# Patient Record
Sex: Male | Born: 1975 | Race: White | Hispanic: No | Marital: Married | Smoking: Former smoker
Health system: Southern US, Community
[De-identification: ages and names within clinical notes are randomized; demographics above are authoritative.]

## PROBLEM LIST (undated history)

## (undated) DIAGNOSIS — R569 Unspecified convulsions: Secondary | ICD-10-CM

## (undated) DIAGNOSIS — R1310 Dysphagia, unspecified: Secondary | ICD-10-CM

## (undated) DIAGNOSIS — I639 Cerebral infarction, unspecified: Secondary | ICD-10-CM

## (undated) DIAGNOSIS — S46009A Unspecified injury of muscle(s) and tendon(s) of the rotator cuff of unspecified shoulder, initial encounter: Secondary | ICD-10-CM

## (undated) DIAGNOSIS — S069X9A Unspecified intracranial injury with loss of consciousness of unspecified duration, initial encounter: Secondary | ICD-10-CM

## (undated) DIAGNOSIS — R51 Headache: Secondary | ICD-10-CM

## (undated) DIAGNOSIS — S069XAA Unspecified intracranial injury with loss of consciousness status unknown, initial encounter: Secondary | ICD-10-CM

---

## 2005-02-14 ENCOUNTER — Ambulatory Visit (HOSPITAL_COMMUNITY): Admission: RE | Admit: 2005-02-14 | Discharge: 2005-02-14 | Payer: Self-pay | Admitting: Otolaryngology

## 2005-02-14 ENCOUNTER — Ambulatory Visit (HOSPITAL_BASED_OUTPATIENT_CLINIC_OR_DEPARTMENT_OTHER): Admission: RE | Admit: 2005-02-14 | Discharge: 2005-02-14 | Payer: Self-pay | Admitting: Otolaryngology

## 2005-02-14 ENCOUNTER — Encounter (INDEPENDENT_AMBULATORY_CARE_PROVIDER_SITE_OTHER): Payer: Self-pay | Admitting: *Deleted

## 2005-03-09 ENCOUNTER — Encounter: Admission: RE | Admit: 2005-03-09 | Discharge: 2005-03-09 | Payer: Self-pay | Admitting: Family Medicine

## 2006-09-11 HISTORY — PX: WRIST SURGERY: SHX841

## 2007-04-01 ENCOUNTER — Encounter: Admission: RE | Admit: 2007-04-01 | Discharge: 2007-04-01 | Payer: Self-pay | Admitting: *Deleted

## 2007-07-16 ENCOUNTER — Ambulatory Visit (HOSPITAL_BASED_OUTPATIENT_CLINIC_OR_DEPARTMENT_OTHER): Admission: RE | Admit: 2007-07-16 | Discharge: 2007-07-16 | Payer: Self-pay | Admitting: Orthopedic Surgery

## 2011-01-24 NOTE — Op Note (Signed)
NAMENINA, HOAR                 ACCOUNT NO.:  [id number]   MEDICAL RECORD NO.:  [id number]          PATIENT TYPE:  AMB   LOCATION:  DSC                          FACILITY:  [facility]   PHYSICIAN:  Cindee Salt, M.D.       DATE OF BIRTH:  1976/01/03   DATE OF PROCEDURE:  07/16/2007  DATE OF DISCHARGE:                               OPERATIVE REPORT   PREOPERATIVE DIAGNOSIS:  Ulnocarpal abutment, scapholunate ligament  tear, right wrist.   POSTOPERATIVE DIAGNOSIS:  Ulnocarpal abutment, scapholunate ligament  tear, right wrist, articular cartilage avulsion ulnar lunate with  significant synovitis.   OPERATION:  Arthroscopy right wrist with shrinkage scapholunate  ligament, debridement with abrasion arthroplasty, lunate articular  cartilage avulsion, partial synovectomy, ulnar shortening osteotomy with  Tri-Med plate open, right forearm and wrist.   SURGEON:  Cindee Salt, M.D.   ANESTHESIA:  Axillary block.   HISTORY:  The patient is a 35 year old male with a history of pain in  his wrist ulnar side.  He has had an MRI done revealing a scapholunate  ligament disruption.  He has a long ulna with ulnar-sided wrist pain and  positive bone scan.  He has elected to proceed with arthroscopy,  debridement shrinkage, ulnar shortening osteotomy as an open procedure  in that his triangular fibrocartilage complex is intact.  He is aware  that there is no guarantee with the surgery, possibility of infection,  recurrence, injury to arteries, nerves, tendons, incomplete relief of  symptoms, dystrophy, nonunion, delayed union to the osteotomy site with  the necessity of further surgical intervention.  Pre, peri and  postoperative course have been discussed, questions encouraged and  answered.  In the preoperative area the patient is seen.  The extremity  marked by both the patient and surgeon.   PROCEDURE:  The patient is brought to the operating room where an  axillary block was carried out without  difficulty.  He was prepped using  DuraPrep, supine position, right arm free.  The limb was placed in the  arthroscopy tower, 10 pounds traction applied.  The joint inflated at  the 3/4 portal.  A transverse incision was made, deepened with a  hemostat.  A blunt trocar was used to enter the joint.  The joint was  inspected.  A small scapholunate ligament disruption was noted with some  patulous nature to the scapholunate ligament.  The volar radial wrist  ligaments were intact.  Cartilage showed no significant wear.  An  irrigation catheter was placed in 6U.  The scope was brought over to the  ulnar side.  A 4/5 portal was opened after localization with a 22 gauge  needle.  While the needle was in, it was noted that there was a large  avulsion cartilage from the ulnar aspect of the lunate.  This was  probed.  It did not involve the lunotriquetral ligament, but abutted to  this.  This was then debrided after the widening this portal with a  hemostat.  Blunt trocar used to enter the joint.  A full radius shaver  was then used  to debride the loose cartilage.  An abrasion arthroplasty  was performed.  A significant ulnar synovitis was present.  Triangular  fibrocartilage complex was intact.  The TFCC showed normal trampoline  effect.  The joint was then inspected from the 4/5 portal.  No further  lesions were identified.  A partial synovectomy was then performed with  a Arthrowand.  The scope reintroduced into the 3-4/4-5 portal  alternating with the Arthrowand.  A shrinkage of the scapholunate  ligament was done after debridement of the small tear.  The midcarpal  joint was inspected.  No gross instability was noted.  No articular  changes were present.  A type 2 lunate was present.  There were no  changes on the proximal hamate.  The scope was removed.  The limb was  then exsanguinated with an Esmarch bandage, tourniquet placed high on  the arm was inflated to 250 mmHg.  An incision was then  made over the  lateral border of the forearm, the juncture between the flexor carpi  ulnaris, extensor carpi ulnaris carried down through subcutaneous  tissue.  The dorsal sensory branch identified, the ulnar nerve was not  identified.  It was searched for, retractors were placed.  An incision  was then made down to the periosteum.  The flexor carpi ulnaris was then  elevated off, a Tri-Med plate was then fixed to the ulna.  This was  fixed with the static three screws, these each measured 14 mm.  The  gliding screw was then placed, the jig was then inserted.  The gliding  screw was also 14 mm. The rotation jig was then applied.  Pins placed to  stabilize it.  The osteotomy jig was then inserted and a 3 mm osteotomy  was then performed.  The removal of the bone fragment allowed  compression after placement of a lateral pin and placement of the  compression guide.  This allowed compression of the osteotomy site after  loosening the gliding screw.  This was then tightened.  The oblique  screw was then inserted.  This was found to be a 20-mm screw.  A  partially threaded compression screw was then placed.  This was firmly  snugged after loosening the gliding screw allowing the osteotomy plate  to glide proximally.  X-rays confirmed positioning of the osteotomy site  after tightening of the gliding screw to maintain the plate in position.  The remaining two screws were then placed.  These each measured 14 mm.  The osteotomy was done while cooling with constant irrigation of saline  to prevent any burning of bone.  X-rays confirmed good positioning of  the osteotomy site of the compression screw, with shortening of the ulna  to a 1 mm step off with a 1 mm shortening of the ulna as compared the  radius.  The wound was copiously irrigated with saline.  The rotation  pins were removed along with the compression device.  The periosteum was  sutured with 2-0 Vicryl suture along with the fascia, the  subcutaneous  tissue.  The skin was closed with interrupted 4-0 Vicryl Rapide sutures  as were the portals for the arthroscopy.  A sterile compressive dressing  and long-arm splint applied.  On deflation of the tourniquet, all  fingers immediately pinked.  He was taken to the recovery for  observation in satisfactory condition.  Bleeders were cauterized  throughout the procedure with bipolar.  The patient tolerated the  procedure well.  He will be  admitted for overnight stay for pain  control.  He will be discharged on Percocet.           ______________________________  Cindee Salt, M.D.     GK/MEDQ  D:  07/16/2007  T:  07/17/2007  Job:  [id number]

## 2011-01-27 NOTE — Op Note (Signed)
[name], [name]                 ACCOUNT NO.:  [id number]   MEDICAL RECORD NO.:  [id number]          PATIENT TYPE:  AMB   LOCATION:  DSC                          FACILITY:  [facility]   PHYSICIAN:  Christopher E. Ezzard Standing, M.D.DATE OF BIRTH:  1975/11/22   DATE OF PROCEDURE:  02/14/2005  DATE OF DISCHARGE:                                 OPERATIVE REPORT   PREOPERATIVE DIAGNOSIS:  Left vocal cord nodule/polyp.   POSTOPERATIVE DIAGNOSIS:  Left vocal cord nodule/polyp.   OPERATION/PROCEDURE:  Microlaryngoscopy with excision of left vocal cord  polyp.   SURGEON:  Dr. Narda Bonds.   ANESTHESIA:  General endotracheal.   COMPLICATIONS:  None.   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  On exam in the office, he has  an erythematous mucosal-covered polyp or nodule on the anterior left true  vocal cord.  He is taken to the operating room at this time for  microlaryngoscopy and excision of vocal cord nodule.   DESCRIPTION OF PROCEDURE:  After adequate endotracheal anesthesia, the  anterior commissure laryngoscope was used to visualize the larynx.  The base  of the tongue and epiglottis were normal in appearance.  On examination of  the vocal cords, [name] had a small mucosal-covered, erythematous nodule  involving the very anterior left true vocal cord.  The right true vocal cord  appeared normal.  Using cup forceps and scissors, the nodule was removed and  sent to pathology.  Hemostasis was obtained with cotton pledgets soaked in  Adrenalin.  Photos were obtained.  This completed the procedure.  [name]  was subsequently awoken from anesthesia and transferred to the recovery room  postoperatively doing well.   DISPOSITION:  [name] is discharged home later this morning on Tylenol and  Tylenol #3 p.r.n. pain.  He is given Nexium 40 mg daily for two weeks,  instructed on voice rest for the next two weeks and  will have him follow up  in my office in two to three weeks for recheck.      CEN/MEDQ  D:  02/14/2005  T:  02/14/2005  Job:  [id number]   cc:   Dellis Anes. Idell Pickles, M.D.  [address]  [place]  [place] [address]  Fax: [phone]

## 2011-06-20 LAB — POCT HEMOGLOBIN-HEMACUE
Hemoglobin: 16.2
Operator id: 116011

## 2012-12-14 ENCOUNTER — Inpatient Hospital Stay (HOSPITAL_COMMUNITY)
Admission: EM | Admit: 2012-12-14 | Discharge: 2013-01-03 | DRG: 877 | Disposition: A | Discharge status: Rehabilitation Facility | Payer: BC Managed Care – PPO | Attending: General Surgery | Admitting: General Surgery

## 2012-12-14 ENCOUNTER — Emergency Department (HOSPITAL_COMMUNITY): Payer: BC Managed Care – PPO

## 2012-12-14 DIAGNOSIS — S02402A Zygomatic fracture, unspecified, initial encounter for closed fracture: Secondary | ICD-10-CM

## 2012-12-14 DIAGNOSIS — S065XAA Traumatic subdural hemorrhage with loss of consciousness status unknown, initial encounter: Secondary | ICD-10-CM

## 2012-12-14 DIAGNOSIS — S0993XA Unspecified injury of face, initial encounter: Secondary | ICD-10-CM

## 2012-12-14 DIAGNOSIS — R780 Finding of alcohol in blood: Secondary | ICD-10-CM | POA: Diagnosis present

## 2012-12-14 DIAGNOSIS — E876 Hypokalemia: Secondary | ICD-10-CM | POA: Diagnosis present

## 2012-12-14 DIAGNOSIS — I4891 Unspecified atrial fibrillation: Secondary | ICD-10-CM | POA: Diagnosis not present

## 2012-12-14 DIAGNOSIS — S066X1A Traumatic subarachnoid hemorrhage with loss of consciousness of 30 minutes or less, initial encounter: Secondary | ICD-10-CM

## 2012-12-14 DIAGNOSIS — J14 Pneumonia due to Hemophilus influenzae: Secondary | ICD-10-CM | POA: Diagnosis not present

## 2012-12-14 DIAGNOSIS — I469 Cardiac arrest, cause unspecified: Secondary | ICD-10-CM | POA: Diagnosis present

## 2012-12-14 DIAGNOSIS — S098XXA Other specified injuries of head, initial encounter: Secondary | ICD-10-CM

## 2012-12-14 DIAGNOSIS — E872 Acidosis, unspecified: Secondary | ICD-10-CM | POA: Diagnosis present

## 2012-12-14 DIAGNOSIS — Y92009 Unspecified place in unspecified non-institutional (private) residence as the place of occurrence of the external cause: Secondary | ICD-10-CM

## 2012-12-14 DIAGNOSIS — S0280XA Fracture of other specified skull and facial bones, unspecified side, initial encounter for closed fracture: Secondary | ICD-10-CM | POA: Diagnosis present

## 2012-12-14 DIAGNOSIS — S0292XA Unspecified fracture of facial bones, initial encounter for closed fracture: Secondary | ICD-10-CM

## 2012-12-14 DIAGNOSIS — R7309 Other abnormal glucose: Secondary | ICD-10-CM | POA: Diagnosis present

## 2012-12-14 DIAGNOSIS — I609 Nontraumatic subarachnoid hemorrhage, unspecified: Secondary | ICD-10-CM

## 2012-12-14 DIAGNOSIS — E870 Hyperosmolality and hypernatremia: Secondary | ICD-10-CM | POA: Diagnosis present

## 2012-12-14 DIAGNOSIS — J1569 Pneumonia due to other gram-negative bacteria: Secondary | ICD-10-CM | POA: Diagnosis not present

## 2012-12-14 DIAGNOSIS — J156 Pneumonia due to other aerobic Gram-negative bacteria: Secondary | ICD-10-CM | POA: Diagnosis not present

## 2012-12-14 DIAGNOSIS — R4182 Altered mental status, unspecified: Secondary | ICD-10-CM

## 2012-12-14 DIAGNOSIS — S0219XA Other fracture of base of skull, initial encounter for closed fracture: Secondary | ICD-10-CM

## 2012-12-14 DIAGNOSIS — IMO0002 Reserved for concepts with insufficient information to code with codable children: Secondary | ICD-10-CM | POA: Diagnosis present

## 2012-12-14 DIAGNOSIS — D62 Acute posthemorrhagic anemia: Secondary | ICD-10-CM | POA: Diagnosis not present

## 2012-12-14 DIAGNOSIS — J189 Pneumonia, unspecified organism: Secondary | ICD-10-CM | POA: Diagnosis not present

## 2012-12-14 DIAGNOSIS — J95821 Acute postprocedural respiratory failure: Secondary | ICD-10-CM | POA: Diagnosis present

## 2012-12-14 DIAGNOSIS — G40401 Other generalized epilepsy and epileptic syndromes, not intractable, with status epilepticus: Secondary | ICD-10-CM | POA: Diagnosis not present

## 2012-12-14 DIAGNOSIS — S065X9A Traumatic subdural hemorrhage with loss of consciousness of unspecified duration, initial encounter: Principal | ICD-10-CM | POA: Diagnosis present

## 2012-12-14 DIAGNOSIS — S02401A Maxillary fracture, unspecified, initial encounter for closed fracture: Secondary | ICD-10-CM | POA: Diagnosis present

## 2012-12-14 DIAGNOSIS — R030 Elevated blood-pressure reading, without diagnosis of hypertension: Secondary | ICD-10-CM | POA: Diagnosis present

## 2012-12-14 DIAGNOSIS — Z87891 Personal history of nicotine dependence: Secondary | ICD-10-CM

## 2012-12-14 DIAGNOSIS — D72829 Elevated white blood cell count, unspecified: Secondary | ICD-10-CM | POA: Diagnosis not present

## 2012-12-14 DIAGNOSIS — S02400A Malar fracture unspecified, initial encounter for closed fracture: Secondary | ICD-10-CM | POA: Diagnosis present

## 2012-12-14 DIAGNOSIS — J13 Pneumonia due to Streptococcus pneumoniae: Secondary | ICD-10-CM | POA: Diagnosis not present

## 2012-12-14 DIAGNOSIS — F1010 Alcohol abuse, uncomplicated: Secondary | ICD-10-CM | POA: Diagnosis present

## 2012-12-14 DIAGNOSIS — S02109A Fracture of base of skull, unspecified side, initial encounter for closed fracture: Principal | ICD-10-CM | POA: Diagnosis present

## 2012-12-14 HISTORY — DX: Headache: R51

## 2012-12-14 LAB — POCT I-STAT, CHEM 8
BUN: 10 mg/dL (ref 6–23)
Calcium, Ion: 1.04 mmol/L — ABNORMAL LOW (ref 1.12–1.23)
Chloride: 101 mEq/L (ref 96–112)
Creatinine, Ser: 1.3 mg/dL (ref 0.50–1.35)
Glucose, Bld: 207 mg/dL — ABNORMAL HIGH (ref 70–99)
HCT: 48 % (ref 39.0–52.0)
Hemoglobin: 16.3 g/dL (ref 13.0–17.0)
Potassium: 2.9 mEq/L — ABNORMAL LOW (ref 3.5–5.1)
Sodium: 138 mEq/L (ref 135–145)
TCO2: 18 mmol/L (ref 0–100)

## 2012-12-14 LAB — CBC WITH DIFFERENTIAL/PLATELET
Basophils Absolute: 0.1 10*3/uL (ref 0.0–0.1)
Basophils Relative: 1 % (ref 0–1)
Eosinophils Absolute: 0.4 10*3/uL (ref 0.0–0.7)
Eosinophils Relative: 4 % (ref 0–5)
HCT: 43.9 % (ref 39.0–52.0)
Hemoglobin: 15.8 g/dL (ref 13.0–17.0)
Lymphocytes Relative: 40 % (ref 12–46)
Lymphs Abs: 4.1 10*3/uL — ABNORMAL HIGH (ref 0.7–4.0)
MCH: 29.6 pg (ref 26.0–34.0)
MCHC: 36 g/dL (ref 30.0–36.0)
MCV: 82.4 fL (ref 78.0–100.0)
Monocytes Absolute: 0.6 10*3/uL (ref 0.1–1.0)
Monocytes Relative: 6 % (ref 3–12)
Neutro Abs: 4.9 10*3/uL (ref 1.7–7.7)
Neutrophils Relative %: 49 % (ref 43–77)
Platelets: 251 10*3/uL (ref 150–400)
RBC: 5.33 MIL/uL (ref 4.22–5.81)
RDW: 12.6 % (ref 11.5–15.5)
WBC: 10.1 10*3/uL (ref 4.0–10.5)

## 2012-12-14 LAB — POCT I-STAT TROPONIN I: Troponin i, poc: 0.01 ng/mL (ref 0.00–0.08)

## 2012-12-14 LAB — CG4 I-STAT (LACTIC ACID): Lactic Acid, Venous: 6.24 mmol/L — ABNORMAL HIGH (ref 0.5–2.2)

## 2012-12-14 MED ORDER — LIDOCAINE HCL (CARDIAC) 20 MG/ML IV SOLN
1.0000 mg/kg | Freq: Once | INTRAVENOUS | Status: AC
  Administered 2012-12-14: 100 mg via INTRAVENOUS
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
## 2012-12-15 ENCOUNTER — Inpatient Hospital Stay (HOSPITAL_COMMUNITY): Payer: BC Managed Care – PPO

## 2012-12-15 ENCOUNTER — Encounter (HOSPITAL_COMMUNITY): Payer: Self-pay | Admitting: Anesthesiology

## 2012-12-15 ENCOUNTER — Emergency Department (HOSPITAL_COMMUNITY): Payer: BC Managed Care – PPO | Admitting: Anesthesiology

## 2012-12-15 ENCOUNTER — Encounter (HOSPITAL_COMMUNITY): Admission: EM | Disposition: A | Discharge status: Rehabilitation Facility | Payer: Self-pay | Source: Home / Self Care

## 2012-12-15 DIAGNOSIS — I6200 Nontraumatic subdural hemorrhage, unspecified: Secondary | ICD-10-CM

## 2012-12-15 DIAGNOSIS — S066X1A Traumatic subarachnoid hemorrhage with loss of consciousness of 30 minutes or less, initial encounter: Secondary | ICD-10-CM

## 2012-12-15 DIAGNOSIS — S098XXA Other specified injuries of head, initial encounter: Secondary | ICD-10-CM

## 2012-12-15 DIAGNOSIS — J95821 Acute postprocedural respiratory failure: Secondary | ICD-10-CM

## 2012-12-15 DIAGNOSIS — S0993XA Unspecified injury of face, initial encounter: Secondary | ICD-10-CM

## 2012-12-15 DIAGNOSIS — R780 Finding of alcohol in blood: Secondary | ICD-10-CM

## 2012-12-15 DIAGNOSIS — I609 Nontraumatic subarachnoid hemorrhage, unspecified: Secondary | ICD-10-CM

## 2012-12-15 DIAGNOSIS — R4182 Altered mental status, unspecified: Secondary | ICD-10-CM

## 2012-12-15 DIAGNOSIS — I469 Cardiac arrest, cause unspecified: Secondary | ICD-10-CM | POA: Diagnosis present

## 2012-12-15 DIAGNOSIS — S065X9A Traumatic subdural hemorrhage with loss of consciousness of unspecified duration, initial encounter: Secondary | ICD-10-CM

## 2012-12-15 HISTORY — PX: CRANIOTOMY: SHX93

## 2012-12-15 LAB — URINALYSIS, ROUTINE W REFLEX MICROSCOPIC
Bilirubin Urine: NEGATIVE
Glucose, UA: 100 mg/dL — AB
Ketones, ur: NEGATIVE mg/dL
Leukocytes, UA: NEGATIVE
Nitrite: NEGATIVE
Protein, ur: 100 mg/dL — AB
Specific Gravity, Urine: 1.011 (ref 1.005–1.030)
Urobilinogen, UA: 0.2 mg/dL (ref 0.0–1.0)
pH: 6 (ref 5.0–8.0)

## 2012-12-15 LAB — POCT I-STAT 3, ART BLOOD GAS (G3+)
Acid-base deficit: 8 mmol/L — ABNORMAL HIGH (ref 0.0–2.0)
Bicarbonate: 20.5 mEq/L (ref 20.0–24.0)
O2 Saturation: 100 %
Patient temperature: 98.7
TCO2: 22 mmol/L (ref 0–100)
pCO2 arterial: 51.3 mmHg — ABNORMAL HIGH (ref 35.0–45.0)
pH, Arterial: 7.21 — ABNORMAL LOW (ref 7.350–7.450)
pO2, Arterial: 422 mmHg — ABNORMAL HIGH (ref 80.0–100.0)

## 2012-12-15 LAB — COMPREHENSIVE METABOLIC PANEL
ALT: 65 U/L — ABNORMAL HIGH (ref 0–53)
AST: 86 U/L — ABNORMAL HIGH (ref 0–37)
Albumin: 4.3 g/dL (ref 3.5–5.2)
Alkaline Phosphatase: 44 U/L (ref 39–117)
BUN: 11 mg/dL (ref 6–23)
CO2: 16 mEq/L — ABNORMAL LOW (ref 19–32)
Calcium: 8.5 mg/dL (ref 8.4–10.5)
Chloride: 97 mEq/L (ref 96–112)
Creatinine, Ser: 0.88 mg/dL (ref 0.50–1.35)
GFR calc Af Amer: >90 mL/min (ref >90)
GFR calc non Af Amer: >90 mL/min (ref >90)
Glucose, Bld: 204 mg/dL — ABNORMAL HIGH (ref 70–99)
Potassium: 2.8 mEq/L — ABNORMAL LOW (ref 3.5–5.1)
Sodium: 134 mEq/L — ABNORMAL LOW (ref 135–145)
Total Bilirubin: 0.2 mg/dL — ABNORMAL LOW (ref 0.3–1.2)
Total Protein: 7.1 g/dL (ref 6.0–8.3)

## 2012-12-15 LAB — GLUCOSE, CAPILLARY
Glucose-Capillary: 112 mg/dL — ABNORMAL HIGH (ref 70–99)
Glucose-Capillary: 115 mg/dL — ABNORMAL HIGH (ref 70–99)
Glucose-Capillary: 125 mg/dL — ABNORMAL HIGH (ref 70–99)
Glucose-Capillary: 146 mg/dL — ABNORMAL HIGH (ref 70–99)
Glucose-Capillary: 150 mg/dL — ABNORMAL HIGH (ref 70–99)
Glucose-Capillary: 96 mg/dL (ref 70–99)

## 2012-12-15 LAB — BLOOD GAS, ARTERIAL
Acid-base deficit: 8.6 mmol/L — ABNORMAL HIGH (ref 0.0–2.0)
Bicarbonate: 16.2 mEq/L — ABNORMAL LOW (ref 20.0–24.0)
Drawn by: 36274
FIO2: 0.3 %
MECHVT: 600 mL
O2 Saturation: 97.8 %
PEEP: 5 cmH2O
Patient temperature: 98.6
RATE: 16 resp/min
TCO2: 17.2 mmol/L (ref 0–100)
pCO2 arterial: 32.3 mmHg — ABNORMAL LOW (ref 35.0–45.0)
pH, Arterial: 7.322 — ABNORMAL LOW (ref 7.350–7.450)
pO2, Arterial: 110 mmHg — ABNORMAL HIGH (ref 80.0–100.0)

## 2012-12-15 LAB — PROTIME-INR
INR: 1.15 (ref 0.00–1.49)
Prothrombin Time: 14.5 seconds (ref 11.6–15.2)

## 2012-12-15 LAB — CBC
HCT: 37.4 % — ABNORMAL LOW (ref 39.0–52.0)
Hemoglobin: 13.8 g/dL (ref 13.0–17.0)
MCH: 29.6 pg (ref 26.0–34.0)
MCHC: 36.9 g/dL — ABNORMAL HIGH (ref 30.0–36.0)
MCV: 80.1 fL (ref 78.0–100.0)
Platelets: 212 10*3/uL (ref 150–400)
RBC: 4.67 MIL/uL (ref 4.22–5.81)
RDW: 12.6 % (ref 11.5–15.5)
WBC: 14.1 10*3/uL — ABNORMAL HIGH (ref 4.0–10.5)

## 2012-12-15 LAB — BASIC METABOLIC PANEL
BUN: 10 mg/dL (ref 6–23)
CO2: 20 mEq/L (ref 19–32)
Calcium: 7.6 mg/dL — ABNORMAL LOW (ref 8.4–10.5)
Chloride: 102 mEq/L (ref 96–112)
Creatinine, Ser: 0.74 mg/dL (ref 0.50–1.35)
GFR calc Af Amer: >90 mL/min (ref >90)
GFR calc non Af Amer: >90 mL/min (ref >90)
Glucose, Bld: 111 mg/dL — ABNORMAL HIGH (ref 70–99)
Potassium: 4.4 mEq/L (ref 3.5–5.1)
Sodium: 134 mEq/L — ABNORMAL LOW (ref 135–145)

## 2012-12-15 LAB — CK TOTAL AND CKMB (NOT AT ARMC)
CK, MB: 2.7 ng/mL (ref 0.3–4.0)
CK, MB: 2.7 ng/mL (ref 0.3–4.0)
Relative Index: 1.4 (ref 0.0–2.5)
Relative Index: 1 (ref 0.0–2.5)
Total CK: 190 U/L (ref 7–232)
Total CK: 258 U/L — ABNORMAL HIGH (ref 7–232)

## 2012-12-15 LAB — RAPID URINE DRUG SCREEN, HOSP PERFORMED
Amphetamines: NOT DETECTED
Barbiturates: NOT DETECTED
Benzodiazepines: NOT DETECTED
Cocaine: NOT DETECTED
Opiates: NOT DETECTED
Tetrahydrocannabinol: NOT DETECTED

## 2012-12-15 LAB — APTT: aPTT: 26 seconds (ref 24–37)

## 2012-12-15 LAB — URINE MICROSCOPIC-ADD ON

## 2012-12-15 LAB — ABO/RH: ABO/RH(D): AB POS

## 2012-12-15 LAB — TROPONIN I
Troponin I: <0.3 ng/mL (ref <0.30)
Troponin I: <0.3 ng/mL (ref <0.30)

## 2012-12-15 LAB — ETHANOL: Alcohol, Ethyl (B): 368 mg/dL — ABNORMAL HIGH (ref 0–11)

## 2012-12-15 LAB — LACTIC ACID, PLASMA: Lactic Acid, Venous: 3.5 mmol/L — ABNORMAL HIGH (ref 0.5–2.2)

## 2012-12-15 SURGERY — CRANIOTOMY HEMATOMA EVACUATION SUBDURAL
Anesthesia: General | Site: Head | Laterality: Left | Wound class: Clean

## 2012-12-15 MED ORDER — LIDOCAINE HCL (CARDIAC) 20 MG/ML IV SOLN
INTRAVENOUS | Status: AC
  Filled 2012-12-15: qty 5

## 2012-12-15 MED ORDER — FENTANYL CITRATE 0.05 MG/ML IJ SOLN
100.0000 ug | INTRAMUSCULAR | Status: DC | PRN
  Administered 2012-12-15 – 2012-12-16 (×5): 100 ug via INTRAVENOUS
  Filled 2012-12-15 (×5): qty 2

## 2012-12-15 MED ORDER — THROMBIN 5000 UNITS EX SOLR
OROMUCOSAL | Status: DC | PRN
  Administered 2012-12-15 (×2): via TOPICAL

## 2012-12-15 MED ORDER — ETOMIDATE 2 MG/ML IV SOLN
INTRAVENOUS | Status: AC
  Filled 2012-12-15: qty 20

## 2012-12-15 MED ORDER — SUCCINYLCHOLINE CHLORIDE 20 MG/ML IJ SOLN
INTRAMUSCULAR | Status: AC
  Filled 2012-12-15: qty 1

## 2012-12-15 MED ORDER — MANNITOL 25 % IV SOLN
INTRAVENOUS | Status: DC | PRN
Start: 2012-12-15 — End: 2012-12-15
  Administered 2012-12-15: 25 g via INTRAVENOUS

## 2012-12-15 MED ORDER — CHLORHEXIDINE GLUCONATE 0.12 % MT SOLN
15.0000 mL | Freq: Two times a day (BID) | OROMUCOSAL | Status: DC
  Administered 2012-12-15 – 2013-01-03 (×40): 15 mL via OROMUCOSAL
  Filled 2012-12-15 (×43): qty 15

## 2012-12-15 MED ORDER — MIDAZOLAM HCL 5 MG/5ML IJ SOLN
INTRAMUSCULAR | Status: DC | PRN
  Administered 2012-12-15: 2 mg via INTRAVENOUS

## 2012-12-15 MED ORDER — SODIUM CHLORIDE 0.9 % IV SOLN
INTRAVENOUS | Status: DC | PRN
  Administered 2012-12-15 (×2): via INTRAVENOUS

## 2012-12-15 MED ORDER — MIDAZOLAM HCL 2 MG/2ML IJ SOLN
2.0000 mg | INTRAMUSCULAR | Status: DC | PRN
  Administered 2012-12-15 (×2): 2 mg via INTRAVENOUS
  Filled 2012-12-15 (×2): qty 2

## 2012-12-15 MED ORDER — 0.9 % SODIUM CHLORIDE (POUR BTL) OPTIME
TOPICAL | Status: DC | PRN
  Administered 2012-12-15 (×2): 1000 mL

## 2012-12-15 MED ORDER — BIOTENE DRY MOUTH MT LIQD
15.0000 mL | Freq: Four times a day (QID) | OROMUCOSAL | Status: DC
  Administered 2012-12-15 – 2013-01-03 (×76): 15 mL via OROMUCOSAL

## 2012-12-15 MED ORDER — FENTANYL CITRATE 0.05 MG/ML IJ SOLN
INTRAMUSCULAR | Status: AC
  Filled 2012-12-15: qty 2

## 2012-12-15 MED ORDER — PROPOFOL 10 MG/ML IV EMUL
5.0000 ug/kg/min | INTRAVENOUS | Status: DC
  Filled 2012-12-15: qty 100

## 2012-12-15 MED ORDER — HYDRALAZINE HCL 20 MG/ML IJ SOLN
10.0000 mg | INTRAMUSCULAR | Status: DC | PRN
  Administered 2012-12-15: 10 mg via INTRAVENOUS
  Administered 2012-12-15: 22:00:00 via INTRAVENOUS
  Administered 2012-12-21 – 2012-12-22 (×7): 10 mg via INTRAVENOUS
  Filled 2012-12-15 (×11): qty 1

## 2012-12-15 MED ORDER — SODIUM CHLORIDE 0.9 % IV SOLN
INTRAVENOUS | Status: DC
  Administered 2012-12-15: 100 mL/h via INTRAVENOUS
  Administered 2012-12-15 – 2012-12-17 (×3): via INTRAVENOUS

## 2012-12-15 MED ORDER — ONDANSETRON HCL 4 MG PO TABS: 4.0000 mg | ORAL_TABLET | ORAL | Status: DC | PRN

## 2012-12-15 MED ORDER — ROCURONIUM BROMIDE 50 MG/5ML IV SOLN
INTRAVENOUS | Status: AC
  Filled 2012-12-15: qty 2

## 2012-12-15 MED ORDER — ONDANSETRON HCL 4 MG/2ML IJ SOLN: 4.0000 mg | INTRAMUSCULAR | Status: DC | PRN

## 2012-12-15 MED ORDER — SODIUM CHLORIDE 0.9 % IV SOLN
500.0000 mg | Freq: Two times a day (BID) | INTRAVENOUS | Status: DC
  Administered 2012-12-15 – 2012-12-21 (×14): 500 mg via INTRAVENOUS
  Filled 2012-12-15 (×16): qty 5

## 2012-12-15 MED ORDER — CEFAZOLIN SODIUM-DEXTROSE 2-3 GM-% IV SOLR
INTRAVENOUS | Status: DC | PRN
  Administered 2012-12-15: 2 g via INTRAVENOUS

## 2012-12-15 MED ORDER — PROPOFOL 10 MG/ML IV EMUL
5.0000 ug/kg/min | INTRAVENOUS | Status: DC
  Administered 2012-12-16 (×2): 15 ug/kg/min via INTRAVENOUS
  Administered 2012-12-16 (×2): 20 ug/kg/min via INTRAVENOUS
  Administered 2012-12-16: 30 ug/kg/min via INTRAVENOUS
  Filled 2012-12-15 (×5): qty 100

## 2012-12-15 MED ORDER — LABETALOL HCL 5 MG/ML IV SOLN
10.0000 mg | INTRAVENOUS | Status: DC | PRN
  Administered 2012-12-15: 20 mg via INTRAVENOUS
  Administered 2012-12-15: 10 mg via INTRAVENOUS
  Administered 2012-12-15 (×5): 20 mg via INTRAVENOUS
  Administered 2012-12-15: 10 mg via INTRAVENOUS
  Administered 2012-12-16 – 2012-12-21 (×12): 20 mg via INTRAVENOUS
  Administered 2012-12-22 (×2): 40 mg via INTRAVENOUS
  Administered 2012-12-22 – 2012-12-23 (×4): 20 mg via INTRAVENOUS
  Filled 2012-12-15 (×2): qty 4
  Filled 2012-12-15: qty 8
  Filled 2012-12-15: qty 4
  Filled 2012-12-15: qty 8
  Filled 2012-12-15 (×5): qty 4
  Filled 2012-12-15: qty 8
  Filled 2012-12-15 (×12): qty 4
  Filled 2012-12-15: qty 8

## 2012-12-15 MED ORDER — THROMBIN 20000 UNITS EX KIT
PACK | CUTANEOUS | Status: DC | PRN
  Administered 2012-12-15: 02:00:00 via TOPICAL

## 2012-12-15 MED ORDER — LIDOCAINE-EPINEPHRINE 1 %-1:100000 IJ SOLN
INTRAMUSCULAR | Status: DC | PRN
  Administered 2012-12-15: 18 mL via INTRADERMAL

## 2012-12-15 MED ORDER — PROPOFOL INFUSION 10 MG/ML OPTIME
INTRAVENOUS | Status: DC | PRN
  Administered 2012-12-15: 50 ug/kg/min via INTRAVENOUS

## 2012-12-15 MED ORDER — FENTANYL CITRATE 0.05 MG/ML IJ SOLN
INTRAMUSCULAR | Status: DC | PRN
  Administered 2012-12-15 (×2): 100 ug via INTRAVENOUS
  Administered 2012-12-15: 50 ug via INTRAVENOUS

## 2012-12-15 MED ORDER — CEFAZOLIN SODIUM 1-5 GM-% IV SOLN
1.0000 g | Freq: Three times a day (TID) | INTRAVENOUS | Status: AC
  Administered 2012-12-15 (×2): 1 g via INTRAVENOUS
  Filled 2012-12-15 (×2): qty 50

## 2012-12-15 MED ORDER — PANTOPRAZOLE SODIUM 40 MG IV SOLR
40.0000 mg | Freq: Every day | INTRAVENOUS | Status: DC
  Administered 2012-12-15 – 2012-12-24 (×10): 40 mg via INTRAVENOUS
  Filled 2012-12-15 (×11): qty 40

## 2012-12-15 MED ORDER — BACITRACIN ZINC 500 UNIT/GM EX OINT
TOPICAL_OINTMENT | CUTANEOUS | Status: DC | PRN
  Administered 2012-12-15: 1 via TOPICAL

## 2012-12-15 MED ORDER — ROCURONIUM BROMIDE 100 MG/10ML IV SOLN
INTRAVENOUS | Status: DC | PRN
  Administered 2012-12-15 (×3): 50 mg via INTRAVENOUS

## 2012-12-15 MED ORDER — INSULIN ASPART 100 UNIT/ML ~~LOC~~ SOLN
0.0000 [IU] | SUBCUTANEOUS | Status: DC
Start: 2012-12-15 — End: 2013-01-03
  Administered 2012-12-15 – 2012-12-16 (×4): 2 [IU] via SUBCUTANEOUS
  Administered 2012-12-16: 3 [IU] via SUBCUTANEOUS
  Administered 2012-12-16 – 2012-12-17 (×8): 2 [IU] via SUBCUTANEOUS
  Administered 2012-12-17: 3 [IU] via SUBCUTANEOUS
  Administered 2012-12-18 (×3): 2 [IU] via SUBCUTANEOUS
  Administered 2012-12-18 (×2): 3 [IU] via SUBCUTANEOUS
  Administered 2012-12-19 (×3): 2 [IU] via SUBCUTANEOUS
  Administered 2012-12-19 – 2012-12-20 (×3): 3 [IU] via SUBCUTANEOUS
  Administered 2012-12-20 (×4): 2 [IU] via SUBCUTANEOUS
  Administered 2012-12-21 (×4): 3 [IU] via SUBCUTANEOUS
  Administered 2012-12-21 (×2): 2 [IU] via SUBCUTANEOUS
  Administered 2012-12-22: 3 [IU] via SUBCUTANEOUS
  Administered 2012-12-22: 2 [IU] via SUBCUTANEOUS
  Administered 2012-12-22 (×2): 3 [IU] via SUBCUTANEOUS
  Administered 2012-12-23: 2 [IU] via SUBCUTANEOUS
  Administered 2012-12-23 (×2): 3 [IU] via SUBCUTANEOUS
  Administered 2012-12-23 – 2012-12-24 (×5): 2 [IU] via SUBCUTANEOUS
  Administered 2012-12-25 (×3): 3 [IU] via SUBCUTANEOUS
  Administered 2012-12-25 – 2012-12-26 (×3): 2 [IU] via SUBCUTANEOUS
  Administered 2012-12-26: 3 [IU] via SUBCUTANEOUS
  Administered 2012-12-26 – 2012-12-27 (×8): 2 [IU] via SUBCUTANEOUS
  Administered 2012-12-27 – 2012-12-28 (×2): 3 [IU] via SUBCUTANEOUS
  Administered 2012-12-28 – 2013-01-02 (×11): 2 [IU] via SUBCUTANEOUS
  Administered 2013-01-02: 3 [IU] via SUBCUTANEOUS
  Administered 2013-01-03: 2 [IU] via SUBCUTANEOUS
  Administered 2013-01-03: 09:00:00 via SUBCUTANEOUS
  Administered 2013-01-03 (×2): 2 [IU] via SUBCUTANEOUS

## 2012-12-15 MED ORDER — PROMETHAZINE HCL 25 MG PO TABS: 12.5000 mg | ORAL_TABLET | ORAL | Status: DC | PRN

## 2012-12-15 SURGICAL SUPPLY — 60 items
BANDAGE GAUZE 4  KLING STR (GAUZE/BANDAGES/DRESSINGS) ×2 IMPLANT
BANDAGE GAUZE ELAST BULKY 4 IN (GAUZE/BANDAGES/DRESSINGS) ×2 IMPLANT
BIT DRILL WIRE PASS 1.3MM (BIT) IMPLANT
BLADE SURG ROTATE 9660 (MISCELLANEOUS) ×1 IMPLANT
BRUSH SCRUB EZ PLAIN DRY (MISCELLANEOUS) ×1 IMPLANT
BUR ACORN 6.0 PRECISION (BURR) ×2 IMPLANT
BUR ROUTER D-58 CRANI (BURR) ×1 IMPLANT
CANISTER SUCTION 2500CC (MISCELLANEOUS) ×2 IMPLANT
CLOTH BEACON ORANGE TIMEOUT ST (SAFETY) ×2 IMPLANT
CONT SPEC 4OZ CLIKSEAL STRL BL (MISCELLANEOUS) ×2 IMPLANT
CORDS BIPOLAR (ELECTRODE) ×2 IMPLANT
DRAIN SNY WOU 7FLT (WOUND CARE) IMPLANT
DRAPE SURG IRRIG POUCH 19X23 (DRAPES) IMPLANT
DRAPE WARM FLUID 44X44 (DRAPE) ×2 IMPLANT
DRILL WIRE PASS 1.3MM (BIT)
DRSG PAD ABDOMINAL 8X10 ST (GAUZE/BANDAGES/DRESSINGS) IMPLANT
DURAFORM SPONGE 2X2 SINGLE (Neuro Prosthesis/Implant) ×1 IMPLANT
DURAPREP 6ML APPLICATOR 50/CS (WOUND CARE) ×1 IMPLANT
ELECT CAUTERY BLADE 6.4 (BLADE) ×2 IMPLANT
ELECT REM PT RETURN 9FT ADLT (ELECTROSURGICAL) ×2
ELECTRODE REM PT RTRN 9FT ADLT (ELECTROSURGICAL) ×1 IMPLANT
EVACUATOR 1/8 PVC DRAIN (DRAIN) IMPLANT
EVACUATOR SILICONE 100CC (DRAIN) IMPLANT
GAUZE SPONGE 4X4 16PLY XRAY LF (GAUZE/BANDAGES/DRESSINGS) IMPLANT
GLOVE BIOGEL M 7.0 STRL (GLOVE) ×3 IMPLANT
GLOVE BIOGEL M 8.0 STRL (GLOVE) ×3 IMPLANT
GLOVE EXAM NITRILE LRG STRL (GLOVE) IMPLANT
GLOVE EXAM NITRILE MD LF STRL (GLOVE) IMPLANT
GLOVE EXAM NITRILE XL STR (GLOVE) IMPLANT
GLOVE EXAM NITRILE XS STR PU (GLOVE) IMPLANT
GLOVE INDICATOR 7.5 STRL GRN (GLOVE) ×2 IMPLANT
GOWN BRE IMP SLV AUR LG STRL (GOWN DISPOSABLE) ×4 IMPLANT
GOWN BRE IMP SLV AUR XL STRL (GOWN DISPOSABLE) IMPLANT
GOWN STRL REIN 2XL LVL4 (GOWN DISPOSABLE) IMPLANT
HEMOSTAT SURGICEL 2X14 (HEMOSTASIS) ×1 IMPLANT
HOOK DURA (MISCELLANEOUS) ×2 IMPLANT
KIT BASIN OR (CUSTOM PROCEDURE TRAY) ×2 IMPLANT
KIT ROOM TURNOVER OR (KITS) ×2 IMPLANT
NS IRRIG 1000ML POUR BTL (IV SOLUTION) ×3 IMPLANT
PACK CRANIOTOMY (CUSTOM PROCEDURE TRAY) ×2 IMPLANT
PAD ARMBOARD 7.5X6 YLW CONV (MISCELLANEOUS) ×4 IMPLANT
PATTIES SURGICAL .5 X.5 (GAUZE/BANDAGES/DRESSINGS) IMPLANT
PATTIES SURGICAL .5 X3 (DISPOSABLE) IMPLANT
PATTIES SURGICAL 1X1 (DISPOSABLE) IMPLANT
PIN MAYFIELD SKULL DISP (PIN) IMPLANT
SPONGE GAUZE 4X4 12PLY (GAUZE/BANDAGES/DRESSINGS) ×2 IMPLANT
SPONGE NEURO XRAY DETECT 1X3 (DISPOSABLE) IMPLANT
SPONGE SURGIFOAM ABS GEL 100 (HEMOSTASIS) ×2 IMPLANT
STAPLER SKIN PROX WIDE 3.9 (STAPLE) ×2 IMPLANT
SUT NURALON 4 0 TR CR/8 (SUTURE) ×4 IMPLANT
SUT VIC AB 2-0 CP2 18 (SUTURE) ×8 IMPLANT
SYR 20ML ECCENTRIC (SYRINGE) ×2 IMPLANT
TAPE CLOTH 1X10 TAN NS (GAUZE/BANDAGES/DRESSINGS) ×1 IMPLANT
TAPE CLOTH SURG 4X10 WHT LF (GAUZE/BANDAGES/DRESSINGS) ×1 IMPLANT
TOWEL OR 17X24 6PK STRL BLUE (TOWEL DISPOSABLE) ×2 IMPLANT
TOWEL OR 17X26 10 PK STRL BLUE (TOWEL DISPOSABLE) ×2 IMPLANT
TRAY FOLEY CATH 14FRSI W/METER (CATHETERS) IMPLANT
TUBE CONNECTING 12X1/4 (SUCTIONS) ×2 IMPLANT
UNDERPAD 30X30 INCONTINENT (UNDERPADS AND DIAPERS) ×1 IMPLANT
WATER STERILE IRR 1000ML POUR (IV SOLUTION) ×2 IMPLANT

## 2012-12-15 NOTE — Progress Notes (Signed)
Called to trauma a for cpr. [name] and friend arrived and Chaplain took them to consult rm a. Dr spoke w/family shortly after their arrival and told them of pt's bleeding on the brain and that it did not look good. Pt's mother and father were very tearful. [name] was very much in denial and kept saying she would not accept this. Pt was taken to surgery. Continued to provide pastoral presence and emotional support for family. Marjory Lies Chaplain
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
## 2012-12-15 NOTE — ED Notes (Signed)
MD at bedside. Neuro- trauma paged per his request by Dr. Dierdre Highman

## 2012-12-15 NOTE — Progress Notes (Signed)
Patient ID: Albert [name], male   DOB: 10-29-1975, 37 y.o.   MRN: [id number] Sedated. CCM helping with his care. For angio in am

## 2012-12-15 NOTE — Progress Notes (Signed)
Patient ID: Albert [name], male   DOB: 1975/09/23, 37 y.o.   MRN: [id number] Op note [phone]

## 2012-12-15 NOTE — Anesthesia Postprocedure Evaluation (Signed)
  Anesthesia Post-op Note  Patient: Albert [name]  Procedure(s) Performed: Procedure(s): CRANIECTOMY HEMATOMA EVACUATION SUBDURAL WITH PLACEMENT OF BONE FLAP IN ABDOMINAL WALL (Left)  Patient Location: PACU and NICU  Anesthesia Type:General  Level of Consciousness: sedated  Airway and Oxygen Therapy: Patient Spontanous Breathing  Post-op Pain: mild  Post-op Assessment: Post-op Vital signs reviewed  Post-op Vital Signs: Reviewed  Complications: No apparent anesthesia complications

## 2012-12-15 NOTE — Anesthesia Preprocedure Evaluation (Addendum)
Anesthesia Evaluation  Patient identified by MRN, date of birth, ID bandGeneral Assessment Comment:Emergency surgery  Per Dr. Jeral Fruit.  Reviewed: Allergy & Precautions, H&P , Patient's Chart, lab work & pertinent test results  Airway      Comment: Intubated from ER. Dental   Pulmonary          Cardiovascular     Neuro/Psych    GI/Hepatic   Endo/Other    Renal/GU      Musculoskeletal   Abdominal   Peds  Hematology   Anesthesia Other Findings   Reproductive/Obstetrics                          Anesthesia Physical Anesthesia Plan  ASA: IV and emergent  Anesthesia Plan: General   Post-op Pain Management:    Induction: Intravenous and Inhalational  Airway Management Planned:   Additional Equipment:   Intra-op Plan:   Post-operative Plan: Post-operative intubation/ventilation  Informed Consent:   Plan Discussed with: CRNA, Anesthesiologist and Surgeon  Anesthesia Plan Comments: (ETT in situ)       Anesthesia Quick Evaluation

## 2012-12-15 NOTE — ED Notes (Signed)
MD at bedside. Neuro showing wife CT results.

## 2012-12-15 NOTE — Progress Notes (Signed)
ETT advanced to 25 cm @ the lip per MD order.

## 2012-12-15 NOTE — Transfer of Care (Signed)
Immediate Anesthesia Transfer of Care Note  Patient: [facility]  Procedure(s) Performed: Procedure(s): CRANIECTOMY HEMATOMA EVACUATION SUBDURAL WITH PLACEMENT OF BONE FLAP IN ABDOMINAL WALL (Left)  Patient Location: NICU  Anesthesia Type:General  Level of Consciousness: sedated, unresponsive and Patient remains intubated per anesthesia plan  Airway & Oxygen Therapy: Patient remains intubated per anesthesia plan and Patient placed on Ventilator (see vital sign flow sheet for setting)  Post-op Assessment: Post -op Vital signs reviewed and stable  Post vital signs: Reviewed and stable  Complications: No apparent anesthesia complications

## 2012-12-15 NOTE — H&P (Signed)
Albert [name] is an 37 y.o. male.   Chief Complaint: fell HPI: 37 y/o male, was out drinking with friends. Getting off the car he fell and according to a friend he was unresponsive unable to feel pulses, 911 was called and reesucityated. Intubated and a ct head was done in the er  No past medical history on file.  No past surgical history on file.  No family history on file. Social History:  has no tobacco, alcohol, and drug history on file.  Allergies: No Known Allergies   (Not in a hospital admission)  Results for orders placed during the hospital encounter of 12/14/12 (from the past 48 hour(s))  CBC WITH DIFFERENTIAL     Status: Abnormal   Collection Time    12/14/12 11:37 PM      Result Value Range   WBC 10.1  4.0 - 10.5 K/uL   RBC 5.33  4.22 - 5.81 MIL/uL   Hemoglobin 15.8  13.0 - 17.0 g/dL   HCT 81.1  91.4 - 78.2 %   MCV 82.4  78.0 - 100.0 fL   MCH 29.6  26.0 - 34.0 pg   MCHC 36.0  30.0 - 36.0 g/dL   RDW 95.6  21.3 - 08.6 %   Platelets 251  150 - 400 K/uL   Neutrophils Relative 49  43 - 77 %   Neutro Abs 4.9  1.7 - 7.7 K/uL   Lymphocytes Relative 40  12 - 46 %   Lymphs Abs 4.1 (*) 0.7 - 4.0 K/uL   Monocytes Relative 6  3 - 12 %   Monocytes Absolute 0.6  0.1 - 1.0 K/uL   Eosinophils Relative 4  0 - 5 %   Eosinophils Absolute 0.4  0.0 - 0.7 K/uL   Basophils Relative 1  0 - 1 %   Basophils Absolute 0.1  0.0 - 0.1 K/uL  ETHANOL     Status: Abnormal   Collection Time    12/14/12 11:37 PM      Result Value Range   Alcohol, Ethyl (B) 368 (*) 0 - 11 mg/dL   Comment:            LOWEST DETECTABLE LIMIT FOR     SERUM ALCOHOL IS 11 mg/dL     FOR MEDICAL PURPOSES ONLY  COMPREHENSIVE METABOLIC PANEL     Status: Abnormal   Collection Time    12/14/12 11:37 PM      Result Value Range   Sodium 134 (*) 135 - 145 mEq/L   Potassium 2.8 (*) 3.5 - 5.1 mEq/L   Chloride 97  96 - 112 mEq/L   CO2 16 (*) 19 - 32 mEq/L   Glucose, Bld 204 (*) 70 - 99 mg/dL   BUN 11  6 - 23  mg/dL   Creatinine, Ser 5.78  0.50 - 1.35 mg/dL   Calcium 8.5  8.4 - 46.9 mg/dL   Total Protein 7.1  6.0 - 8.3 g/dL   Albumin 4.3  3.5 - 5.2 g/dL   AST 86 (*) 0 - 37 U/L   ALT 65 (*) 0 - 53 U/L   Alkaline Phosphatase 44  39 - 117 U/L   Total Bilirubin 0.2 (*) 0.3 - 1.2 mg/dL   GFR calc non Af Amer >90  >90 mL/min   GFR calc Af Amer >90  >90 mL/min   Comment:            The eGFR has been calculated  using the CKD EPI equation.     This calculation has not been     validated in all clinical     situations.     eGFR's persistently     <90 mL/min signify     possible Chronic Kidney Disease.  POCT I-STAT TROPONIN I     Status: None   Collection Time    12/14/12 11:46 PM      Result Value Range   Troponin i, poc 0.01  0.00 - 0.08 ng/mL   Comment 3            Comment: Due to the release kinetics of cTnI,     a negative result within the first hours     of the onset of symptoms does not rule out     myocardial infarction with certainty.     If myocardial infarction is still suspected,     repeat the test at appropriate intervals.  POCT I-STAT, CHEM 8     Status: Abnormal   Collection Time    12/14/12 11:48 PM      Result Value Range   Sodium 138  135 - 145 mEq/L   Potassium 2.9 (*) 3.5 - 5.1 mEq/L   Chloride 101  96 - 112 mEq/L   BUN 10  6 - 23 mg/dL   Creatinine, Ser 1.61  0.50 - 1.35 mg/dL   Glucose, Bld 096 (*) 70 - 99 mg/dL   Calcium, Ion 0.45 (*) 1.12 - 1.23 mmol/L   TCO2 18  0 - 100 mmol/L   Hemoglobin 16.3  13.0 - 17.0 g/dL   HCT 40.9  81.1 - 91.4 %  CG4 I-STAT (LACTIC ACID)     Status: Abnormal   Collection Time    12/14/12 11:49 PM      Result Value Range   Lactic Acid, Venous 6.24 (*) 0.5 - 2.2 mmol/L  POCT I-STAT 3, BLOOD GAS (G3+)     Status: Abnormal   Collection Time    12/15/12 12:27 AM      Result Value Range   pH, Arterial 7.210 (*) 7.350 - 7.450   pCO2 arterial 51.3 (*) 35.0 - 45.0 mmHg   pO2, Arterial 422.0 (*) 80.0 - 100.0 mmHg   Bicarbonate  20.5  20.0 - 24.0 mEq/L   TCO2 22  0 - 100 mmol/L   O2 Saturation 100.0     Acid-base deficit 8.0 (*) 0.0 - 2.0 mmol/L   Patient temperature 98.7 F     Collection site RADIAL, ALLEN'S TEST ACCEPTABLE     Drawn by RT     Sample type ARTERIAL     Dg Chest 1 View  12/15/2012  *RADIOLOGY REPORT*  Clinical Data: Trauma the patient fell and hit head.  Cardiac arrest post intubation and CPR.  CHEST - 1 VIEW  Comparison: None.  Findings: Endotracheal tube has been placed with tip 3.3 cm above the carina.  Visualization of chest is limited due to backboard artifact and superimposed structures.  Shallow inspiration. Borderline heart size and pulmonary vascularity may be normal for technique.  Suggestion of focal infiltration or atelectasis in the left mid lung.  No blunting of costophrenic angles.  No pneumothorax.  Visualized ribs are not grossly displaced.  IMPRESSION: Shallow inspiration.  Possible focal atelectasis or infiltration in the left mid lung.  Endotracheal tube tip is 3.3 cm above the carina.   Original Report Authenticated By: Burman Nieves, M.D.    Ct Head Wo Contrast  12/15/2012  *RADIOLOGY REPORT*  Clinical Data:  Tripped and fell on face.  Cardiac arrest.  CT HEAD WITHOUT CONTRAST CT MAXILLOFACIAL WITHOUT CONTRAST CT CERVICAL SPINE WITHOUT CONTRAST  Technique:  Multidetector CT imaging of the head, cervical spine, and maxillofacial structures were performed using the standard protocol without intravenous contrast. Multiplanar CT image reconstructions of the cervical spine and maxillofacial structures were also generated.  Comparison:   None  CT HEAD  Findings: There is a large left sided subdural hematoma, extending from the frontal region through the temporal region into the parietal lobe.  Subdural measures a maximum of 7 mm.  There is significant left right shift, measuring 14 mm.  There is diffuse subarachnoid hemorrhage which appears symmetric bilaterally, involving the sylvian fissures,  basilar cisterns, and sulci bilaterally.  Significant cerebral edema is suspected.  Skull fracture extends from the left temporal bone inferiorly to involve the sphenoid bone and superiorly to the vertex.  Along the superior aspect of this, there is significant scalp hematoma. There is a fracture of the zygomatic arch, lateral wall of the left orbit, lateral wall of the maxillary sinus.  The orbits appear intact.  There is blood within the sphenoid sinus.  IMPRESSION:  1.  Large left subdural hematoma. 2.  Significant left to right midline shift. 3.  Significant subarachnoid hemorrhage, raising the question of aneurysm.  CT MAXILLOFACIAL  Findings:  There are fractures involving the left temporal bone, extending into the sphenoid bone and sphenoid air cell.  There are fractures of the left zygomatic arch, lateral wall of the left orbit, lateral wall of the left maxillary sinus.  These appear minimally displaced.  No definite pneumocephalus identified.  The globes are intact.  There is opacification of the left sphenoid air cell with lead.  There is a small amount flow within the ethmoid air cells.  Small air-fluid level is identified within the right maxillary sinus.  IMPRESSION:  1.  Numerous fractures as described.  2.  Blood within the left sphenoid air cell. 3.  Globes are intact.  CT CERVICAL SPINE  Findings:   The patient is intubated. There is loss of cervical lordosis.  This may be secondary to splinting, soft tissue injury, or positioning.  There is no evidence for acute fracture.  Images of the lung apices show atelectasis or contusion.  IMPRESSION:  1.  No evidence for acute fracture. 2.  Loss of lordosis.  See above. 3.  Densities at the bilateral lung apices.  Critical test results telephoned to Dr.  Norlene Campbell at the time of interpretation on date 12/15/2012 at time 12:05 a.m.   Original Report Authenticated By: Norva Pavlov, M.D.    Ct Cervical Spine Wo Contrast  12/15/2012  *RADIOLOGY REPORT*   Clinical Data:  Tripped and fell on face.  Cardiac arrest.  CT HEAD WITHOUT CONTRAST CT MAXILLOFACIAL WITHOUT CONTRAST CT CERVICAL SPINE WITHOUT CONTRAST  Technique:  Multidetector CT imaging of the head, cervical spine, and maxillofacial structures were performed using the standard protocol without intravenous contrast. Multiplanar CT image reconstructions of the cervical spine and maxillofacial structures were also generated.  Comparison:   None  CT HEAD  Findings: There is a large left sided subdural hematoma, extending from the frontal region through the temporal region into the parietal lobe.  Subdural measures a maximum of 7 mm.  There is significant left right shift, measuring 14 mm.  There is diffuse subarachnoid hemorrhage which appears symmetric bilaterally, involving the sylvian fissures, basilar cisterns, and  sulci bilaterally.  Significant cerebral edema is suspected.  Skull fracture extends from the left temporal bone inferiorly to involve the sphenoid bone and superiorly to the vertex.  Along the superior aspect of this, there is significant scalp hematoma. There is a fracture of the zygomatic arch, lateral wall of the left orbit, lateral wall of the maxillary sinus.  The orbits appear intact.  There is blood within the sphenoid sinus.  IMPRESSION:  1.  Large left subdural hematoma. 2.  Significant left to right midline shift. 3.  Significant subarachnoid hemorrhage, raising the question of aneurysm.  CT MAXILLOFACIAL  Findings:  There are fractures involving the left temporal bone, extending into the sphenoid bone and sphenoid air cell.  There are fractures of the left zygomatic arch, lateral wall of the left orbit, lateral wall of the left maxillary sinus.  These appear minimally displaced.  No definite pneumocephalus identified.  The globes are intact.  There is opacification of the left sphenoid air cell with lead.  There is a small amount flow within the ethmoid air cells.  Small air-fluid level  is identified within the right maxillary sinus.  IMPRESSION:  1.  Numerous fractures as described.  2.  Blood within the left sphenoid air cell. 3.  Globes are intact.  CT CERVICAL SPINE  Findings:   The patient is intubated. There is loss of cervical lordosis.  This may be secondary to splinting, soft tissue injury, or positioning.  There is no evidence for acute fracture.  Images of the lung apices show atelectasis or contusion.  IMPRESSION:  1.  No evidence for acute fracture. 2.  Loss of lordosis.  See above. 3.  Densities at the bilateral lung apices.  Critical test results telephoned to Dr.  Norlene Campbell at the time of interpretation on date 12/15/2012 at time 12:05 a.m.   Original Report Authenticated By: Norva Pavlov, M.D.    Ct Maxillofacial Wo Cm  12/15/2012  *RADIOLOGY REPORT*  Clinical Data:  Tripped and fell on face.  Cardiac arrest.  CT HEAD WITHOUT CONTRAST CT MAXILLOFACIAL WITHOUT CONTRAST CT CERVICAL SPINE WITHOUT CONTRAST  Technique:  Multidetector CT imaging of the head, cervical spine, and maxillofacial structures were performed using the standard protocol without intravenous contrast. Multiplanar CT image reconstructions of the cervical spine and maxillofacial structures were also generated.  Comparison:   None  CT HEAD  Findings: There is a large left sided subdural hematoma, extending from the frontal region through the temporal region into the parietal lobe.  Subdural measures a maximum of 7 mm.  There is significant left right shift, measuring 14 mm.  There is diffuse subarachnoid hemorrhage which appears symmetric bilaterally, involving the sylvian fissures, basilar cisterns, and sulci bilaterally.  Significant cerebral edema is suspected.  Skull fracture extends from the left temporal bone inferiorly to involve the sphenoid bone and superiorly to the vertex.  Along the superior aspect of this, there is significant scalp hematoma. There is a fracture of the zygomatic arch, lateral wall of the  left orbit, lateral wall of the maxillary sinus.  The orbits appear intact.  There is blood within the sphenoid sinus.  IMPRESSION:  1.  Large left subdural hematoma. 2.  Significant left to right midline shift. 3.  Significant subarachnoid hemorrhage, raising the question of aneurysm.  CT MAXILLOFACIAL  Findings:  There are fractures involving the left temporal bone, extending into the sphenoid bone and sphenoid air cell.  There are fractures of the left zygomatic arch, lateral wall  of the left orbit, lateral wall of the left maxillary sinus.  These appear minimally displaced.  No definite pneumocephalus identified.  The globes are intact.  There is opacification of the left sphenoid air cell with lead.  There is a small amount flow within the ethmoid air cells.  Small air-fluid level is identified within the right maxillary sinus.  IMPRESSION:  1.  Numerous fractures as described.  2.  Blood within the left sphenoid air cell. 3.  Globes are intact.  CT CERVICAL SPINE  Findings:   The patient is intubated. There is loss of cervical lordosis.  This may be secondary to splinting, soft tissue injury, or positioning.  There is no evidence for acute fracture.  Images of the lung apices show atelectasis or contusion.  IMPRESSION:  1.  No evidence for acute fracture. 2.  Loss of lordosis.  See above. 3.  Densities at the bilateral lung apices.  Critical test results telephoned to Dr.  Norlene Campbell at the time of interpretation on date 12/15/2012 at time 12:05 a.m.   Original Report Authenticated By: Norva Pavlov, M.D.     Review of Systems  Unable to perform ROS: intubated    Blood pressure 132/89, pulse 73, resp. rate 15, SpO2 100.00%. Physical Exam intubated. Neck in a collar.cv,nl. Lugs some rales. Abdomen soft. Extremities, nl. NEURO LEFT PUPIL 4  Right 2. Do not move to pain secondary to trauma or sedation. Ct head left SDH with shift ,severe and diffuse SAH.   Assessment/Plan Spoke with wife and parents.  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 PLAN TO OR ASAP FOR EVACUATION OF LEFT SDH WITH LEAVING BONE FLAP OUT AND THEN A CEREBRAL ANGIOGRAM IN THE NEXT 24 HOURS. Family aware of prognosis  Adilyn Humes M 12/15/2012, 12:54 AM

## 2012-12-15 NOTE — Progress Notes (Signed)
MRSA swab deferred due to nasal fractures.

## 2012-12-15 NOTE — ED Notes (Signed)
Wife, mother, father and friend at bedside.  OR consent signed by pt wife. Comfort and support given to family. Drinks and snacks given to family

## 2012-12-15 NOTE — Progress Notes (Signed)
Patient ID: Albert [name], male   DOB: April 14, 1976, 37 y.o.   MRN: [id number] Spoke with mother. For cerebral angiogram in am

## 2012-12-15 NOTE — ED Notes (Signed)
Chaplain with family. Pt to OR
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
## 2012-12-15 NOTE — Progress Notes (Signed)
eLink Physician-Brief Progress Note Patient Name: Hussien Greenblatt DOB: 1975/11/04 MRN: [id number]  Date of Service  12/15/2012   HPI/Events of Note  Ongoing issues with agitation/hyptertenion since d/c of propofol and placed on intermittent sedation.   eICU Interventions  Plan: Restart propofol      Annesha Delgreco 12/15/2012, 11:51 PM

## 2012-12-15 NOTE — ED Notes (Signed)
MD with wife going over results. Pt remains non responsive to physical stimuli.  Back board removed with assist of MD
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
## 2012-12-15 NOTE — Progress Notes (Signed)
  Echocardiogram 2D Echocardiogram has been performed.  Albert [name] 12/15/2012, 5:28 PM 

## 2012-12-15 NOTE — Consult Note (Signed)
PULMONARY  / CRITICAL CARE MEDICINE  Name: Albert [name] MRN: [id number] DOB: June 21, 1976    ADMISSION DATE:  12/14/2012 CONSULTATION DATE:  12/15/2012  REFERRING MD : Myna  PRIMARY SERVICE: Neurosurgery  CHIEF COMPLAINT:  AMS  BRIEF PATIENT DESCRIPTION: 37 year old male without significant past medical history s/p fall last night after drinking ETOH. 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 He is now s/p decompressive craniectomy of L SDH.  SIGNIFICANT EVENTS / STUDIES:  CT head 12/14/12>> left SDH with significant left to right midline shift and significant SAH. OR 12/15/12>> left SDH evacuation, insertion of bone flap in the abdominal wall of left upper quadrant  LINES / TUBES: Right radial arterial line 12/15/12>>> ETT 12/14/12>>> Foley 12/14/12>>> Intracranial drain 12/15/12>>>  CULTURES: none  ANTIBIOTICS: none  HISTORY OF PRESENT ILLNESS:  37 year old male without significant past medical history presented to ED after fall while getting of the car. Patient is sedated and intubated, unable to provide history; history obtain from chart review as no family present at this time.  Was out drinking with family, stumble getting out of the car and fell. Family member witnessed event, documented no pulse at the time but patient had a pulse upon EMS arrival to scene. Noted to be non responsive, intubated for airway protection, CT head in ED showed Left SDH and SAH. Taken to OR and is now s/p decompressive surgery for SDH.   PAST MEDICAL HISTORY :  No past medical history on file. No past surgical history on file. Prior to Admission medications   Not on File   No Known Allergies  FAMILY HISTORY:  No family history on file. SOCIAL HISTORY:  has no tobacco, alcohol, and drug history on file.  REVIEW OF SYSTEMS:  Unable to obtain due to patient's condition  SUBJECTIVE:   VITAL SIGNS: Temp:  [94.1 F (34.5 C)-95 F (35 C)] 95 F (35 C) (04/06 0500) Pulse Rate:   [72-81] 72 (04/06 0500) Resp:  [14-18] 16 (04/06 0500) BP: (94-176)/(45-118) 94/55 mmHg (04/06 0500) SpO2:  [90 %-100 %] 97 % (04/06 0500) Arterial Line BP: (115-116)/(59-61) 116/61 mmHg (04/06 0500) FiO2 (%):  [30 %-100 %] 30 % (04/06 0446) Weight:  [98.1 kg (216 lb 4.3 oz)] 98.1 kg (216 lb 4.3 oz) (04/06 0414) HEMODYNAMICS:   VENTILATOR SETTINGS: Vent Mode:  [-] PRVC FiO2 (%):  [30 %-100 %] 30 % Set Rate:  [14 bmp-16 bmp] 16 bmp Vt Set:  [500 mL-600 mL] 600 mL PEEP:  [5 cmH20] 5 cmH20 Plateau Pressure:  [18 cmH20] 18 cmH20 INTAKE / OUTPUT: Intake/Output     04/05 0701 - 04/06 0700   I.V. (mL/kg) 1800 (18.3)   Total Intake(mL/kg) 1800 (18.3)   Urine (mL/kg/hr) 1100   Blood 100   Total Output 1200   Net +600         PHYSICAL EXAMINATION: General:  Sedated and intubated. NAD Neuro:  Sedated, not responsive to sternal rub,  HEENT:  Pupils are 2 mm b/l, not reactive to light.  Post op bandages noted around head with intracranial drainage. Cardiovascular:  RRR, normal S1S2, no murmur, rub or gallop Lungs:  Clear to ascultation bilaterally Abdomen: non distended, + BS  Soft, no guarding or rebound. + Left upper quadrant surgical site without erythema Musculoskeletal: No deformities, no cyanosis or clubbing   LABS:  Recent Labs Lab 12/14/12 2337 12/14/12 2348 12/14/12 2349 12/15/12 0027 12/15/12 0523  HGB 15.8 16.3  --   --   --  WBC 10.1  --   --   --   --   PLT 251  --   --   --   --   NA 134* 138  --   --   --   K 2.8* 2.9*  --   --   --   CL 97 101  --   --   --   CO2 16*  --   --   --   --   GLUCOSE 204* 207*  --   --   --   BUN 11 10  --   --   --   CREATININE 0.88 1.30  --   --   --   CALCIUM 8.5  --   --   --   --   AST 86*  --   --   --   --   ALT 65*  --   --   --   --   ALKPHOS 44  --   --   --   --   BILITOT 0.2*  --   --   --   --   PROT 7.1  --   --   --   --   ALBUMIN 4.3  --   --   --   --   LATICACIDVEN  --   --  6.24*  --   --   PHART  --    --   --  7.210* 7.322*  PCO2ART  --   --   --  51.3* 32.3*  PO2ART  --   --   --  422.0* 110.0*   No results found for this basename: GLUCAP,  in the last 168 hours  CXR: ETT 5 cm above carina, poor inspiration, RLL atalectasis  ASSESSMENT / PLAN:  PULMONARY A:Respisratory failure, intubated for airway protection Respiratory Acidosis P:   -mechanical ventilatory support, continue PRVC with Vt of 6 ml/kg IBW and PEEP of 5. Assessment for SBT and possible liberation from ventilator once stable from neurosurgical standpoint -would repeat ABG to re-evaluate hypercarbia  CARDIOVASCULAR A: Questionable Cardiac arrest, no pulse reported by family on call to EMS P: -unknown down time -check ECG and echo -trend cardiac markers -no ASA due neuro bleed   RENAL A:  Hypokalemia, unclear etiology P:   Replaced as needed, would check Mg as well.  GASTROINTESTINAL A:  Non acute issues P:   -Gi prophylaxis -may start enteral feeds once no plans for further surgical interventions or sooner if surgical procedure not in the next 24 hrs  HEMATOLOGIC A:  No acute issues P:  -follow PT/INR   INFECTIOUS A: elevated lactate -likely stress induced vs cardiac arrest related -trend levels   ENDOCRINE A:  Hyperglycemia   P:   -No history of DM, likely stress induced -Follow A1c -FSBS monitoring with sliding scale -avoid persistent hyperglycemia, if needed start IV insulin  NEUROLOGIC A:  Acute SDH due to fall  with Acute SAH likely due to aneurysmal rupture P:   -S/p decompressive surgery -maintain adequate sedation to avoid spikes in ICP -frequent neuro checks -avoid hyperthermia -glycemic control -plan for further imaging as per neurosx -further care as per primary team  TODAY'S SUMMARY: 37 year old male presented with AMS and possible cardiac arrest after falling. Found to have Left SDH s/p evacuation overnight. Also has SAH possible aneurysmal bleed. Intubated for airway  protection  I have personally obtained a history, examined the patient, evaluated  laboratory and imaging results, formulated the assessment and plan and placed orders. CRITICAL CARE: The patient is critically ill with multiple organ systems failure and requires high complexity decision making for assessment and support, frequent evaluation and titration of therapies, application of advanced monitoring technologies and extensive interpretation of multiple databases. Critical Care Time devoted to patient care services described in this note is 45 minutes.    Pulmonary and Critical Care Medicine [facility] Pager: [phone]  12/15/2012, 5:50 AM

## 2012-12-16 ENCOUNTER — Inpatient Hospital Stay (HOSPITAL_COMMUNITY): Payer: BC Managed Care – PPO

## 2012-12-16 ENCOUNTER — Encounter (HOSPITAL_COMMUNITY): Payer: Self-pay | Admitting: Neurology

## 2012-12-16 DIAGNOSIS — S069XAA Unspecified intracranial injury with loss of consciousness status unknown, initial encounter: Secondary | ICD-10-CM

## 2012-12-16 DIAGNOSIS — S069X9A Unspecified intracranial injury with loss of consciousness of unspecified duration, initial encounter: Secondary | ICD-10-CM

## 2012-12-16 DIAGNOSIS — J95821 Acute postprocedural respiratory failure: Secondary | ICD-10-CM

## 2012-12-16 LAB — GLUCOSE, CAPILLARY
Glucose-Capillary: 148 mg/dL — ABNORMAL HIGH (ref 70–99)
Glucose-Capillary: 135 mg/dL — ABNORMAL HIGH (ref 70–99)
Glucose-Capillary: 115 mg/dL — ABNORMAL HIGH (ref 70–99)

## 2012-12-16 LAB — BASIC METABOLIC PANEL
BUN: 9 mg/dL (ref 6–23)
CO2: 26 mEq/L (ref 19–32)
Calcium: 8.6 mg/dL (ref 8.4–10.5)
Chloride: 106 mEq/L (ref 96–112)
Creatinine, Ser: 0.64 mg/dL (ref 0.50–1.35)
GFR calc Af Amer: >90 mL/min (ref >90)
GFR calc non Af Amer: >90 mL/min (ref >90)
Glucose, Bld: 152 mg/dL — ABNORMAL HIGH (ref 70–99)
Potassium: 3.7 mEq/L (ref 3.5–5.1)
Sodium: 140 mEq/L (ref 135–145)

## 2012-12-16 LAB — URINALYSIS, ROUTINE W REFLEX MICROSCOPIC
Bilirubin Urine: NEGATIVE
Glucose, UA: NEGATIVE mg/dL
Hgb urine dipstick: NEGATIVE
Ketones, ur: NEGATIVE mg/dL
Leukocytes, UA: NEGATIVE
Nitrite: NEGATIVE
Protein, ur: NEGATIVE mg/dL
Specific Gravity, Urine: 1.022 (ref 1.005–1.030)
Urobilinogen, UA: 0.2 mg/dL (ref 0.0–1.0)
pH: 6 (ref 5.0–8.0)

## 2012-12-16 LAB — MAGNESIUM: Magnesium: 2.2 mg/dL (ref 1.5–2.5)

## 2012-12-16 MED ORDER — PIVOT 1.5 CAL PO LIQD
1000.0000 mL | ORAL | Status: DC
  Filled 2012-12-16 (×2): qty 1000

## 2012-12-16 MED ORDER — SODIUM CHLORIDE 0.9 % IV SOLN: INTRAVENOUS | Status: DC

## 2012-12-16 MED ORDER — FENTANYL CITRATE 0.05 MG/ML IJ SOLN
INTRAMUSCULAR | Status: AC | PRN
  Administered 2012-12-16 (×2): 25 ug via INTRAVENOUS

## 2012-12-16 MED ORDER — IOHEXOL 300 MG/ML  SOLN
150.0000 mL | Freq: Once | INTRAMUSCULAR | Status: AC | PRN
  Administered 2012-12-16: 90 mL via INTRA_ARTERIAL

## 2012-12-16 MED ORDER — ACETAMINOPHEN 650 MG RE SUPP: 650.0000 mg | Freq: Four times a day (QID) | RECTAL | Status: DC | PRN

## 2012-12-16 MED ORDER — ACETAMINOPHEN 160 MG/5ML PO SOLN
650.0000 mg | ORAL | Status: DC | PRN
  Administered 2012-12-16 – 2013-01-03 (×30): 650 mg
  Filled 2012-12-16 (×31): qty 20.3

## 2012-12-16 MED ORDER — MIDAZOLAM HCL 2 MG/2ML IJ SOLN
INTRAMUSCULAR | Status: AC
  Filled 2012-12-16: qty 2

## 2012-12-16 MED ORDER — FENTANYL CITRATE 0.05 MG/ML IJ SOLN
INTRAMUSCULAR | Status: AC
  Administered 2012-12-17: 100 ug via INTRAVENOUS
  Filled 2012-12-16: qty 2

## 2012-12-16 MED ORDER — METOPROLOL TARTRATE 1 MG/ML IV SOLN
5.0000 mg | Freq: Once | INTRAVENOUS | Status: AC
  Administered 2012-12-16: 5 mg via INTRAVENOUS
  Filled 2012-12-16: qty 5

## 2012-12-16 MED ORDER — MIDAZOLAM HCL 2 MG/2ML IJ SOLN
INTRAMUSCULAR | Status: AC | PRN
  Administered 2012-12-16 (×2): 1 mg via INTRAVENOUS

## 2012-12-16 MED ORDER — PRO-STAT SUGAR FREE PO LIQD
30.0000 mL | Freq: Two times a day (BID) | ORAL | Status: DC
  Administered 2012-12-16 – 2012-12-17 (×2): 30 mL
  Filled 2012-12-16 (×4): qty 30

## 2012-12-16 MED ORDER — PIVOT 1.5 CAL PO LIQD
1000.0000 mL | ORAL | Status: DC
  Administered 2012-12-16: 1000 mL
  Filled 2012-12-16 (×3): qty 1000

## 2012-12-16 NOTE — Progress Notes (Signed)
Follow-up w/family in waiting area.  Ckd w/3100 dept to confirm pt was still in surgery and was projected he would be out in an hr. Informed family. Will give follow-up info to unit Chaplain. Marjory Lies Chaplain

## 2012-12-16 NOTE — Progress Notes (Signed)
Nursing 1215 CT scan results read.  Dr. Jeral Fruit made aware and CT results impression read to MD.  No new orders at this time.

## 2012-12-16 NOTE — Progress Notes (Signed)
Patient getting a repeat head CT   Not sure how C-spine has been cleared, but patient has not had a collar on all weekend.  This patient has been seen and I agree with the findings and treatment plan.  Marta Lamas. Gae Bon, MD, FACS [phone] (pager) [phone] (direct pager) Trauma Surgeon

## 2012-12-16 NOTE — Procedures (Signed)
S/P 4 vessel cerebral arteriogram  Rt CFApproach . Findings.Marland Kitchen  1.No stenosis ,aneurysms,DAVF,AVMs, or occlusions seen . 2 Mass effect on Lt MCA M2 M3 region with  shift superiorly and medially. 3.Venous outflow WNLs.

## 2012-12-16 NOTE — Progress Notes (Signed)
Visited pt's sister and father at bedside along with Ardis Rowan who had ministered to this family in ED when pt arrived Saturday night. Pt's dad and sister thrilled by CT results today showing no aneurysm. Pt's dad credits God for this good result and states he has received over 250 Facebook posts by people praying for pt. We then accompanied pt's dad and sister to [place] and visited there with pt's wife and mother. [name] very thankful for pt's improvement today. States that pt opens his eyes when she asks him to.

## 2012-12-16 NOTE — Progress Notes (Signed)
Patient ID: Albert [name], male   DOB: 10-20-1975, 37 y.o.   MRN: [id number] Ct better, angio negative. Open eyes to pain. Spoke withwife and father

## 2012-12-16 NOTE — ED Notes (Signed)
From Neuro unit on IV Propofol drip 78mcg/min

## 2012-12-16 NOTE — Progress Notes (Signed)
INITIAL NUTRITION ASSESSMENT  DOCUMENTATION CODES Per approved criteria  -Not Applicable   INTERVENTION:  Advance Pivot 1.5 by 10 ml every 4 hours to goal rate of 50 ml/hr. 30 ml Prostat BID.  At goal rate, tube feeding regimen will provide 2000 kcal, 142 grams of protein (> 100% of needs), and 910 ml of H2O.   TF regimen and current Propofol rate will provide 2232 kcal (96% of estimated needs)  NUTRITION DIAGNOSIS: Inadequate oral intake related to inability to eat as evidenced by NPO status.  Goal: Pt to meet >/= 90% of their estimated nutrition needs.   Monitor:  Vent status, TF tolerance, labs, weight  Reason for Assessment: Consult received to initiate and manage enteral nutrition support.  37 y.o. male  Admitting Dx: Subdural hemorrhage following injury  ASSESSMENT: Pt admitted after fall last night, positive for ETOH. Has SDH and SAH, s/p decompressive craniectomy of L SDH. Pt intubated for airway protection. Pt with questionable cardiac arrest with unknown down time.  Per family at bedside pt was a social drinker, usually only on weekends. Had recently been dieting, was juicing 1 meal per day. Usual weight has been 203-204 lb, current weight likely skewed by fluid status. Pt discussed during ICU rounds and with RN.   Patient is currently intubated on ventilator support.  MV: 11.5 Temp:Temp (24hrs), Avg:100.8 F (38.2 C), Min:100.2 F (37.9 C), Max:101.5 F (38.6 C)  Propofol: 8.8 ml/hr providing 232 kcal from lipid per day    Height: Ht Readings from Last 1 Encounters:  12/15/12 5\' 11"  (1.803 m)    Weight: Wt Readings from Last 1 Encounters:  12/16/12 216 lb 7.9 oz (98.2 kg)    Ideal Body Weight: 78.1 kg  % Ideal Body Weight: 126%  Wt Readings from Last 10 Encounters:  12/16/12 216 lb 7.9 oz (98.2 kg)  12/16/12 216 lb 7.9 oz (98.2 kg)    Usual Body Weight: 203-204 lb  % Usual Body Weight: 94%  BMI:  Body mass index is 30.21 kg/(m^2). Obesity  Class I   Estimated Nutritional Needs: Kcal: 2334 Protein: 139-157 grams Fluid: > 2.3 L/day  Skin: incisions   Diet Order: NPO  EDUCATION NEEDS: -No education needs identified at this time   Intake/Output Summary (Last 24 hours) at 12/16/12 1011 Last data filed at 12/16/12 1000  Gross per 24 hour  Intake 2542.38 ml  Output   3960 ml  Net -1417.62 ml    Last BM: PTA   Labs:   Recent Labs Lab 12/14/12 2337 12/14/12 2348 12/15/12 0630  NA 134* 138 134*  K 2.8* 2.9* 4.4  CL 97 101 102  CO2 16*  --  20  BUN 11 10 10   CREATININE 0.88 1.30 0.74  CALCIUM 8.5  --  7.6*  GLUCOSE 204* 207* 111*    CBG (last 3)   Recent Labs  12/15/12 2343 12/16/12 0310 12/16/12 0750  GLUCAP 150* 148* 135*    Scheduled Meds: . antiseptic oral rinse  15 mL Mouth Rinse QID  . chlorhexidine  15 mL Mouth Rinse BID  . insulin aspart  0-15 Units Subcutaneous Q4H  . levETIRAcetam  500 mg Intravenous BID  . pantoprazole (PROTONIX) IV  40 mg Intravenous QHS    Continuous Infusions: . sodium chloride 100 mL/hr at 12/16/12 0800  . propofol 15 mcg/kg/min (12/16/12 1000)    Past Medical History  Diagnosis Date  . Headache     History reviewed. No pertinent past surgical history.  [place], [place], [place] Pager [phone] After Hours Pager

## 2012-12-16 NOTE — Progress Notes (Signed)
Patient ID: [name], male   DOB: 1976/04/10, 37 y.o.   MRN: [id number]   LOS: 2 days   Subjective: Sedated, on vent.   Objective: Vital signs in last 24 hours: Temp:  [100.2 F (37.9 C)-101.5 F (38.6 C)] 101.1 F (38.4 C) (04/07 0900) Pulse Rate:  [86-109] 95 (04/07 0900) Resp:  [10-28] 18 (04/07 0900) BP: (109-173)/(55-79) 145/65 mmHg (04/07 0900) SpO2:  [92 %-99 %] 95 % (04/07 0900) Arterial Line BP: (136-184)/(55-77) 161/61 mmHg (04/07 0900) FiO2 (%):  [30 %-50 %] 40 % (04/07 0836) Weight:  [216 lb 7.9 oz (98.2 kg)] 216 lb 7.9 oz (98.2 kg) (04/07 0600)    VENT: PRVC/40%/5PEEP/RR16/Vt670ml   UOP: 221ml/h NET: -1600/24h TOTAL: -766ml/admission   Laboratory CBC  Recent Labs  12/14/12 2337 12/14/12 2348 12/15/12 0630  WBC 10.1  --  14.1*  HGB 15.8 16.3 13.8  HCT 43.9 48.0 37.4*  PLT 251  --  212   BMET  Recent Labs  12/14/12 2337 12/14/12 2348 12/15/12 0630  NA 134* 138 134*  K 2.8* 2.9* 4.4  CL 97 101 102  CO2 16*  --  20  GLUCOSE 204* 207* 111*  BUN 11 10 10   CREATININE 0.88 1.30 0.74  CALCIUM 8.5  --  7.6*   CBG (last 3)   Recent Labs  12/15/12 2343 12/16/12 0310 12/16/12 0750  GLUCAP 150* 148* 135*    Radiology Results HCT: Pending CT angio: Pending   Physical Exam General appearance: no distress Resp: clear to auscultation bilaterally Cardio: regular rate and rhythm GI: normal findings: bowel sounds normal and soft, non-tender Pulses: 2+ and symmetric Neuro: E1V1tM1=3t, Pupils =, left NR   Assessment/Plan: Fall TBI s/p decompressive craniectomy -- For angio today, if negative can start slow wean tomorrow per Dr. Jeral Fruit Facial abrasions -- Local care VDRF -- Continue support ID -- Watch low-grade fevers and leukocytosis FEN -- Start TF VTE -- SCD's Dispo -- VDRF    Freeman Caldron, PA-C Pager: [phone] General Trauma PA Pager: [phone]   12/16/2012

## 2012-12-16 NOTE — Progress Notes (Signed)
Dr. Darrick Penna notified of patient's hypertension and biting on ETT.  O2 sats lower, heart rate increasing. New orders received to restart propofol drip. Will monitor closely.

## 2012-12-16 NOTE — Progress Notes (Signed)
PULMONARY  / CRITICAL CARE MEDICINE  Name: Albert [name] MRN: [id number] DOB: 07-05-1976    ADMISSION DATE:  12/14/2012 CONSULTATION DATE:  12/15/2012  REFERRING MD : Myna Hidalgo PRIMARY SERVICE: Neurosurgery  CHIEF COMPLAINT:  AMS  BRIEF PATIENT DESCRIPTION: 37 year old male without significant past medical history s/p fall last night after drinking ETOH. 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 He is now s/p decompressive craniectomy of L SDH.  SIGNIFICANT EVENTS / STUDIES:  CT head 12/14/12>> left SDH with significant left to right midline shift and significant SAH. OR 12/15/12>> left SDH evacuation, insertion of bone flap in the abdominal wall of left upper quadrant  LINES / TUBES: Right radial arterial line 12/15/12>>> ETT 12/14/12>>> PIVs Foley 12/14/12>>> Intracranial drain 12/15/12>>>  CULTURES: Blood 4/7>>> Urine 4/7>>> Sputum 4/7>>>  ANTIBIOTICS: Postop ancef prophy 4/6  SUBJECTIVE:  Unresponsive to pain this AM and unresponsive on wake up assessment.  VITAL SIGNS: Temp:  [100.2 F (37.9 C)-101.5 F (38.6 C)] 101.1 F (38.4 C) (04/07 0900) Pulse Rate:  [86-109] 95 (04/07 0900) Resp:  [10-28] 18 (04/07 0900) BP: (109-173)/(55-79) 145/65 mmHg (04/07 0900) SpO2:  [92 %-99 %] 95 % (04/07 0900) Arterial Line BP: (136-184)/(55-77) 161/61 mmHg (04/07 0900) FiO2 (%):  [30 %-50 %] 40 % (04/07 0836) Weight:  [98.2 kg (216 lb 7.9 oz)] 98.2 kg (216 lb 7.9 oz) (04/07 0600) HEMODYNAMICS: Hypertensive   VENTILATOR SETTINGS: Vent Mode:  [-] PRVC FiO2 (%):  [30 %-50 %] 40 % Set Rate:  [16 bmp] 16 bmp Vt Set:  [600 mL] 600 mL PEEP:  [5 cmH20] 5 cmH20 Plateau Pressure:  [15 cmH20-161 cmH20] 19 cmH20 INTAKE / OUTPUT: Intake/Output     04/06 0701 - 04/07 0700 04/07 0701 - 04/08 0700   I.V. (mL/kg) 2400.3 (24.4) 229.3 (2.3)   IV Piggyback 105    Total Intake(mL/kg) 2505.3 (25.5) 229.3 (2.3)   Urine (mL/kg/hr) 4020 (1.7) 415 (1.4)   Drains 80 (0) 35 (0.1)   Blood     Total Output 4100 450   Net -1594.7 -220.7         PHYSICAL EXAMINATION: General: unresponsive on vent, intubated. NAD Neuro:  Sedated, propofol drip, unresponsive.  HEENT:  Pupils are 2 mm b/l, not reactive to light.  Post op bandages noted around head with intracranial drainage. Cardiovascular:  RRR, normal S1S2, no murmur, rub or gallop Lungs:  Clear to ascultation bilaterally Abdomen: non distended, + BS  Soft, no guarding or rebound. + Left upper quadrant surgical site without erythema Musculoskeletal: No deformities, no cyanosis or clubbing   LABS:  Recent Labs Lab 12/14/12 2337 12/14/12 2348 12/14/12 2349 12/15/12 0027 12/15/12 0523 12/15/12 0630 12/15/12 0930 12/15/12 1628  HGB 15.8 16.3  --   --   --  13.8  --   --   WBC 10.1  --   --   --   --  14.1*  --   --   PLT 251  --   --   --   --  212  --   --   NA 134* 138  --   --   --  134*  --   --   K 2.8* 2.9*  --   --   --  4.4  --   --   CL 97 101  --   --   --  102  --   --   CO2 16*  --   --   --   --  20  --   --   GLUCOSE 204* 207*  --   --   --  111*  --   --   BUN 11 10  --   --   --  10  --   --   CREATININE 0.88 1.30  --   --   --  0.74  --   --   CALCIUM 8.5  --   --   --   --  7.6*  --   --   AST 86*  --   --   --   --   --   --   --   ALT 65*  --   --   --   --   --   --   --   ALKPHOS 44  --   --   --   --   --   --   --   BILITOT 0.2*  --   --   --   --   --   --   --   PROT 7.1  --   --   --   --   --   --   --   ALBUMIN 4.3  --   --   --   --   --   --   --   APTT  --   --   --   --   --  26  --   --   INR  --   --   --   --   --  1.15  --   --   LATICACIDVEN  --   --  6.24*  --   --   --  3.5*  --   TROPONINI  --   --   --   --   --  <0.30  --  <0.30  PHART  --   --   --  7.210* 7.322*  --   --   --   PCO2ART  --   --   --  51.3* 32.3*  --   --   --   PO2ART  --   --   --  422.0* 110.0*  --   --   --     Recent Labs Lab 12/15/12 1519 12/15/12 1923 12/15/12 2343 12/16/12 0310  12/16/12 0750  GLUCAP 125* 146* 150* 148* 135*    CXR: ETT 5 cm above carina, poor inspiration, RLL atalectasis  ASSESSMENT / PLAN: Principal Problem:   Subdural hemorrhage following injury Active Problems:   Respiratory failure following trauma and surgery   Cardiac arrest   Blunt trauma of face   Blunt head trauma   Traumatic subarachnoid bleed with LOC of 30 minutes or less   Elevated ETOH level   PULMONARY A:Respisratory failure, intubated for airway protection Respiratory Acidosis P:   - Continue full support until neuro status is checked and managed.  CARDIOVASCULAR A: Questionable Cardiac arrest, no pulse reported by family on call to EMS Hypertensive d/t blood in head P: - Unknown down time. - Trend cardiac markers. - No ASA due neuro bleed. - PRN labetalol for pain.  RENAL A:  Hypokalemia, unclear etiology, resolved. P:   - Daily bmet.  GASTROINTESTINAL A:  Non acute issues P:   - GI prophylaxis - Consult nutrition for TF.  HEMATOLOGIC A:  No acute issues  PT INR NORMAL P:  - Monitor  INFECTIOUS A: elevated lactate with  fever. - Likely stress induced vs cardiac arrest related - Trend levels  - Hold off abx for now. - Pan culture.  ENDOCRINE A:  Hyperglycemia   P:   - No history of DM, likely stress induced - Follow A1c - FSBS monitoring with sliding scale - Avoid persistent hyperglycemia, if needed start IV insulin - Start TF.  NEUROLOGIC A:  Acute SDH due to fall  with Acute SAH likely due to aneurysmal rupture.  Poor mental status this AM. P:   - S/p decompressive surgery - Maintain adequate sedation to avoid spikes in ICP - Frequent neuro checks - Avoid hyperthermia - Glycemic control - Plan for further imaging as per neurosx - Further care as per primary team  TODAY'S SUMMARY: 37 year old male presented with AMS and possible cardiac arrest after falling. Found to have Left SDH s/p evacuation overnight. Also has SAH possible  aneurysmal bleed. Intubated for airway protection. Getting angio today given deterioration in mental status.  I have personally obtained a history, examined the patient, evaluated laboratory and imaging results, formulated the assessment and plan and placed orders.  CRITICAL CARE: The patient is critically ill with multiple organ systems failure and requires high complexity decision making for assessment and support, frequent evaluation and titration of therapies, application of advanced monitoring technologies and extensive interpretation of multiple databases. Critical Care Time devoted to patient care services described in this note is 35 minutes.  Alyson Reedy, M.D. [facility] Pulmonary/Critical Care Medicine. Pager: [phone]. After hours pager: [phone].

## 2012-12-16 NOTE — ED Notes (Addendum)
IV Propofol bolus 20mg  given PRN- agitation

## 2012-12-16 NOTE — Progress Notes (Signed)
**Note De-identified  Obfuscation** RT note: sputum collected and sent to lab. 

## 2012-12-16 NOTE — Progress Notes (Signed)
UR completed 

## 2012-12-16 NOTE — H&P (Signed)
Albert [name] is an 37 y.o. male.   Chief Complaint: Sub dural hematoma from fall at home after drinking alcohol Subarachnoid hemorrhage/? Aneurysm Probable cardiac arrest Decompression craniectomy for L SDH Scheduled now for cerebral arteriogram to evaluate HPI: on Vent; post craniectomy  Past Medical History  Diagnosis Date  . Headache     History reviewed. No pertinent past surgical history.  History reviewed. No pertinent family history. Social History:  reports that he quit smoking about 10 years ago. His smoking use included Cigarettes. He smoked 0.00 packs per day. He does not have any smokeless tobacco history on file. He reports that he drinks about 3.6 ounces of alcohol per week. His drug history is not on file.  Allergies: No Known Allergies  Medications Prior to Admission  Medication Sig Dispense Refill  . clonazePAM (KLONOPIN) 2 MG tablet Take 2 mg by mouth every morning.      Marland Kitchen GARCINIA CAMBOGIA-CHROMIUM PO Take 1 tablet by mouth daily.        Results for orders placed during the hospital encounter of 12/14/12 (from the past 48 hour(s))  TYPE AND SCREEN     Status: None   Collection Time    12/14/12 11:30 PM      Result Value Range   ABO/RH(D) AB POS     Antibody Screen NEG     Sample Expiration 12/17/2012     Unit Number [id number]     Blood Component Type RED CELLS,LR     Unit division 00     Status of Unit ALLOCATED     Transfusion Status OK TO TRANSFUSE     Crossmatch Result Compatible     Unit Number [id number]     Blood Component Type RED CELLS,LR     Unit division 00     Status of Unit ALLOCATED     Transfusion Status OK TO TRANSFUSE     Crossmatch Result Compatible    ABO/RH     Status: None   Collection Time    12/14/12 11:30 PM      Result Value Range   ABO/RH(D) AB POS    CBC WITH DIFFERENTIAL     Status: Abnormal   Collection Time    12/14/12 11:37 PM      Result Value Range   WBC 10.1  4.0 - 10.5 K/uL   RBC 5.33  4.22 -  5.81 MIL/uL   Hemoglobin 15.8  13.0 - 17.0 g/dL   HCT 69.6  29.5 - 28.4 %   MCV 82.4  78.0 - 100.0 fL   MCH 29.6  26.0 - 34.0 pg   MCHC 36.0  30.0 - 36.0 g/dL   RDW 13.2  44.0 - 10.2 %   Platelets 251  150 - 400 K/uL   Neutrophils Relative 49  43 - 77 %   Neutro Abs 4.9  1.7 - 7.7 K/uL   Lymphocytes Relative 40  12 - 46 %   Lymphs Abs 4.1 (*) 0.7 - 4.0 K/uL   Monocytes Relative 6  3 - 12 %   Monocytes Absolute 0.6  0.1 - 1.0 K/uL   Eosinophils Relative 4  0 - 5 %   Eosinophils Absolute 0.4  0.0 - 0.7 K/uL   Basophils Relative 1  0 - 1 %   Basophils Absolute 0.1  0.0 - 0.1 K/uL  ETHANOL     Status: Abnormal   Collection Time    12/14/12 11:37 PM  Result Value Range   Alcohol, Ethyl (B) 368 (*) 0 - 11 mg/dL   Comment:            LOWEST DETECTABLE LIMIT FOR     SERUM ALCOHOL IS 11 mg/dL     FOR MEDICAL PURPOSES ONLY  COMPREHENSIVE METABOLIC PANEL     Status: Abnormal   Collection Time    12/14/12 11:37 PM      Result Value Range   Sodium 134 (*) 135 - 145 mEq/L   Potassium 2.8 (*) 3.5 - 5.1 mEq/L   Chloride 97  96 - 112 mEq/L   CO2 16 (*) 19 - 32 mEq/L   Glucose, Bld 204 (*) 70 - 99 mg/dL   BUN 11  6 - 23 mg/dL   Creatinine, Ser 4.78  0.50 - 1.35 mg/dL   Calcium 8.5  8.4 - 29.5 mg/dL   Total Protein 7.1  6.0 - 8.3 g/dL   Albumin 4.3  3.5 - 5.2 g/dL   AST 86 (*) 0 - 37 U/L   ALT 65 (*) 0 - 53 U/L   Alkaline Phosphatase 44  39 - 117 U/L   Total Bilirubin 0.2 (*) 0.3 - 1.2 mg/dL   GFR calc non Af Amer >90  >90 mL/min   GFR calc Af Amer >90  >90 mL/min   Comment:            The eGFR has been calculated     using the CKD EPI equation.     This calculation has not been     validated in all clinical     situations.     eGFR's persistently     <90 mL/min signify     possible Chronic Kidney Disease.  POCT I-STAT TROPONIN I     Status: None   Collection Time    12/14/12 11:46 PM      Result Value Range   Troponin i, poc 0.01  0.00 - 0.08 ng/mL   Comment 3             Comment: Due to the release kinetics of cTnI,     a negative result within the first hours     of the onset of symptoms does not rule out     myocardial infarction with certainty.     If myocardial infarction is still suspected,     repeat the test at appropriate intervals.  POCT I-STAT, CHEM 8     Status: Abnormal   Collection Time    12/14/12 11:48 PM      Result Value Range   Sodium 138  135 - 145 mEq/L   Potassium 2.9 (*) 3.5 - 5.1 mEq/L   Chloride 101  96 - 112 mEq/L   BUN 10  6 - 23 mg/dL   Creatinine, Ser 6.21  0.50 - 1.35 mg/dL   Glucose, Bld 308 (*) 70 - 99 mg/dL   Calcium, Ion 6.57 (*) 1.12 - 1.23 mmol/L   TCO2 18  0 - 100 mmol/L   Hemoglobin 16.3  13.0 - 17.0 g/dL   HCT 84.6  96.2 - 95.2 %  CG4 I-STAT (LACTIC ACID)     Status: Abnormal   Collection Time    12/14/12 11:49 PM      Result Value Range   Lactic Acid, Venous 6.24 (*) 0.5 - 2.2 mmol/L  POCT I-STAT 3, BLOOD GAS (G3+)     Status: Abnormal   Collection Time    12/15/12 12:27  AM      Result Value Range   pH, Arterial 7.210 (*) 7.350 - 7.450   pCO2 arterial 51.3 (*) 35.0 - 45.0 mmHg   pO2, Arterial 422.0 (*) 80.0 - 100.0 mmHg   Bicarbonate 20.5  20.0 - 24.0 mEq/L   TCO2 22  0 - 100 mmol/L   O2 Saturation 100.0     Acid-base deficit 8.0 (*) 0.0 - 2.0 mmol/L   Patient temperature 98.7 F     Collection site RADIAL, ALLEN'S TEST ACCEPTABLE     Drawn by RT     Sample type ARTERIAL    URINALYSIS, ROUTINE W REFLEX MICROSCOPIC     Status: Abnormal   Collection Time    12/15/12 12:36 AM      Result Value Range   Color, Urine YELLOW  YELLOW   APPearance CLOUDY (*) CLEAR   Specific Gravity, Urine 1.011  1.005 - 1.030   pH 6.0  5.0 - 8.0   Glucose, UA 100 (*) NEGATIVE mg/dL   Hgb urine dipstick TRACE (*) NEGATIVE   Bilirubin Urine NEGATIVE  NEGATIVE   Ketones, ur NEGATIVE  NEGATIVE mg/dL   Protein, ur 478 (*) NEGATIVE mg/dL   Urobilinogen, UA 0.2  0.0 - 1.0 mg/dL   Nitrite NEGATIVE  NEGATIVE   Leukocytes,  UA NEGATIVE  NEGATIVE  URINE MICROSCOPIC-ADD ON     Status: Abnormal   Collection Time    12/15/12 12:36 AM      Result Value Range   Squamous Epithelial / LPF FEW (*) RARE   WBC, UA 0-2  <3 WBC/hpf   RBC / HPF 3-6  <3 RBC/hpf   Bacteria, UA FEW (*) RARE   Casts HYALINE CASTS (*) NEGATIVE  URINE RAPID DRUG SCREEN (HOSP PERFORMED)     Status: None   Collection Time    12/15/12 12:37 AM      Result Value Range   Opiates NONE DETECTED  NONE DETECTED   Cocaine NONE DETECTED  NONE DETECTED   Benzodiazepines NONE DETECTED  NONE DETECTED   Amphetamines NONE DETECTED  NONE DETECTED   Tetrahydrocannabinol NONE DETECTED  NONE DETECTED   Barbiturates NONE DETECTED  NONE DETECTED   Comment:            DRUG SCREEN FOR MEDICAL PURPOSES     ONLY.  IF CONFIRMATION IS NEEDED     FOR ANY PURPOSE, NOTIFY LAB     WITHIN 5 DAYS.                LOWEST DETECTABLE LIMITS     FOR URINE DRUG SCREEN     Drug Class       Cutoff (ng/mL)     Amphetamine      1000     Barbiturate      200     Benzodiazepine   200     Tricyclics       300     Opiates          300     Cocaine          300     THC              50  BLOOD GAS, ARTERIAL     Status: Abnormal   Collection Time    12/15/12  5:23 AM      Result Value Range   FIO2 0.30     Delivery systems VENTILATOR     Mode PRESSURE REGULATED VOLUME  CONTROL     VT 600     Rate 16     Peep/cpap 5.0     pH, Arterial 7.322 (*) 7.350 - 7.450   pCO2 arterial 32.3 (*) 35.0 - 45.0 mmHg   pO2, Arterial 110.0 (*) 80.0 - 100.0 mmHg   Bicarbonate 16.2 (*) 20.0 - 24.0 mEq/L   TCO2 17.2  0 - 100 mmol/L   Acid-base deficit 8.6 (*) 0.0 - 2.0 mmol/L   O2 Saturation 97.8     Patient temperature 98.6     Collection site A-LINE     Drawn by [phone]     Sample type ARTERIAL DRAW    GLUCOSE, CAPILLARY     Status: None   Collection Time    12/15/12  5:44 AM      Result Value Range   Glucose-Capillary 96  70 - 99 mg/dL   Comment 1 Notify RN    BASIC METABOLIC PANEL      Status: Abnormal   Collection Time    12/15/12  6:30 AM      Result Value Range   Sodium 134 (*) 135 - 145 mEq/L   Potassium 4.4  3.5 - 5.1 mEq/L   Chloride 102  96 - 112 mEq/L   CO2 20  19 - 32 mEq/L   Glucose, Bld 111 (*) 70 - 99 mg/dL   BUN 10  6 - 23 mg/dL   Creatinine, Ser 8.41  0.50 - 1.35 mg/dL   Comment: DELTA CHECK NOTED   Calcium 7.6 (*) 8.4 - 10.5 mg/dL   GFR calc non Af Amer >90  >90 mL/min   GFR calc Af Amer >90  >90 mL/min   Comment:            The eGFR has been calculated     using the CKD EPI equation.     This calculation has not been     validated in all clinical     situations.     eGFR's persistently     <90 mL/min signify     possible Chronic Kidney Disease.  CBC     Status: Abnormal   Collection Time    12/15/12  6:30 AM      Result Value Range   WBC 14.1 (*) 4.0 - 10.5 K/uL   RBC 4.67  4.22 - 5.81 MIL/uL   Hemoglobin 13.8  13.0 - 17.0 g/dL   HCT 32.4 (*) 40.1 - 02.7 %   MCV 80.1  78.0 - 100.0 fL   MCH 29.6  26.0 - 34.0 pg   MCHC 36.9 (*) 30.0 - 36.0 g/dL   RDW 25.3  66.4 - 40.3 %   Platelets 212  150 - 400 K/uL  TROPONIN I     Status: None   Collection Time    12/15/12  6:30 AM      Result Value Range   Troponin I <0.30  <0.30 ng/mL   Comment:            Due to the release kinetics of cTnI,     a negative result within the first hours     of the onset of symptoms does not rule out     myocardial infarction with certainty.     If myocardial infarction is still suspected,     repeat the test at appropriate intervals.  CK TOTAL AND CKMB     Status: None   Collection Time    12/15/12  6:30 AM  Result Value Range   Total CK 190  7 - 232 U/L   CK, MB 2.7  0.3 - 4.0 ng/mL   Relative Index 1.4  0.0 - 2.5  APTT     Status: None   Collection Time    12/15/12  6:30 AM      Result Value Range   aPTT 26  24 - 37 seconds  PROTIME-INR     Status: None   Collection Time    12/15/12  6:30 AM      Result Value Range   Prothrombin Time 14.5   11.6 - 15.2 seconds   INR 1.15  0.00 - 1.49  GLUCOSE, CAPILLARY     Status: Abnormal   Collection Time    12/15/12  7:40 AM      Result Value Range   Glucose-Capillary 112 (*) 70 - 99 mg/dL  LACTIC ACID, PLASMA     Status: Abnormal   Collection Time    12/15/12  9:30 AM      Result Value Range   Lactic Acid, Venous 3.5 (*) 0.5 - 2.2 mmol/L  GLUCOSE, CAPILLARY     Status: Abnormal   Collection Time    12/15/12 11:56 AM      Result Value Range   Glucose-Capillary 115 (*) 70 - 99 mg/dL  GLUCOSE, CAPILLARY     Status: Abnormal   Collection Time    12/15/12  3:19 PM      Result Value Range   Glucose-Capillary 125 (*) 70 - 99 mg/dL  TROPONIN I     Status: None   Collection Time    12/15/12  4:28 PM      Result Value Range   Troponin I <0.30  <0.30 ng/mL   Comment:            Due to the release kinetics of cTnI,     a negative result within the first hours     of the onset of symptoms does not rule out     myocardial infarction with certainty.     If myocardial infarction is still suspected,     repeat the test at appropriate intervals.  CK TOTAL AND CKMB     Status: Abnormal   Collection Time    12/15/12  4:28 PM      Result Value Range   Total CK 258 (*) 7 - 232 U/L   CK, MB 2.7  0.3 - 4.0 ng/mL   Relative Index 1.0  0.0 - 2.5  GLUCOSE, CAPILLARY     Status: Abnormal   Collection Time    12/15/12  7:23 PM      Result Value Range   Glucose-Capillary 146 (*) 70 - 99 mg/dL  GLUCOSE, CAPILLARY     Status: Abnormal   Collection Time    12/15/12 11:43 PM      Result Value Range   Glucose-Capillary 150 (*) 70 - 99 mg/dL  GLUCOSE, CAPILLARY     Status: Abnormal   Collection Time    12/16/12  3:10 AM      Result Value Range   Glucose-Capillary 148 (*) 70 - 99 mg/dL   Dg Chest 1 View  09/19/1476  *RADIOLOGY REPORT*  Clinical Data: Trauma the patient fell and hit head.  Cardiac arrest post intubation and CPR.  CHEST - 1 VIEW  Comparison: None.  Findings: Endotracheal tube has  been placed with tip 3.3 cm above the carina.  Visualization of chest is limited due  to backboard artifact and superimposed structures.  Shallow inspiration. Borderline heart size and pulmonary vascularity may be normal for technique.  Suggestion of focal infiltration or atelectasis in the left mid lung.  No blunting of costophrenic angles.  No pneumothorax.  Visualized ribs are not grossly displaced.  IMPRESSION: Shallow inspiration.  Possible focal atelectasis or infiltration in the left mid lung.  Endotracheal tube tip is 3.3 cm above the carina.   Original Report Authenticated By: Burman Nieves, M.D.    Ct Head Wo Contrast  12/15/2012  *RADIOLOGY REPORT*  Clinical Data:  Tripped and fell on face.  Cardiac arrest.  CT HEAD WITHOUT CONTRAST CT MAXILLOFACIAL WITHOUT CONTRAST CT CERVICAL SPINE WITHOUT CONTRAST  Technique:  Multidetector CT imaging of the head, cervical spine, and maxillofacial structures were performed using the standard protocol without intravenous contrast. Multiplanar CT image reconstructions of the cervical spine and maxillofacial structures were also generated.  Comparison:   None  CT HEAD  Findings: There is a large left sided subdural hematoma, extending from the frontal region through the temporal region into the parietal lobe.  Subdural measures a maximum of 7 mm.  There is significant left right shift, measuring 14 mm.  There is diffuse subarachnoid hemorrhage which appears symmetric bilaterally, involving the sylvian fissures, basilar cisterns, and sulci bilaterally.  Significant cerebral edema is suspected.  Skull fracture extends from the left temporal bone inferiorly to involve the sphenoid bone and superiorly to the vertex.  Along the superior aspect of this, there is significant scalp hematoma. There is a fracture of the zygomatic arch, lateral wall of the left orbit, lateral wall of the maxillary sinus.  The orbits appear intact.  There is blood within the sphenoid sinus.   IMPRESSION:  1.  Large left subdural hematoma. 2.  Significant left to right midline shift. 3.  Significant subarachnoid hemorrhage, raising the question of aneurysm.  CT MAXILLOFACIAL  Findings:  There are fractures involving the left temporal bone, extending into the sphenoid bone and sphenoid air cell.  There are fractures of the left zygomatic arch, lateral wall of the left orbit, lateral wall of the left maxillary sinus.  These appear minimally displaced.  No definite pneumocephalus identified.  The globes are intact.  There is opacification of the left sphenoid air cell with lead.  There is a small amount flow within the ethmoid air cells.  Small air-fluid level is identified within the right maxillary sinus.  IMPRESSION:  1.  Numerous fractures as described.  2.  Blood within the left sphenoid air cell. 3.  Globes are intact.  CT CERVICAL SPINE  Findings:   The patient is intubated. There is loss of cervical lordosis.  This may be secondary to splinting, soft tissue injury, or positioning.  There is no evidence for acute fracture.  Images of the lung apices show atelectasis or contusion.  IMPRESSION:  1.  No evidence for acute fracture. 2.  Loss of lordosis.  See above. 3.  Densities at the bilateral lung apices.  Critical test results telephoned to Dr.  Norlene Campbell at the time of interpretation on date 12/15/2012 at time 12:05 a.m.   Original Report Authenticated By: Norva Pavlov, M.D.    Ct Cervical Spine Wo Contrast  12/15/2012  *RADIOLOGY REPORT*  Clinical Data:  Tripped and fell on face.  Cardiac arrest.  CT HEAD WITHOUT CONTRAST CT MAXILLOFACIAL WITHOUT CONTRAST CT CERVICAL SPINE WITHOUT CONTRAST  Technique:  Multidetector CT imaging of the head, cervical spine, and maxillofacial structures  were performed using the standard protocol without intravenous contrast. Multiplanar CT image reconstructions of the cervical spine and maxillofacial structures were also generated.  Comparison:   None  CT HEAD   Findings: There is a large left sided subdural hematoma, extending from the frontal region through the temporal region into the parietal lobe.  Subdural measures a maximum of 7 mm.  There is significant left right shift, measuring 14 mm.  There is diffuse subarachnoid hemorrhage which appears symmetric bilaterally, involving the sylvian fissures, basilar cisterns, and sulci bilaterally.  Significant cerebral edema is suspected.  Skull fracture extends from the left temporal bone inferiorly to involve the sphenoid bone and superiorly to the vertex.  Along the superior aspect of this, there is significant scalp hematoma. There is a fracture of the zygomatic arch, lateral wall of the left orbit, lateral wall of the maxillary sinus.  The orbits appear intact.  There is blood within the sphenoid sinus.  IMPRESSION:  1.  Large left subdural hematoma. 2.  Significant left to right midline shift. 3.  Significant subarachnoid hemorrhage, raising the question of aneurysm.  CT MAXILLOFACIAL  Findings:  There are fractures involving the left temporal bone, extending into the sphenoid bone and sphenoid air cell.  There are fractures of the left zygomatic arch, lateral wall of the left orbit, lateral wall of the left maxillary sinus.  These appear minimally displaced.  No definite pneumocephalus identified.  The globes are intact.  There is opacification of the left sphenoid air cell with lead.  There is a small amount flow within the ethmoid air cells.  Small air-fluid level is identified within the right maxillary sinus.  IMPRESSION:  1.  Numerous fractures as described.  2.  Blood within the left sphenoid air cell. 3.  Globes are intact.  CT CERVICAL SPINE  Findings:   The patient is intubated. There is loss of cervical lordosis.  This may be secondary to splinting, soft tissue injury, or positioning.  There is no evidence for acute fracture.  Images of the lung apices show atelectasis or contusion.  IMPRESSION:  1.  No  evidence for acute fracture. 2.  Loss of lordosis.  See above. 3.  Densities at the bilateral lung apices.  Critical test results telephoned to Dr.  Norlene Campbell at the time of interpretation on date 12/15/2012 at time 12:05 a.m.   Original Report Authenticated By: Norva Pavlov, M.D.    Portable Chest Xray  12/15/2012  *RADIOLOGY REPORT*  Clinical Data: Endotracheal tube placement.  PORTABLE CHEST - 1 VIEW  Comparison: 12/14/2012  Findings: Endotracheal tube tip measures 5.5 cm above the carina. Enteric tube tip is in the left upper quadrant consistent with location in the upper stomach.  Shallow inspiration.  Heart size and pulmonary vascularity appear normal.  Probable atelectasis or infiltration in the left mid lung and right lung base, improving since previous study.  No pneumothorax.  IMPRESSION: Appliances appear to be in satisfactory location.  Improving aeration of the lungs.   Original Report Authenticated By: Burman Nieves, M.D.    Ct Maxillofacial Wo Cm  12/15/2012  *RADIOLOGY REPORT*  Clinical Data:  Tripped and fell on face.  Cardiac arrest.  CT HEAD WITHOUT CONTRAST CT MAXILLOFACIAL WITHOUT CONTRAST CT CERVICAL SPINE WITHOUT CONTRAST  Technique:  Multidetector CT imaging of the head, cervical spine, and maxillofacial structures were performed using the standard protocol without intravenous contrast. Multiplanar CT image reconstructions of the cervical spine and maxillofacial structures were also generated.  Comparison:  None  CT HEAD  Findings: There is a large left sided subdural hematoma, extending from the frontal region through the temporal region into the parietal lobe.  Subdural measures a maximum of 7 mm.  There is significant left right shift, measuring 14 mm.  There is diffuse subarachnoid hemorrhage which appears symmetric bilaterally, involving the sylvian fissures, basilar cisterns, and sulci bilaterally.  Significant cerebral edema is suspected.  Skull fracture extends from the left  temporal bone inferiorly to involve the sphenoid bone and superiorly to the vertex.  Along the superior aspect of this, there is significant scalp hematoma. There is a fracture of the zygomatic arch, lateral wall of the left orbit, lateral wall of the maxillary sinus.  The orbits appear intact.  There is blood within the sphenoid sinus.  IMPRESSION:  1.  Large left subdural hematoma. 2.  Significant left to right midline shift. 3.  Significant subarachnoid hemorrhage, raising the question of aneurysm.  CT MAXILLOFACIAL  Findings:  There are fractures involving the left temporal bone, extending into the sphenoid bone and sphenoid air cell.  There are fractures of the left zygomatic arch, lateral wall of the left orbit, lateral wall of the left maxillary sinus.  These appear minimally displaced.  No definite pneumocephalus identified.  The globes are intact.  There is opacification of the left sphenoid air cell with lead.  There is a small amount flow within the ethmoid air cells.  Small air-fluid level is identified within the right maxillary sinus.  IMPRESSION:  1.  Numerous fractures as described.  2.  Blood within the left sphenoid air cell. 3.  Globes are intact.  CT CERVICAL SPINE  Findings:   The patient is intubated. There is loss of cervical lordosis.  This may be secondary to splinting, soft tissue injury, or positioning.  There is no evidence for acute fracture.  Images of the lung apices show atelectasis or contusion.  IMPRESSION:  1.  No evidence for acute fracture. 2.  Loss of lordosis.  See above. 3.  Densities at the bilateral lung apices.  Critical test results telephoned to Dr.  Norlene Campbell at the time of interpretation on date 12/15/2012 at time 12:05 a.m.   Original Report Authenticated By: Norva Pavlov, M.D.     Review of Systems  Constitutional: Positive for fever.  Respiratory: Negative for cough.     Blood pressure 128/62, pulse 102, temperature 101.3 F (38.5 C), temperature source Core  (Comment), resp. rate 18, height 5\' 11"  (1.803 m), weight 216 lb 7.9 oz (98.2 kg), SpO2 96.00%. Physical Exam  Constitutional: He appears well-developed and well-nourished.  On vent  Cardiovascular: Normal rate and regular rhythm.   No murmur heard. Respiratory: He is in respiratory distress. He has no wheezes.  vent  GI: Soft. Bowel sounds are normal. There is no tenderness.  Psychiatric:  Consented wife over phone     Assessment/Plan Fall at home etoh abuse SDH; decompression craniectomy SAH; ?aneurysm Probable cardiac arrest On Vent Scheduled for cerebral arteriogram now Pts wife has consented for procedure Understands procedure benefits and risks  Albert [name] A 12/16/2012, 8:07 AM

## 2012-12-16 NOTE — Progress Notes (Signed)
Dr Andrey Campanile notified of pts new A-fib, with rate of 88, and BP 157/76 (94). Orders given for 5 mg Lopressor and BMET w/ Mag. Will continue to monitor pt.   Albert [name]

## 2012-12-17 ENCOUNTER — Inpatient Hospital Stay (HOSPITAL_COMMUNITY): Payer: BC Managed Care – PPO

## 2012-12-17 LAB — BASIC METABOLIC PANEL
BUN: 11 mg/dL (ref 6–23)
CO2: 27 mEq/L (ref 19–32)
Calcium: 8.8 mg/dL (ref 8.4–10.5)
Chloride: 108 mEq/L (ref 96–112)
Creatinine, Ser: 0.66 mg/dL (ref 0.50–1.35)
GFR calc Af Amer: >90 mL/min (ref >90)
GFR calc non Af Amer: >90 mL/min (ref >90)
Glucose, Bld: 167 mg/dL — ABNORMAL HIGH (ref 70–99)
Potassium: 3.7 mEq/L (ref 3.5–5.1)
Sodium: 142 mEq/L (ref 135–145)

## 2012-12-17 LAB — URINE CULTURE
Colony Count: NO GROWTH
Culture: NO GROWTH
Special Requests: NORMAL

## 2012-12-17 LAB — BLOOD GAS, ARTERIAL
Acid-Base Excess: 2.5 mmol/L — ABNORMAL HIGH (ref 0.0–2.0)
Bicarbonate: 26.5 mEq/L — ABNORMAL HIGH (ref 20.0–24.0)
Drawn by: 24487
FIO2: 40 %
MECHVT: 600 mL
O2 Saturation: 98.9 %
PEEP: 5 cmH2O
Patient temperature: 100.4
RATE: 16 resp/min
TCO2: 27.7 mmol/L (ref 0–100)
pCO2 arterial: 43 mmHg (ref 35.0–45.0)
pH, Arterial: 7.412 (ref 7.350–7.450)
pO2, Arterial: 134 mmHg — ABNORMAL HIGH (ref 80.0–100.0)

## 2012-12-17 LAB — TYPE AND SCREEN
ABO/RH(D): AB POS
Antibody Screen: NEGATIVE
Unit division: 0
Unit division: 0

## 2012-12-17 LAB — GLUCOSE, CAPILLARY
Glucose-Capillary: 133 mg/dL — ABNORMAL HIGH (ref 70–99)
Glucose-Capillary: 137 mg/dL — ABNORMAL HIGH (ref 70–99)
Glucose-Capillary: 137 mg/dL — ABNORMAL HIGH (ref 70–99)
Glucose-Capillary: 145 mg/dL — ABNORMAL HIGH (ref 70–99)
Glucose-Capillary: 145 mg/dL — ABNORMAL HIGH (ref 70–99)
Glucose-Capillary: 151 mg/dL — ABNORMAL HIGH (ref 70–99)
Glucose-Capillary: 153 mg/dL — ABNORMAL HIGH (ref 70–99)

## 2012-12-17 LAB — CBC
HCT: 32.7 % — ABNORMAL LOW (ref 39.0–52.0)
Hemoglobin: 11.4 g/dL — ABNORMAL LOW (ref 13.0–17.0)
MCH: 29 pg (ref 26.0–34.0)
MCHC: 34.9 g/dL (ref 30.0–36.0)
MCV: 83.2 fL (ref 78.0–100.0)
Platelets: 197 10*3/uL (ref 150–400)
RBC: 3.93 MIL/uL — ABNORMAL LOW (ref 4.22–5.81)
RDW: 13.5 % (ref 11.5–15.5)
WBC: 12.2 10*3/uL — ABNORMAL HIGH (ref 4.0–10.5)

## 2012-12-17 LAB — PHOSPHORUS: Phosphorus: 1.6 mg/dL — ABNORMAL LOW (ref 2.3–4.6)

## 2012-12-17 LAB — MAGNESIUM: Magnesium: 2.2 mg/dL (ref 1.5–2.5)

## 2012-12-17 MED ORDER — WHITE PETROLATUM GEL
Status: AC
  Administered 2012-12-17: 0.2
  Filled 2012-12-17: qty 5

## 2012-12-17 MED ORDER — THIAMINE HCL 100 MG/ML IJ SOLN
Freq: Once | INTRAVENOUS | Status: AC
  Administered 2012-12-17: 12:00:00 via INTRAVENOUS
  Filled 2012-12-17: qty 1000

## 2012-12-17 MED ORDER — POTASSIUM PHOSPHATE DIBASIC 3 MMOLE/ML IV SOLN
30.0000 mmol | Freq: Once | INTRAVENOUS | Status: AC
  Administered 2012-12-17: 30 mmol via INTRAVENOUS
  Filled 2012-12-17 (×3): qty 10

## 2012-12-17 MED ORDER — ADULT MULTIVITAMIN LIQUID CH
5.0000 mL | Freq: Every day | ORAL | Status: DC
  Administered 2012-12-17 – 2013-01-03 (×15): 5 mL via ORAL
  Filled 2012-12-17 (×20): qty 5

## 2012-12-17 MED ORDER — FENTANYL CITRATE 0.05 MG/ML IJ SOLN
50.0000 ug | INTRAMUSCULAR | Status: DC | PRN
  Administered 2012-12-17 – 2013-01-02 (×25): 100 ug via INTRAVENOUS
  Filled 2012-12-17 (×27): qty 2

## 2012-12-17 MED ORDER — FUROSEMIDE 10 MG/ML IJ SOLN
20.0000 mg | Freq: Three times a day (TID) | INTRAMUSCULAR | Status: AC
  Administered 2012-12-17 (×2): 20 mg via INTRAVENOUS
  Filled 2012-12-17 (×2): qty 2

## 2012-12-17 MED ORDER — MIDAZOLAM HCL 2 MG/2ML IJ SOLN
2.0000 mg | INTRAMUSCULAR | Status: DC | PRN
  Administered 2012-12-17 – 2012-12-22 (×6): 2 mg via INTRAVENOUS
  Administered 2012-12-22 (×2): 4 mg via INTRAVENOUS
  Administered 2012-12-28 – 2012-12-30 (×9): 2 mg via INTRAVENOUS
  Filled 2012-12-17 (×5): qty 2
  Filled 2012-12-17: qty 4
  Filled 2012-12-17 (×4): qty 2
  Filled 2012-12-17: qty 4
  Filled 2012-12-17 (×6): qty 2
  Filled 2012-12-17: qty 4

## 2012-12-17 MED ORDER — PIVOT 1.5 CAL PO LIQD
1000.0000 mL | ORAL | Status: DC
  Administered 2012-12-17 – 2012-12-22 (×6): 1000 mL
  Filled 2012-12-17 (×15): qty 1000

## 2012-12-17 NOTE — Progress Notes (Signed)
Pt converted to NSR from A fib at 2206. HR 74, BP 152/78, and pt arousable. Will continue to monitor pt.  Albert [name]

## 2012-12-17 NOTE — Clinical Social Work Psychosocial (Signed)
Clinical Social Work Department BRIEF PSYCHOSOCIAL ASSESSMENT 12/17/2012  Patient:  TAVION, SENKBEIL     Account Number:  [id number]     Admit date:  12/14/2012  Clinical Social Worker:  Peggyann Shoals  Date/Time:  12/17/2012 07:25 PM  Referred by:  Physician  Date Referred:  12/17/2012 Referred for  Other - See comment   Other Referral:   Assessment  SBIRT   Interview type:  Family Other interview type:    PSYCHOSOCIAL DATA Living Status:  WIFE Admitted from facility:   Level of care:   Primary support name:  Sevin,[phone] Primary support relationship to patient:  SPOUSE Degree of support available:   supportive.    CURRENT CONCERNS Current Concerns  Other - See comment   Other Concerns:   SBIRT    SOCIAL WORK ASSESSMENT / PLAN CSW met with pt's family at bedside. Pt is currently on a vent. CSW introduced herself and explained role of social work. Pt's family is very supportive. CSW provided support to pt's wife and family.    Did not complete SBIRT at this time.    CSW will continue to follow for psychosocial support.    Assessment/plan status:  Psychosocial Support/Ongoing Assessment of Needs Other assessment/ plan:   Information/referral to community resources:   None at this time.    PATIENT'S/FAMILY'S RESPONSE TO PLAN OF CARE: Pt is currently on the vent. [name] is very pleasant and welcomed CSW support.   Dede Query, MSW, LCSW Coverage for Macario Golds, MSW, LCSW [phone]

## 2012-12-17 NOTE — Progress Notes (Signed)
Dr Jeral Fruit examined patient this morning wants to turn off sedation and try patient weaning.  Sedation turned off at 7:39 am.  Patient awake, eyes open but not following commands.

## 2012-12-17 NOTE — Progress Notes (Signed)
PULMONARY  / CRITICAL CARE MEDICINE  Name: Albert [name] MRN: [id number] DOB: 07/25/1976    ADMISSION DATE:  12/14/2012 CONSULTATION DATE:  12/15/2012  REFERRING MD : Myna Hidalgo PRIMARY SERVICE: Neurosurgery  CHIEF COMPLAINT:  AMS  BRIEF PATIENT DESCRIPTION: 37 year old male without significant past medical history s/p fall last night after drinking ETOH. 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 He is now s/p decompressive craniectomy of L SDH.  SIGNIFICANT EVENTS / STUDIES:  CT head 12/14/12>> left SDH with significant left to right midline shift and significant SAH. OR 12/15/12>> left SDH evacuation, insertion of bone flap in the abdominal wall of left upper quadrant  LINES / TUBES: Right radial arterial line 12/15/12>>> ETT 12/14/12>>> PIVs Foley 12/14/12>>> Intracranial drain 12/15/12>>>  CULTURES: Blood 4/7>>> Urine 4/7>>> Sputum 4/7>>>  ANTIBIOTICS: Postop ancef prophy 4/6  SUBJECTIVE:  Unresponsive to pain this AM and unresponsive on wake up assessment.  VITAL SIGNS: Temp:  [100.4 F (38 C)-101.5 F (38.6 C)] 100.8 F (38.2 C) (04/08 0900) Pulse Rate:  [81-104] 103 (04/08 0900) Resp:  [5-28] 23 (04/08 0900) BP: (139-194)/(66-87) 153/83 mmHg (04/08 0900) SpO2:  [95 %-100 %] 98 % (04/08 0900) Arterial Line BP: (169-202)/(65-79) 178/77 mmHg (04/08 0900) FiO2 (%):  [40 %] 40 % (04/08 0810) Weight:  [92.1 kg (203 lb 0.7 oz)] 92.1 kg (203 lb 0.7 oz) (04/08 0400) HEMODYNAMICS: Hypertensive   VENTILATOR SETTINGS: Vent Mode:  [-] PSV FiO2 (%):  [40 %] 40 % Set Rate:  [16 bmp] 16 bmp Vt Set:  [600 mL] 600 mL PEEP:  [5 cmH20] 5 cmH20 Pressure Support:  [5 cmH20-10 cmH20] 10 cmH20 Plateau Pressure:  [18 cmH20-19 cmH20] 18 cmH20 INTAKE / OUTPUT: Intake/Output     04/07 0701 - 04/08 0700 04/08 0701 - 04/09 0700   I.V. (mL/kg) 2621.9 (28.5) 200 (2.2)   Other 40    NG/GT 440 80   IV Piggyback 210    Total Intake(mL/kg) 3311.9 (36) 280 (3)   Urine  (mL/kg/hr) 2570 (1.2)    Drains 250 (0.1) 30 (0.1)   Total Output 2820 30   Net +491.9 +250         PHYSICAL EXAMINATION: General: unresponsive on vent, intubated. NAD Neuro:  Sedated, propofol drip, unresponsive.  HEENT:  Pupils are 2 mm b/l, not reactive to light.  Post op bandages noted around head with intracranial drainage. Cardiovascular:  RRR, normal S1S2, no murmur, rub or gallop Lungs:  Clear to ascultation bilaterally Abdomen: non distended, + BS  Soft, no guarding or rebound. + Left upper quadrant surgical site without erythema Musculoskeletal: No deformities, no cyanosis or clubbing   LABS:  Recent Labs Lab 12/14/12 2337 12/14/12 2348 12/14/12 2349 12/15/12 0027 12/15/12 0523 12/15/12 0630 12/15/12 0930 12/15/12 1628 12/16/12 2055 12/17/12 0418 12/17/12 0422  HGB 15.8 16.3  --   --   --  13.8  --   --   --   --  11.4*  WBC 10.1  --   --   --   --  14.1*  --   --   --   --  12.2*  PLT 251  --   --   --   --  212  --   --   --   --  197  NA 134* 138  --   --   --  134*  --   --  140  --  142  K 2.8* 2.9*  --   --   --  4.4  --   --  3.7  --  3.7  CL 97 101  --   --   --  102  --   --  106  --  108  CO2 16*  --   --   --   --  20  --   --  26  --  27  GLUCOSE 204* 207*  --   --   --  111*  --   --  152*  --  167*  BUN 11 10  --   --   --  10  --   --  9  --  11  CREATININE 0.88 1.30  --   --   --  0.74  --   --  0.64  --  0.66  CALCIUM 8.5  --   --   --   --  7.6*  --   --  8.6  --  8.8  MG  --   --   --   --   --   --   --   --  2.2  --  2.2  PHOS  --   --   --   --   --   --   --   --   --   --  1.6*  AST 86*  --   --   --   --   --   --   --   --   --   --   ALT 65*  --   --   --   --   --   --   --   --   --   --   ALKPHOS 44  --   --   --   --   --   --   --   --   --   --   BILITOT 0.2*  --   --   --   --   --   --   --   --   --   --   PROT 7.1  --   --   --   --   --   --   --   --   --   --   ALBUMIN 4.3  --   --   --   --   --   --   --   --   --    --   APTT  --   --   --   --   --  26  --   --   --   --   --   INR  --   --   --   --   --  1.15  --   --   --   --   --   LATICACIDVEN  --   --  6.24*  --   --   --  3.5*  --   --   --   --   TROPONINI  --   --   --   --   --  <0.30  --  <0.30  --   --   --   PHART  --   --   --  7.210* 7.322*  --   --   --   --  7.412  --   PCO2ART  --   --   --  51.3* 32.3*  --   --   --   --  43.0  --   PO2ART  --   --   --  422.0* 110.0*  --   --   --   --  134.0*  --     Recent Labs Lab 12/16/12 1212 12/16/12 1549 12/16/12 2001 12/16/12 2352 12/17/12 0756  GLUCAP 151* 115* 137* 133* 137*    CXR: ETT 5 cm above carina, poor inspiration, RLL atalectasis  ASSESSMENT / PLAN: Principal Problem:   Subdural hemorrhage following injury Active Problems:   Respiratory failure following trauma and surgery   Cardiac arrest   Blunt trauma of face   Blunt head trauma   Traumatic subarachnoid bleed with LOC of 30 minutes or less   Elevated ETOH level   PULMONARY A:Respisratory failure, intubated for airway protection Respiratory Acidosis P:   - Begin PS trials. - No extubation today given mental status. - SBT in AM. - KVO IVF and gentle diureses.  CARDIOVASCULAR A: Questionable Cardiac arrest, no pulse reported by family on call to EMS Hypertensive d/t blood in head P: - Unknown down time. - No ASA due neuro bleed. - PRN labetalol for HTN and tachycardia.  RENAL A:  Hypokalemia, unclear etiology, resolved. P:   - Daily bmet. - Gentle diureses. - Replace K and Phos.  GASTROINTESTINAL A:  Non acute issues P:   - GI prophylaxis - Continue TF.  HEMATOLOGIC A:  No acute issues  PT INR NORMAL P:  - Monitor.  INFECTIOUS A: elevated lactate with fever. - Likely stress induced vs cardiac arrest related - Hold off abx for now. - Pan culture NTD.  ENDOCRINE A:  Hyperglycemia   P:   - No history of DM, likely stress induced - FSBS monitoring with sliding scale. - Avoid  persistent hyperglycemia, if needed start IV insulin - Continue TF.  NEUROLOGIC A:  Acute SDH due to fall with Acute SAH, no aneurysm noted. P:   - S/p decompressive surgery. - Change sedation to intermittent sedation per neurosurg recommendations to asses mental status. - Thiamine, folate and MVI. - Frequent neuro checks - Avoid hyperthermia. - CIWA protocol. - Glycemic control. - Further care as per neurosurg.  TODAY'S SUMMARY: 37 year old male presented with AMS and possible cardiac arrest after falling. Found to have Left SDH s/p evacuation overnight. Also has SAH possible aneurysmal bleed. Intubated for airway protection. Getting angio today given deterioration in mental status.  I have personally obtained a history, examined the patient, evaluated laboratory and imaging results, formulated the assessment and plan and placed orders.  CRITICAL CARE: The patient is critically ill with multiple organ systems failure and requires high complexity decision making for assessment and support, frequent evaluation and titration of therapies, application of advanced monitoring technologies and extensive interpretation of multiple databases. Critical Care Time devoted to patient care services described in this note is 35 minutes.  Alyson Reedy, M.D. [facility] Pulmonary/Critical Care Medicine. Pager: [phone]. After hours pager: [phone].

## 2012-12-17 NOTE — Progress Notes (Signed)
Sedation stopped for wake up assessment at 7:38 am.  Patient is now opening eyes and following commands off and on.  Family at bedside.    Dr. Molli Knock with [facility] came by and asked that Sedation remain off and intermittent sedation be used.  Propofol order discontinued, per Dr. Molli Knock and Dr. Jeral Fruit.

## 2012-12-17 NOTE — Progress Notes (Signed)
Visited w/pt's father and sister bedside. Visited w/pt's wife and other family members in lobby. Family was thankful for visit and prayers. Marjory Lies Chaplain

## 2012-12-17 NOTE — Progress Notes (Signed)
NUTRITION FOLLOW UP  Intervention:   Increase Pivot 1.5 by 10 ml every 4 hours to new goal rate of 65 ml/hr D/C Prostat  New TF regimen will provide: 2340 kcal ( 97% of needs) and 146 grams protein ( >100% of minimum needs), and 1184 ml H2O  Nutrition Dx:   Inadequate oral intake related to inability to eat as evidenced by NPO status; ongoing.  Goal:   Pt to meet >/= 90% of their estimated nutrition needs; met  Monitor:   TF tolerance, weight, labs  Assessment:   Pt admitted after fall, positive for ETOH. Has SDH and SAH, s/p decompressive craniectomy of L SDH. Pt intubated for airway protection. Pt with questionable cardiac arrest with unknown down time.  Pt discussed during ICU rounds and with RN.  Patient is currently intubated on ventilator support.  MV: 13.5 Temp:Temp (24hrs), Avg:100.9 F (38.3 C), Min:100 F (37.8 C), Max:101.5 F (38.6 C)  Propofol: d/c'ed this am.   Patient has OG tube in place. Pivot 1.5 is infusing @ 50 ml/hr. 30 ml Prostat via tube BID. Tube feeding regimen currently providing 2000 kcal, 142 grams protein, and 910 ml H2O.   Free water flushes: NA  Total free water: 910 ml per day.  Residuals: 20 ml  Pt with low potassium and phosphorus which is being repleted with supplemental IV potassium. Pt also on folic acid and thiamine.    Height: Ht Readings from Last 1 Encounters:  12/15/12 5\' 11"  (1.803 m)    Weight Status:   Wt Readings from Last 1 Encounters:  12/17/12 203 lb 0.7 oz (92.1 kg)    Re-estimated needs:  Kcal: 2407 Protein: 139-157 Fluid: > 2.3 L/day  Skin: incisions  Diet Order: NPO   Intake/Output Summary (Last 24 hours) at 12/17/12 1112 Last data filed at 12/17/12 1000  Gross per 24 hour  Intake   3091 ml  Output   2010 ml  Net   1081 ml    Last BM: PTA   Labs:   Recent Labs Lab 12/15/12 0630 12/16/12 2055 12/17/12 0422  NA 134* 140 142  K 4.4 3.7 3.7  CL 102 106 108  CO2 20 26 27   BUN 10 9 11    CREATININE 0.74 0.64 0.66  CALCIUM 7.6* 8.6 8.8  MG  --  2.2 2.2  PHOS  --   --  1.6*  GLUCOSE 111* 152* 167*    CBG (last 3)   Recent Labs  12/16/12 2001 12/16/12 2352 12/17/12 0756  GLUCAP 137* 133* 137*    Scheduled Meds: . antiseptic oral rinse  15 mL Mouth Rinse QID  . chlorhexidine  15 mL Mouth Rinse BID  . feeding supplement  30 mL Per Tube BID  . furosemide  20 mg Intravenous Q8H  . insulin aspart  0-15 Units Subcutaneous Q4H  . levETIRAcetam  500 mg Intravenous BID  . multivitamin  5 mL Oral Daily  . pantoprazole (PROTONIX) IV  40 mg Intravenous QHS  . potassium phosphate IVPB (mmol)  30 mmol Intravenous Once  . banana bag IV 1000 mL ** MVI currently unavailable **   Intravenous Once    Continuous Infusions: . feeding supplement (PIVOT 1.5 CAL) 1,000 mL (12/16/12 1800)  . propofol Stopped (12/17/12 0800)    Kendell Bane RD, LDN, CNSC [phone] Pager [phone] After Hours Pager

## 2012-12-17 NOTE — Progress Notes (Signed)
Patient ID: Albert [name], male   DOB: Jan 12, 1976, 37 y.o.   MRN: [id number] Stable, f/c off and on. wouds healing well. Left open to air

## 2012-12-17 NOTE — Progress Notes (Signed)
Patient ID: Albert [name], male   DOB: Jan 15, 1976, 37 y.o.   MRN: [id number] Stable. Moves slowly all4 extremities to pain. Atrial fib. Plan to hold sedation and probably extubate him in the next 24 hours

## 2012-12-17 NOTE — Progress Notes (Signed)
General Surgery Note  LOS: 3 days  POD - 2 Days Post-Op Room - 3109  Assessment/Plan: 1.  CRANIECTOMY HEMATOMA EVACUATION SUBDURAL WITH PLACEMENT OF BONE FLAP IN ABDOMINAL WALL - [place] - 10/17/2012  Acute left subdural hematoma/SAH - ground level fall  Angio 12/16/2012 - Mass effect on Lt MCA M2 M3 region with shift superiorly and medially.  Followed by Dr. Jeral Fruit.  Sedation has been cut back.  2.  VDRF  Less sedation now, but coughing a lot. 3.  DVT prophylaxis - PAS, chemical prophylaxis held because of head injury 4.  Nutrition - TF 5.  EtOH abuse - initial EtOH - 368  Subjective:  Intubated.  Coughs, but does not follow command.  Wife at bedside.  Discussed findings with her.  Objective:   Filed Vitals:   12/17/12 0900  BP: 153/83  Pulse: 103  Temp: 100.8 F (38.2 C)  Resp: 23     Intake/Output from previous day:  04/07 0701 - 04/08 0700 In: 3311.9 [I.V.:2621.9; NG/GT:440; IV Piggyback:210] Out: 2820 [Urine:2570; Drains:250]  Intake/Output this shift:  Total I/O In: 280 [I.V.:200; NG/GT:80] Out: 30 [Drains:30]   Physical Exam:   General: WN WM intubated.  He does not follow commands.   HEENT: Has bandage around head.  Endotracheal tube. .   Lungs: Coughing, but lung sounds are symmetric.   Abdomen: Soft.  Tolerated TF so far.  To increase TF to 50 cc/hr.     Lab Results:    Recent Labs  12/15/12 0630 12/17/12 0422  WBC 14.1* 12.2*  HGB 13.8 11.4*  HCT 37.4* 32.7*  PLT 212 197    BMET   Recent Labs  12/16/12 2055 12/17/12 0422  NA 140 142  K 3.7 3.7  CL 106 108  CO2 26 27  GLUCOSE 152* 167*  BUN 9 11  CREATININE 0.64 0.66  CALCIUM 8.6 8.8    PT/INR   Recent Labs  12/15/12 0630  LABPROT 14.5  INR 1.15    ABG   Recent Labs  12/15/12 0523 12/17/12 0418  PHART 7.322* 7.412  HCO3 16.2* 26.5*     Studies/Results:  Dg Chest Port 1 View  12/17/2012  *RADIOLOGY REPORT*  Clinical Data: Endotracheal tube placement.  Intracranial  hemorrhage.  PORTABLE CHEST - 1 VIEW  Comparison: 12/15/2012  Findings: Endotracheal tube 6.3 cm above carina.  Nasogastric tube extends beyond the  inferior aspect of the film.  Normal heart size.  No pleural effusion or pneumothorax.  Improved inspiratory effort with decreased bibasilar atelectasis.  IMPRESSION: Improved lung volumes with decreased bibasilar atelectasis. No acute findings.   Original Report Authenticated By: Jeronimo Greaves, M.D.    Ct Portable Head W/o Cm  12/16/2012  *RADIOLOGY REPORT*  Clinical Data: Closed head injury.  Status post evacuation of acute left subdural hematoma.  Also subarachnoid hemorrhage of uncertain significance.  CT HEAD WITHOUT CONTRAST  Technique:  Contiguous axial images were obtained from the base of the skull through the vertex without contrast.  Comparison: Preoperative scan 12/14/2012.  Findings: The patient has undergone left fronto-temporo-parietal craniectomy for subdural hematoma evacuation.  A surgical drain on the left extends anteromedially and inferiorly into the middle cranial fossa.  There is no significant residual extra-axial collection.  The previously identified significant midline shift has been improved following evacuation of the hematoma. At the level of the septum pellucidum, this now measures 4.1 mm.  There is improvement in the overall degree of subarachnoid hemorrhage.  A small contusion is  seen in the right anterior temporal lobe not definitely present previously.  There is no visible uncal or transtentorial herniation.  The sphenoid sinus shows significant fluid accumulation, which can be an indirect sign of basilar skull fracture.  Redemonstrated are fractures of the left zygomatic arch and orbit.  No mastoid fluid is seen.  IMPRESSION: Improved appearance status post left craniectomy, subdural hematoma evacuation, and drain placement with decreased mass effect.  Left to right shift now measures 4 mm.  New small right anterior temporal lobe  contusion.  Improved subarachnoid blood.  Facial fractures as described, with air-fluid level in the sphenoid sinus.   Original Report Authenticated By: Davonna Belling, M.D.      Anti-infectives:   Anti-infectives   Start     Dose/Rate Route Frequency Ordered Stop   12/15/12 0800  ceFAZolin (ANCEF) IVPB 1 g/50 mL premix     1 g 100 mL/hr over 30 Minutes Intravenous Every 8 hours 12/15/12 0413 12/15/12 1604      Ovidio Kin, MD, FACS Pager: [phone],   [facility] Office: [phone] 12/17/2012

## 2012-12-18 ENCOUNTER — Inpatient Hospital Stay (HOSPITAL_COMMUNITY): Payer: BC Managed Care – PPO

## 2012-12-18 DIAGNOSIS — S065XAA Traumatic subdural hemorrhage with loss of consciousness status unknown, initial encounter: Secondary | ICD-10-CM

## 2012-12-18 DIAGNOSIS — S065X9A Traumatic subdural hemorrhage with loss of consciousness of unspecified duration, initial encounter: Secondary | ICD-10-CM

## 2012-12-18 LAB — GLUCOSE, CAPILLARY
Glucose-Capillary: 130 mg/dL — ABNORMAL HIGH (ref 70–99)
Glucose-Capillary: 136 mg/dL — ABNORMAL HIGH (ref 70–99)
Glucose-Capillary: 159 mg/dL — ABNORMAL HIGH (ref 70–99)
Glucose-Capillary: 131 mg/dL — ABNORMAL HIGH (ref 70–99)
Glucose-Capillary: 142 mg/dL — ABNORMAL HIGH (ref 70–99)
Glucose-Capillary: 157 mg/dL — ABNORMAL HIGH (ref 70–99)
Glucose-Capillary: 144 mg/dL — ABNORMAL HIGH (ref 70–99)

## 2012-12-18 LAB — CBC
HCT: 34 % — ABNORMAL LOW (ref 39.0–52.0)
Hemoglobin: 11.9 g/dL — ABNORMAL LOW (ref 13.0–17.0)
MCH: 29.3 pg (ref 26.0–34.0)
MCHC: 35 g/dL (ref 30.0–36.0)
MCV: 83.7 fL (ref 78.0–100.0)
Platelets: 239 10*3/uL (ref 150–400)
RBC: 4.06 MIL/uL — ABNORMAL LOW (ref 4.22–5.81)
RDW: 13.5 % (ref 11.5–15.5)
WBC: 11.2 10*3/uL — ABNORMAL HIGH (ref 4.0–10.5)

## 2012-12-18 LAB — BASIC METABOLIC PANEL
BUN: 16 mg/dL (ref 6–23)
CO2: 29 mEq/L (ref 19–32)
Calcium: 9.1 mg/dL (ref 8.4–10.5)
Chloride: 106 mEq/L (ref 96–112)
Creatinine, Ser: 0.61 mg/dL (ref 0.50–1.35)
GFR calc Af Amer: >90 mL/min (ref >90)
GFR calc non Af Amer: >90 mL/min (ref >90)
Glucose, Bld: 167 mg/dL — ABNORMAL HIGH (ref 70–99)
Potassium: 3.6 mEq/L (ref 3.5–5.1)
Sodium: 144 mEq/L (ref 135–145)

## 2012-12-18 LAB — BLOOD GAS, ARTERIAL
Acid-Base Excess: 4.1 mmol/L — ABNORMAL HIGH (ref 0.0–2.0)
Bicarbonate: 28.1 mEq/L — ABNORMAL HIGH (ref 20.0–24.0)
Drawn by: 36529
FIO2: 40 %
MECHVT: 600 mL
O2 Saturation: 99.2 %
PEEP: 5 cmH2O
Patient temperature: 98.6
RATE: 16 resp/min
TCO2: 29.4 mmol/L (ref 0–100)
pCO2 arterial: 41.9 mmHg (ref 35.0–45.0)
pH, Arterial: 7.441 (ref 7.350–7.450)
pO2, Arterial: 148 mmHg — ABNORMAL HIGH (ref 80.0–100.0)

## 2012-12-18 LAB — MAGNESIUM: Magnesium: 2.5 mg/dL (ref 1.5–2.5)

## 2012-12-18 LAB — TRIGLYCERIDES: Triglycerides: 86 mg/dL (ref <150)

## 2012-12-18 LAB — PHOSPHORUS: Phosphorus: 2.4 mg/dL (ref 2.3–4.6)

## 2012-12-18 MED ORDER — FUROSEMIDE 10 MG/ML IJ SOLN
20.0000 mg | Freq: Three times a day (TID) | INTRAMUSCULAR | Status: AC
  Administered 2012-12-18 (×2): 20 mg via INTRAVENOUS
  Filled 2012-12-18: qty 2

## 2012-12-18 MED ORDER — PIPERACILLIN-TAZOBACTAM 3.375 G IVPB
3.3750 g | Freq: Three times a day (TID) | INTRAVENOUS | Status: DC
  Administered 2012-12-18 – 2012-12-19 (×3): 3.375 g via INTRAVENOUS
  Filled 2012-12-18 (×5): qty 50

## 2012-12-18 MED ORDER — POTASSIUM PHOSPHATE DIBASIC 3 MMOLE/ML IV SOLN
30.0000 mmol | Freq: Once | INTRAVENOUS | Status: AC
  Administered 2012-12-18: 30 mmol via INTRAVENOUS
  Filled 2012-12-18: qty 10

## 2012-12-18 MED ORDER — VANCOMYCIN HCL IN DEXTROSE 1-5 GM/200ML-% IV SOLN
1000.0000 mg | Freq: Three times a day (TID) | INTRAVENOUS | Status: DC
  Administered 2012-12-18 – 2012-12-19 (×3): 1000 mg via INTRAVENOUS
  Filled 2012-12-18 (×5): qty 200

## 2012-12-18 NOTE — Progress Notes (Signed)
Patient ID: Albert [name], male   DOB: 02-12-1976, 37 y.o.   MRN: [id number] Intubated, sedated. Open eyes and moves to pain. Continue with the drain. Ct head today

## 2012-12-18 NOTE — Progress Notes (Signed)
To central deep pain open his eyes and reach frt the rt-tube with the left arm. The right arm and leg moves some to peripheral pain. Swelling of the scalp with pulsations seen. Chest xray shows atelectasis of right base. Ct head this pm

## 2012-12-18 NOTE — Progress Notes (Signed)
Transported to CT scan on ventilator with FiO2 100%.  Patient tolerated well; no complications noted.  Returned to room and placed on previous settings.

## 2012-12-18 NOTE — Progress Notes (Signed)
PULMONARY  / CRITICAL CARE MEDICINE  Name: Alessio Bogan MRN: [id number] DOB: 11/26/1975    ADMISSION DATE:  12/14/2012 CONSULTATION DATE:  12/15/2012  REFERRING MD : Myna Hidalgo PRIMARY SERVICE: Neurosurgery  CHIEF COMPLAINT:  AMS  BRIEF PATIENT DESCRIPTION: 37 year old male without significant past medical history s/p fall last night after drinking ETOH. 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 He is now s/p decompressive craniectomy of L SDH.  SIGNIFICANT EVENTS / STUDIES:  CT head 12/14/12>> left SDH with significant left to right midline shift and significant SAH. OR 12/15/12>> left SDH evacuation, insertion of bone flap in the abdominal wall of left upper quadrant  LINES / TUBES: Right radial arterial line 12/15/12>>> ETT 12/14/12>>> PIVs Foley 12/14/12>>> Intracranial drain 12/15/12>>>  CULTURES: Blood 4/7>>> Urine 4/7>>> Sputum 4/7>>>  ANTIBIOTICS: Postop ancef prophy 4/6  SUBJECTIVE:  Unresponsive to pain this AM and unresponsive on wake up assessment.  VITAL SIGNS: Temp:  [100 F (37.8 C)-101.5 F (38.6 C)] 101.3 F (38.5 C) (04/09 0805) Pulse Rate:  [68-106] 98 (04/09 0805) Resp:  [15-28] 21 (04/09 0805) BP: (137-164)/(67-93) 154/82 mmHg (04/09 0805) SpO2:  [96 %-100 %] 100 % (04/09 0805) Arterial Line BP: (157-203)/(67-93) 201/93 mmHg (04/09 0805) FiO2 (%):  [40 %] 40 % (04/09 0800) Weight:  [90.1 kg (198 lb 10.2 oz)] 90.1 kg (198 lb 10.2 oz) (04/09 0400) HEMODYNAMICS: Hypertensive   VENTILATOR SETTINGS: Vent Mode:  [-] CPAP;PSV FiO2 (%):  [40 %] 40 % Set Rate:  [16 bmp] 16 bmp Vt Set:  [600 mL] 600 mL PEEP:  [5 cmH20] 5 cmH20 Pressure Support:  [5 cmH20] 5 cmH20 Plateau Pressure:  [11 cmH20-16 cmH20] 15 cmH20 INTAKE / OUTPUT: Intake/Output     04/08 0701 - 04/09 0700 04/09 0701 - 04/10 0700   I.V. (mL/kg) 200 (2.2)    Other 440 20   NG/GT 1480 65   IV Piggyback 805    Total Intake(mL/kg) 2925 (32.5) 85 (0.9)   Urine (mL/kg/hr) 3200  (1.5) 85 (0.6)   Drains 70 (0)    Total Output 3270 85   Net -345 0         PHYSICAL EXAMINATION: General: unresponsive on vent, intubated. NAD Neuro:  Sedated, propofol drip, unresponsive.  HEENT:  Pupils are 2 mm b/l, not reactive to light.  Post op bandages noted around head with intracranial drainage. Cardiovascular:  RRR, normal S1S2, no murmur, rub or gallop Lungs:  Clear to ascultation bilaterally Abdomen: non distended, + BS  Soft, no guarding or rebound. + Left upper quadrant surgical site without erythema Musculoskeletal: No deformities, no cyanosis or clubbing   LABS:  Recent Labs Lab 12/14/12 2337 12/14/12 2348 12/14/12 2349  12/15/12 0523 12/15/12 0630 12/15/12 0930 12/15/12 1628 12/16/12 2055 12/17/12 0418 12/17/12 0422 12/18/12 0500 12/18/12 0515  HGB 15.8 16.3  --   --   --  13.8  --   --   --   --  11.4* 11.9*  --   WBC 10.1  --   --   --   --  14.1*  --   --   --   --  12.2* 11.2*  --   PLT 251  --   --   --   --  212  --   --   --   --  197 239  --   NA 134* 138  --   --   --  134*  --   --  140  --  142 144  --   K 2.8* 2.9*  --   --   --  4.4  --   --  3.7  --  3.7 3.6  --   CL 97 101  --   --   --  102  --   --  106  --  108 106  --   CO2 16*  --   --   --   --  20  --   --  26  --  27 29  --   GLUCOSE 204* 207*  --   --   --  111*  --   --  152*  --  167* 167*  --   BUN 11 10  --   --   --  10  --   --  9  --  11 16  --   CREATININE 0.88 1.30  --   --   --  0.74  --   --  0.64  --  0.66 0.61  --   CALCIUM 8.5  --   --   --   --  7.6*  --   --  8.6  --  8.8 9.1  --   MG  --   --   --   --   --   --   --   --  2.2  --  2.2 2.5  --   PHOS  --   --   --   --   --   --   --   --   --   --  1.6* 2.4  --   AST 86*  --   --   --   --   --   --   --   --   --   --   --   --   ALT 65*  --   --   --   --   --   --   --   --   --   --   --   --   ALKPHOS 44  --   --   --   --   --   --   --   --   --   --   --   --   BILITOT 0.2*  --   --   --   --   --   --    --   --   --   --   --   --   PROT 7.1  --   --   --   --   --   --   --   --   --   --   --   --   ALBUMIN 4.3  --   --   --   --   --   --   --   --   --   --   --   --   APTT  --   --   --   --   --  26  --   --   --   --   --   --   --   INR  --   --   --   --   --  1.15  --   --   --   --   --   --   --  LATICACIDVEN  --   --  6.24*  --   --   --  3.5*  --   --   --   --   --   --   TROPONINI  --   --   --   --   --  <0.30  --  <0.30  --   --   --   --   --   PHART  --   --   --   < > 7.322*  --   --   --   --  7.412  --   --  7.441  PCO2ART  --   --   --   < > 32.3*  --   --   --   --  43.0  --   --  41.9  PO2ART  --   --   --   < > 110.0*  --   --   --   --  134.0*  --   --  148.0*  < > = values in this interval not displayed.  Recent Labs Lab 12/16/12 2352 12/17/12 0756 12/17/12 1600 12/17/12 1930 12/17/12 2348  GLUCAP 133* 137* 153* 145* 145*    CXR: ETT 5 cm above carina, poor inspiration, RLL atalectasis  ASSESSMENT / PLAN: Principal Problem:   Subdural hemorrhage following injury Active Problems:   Respiratory failure following trauma and surgery   Cardiac arrest   Blunt trauma of face   Blunt head trauma   Traumatic subarachnoid bleed with LOC of 30 minutes or less   Elevated ETOH level   PULMONARY A:Respisratory failure, intubated for airway protection Respiratory Acidosis P:   - SBT today, if mental status is improving will consider extubation. - KVO IVF and gentle diureses.  CARDIOVASCULAR A: Questionable Cardiac arrest, no pulse reported by family on call to EMS Hypertensive d/t blood in head P: - Unknown down time. - No ASA due neuro bleed. - PRN labetalol for HTN and tachycardia.  RENAL A:  Hypokalemia, unclear etiology, resolved. P:   - Daily bmet. - Gentle diureses. - Replace K and Phos.  GASTROINTESTINAL A:  Non acute issues P:   - GI prophylaxis - Continue TF.  HEMATOLOGIC A:  No acute issues  PT INR NORMAL P:  -  Monitor.  INFECTIOUS A: elevated lactate with fever. - Likely postop, no evidence of active infection at this time. - Hold off abx for now. - Pan culture NTD.  ENDOCRINE A:  Hyperglycemia   P:   - No history of DM, likely stress induced. - FSBS monitoring with sliding scale. - Avoid persistent hyperglycemia, if needed start IV insulin. - Continue TF.  NEUROLOGIC A:  Acute SDH due to fall with Acute SAH, no aneurysm noted. P:   - S/p decompressive surgery. - Change sedation to intermittent sedation per neurosurg recommendations to asses mental status. - Thiamine, folate and MVI. - Frequent neuro checks - Avoid hyperthermia. - CIWA protocol. - Glycemic control. - Further care as per neurosurg.  TODAY'S SUMMARY: 37 year old male presented with AMS and possible cardiac arrest after falling. Found to have Left SDH s/p evacuation overnight. Also has SAH possible aneurysmal bleed. Intubated for airway protection. Getting angio today given deterioration in mental status.  I have personally obtained a history, examined the patient, evaluated laboratory and imaging results, formulated the assessment and plan and placed orders.  CRITICAL CARE: The patient is critically ill with multiple organ systems failure  and requires high complexity decision making for assessment and support, frequent evaluation and titration of therapies, application of advanced monitoring technologies and extensive interpretation of multiple databases. Critical Care Time devoted to patient care services described in this note is 35 minutes.  Alyson Reedy, M.D. [facility] Pulmonary/Critical Care Medicine. Pager: [phone]. After hours pager: [phone].

## 2012-12-18 NOTE — Progress Notes (Signed)
ANTIBIOTIC CONSULT NOTE - INITIAL  Pharmacy Consult for vancomycin and zosyn Indication: rule out pneumonia, fevers  No Known Allergies  Patient Measurements: Height: 5\' 11"  (180.3 cm) Weight: 198 lb 10.2 oz (90.1 kg) IBW/kg (Calculated) : 75.3   Vital Signs: Temp: 102.4 F (39.1 C) (04/09 1800) BP: 159/87 mmHg (04/09 1800) Pulse Rate: 97 (04/09 1800) Intake/Output from previous day: 04/08 0701 - 04/09 0700 In: 2925 [I.V.:200; NG/GT:1480; IV Piggyback:805] Out: 3270 [Urine:3200; Drains:70] Intake/Output from this shift: Total I/O In: 1220 [Other:150; NG/GT:455; IV Piggyback:615] Out: 1340 [Urine:1285; Drains:55]  Labs:  Recent Labs  12/16/12 2055 12/17/12 0422 12/18/12 0500  WBC  --  12.2* 11.2*  HGB  --  11.4* 11.9*  PLT  --  197 239  CREATININE 0.64 0.66 0.61   Estimated Creatinine Clearance: 137.3 ml/min (by C-G formula based on Cr of 0.61). No results found for this basename: VANCOTROUGH, VANCOPEAK, VANCORANDOM, GENTTROUGH, GENTPEAK, GENTRANDOM, TOBRATROUGH, TOBRAPEAK, TOBRARND, AMIKACINPEAK, AMIKACINTROU, AMIKACIN,  in the last 72 hours   Microbiology: Recent Results (from the past 720 hour(s))  CULTURE, BLOOD (ROUTINE X 2)     Status: None   Collection Time    12/16/12 10:50 AM      Result Value Range Status   Specimen Description BLOOD LEFT HAND   Final   Special Requests BOTTLES DRAWN AEROBIC ONLY 3CC   Final   Culture  Setup Time 12/16/2012 14:21   Final   Culture     Final   Value:        BLOOD CULTURE RECEIVED NO GROWTH TO DATE CULTURE WILL BE HELD FOR 5 DAYS BEFORE ISSUING A FINAL NEGATIVE REPORT   Report Status PENDING   Incomplete  CULTURE, BLOOD (ROUTINE X 2)     Status: None   Collection Time    12/16/12 10:58 AM      Result Value Range Status   Specimen Description BLOOD RIGHT ARM   Final   Special Requests BOTTLES DRAWN AEROBIC AND ANAEROBIC 10CC   Final   Culture  Setup Time 12/16/2012 14:21   Final   Culture     Final   Value:         BLOOD CULTURE RECEIVED NO GROWTH TO DATE CULTURE WILL BE HELD FOR 5 DAYS BEFORE ISSUING A FINAL NEGATIVE REPORT   Report Status PENDING   Incomplete  URINE CULTURE     Status: None   Collection Time    12/16/12 12:03 PM      Result Value Range Status   Specimen Description URINE, CATHETERIZED   Final   Special Requests Normal   Final   Culture  Setup Time 12/16/2012 12:46   Final   Colony Count NO GROWTH   Final   Culture NO GROWTH   Final   Report Status 12/17/2012 FINAL   Final  CULTURE, RESPIRATORY (NON-EXPECTORATED)     Status: None   Collection Time    12/16/12  4:07 PM      Result Value Range Status   Specimen Description TRACHEAL ASPIRATE   Final   Special Requests NONE   Final   Gram Stain     Final   Value: ABUNDANT WBC PRESENT, PREDOMINANTLY PMN     RARE SQUAMOUS EPITHELIAL CELLS PRESENT     MODERATE GRAM NEGATIVE COCCI     MODERATE GRAM POSITIVE COCCI     IN PAIRS   Culture Culture reincubated for better growth   Final   Report Status PENDING  Incomplete    Medical History: Past Medical History  Diagnosis Date  . Headache    Assessment: 37 year old male s/p SDH and SAH following fall, s/p craniectomy now with atelectasis on cxr and fevers orders to start broad empiric abx with vancomycin and zosyn. Excellent renal function.  Goal of Therapy:  Vancomycin trough level 15-20 mcg/ml  Plan:  Vancomycin 1g IV q 8 hours Zosyn 3.375g IV q8 hours Follow up culture data  Sheppard Coil PharmD., BCPS Clinical Pharmacist Pager [phone] 12/18/2012 6:59 PM

## 2012-12-18 NOTE — Progress Notes (Signed)
Follow up - Trauma and Critical Care  Patient Details:    Albert [name] is an 37 y.o. male.  Lines/tubes : Airway 7.5 mm (Active)  Secured at (cm) 23 cm 12/18/2012  7:55 AM  Measured From Lips 12/18/2012  7:55 AM  Secured Location Left 12/18/2012  7:55 AM  Secured By [employer] 12/18/2012  7:55 AM  Tube Holder Repositioned Yes 12/18/2012  7:55 AM  Cuff Pressure (cm H2O) 23 cm H2O 12/17/2012 12:44 PM  Site Condition Dry 12/18/2012  7:55 AM     Arterial Line 12/15/12 Right Radial (Active)  Site Assessment Clean;Dry;Intact 12/18/2012  8:00 AM  Line Status Pulsatile blood flow 12/18/2012  8:00 AM  Art Line Waveform Whip 12/18/2012  8:00 AM  Art Line Interventions Zeroed and calibrated;Leveled 12/18/2012  8:00 AM  Color/Movement/Sensation Capillary refill less than 3 sec 12/18/2012  8:00 AM  Dressing Type Transparent 12/18/2012  8:00 AM  Dressing Status Clean;Dry;Intact 12/18/2012  8:00 AM     Closed System Drain 1 Left;Lateral Other (Comment) Bulb (JP) 10 Fr. (Active)  Site Description Unremarkable 12/18/2012  8:00 AM  Dressing Status None 12/18/2012  8:00 AM  Drainage Appearance Serosanguineous 12/18/2012  8:00 AM  Status To suction (Charged) 12/18/2012  8:00 AM  Intake (mL) 40 ml 12/17/2012  6:00 PM  Output (mL) 40 mL 12/18/2012  4:00 AM     NG/OG Tube Orogastric 16 Fr. Right mouth (Active)  Placement Verification Auscultation 12/18/2012  8:00 AM  Site Assessment Clean;Dry;Intact 12/18/2012  8:00 AM  Status Infusing tube feed 12/18/2012  8:00 AM  Drainage Appearance Tan 12/18/2012  8:00 AM  Gastric Residual 10 mL 12/18/2012  8:00 AM  Intake (mL) 65 mL 12/18/2012  8:00 AM     Urethral Catheter Latex;Straight-tip;Temperature probe 16 Fr. (Active)  Indication for Insertion or Continuance of Catheter Urinary output monitoring 12/18/2012  8:00 AM  Site Assessment Clean;Intact 12/18/2012  8:00 AM  Collection Container Standard drainage bag 12/18/2012  8:00 AM  Securement Method Leg strap 12/18/2012  8:00 AM  Urinary  Catheter Interventions Unclamped 12/18/2012  8:00 AM  Output (mL) 240 mL 12/16/2012  1:00 PM    Microbiology/Sepsis markers: Results for orders placed during the hospital encounter of 12/14/12  CULTURE, BLOOD (ROUTINE X 2)     Status: None   Collection Time    12/16/12 10:50 AM      Result Value Range Status   Specimen Description BLOOD LEFT HAND   Final   Special Requests BOTTLES DRAWN AEROBIC ONLY 3CC   Final   Culture  Setup Time 12/16/2012 14:21   Final   Culture     Final   Value:        BLOOD CULTURE RECEIVED NO GROWTH TO DATE CULTURE WILL BE HELD FOR 5 DAYS BEFORE ISSUING A FINAL NEGATIVE REPORT   Report Status PENDING   Incomplete  CULTURE, BLOOD (ROUTINE X 2)     Status: None   Collection Time    12/16/12 10:58 AM      Result Value Range Status   Specimen Description BLOOD RIGHT ARM   Final   Special Requests BOTTLES DRAWN AEROBIC AND ANAEROBIC 10CC   Final   Culture  Setup Time 12/16/2012 14:21   Final   Culture     Final   Value:        BLOOD CULTURE RECEIVED NO GROWTH TO DATE CULTURE WILL BE HELD FOR 5 DAYS BEFORE ISSUING A FINAL NEGATIVE REPORT  Report Status PENDING   Incomplete  URINE CULTURE     Status: None   Collection Time    12/16/12 12:03 PM      Result Value Range Status   Specimen Description URINE, CATHETERIZED   Final   Special Requests Normal   Final   Culture  Setup Time 12/16/2012 12:46   Final   Colony Count NO GROWTH   Final   Culture NO GROWTH   Final   Report Status 12/17/2012 FINAL   Final  CULTURE, RESPIRATORY (NON-EXPECTORATED)     Status: None   Collection Time    12/16/12  4:07 PM      Result Value Range Status   Specimen Description TRACHEAL ASPIRATE   Final   Special Requests NONE   Final   Gram Stain     Final   Value: ABUNDANT WBC PRESENT, PREDOMINANTLY PMN     RARE SQUAMOUS EPITHELIAL CELLS PRESENT     MODERATE GRAM NEGATIVE COCCI     MODERATE GRAM POSITIVE COCCI     IN PAIRS   Culture Culture reincubated for better growth   Final    Report Status PENDING   Incomplete    Anti-infectives:  Anti-infectives   Start     Dose/Rate Route Frequency Ordered Stop   12/15/12 0800  ceFAZolin (ANCEF) IVPB 1 g/50 mL premix     1 g 100 mL/hr over 30 Minutes Intravenous Every 8 hours 12/15/12 0413 12/15/12 1604      Best Practice/Protocols:  VTE Prophylaxis: Mechanical GI Prophylaxis: Proton Pump Inhibitor Off all sedation  Consults: Treatment Team:  Md Pccm, MD Trauma Md, MD    Events:  Subjective:    Overnight Issues: Patient weaning well.  Not aware of any overnight problems.  Objective:  Vital signs for last 24 hours: Temp:  [100 F (37.8 C)-101.5 F (38.6 C)] 101.3 F (38.5 C) (04/09 0805) Pulse Rate:  [68-106] 98 (04/09 0805) Resp:  [15-28] 21 (04/09 0805) BP: (137-164)/(67-93) 154/82 mmHg (04/09 0805) SpO2:  [96 %-100 %] 100 % (04/09 0805) Arterial Line BP: (157-203)/(67-93) 201/93 mmHg (04/09 0805) FiO2 (%):  [40 %] 40 % (04/09 0800) Weight:  [90.1 kg (198 lb 10.2 oz)] 90.1 kg (198 lb 10.2 oz) (04/09 0400)  Hemodynamic parameters for last 24 hours:    Intake/Output from previous day: 04/08 0701 - 04/09 0700 In: 2925 [I.V.:200; NG/GT:1480; IV Piggyback:805] Out: 3270 [Urine:3200; Drains:70]  Intake/Output this shift: Total I/O In: 85 [Other:20; NG/GT:65] Out: 85 [Urine:85]  Vent settings for last 24 hours: Vent Mode:  [-] CPAP;PSV FiO2 (%):  [40 %] 40 % Set Rate:  [16 bmp] 16 bmp Vt Set:  [600 mL] 600 mL PEEP:  [5 cmH20] 5 cmH20 Pressure Support:  [5 cmH20] 5 cmH20 Plateau Pressure:  [11 cmH20-16 cmH20] 15 cmH20  Physical Exam:  General: no respiratory distress Neuro: RASS -1 and Not agitated currently. Resp: clear to auscultation bilaterally CVS: Sinus tachycardia GI: soft, nontender, BS WNL, no r/g and tolerating tube feedings well. Extremities: no edema, no erythema, pulses WNL  Results for orders placed during the hospital encounter of 12/14/12 (from the past 24 hour(s))   GLUCOSE, CAPILLARY     Status: Abnormal   Collection Time    12/17/12  4:00 PM      Result Value Range   Glucose-Capillary 153 (*) 70 - 99 mg/dL   Comment 1 Notify RN     Comment 2 Documented in Chart    GLUCOSE, CAPILLARY  Status: Abnormal   Collection Time    12/17/12  7:30 PM      Result Value Range   Glucose-Capillary 145 (*) 70 - 99 mg/dL   Comment 1 Notify RN     Comment 2 Documented in Chart    GLUCOSE, CAPILLARY     Status: Abnormal   Collection Time    12/17/12 11:48 PM      Result Value Range   Glucose-Capillary 145 (*) 70 - 99 mg/dL  CBC     Status: Abnormal   Collection Time    12/18/12  5:00 AM      Result Value Range   WBC 11.2 (*) 4.0 - 10.5 K/uL   RBC 4.06 (*) 4.22 - 5.81 MIL/uL   Hemoglobin 11.9 (*) 13.0 - 17.0 g/dL   HCT 16.1 (*) 09.6 - 04.5 %   MCV 83.7  78.0 - 100.0 fL   MCH 29.3  26.0 - 34.0 pg   MCHC 35.0  30.0 - 36.0 g/dL   RDW 40.9  81.1 - 91.4 %   Platelets 239  150 - 400 K/uL  BASIC METABOLIC PANEL     Status: Abnormal   Collection Time    12/18/12  5:00 AM      Result Value Range   Sodium 144  135 - 145 mEq/L   Potassium 3.6  3.5 - 5.1 mEq/L   Chloride 106  96 - 112 mEq/L   CO2 29  19 - 32 mEq/L   Glucose, Bld 167 (*) 70 - 99 mg/dL   BUN 16  6 - 23 mg/dL   Creatinine, Ser 7.82  0.50 - 1.35 mg/dL   Calcium 9.1  8.4 - 95.6 mg/dL   GFR calc non Af Amer >90  >90 mL/min   GFR calc Af Amer >90  >90 mL/min  MAGNESIUM     Status: None   Collection Time    12/18/12  5:00 AM      Result Value Range   Magnesium 2.5  1.5 - 2.5 mg/dL  PHOSPHORUS     Status: None   Collection Time    12/18/12  5:00 AM      Result Value Range   Phosphorus 2.4  2.3 - 4.6 mg/dL  TRIGLYCERIDES     Status: None   Collection Time    12/18/12  5:00 AM      Result Value Range   Triglycerides 86  <150 mg/dL  BLOOD GAS, ARTERIAL     Status: Abnormal   Collection Time    12/18/12  5:15 AM      Result Value Range   FIO2 40.00     Delivery systems VENTILATOR      Mode PRESSURE REGULATED VOLUME CONTROL     VT 600.00     Rate 16.0     Peep/cpap 5.0     pH, Arterial 7.441  7.350 - 7.450   pCO2 arterial 41.9  35.0 - 45.0 mmHg   pO2, Arterial 148.0 (*) 80.0 - 100.0 mmHg   Bicarbonate 28.1 (*) 20.0 - 24.0 mEq/L   TCO2 29.4  0 - 100 mmol/L   Acid-Base Excess 4.1 (*) 0.0 - 2.0 mmol/L   O2 Saturation 99.2     Patient temperature 98.6     Collection site A-LINE     Drawn by [phone]     Sample type ARTERIAL       Assessment/Plan:   NEURO  Altered Mental Status:  change in mental status  and Not following commands.   Plan: CPM, may be able to tell more once the pateint is off the ventilator  PULM  Weaning well and currently not an issue   Plan: CPM, try to extubate if okay with neurosurgery.  CARDIO  Sinus Tachycardia   Plan: No specific treatment.  RENAL  No issues   Plan: CPM  GI  No issues   Plan: CPM  ID  cultures pending   Plan: CPM  HEME  Anemia anemia of critical illness)   Plan: No blood  ENDO No specific abnormalities   Plan: CPM  Global Issues  Patient seems to be at the point where he could be extubated.. Soft nasal G-tube would have to be placed subsequently.  Will talk with nurse and RT.    LOS: 4 days   Additional comments:I reviewed the patient's new clinical lab test results. cbc/bmet and I reviewed the patients new imaging test results. cxr  Critical Care Total Time*: 30 Minutes  Neida Ellegood O 12/18/2012  *Care during the described time interval was provided by me and/or other providers on the critical care team.  I have reviewed this patient's available data, including medical history, events of note, physical examination and test results as part of my evaluation.

## 2012-12-19 ENCOUNTER — Inpatient Hospital Stay (HOSPITAL_COMMUNITY): Payer: BC Managed Care – PPO

## 2012-12-19 LAB — BLOOD GAS, ARTERIAL
Acid-Base Excess: 3.2 mmol/L — ABNORMAL HIGH (ref 0.0–2.0)
Bicarbonate: 26.8 mEq/L — ABNORMAL HIGH (ref 20.0–24.0)
Drawn by: 36259
FIO2: 0.4 %
MECHVT: 600 mL
O2 Saturation: 99.3 %
PEEP: 5 cmH2O
Patient temperature: 98.6
RATE: 16 resp/min
TCO2: 27.9 mmol/L (ref 0–100)
pCO2 arterial: 37.3 mmHg (ref 35.0–45.0)
pH, Arterial: 7.469 — ABNORMAL HIGH (ref 7.350–7.450)
pO2, Arterial: 162 mmHg — ABNORMAL HIGH (ref 80.0–100.0)

## 2012-12-19 LAB — BASIC METABOLIC PANEL
BUN: 23 mg/dL (ref 6–23)
CO2: 29 mEq/L (ref 19–32)
Calcium: 9.6 mg/dL (ref 8.4–10.5)
Chloride: 103 mEq/L (ref 96–112)
Creatinine, Ser: 0.56 mg/dL (ref 0.50–1.35)
GFR calc Af Amer: >90 mL/min (ref >90)
GFR calc non Af Amer: >90 mL/min (ref >90)
Glucose, Bld: 176 mg/dL — ABNORMAL HIGH (ref 70–99)
Potassium: 3.6 mEq/L (ref 3.5–5.1)
Sodium: 141 mEq/L (ref 135–145)

## 2012-12-19 LAB — CBC
HCT: 37.1 % — ABNORMAL LOW (ref 39.0–52.0)
Hemoglobin: 12.7 g/dL — ABNORMAL LOW (ref 13.0–17.0)
MCH: 29.1 pg (ref 26.0–34.0)
MCHC: 34.2 g/dL (ref 30.0–36.0)
MCV: 84.9 fL (ref 78.0–100.0)
Platelets: 255 10*3/uL (ref 150–400)
RBC: 4.37 MIL/uL (ref 4.22–5.81)
RDW: 13.3 % (ref 11.5–15.5)
WBC: 14 10*3/uL — ABNORMAL HIGH (ref 4.0–10.5)

## 2012-12-19 LAB — GLUCOSE, CAPILLARY
Glucose-Capillary: 116 mg/dL — ABNORMAL HIGH (ref 70–99)
Glucose-Capillary: 122 mg/dL — ABNORMAL HIGH (ref 70–99)
Glucose-Capillary: 132 mg/dL — ABNORMAL HIGH (ref 70–99)
Glucose-Capillary: 146 mg/dL — ABNORMAL HIGH (ref 70–99)
Glucose-Capillary: 153 mg/dL — ABNORMAL HIGH (ref 70–99)
Glucose-Capillary: 158 mg/dL — ABNORMAL HIGH (ref 70–99)

## 2012-12-19 LAB — PHOSPHORUS: Phosphorus: 3.3 mg/dL (ref 2.3–4.6)

## 2012-12-19 LAB — MAGNESIUM: Magnesium: 2.6 mg/dL — ABNORMAL HIGH (ref 1.5–2.5)

## 2012-12-19 MED ORDER — FREE WATER
250.0000 mL | Freq: Four times a day (QID) | Status: DC
  Administered 2012-12-19 – 2012-12-23 (×15): 250 mL

## 2012-12-19 MED ORDER — SODIUM CHLORIDE 0.9 % IJ SOLN
10.0000 mL | INTRAMUSCULAR | Status: DC | PRN
  Administered 2012-12-26 – 2013-01-01 (×6): 10 mL

## 2012-12-19 MED ORDER — DIPHENHYDRAMINE HCL 50 MG/ML IJ SOLN
25.0000 mg | Freq: Four times a day (QID) | INTRAMUSCULAR | Status: DC | PRN
  Administered 2012-12-19: 19:00:00 via INTRAVENOUS
  Administered 2012-12-20 (×2): 25 mg via INTRAVENOUS
  Filled 2012-12-19 (×3): qty 1

## 2012-12-19 MED ORDER — SODIUM CHLORIDE 0.9 % IJ SOLN
10.0000 mL | Freq: Two times a day (BID) | INTRAMUSCULAR | Status: DC
  Administered 2012-12-19 – 2012-12-23 (×8): 10 mL
  Administered 2012-12-23: 20 mL
  Administered 2012-12-24 – 2012-12-25 (×4): 10 mL
  Administered 2012-12-28: 20 mL
  Administered 2012-12-29 – 2013-01-03 (×7): 10 mL
  Administered 2013-01-03: 20 mL
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
## 2012-12-19 NOTE — Progress Notes (Signed)
Peripherally Inserted Central Catheter/Midline Placement  The IV Nurse has discussed with the patient and/or persons authorized to consent for the patient, the purpose of this procedure and the potential benefits and risks involved with this procedure.  The benefits include less needle sticks, lab draws from the catheter and patient may be discharged home with the catheter.  Risks include, but not limited to, infection, bleeding, blood clot (thrombus formation), and puncture of an artery; nerve damage and irregular heat beat.  Alternatives to this procedure were also discussed.  Consent obtained from wife at bedside.  PICC/Midline Placement Documentation        Kassy Mcenroe, Lajean Manes 12/19/2012, 6:13 PM

## 2012-12-19 NOTE — Progress Notes (Addendum)
NUTRITION FOLLOW UP  Intervention:   Continue Pivot 1.5 @ goal rate of 65 ml/hr  TF regimen will provide: 2340 kcal ( 104% of needs) and 146 grams protein ( >100% of minimum needs), and 1184 ml H2O  Add free water 250 ml every 6 hours (provides: 1000 ml, total free water: 2284 ml)  Nutrition Dx:   Inadequate oral intake related to inability to eat as evidenced by NPO status; ongoing.  Goal:   Pt to meet >/= 90% of their estimated nutrition needs; met  Monitor:   TF tolerance, weight, labs  Assessment:   Pt admitted after fall, positive for ETOH. Has SDH and SAH, s/p decompressive craniectomy of L SDH. Pt intubated for airway protection. Pt with questionable cardiac arrest with unknown down time.  Pt discussed during ICU rounds and with RN.  Patient is currently intubated on ventilator support. Per MD note cannot be extubated until pt following commands.  MV: 9.2 Temp:Temp (24hrs), Avg:101 F (38.3 C), Min:99.3 F (37.4 C), Max:102.6 F (39.2 C) Pt on cooling blanket.    Residuals: 20 ml  Pt has supplemental potassium phosphate in IVF, also on MVI.    Height: Ht Readings from Last 1 Encounters:  12/15/12 5\' 11"  (1.803 m)    Weight Status:   Wt Readings from Last 1 Encounters:  12/19/12 198 lb 6.6 oz (90 kg)  Admission weight 216 lb Usual weight: 203-204 lb  Re-estimated needs:  Kcal: 2253 Protein: 139-157 Fluid: > 2.3 L/day  Skin: incisions  Diet Order: NPO   Intake/Output Summary (Last 24 hours) at 12/19/12 1029 Last data filed at 12/19/12 0900  Gross per 24 hour  Intake   2730 ml  Output   2680 ml  Net     50 ml    Last BM: PTA   Labs:   Recent Labs Lab 12/17/12 0422 12/18/12 0500 12/19/12 0420  NA 142 144 141  K 3.7 3.6 3.6  CL 108 106 103  CO2 27 29 29   BUN 11 16 23   CREATININE 0.66 0.61 0.56  CALCIUM 8.8 9.1 9.6  MG 2.2 2.5 2.6*  PHOS 1.6* 2.4 3.3  GLUCOSE 167* 167* 176*    CBG (last 3)   Recent Labs  12/18/12 2353  12/19/12 0416 12/19/12 0811  GLUCAP 158* 153* 122*    Scheduled Meds: . antiseptic oral rinse  15 mL Mouth Rinse QID  . chlorhexidine  15 mL Mouth Rinse BID  . insulin aspart  0-15 Units Subcutaneous Q4H  . levETIRAcetam  500 mg Intravenous BID  . multivitamin  5 mL Oral Daily  . pantoprazole (PROTONIX) IV  40 mg Intravenous QHS  . piperacillin-tazobactam (ZOSYN)  IV  3.375 g Intravenous Q8H  . vancomycin  1,000 mg Intravenous Q8H    Continuous Infusions: . feeding supplement (PIVOT 1.5 CAL) 1,000 mL (12/18/12 1401)    Kendell Bane RD, LDN, CNSC [phone] Pager [phone] After Hours Pager

## 2012-12-19 NOTE — Progress Notes (Signed)
UR completed 
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
## 2012-12-19 NOTE — Progress Notes (Signed)
Patient ID: Albert [name], male   DOB: 01-09-1976, 36 y.o.   MRN: [id number] Not f/c. Moves to deep pain. Flap pulsatil. bil trales at bases. Spoke with sister about his clinical condition. Unable to extubate till able to f/c

## 2012-12-19 NOTE — Evaluation (Signed)
Physical Therapy Evaluation Patient Details Name: Albert [name] MRN: [id number] DOB: [birth year] Today's Date: 12/19/2012 Time: 4098-1191 PT Time Calculation (min): 19 min  PT Assessment / Plan / Recommendation Clinical Impression  pt presents with fall resulting in L SDH and SAH s/p L Frontotemporal Parietal Crani with bone flap in L UQ.  pt not opening eyes for therapy until HOB elevated and therapists A with eye opening, then pt able to direct gaze towards name being called.  Attempted to have pt sit in bed bringing head away from bed support with MinA and pt attempting to hold head and turn towards auditory stimuli.  pt did demonstrate yawning and mouthing on ETT during session.  Family education on role of TBI team and importance of quiet time and purposeful stimuli.  pt's family to leave pictures and notebook in room to A with therapy.  Will continue to follow.      PT Assessment  Patient needs continued PT services    Follow Up Recommendations  CIR    Does the patient have the potential to tolerate intense rehabilitation      Barriers to Discharge None      Equipment Recommendations   (TBD)    Recommendations for Other Services Rehab consult   Frequency Min 3X/week    Precautions / Restrictions Precautions Precautions: Fall;Other (comment) Precaution Comments: pt with L Crani with drain and bone flap in L UQ.   Restrictions Weight Bearing Restrictions: No   Pertinent Vitals/Pain Did not indicate pain.        Mobility  Bed Mobility Bed Mobility: Not assessed Transfers Transfers: Not assessed Ambulation/Gait Ambulation/Gait Assistance: Not tested (comment) Stairs: No Wheelchair Mobility Wheelchair Mobility: No    Exercises     PT Diagnosis: Generalized weakness;Altered mental status  PT Problem List: Decreased strength;Decreased activity tolerance;Decreased balance;Decreased mobility;Decreased cognition;Decreased coordination;Decreased knowledge of use  of DME;Decreased safety awareness;Cardiopulmonary status limiting activity PT Treatment Interventions: DME instruction;Gait training;Stair training;Functional mobility training;Therapeutic activities;Therapeutic exercise;Balance training;Neuromuscular re-education;Cognitive remediation;Patient/family education   PT Goals Acute Rehab PT Goals PT Goal Formulation: With family Time For Goal Achievement: 01/02/13 Potential to Achieve Goals: Good Pt will go Supine/Side to Sit: with mod assist PT Goal: Supine/Side to Sit - Progress: Goal set today Pt will Sit at Edge of Bed: with min assist;3-5 min;with unilateral upper extremity support PT Goal: Sit at Edge Of Bed - Progress: Goal set today Pt will go Sit to Supine/Side: with mod assist PT Goal: Sit to Supine/Side - Progress: Goal set today Pt will go Sit to Stand: with mod assist PT Goal: Sit to Stand - Progress: Goal set today Pt will go Stand to Sit: with mod assist PT Goal: Stand to Sit - Progress: Goal set today Pt will Transfer Bed to Chair/Chair to Bed: with mod assist PT Transfer Goal: Bed to Chair/Chair to Bed - Progress: Goal set today Pt will Ambulate: 51 - 150 feet;with +2 total assist PT Goal: Ambulate - Progress: Goal set today Additional Goals Additional Goal #1: pt will visually attend to auditory and visual stimuli for >5 seconds.   PT Goal: Additional Goal #1 - Progress: Goal set today Additional Goal #2: pt will follow one step directions 50% of time.   PT Goal: Additional Goal #2 - Progress: Goal set today  Visit Information  Last PT Received On: 12/19/12 Assistance Needed: +2 PT/OT Co-Evaluation/Treatment: Yes    Subjective Data  Subjective: Per family pt has opened eyes and attended gaze to verbal stimuli, though  inconsistent.   Patient Stated Goal: None stated.     Prior Functioning  Home Living Lives With: Spouse Available Help at Discharge: Family;Available 24 hours/day Home Adaptive Equipment: None Prior  Function Level of Independence: Independent Communication Communication:  (Intubated)    Cognition  Cognition Overall Cognitive Status: Impaired Area of Impairment: JFK Recovery Scale;Rancho level Arousal/Alertness: Lethargic JFK Coma Recovery Scale Auditory: None Visual: None Motor: Flexion Withdrawl Oromotor/Verbal: Oral reflexive Movement Communication: None Arousal: None Total Score: 3 Rancho Levels of Cognitive Functioning Rancho Los Amigos Scales of Cognitive Functioning: Generalized response (Emerging 3, localized response)    Extremity/Trunk Assessment     Balance Balance Balance Assessed: No  End of Session PT - End of Session Activity Tolerance: Patient limited by fatigue Patient left: in bed;with call bell/phone within reach;with family/visitor present  GP     Sunny Schlein, PT [phone] 12/19/2012, 2:51 PM

## 2012-12-19 NOTE — Progress Notes (Signed)
Chaplain did follow-up visit. Visited with pt's mom, dad, wife, and sister. They are seeing slow but steady progress and are encouraged. Pt's eyes follow his dad as he moves. Family expressed appreciation for chaplain's interest.

## 2012-12-19 NOTE — Progress Notes (Signed)
Rehab Admissions Coordinator Note:  Patient was screened by Albert [name] for appropriateness for an Inpatient Acute Rehab Consult.  Await extubation before requesting inpt rehab consult. I will follow.  Albert [name] 12/19/2012, 3:03 PM  I can be reached at [phone].

## 2012-12-19 NOTE — Progress Notes (Signed)
PULMONARY  / CRITICAL CARE MEDICINE  Name: Albert [name] MRN: [id number] DOB: May 25, 1976    ADMISSION DATE:  12/14/2012 CONSULTATION DATE:  12/15/2012  REFERRING MD : Myna Hidalgo PRIMARY SERVICE: Neurosurgery  CHIEF COMPLAINT:  AMS  BRIEF PATIENT DESCRIPTION: 37 year old male without significant past medical history s/p fall last night after drinking ETOH. 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 He is now s/p decompressive craniectomy of L SDH.  SIGNIFICANT EVENTS / STUDIES:  CT head 12/14/12>> left SDH with significant left to right midline shift and significant SAH. OR 12/15/12>> left SDH evacuation, insertion of bone flap in the abdominal wall of left upper quadrant  LINES / TUBES: Right radial arterial line 12/15/12>>> ETT 12/14/12>>> PIVs Foley 12/14/12>>> Intracranial drain 12/15/12>>>  CULTURES: Blood 4/7>>> Urine 4/7>>> Sputum 4/7>>>  ANTIBIOTICS: Postop ancef prophy 4/6 Vanc 4/9>>> Zosyn 4/9>>>  SUBJECTIVE:  Unresponsive to pain this AM and unresponsive on wake up assessment.  VITAL SIGNS: Temp:  [99.3 F (37.4 C)-102.6 F (39.2 C)] 99.3 F (37.4 C) (04/10 0900) Pulse Rate:  [54-98] 69 (04/10 0900) Resp:  [15-29] 24 (04/10 0900) BP: (130-196)/(71-99) 154/89 mmHg (04/10 0900) SpO2:  [98 %-100 %] 100 % (04/10 0900) Arterial Line BP: (171-200)/(72-92) 200/87 mmHg (04/10 0900) FiO2 (%):  [40 %] 40 % (04/10 0900) Weight:  [90 kg (198 lb 6.6 oz)] 90 kg (198 lb 6.6 oz) (04/10 0500) HEMODYNAMICS: Hypertensive   VENTILATOR SETTINGS: Vent Mode:  [-] CPAP;PSV FiO2 (%):  [40 %] 40 % Set Rate:  [16 bmp] 16 bmp Vt Set:  [600 mL] 600 mL PEEP:  [5 cmH20] 5 cmH20 Pressure Support:  [5 cmH20] 5 cmH20 Plateau Pressure:  [10 cmH20-18 cmH20] 11 cmH20 INTAKE / OUTPUT: Intake/Output     04/09 0701 - 04/10 0700 04/10 0701 - 04/11 0700   P.O. 40    I.V. (mL/kg)     Other 310 20   NG/GT 1170 160   IV Piggyback 1220    Total Intake(mL/kg) 2740 (30.4) 180 (2)   Urine (mL/kg/hr) 2485 (1.2) 225 (0.6)   Drains 55 (0)    Total Output 2540 225   Net +200 -45         PHYSICAL EXAMINATION: General: arousable intermittently on vent, intubated. NAD. Neuro: Off sedation but intermittently arousable, not following commands.  HEENT: Plover, post op head, moves ext to pain. Cardiovascular:  RRR, normal S1S2, no murmur, rub or gallop Lungs:  Clear to ascultation bilaterally Abdomen: non distended, + BS  Soft, no guarding or rebound. + Left upper quadrant surgical site without erythema Musculoskeletal: No deformities, no cyanosis or clubbing   LABS:  Recent Labs Lab 12/14/12 2337 12/14/12 2348 12/14/12 2349  12/15/12 0630 12/15/12 0930 12/15/12 1628  12/17/12 0418 12/17/12 0422 12/18/12 0500 12/18/12 0515 12/19/12 0420 12/19/12 0423  HGB 15.8 16.3  --   --  13.8  --   --   --   --  11.4* 11.9*  --  12.7*  --   WBC 10.1  --   --   --  14.1*  --   --   --   --  12.2* 11.2*  --  14.0*  --   PLT 251  --   --   --  212  --   --   --   --  197 239  --  255  --   NA 134* 138  --   --  134*  --   --   < >  --  142 144  --  141  --   K 2.8* 2.9*  --   --  4.4  --   --   < >  --  3.7 3.6  --  3.6  --   CL 97 101  --   --  102  --   --   < >  --  108 106  --  103  --   CO2 16*  --   --   --  20  --   --   < >  --  27 29  --  29  --   GLUCOSE 204* 207*  --   --  111*  --   --   < >  --  167* 167*  --  176*  --   BUN 11 10  --   --  10  --   --   < >  --  11 16  --  23  --   CREATININE 0.88 1.30  --   --  0.74  --   --   < >  --  0.66 0.61  --  0.56  --   CALCIUM 8.5  --   --   --  7.6*  --   --   < >  --  8.8 9.1  --  9.6  --   MG  --   --   --   --   --   --   --   < >  --  2.2 2.5  --  2.6*  --   PHOS  --   --   --   --   --   --   --   --   --  1.6* 2.4  --  3.3  --   AST 86*  --   --   --   --   --   --   --   --   --   --   --   --   --   ALT 65*  --   --   --   --   --   --   --   --   --   --   --   --   --   ALKPHOS 44  --   --   --   --   --   --    --   --   --   --   --   --   --   BILITOT 0.2*  --   --   --   --   --   --   --   --   --   --   --   --   --   PROT 7.1  --   --   --   --   --   --   --   --   --   --   --   --   --   ALBUMIN 4.3  --   --   --   --   --   --   --   --   --   --   --   --   --   APTT  --   --   --   --  26  --   --   --   --   --   --   --   --   --  INR  --   --   --   --  1.15  --   --   --   --   --   --   --   --   --   LATICACIDVEN  --   --  6.24*  --   --  3.5*  --   --   --   --   --   --   --   --   TROPONINI  --   --   --   --  <0.30  --  <0.30  --   --   --   --   --   --   --   PHART  --   --   --   < >  --   --   --   --  7.412  --   --  7.441  --  7.469*  PCO2ART  --   --   --   < >  --   --   --   --  43.0  --   --  41.9  --  37.3  PO2ART  --   --   --   < >  --   --   --   --  134.0*  --   --  148.0*  --  162.0*  < > = values in this interval not displayed.  Recent Labs Lab 12/18/12 1606 12/18/12 2009 12/18/12 2353 12/19/12 0416 12/19/12 0811  GLUCAP 157* 144* 158* 153* 122*    CXR: Noted, ET tube ok.  ASSESSMENT / PLAN: Principal Problem:   Subdural hemorrhage following injury Active Problems:   Respiratory failure following trauma and surgery   Cardiac arrest   Blunt trauma of face   Blunt head trauma   Traumatic subarachnoid bleed with LOC of 30 minutes or less   Elevated ETOH level  PULMONARY A:Respisratory failure, intubated for airway protection Respiratory Acidosis P:   - PS trials but no extubation until mental status is improved. - KVO IVF and gentle diureses but will defer to trauma at this point.  CARDIOVASCULAR A: Questionable Cardiac arrest, no pulse reported by family on call to EMS Hypertensive d/t blood in head P: - Unknown down time. - No ASA due neuro bleed. - PRN labetalol for HTN and tachycardia.  RENAL A:  Hypokalemia, unclear etiology, resolved. P:   - Daily bmet. - Recommend further gentle diureses but will defer to trauma. -  Electrolyte replacement as needed.  GASTROINTESTINAL A:  Non acute issues P:   - GI prophylaxis - Continue TF.  HEMATOLOGIC A:  No acute issues  PT INR NORMAL P:  - Monitor.  INFECTIOUS A: elevated lactate with fever.  Vanc/zosyn started. - Likely postop, no evidence of active infection at this time. - Pan culture NTD.  ENDOCRINE A:  Hyperglycemia   P:   - No history of DM, likely stress induced. - FSBS monitoring with sliding scale. - Avoid persistent hyperglycemia, if needed start IV insulin. - Continue TF.  NEUROLOGIC A:  Acute SDH due to fall with Acute SAH, no aneurysm noted. P:   - S/p decompressive surgery. - Change sedation to intermittent sedation per neurosurg recommendations to asses mental status. - Thiamine, folate and MVI. - Frequent neuro checks - Avoid hyperthermia. - CIWA protocol. - Glycemic control. - Further care as per neurosurg.  Trauma service assumed care of case at this point, PCCM will sign off, please  call back if needed.  I have personally obtained a history, examined the patient, evaluated laboratory and imaging results, formulated the assessment and plan and placed orders.  CRITICAL CARE: The patient is critically ill with multiple organ systems failure and requires high complexity decision making for assessment and support, frequent evaluation and titration of therapies, application of advanced monitoring technologies and extensive interpretation of multiple databases. Critical Care Time devoted to patient care services described in this note is 35 minutes.  Alyson Reedy, M.D. [facility] Pulmonary/Critical Care Medicine. Pager: [phone]. After hours pager: [phone].

## 2012-12-19 NOTE — Evaluation (Signed)
Occupational Therapy Evaluation Patient Details Name: Albert [name] MRN: [id number] DOB: 01/11/1976 Today's Date: 12/19/2012 Time: 4098-1191 OT Time Calculation (min): 47 min  OT Assessment / Plan / Recommendation Clinical Impression  37 yo male s/p  fall resulting in L SDH and SAH s/p L Frontotemporal Parietal Crani with bone flap in L UQ. Pt currently Rancho II with emerging Rancho III. Pt currently on vent and non verbal. Ot to follow acutely. Recommend Cir for d/c planning    OT Assessment  Patient needs continued OT Services    Follow Up Recommendations  CIR    Barriers to Discharge      Equipment Recommendations  Other (comment) (TBA)    Recommendations for Other Services Rehab consult  Frequency  Min 3X/week    Precautions / Restrictions Precautions Precautions: Fall;Other (comment) Precaution Comments: pt with L Crani with drain, blue wedge block on Rt wrist for aline and bone flap in L UQ.   Restrictions Weight Bearing Restrictions: No   Pertinent Vitals/Pain Vent Family educated with TBI booklet and the purpose of TBI team    ADL  Eating/Feeding: NPO ADL Comments: Pt currently Rancho Level II with emerging Localized response Rancho III. Pt demonstrates rotating neck Rt and Lt spontaneously during session. Pt with reactive pupils. Pt with HOB incr to 45 degrees and facilitation for neutral neck position. pt automatically opened eyes for ~6 seconds. Pt closing eyes. pt with physcial (A) to open eyes. Pt with eyes drifting Rt to Lt with a repetitive motion. Pt withdrawing all 4 extremities to pain. Pt with Lt elbow flexion to pull away from stimulus. Pt no gross grasp demonstrated this session. sister present calling name and tactile input attempting to get a response. pt not following commands.      OT Diagnosis: Generalized weakness;Cognitive deficits  OT Problem List: Decreased strength;Decreased range of motion;Decreased activity tolerance;Impaired balance  (sitting and/or standing);Impaired vision/perception;Decreased coordination;Decreased cognition;Decreased safety awareness;Decreased knowledge of precautions;Decreased knowledge of use of DME or AE OT Treatment Interventions: Self-care/ADL training   OT Goals Acute Rehab OT Goals OT Goal Formulation: With family Time For Goal Achievement: 01/02/13 Potential to Achieve Goals: Good ADL Goals Pt Will Perform Grooming: with max assist;Supported;Sitting, edge of bed;with cueing (comment type and amount) (tactile Hand over hand) ADL Goal: Grooming - Progress: Goal set today Miscellaneous OT Goals Miscellaneous OT Goal #1: Pt will follow 1 step simple command 2 out 5 trialls OT Goal: Miscellaneous Goal #1 - Progress: Goal set today Miscellaneous OT Goal #2: Pt will demonstrate localized response 3 out 4 trials OT Goal: Miscellaneous Goal #2 - Progress: Goal set today Miscellaneous OT Goal #3: Pt will tolerate EOB sitting ~5 minutes with max (A) OT Goal: Miscellaneous Goal #3 - Progress: Goal set today  Visit Information  Last OT Received On: 12/19/12 Assistance Needed: +2 PT/OT Co-Evaluation/Treatment: Yes    Subjective Data  Subjective: vent patient Patient Stated Goal: unable to participate   Prior Functioning     Home Living Lives With: Spouse Available Help at Discharge: Family;Available 24 hours/day Home Adaptive Equipment: None Prior Function Level of Independence: Independent Communication Communication: Other (comment) (Intubated) Dominant Hand:  (unknown at this time)         Vision/Perception Vision - History Baseline Vision: Other (comment) Patient Visual Report: Other (comment) (pupils reactive to light ) Vision - Assessment Vision Assessment: Vision not tested Additional Comments: due to cognition not currently tested to be further assessed   Cognition  Cognition Overall Cognitive Status:  Impaired Area of Impairment: JFK Recovery Scale;Rancho  level Arousal/Alertness: Lethargic Behavior During Session: Lethargic JFK Coma Recovery Scale Auditory: None Visual: None Motor: Flexion Withdrawl Oromotor/Verbal: Oral reflexive Movement Communication: None Arousal: None Total Score: 3 Rancho Levels of Cognitive Functioning Rancho Los Amigos Scales of Cognitive Functioning: Generalized response (Emerging 3, localized response)    Extremity/Trunk Assessment Right Upper Extremity Assessment RUE ROM/Strength/Tone: Deficits;Due to impaired cognition Left Upper Extremity Assessment LUE ROM/Strength/Tone: Deficits;Due to impaired cognition     Mobility Bed Mobility Bed Mobility: Not assessed Transfers Transfers: Not assessed     Exercise     Balance Balance Balance Assessed: No   End of Session OT - End of Session Activity Tolerance: Patient tolerated treatment well Patient left: in bed;with call bell/phone within reach Nurse Communication: Mobility status;Precautions  GO     Lucile Shutters 12/19/2012, 4:06 PM  Pager: [phone]

## 2012-12-19 NOTE — Progress Notes (Addendum)
Patient ID: Albert [name], male   DOB: 11-Oct-1975, 37 y.o.   MRN: [id number] Follow up - Trauma Critical Care  Patient Details:    Albert [name] is an 37 y.o. male.  Lines/tubes : Airway 7.5 mm (Active)  Secured at (cm) 23 cm 12/19/2012  7:48 AM  Measured From Lips 12/19/2012  7:48 AM  Secured Location Center 12/19/2012  7:48 AM  Secured By [employer] 12/19/2012  7:48 AM  Tube Holder Repositioned Yes 12/19/2012  7:48 AM  Cuff Pressure (cm H2O) 23 cm H2O 12/17/2012 12:44 PM  Site Condition Dry 12/19/2012  7:48 AM     Arterial Line 12/15/12 Right Radial (Active)  Site Assessment Clean;Dry;Intact 12/18/2012  8:00 PM  Line Status Pulsatile blood flow 12/18/2012  8:00 PM  Art Line Waveform Whip 12/18/2012  8:00 PM  Art Line Interventions Connections checked and tightened;Leveled;Zeroed and calibrated 12/18/2012  8:00 PM  Color/Movement/Sensation Capillary refill less than 3 sec 12/18/2012  8:00 PM  Dressing Type Transparent 12/18/2012  8:00 PM  Dressing Status Clean;Dry;Intact 12/18/2012  8:00 PM     Closed System Drain 1 Left;Lateral Other (Comment) Bulb (JP) 10 Fr. (Active)  Site Description Unremarkable 12/18/2012  8:00 AM  Dressing Status None 12/18/2012  8:00 PM  Drainage Appearance Bloody 12/18/2012  8:00 PM  Status To suction (Charged) 12/18/2012  8:00 PM  Intake (mL) 30 ml 12/19/2012 12:00 AM  Output (mL) 55 mL 12/18/2012  3:00 PM     NG/OG Tube Orogastric 16 Fr. Right mouth (Active)  Placement Verification Auscultation 12/19/2012 12:06 AM  Site Assessment Clean;Dry;Intact 12/19/2012 12:06 AM  Status Infusing tube feed 12/19/2012 12:06 AM  Drainage Appearance Tan 12/18/2012  8:00 AM  Gastric Residual 0 mL 12/19/2012 12:06 AM  Intake (mL) 65 mL 12/19/2012  9:00 AM     Urethral Catheter Latex;Straight-tip;Temperature probe 16 Fr. (Active)  Indication for Insertion or Continuance of Catheter Urinary output monitoring 12/18/2012  8:00 PM  Site Assessment Clean;Intact 12/18/2012  8:00 PM   Collection Container Standard drainage bag 12/18/2012  8:00 PM  Securement Method Leg strap 12/18/2012  8:00 PM  Urinary Catheter Interventions Unclamped 12/18/2012  8:00 PM  Output (mL) 240 mL 12/16/2012  1:00 PM    Microbiology/Sepsis markers: Results for orders placed during the hospital encounter of 12/14/12  CULTURE, BLOOD (ROUTINE X 2)     Status: None   Collection Time    12/16/12 10:50 AM      Result Value Range Status   Specimen Description BLOOD LEFT HAND   Final   Special Requests BOTTLES DRAWN AEROBIC ONLY 3CC   Final   Culture  Setup Time 12/16/2012 14:21   Final   Culture     Final   Value:        BLOOD CULTURE RECEIVED NO GROWTH TO DATE CULTURE WILL BE HELD FOR 5 DAYS BEFORE ISSUING A FINAL NEGATIVE REPORT   Report Status PENDING   Incomplete  CULTURE, BLOOD (ROUTINE X 2)     Status: None   Collection Time    12/16/12 10:58 AM      Result Value Range Status   Specimen Description BLOOD RIGHT ARM   Final   Special Requests BOTTLES DRAWN AEROBIC AND ANAEROBIC 10CC   Final   Culture  Setup Time 12/16/2012 14:21   Final   Culture     Final   Value:        BLOOD CULTURE RECEIVED NO GROWTH TO DATE CULTURE WILL  BE HELD FOR 5 DAYS BEFORE ISSUING A FINAL NEGATIVE REPORT   Report Status PENDING   Incomplete  URINE CULTURE     Status: None   Collection Time    12/16/12 12:03 PM      Result Value Range Status   Specimen Description URINE, CATHETERIZED   Final   Special Requests Normal   Final   Culture  Setup Time 12/16/2012 12:46   Final   Colony Count NO GROWTH   Final   Culture NO GROWTH   Final   Report Status 12/17/2012 FINAL   Final  CULTURE, RESPIRATORY (NON-EXPECTORATED)     Status: None   Collection Time    12/16/12  4:07 PM      Result Value Range Status   Specimen Description TRACHEAL ASPIRATE   Final   Special Requests NONE   Final   Gram Stain     Final   Value: ABUNDANT WBC PRESENT, PREDOMINANTLY PMN     RARE SQUAMOUS EPITHELIAL CELLS PRESENT     MODERATE  GRAM NEGATIVE COCCI     MODERATE GRAM POSITIVE COCCI     IN PAIRS   Culture Culture reincubated for better growth   Final   Report Status PENDING   Incomplete    Anti-infectives:  Anti-infectives   Start     Dose/Rate Route Frequency Ordered Stop   12/18/12 2100  piperacillin-tazobactam (ZOSYN) IVPB 3.375 g     3.375 g 12.5 mL/hr over 240 Minutes Intravenous Every 8 hours 12/18/12 1852     12/18/12 2000  vancomycin (VANCOCIN) IVPB 1000 mg/200 mL premix     1,000 mg 200 mL/hr over 60 Minutes Intravenous Every 8 hours 12/18/12 1852     12/15/12 0800  ceFAZolin (ANCEF) IVPB 1 g/50 mL premix     1 g 100 mL/hr over 30 Minutes Intravenous Every 8 hours 12/15/12 0413 12/15/12 1604      Best Practice/Protocols:  VTE Prophylaxis: Mechanical Intermittent Sedation  Consults: Treatment Team:  Md Pccm, MD Trauma Md, MD    Studies:    Events:  Subjective:    Overnight Issues:   Objective:  Vital signs for last 24 hours: Temp:  [99.3 F (37.4 C)-102.6 F (39.2 C)] 99.3 F (37.4 C) (04/10 0900) Pulse Rate:  [54-98] 69 (04/10 0900) Resp:  [15-29] 24 (04/10 0900) BP: (130-196)/(66-99) 154/89 mmHg (04/10 0900) SpO2:  [98 %-100 %] 100 % (04/10 0900) Arterial Line BP: (171-200)/(72-92) 200/87 mmHg (04/10 0900) FiO2 (%):  [40 %] 40 % (04/10 0900) Weight:  [90 kg (198 lb 6.6 oz)] 90 kg (198 lb 6.6 oz) (04/10 0500)  Hemodynamic parameters for last 24 hours:    Intake/Output from previous day: 04/09 0701 - 04/10 0700 In: 2740 [P.O.:40; NG/GT:1170; IV Piggyback:1220] Out: 2540 [Urine:2485; Drains:55]  Intake/Output this shift: Total I/O In: 150 [Other:20; NG/GT:130] Out: 225 [Urine:225]  Vent settings for last 24 hours: Vent Mode:  [-] CPAP;PSV FiO2 (%):  [40 %] 40 % Set Rate:  [16 bmp] 16 bmp Vt Set:  [600 mL] 600 mL PEEP:  [5 cmH20] 5 cmH20 Pressure Support:  [5 cmH20] 5 cmH20 Plateau Pressure:  [10 cmH20-18 cmH20] 11 cmH20  Physical Exam:  General: on  vent Neuro: PERL 4mm, opens eyes to voice and looks toward sound but not F/C HEENT/Neck: scalp incision CDI with staples Resp: clear to auscultation bilaterally CVS: RRR GI: soft, NT, +BS, flap L abdomen Extremities: no edema, no erythema, pulses WNL  Results for orders placed  during the hospital encounter of 12/14/12 (from the past 24 hour(s))  GLUCOSE, CAPILLARY     Status: Abnormal   Collection Time    12/18/12 11:43 AM      Result Value Range   Glucose-Capillary 142 (*) 70 - 99 mg/dL  GLUCOSE, CAPILLARY     Status: Abnormal   Collection Time    12/18/12  4:06 PM      Result Value Range   Glucose-Capillary 157 (*) 70 - 99 mg/dL   Comment 1 Notify RN     Comment 2 Documented in Chart    GLUCOSE, CAPILLARY     Status: Abnormal   Collection Time    12/18/12  8:09 PM      Result Value Range   Glucose-Capillary 144 (*) 70 - 99 mg/dL  GLUCOSE, CAPILLARY     Status: Abnormal   Collection Time    12/18/12 11:53 PM      Result Value Range   Glucose-Capillary 158 (*) 70 - 99 mg/dL   Comment 1 Notify RN    GLUCOSE, CAPILLARY     Status: Abnormal   Collection Time    12/19/12  4:16 AM      Result Value Range   Glucose-Capillary 153 (*) 70 - 99 mg/dL   Comment 1 Notify RN    CBC     Status: Abnormal   Collection Time    12/19/12  4:20 AM      Result Value Range   WBC 14.0 (*) 4.0 - 10.5 K/uL   RBC 4.37  4.22 - 5.81 MIL/uL   Hemoglobin 12.7 (*) 13.0 - 17.0 g/dL   HCT 16.1 (*) 09.6 - 04.5 %   MCV 84.9  78.0 - 100.0 fL   MCH 29.1  26.0 - 34.0 pg   MCHC 34.2  30.0 - 36.0 g/dL   RDW 40.9  81.1 - 91.4 %   Platelets 255  150 - 400 K/uL  BASIC METABOLIC PANEL     Status: Abnormal   Collection Time    12/19/12  4:20 AM      Result Value Range   Sodium 141  135 - 145 mEq/L   Potassium 3.6  3.5 - 5.1 mEq/L   Chloride 103  96 - 112 mEq/L   CO2 29  19 - 32 mEq/L   Glucose, Bld 176 (*) 70 - 99 mg/dL   BUN 23  6 - 23 mg/dL   Creatinine, Ser 7.82  0.50 - 1.35 mg/dL   Calcium 9.6   8.4 - 95.6 mg/dL   GFR calc non Af Amer >90  >90 mL/min   GFR calc Af Amer >90  >90 mL/min  MAGNESIUM     Status: Abnormal   Collection Time    12/19/12  4:20 AM      Result Value Range   Magnesium 2.6 (*) 1.5 - 2.5 mg/dL  PHOSPHORUS     Status: None   Collection Time    12/19/12  4:20 AM      Result Value Range   Phosphorus 3.3  2.3 - 4.6 mg/dL  BLOOD GAS, ARTERIAL     Status: Abnormal   Collection Time    12/19/12  4:23 AM      Result Value Range   FIO2 0.40     Delivery systems VENTILATOR     Mode PRESSURE REGULATED VOLUME CONTROL     VT 600     Rate 16     Peep/cpap 5.0  pH, Arterial 7.469 (*) 7.350 - 7.450   pCO2 arterial 37.3  35.0 - 45.0 mmHg   pO2, Arterial 162.0 (*) 80.0 - 100.0 mmHg   Bicarbonate 26.8 (*) 20.0 - 24.0 mEq/L   TCO2 27.9  0 - 100 mmol/L   Acid-Base Excess 3.2 (*) 0.0 - 2.0 mmol/L   O2 Saturation 99.3     Patient temperature 98.6     Collection site A-LINE     Drawn by [phone]     Sample type ARTERIAL DRAW    GLUCOSE, CAPILLARY     Status: Abnormal   Collection Time    12/19/12  8:11 AM      Result Value Range   Glucose-Capillary 122 (*) 70 - 99 mg/dL    Assessment & Plan: Present on Admission:  . Respiratory failure following trauma and surgery . Cardiac arrest . Elevated ETOH level   LOS: 5 days   Additional comments:I reviewed the patient's new clinical lab test results. and CXR Fall TBI s/p decompressive craniectomy -- S/P decompressive craniectomy, Dr. Jeral Fruit following, arouses but still not F/C, TBI team therapies Facial abrasions -- Local care VDRF -- weaning well, work on 5/5 during the day, needs to F/C prior to extubation ID -- vanc/zosyn empiric, resp CX is P, blood and urine CX neg so far FEN -- tol TF VTE -- SCD's Dispo -- VDRF I spoke to his sister at the bedside Critical Care Total Time*: 87 Minutes  Violeta Gelinas, MD, MPH, FACS Pager: [phone]  12/19/2012  *Care during the described time interval was provided  by me and/or other providers on the critical care team.  I have reviewed this patient's available data, including medical history, events of note, physical examination and test results as part of my evaluation.

## 2012-12-20 ENCOUNTER — Inpatient Hospital Stay (HOSPITAL_COMMUNITY): Payer: BC Managed Care – PPO

## 2012-12-20 DIAGNOSIS — E46 Unspecified protein-calorie malnutrition: Secondary | ICD-10-CM

## 2012-12-20 LAB — GLUCOSE, CAPILLARY
Glucose-Capillary: 118 mg/dL — ABNORMAL HIGH (ref 70–99)
Glucose-Capillary: 134 mg/dL — ABNORMAL HIGH (ref 70–99)
Glucose-Capillary: 135 mg/dL — ABNORMAL HIGH (ref 70–99)
Glucose-Capillary: 135 mg/dL — ABNORMAL HIGH (ref 70–99)
Glucose-Capillary: 151 mg/dL — ABNORMAL HIGH (ref 70–99)

## 2012-12-20 LAB — BASIC METABOLIC PANEL
BUN: 26 mg/dL — ABNORMAL HIGH (ref 6–23)
CO2: 29 mEq/L (ref 19–32)
Calcium: 9.8 mg/dL (ref 8.4–10.5)
Chloride: 103 mEq/L (ref 96–112)
Creatinine, Ser: 0.51 mg/dL (ref 0.50–1.35)
GFR calc Af Amer: >90 mL/min (ref >90)
GFR calc non Af Amer: >90 mL/min (ref >90)
Glucose, Bld: 174 mg/dL — ABNORMAL HIGH (ref 70–99)
Potassium: 3.7 mEq/L (ref 3.5–5.1)
Sodium: 140 mEq/L (ref 135–145)

## 2012-12-20 LAB — CBC
HCT: 37 % — ABNORMAL LOW (ref 39.0–52.0)
Hemoglobin: 12.6 g/dL — ABNORMAL LOW (ref 13.0–17.0)
MCH: 28.5 pg (ref 26.0–34.0)
MCHC: 34.1 g/dL (ref 30.0–36.0)
MCV: 83.7 fL (ref 78.0–100.0)
Platelets: 294 10*3/uL (ref 150–400)
RBC: 4.42 MIL/uL (ref 4.22–5.81)
RDW: 13.1 % (ref 11.5–15.5)
WBC: 16.4 10*3/uL — ABNORMAL HIGH (ref 4.0–10.5)

## 2012-12-20 LAB — CULTURE, RESPIRATORY W GRAM STAIN

## 2012-12-20 LAB — CULTURE, RESPIRATORY

## 2012-12-20 MED ORDER — LEVOFLOXACIN IN D5W 750 MG/150ML IV SOLN
750.0000 mg | INTRAVENOUS | Status: DC
  Administered 2012-12-20 – 2012-12-28 (×9): 750 mg via INTRAVENOUS
  Filled 2012-12-20 (×9): qty 150

## 2012-12-20 MED ORDER — DEXTROSE 5 % IV SOLN
1.0000 g | Freq: Three times a day (TID) | INTRAVENOUS | Status: DC
  Administered 2012-12-20 – 2012-12-26 (×18): 1 g via INTRAVENOUS
  Filled 2012-12-20 (×22): qty 1

## 2012-12-20 MED ORDER — METOPROLOL TARTRATE 25 MG/10 ML ORAL SUSPENSION
25.0000 mg | Freq: Two times a day (BID) | ORAL | Status: DC
  Administered 2012-12-20 – 2012-12-22 (×6): 25 mg
  Filled 2012-12-20 (×8): qty 10

## 2012-12-20 NOTE — Progress Notes (Signed)
Speech Language Pathology Treatment Patient Details Name: Albert [name] MRN: [id number] DOB: Jul 22, 1976 Today's Date: 12/20/2012 Time: 4098-1191 SLP Time Calculation (min): 15 min  Assessment / Plan / Recommendation Clinical Impression  SLP provided skilled opportunities for increase response today including use of visual, auditory, and tactile stimulation. Patient responded with an increase in generalized responses today characterized by wide eye opening, raised eyebrows, turning head from side to side, and movement of left upper extremity. Localized responses characterized by withdrawl to pain in all four extremities. Despite eye opening and appearance of eye contact 2-3 times to clinician provided cues, patient without blink to visual threat. Continues to present in a Rancho Level II (generalized response) with emerging behaviors of a Rancho Level III (localized response). SLP will continue to f/u.     SLP Plan  Continue with current plan of care    Pertinent Vitals/Pain None reported  SLP Goals  SLP Goals Potential to Achieve Goals: Good SLP Goal #1: Pt will follow one step commands with max verbal/tactile cues x3.  SLP Goal #1 - Progress: Progressing toward goal SLP Goal #2: Pt will sustain attention to basic functional task for 30 seconds with max contextual cues.  SLP Goal #2 - Progress: Progressing toward goal SLP Goal #3: Pt will verbalize at word level (following extubation ) with max verbal/contextual cues.  SLP Goal #3 - Progress: Progressing toward goal        Treatment Treatment focused on: Coma recovery;Cognition Skilled Treatment: SLP provided skilled opportunities for increase response today including use of visual, auditory, and tactile stimulation. Patient responded with an increase in generalized responses today characterized by wide eye opening, raised eyebrows, turning head from side to side, and movement of left upper extremity. Localized responses  characterized by withdrawl to pain in all four extremities. Despite eye opening and appearance of eye contact 2-3 times to clinician provided cues, patient without blink to visual threat. Continues to present in a Rancho Level II (generalized response) with emerging behaviors of a Rancho Level III (localized response). SLP will continue to f/u.    GO   [name] MA, CCC-SLP [phone]   [name] [name] 12/20/2012, 3:53 PM

## 2012-12-20 NOTE — Progress Notes (Signed)
Occupational Therapy Treatment Patient Details Name: Albert [name] MRN: [id number] DOB: 19-Apr-1976 Today's Date: 12/20/2012 Time: 4098-1191 OT Time Calculation (min): 31 min  OT Assessment / Plan / Recommendation Comments on Treatment Session 37 s/p fall (ETOH) related with lt fronto-temporo- parietal SDH with craniotomy (bone flap lt abdomen) Pt remains rancho II (generalized response) . Items present in room for TBI team use    Follow Up Recommendations  CIR    Barriers to Discharge       Equipment Recommendations  Other (comment) (TBA)    Recommendations for Other Services Rehab consult  Frequency Min 3X/week   Plan Discharge plan remains appropriate    Precautions / Restrictions Precautions Precautions: Fall;Other (comment) Precaution Comments: pt with L Crani with drain and bone flap in L UQ.     Pertinent Vitals/Pain Coughing while on vent Chewing on vent when aroused at EOB    ADL  Eating/Feeding: NPO ADL Comments: Pt more aroused this session on the vent . Pt opening eyes spontaneously Pt assisted with supine to EOB sitting to help with arousal. pt opening eyes but without focus on therapist or object. pt with eyes deviating to the left however demonstrates ability to horizontally shift to the right.wife arrived at end of session with photo books, tshirt with perfume and cds present in room. wife updated on session at [facility] II emerging III.      OT Diagnosis:    OT Problem List:   OT Treatment Interventions:     OT Goals Acute Rehab OT Goals OT Goal Formulation: With family Time For Goal Achievement: 01/02/13 Potential to Achieve Goals: Good ADL Goals Pt Will Perform Grooming: with max assist;Supported;Sitting, edge of bed;with cueing (comment type and amount) ADL Goal: Grooming - Progress: Not progressing Miscellaneous OT Goals Miscellaneous OT Goal #1: Pt will follow 1 step simple command 2 out 5 trialls OT Goal: Miscellaneous Goal #1 - Progress: Not  progressing Miscellaneous OT Goal #2: Pt will demonstrate localized response 3 out 4 trials OT Goal: Miscellaneous Goal #2 - Progress: Not progressing Miscellaneous OT Goal #3: Pt will tolerate EOB sitting ~5 minutes with max (A) OT Goal: Miscellaneous Goal #3 - Progress: Progressing toward goals  Visit Information  Last OT Received On: 12/20/12 Assistance Needed: +2 PT/OT Co-Evaluation/Treatment: Yes    Subjective Data      Prior Functioning       Cognition  Cognition Overall Cognitive Status: Impaired Area of Impairment: Attention;Following commands;Rancho level Arousal/Alertness: Lethargic Behavior During Session: Lethargic Current Attention Level: Other (comment) (open eyes to name call briefly) Cognition - Other Comments: Pt resting with eyes closed on arrival. When name called loudly, he will open his eyes ~1/3 of the time. Eyes typically directed to his left, however did direct them to the Rt at times. Pt assisted to sit on the EOB to attempt incr arousal and participation, however he remained lethargic with most of the time eyes closed. Did not attempt to grip with either hand. Was chewing on ETT when more alert. Upright sitting time limited by incr coughing/gagging due to ETT.    Mobility  Bed Mobility Bed Mobility: Supine to Sit;Sit to Supine;Rolling Right;Rolling Left Rolling Right: 1: +2 Total assist Rolling Right: Patient Percentage: 0% Rolling Left: 1: +2 Total assist Rolling Left: Patient Percentage: 0% Supine to Sit: 1: +2 Total assist;HOB elevated Supine to Sit: Patient Percentage: 0% Sit to Supine: 1: +2 Total assist;HOB elevated Sit to Supine: Patient Percentage: 0% Details for Bed Mobility Assistance:  Pt too lethargic to assist with bed mobility, although cued throughout Transfers Transfers: Not assessed    Exercises  Other Exercises Other Exercises: Noted reflexive flexion of LUE and LLE with incr coughing   Balance Balance Balance Assessed:  Yes Static Sitting Balance Static Sitting - Balance Support: Feet unsupported;No upper extremity supported Static Sitting - Level of Assistance: 1: +2 Total assist Static Sitting - Comment/# of Minutes: pt varied 0-10% as he at times assisted with holding his head upright. Mostly low tone while sitting EOB   End of Session OT - End of Session Activity Tolerance: Patient tolerated treatment well Patient left: in bed;with call bell/phone within reach;with family/visitor present Nurse Communication: Mobility status;Precautions  GO     Lucile Shutters 12/20/2012, 1:24 PM Pager: [phone]

## 2012-12-20 NOTE — Clinical Social Work Note (Signed)
Clinical Social Worker continuing to follow patient and family for support and discharge planning needs.  Patient remains intubated at this time, opening eyes but not following commands.  Patient family at bedside and acknowledged that they are doing well and do not have any immediate needs at this time.  CSW will remain available for support and to facilitate appropriate discharge needs once patient is medically stable.  Macario Golds, [place] [phone]

## 2012-12-20 NOTE — Progress Notes (Signed)
Physical Therapy Treatment (TBI team) Patient Details Name: Albert [name] MRN: [id number] DOB: Sep 22, 1975 Today's Date: 12/20/2012 Time: 1478-2956 PT Time Calculation (min): 29 min  PT Assessment / Plan / Recommendation Comments on Treatment Session  37 yo s/p fall (ETOH related) with Lt fronto-temporo-parietal SDH with craniotomy (bone flap placed in Lt abd). Pt remains a Rancho level II (Generalized Response). Wife arrived at end of session and updated on pt's status. She has brought in multiple items to use during therapy sessions (family photo books, tshirt with her perfume)    Follow Up Recommendations  CIR     Does the patient have the potential to tolerate intense rehabilitation     Barriers to Discharge        Equipment Recommendations       Recommendations for Other Services Rehab consult  Frequency Min 3X/week   Plan Discharge plan remains appropriate;Frequency remains appropriate    Precautions / Restrictions Precautions Precautions: Fall;Other (comment) Precaution Comments: pt with L Crani with drain and bone flap in L UQ.   Restrictions Weight Bearing Restrictions: No   Pertinent Vitals/Pain On vent PS/CPAP with PEEP 5 and FiO2 40% RR 27-40 (with lots of coughing when sitting)  No indications of pain    Mobility  Bed Mobility Bed Mobility: Supine to Sit;Sit to Supine;Rolling Right;Rolling Left Rolling Right: 1: +2 Total assist Rolling Right: Patient Percentage: 0% Rolling Left: 1: +2 Total assist Rolling Left: Patient Percentage: 0% Supine to Sit: 1: +2 Total assist;HOB elevated Supine to Sit: Patient Percentage: 0% Sit to Supine: 1: +2 Total assist;HOB elevated Sit to Supine: Patient Percentage: 0% Details for Bed Mobility Assistance: Pt too lethargic to assist with bed mobility, although cued throughout    Exercises Other Exercises Other Exercises: Noted reflexive flexion of LUE and LLE with incr coughing   PT Diagnosis:    PT Problem List:    PT Treatment Interventions:     PT Goals Acute Rehab PT Goals Pt will go Supine/Side to Sit: with mod assist PT Goal: Supine/Side to Sit - Progress: Progressing toward goal Pt will Sit at Edge of Bed: with min assist;3-5 min;with unilateral upper extremity support PT Goal: Sit at Edge Of Bed - Progress: Progressing toward goal Pt will go Sit to Supine/Side: with mod assist PT Goal: Sit to Supine/Side - Progress: Progressing toward goal Additional Goals Additional Goal #1: pt will visually attend to auditory and visual stimuli for >5 seconds.   PT Goal: Additional Goal #1 - Progress: Progressing toward goal Additional Goal #2: pt will follow one step directions 50% of time.   PT Goal: Additional Goal #2 - Progress: Not progressing  Visit Information  Last PT Received On: 12/20/12 Assistance Needed: +2 PT/OT Co-Evaluation/Treatment: Yes    Subjective Data      Cognition  Cognition Overall Cognitive Status: Impaired Area of Impairment: Attention;Following commands;Rancho level Arousal/Alertness: Lethargic Behavior During Session: Lethargic Current Attention Level: Other (comment) (only briefly opens eyes to name, does not direct gaze at per) Cognition - Other Comments: Pt resting with eyes closed on arrival. When name called loudly, he will open his eyes ~1/3 of the time. Eyes typically directed to his left, however did direct them to the Rt at times. Pt assisted to sit on the EOB to attempt incr arousal and participation, however he remained lethargic with most of the time eyes closed. Did not attempt to grip with either hand. Was chewing on ETT when more alert. Upright sitting time limited by  incr coughing/gagging due to ETT.    Balance  Balance Balance Assessed: Yes Static Sitting Balance Static Sitting - Balance Support: Feet unsupported;No upper extremity supported Static Sitting - Level of Assistance: 1: +2 Total assist Static Sitting - Comment/# of Minutes: pt varied  0-10% as he at times assisted with holding his head upright. Mostly low tone while sitting EOB  End of Session PT - End of Session Activity Tolerance: Patient limited by fatigue;Treatment limited secondary to medical complications (Comment) (coughing/gagging on ETT) Patient left: in bed;with call bell/phone within reach;with nursing in room;with family/visitor present Nurse Communication: Mobility status   GP     Tyjai Matuszak 12/20/2012, 11:15 AM  12/20/2012 Veda Canning, PT Pager: [phone]

## 2012-12-20 NOTE — Progress Notes (Signed)
Follow up - Trauma and Critical Care  Patient Details:    Albert [name] is an 37 y.o. male.  Lines/tubes : Airway 7.5 mm (Active)  Secured at (cm) 23 cm 12/20/2012  7:29 AM  Measured From Lips 12/20/2012  7:29 AM  Secured Location Right 12/20/2012  7:29 AM  Secured By [employer] 12/20/2012  7:29 AM  Tube Holder Repositioned Yes 12/20/2012  7:29 AM  Cuff Pressure (cm H2O) 24 cm H2O 12/19/2012  7:28 PM  Site Condition Dry 12/20/2012  7:29 AM     PICC / Midline Double Lumen 12/19/12 PICC Left Basilic (Active)  Indication for Insertion or Continuance of Line Prolonged intravenous therapies 12/19/2012  8:00 PM  Site Assessment Clean;Dry;Intact 12/19/2012  8:00 PM  Lumen #1 Status Flushed;Saline locked;Blood return noted 12/19/2012  8:00 PM  Lumen #2 Status Flushed;Saline locked;Blood return noted 12/19/2012  8:00 PM  Dressing Type Transparent;Occlusive 12/19/2012  8:00 PM  Dressing Status Clean;Dry;Intact 12/19/2012  8:00 PM  Line Care Connections checked and tightened 12/19/2012  8:00 PM     Arterial Line 12/15/12 Right Radial (Active)  Site Assessment Clean;Dry;Intact 12/19/2012  8:00 PM  Line Status Pulsatile blood flow 12/19/2012  8:00 PM  Art Line Waveform Whip 12/19/2012  8:00 PM  Art Line Interventions Zeroed and calibrated;Connections checked and tightened;Flushed per protocol;Leveled 12/19/2012  8:00 PM  Color/Movement/Sensation Capillary refill less than 3 sec 12/19/2012  8:00 PM  Dressing Type Transparent 12/19/2012  8:00 PM  Dressing Status Clean;Dry;Intact 12/19/2012  8:00 PM     Closed System Drain 1 Left;Lateral Other (Comment) Bulb (JP) 10 Fr. (Active)  Site Description Unremarkable 12/19/2012  8:00 AM  Dressing Status None 12/19/2012  8:00 PM  Drainage Appearance Bloody 12/19/2012  8:00 PM  Status To suction (Charged) 12/19/2012  8:00 PM  Intake (mL) 30 ml 12/19/2012 12:00 AM  Output (mL) 35 mL 12/20/2012  6:00 AM     NG/OG Tube Orogastric 16 Fr. Right mouth (Active)   Placement Verification Auscultation 12/20/2012  4:00 AM  Site Assessment Clean;Dry;Intact 12/20/2012  4:00 AM  Status Infusing tube feed 12/20/2012  4:00 AM  Drainage Appearance Tan 12/19/2012  8:00 PM  Gastric Residual 0 mL 12/20/2012 12:00 AM  Intake (mL) 65 mL 12/20/2012  6:00 AM     Urethral Catheter Latex;Straight-tip;Temperature probe 16 Fr. (Active)  Indication for Insertion or Continuance of Catheter Urinary output monitoring 12/19/2012  8:00 PM  Site Assessment Clean;Intact 12/19/2012  8:00 PM  Collection Container Standard drainage bag 12/19/2012  8:00 PM  Securement Method Leg strap 12/19/2012  8:00 PM  Urinary Catheter Interventions Unclamped 12/19/2012  8:00 PM  Output (mL) 240 mL 12/16/2012  1:00 PM    Microbiology/Sepsis markers: Results for orders placed during the hospital encounter of 12/14/12  CULTURE, BLOOD (ROUTINE X 2)     Status: None   Collection Time    12/16/12 10:50 AM      Result Value Range Status   Specimen Description BLOOD LEFT HAND   Final   Special Requests BOTTLES DRAWN AEROBIC ONLY 3CC   Final   Culture  Setup Time 12/16/2012 14:21   Final   Culture     Final   Value:        BLOOD CULTURE RECEIVED NO GROWTH TO DATE CULTURE WILL BE HELD FOR 5 DAYS BEFORE ISSUING A FINAL NEGATIVE REPORT   Report Status PENDING   Incomplete  CULTURE, BLOOD (ROUTINE X 2)     Status: None  Collection Time    12/16/12 10:58 AM      Result Value Range Status   Specimen Description BLOOD RIGHT ARM   Final   Special Requests BOTTLES DRAWN AEROBIC AND ANAEROBIC 10CC   Final   Culture  Setup Time 12/16/2012 14:21   Final   Culture     Final   Value:        BLOOD CULTURE RECEIVED NO GROWTH TO DATE CULTURE WILL BE HELD FOR 5 DAYS BEFORE ISSUING A FINAL NEGATIVE REPORT   Report Status PENDING   Incomplete  URINE CULTURE     Status: None   Collection Time    12/16/12 12:03 PM      Result Value Range Status   Specimen Description URINE, CATHETERIZED   Final   Special Requests  Normal   Final   Culture  Setup Time 12/16/2012 12:46   Final   Colony Count NO GROWTH   Final   Culture NO GROWTH   Final   Report Status 12/17/2012 FINAL   Final  CULTURE, RESPIRATORY (NON-EXPECTORATED)     Status: None   Collection Time    12/16/12  4:07 PM      Result Value Range Status   Specimen Description TRACHEAL ASPIRATE   Final   Special Requests NONE   Final   Gram Stain     Final   Value: ABUNDANT WBC PRESENT, PREDOMINANTLY PMN     RARE SQUAMOUS EPITHELIAL CELLS PRESENT     MODERATE GRAM NEGATIVE COCCI     MODERATE GRAM POSITIVE COCCI     IN PAIRS   Culture Culture reincubated for better growth   Final   Report Status 12/20/2012 FINAL   Final    Anti-infectives:  Anti-infectives   Start     Dose/Rate Route Frequency Ordered Stop   12/18/12 2100  piperacillin-tazobactam (ZOSYN) IVPB 3.375 g  Status:  Discontinued     3.375 g 12.5 mL/hr over 240 Minutes Intravenous Every 8 hours 12/18/12 1852 12/19/12 1554   12/18/12 2000  vancomycin (VANCOCIN) IVPB 1000 mg/200 mL premix  Status:  Discontinued     1,000 mg 200 mL/hr over 60 Minutes Intravenous Every 8 hours 12/18/12 1852 12/19/12 1554   12/15/12 0800  ceFAZolin (ANCEF) IVPB 1 g/50 mL premix     1 g 100 mL/hr over 30 Minutes Intravenous Every 8 hours 12/15/12 0413 12/15/12 1604      Best Practice/Protocols:  VTE Prophylaxis: Mechanical GI Prophylaxis: Proton Pump Inhibitor None  Consults: Treatment Team:  Trauma Md, MD    Events:  Subjective:    Overnight Issues: Patient having fevers.  Had a rash from Vancomycin and Zosyn being given. n Needs empiric antibiotics, Will start Levo.  Objective:  Vital signs for last 24 hours: Temp:  [98.8 F (37.1 C)-101.1 F (38.4 C)] 100.4 F (38 C) (04/11 0700) Pulse Rate:  [55-97] 97 (04/11 0729) Resp:  [15-29] 29 (04/11 0729) BP: (135-192)/(72-107) 192/87 mmHg (04/11 0729) SpO2:  [100 %] 100 % (04/11 0729) Arterial Line BP: (182-207)/(73-94) 186/84 mmHg  (04/11 0700) FiO2 (%):  [40 %] 40 % (04/11 0729) Weight:  [95.2 kg (209 lb 14.1 oz)] 95.2 kg (209 lb 14.1 oz) (04/11 0500)  Hemodynamic parameters for last 24 hours:    Intake/Output from previous day: 04/10 0701 - 04/11 0700 In: 2537.5 [P.O.:250; WU/JW:1191; IV Piggyback:422.5] Out: 2170 [Urine:2115; Drains:55]  Intake/Output this shift:    Vent settings for last 24 hours: Vent Mode:  [-] PSV;CPAP  FiO2 (%):  [40 %] 40 % Set Rate:  [16 bmp] 16 bmp Vt Set:  [600 mL] 600 mL PEEP:  [5 cmH20] 5 cmH20 Pressure Support:  [5 cmH20] 5 cmH20 Plateau Pressure:  [15 cmH20-16 cmH20] 16 cmH20  Physical Exam:  General: no respiratory distress Neuro: weakness right upper extremity and weakness right lower extremity Resp: clear to auscultation bilaterally CVS: regular rate and rhythm, S1, S2 normal, no murmur, click, rub or gallop GI: soft, nontender, BS WNL, no r/g and wound clean Extremities: no edema, no erythema, pulses WNL The patient's bone flap was placed very close to where G-tube would normally be placed  Results for orders placed during the hospital encounter of 12/14/12 (from the past 24 hour(s))  GLUCOSE, CAPILLARY     Status: Abnormal   Collection Time    12/19/12  8:11 AM      Result Value Range   Glucose-Capillary 122 (*) 70 - 99 mg/dL  GLUCOSE, CAPILLARY     Status: Abnormal   Collection Time    12/19/12 12:04 PM      Result Value Range   Glucose-Capillary 116 (*) 70 - 99 mg/dL  GLUCOSE, CAPILLARY     Status: Abnormal   Collection Time    12/19/12  3:56 PM      Result Value Range   Glucose-Capillary 132 (*) 70 - 99 mg/dL   Comment 1 Notify RN     Comment 2 Documented in Chart    GLUCOSE, CAPILLARY     Status: Abnormal   Collection Time    12/19/12  7:45 PM      Result Value Range   Glucose-Capillary 146 (*) 70 - 99 mg/dL   Comment 1 Notify RN     Comment 2 Documented in Chart    GLUCOSE, CAPILLARY     Status: Abnormal   Collection Time    12/20/12 12:25 AM       Result Value Range   Glucose-Capillary 151 (*) 70 - 99 mg/dL  CBC     Status: Abnormal   Collection Time    12/20/12  5:50 AM      Result Value Range   WBC 16.4 (*) 4.0 - 10.5 K/uL   RBC 4.42  4.22 - 5.81 MIL/uL   Hemoglobin 12.6 (*) 13.0 - 17.0 g/dL   HCT 78.2 (*) 95.6 - 21.3 %   MCV 83.7  78.0 - 100.0 fL   MCH 28.5  26.0 - 34.0 pg   MCHC 34.1  30.0 - 36.0 g/dL   RDW 08.6  57.8 - 46.9 %   Platelets 294  150 - 400 K/uL  BASIC METABOLIC PANEL     Status: Abnormal   Collection Time    12/20/12  5:50 AM      Result Value Range   Sodium 140  135 - 145 mEq/L   Potassium 3.7  3.5 - 5.1 mEq/L   Chloride 103  96 - 112 mEq/L   CO2 29  19 - 32 mEq/L   Glucose, Bld 174 (*) 70 - 99 mg/dL   BUN 26 (*) 6 - 23 mg/dL   Creatinine, Ser 6.29  0.50 - 1.35 mg/dL   Calcium 9.8  8.4 - 52.8 mg/dL   GFR calc non Af Amer >90  >90 mL/min   GFR calc Af Amer >90  >90 mL/min     Assessment/Plan:   NEURO  Altered Mental Status:  Not following commands   Plan: Has not received any  sedation fro sevarl days.  PULM  No specific issues that we know of, but his culture was sent several days ago, and has purulent sputum   Plan: Start empiric Levo  CARDIO  No issuesCPM   Plan: CPM  RENAL  No specific issues   Plan: CPM  GI  Toleratingtube feedings.  No specific issues   Plan: CPM  ID  Pneumonia (hospital acquired (not ventilator-associated) cultures are pending)   Plan: Empiric Levo  HEME  Hemoglobin is down slightly but not mcuh   Plan: No blood  ENDO No specific issues   Plan: CPM  Global Issues  Although the patient likely has a pulmonary infection, his function is great and would allow for extubation if he were more alert and followed commands.  Even as he is he could be extubated, but I believe that within 48-72 hours he would faily and require reintubation.  We could be headed for tracheostomy, and I want to talk to the patient's wife about this.  Cannot perform PEG because of the  placement of the Bone flap.    LOS: 6 days   Additional comments:I reviewed the patient's new clinical lab test results. cbc/bmet and I reviewed the patients new imaging test results. cxr  Critical Care Total Time*: 30 Minutes  Louvinia Cumbo O 12/20/2012  *Care during the described time interval was provided by me and/or other providers on the critical care team.  I have reviewed this patient's available data, including medical history, events of note, physical examination and test results as part of my evaluation.
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
## 2012-12-20 NOTE — Progress Notes (Addendum)
ANTIBIOTIC CONSULT NOTE - INITIAL  Pharmacy Consult for levaquin Indication: empiric  No Known Allergies  Patient Measurements: Height: 5\' 11"  (180.3 cm) Weight: 209 lb 14.1 oz (95.2 kg) IBW/kg (Calculated) : 75.3   Vital Signs: Temp: 100.4 F (38 C) (04/11 0800) Temp src: Core (Comment) (04/11 0800) BP: 170/92 mmHg (04/11 0800) Pulse Rate: 100 (04/11 0800) Intake/Output from previous day: 04/10 0701 - 04/11 0700 In: 2537.5 [P.O.:250; NG/GT:1525; IV Piggyback:422.5] Out: 2170 [Urine:2115; Drains:55] Intake/Output from this shift: Total I/O In: 85 [Other:20; NG/GT:65] Out: 128 [Urine:120; Drains:8]  Labs:  Recent Labs  12/18/12 0500 12/19/12 0420 12/20/12 0550  WBC 11.2* 14.0* 16.4*  HGB 11.9* 12.7* 12.6*  PLT 239 255 294  CREATININE 0.61 0.56 0.51   Estimated Creatinine Clearance: 151.8 ml/min (by C-G formula based on Cr of 0.51). No results found for this basename: VANCOTROUGH, VANCOPEAK, VANCORANDOM, GENTTROUGH, GENTPEAK, GENTRANDOM, TOBRATROUGH, TOBRAPEAK, TOBRARND, AMIKACINPEAK, AMIKACINTROU, AMIKACIN,  in the last 72 hours   Microbiology: Recent Results (from the past 720 hour(s))  CULTURE, BLOOD (ROUTINE X 2)     Status: None   Collection Time    12/16/12 10:50 AM      Result Value Range Status   Specimen Description BLOOD LEFT HAND   Final   Special Requests BOTTLES DRAWN AEROBIC ONLY 3CC   Final   Culture  Setup Time 12/16/2012 14:21   Final   Culture     Final   Value:        BLOOD CULTURE RECEIVED NO GROWTH TO DATE CULTURE WILL BE HELD FOR 5 DAYS BEFORE ISSUING A FINAL NEGATIVE REPORT   Report Status PENDING   Incomplete  CULTURE, BLOOD (ROUTINE X 2)     Status: None   Collection Time    12/16/12 10:58 AM      Result Value Range Status   Specimen Description BLOOD RIGHT ARM   Final   Special Requests BOTTLES DRAWN AEROBIC AND ANAEROBIC 10CC   Final   Culture  Setup Time 12/16/2012 14:21   Final   Culture     Final   Value:        BLOOD CULTURE  RECEIVED NO GROWTH TO DATE CULTURE WILL BE HELD FOR 5 DAYS BEFORE ISSUING A FINAL NEGATIVE REPORT   Report Status PENDING   Incomplete  URINE CULTURE     Status: None   Collection Time    12/16/12 12:03 PM      Result Value Range Status   Specimen Description URINE, CATHETERIZED   Final   Special Requests Normal   Final   Culture  Setup Time 12/16/2012 12:46   Final   Colony Count NO GROWTH   Final   Culture NO GROWTH   Final   Report Status 12/17/2012 FINAL   Final  CULTURE, RESPIRATORY (NON-EXPECTORATED)     Status: None   Collection Time    12/16/12  4:07 PM      Result Value Range Status   Specimen Description TRACHEAL ASPIRATE   Final   Special Requests NONE   Final   Gram Stain     Final   Value: ABUNDANT WBC PRESENT, PREDOMINANTLY PMN     RARE SQUAMOUS EPITHELIAL CELLS PRESENT     MODERATE GRAM NEGATIVE COCCI     MODERATE GRAM POSITIVE COCCI     IN PAIRS   Culture Culture reincubated for better growth   Final   Report Status 12/20/2012 FINAL   Final    Medical History:  Past Medical History  Diagnosis Date  . Headache     Assessment: Patient is a 37 y.o M known to pharmacy from vanc and zosyn consult.  Abx were started empirically on 4/9 and d/ced on 4/10.  Patient remains febrile with wbc trending up and noted purulent sputum.  All cultures remains negative except for GPC/GNC noted in trach asp . culture from 4/8.  To start levaquin today for empiric coverage.   Plan:  1) levaquin 750mg  IV q24h  Pham, Anh P 12/20/2012,8:32 AM  5:38 PM Adding Cefepime with positive respiratory cultures for Strep pneumo (sensitive to levofloxacin), Moraxella and H. Flu (both beta lactamase positive).    Cefepime 1g IV q12h Follow up SCr, UOP, cultures, clinical course and adjust as clinically indicated.   Thank you for allowing pharmacy to be a part of this patients care team.  Lovenia Kim Pharm.D., BCPS Clinical Pharmacist 12/20/2012 5:40 PM Pager: [phone] Phone:  [phone]

## 2012-12-21 ENCOUNTER — Inpatient Hospital Stay (HOSPITAL_COMMUNITY): Payer: BC Managed Care – PPO

## 2012-12-21 LAB — CBC WITH DIFFERENTIAL/PLATELET
Basophils Absolute: 0 10*3/uL (ref 0.0–0.1)
Basophils Relative: 0 % (ref 0–1)
Eosinophils Absolute: 1.8 10*3/uL — ABNORMAL HIGH (ref 0.0–0.7)
Eosinophils Relative: 10 % — ABNORMAL HIGH (ref 0–5)
HCT: 35.4 % — ABNORMAL LOW (ref 39.0–52.0)
Hemoglobin: 12.2 g/dL — ABNORMAL LOW (ref 13.0–17.0)
Lymphocytes Relative: 8 % — ABNORMAL LOW (ref 12–46)
Lymphs Abs: 1.4 10*3/uL (ref 0.7–4.0)
MCH: 29.3 pg (ref 26.0–34.0)
MCHC: 34.5 g/dL (ref 30.0–36.0)
MCV: 85.1 fL (ref 78.0–100.0)
Monocytes Absolute: 1.4 10*3/uL — ABNORMAL HIGH (ref 0.1–1.0)
Monocytes Relative: 8 % (ref 3–12)
Neutro Abs: 12.9 10*3/uL — ABNORMAL HIGH (ref 1.7–7.7)
Neutrophils Relative %: 74 % (ref 43–77)
Platelets: 294 10*3/uL (ref 150–400)
RBC: 4.16 MIL/uL — ABNORMAL LOW (ref 4.22–5.81)
RDW: 13.1 % (ref 11.5–15.5)
WBC: 17.5 10*3/uL — ABNORMAL HIGH (ref 4.0–10.5)

## 2012-12-21 LAB — BASIC METABOLIC PANEL
BUN: 26 mg/dL — ABNORMAL HIGH (ref 6–23)
CO2: 28 mEq/L (ref 19–32)
Calcium: 9.9 mg/dL (ref 8.4–10.5)
Chloride: 104 mEq/L (ref 96–112)
Creatinine, Ser: 0.65 mg/dL (ref 0.50–1.35)
GFR calc Af Amer: >90 mL/min (ref >90)
GFR calc non Af Amer: >90 mL/min (ref >90)
Glucose, Bld: 189 mg/dL — ABNORMAL HIGH (ref 70–99)
Potassium: 4 mEq/L (ref 3.5–5.1)
Sodium: 144 mEq/L (ref 135–145)

## 2012-12-21 LAB — GLUCOSE, CAPILLARY
Glucose-Capillary: 133 mg/dL — ABNORMAL HIGH (ref 70–99)
Glucose-Capillary: 135 mg/dL — ABNORMAL HIGH (ref 70–99)
Glucose-Capillary: 140 mg/dL — ABNORMAL HIGH (ref 70–99)
Glucose-Capillary: 154 mg/dL — ABNORMAL HIGH (ref 70–99)
Glucose-Capillary: 155 mg/dL — ABNORMAL HIGH (ref 70–99)
Glucose-Capillary: 182 mg/dL — ABNORMAL HIGH (ref 70–99)
Glucose-Capillary: 182 mg/dL — ABNORMAL HIGH (ref 70–99)

## 2012-12-21 NOTE — Progress Notes (Signed)
6 Days Post-Op  Subjective: arousable on vent. Does not follow commands  Objective: Vital signs in last 24 hours: Temp:  [99.5 F (37.5 C)-100.6 F (38.1 C)] 100.2 F (37.9 C) (04/12 0800) Pulse Rate:  [63-109] 93 (04/12 0800) Resp:  [14-30] 30 (04/12 0800) BP: (131-206)/(61-104) 161/98 mmHg (04/12 0800) SpO2:  [98 %-100 %] 99 % (04/12 0800) Arterial Line BP: (149-206)/(58-98) 187/85 mmHg (04/12 0800) FiO2 (%):  [39.2 %-41.2 %] 40.8 % (04/12 0800) Weight:  [200 lb 2.8 oz (90.8 kg)] 200 lb 2.8 oz (90.8 kg) (04/12 0500) Last BM Date: 12/20/12  Intake/Output from previous day: 04/11 0701 - 04/12 0700 In: 1965 [NG/GT:1405; IV Piggyback:360] Out: 1728 [Urine:1720; Drains:8] Intake/Output this shift: Total I/O In: 85 [Other:20; NG/GT:65] Out: 170 [Urine:170]  GI: soft, non-tender; bowel sounds normal; no masses,  no organomegaly Neurologic: Mental status: arousable but does not follow commands. open eyes spontaneously Motor: reaches at times with arms  Lab Results:   Recent Labs  12/20/12 0550 12/21/12 0535  WBC 16.4* 17.5*  HGB 12.6* 12.2*  HCT 37.0* 35.4*  PLT 294 294   BMET  Recent Labs  12/20/12 0550 12/21/12 0535  NA 140 144  K 3.7 4.0  CL 103 104  CO2 29 28  GLUCOSE 174* 189*  BUN 26* 26*  CREATININE 0.51 0.65  CALCIUM 9.8 9.9   PT/INR No results found for this basename: LABPROT, INR,  in the last 72 hours ABG  Recent Labs  12/19/12 0423  PHART 7.469*  HCO3 26.8*    Studies/Results: Ct Head Wo Contrast  12/21/2012  *RADIOLOGY REPORT*  Clinical Data: Fall traumatic subdural hematoma.  CT HEAD WITHOUT CONTRAST  Technique:  Contiguous axial images were obtained from the base of the skull through the vertex without contrast.  Comparison: 12/18/2012  Findings: Large left parietal craniotomy defect again seen.  The left subdural drain has been removed. There has been near complete resolution of the extra-axial blood and parenchymal contusions seen  bilaterally since previous study.  No acute intracranial hemorrhage or cerebral infarct identified. Ventricles are stable in size.  No significant midline shift. Nondisplaced fracture of left zygomatic arch again noted.  IMPRESSION:  1.  Near complete resolution of extra-axial hemorrhage and bilateral cerebral hemorrhagic contusions since prior exam. 2.  No acute intracranial findings.   Original Report Authenticated By: Myles Rosenthal, M.D.    Dg Chest Port 1 View  12/20/2012  *RADIOLOGY REPORT*  Clinical Data: Respiratory failure.  PORTABLE CHEST - 1 VIEW  Comparison: 12/19/2012.  Findings: Endotracheal tube is in satisfactory position. Nasogastric tube is followed into the stomach with the tip projecting beyond the inferior margin of the image.  Left PICC tip projects over the SVC.  Heart size stable.  Lungs are low in volume with right basilar atelectasis, unchanged.  IMPRESSION: Low lung volumes with right basilar atelectasis, stable.   Original Report Authenticated By: Leanna Battles, M.D.    Dg Abd Portable 1v  12/20/2012  **ADDENDUM** CREATED: 12/20/2012 11:33:48  A second image was submitted after the original report was finalized.  The second image, taken at 1104 hours, shows a nasogastric tube terminating in the stomach.  **END ADDENDUM** SIGNED BY: Reyes Ivan, M.D.   12/20/2012  *RADIOLOGY REPORT*  Clinical Data: Orogastric tube placement.  PORTABLE ABDOMEN - 1 VIEW  Comparison: None.  Findings: A large teardrop shaped radiopaque structure projects over the left upper quadrant.  There are overlying surgical skin staples.  An enteric tube is  not readily identified.  Bowel gas pattern is otherwise unremarkable.  Two catheters terminate in the midline anatomic pelvis.  IMPRESSION:  1.  Enteric tube is not readily identified, as suggested in the clinical history. 2.  Large teardrop-shaped radiopaque structure with overlying surgical skin staples, presumably postoperative or postprocedural in  etiology.  Please correlate clinically.   Original Report Authenticated By: Leanna Battles, M.D.     Anti-infectives: Anti-infectives   Start     Dose/Rate Route Frequency Ordered Stop   12/20/12 1745  ceFEPIme (MAXIPIME) 1 g in dextrose 5 % 50 mL IVPB     1 g 100 mL/hr over 30 Minutes Intravenous 3 times per day 12/20/12 1741     12/20/12 0900  levofloxacin (LEVAQUIN) IVPB 750 mg     750 mg 100 mL/hr over 90 Minutes Intravenous Every 24 hours 12/20/12 0848     12/18/12 2100  piperacillin-tazobactam (ZOSYN) IVPB 3.375 g  Status:  Discontinued     3.375 g 12.5 mL/hr over 240 Minutes Intravenous Every 8 hours 12/18/12 1852 12/19/12 1554   12/18/12 2000  vancomycin (VANCOCIN) IVPB 1000 mg/200 mL premix  Status:  Discontinued     1,000 mg 200 mL/hr over 60 Minutes Intravenous Every 8 hours 12/18/12 1852 12/19/12 1554   12/15/12 0800  ceFAZolin (ANCEF) IVPB 1 g/50 mL premix     1 g 100 mL/hr over 30 Minutes Intravenous Every 8 hours 12/15/12 0413 12/15/12 1604      Assessment/Plan: s/p Procedure(s): CRANIECTOMY HEMATOMA EVACUATION SUBDURAL WITH PLACEMENT OF BONE FLAP IN ABDOMINAL WALL (Left) continue abx for pna Continue tube feeds for nutritional support TBI per NSU  LOS: 7 days    TOTH III,Dora Clauss S 12/21/2012

## 2012-12-21 NOTE — Progress Notes (Signed)
Subjective: Patient reports Patient is intubated on ventilator with spontaneous eye opening. Not following commands. Wife is at bedside.  Objective: Vital signs in last 24 hours: Temp:  [99.5 F (37.5 C)-100.6 F (38.1 C)] 100.2 F (37.9 C) (04/12 0800) Pulse Rate:  [63-109] 93 (04/12 0800) Resp:  [14-30] 30 (04/12 0800) BP: (131-206)/(61-104) 161/98 mmHg (04/12 0800) SpO2:  [98 %-100 %] 99 % (04/12 0800) Arterial Line BP: (149-206)/(58-98) 187/85 mmHg (04/12 0800) FiO2 (%):  [39.2 %-41.2 %] 40.8 % (04/12 0800) Weight:  [90.8 kg (200 lb 2.8 oz)] 90.8 kg (200 lb 2.8 oz) (04/12 0500)  Intake/Output from previous day: 04/11 0701 - 04/12 0700 In: 1965 [NG/GT:1405; IV Piggyback:360] Out: 1728 [Urine:1720; Drains:8] Intake/Output this shift: Total I/O In: 85 [Other:20; NG/GT:65] Out: 170 [Urine:170]  Left-sided craniotomy incision clean and dry and intact. Pupils 3 mm and reactive.  Lab Results:  Recent Labs  12/20/12 0550 12/21/12 0535  WBC 16.4* 17.5*  HGB 12.6* 12.2*  HCT 37.0* 35.4*  PLT 294 294   BMET  Recent Labs  12/20/12 0550 12/21/12 0535  NA 140 144  K 3.7 4.0  CL 103 104  CO2 29 28  GLUCOSE 174* 189*  BUN 26* 26*  CREATININE 0.51 0.65  CALCIUM 9.8 9.9    Studies/Results: Ct Head Wo Contrast  12/21/2012  *RADIOLOGY REPORT*  Clinical Data: Fall traumatic subdural hematoma.  CT HEAD WITHOUT CONTRAST  Technique:  Contiguous axial images were obtained from the base of the skull through the vertex without contrast.  Comparison: 12/18/2012  Findings: Large left parietal craniotomy defect again seen.  The left subdural drain has been removed. There has been near complete resolution of the extra-axial blood and parenchymal contusions seen bilaterally since previous study.  No acute intracranial hemorrhage or cerebral infarct identified. Ventricles are stable in size.  No significant midline shift. Nondisplaced fracture of left zygomatic arch again noted.   IMPRESSION:  1.  Near complete resolution of extra-axial hemorrhage and bilateral cerebral hemorrhagic contusions since prior exam. 2.  No acute intracranial findings.   Original Report Authenticated By: Myles Rosenthal, M.D.    Dg Chest Port 1 View  12/20/2012  *RADIOLOGY REPORT*  Clinical Data: Respiratory failure.  PORTABLE CHEST - 1 VIEW  Comparison: 12/19/2012.  Findings: Endotracheal tube is in satisfactory position. Nasogastric tube is followed into the stomach with the tip projecting beyond the inferior margin of the image.  Left PICC tip projects over the SVC.  Heart size stable.  Lungs are low in volume with right basilar atelectasis, unchanged.  IMPRESSION: Low lung volumes with right basilar atelectasis, stable.   Original Report Authenticated By: Leanna Battles, M.D.    Dg Abd Portable 1v  12/20/2012  **ADDENDUM** CREATED: 12/20/2012 11:33:48  A second image was submitted after the original report was finalized.  The second image, taken at 1104 hours, shows a nasogastric tube terminating in the stomach.  **END ADDENDUM** SIGNED BY: Reyes Ivan, M.D.   12/20/2012  *RADIOLOGY REPORT*  Clinical Data: Orogastric tube placement.  PORTABLE ABDOMEN - 1 VIEW  Comparison: None.  Findings: A large teardrop shaped radiopaque structure projects over the left upper quadrant.  There are overlying surgical skin staples.  An enteric tube is not readily identified.  Bowel gas pattern is otherwise unremarkable.  Two catheters terminate in the midline anatomic pelvis.  IMPRESSION:  1.  Enteric tube is not readily identified, as suggested in the clinical history. 2.  Large teardrop-shaped radiopaque structure with overlying  surgical skin staples, presumably postoperative or postprocedural in etiology.  Please correlate clinically.   Original Report Authenticated By: Leanna Battles, M.D.     Assessment/Plan: Stable status post craniectomy for decompression. CT scan shows stability also from yesterday.  LOS: 7  days  Continue supportive care and ventilatory weaning as tolerated. Issue of tracheotomy for long-term airway management to be decided by Dr. Jeral Fruit and trauma team.   Stefani Dama 12/21/2012, 10:00 AM

## 2012-12-22 ENCOUNTER — Inpatient Hospital Stay (HOSPITAL_COMMUNITY): Payer: BC Managed Care – PPO

## 2012-12-22 LAB — GLUCOSE, CAPILLARY
Glucose-Capillary: 105 mg/dL — ABNORMAL HIGH (ref 70–99)
Glucose-Capillary: 113 mg/dL — ABNORMAL HIGH (ref 70–99)
Glucose-Capillary: 133 mg/dL — ABNORMAL HIGH (ref 70–99)
Glucose-Capillary: 151 mg/dL — ABNORMAL HIGH (ref 70–99)
Glucose-Capillary: 170 mg/dL — ABNORMAL HIGH (ref 70–99)
Glucose-Capillary: 175 mg/dL — ABNORMAL HIGH (ref 70–99)

## 2012-12-22 LAB — CULTURE, BLOOD (ROUTINE X 2)
Culture: NO GROWTH
Culture: NO GROWTH

## 2012-12-22 MED ORDER — LEVETIRACETAM 100 MG/ML PO SOLN
500.0000 mg | Freq: Two times a day (BID) | ORAL | Status: DC
  Administered 2012-12-22 (×2): 500 mg
  Filled 2012-12-22 (×4): qty 5

## 2012-12-22 MED ORDER — SODIUM CHLORIDE 0.9 % IV SOLN
25.0000 ug/h | INTRAVENOUS | Status: DC
  Administered 2012-12-22: 25 ug/h via INTRAVENOUS
  Administered 2012-12-28 – 2012-12-29 (×2): 50 ug/h via INTRAVENOUS
  Administered 2012-12-30: 100 ug/h via INTRAVENOUS
  Administered 2012-12-31 (×2): 300 ug/h via INTRAVENOUS
  Administered 2012-12-31: 100 ug/h via INTRAVENOUS
  Administered 2012-12-31: 200 ug/h via INTRAVENOUS
  Administered 2013-01-01: 100 ug/h via INTRAVENOUS
  Filled 2012-12-22 (×10): qty 50

## 2012-12-22 NOTE — Progress Notes (Signed)
Subjective: Patient reports Arousable seems more agitated today. Not following commands semi-purposeful with movements.  Objective: Vital signs in last 24 hours: Temp:  [99.6 F (37.6 C)-101.5 F (38.6 C)] 99.6 F (37.6 C) (04/13 0800) Pulse Rate:  [75-130] 81 (04/13 0800) Resp:  [15-37] 15 (04/13 0800) BP: (130-199)/(63-120) 173/83 mmHg (04/13 0800) SpO2:  [95 %-100 %] 100 % (04/13 0800) Arterial Line BP: (159-229)/(66-100) 166/73 mmHg (04/13 0800) FiO2 (%):  [39 %-42.4 %] 41.1 % (04/13 0800)  Intake/Output from previous day: 04/12 0701 - 04/13 0700 In: 3190 [NG/GT:2110; IV Piggyback:610] Out: 2225 [Urine:2225] Intake/Output this shift: Total I/O In: 215 [NG/GT:65; IV Piggyback:150] Out: -   Incisions clean and dry staples are intact in abdomen and also over scalp.  Lab Results:  Recent Labs  12/20/12 0550 12/21/12 0535  WBC 16.4* 17.5*  HGB 12.6* 12.2*  HCT 37.0* 35.4*  PLT 294 294   BMET  Recent Labs  12/20/12 0550 12/21/12 0535  NA 140 144  K 3.7 4.0  CL 103 104  CO2 29 28  GLUCOSE 174* 189*  BUN 26* 26*  CREATININE 0.51 0.65  CALCIUM 9.8 9.9    Studies/Results: Ct Head Wo Contrast  12/21/2012  *RADIOLOGY REPORT*  Clinical Data: Fall traumatic subdural hematoma.  CT HEAD WITHOUT CONTRAST  Technique:  Contiguous axial images were obtained from the base of the skull through the vertex without contrast.  Comparison: 12/18/2012  Findings: Large left parietal craniotomy defect again seen.  The left subdural drain has been removed. There has been near complete resolution of the extra-axial blood and parenchymal contusions seen bilaterally since previous study.  No acute intracranial hemorrhage or cerebral infarct identified. Ventricles are stable in size.  No significant midline shift. Nondisplaced fracture of left zygomatic arch again noted.  IMPRESSION:  1.  Near complete resolution of extra-axial hemorrhage and bilateral cerebral hemorrhagic contusions since  prior exam. 2.  No acute intracranial findings.   Original Report Authenticated By: Myles Rosenthal, M.D.    Dg Abd Portable 1v  12/20/2012  **ADDENDUM** CREATED: 12/20/2012 11:33:48  A second image was submitted after the original report was finalized.  The second image, taken at 1104 hours, shows a nasogastric tube terminating in the stomach.  **END ADDENDUM** SIGNED BY: Reyes Ivan, M.D.   12/20/2012  *RADIOLOGY REPORT*  Clinical Data: Orogastric tube placement.  PORTABLE ABDOMEN - 1 VIEW  Comparison: None.  Findings: A large teardrop shaped radiopaque structure projects over the left upper quadrant.  There are overlying surgical skin staples.  An enteric tube is not readily identified.  Bowel gas pattern is otherwise unremarkable.  Two catheters terminate in the midline anatomic pelvis.  IMPRESSION:  1.  Enteric tube is not readily identified, as suggested in the clinical history. 2.  Large teardrop-shaped radiopaque structure with overlying surgical skin staples, presumably postoperative or postprocedural in etiology.  Please correlate clinically.   Original Report Authenticated By: Leanna Battles, M.D.     Assessment/Plan: Continue supportive care. Stable  LOS: 8 days  Remove staples from abdomen and scalp 12 days postop   Marcellous Snarski J 12/22/2012, 9:33 AM

## 2012-12-22 NOTE — Progress Notes (Signed)
7 Days Post-Op  Subjective: Pt stable with no issues overnight.  Pt tolerating TFs.    Objective: Vital signs in last 24 hours: Temp:  [99.6 F (37.6 C)-101.5 F (38.6 C)] 99.6 F (37.6 C) (04/13 0800) Pulse Rate:  [75-130] 81 (04/13 0800) Resp:  [15-37] 15 (04/13 0800) BP: (130-199)/(63-120) 173/83 mmHg (04/13 0800) SpO2:  [95 %-100 %] 100 % (04/13 0800) Arterial Line BP: (141-229)/(66-100) 166/73 mmHg (04/13 0800) FiO2 (%):  [39 %-42.4 %] 41.1 % (04/13 0800) Last BM Date: 12/20/12  Intake/Output from previous day: 04/12 0701 - 04/13 0700 In: 3190 [NG/GT:2110; IV Piggyback:610] Out: 2225 [Urine:2225] Intake/Output this shift: Total I/O In: 215 [NG/GT:65; IV Piggyback:150] Out: -   General appearance: alert GI: soft, nd, nt, active BS Neurologic: Mental status: alertness: alert, doesn't follow commands  Lab Results:   Recent Labs  12/20/12 0550 12/21/12 0535  WBC 16.4* 17.5*  HGB 12.6* 12.2*  HCT 37.0* 35.4*  PLT 294 294   BMET  Recent Labs  12/20/12 0550 12/21/12 0535  NA 140 144  K 3.7 4.0  CL 103 104  CO2 29 28  GLUCOSE 174* 189*  BUN 26* 26*  CREATININE 0.51 0.65  CALCIUM 9.8 9.9   PT/INR No results found for this basename: LABPROT, INR,  in the last 72 hours ABG No results found for this basename: PHART, PCO2, PO2, HCO3,  in the last 72 hours  Studies/Results: Ct Head Wo Contrast  12/21/2012  *RADIOLOGY REPORT*  Clinical Data: Fall traumatic subdural hematoma.  CT HEAD WITHOUT CONTRAST  Technique:  Contiguous axial images were obtained from the base of the skull through the vertex without contrast.  Comparison: 12/18/2012  Findings: Large left parietal craniotomy defect again seen.  The left subdural drain has been removed. There has been near complete resolution of the extra-axial blood and parenchymal contusions seen bilaterally since previous study.  No acute intracranial hemorrhage or cerebral infarct identified. Ventricles are stable in size.   No significant midline shift. Nondisplaced fracture of left zygomatic arch again noted.  IMPRESSION:  1.  Near complete resolution of extra-axial hemorrhage and bilateral cerebral hemorrhagic contusions since prior exam. 2.  No acute intracranial findings.   Original Report Authenticated By: Myles Rosenthal, M.D.    Dg Abd Portable 1v  12/20/2012  **ADDENDUM** CREATED: 12/20/2012 11:33:48  A second image was submitted after the original report was finalized.  The second image, taken at 1104 hours, shows a nasogastric tube terminating in the stomach.  **END ADDENDUM** SIGNED BY: Reyes Ivan, M.D.   12/20/2012  *RADIOLOGY REPORT*  Clinical Data: Orogastric tube placement.  PORTABLE ABDOMEN - 1 VIEW  Comparison: None.  Findings: A large teardrop shaped radiopaque structure projects over the left upper quadrant.  There are overlying surgical skin staples.  An enteric tube is not readily identified.  Bowel gas pattern is otherwise unremarkable.  Two catheters terminate in the midline anatomic pelvis.  IMPRESSION:  1.  Enteric tube is not readily identified, as suggested in the clinical history. 2.  Large teardrop-shaped radiopaque structure with overlying surgical skin staples, presumably postoperative or postprocedural in etiology.  Please correlate clinically.   Original Report Authenticated By: Leanna Battles, M.D.     Anti-infectives: Anti-infectives   Start     Dose/Rate Route Frequency Ordered Stop   12/20/12 1745  ceFEPIme (MAXIPIME) 1 g in dextrose 5 % 50 mL IVPB     1 g 100 mL/hr over 30 Minutes Intravenous 3 times per day  12/20/12 1741     12/20/12 0900  levofloxacin (LEVAQUIN) IVPB 750 mg     750 mg 100 mL/hr over 90 Minutes Intravenous Every 24 hours 12/20/12 0848     12/18/12 2100  piperacillin-tazobactam (ZOSYN) IVPB 3.375 g  Status:  Discontinued     3.375 g 12.5 mL/hr over 240 Minutes Intravenous Every 8 hours 12/18/12 1852 12/19/12 1554   12/18/12 2000  vancomycin (VANCOCIN) IVPB 1000  mg/200 mL premix  Status:  Discontinued     1,000 mg 200 mL/hr over 60 Minutes Intravenous Every 8 hours 12/18/12 1852 12/19/12 1554   12/15/12 0800  ceFAZolin (ANCEF) IVPB 1 g/50 mL premix     1 g 100 mL/hr over 30 Minutes Intravenous Every 8 hours 12/15/12 0413 12/15/12 1604      Assessment/Plan: s/p Procedure(s): CRANIECTOMY HEMATOMA EVACUATION SUBDURAL WITH PLACEMENT OF BONE FLAP IN ABDOMINAL WALL (Left) Con't TFs Con't to wean vent as tol May need Trach later this week as per Dr. Lindie Spruce Con't abx for PNA  LOS: 8 days    Marigene Ehlers., [facility] 12/22/2012

## 2012-12-23 DIAGNOSIS — I609 Nontraumatic subarachnoid hemorrhage, unspecified: Secondary | ICD-10-CM

## 2012-12-23 DIAGNOSIS — S065X9A Traumatic subdural hemorrhage with loss of consciousness of unspecified duration, initial encounter: Secondary | ICD-10-CM

## 2012-12-23 LAB — GLUCOSE, CAPILLARY
Glucose-Capillary: 111 mg/dL — ABNORMAL HIGH (ref 70–99)
Glucose-Capillary: 119 mg/dL — ABNORMAL HIGH (ref 70–99)
Glucose-Capillary: 128 mg/dL — ABNORMAL HIGH (ref 70–99)
Glucose-Capillary: 142 mg/dL — ABNORMAL HIGH (ref 70–99)
Glucose-Capillary: 163 mg/dL — ABNORMAL HIGH (ref 70–99)
Glucose-Capillary: 168 mg/dL — ABNORMAL HIGH (ref 70–99)

## 2012-12-23 MED ORDER — SODIUM CHLORIDE 0.9 % IV SOLN
500.0000 mg | Freq: Two times a day (BID) | INTRAVENOUS | Status: DC
  Administered 2012-12-23 – 2012-12-26 (×7): 500 mg via INTRAVENOUS
  Filled 2012-12-23 (×10): qty 5

## 2012-12-23 MED ORDER — PROPRANOLOL HCL 20 MG/5ML PO SOLN
20.0000 mg | Freq: Three times a day (TID) | ORAL | Status: DC | PRN
  Filled 2012-12-23: qty 5

## 2012-12-23 MED ORDER — METOPROLOL TARTRATE 1 MG/ML IV SOLN
5.0000 mg | Freq: Four times a day (QID) | INTRAVENOUS | Status: DC
  Administered 2012-12-23 – 2012-12-25 (×9): 5 mg via INTRAVENOUS
  Filled 2012-12-23 (×14): qty 5

## 2012-12-23 MED ORDER — IPRATROPIUM-ALBUTEROL 20-100 MCG/ACT IN AERS
2.0000 | INHALATION_SPRAY | Freq: Four times a day (QID) | RESPIRATORY_TRACT | Status: DC | PRN
  Filled 2012-12-23: qty 4

## 2012-12-23 NOTE — Procedures (Signed)
Extubation Procedure Note  Patient Details:   Name: Albert [name] DOB: 1976/06/19 MRN: [id number]   Airway Documentation:  Airway 7.5 mm (Active)  Secured at (cm) 23 cm 12/23/2012  7:23 AM  Measured From Lips 12/23/2012  7:23 AM  Secured Location Left 12/23/2012  7:23 AM  Secured By [employer] 12/23/2012  7:23 AM  Tube Holder Repositioned Yes 12/23/2012  7:23 AM  Cuff Pressure (cm H2O) 24 cm H2O 12/23/2012  7:23 AM  Site Condition Dry 12/23/2012  3:33 AM    Evaluation  O2 sats: stable throughout Complications: No apparent complications Patient did tolerate procedure well. Bilateral Breath Sounds: Clear;Diminished Suctioning: Oral;Airway No, pt still unable to follow commands  Ok Anis, MA 12/23/2012, 8:24 AM

## 2012-12-23 NOTE — Progress Notes (Signed)
ANTIBIOTIC CONSULT NOTE - INITIAL  Pharmacy Consult for levaquin Indication: PNA  Allergies  Allergen Reactions  . Vancomycin Rash  . Zosyn (Piperacillin Sod-Tazobactam So) Rash    Patient Measurements: Height: 5\' 11"  (180.3 cm) Weight: 200 lb 6.4 oz (90.9 kg) IBW/kg (Calculated) : 75.3   Vital Signs: Temp: 99.7 F (37.6 C) (04/14 0800) Temp src: Rectal (04/14 0800) BP: 140/82 mmHg (04/14 0900) Pulse Rate: 107 (04/14 0900) Intake/Output from previous day: 04/13 0701 - 04/14 0700 In: 3504.5 [I.V.:54.5; NG/GT:2710; IV Piggyback:300] Out: 2225 [Urine:2225] Intake/Output from this shift: Total I/O In: 0.6 [I.V.:0.6] Out: -   Labs:  Recent Labs  12/21/12 0535  WBC 17.5*  HGB 12.2*  PLT 294  CREATININE 0.65   Estimated Creatinine Clearance: 148.6 ml/min (by C-G formula based on Cr of 0.65). No results found for this basename: VANCOTROUGH, Leodis Binet, VANCORANDOM, GENTTROUGH, GENTPEAK, GENTRANDOM, TOBRATROUGH, TOBRAPEAK, TOBRARND, AMIKACINPEAK, AMIKACINTROU, AMIKACIN,  in the last 72 hours   Microbiology: Recent Results (from the past 720 hour(s))  CULTURE, BLOOD (ROUTINE X 2)     Status: None   Collection Time    12/16/12 10:50 AM      Result Value Range Status   Specimen Description BLOOD LEFT HAND   Final   Special Requests BOTTLES DRAWN AEROBIC ONLY 3CC   Final   Culture  Setup Time 12/16/2012 14:21   Final   Culture NO GROWTH 5 DAYS   Final   Report Status 12/22/2012 FINAL   Final  CULTURE, BLOOD (ROUTINE X 2)     Status: None   Collection Time    12/16/12 10:58 AM      Result Value Range Status   Specimen Description BLOOD RIGHT ARM   Final   Special Requests BOTTLES DRAWN AEROBIC AND ANAEROBIC 10CC   Final   Culture  Setup Time 12/16/2012 14:21   Final   Culture NO GROWTH 5 DAYS   Final   Report Status 12/22/2012 FINAL   Final  URINE CULTURE     Status: None   Collection Time    12/16/12 12:03 PM      Result Value Range Status   Specimen Description  URINE, CATHETERIZED   Final   Special Requests Normal   Final   Culture  Setup Time 12/16/2012 12:46   Final   Colony Count NO GROWTH   Final   Culture NO GROWTH   Final   Report Status 12/17/2012 FINAL   Final  CULTURE, RESPIRATORY (NON-EXPECTORATED)     Status: None   Collection Time    12/16/12  4:07 PM      Result Value Range Status   Specimen Description TRACHEAL ASPIRATE   Final   Special Requests NONE   Final   Gram Stain     Final   Value: ABUNDANT WBC PRESENT, PREDOMINANTLY PMN     RARE SQUAMOUS EPITHELIAL CELLS PRESENT     MODERATE GRAM NEGATIVE COCCI     MODERATE GRAM POSITIVE COCCI     IN PAIRS   Culture     Final   Value: ABUNDANT STREPTOCOCCUS PNEUMONIAE     ABUNDANT HAEMOPHILUS INFLUENZAE     Note: Beta Lactamase Positive     ABUNDANT MORAXELLA CATARRHALIS(BRANHAMELLA)     Note: BETA LACTAMASE POSITIVE   Report Status 12/20/2012 FINAL   Final   Organism ID, Bacteria STREPTOCOCCUS PNEUMONIAE   Final    Medical History: Past Medical History  Diagnosis Date  . Headache  Assessment: Patient is a 37 y.o M s/p craniectomy on on cefepime and levaquin day 3 for H.Flu, Strep pnuemo and Moraxella PNA.     Plan:  Continue Cefepime 1gram iv Q8hrs and Levaquin 750mg  IV q24hrs. F/u LOT.  Wendie Simmer, PharmD, BCPS Clinical Pharmacist  Pager: [phone]

## 2012-12-23 NOTE — Progress Notes (Signed)
Speech Language Pathology Treatment Patient Details Name: Albert [name] MRN: [id number] DOB: 1976-09-05 Today's Date: 12/23/2012 Time: 1016-1100 SLP Time Calculation (min): 44 min  Assessment / Plan / Recommendation Clinical Impression  SLP provided skilled opportunities for increase response today including use of visual, auditory, and tactile stimulation. Patient sat edge of bed with PT/OT present. Now extubated and breathing well on own. Patient presented today with generalized responses characterized by opening of eyes with verbal and tactile stimuli. Max [facility] assist provided for following of 1-step directions for completion of basic, familiar ADLs. Eyes noted to be deviated to the left with complete right sided inattention. Attempted to have patient track familiar picture. No tracking noted, pupils reactive however patient without blink to clinician provided threat. When repositioned supine, patient with increased HR, RR, restlessness, and posturing of left upper extremity into flexion; ? neuro storming. RN aware. Continues to present in a Rancho Level II (generalized response) with emerging behaviors of a Rancho Level III (localized response). ? component of anoxia given above presentation and in light of cardiac arrest. SLP will continue to f/u.      SLP Plan  Continue with current plan of care    Pertinent Vitals/Pain See note above  SLP Goals  SLP Goals Potential to Achieve Goals: Good Progress/Goals/Alternative treatment plan discussed with pt/caregiver and they: Agree SLP Goal #1: Pt will follow one step commands with max verbal/tactile cues x3.  SLP Goal #1 - Progress: Progressing toward goal SLP Goal #2: Pt will sustain attention to basic functional task for 30 seconds with max contextual cues.  SLP Goal #2 - Progress: Progressing toward goal SLP Goal #3: Pt will verbalize at word level (following extubation ) with max verbal/contextual cues.  SLP Goal #3 - Progress:  Progressing toward goal  General Respiratory Status: Supplemental O2 delivered via (comment) (nasal cannula)  Oral Cavity - Oral Hygiene Patient is mechanically ventilated, follow VAP prevention protocol for oral care: Oral care provided every 4 hours   Treatment Treatment focused on: Coma recovery;Cognition Skilled Treatment: SLP provided skilled opportunities for increase response today including use of visual, auditory, and tactile stimulation. Patient sat edge of bed with PT/OT present. Now extubated and breathing well on own. Patient presented today with generalized responses characterized by opening of eyes with verbal and tactile stimuli. Max [facility] assist provided for following of 1-step directions for completion of basic, familiar ADLs. Eyes noted to be deviated to the left with complete right sided inattention. Attempted to have patient track familiar picture. No tracking noted, pupils reactive however patient without blink to clinician provided threat. When repositioned supine, patient with increased HR, RR, restlessness, and posturing of left upper extremity into flexion; ? neuro storming. RN aware. Continues to present in a Rancho Level II (generalized response) with emerging behaviors of a Rancho Level III (localized response). ? component of anoxia given above presentation and in light of cardiac arrest. SLP will continue to f/u.     GO   [name] MA, CCC-SLP [phone]   Albert [name] 12/23/2012, 1:27 PM

## 2012-12-23 NOTE — Progress Notes (Signed)
  At 2150, RN responded to vent alarming. ETT pulled back slightly d/t pt agitation and movement.  RTT repositioned ETT. Bilateral lung sounds clear. Chest x-ray confirmed placement.  Dr. Derrell Lolling notified. Intermittent sedation had been needed frequently d/t pt agitation. Orders now given for continuous fentanyl gtt.   Will continue to monitor.  Oletta Cohn RN

## 2012-12-23 NOTE — Progress Notes (Signed)
Pt's right radial arterial line accidentally dislodged when pt was repositioned.  Minimal bleeding noted, pressure held and dry drsg applied.  MD Lindie Spruce) notified with no new orders given.

## 2012-12-23 NOTE — Progress Notes (Signed)
Chaplain visited with pt's parents as they were leaving 3100. Parents very pleased that pt is more responsive now that tubes have been removed. Pt's dad asked me to go to bedside and offer prayer for continued healing. I did so.

## 2012-12-23 NOTE — Progress Notes (Signed)
Patient ID: Albert [name], male   DOB: Jul 06, 1976, 37 y.o.   MRN: [id number] Seen by me this am. Extubated. Wound healing well. To get oob with pt help. i will get a mri brain this week. Spoke with wife. Rehabilitation medicine to see him.

## 2012-12-23 NOTE — Progress Notes (Signed)
Physical Therapy Treatment Patient Details Name: Isaid Salvia MRN: [id number] DOB: 08-11-1976 Today's Date: 12/23/2012 Time: 4098-1191 PT Time Calculation (min): 43 min  PT Assessment / Plan / Recommendation Comments on Treatment Session  pt presents with Fall resulting in L Fronto-temporo-parietal SDH s/p L Crani with bone flap in L UQ.  pt remains at a Rancho II with generalized responses.  Attempted to use pictures of daughter to direct gaze and attend on object.  At end of session when pt returned to supine, pt began to posture UEs in tight flexion, HR increased to 130's, RR in 30's, pt restless and postured towards L side.  ? Is pt Neuro Storming?  Spoke with RN at end of session about this.  Will continue to follow.      Follow Up Recommendations  CIR     Does the patient have the potential to tolerate intense rehabilitation     Barriers to Discharge        Equipment Recommendations   (TBD)    Recommendations for Other Services Rehab consult  Frequency Min 3X/week   Plan Discharge plan remains appropriate;Frequency remains appropriate    Precautions / Restrictions Precautions Precautions: Fall;Other (comment) Precaution Comments: pt with L Crani with drain and bone flap in L UQ.   Restrictions Weight Bearing Restrictions: No   Pertinent Vitals/Pain Does not indicate pain.  Pt extubated this am with sats remaining upper 90's on 2L O2 throughout session.      Mobility  Bed Mobility Bed Mobility: Supine to Sit;Sitting - Scoot to [place] of Bed;Sit to Supine Supine to Sit: 1: +2 Total assist;HOB elevated Supine to Sit: Patient Percentage: 0% Sitting - Scoot to Edge of Bed: 1: +2 Total assist Sitting - Scoot to Edge of Bed: Patient Percentage: 0% Sit to Supine: 1: +2 Total assist;HOB flat Sit to Supine: Patient Percentage: 0% Details for Bed Mobility Assistance: pt lethargic in supine, with increased arousal noted in sitting.  All cognition tasks performed in sitting.    Transfers Transfers: Not assessed Ambulation/Gait Ambulation/Gait Assistance: Not tested (comment) Stairs: No Wheelchair Mobility Wheelchair Mobility: No    Exercises     PT Diagnosis:    PT Problem List:   PT Treatment Interventions:     PT Goals Acute Rehab PT Goals Time For Goal Achievement: 01/02/13 Potential to Achieve Goals: Good PT Goal: Supine/Side to Sit - Progress: Progressing toward goal PT Goal: Sit at Edge Of Bed - Progress: Progressing toward goal PT Goal: Sit to Supine/Side - Progress: Progressing toward goal Additional Goals PT Goal: Additional Goal #1 - Progress: Progressing toward goal PT Goal: Additional Goal #2 - Progress: Progressing toward goal  Visit Information  Last PT Received On: 12/23/12 Assistance Needed: +2 PT/OT Co-Evaluation/Treatment: Yes    Subjective Data      Cognition  Cognition Overall Cognitive Status: Impaired Area of Impairment: Attention;Following commands;Rancho level Arousal/Alertness: Lethargic Behavior During Session: Lethargic Current Attention Level: Other (comment) (pt opens eyes to some verbal and some tactile stimuli) Cognition - Other Comments: pt resting upon arrival with spontaneous eye opening and L directed gaze preference, but can move gaze towards midline, but only moves eyes horizontally.  pt briefly seems to look at wife's face and another time at therapist, but question actually visually attending to object vs object simply being in direction pt was gazing.  Attempted participation in familiar tasks of washing face and oral care, however only active movement noted was pt turning head side to side briefly  after therapist introduced oral swab into pt's mouth.   Rancho Levels of Cognitive Functioning Rancho [place] Amigos Scales of Cognitive Functioning: Generalized response    Balance  Balance Balance Assessed: Yes Static Sitting Balance Static Sitting - Balance Support: Feet unsupported;Left upper extremity  supported Static Sitting - Level of Assistance: 1: +2 Total assist Static Sitting - Comment/# of Minutes: pt performed ~ 0-10% of balance task while sitting EOB.  Occasionally pt able to actively lift head and turn towards L side, but unable to maintain.    End of Session PT - End of Session Equipment Utilized During Treatment: Oxygen Activity Tolerance: Patient limited by fatigue Patient left: in bed;with call bell/phone within reach;with family/visitor present Nurse Communication: Mobility status   GP     Sunny Schlein,  [phone] 12/23/2012, 12:32 PM
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
## 2012-12-23 NOTE — Progress Notes (Signed)
Occupational Therapy Treatment Patient Details Name: Albert [name] MRN: [id number] DOB: July 26, 1976 Today's Date: 12/23/2012 Time: 0865-7846 OT Time Calculation (min): 55 min  OT Assessment / Plan / Recommendation Comments on Treatment Session 37 s/p fall (ETOH) related with lt fronto-temporo- parietal SDH with craniotomy (bone flap lt abdomen) Pt remains rancho II (generalized response) . Journal now present in room for documenting after TBI session. Wife educated on quiet environment for brain healing. Note left in notebook to help facilitate the least distractable environment possible. Next session attempt weight bearing with bed elevated  for incr arousal.    Follow Up Recommendations  CIR    Barriers to Discharge       Equipment Recommendations  Other (comment)    Recommendations for Other Services Rehab consult  Frequency Min 3X/week   Plan Discharge plan remains appropriate    Precautions / Restrictions Precautions Precautions: Fall Precaution Comments: pt with L Crani with drain and bone flap in L UQ.   Restrictions Weight Bearing Restrictions: No   Pertinent Vitals/Pain No pain noted Neuro storming with return to supine Spoke with Trauma MD regarding observed decorticate posturing    ADL  Eating/Feeding: NPO Grooming: +1 Total assistance;Wash/dry face (hand over hand no initiation) Where Assessed - Grooming: Supported sitting Lower Body Dressing: +1 Total assistance Where Assessed - Lower Body Dressing: Supine, head of bed up Transfers/Ambulation Related to ADLs: N/A ADL Comments: Wife present on arrival. Family supplied a journal for TBI and guest to document in. Pt tolerated EOB sitting during session with extubation this AM. Pt not following commands. Pt returned to supine and demonstrates neuro-storming. Pt with bil UE cross in decorticate posturing with HR 130s. Pt maintained posture for > 2 minutes.     OT Diagnosis:    OT Problem List:   OT Treatment  Interventions:     OT Goals Acute Rehab OT Goals OT Goal Formulation: With family Time For Goal Achievement: 01/02/13 Potential to Achieve Goals: Good ADL Goals Pt Will Perform Grooming: with max assist;Supported;Sitting, edge of bed;with cueing (comment type and amount) ADL Goal: Grooming - Progress: Not progressing Miscellaneous OT Goals Miscellaneous OT Goal #1: Pt will follow 1 step simple command 2 out 5 trialls OT Goal: Miscellaneous Goal #1 - Progress: Not progressing Miscellaneous OT Goal #2: Pt will demonstrate localized response 3 out 4 trials OT Goal: Miscellaneous Goal #2 - Progress: Not progressing Miscellaneous OT Goal #3: Pt will tolerate EOB sitting ~5 minutes with max (A) OT Goal: Miscellaneous Goal #3 - Progress: Not progressing  Visit Information  Last OT Received On: 12/23/12 Assistance Needed: +2 PT/OT Co-Evaluation/Treatment: Yes    Subjective Data      Prior Functioning       Cognition  Cognition Overall Cognitive Status: Impaired Area of Impairment: Attention;Following commands;Rancho level Arousal/Alertness: Lethargic Behavior During Session: Lethargic Current Attention Level: Other (comment);Focused (open eyes to name call not following commands) Cognition - Other Comments: Pt with spontaneous eye opening, does not blink to threat, Lt eye deviation and neck rotation. Pt demonstrated ability to shift eyes to midline during session and noted to have Lt eye decr ocular ROM horizontal compared to Rt eye. Eyes are not aligned. pt presented with familia objects  (tooth brush wash cloth, picture of daughter and wife placing hair under patients nose). Pt wife washed hair just before arrival because she states patient loves the smell of her hair after showering. PT with no response for any stimuli placed at this time. Pt  total (A) at EOB . Pt presented with oral swab in mouth and pt shifting head righ t to left  briefly but patient demostrates neck rotation right  to left prior to tooth brush .  Rancho Levels of Cognitive Functioning [place] Amigos Scales of Cognitive Functioning: Generalized response    Mobility  Bed Mobility Bed Mobility: Supine to Sit;Sitting - Scoot to [place] of Bed;Sit to Supine Supine to Sit: 1: +2 Total assist;HOB elevated Supine to Sit: Patient Percentage: 0% Sitting - Scoot to Edge of Bed: 1: +2 Total assist Sitting - Scoot to Edge of Bed: Patient Percentage: 0% Sit to Supine: 1: +2 Total assist;HOB flat Sit to Supine: Patient Percentage: 0% Details for Bed Mobility Assistance: Pt with spontaneous eye openings without cues, incr arousal with EOB sitting Transfers Transfers: Not assessed    Exercises      Balance Balance Balance Assessed: Yes Static Sitting Balance Static Sitting - Balance Support: Feet unsupported;Left upper extremity supported Static Sitting - Level of Assistance: 1: +2 Total assist Static Sitting - Comment/# of Minutes: Pt able to initiate neck extension but unable to sustain.    End of Session OT - End of Session Activity Tolerance: Patient tolerated treatment well Patient left: in bed;with call bell/phone within reach;with family/visitor present Nurse Communication: Mobility status;Precautions  GO     Lucile Shutters 12/23/2012, 2:54 PM Pager: [phone]

## 2012-12-24 ENCOUNTER — Inpatient Hospital Stay (HOSPITAL_COMMUNITY): Payer: BC Managed Care – PPO

## 2012-12-24 LAB — BASIC METABOLIC PANEL
BUN: 32 mg/dL — ABNORMAL HIGH (ref 6–23)
CO2: 27 mEq/L (ref 19–32)
Calcium: 10.2 mg/dL (ref 8.4–10.5)
Chloride: 108 mEq/L (ref 96–112)
Creatinine, Ser: 0.71 mg/dL (ref 0.50–1.35)
GFR calc Af Amer: >90 mL/min (ref >90)
GFR calc non Af Amer: >90 mL/min (ref >90)
Glucose, Bld: 119 mg/dL — ABNORMAL HIGH (ref 70–99)
Potassium: 4.2 mEq/L (ref 3.5–5.1)
Sodium: 148 mEq/L — ABNORMAL HIGH (ref 135–145)

## 2012-12-24 LAB — GLUCOSE, CAPILLARY
Glucose-Capillary: 107 mg/dL — ABNORMAL HIGH (ref 70–99)
Glucose-Capillary: 111 mg/dL — ABNORMAL HIGH (ref 70–99)
Glucose-Capillary: 114 mg/dL — ABNORMAL HIGH (ref 70–99)
Glucose-Capillary: 122 mg/dL — ABNORMAL HIGH (ref 70–99)
Glucose-Capillary: 123 mg/dL — ABNORMAL HIGH (ref 70–99)
Glucose-Capillary: 125 mg/dL — ABNORMAL HIGH (ref 70–99)
Glucose-Capillary: 133 mg/dL — ABNORMAL HIGH (ref 70–99)
Glucose-Capillary: 146 mg/dL — ABNORMAL HIGH (ref 70–99)

## 2012-12-24 LAB — CBC
HCT: 38.1 % — ABNORMAL LOW (ref 39.0–52.0)
Hemoglobin: 12.6 g/dL — ABNORMAL LOW (ref 13.0–17.0)
MCH: 28.4 pg (ref 26.0–34.0)
MCHC: 33.1 g/dL (ref 30.0–36.0)
MCV: 86 fL (ref 78.0–100.0)
Platelets: 391 10*3/uL (ref 150–400)
RBC: 4.43 MIL/uL (ref 4.22–5.81)
RDW: 13.4 % (ref 11.5–15.5)
WBC: 13.2 10*3/uL — ABNORMAL HIGH (ref 4.0–10.5)

## 2012-12-24 LAB — LEVETIRACETAM LEVEL: Levetiracetam Lvl: 11.3 ug/mL (ref 5.0–30.0)

## 2012-12-24 MED ORDER — SODIUM CHLORIDE 0.9 % IV SOLN
INTRAVENOUS | Status: DC | PRN
  Administered 2012-12-24: via INTRAVENOUS

## 2012-12-24 MED ORDER — PROPRANOLOL HCL 20 MG/5ML PO SOLN
20.0000 mg | Freq: Three times a day (TID) | ORAL | Status: DC
  Administered 2012-12-24 – 2012-12-25 (×4): 20 mg
  Filled 2012-12-24 (×7): qty 5

## 2012-12-24 MED ORDER — LORAZEPAM 2 MG/ML IJ SOLN
0.5000 mg | INTRAMUSCULAR | Status: DC | PRN
  Administered 2012-12-27 – 2012-12-28 (×2): 1 mg via INTRAVENOUS
  Administered 2012-12-28: 0.5 mg via INTRAVENOUS
  Administered 2012-12-28: 1 mg via INTRAVENOUS
  Administered 2012-12-28: 0.5 mg via INTRAVENOUS
  Administered 2012-12-31 – 2013-01-03 (×8): 1 mg via INTRAVENOUS
  Filled 2012-12-24 (×15): qty 1

## 2012-12-24 MED ORDER — PIVOT 1.5 CAL PO LIQD
1000.0000 mL | ORAL | Status: DC
  Administered 2012-12-24 – 2012-12-27 (×3): 1000 mL
  Filled 2012-12-24 (×9): qty 1000

## 2012-12-24 MED ORDER — GADOBENATE DIMEGLUMINE 529 MG/ML IV SOLN
20.0000 mL | Freq: Once | INTRAVENOUS | Status: AC
  Administered 2012-12-24: 18 mL via INTRAVENOUS

## 2012-12-24 MED ORDER — FREE WATER
200.0000 mL | Freq: Three times a day (TID) | Status: DC
  Administered 2012-12-24 – 2012-12-25 (×3): 200 mL

## 2012-12-24 NOTE — Progress Notes (Signed)
Patient ID: Albert [name], male   DOB: 1976/09/03, 37 y.o.   MRN: [id number] Stable, no respiratory distress. Able to cough. Not ff/c. Mri done. Spoke with wife and i will let her know the results. Seen by rehabilitation medicine

## 2012-12-24 NOTE — Progress Notes (Signed)
NUTRITION FOLLOW UP  Intervention:   Increase Pivot 1.5 to new goal rate of 70 ml/hr  TF regimen will provide: 2520 kcal (>100% of needs) and 157 grams protein ( >100% of minimum needs), and 1275 ml H2O. Total free water will be 1875 ml.  Pt may need additional free water as pt is on concentrated TF formula. Recommend 300 ml every 6 hours.   Nutrition Dx:   Inadequate oral intake related to inability to eat as evidenced by NPO status; ongoing.  Goal:   Pt to meet >/= 90% of their estimated nutrition needs; met  Monitor:   TF tolerance, weight, labs  Assessment:   Pt admitted after fall, positive for ETOH. Has SDH and SAH, multiple facial fractures, s/p decompressive craniectomy of L SDH. Pt intubated for airway protection. Pt with questionable cardiac arrest with unknown down time.  Pt discussed during ICU rounds and with RN.  Pt extubated 4/14. Unable to pass swallow eval at this time, feeding tube to be replaced and TF resumed.  Free water flushes: 200 ml every 8 hours provides: 600 ml  Height: Ht Readings from Last 1 Encounters:  12/15/12 5\' 11"  (1.803 m)    Weight Status:   Wt Readings from Last 1 Encounters:  12/24/12 191 lb 2.2 oz (86.7 kg)  Admission weight 216 lb Usual weight: 203-204 lb  Re-estimated needs:  Kcal: 1610-9604 Protein: 139-157 Fluid: > 2.3 L/day  Skin: incisions  Diet Order: NPO   Intake/Output Summary (Last 24 hours) at 12/24/12 1051 Last data filed at 12/24/12 0600  Gross per 24 hour  Intake    360 ml  Output   2135 ml  Net  -1775 ml    Last BM: 4/11   Labs:   Recent Labs Lab 12/18/12 0500 12/19/12 0420 12/20/12 0550 12/21/12 0535 12/24/12 0540  NA 144 141 140 144 148*  K 3.6 3.6 3.7 4.0 4.2  CL 106 103 103 104 108  CO2 29 29 29 28 27   BUN 16 23 26* 26* 32*  CREATININE 0.61 0.56 0.51 0.65 0.71  CALCIUM 9.1 9.6 9.8 9.9 10.2  MG 2.5 2.6*  --   --   --   PHOS 2.4 3.3  --   --   --   GLUCOSE 167* 176* 174* 189* 119*     CBG (last 3)   Recent Labs  12/23/12 1946 12/24/12 0010 12/24/12 0415  GLUCAP 111* 111* 125*    Scheduled Meds: . antiseptic oral rinse  15 mL Mouth Rinse QID  . ceFEPime (MAXIPIME) IV  1 g Intravenous Q8H  . chlorhexidine  15 mL Mouth Rinse BID  . free water  200 mL Per Tube Q8H  . insulin aspart  0-15 Units Subcutaneous Q4H  . levETIRAcetam  500 mg Intravenous BID  . levofloxacin (LEVAQUIN) IV  750 mg Intravenous Q24H  . metoprolol  5 mg Intravenous Q6H  . multivitamin  5 mL Oral Daily  . pantoprazole (PROTONIX) IV  40 mg Intravenous QHS  . propranolol  20 mg Per Tube Q8H  . sodium chloride  10-40 mL Intracatheter Q12H    Continuous Infusions: . feeding supplement (PIVOT 1.5 CAL) 1,000 mL (12/22/12 2023)  . fentaNYL infusion INTRAVENOUS Stopped (12/23/12 0715)    Kendell Bane RD, LDN, CNSC [phone] Pager [phone] After Hours Pager

## 2012-12-24 NOTE — Progress Notes (Signed)
Pt. transported to MRI at this time.  RN accompanying pt.

## 2012-12-24 NOTE — Progress Notes (Signed)
Physical Therapy Treatment Patient Details Name: Cayde Held MRN: [id number] DOB: 15-Feb-1976 Today's Date: 12/24/2012 Time: 4098-1191 PT Time Calculation (min): 35 min  PT Assessment / Plan / Recommendation Comments on Treatment Session  pt presents with Fall (ETOH related) resulting in L Fronto-temporo-parietal SDH post Crani with bone flap in L UQ.  At this point pt remains Rancho level II with generalized response, however in sitting EOB pt localized to pain response in Bil UEs.  Again after mobility, pt began Neuro storming with bil UE flexion, increased BP, increased RR, with eyes wide open.  After sitting still in recliner for ~3-4 mins pt began to relax.      Follow Up Recommendations  CIR     Does the patient have the potential to tolerate intense rehabilitation     Barriers to Discharge        Equipment Recommendations   (TBD)    Recommendations for Other Services Rehab consult  Frequency Min 3X/week   Plan Discharge plan remains appropriate;Frequency remains appropriate    Precautions / Restrictions Precautions Precautions: Fall (Simultaneous filing. User may not have seen previous data.) Precaution Comments: pt with L Crani with drain and bone flap in L UQ.   (12-24-12 Panda placed Simultaneous filing. User may not have seen previous data.) Restrictions Weight Bearing Restrictions: No   Pertinent Vitals/Pain Does not indicate pain.      Mobility  Bed Mobility Bed Mobility: Supine to Sit;Sitting - Scoot to Edge of Bed Supine to Sit: 1: +2 Total assist;HOB elevated Supine to Sit: Patient Percentage: 0% Sitting - Scoot to Edge of Bed: 1: +2 Total assist Sitting - Scoot to Edge of Bed: Patient Percentage: 0% Details for Bed Mobility Assistance: pt with increased eye opening during mobility, cueing for participation, however pt not following any cues.   Transfers Transfers: Sit to Stand;Stand to Sit Sit to Stand: 1: +2 Total assist;From bed;From elevated  surface Sit to Stand: Patient Percentage: 0% Stand to Sit: 1: +2 Total assist;To bed Stand to Sit: Patient Percentage: 0% Transfer via Lift Equipment: Maxisky Details for Transfer Assistance: Attempted semi stand to allow WBing through LEs and potentially increase arousal.  Utilized pad under hips, blocking Bil knees.  Attempted semi stand x2 from elevated bed.  From sitting EOB position, utilized Maxisky to lift pt to recliner.   Ambulation/Gait Ambulation/Gait Assistance: Not tested (comment) Stairs: No Wheelchair Mobility Wheelchair Mobility: No    Exercises     PT Diagnosis:    PT Problem List:   PT Treatment Interventions:     PT Goals Acute Rehab PT Goals Time For Goal Achievement: 01/02/13 Potential to Achieve Goals: Good PT Goal: Supine/Side to Sit - Progress: Progressing toward goal PT Goal: Sit at Edge Of Bed - Progress: Progressing toward goal PT Goal: Sit to Stand - Progress: Progressing toward goal PT Goal: Stand to Sit - Progress: Progressing toward goal PT Transfer Goal: Bed to Chair/Chair to Bed - Progress: Progressing toward goal Additional Goals PT Goal: Additional Goal #1 - Progress: Progressing toward goal PT Goal: Additional Goal #2 - Progress: Progressing toward goal  Visit Information  Last PT Received On: 12/24/12 Assistance Needed: +2 (Simultaneous filing. User may not have seen previous data.) PT/OT Co-Evaluation/Treatment: Yes    Subjective Data      Cognition  Cognition Overall Cognitive Status: Impaired Area of Impairment: Attention;Following commands;Rancho level Arousal/Alertness: Lethargic Behavior During Session: Lethargic Current Attention Level: Other (comment);Focused (open eyes to name call not following commands)  Cognition - Other Comments: Pt with spontaneous eye opening, does not blink to threat, Lt eye deviation and neck rotation. Pt demonstrated ability to shift eyes to midline during session and noted to have Lt eye decr ocular  ROM horizontal compared to Rt eye. Eyes are not aligned.  Attempted to have pt attend to picture of daughter, however unable to visually sttend.  pt with localized response to pain stimuli in Bil UEs today.   Rancho Levels of Cognitive Functioning Rancho Los Amigos Scales of Cognitive Functioning: Generalized response    Balance  Balance Balance Assessed: Yes Static Sitting Balance Static Sitting - Balance Support: Feet unsupported;Left upper extremity supported Static Sitting - Level of Assistance: 1: +2 Total assist Static Sitting - Comment/# of Minutes: Attempted head/neck movements and encouraging eye opening, however pt keeping neck in flexion more today.    End of Session PT - End of Session Equipment Utilized During Treatment: Oxygen Activity Tolerance: Patient limited by fatigue Patient left: in chair;with call bell/phone within reach Nurse Communication: Mobility status   GP     Sunny Schlein, [place] [phone] 12/24/2012, 12:29 PM

## 2012-12-24 NOTE — Progress Notes (Signed)
Patient ID: [name], male   DOB: 02/05/1976, 37 y.o.   MRN: [id number] I met with his wife at the bedside and updated her regarding his condition and the plan of care.  I answered her questions. Violeta Gelinas, MD, MPH, FACS Pager: [phone]

## 2012-12-24 NOTE — Progress Notes (Signed)
MRI scheduled for today in about 30 min.  Paged Dr. Jeral Fruit about 3 remaining staples in pt's head.  Dr. Jeral Fruit said OK to take them out.  Pt. Stable at this time.  Will continue to monitor.

## 2012-12-24 NOTE — Progress Notes (Signed)
Occupational Therapy Treatment Patient Details Name: Albert [name] MRN: [id number] DOB: 15-Feb-1976 Today's Date: 12/24/2012 Time: 4098-1191 OT Time Calculation (min): 46 min  OT Assessment / Plan / Recommendation Comments on Treatment Session 37 s/p fall (ETOH) related with lt fronto-temporo- parietal SDH with craniotomy (bone flap lt abdomen) Pt remains rancho II (generalized response) Pt with panda in place at this time. Next session - focus on adl in a seated position. Pt demonstrates neuro storming this session ~30-35 seconds    Follow Up Recommendations  CIR    Barriers to Discharge       Equipment Recommendations       Recommendations for Other Services Rehab consult  Frequency Min 3X/week   Plan Discharge plan remains appropriate    Precautions / Restrictions Precautions Precautions: Fall (Simultaneous filing. User may not have seen previous data.) Precaution Comments: pt with L Crani with drain and bone flap in L UQ.   (12-24-12 Panda placed Simultaneous filing. User may not have seen previous data.) Restrictions Weight Bearing Restrictions: No   Pertinent Vitals/Pain Withdrawal to pain bil UE  Pt pulling Rt hand back and making a soft sound    ADL  Eating/Feeding: NPO Transfers/Ambulation Related to ADLs: attempted with bed elevated sit<>Stand total +2 pt 0% at this time. Pt with pad used at hips for hip extension x 3 sit<>Stand. Pt did not demonstrate incr arousal or open eyes with standing attempt.  ADL Comments: Pt supine on arrival opening eyes spontaneously with name call and without. Pt demonstrates eyes to midline dysconjugate and mainly deviated to the left side. Pt supine <>Sit EOB. Pt tolerating EOB well. Pt with sit<>Stand attempts. Pt placed in hoyer pad and lifted using max sky to recliner and positioned. Upon positioning in chair pt demonstrates neurostorming with incr BP 147/101. Pt allowed quiet environment and removed hoyer pad from around BIL LEs. Pt  with decr neuro storming and BP decr with static supported sitting. Pt with two towels positioned behind pillow to facilitate a neutral head position and chair positioned so pt must look toward midline to see visitors. OT left note in journal. Wife arriving at the end of session. Wife with questions 1. Will the MRI show brain function and what are they looking for? 2. Is what he is doing normal for someone like him? 3. They said Neurostorming what is that?  Ot educated that the MRI - they are checking the surgerical site and that is a more detailed question to be answered by a doctor but yes they are checking his brain. 2. Albert [name] is doing what we expect from him in a Rancho II and that we can not directly compare him to another patient. That we all heal differently. 3. WIfe educated that neurostorming is disorganization in the brains ability to produce a movement demonstrated with incr BP, incr HR, Diaphoretic, incr fever, incr RR or change in oxygen but does not have to be all of the above. 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  OT spoke with Dr Janee Morn regarding wifes concerns , how wife was educated and concerns for educating on anoxic component of healing. Pt remains Rancho II with emerging III. Pt pulling hand away from painful stimulus today and focused attention visually 2 times during session with name call.     OT Diagnosis:    OT Problem List:   OT Treatment Interventions:     OT Goals Acute Rehab OT Goals  OT Goal Formulation: With family Time For Goal Achievement: 01/02/13 Potential to Achieve Goals: Good ADL Goals Pt Will Perform Grooming: with max assist;Supported;Sitting, edge of bed;with cueing (comment type and amount) ADL Goal: Grooming - Progress: Not progressing Miscellaneous OT Goals Miscellaneous OT Goal #1: Pt will follow 1 step simple command 2 out 5 trialls OT Goal: Miscellaneous Goal #1 - Progress: Not  progressing Miscellaneous OT Goal #2: Pt will demonstrate localized response 3 out 4 trials OT Goal: Miscellaneous Goal #2 - Progress: Progressing toward goals Miscellaneous OT Goal #3: Pt will tolerate EOB sitting ~5 minutes with max (A) OT Goal: Miscellaneous Goal #3 - Progress: Progressing toward goals  Visit Information  Last OT Received On: 12/24/12 Assistance Needed: +2 (Simultaneous filing. User may not have seen previous data.) PT/OT Co-Evaluation/Treatment: Yes    Subjective Data      Prior Functioning       Cognition  Cognition Overall Cognitive Status: Impaired Area of Impairment: Attention;Following commands;Rancho level Arousal/Alertness: Lethargic Behavior During Session: Lethargic Current Attention Level: Other (comment);Focused (open eyes to name call not following commands) Cognition - Other Comments: Pt with spontaneous eye opening, does not blink to threat, Lt eye deviation and neck rotation. Pt demonstrated ability to shift eyes to midline during session and noted to have Lt eye decr ocular ROM horizontal compared to Rt eye. Eyes are not aligned.  Attempted to have pt attend to picture of daughter, however unable to visually sttend.  pt with localized response to pain stimuli in Bil UEs today.   Rancho Levels of Cognitive Functioning Rancho Los Amigos Scales of Cognitive Functioning: Generalized response    Mobility  Bed Mobility Bed Mobility: Supine to Sit;Sitting - Scoot to Edge of Bed Supine to Sit: 1: +2 Total assist;HOB elevated Supine to Sit: Patient Percentage: 0% Sitting - Scoot to Edge of Bed: 1: +2 Total assist Sitting - Scoot to Edge of Bed: Patient Percentage: 0% Details for Bed Mobility Assistance: pt with increased eye opening during mobility, cueing for participation, however pt not following any cues.   Transfers Sit to Stand: 1: +2 Total assist;From bed;From elevated surface Sit to Stand: Patient Percentage: 0% Stand to Sit: 1: +2 Total  assist;To bed Stand to Sit: Patient Percentage: 0% Transfer via Lift Equipment: Maxisky Details for Transfer Assistance: Attempted semi stand to allow WBing through LEs and potentially increase arousal.  Utilized pad under hips, blocking Bil knees.  Attempted semi stand x2 from elevated bed.  From sitting EOB position, utilized Maxisky to lift pt to recliner.      Exercises  Other Exercises Other Exercises: Pt presented with picture of Daughter Albert [name] and does not track photo.    Balance Balance Balance Assessed: Yes Static Sitting Balance Static Sitting - Balance Support: Feet unsupported;Left upper extremity supported Static Sitting - Level of Assistance: 1: +2 Total assist Static Sitting - Comment/# of Minutes: Attempted head/neck movements and encouraging eye opening, however pt keeping neck in flexion more today.     End of Session OT - End of Session Activity Tolerance: Patient tolerated treatment well Patient left: in chair;with call bell/phone within reach;with family/visitor present Nurse Communication: Mobility status;Need for lift equipment;Precautions  GO     Lucile Shutters 12/24/2012, 1:21 PM Pager: [phone]

## 2012-12-24 NOTE — Progress Notes (Signed)
Patient ID: Albert [name], male   DOB: Jul 28, 1976, 37 y.o.   MRN: [id number] 9 Days Post-Op  Subjective: No issues overnight  Objective: Vital signs in last 24 hours: Temp:  [99.7 F (37.6 C)-101.5 F (38.6 C)] 100.2 F (37.9 C) (04/15 0600) Pulse Rate:  [74-110] 91 (04/15 0700) Resp:  [16-38] 38 (04/15 0700) BP: (128-181)/(71-106) 141/86 mmHg (04/15 0700) SpO2:  [94 %-99 %] 95 % (04/15 0700) Arterial Line BP: (168-251)/(78-120) 184/87 mmHg (04/14 2000) Weight:  [86.7 kg (191 lb 2.2 oz)] 86.7 kg (191 lb 2.2 oz) (04/15 0500) Last BM Date: 12/20/12  Intake/Output from previous day: 04/14 0701 - 04/15 0700 In: 510.6 [I.V.:0.6; IV Piggyback:510] Out: 2290 [Urine:2290] Intake/Output this shift:    General appearance: no distress Resp: clear to auscultation bilaterally Cardio: regular rate and rhythm GI: soft, NT, few BS, ND, bone flap L abdomen, incision CDI Extremities: no edema Neuro: eyes open spontaneous, PERL. L gaze preference, looks at voice, does not F/C, spont purposeful MVT LUE, WD Pain LLE,no MVT RLE, decort-type posture RUE to noxious   Lab Results: CBC   Recent Labs  12/24/12 0540  WBC 13.2*  HGB 12.6*  HCT 38.1*  PLT 391   BMET  Recent Labs  12/24/12 0540  NA 148*  K 4.2  CL 108  CO2 27  GLUCOSE 119*  BUN 32*  CREATININE 0.71  CALCIUM 10.2   PT/INR No results found for this basename: LABPROT, INR,  in the last 72 hours ABG No results found for this basename: PHART, PCO2, PO2, HCO3,  in the last 72 hours  Studies/Results: Dg Chest Port 1 View  12/22/2012  *RADIOLOGY REPORT*  Clinical Data: Repositioned endotracheal tube.  PORTABLE CHEST - 1 VIEW  Comparison: Chest radiograph performed 12/20/2012  Findings: The patient's endotracheal tube is seen ending 4 cm above the carina.  The lungs are hypoexpanded.  Mild vascular crowding and vascular congestion are seen.  Minimal right basilar atelectasis is noted. No pleural effusion or pneumothorax  is seen.  The left PICC is noted ending about the cavoatrial junction.  The cardiomediastinal silhouette is normal in size.  No acute osseous abnormalities are identified.  IMPRESSION:  1.  Endotracheal tube seen ending 4 cm above the carina. 2.  Lungs hypoexpanded; mild vascular congestion noted.  Minimal right basilar atelectasis seen.  Lungs otherwise grossly clear.   Original Report Authenticated By: Tonia Ghent, M.D.     Anti-infectives: Anti-infectives   Start     Dose/Rate Route Frequency Ordered Stop   12/20/12 1745  ceFEPIme (MAXIPIME) 1 g in dextrose 5 % 50 mL IVPB     1 g 100 mL/hr over 30 Minutes Intravenous 3 times per day 12/20/12 1741     12/20/12 0900  levofloxacin (LEVAQUIN) IVPB 750 mg     750 mg 100 mL/hr over 90 Minutes Intravenous Every 24 hours 12/20/12 0848     12/18/12 2100  piperacillin-tazobactam (ZOSYN) IVPB 3.375 g  Status:  Discontinued     3.375 g 12.5 mL/hr over 240 Minutes Intravenous Every 8 hours 12/18/12 1852 12/19/12 1554   12/18/12 2000  vancomycin (VANCOCIN) IVPB 1000 mg/200 mL premix  Status:  Discontinued     1,000 mg 200 mL/hr over 60 Minutes Intravenous Every 8 hours 12/18/12 1852 12/19/12 1554   12/15/12 0800  ceFAZolin (ANCEF) IVPB 1 g/50 mL premix     1 g 100 mL/hr over 30 Minutes Intravenous Every 8 hours 12/15/12 0413 12/15/12 1604  Assessment/Plan: s/p Procedure(s): CRANIECTOMY HEMATOMA EVACUATION SUBDURAL WITH PLACEMENT OF BONE FLAP IN ABDOMINAL WALL Fall TBI s/p decompressive craniectomy -- S/P decompressive craniectomy, Dr. Jeral Fruit following, arouses and localizes, some purposeful spontaneous MVT, TBI team therapies, propranolol for storming Facial abrasions -- Local care Resp -- stable S/P extubation, good cough ID -- maxipime/levaquin for H. Flu, Strep pneumo, and moraxella PNA FEN -- placed Panda per protocol, once placement confirmed will resume free H2O and start TF VTE -- SCD's Dispo -- continue ICU  LOS: 10 days     Violeta Gelinas, MD, MPH, FACS Pager: [phone]  12/24/2012

## 2012-12-24 NOTE — Clinical Social Work Note (Signed)
Clinical Social Worker continuing to follow patient and family for support and to assist with discharge planning needs.  No family present at bedside while CSW in room.  Per inpatient rehab admissions coordinator, patient requiring lower level BI program, however she is unable to provide alternative options.  CSW to follow up for more information and discuss further with patient family regarding options.  CSW remains available for support and to facilitate patient discharge needs.  Macario Golds, [place] [phone]

## 2012-12-24 NOTE — Progress Notes (Addendum)
Observed therapy session with patient. Pt requiring +2 total assist and continues at [facility] Level II. Agree with Dr Wynn Banker that pt is not a candidate for CIR at this time. Informed pt's CSW of Dr Wynn Banker' recommendation for alternative BI program. Will continue to monitor pt for progress.  For questions, call [phone].

## 2012-12-24 NOTE — Progress Notes (Signed)
Speech Language Pathology Treatment Patient Details Name: Albert [name] MRN: [id number] DOB: 1976-08-26 Today's Date: 12/24/2012 Time: 0940-1005 SLP Time Calculation (min): 25 min  Assessment / Plan / Recommendation Clinical Impression  SLP provided skilled opportunities for increased response today including use of visual, auditory, and tactile stimulation to facilitate cognitive recovery. Patient presented with some increase in localized responses today characterized by almost immediate eye opening to auditory stimuli (being called by name) as well as brief eye contact with speaker who was positioned on patients left side. Patient sat edge of bed with PT/OT present.  Albert [name] assist provided for following of 1-step directions for completion of basic, familiar ADLs. Eyes continue to be deviated to the left with notable right sided inattention. One brief episode of posturing of left upper extremity into flexion prior to sitting edge of bed; ? neuro storming. Discussed with MD who is in agreement and is planning to start meds for further control.  Continues to present in a Rancho Level II (generalized response) with increasing behaviors of a Rancho Level III (localized response). Highly suspect component of anoxia given above presentation and in light of cardiac arrest. SLP will continue to f/u.      SLP Plan  Continue with current plan of care    Pertinent Vitals/Pain None reported  SLP Goals  SLP Goals Potential to Achieve Goals: Good Progress/Goals/Alternative treatment plan discussed with pt/caregiver and they: No caregivers available SLP Goal #1: Pt will follow one step commands with Albert verbal/tactile cues x3.  SLP Goal #1 - Progress: Progressing toward goal SLP Goal #2: Pt will sustain attention to basic functional task for 30 seconds with Albert contextual cues.  SLP Goal #2 - Progress: Progressing toward goal SLP Goal #3: Pt will verbalize at word level (following extubation ) with  Albert verbal/contextual cues.  SLP Goal #3 - Progress: Progressing toward goal     Oral Cavity - Oral Hygiene Does patient have any of the following "at risk" factors?: Oxygen therapy - cannula, mask, simple oxygen devices Patient is AT RISK - Oral Care Protocol followed (see row info): Yes Patient is mechanically ventilated, follow VAP prevention protocol for oral care: Oral care provided every 4 hours   Treatment Treatment focused on: Cognition;Coma recovery Skilled Treatment: SLP provided skilled opportunities for increased response today including use of visual, auditory, and tactile stimulation to facilitate cognitive recovery. Patient presented with some increase in localized responses today characterized by almost immediate eye opening to auditory stimuli (being called by name) as well as brief eye contact with speaker who was positioned on patients left side. Patient sat edge of bed with PT/OT present.  Albert Albert [name] assist provided for following of 1-step directions for completion of basic, familiar ADLs. Eyes continue to be deviated to the left with notable right sided inattention. One brief episode of posturing of left upper extremity into flexion prior to sitting edge of bed; ? neuro storming. Discussed with MD who is in agreement and is planning to start meds for further control.  Continues to present in a Rancho Level II (generalized response) with increasing behaviors of a Rancho Level III (localized response). Highly suspect component of anoxia given above presentation and in light of cardiac arrest. SLP will continue to f/u.     Albert   [name] MA, CCC-SLP [phone]   Albert [name] 12/24/2012, 11:08 AM

## 2012-12-24 NOTE — Progress Notes (Signed)
[name], parents, and two friends were bedside during my visit. Pt was sitting up but non-verbal. Pt's family asked for me to pray for pt. During prayer, pt held my hand and at one point gripped it and moved. Pt was otherwise non-verbal. Pt's family was appreciative of prayer and visit. Marjory Lies Chaplain

## 2012-12-25 LAB — CBC
HCT: 39.9 % (ref 39.0–52.0)
Hemoglobin: 13.3 g/dL (ref 13.0–17.0)
MCH: 29 pg (ref 26.0–34.0)
MCHC: 33.3 g/dL (ref 30.0–36.0)
MCV: 86.9 fL (ref 78.0–100.0)
Platelets: 404 10*3/uL — ABNORMAL HIGH (ref 150–400)
RBC: 4.59 MIL/uL (ref 4.22–5.81)
RDW: 13.2 % (ref 11.5–15.5)
WBC: 13.2 10*3/uL — ABNORMAL HIGH (ref 4.0–10.5)

## 2012-12-25 LAB — GLUCOSE, CAPILLARY
Glucose-Capillary: 163 mg/dL — ABNORMAL HIGH (ref 70–99)
Glucose-Capillary: 156 mg/dL — ABNORMAL HIGH (ref 70–99)
Glucose-Capillary: 125 mg/dL — ABNORMAL HIGH (ref 70–99)
Glucose-Capillary: 169 mg/dL — ABNORMAL HIGH (ref 70–99)

## 2012-12-25 LAB — BASIC METABOLIC PANEL
BUN: 37 mg/dL — ABNORMAL HIGH (ref 6–23)
CO2: 29 mEq/L (ref 19–32)
Calcium: 10.1 mg/dL (ref 8.4–10.5)
Chloride: 112 mEq/L (ref 96–112)
Creatinine, Ser: 0.8 mg/dL (ref 0.50–1.35)
GFR calc Af Amer: >90 mL/min (ref >90)
GFR calc non Af Amer: >90 mL/min (ref >90)
Glucose, Bld: 140 mg/dL — ABNORMAL HIGH (ref 70–99)
Potassium: 4 mEq/L (ref 3.5–5.1)
Sodium: 151 mEq/L — ABNORMAL HIGH (ref 135–145)

## 2012-12-25 MED ORDER — ALTEPLASE 2 MG IJ SOLR
2.0000 mg | Freq: Once | INTRAMUSCULAR | Status: AC
  Administered 2012-12-25: 2 mg
  Filled 2012-12-25: qty 2

## 2012-12-25 MED ORDER — PROPRANOLOL HCL 20 MG/5ML PO SOLN
40.0000 mg | Freq: Three times a day (TID) | ORAL | Status: DC
  Administered 2012-12-25 – 2013-01-02 (×21): 40 mg
  Filled 2012-12-25 (×26): qty 10

## 2012-12-25 MED ORDER — FREE WATER
250.0000 mL | Freq: Four times a day (QID) | Status: DC
  Administered 2012-12-25 – 2012-12-26 (×3): 250 mL

## 2012-12-25 MED ORDER — PANTOPRAZOLE SODIUM 40 MG PO PACK
40.0000 mg | PACK | Freq: Every day | ORAL | Status: DC
  Administered 2012-12-25 – 2012-12-27 (×3): 40 mg
  Filled 2012-12-25 (×5): qty 20

## 2012-12-25 NOTE — Progress Notes (Addendum)
Patient ID: Albert [name], male   DOB: Jul 22, 1976, 37 y.o.   MRN: [id number] 10 Days Post-Op  Subjective: No issues overnight, non-verbal  Objective: Vital signs in last 24 hours: Temp:  [99.1 F (37.3 C)-101.7 F (38.7 C)] 99.5 F (37.5 C) (04/16 0900) Pulse Rate:  [65-114] 65 (04/16 0700) Resp:  [21-36] 21 (04/16 0700) BP: (126-184)/(79-116) 126/79 mmHg (04/16 0700) SpO2:  [94 %-98 %] 97 % (04/16 0700) Last BM Date: 12/20/12  Intake/Output from previous day: 04/15 0701 - 04/16 0700 In: 1970 [I.V.:130; NG/GT:1280; IV Piggyback:460] Out: 2040 [Urine:2040] Intake/Output this shift:    General appearance: no distress Head: incision CDI Nose: nasal panda Resp: clear to auscultation bilaterally Cardio: regular rate and rhythm GI: soft, NT, +BS, skull flap L abdomen Extremities: no edema Neuro: PERL, opens eyes spontaneously, looks to voice, R neglect, not F/C but purposeful LUE, spont mvt LLE  Lab Results: CBC   Recent Labs  12/24/12 0540 12/25/12 0420  WBC 13.2* 13.2*  HGB 12.6* 13.3  HCT 38.1* 39.9  PLT 391 404*   BMET  Recent Labs  12/24/12 0540 12/25/12 0420  NA 148* 151*  K 4.2 4.0  CL 108 112  CO2 27 29  GLUCOSE 119* 140*  BUN 32* 37*  CREATININE 0.71 0.80  CALCIUM 10.2 10.1   PT/INR No results found for this basename: LABPROT, INR,  in the last 72 hours ABG No results found for this basename: PHART, PCO2, PO2, HCO3,  in the last 72 hours  Studies/Results: Mr Laqueta Jean Wo Contrast  12/24/2012  *RADIOLOGY REPORT*  Clinical Data: Subdural hematoma subarachnoid hemorrhage.  Cardiac arrest.  MRI HEAD WITHOUT AND WITH CONTRAST  Technique:  Multiplanar, multiecho pulse sequences of the brain and surrounding structures were obtained according to standard protocol without and with intravenous contrast  Contrast: 18mL MULTIHANCE GADOBENATE DIMEGLUMINE 529 MG/ML IV SOLN  Comparison: Several prior exams most recent is head CT from 12/21/2012.  Findings:  Post left craniectomy for drainage of left-sided subdural hematoma.  Postoperative complex subdural collection extends through the craniotomy site with maximal thickness of 1.4 cm. Decrease in mass effect when compared to the 12/14/2012 exam.  Very small left parietal - occipital subdural hematoma.  Very small broad-based right temporal - occipital - parietal subdural hematoma.  Significant improvement in previously noted subarachnoid blood.  Bilateral anterior temporal lobe hemorrhagic contusions.  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  Involvement of the splenium of the corpus callosum greater on the left.  Anterior cerebral arteries may have been injured at the time of midline shift from left sided subdural hematoma.  Postcontrast imaging is significantly motion degraded. Gyriform enhancement of the medial aspect of the left occipital lobe. Findings suggest a subacute infarct.  The left posterior cerebral artery may been injured at the time of herniation.  Infection felt unlikely as a cause for above described findings.  Opacification left sphenoid sinus with complex appearance which may represent inspissated material.  Partial opacification/mucosal thickening mastoid air cells greater on the left.  Major intracranial vascular structures are patent.  IMPRESSION: Post left craniectomy for drainage of left-sided subdural hematoma. Postoperative complex subdural collection extends through the craniotomy site with maximal thickness of 1.4 cm.  Decrease in mass effect when compared to the 12/14/2012 exam.  Very small left parietal - occipital subdural hematoma.  Very small broad-based right temporal - occipital - parietal subdural hematoma.  Significant improvement in previously noted subarachnoid blood.  Bilateral  anterior temporal lobe hemorrhagic contusions.  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  Involvement of the splenium  of the corpus callosum greater on the left.  Anterior cerebral arteries may have been injured at the time of midline shift.  Postcontrast imaging significantly motion degraded. Gyriform enhancement of the medial aspect of the left occipital lobe. Findings suggest a subacute infarct.  The left posterior cerebral artery may been injured at the time of herniation.  Infection felt unlikely as a cause for above described findings.  Opacification left sphenoid sinus with complex appearance which may represent inspissated material.  Partial opacification/mucosal thickening mastoid air cells greater on the left.  Images reviewed with Dr. Jeral Fruit at the time imaging.   Original Report Authenticated By: Lacy Duverney, M.D.    Dg Abd Portable 1v  12/24/2012  *RADIOLOGY REPORT*  Clinical Data: Feeding tube placement at bedside.  PORTABLE ABDOMEN - 1 VIEW 12/24/2012 0928 hours:  Comparison: Portable abdomen x-ray 12/20/2012.  Findings: Feeding tube tip projects over the fundus of the stomach. Visualized bowel gas pattern unremarkable.  Interval removal of the skin staples overlying the left upper abdomen.  Tear drop shaped structure overlying the left upper abdomen as noted on the prior examination, again presumably post-surgical.  IMPRESSION: Feeding tube tip in the fundus of the stomach.   Original Report Authenticated By: Hulan Saas, M.D.     Anti-infectives: Anti-infectives   Start     Dose/Rate Route Frequency Ordered Stop   12/20/12 1745  ceFEPIme (MAXIPIME) 1 g in dextrose 5 % 50 mL IVPB     1 g 100 mL/hr over 30 Minutes Intravenous 3 times per day 12/20/12 1741     12/20/12 0900  levofloxacin (LEVAQUIN) IVPB 750 mg     750 mg 100 mL/hr over 90 Minutes Intravenous Every 24 hours 12/20/12 0848     12/18/12 2100  piperacillin-tazobactam (ZOSYN) IVPB 3.375 g  Status:  Discontinued     3.375 g 12.5 mL/hr over 240 Minutes Intravenous Every 8 hours 12/18/12 1852 12/19/12 1554   12/18/12 2000  vancomycin  (VANCOCIN) IVPB 1000 mg/200 mL premix  Status:  Discontinued     1,000 mg 200 mL/hr over 60 Minutes Intravenous Every 8 hours 12/18/12 1852 12/19/12 1554   12/15/12 0800  ceFAZolin (ANCEF) IVPB 1 g/50 mL premix     1 g 100 mL/hr over 30 Minutes Intravenous Every 8 hours 12/15/12 0413 12/15/12 1604      Assessment/Plan: s/p Procedure(s): CRANIECTOMY HEMATOMA EVACUATION SUBDURAL WITH PLACEMENT OF BONE FLAP IN ABDOMINAL WALL 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 Jeral Fruit following, arouses and localizes, some purposeful spontaneous MVT, TBI team therapies, propranolol for storming.  I D/W Dr. Jeral Fruit - OK for floor Facial abrasions -- Local care Resp -- remains stable S/P extubation, good cough ID -- maxipime/levaquin for H. Flu, Strep pneumo, and moraxella PNA FEN -- increase free water for hypernatremia, tol TF, if rehab facility does not accept pandas may have to have NS move flap to allow PEG. VTE -- SCD's Dispo -- to 4N  LOS: 11 days    Violeta Gelinas, MD, MPH, FACS Pager: [phone]  12/25/2012

## 2012-12-25 NOTE — Progress Notes (Signed)
Patient ID: Albert [name], male   DOB: Sep 12, 1975, 37 y.o.   MRN: [id number] I DID HAVE A LONG TASLK WITH HIS WIFE, MOTHER AND FATHER. SHOWED TO THEM THE BRAIN MRI FROM YESTERDAY . HE IS SLOWLY PROGRESSING BUT IS VERY DIFFICULT TO TELL THEM HOW CLOSE TO NORMAL HE WILL BE

## 2012-12-25 NOTE — Progress Notes (Signed)
Speech Language Pathology Treatment Patient Details Name: Albert [name] MRN: [id number] DOB: Jan 28, 1976 Today's Date: 12/25/2012 Time: 4098-1191 SLP Time Calculation (min): 53 min  Assessment / Plan / Recommendation Clinical Impression  Treatment focused on coma recovery. SLP provided opportunitites for cognitive responses today via auditory, visual, and tactile cues. Patient with localized response to auditory stimuli (name being called) characterized by eye opening and ? eye contact with speaker although continues to present with no blink to threat raising suspicion for visual impairements.  Otherwise, generalized responses noted characterized by UE and left LE movementes. Continues to present in a Rancho Level II (generalized response). Noted bilateral infarcts on MRI which are likely impacting function along with potential anoxic component.     SLP Plan  Continue with current plan of care    Pertinent Vitals/Pain None noted  SLP Goals  SLP Goals Potential to Achieve Goals: Good Progress/Goals/Alternative treatment plan discussed with pt/caregiver and they: No caregivers available SLP Goal #1: Pt will follow one step commands with max verbal/tactile cues x3.  SLP Goal #1 - Progress: Progressing toward goal SLP Goal #2: Pt will sustain attention to basic functional task for 30 seconds with max contextual cues.  SLP Goal #2 - Progress: Progressing toward goal SLP Goal #3: Pt will verbalize at word level (following extubation ) with max verbal/contextual cues.  SLP Goal #3 - Progress: Progressing toward goal      Treatment Treatment focused on: Cognition;Coma recovery Skilled Treatment: Treatment focused on coma recovery. SLP provided opportunitites for cognitive responses today via auditory, visual, and tactile cues. Patient with localized response to auditory stimuli (name being called) characterized by eye opening and ? eye contact with speaker although continues to present  with no blink to threat raising suspicion for visual impairements.  Otherwise, generalized responses noted characterized by UE and left LE movementes. Continues to present in a Rancho Level II (generalized response). Noted bilateral infarcts on MRI which are likely impacting function along with potential anoxic component.    GO   Albert Lango MA, CCC-SLP [phone]   Albert [name] Albert [name] 12/25/2012, 2:58 PM

## 2012-12-25 NOTE — Progress Notes (Signed)
Physical Therapy Treatment Patient Details Name: Albert [name] MRN: [id number] DOB: 1976/08/23 Today's Date: 12/25/2012 Time: 4098-1191 PT Time Calculation (min): 45 min  PT Assessment / Plan / Recommendation Comments on Treatment Session  pt rpesents with fall (Etoh related) resulting in L Fronto-temporor-parietal SDH post Crani with bone flap in L UQ.  MRI on 4/15 showing acute Bil ACA infarcts with L > R involving Splenium of Corpus Callosum.  Also shows aubacute infarct of L PCA involving L occipital lobe.  pt continues to remain at a Rancho II, though has demonstrated some improvement on JFK from a score of 3 on 4/10 to 6 today.  Would benefit from Bil PRAFO boots.      Follow Up Recommendations  CIR     Does the patient have the potential to tolerate intense rehabilitation     Barriers to Discharge        Equipment Recommendations   (TBD)    Recommendations for Other Services Rehab consult  Frequency Min 3X/week   Plan Discharge plan remains appropriate;Frequency remains appropriate    Precautions / Restrictions Precautions Precautions: Fall Precaution Comments: pt with L Crani with drain and bone flap in L UQ.   Restrictions Weight Bearing Restrictions: No   Pertinent Vitals/Pain Does not indicate pain.      Mobility  Bed Mobility Bed Mobility: Supine to Sit;Sitting - Scoot to Edge of Bed;Sit to Supine Supine to Sit: 1: +2 Total assist;HOB elevated Supine to Sit: Patient Percentage: 0% Sitting - Scoot to Edge of Bed: 1: +2 Total assist Sitting - Scoot to Edge of Bed: Patient Percentage: 0% Sit to Supine: 1: +2 Total assist;HOB flat Sit to Supine: Patient Percentage: 0% Details for Bed Mobility Assistance: pt with increased eye opeing immediately after mobility.  pt again not following any cues for participation in bed mobility.   Transfers Transfers: Not assessed Ambulation/Gait Ambulation/Gait Assistance: Not tested (comment) Stairs: No Wheelchair  Mobility Wheelchair Mobility: No    Exercises     PT Diagnosis:    PT Problem List:   PT Treatment Interventions:     PT Goals Acute Rehab PT Goals Time For Goal Achievement: 01/02/13 Potential to Achieve Goals: Good PT Goal: Supine/Side to Sit - Progress: Progressing toward goal PT Goal: Sit at Edge Of Bed - Progress: Progressing toward goal PT Goal: Sit to Supine/Side - Progress: Progressing toward goal Additional Goals PT Goal: Additional Goal #1 - Progress: Partly met PT Goal: Additional Goal #2 - Progress: Progressing toward goal  Visit Information  Last PT Received On: 12/25/12 Assistance Needed: +2 PT/OT Co-Evaluation/Treatment: Yes    Subjective Data      Cognition  Cognition Arousal/Alertness: Lethargic Behavior During Therapy:  (Lethargic) Overall Cognitive Status: Impaired/Different from baseline Area of Impairment: Attention;Following commands;Rancho level;JFK Recovery Scale Current Attention Level:  (occasionally opens eyes to name, not consistently.  ) General Comments: pt with spontaneous eye opening with gaze directed towards pt's L side and occasionally starts gaze at midline then drifts to L.  ? a slow beating Nystagmus with pt's eyes beating R towards midline and then slow drift back to L side.  Attempted horizontal head movements with pt immediately closing eyes.  Performed JFK with pt demo'ing increased spontaneous movement and response to auditory stimuli.   JFK Coma Recovery Scale Auditory: Localization to Sound Visual: None Motor: Flexion Withdrawl Oromotor/Verbal: Oral reflexive Movement Communication: None Arousal: Oral reflexive Movement Total Score: 6 [place] Levels of Cognitive Functioning [place] [address] Scales of  Cognitive Functioning: Generalized response    Balance  Balance Balance Assessed: Yes Static Sitting Balance Static Sitting - Balance Support: Feet unsupported;Left upper extremity supported Static Sitting - Level of  Assistance: 1: +2 Total assist Static Sitting - Comment/# of Minutes: Worked on passive cervical R rotation, cervical extension, and R lateral flexion.  At times pt resists neck ROM.  Attempted pt participation in oral hygiene, however pt not participating wtih L UE or with oral movements.    End of Session PT - End of Session Equipment Utilized During Treatment: Oxygen Activity Tolerance: Patient limited by fatigue Patient left: in bed;with call bell/phone within reach Nurse Communication: Mobility status   GP     Sunny Schlein, [place] [phone] 12/25/2012, 12:03 PM

## 2012-12-25 NOTE — Progress Notes (Signed)
Occupational Therapy Treatment Patient Details Name: Albert [name] MRN: [id number] DOB: 1976/09/09 Today's Date: 12/25/2012 Time: 4098-1191 OT Time Calculation (min): 53 min  OT Assessment / Plan / Recommendation Comments on Treatment Session 37 s/p fall (ETOH) related with lt fronto-temporo- parietal SDH with craniotomy (bone flap lt abdomen) MRI reveals Lt parietal- occipital SDH, small broad- based Rt temporal- occipital-parieta, BIL anterior temporal lobe hemorrhagic contusions, Bil acute anterior cerebral artery distribution infaracts greater on lef and involvement of the splenium of the corpus callosum. Pt remains rancho II (generalized response) .     Follow Up Recommendations  CIR    Barriers to Discharge       Equipment Recommendations  Other (comment)    Recommendations for Other Services Rehab consult  Frequency Min 3X/week   Plan Discharge plan remains appropriate    Precautions / Restrictions Precautions Precautions: Fall Precaution Comments: pt with L Crani with drain and bone flap in L UQ.   Restrictions Weight Bearing Restrictions: No   Pertinent Vitals/Pain     ADL  Eating/Feeding: NPO Grooming: Wash/dry hands;Teeth care;+1 Total assistance (hand over hand) Where Assessed - Grooming: Supported sitting Transfers/Ambulation Related to ADLs: not attempted ADL Comments: Pt supine on arrival with shifting to the right side. Pt participated in [place] testing. See below. Pt opening eyes and attending to the left side. Pt responding to sound . Pt with no blink to threat and question visual input. Pt supine <>sit EOB. Pt with oral care, wifes shirt with perfume, daughter picture adn face washing presented. Pt not responding or visually attending to any idems. Pt remains in Rancho Coma II.     OT Diagnosis:    OT Problem List:   OT Treatment Interventions:     OT Goals Acute Rehab OT Goals OT Goal Formulation: With family Time For Goal Achievement:  01/02/13 Potential to Achieve Goals: Good ADL Goals Pt Will Perform Grooming: with max assist;Supported;Sitting, edge of bed;with cueing (comment type and amount) ADL Goal: Grooming - Progress: Not progressing Miscellaneous OT Goals Miscellaneous OT Goal #1: Pt will follow 1 step simple command 2 out 5 trialls OT Goal: Miscellaneous Goal #1 - Progress: Not progressing Miscellaneous OT Goal #2: Pt will demonstrate localized response 3 out 4 trials OT Goal: Miscellaneous Goal #2 - Progress: Not progressing Miscellaneous OT Goal #3: Pt will tolerate EOB sitting ~5 minutes with max (A) OT Goal: Miscellaneous Goal #3 - Progress: Progressing toward goals  Visit Information  Last OT Received On: 12/25/12 Assistance Needed: +2 PT/OT Co-Evaluation/Treatment: Yes    Subjective Data      Prior Functioning       Cognition  Cognition Arousal/Alertness: Lethargic Behavior During Therapy:  (Lethargic) Overall Cognitive Status: Impaired/Different from baseline Area of Impairment: Attention;Memory;JFK Recovery Scale Current Attention Level: Focused (most of session aroused but not focused attention) Following Commands:  (not follow commands at all) General Comments: pt with spontaneous eye opening with gaze directed towards pt's L side and occasionally starts gaze at midline then drifts to L.  ? a slow beating Nystagmus with pt's eyes beating R towards midline and then slow drift back to L side.  Attempted horizontal head movements with pt immediately closing eyes.  Performed JFK with pt demo'ing increased spontaneous movement and response to auditory stimuli.   JFK Coma Recovery Scale Auditory: Localization to Sound Visual: None Motor: Flexion Withdrawl Oromotor/Verbal: Oral reflexive Movement Communication: None Arousal: Oral reflexive Movement Total Score: 6 [place] Levels of Cognitive Functioning [place] [address]  Scales of Cognitive Functioning: Generalized response    Mobility  Bed  Mobility Bed Mobility: Supine to Sit;Sitting - Scoot to Edge of Bed;Sit to Supine Supine to Sit: 1: +2 Total assist;HOB elevated Supine to Sit: Patient Percentage: 0% Sitting - Scoot to Edge of Bed: 1: +2 Total assist Sitting - Scoot to Edge of Bed: Patient Percentage: 0% Sit to Supine: 1: +2 Total assist;HOB flat Sit to Supine: Patient Percentage: 0% Details for Bed Mobility Assistance: pt with increased eye opeing immediately after mobility.  pt again not following any cues for participation in bed mobility.      Exercises      Balance Balance Balance Assessed: Yes Static Sitting Balance Static Sitting - Balance Support: Feet unsupported;Left upper extremity supported Static Sitting - Level of Assistance: 1: +2 Total assist Static Sitting - Comment/# of Minutes: Worked on passive cervical R rotation, cervical extension, and R lateral flexion.  At times pt resists neck ROM.  Attempted pt participation in oral hygiene, however pt not participating wtih L UE or with oral movements.     End of Session OT - End of Session Activity Tolerance: Patient tolerated treatment well Patient left: in bed;with call bell/phone within reach;with family/visitor present Nurse Communication: Mobility status;Precautions;Need for lift equipment  GO     Lucile Shutters 12/25/2012, 2:30 PM Pager: [phone]

## 2012-12-25 NOTE — Progress Notes (Signed)
  Red port occluded on PICC. IV team notified. Unable to clear port. Intracatheter tPa ordered per protocol, per IV team.  Will continue to monitor. Oletta Cohn RN

## 2012-12-26 LAB — CBC
HCT: 41.5 % (ref 39.0–52.0)
Hemoglobin: 13.8 g/dL (ref 13.0–17.0)
MCH: 28.8 pg (ref 26.0–34.0)
MCHC: 33.3 g/dL (ref 30.0–36.0)
MCV: 86.6 fL (ref 78.0–100.0)
Platelets: 455 10*3/uL — ABNORMAL HIGH (ref 150–400)
RBC: 4.79 MIL/uL (ref 4.22–5.81)
RDW: 13.1 % (ref 11.5–15.5)
WBC: 14.2 10*3/uL — ABNORMAL HIGH (ref 4.0–10.5)

## 2012-12-26 LAB — BASIC METABOLIC PANEL
BUN: 34 mg/dL — ABNORMAL HIGH (ref 6–23)
CO2: 27 mEq/L (ref 19–32)
Calcium: 10 mg/dL (ref 8.4–10.5)
Chloride: 115 mEq/L — ABNORMAL HIGH (ref 96–112)
Creatinine, Ser: 0.92 mg/dL (ref 0.50–1.35)
GFR calc Af Amer: >90 mL/min (ref >90)
GFR calc non Af Amer: >90 mL/min (ref >90)
Glucose, Bld: 164 mg/dL — ABNORMAL HIGH (ref 70–99)
Potassium: 3.8 mEq/L (ref 3.5–5.1)
Sodium: 153 mEq/L — ABNORMAL HIGH (ref 135–145)

## 2012-12-26 LAB — GLUCOSE, CAPILLARY
Glucose-Capillary: 127 mg/dL — ABNORMAL HIGH (ref 70–99)
Glucose-Capillary: 129 mg/dL — ABNORMAL HIGH (ref 70–99)
Glucose-Capillary: 139 mg/dL — ABNORMAL HIGH (ref 70–99)
Glucose-Capillary: 140 mg/dL — ABNORMAL HIGH (ref 70–99)
Glucose-Capillary: 143 mg/dL — ABNORMAL HIGH (ref 70–99)

## 2012-12-26 MED ORDER — FREE WATER
300.0000 mL | Freq: Four times a day (QID) | Status: DC
  Administered 2012-12-26 – 2012-12-31 (×14): 300 mL

## 2012-12-26 MED ORDER — LEVETIRACETAM 100 MG/ML PO SOLN
500.0000 mg | Freq: Two times a day (BID) | ORAL | Status: DC
  Filled 2012-12-26: qty 5

## 2012-12-26 MED ORDER — SODIUM CHLORIDE 0.45 % IV SOLN
INTRAVENOUS | Status: DC
  Administered 2012-12-26: 09:00:00 via INTRAVENOUS
  Filled 2012-12-26 (×7): qty 1000

## 2012-12-26 MED ORDER — LEVETIRACETAM 100 MG/ML PO SOLN
500.0000 mg | Freq: Two times a day (BID) | ORAL | Status: DC
  Administered 2012-12-26 – 2012-12-27 (×3): 500 mg
  Filled 2012-12-26 (×5): qty 5

## 2012-12-26 NOTE — Progress Notes (Signed)
Orthopedic Tech Progress Note Patient Details:  Mansa Willers [facility] [place] [birth year] [id number] Bilateral Prafo boots applied to feet. Tolerated well.  Ortho Devices Type of Ortho Device: Other (comment) Ortho Device/Splint Location: Bilateral Prafo boots Ortho Device/Splint Interventions: Application   Asia R Thompson 12/26/2012, 3:29 PM

## 2012-12-26 NOTE — Progress Notes (Signed)
ANTIBIOTIC CONSULT NOTE - FOLLOW UP  Pharmacy Consult for Levaquin Indication: pneumonia  Allergies  Allergen Reactions  . Vancomycin Rash  . Zosyn (Piperacillin Sod-Tazobactam So) Rash    Patient Measurements: Height: 5\' 11"  (180.3 cm) Weight: 191 lb 2.2 oz (86.7 kg) IBW/kg (Calculated) : 75.3  Vital Signs: Temp: 101.4 F (38.6 C) (04/17 0941) Temp src: Rectal (04/17 0941) BP: 142/109 mmHg (04/17 0941) Pulse Rate: 95 (04/17 0941) Intake/Output from previous day: 04/16 0701 - 04/17 0700 In: 680 [I.V.:40; NG/GT:390; IV Piggyback:250] Out: 1100 [Urine:1100] Intake/Output from this shift:    Labs:  Recent Labs  12/24/12 0540 12/25/12 0420 12/26/12 0500  WBC 13.2* 13.2* 14.2*  HGB 12.6* 13.3 13.8  PLT 391 404* 455*  CREATININE 0.71 0.80 0.92   Estimated Creatinine Clearance: 119.4 ml/min (by C-G formula based on Cr of 0.92). No results found for this basename: VANCOTROUGH, Leodis Binet, VANCORANDOM, GENTTROUGH, GENTPEAK, GENTRANDOM, TOBRATROUGH, TOBRAPEAK, TOBRARND, AMIKACINPEAK, AMIKACINTROU, AMIKACIN,  in the last 72 hours   Microbiology: Recent Results (from the past 720 hour(s))  CULTURE, BLOOD (ROUTINE X 2)     Status: None   Collection Time    12/16/12 10:50 AM      Result Value Range Status   Specimen Description BLOOD LEFT HAND   Final   Special Requests BOTTLES DRAWN AEROBIC ONLY 3CC   Final   Culture  Setup Time 12/16/2012 14:21   Final   Culture NO GROWTH 5 DAYS   Final   Report Status 12/22/2012 FINAL   Final  CULTURE, BLOOD (ROUTINE X 2)     Status: None   Collection Time    12/16/12 10:58 AM      Result Value Range Status   Specimen Description BLOOD RIGHT ARM   Final   Special Requests BOTTLES DRAWN AEROBIC AND ANAEROBIC 10CC   Final   Culture  Setup Time 12/16/2012 14:21   Final   Culture NO GROWTH 5 DAYS   Final   Report Status 12/22/2012 FINAL   Final  URINE CULTURE     Status: None   Collection Time    12/16/12 12:03 PM      Result Value  Range Status   Specimen Description URINE, CATHETERIZED   Final   Special Requests Normal   Final   Culture  Setup Time 12/16/2012 12:46   Final   Colony Count NO GROWTH   Final   Culture NO GROWTH   Final   Report Status 12/17/2012 FINAL   Final  CULTURE, RESPIRATORY (NON-EXPECTORATED)     Status: None   Collection Time    12/16/12  4:07 PM      Result Value Range Status   Specimen Description TRACHEAL ASPIRATE   Final   Special Requests NONE   Final   Gram Stain     Final   Value: ABUNDANT WBC PRESENT, PREDOMINANTLY PMN     RARE SQUAMOUS EPITHELIAL CELLS PRESENT     MODERATE GRAM NEGATIVE COCCI     MODERATE GRAM POSITIVE COCCI     IN PAIRS   Culture     Final   Value: ABUNDANT STREPTOCOCCUS PNEUMONIAE     ABUNDANT HAEMOPHILUS INFLUENZAE     Note: Beta Lactamase Positive     ABUNDANT MORAXELLA CATARRHALIS(BRANHAMELLA)     Note: BETA LACTAMASE POSITIVE   Report Status 12/20/2012 FINAL   Final   Organism ID, Bacteria STREPTOCOCCUS PNEUMONIAE   Final    Anti-infectives   Start     Dose/Rate  Route Frequency Ordered Stop   12/20/12 1745  ceFEPIme (MAXIPIME) 1 g in dextrose 5 % 50 mL IVPB  Status:  Discontinued     1 g 100 mL/hr over 30 Minutes Intravenous 3 times per day 12/20/12 1741 12/26/12 0859   12/20/12 0900  levofloxacin (LEVAQUIN) IVPB 750 mg     750 mg 100 mL/hr over 90 Minutes Intravenous Every 24 hours 12/20/12 0848 12/30/12 0859   12/18/12 2100  piperacillin-tazobactam (ZOSYN) IVPB 3.375 g  Status:  Discontinued     3.375 g 12.5 mL/hr over 240 Minutes Intravenous Every 8 hours 12/18/12 1852 12/19/12 1554   12/18/12 2000  vancomycin (VANCOCIN) IVPB 1000 mg/200 mL premix  Status:  Discontinued     1,000 mg 200 mL/hr over 60 Minutes Intravenous Every 8 hours 12/18/12 1852 12/19/12 1554   12/15/12 0800  ceFAZolin (ANCEF) IVPB 1 g/50 mL premix     1 g 100 mL/hr over 30 Minutes Intravenous Every 8 hours 12/15/12 0413 12/15/12 1604      Assessment: 37 y/o male on  day 7 Levaquin for pneumonia with trach aspirate showing Strep pneumo, H flu, and Moraxella. Renal function is stable.  Goal of Therapy:  resolution of pneumonia  Plan:  -Continue Levaquin 750 mg IV q24h -Pharmacy signing off as stop date in place  [place], [place].D., BCPS Clinical Pharmacist Pager: [phone] 12/26/2012 2:03 PM

## 2012-12-26 NOTE — Progress Notes (Signed)
Occupational Therapy Treatment Patient Details Name: Albert [name] MRN: [id number] DOB: [birth year] Today's Date: 12/26/2012 Time: 0811-0907 OT Time Calculation (min): 56 min  OT Assessment / Plan / Recommendation Comments on Treatment Session 37 s/p fall (ETOH) related with lt fronto-temporo- parietal SDH with craniotomy (bone flap lt abdomen) Pt remains rancho II (generalized responsept presents with fall (Etoh related) resulting in L fronto-temporo-parietal SDH post Crani with bone flap in L UQ.  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  pt remains at a Rancho II level, though demonstrated increased spontaneous movements of head/neck today.     Follow Up Recommendations  CIR    Barriers to Discharge       Equipment Recommendations       Recommendations for Other Services Rehab consult  Frequency Min 3X/week   Plan Discharge plan remains appropriate    Precautions / Restrictions Precautions Precautions: Fall Precaution Comments: pt with L Crani with drain and bone flap in L UQ.     Pertinent Vitals/Pain     ADL  Eating/Feeding: NPO Grooming: Wash/dry face;Teeth care;+1 Total assistance Where Assessed - Grooming: Supported sitting Transfers/Ambulation Related to ADLs: not attempted ADL Comments: Pt with d/c condom cath on arrival. Pt log rolled Rt and Lt to provided clean linens total +2 (A). pt (A) to EOB total +2 (A) . Pt sitting EOB total (A) however this session demonstrates attempts at head control after prolonged sitting ~15 minutes. Pt extending neck then flexing neck and able to surstain neck neutral ~30-45 seconds. This is a new attempt for patient this session. Once during session, pt asked "do you want to shave?"- pt with head control shook head no. Question if direct response to questioning but clearly controlled head motion. Pt with no active response to commands during session. Attempting (oral care, face washing and presented with MIla picture)  Pt responding to painful stimulus in BIL UE and withdrawing. Pt no response with BIL LE. Pt facial grimace and moving LE with rectal themometer in side lying at end of session. Rancho II    OT Diagnosis:    OT Problem List:   OT Treatment Interventions:     OT Goals Acute Rehab OT Goals OT Goal Formulation: With family Time For Goal Achievement: 01/02/13 Potential to Achieve Goals: Good ADL Goals Pt Will Perform Grooming: with max assist;Supported;Sitting, edge of bed;with cueing (comment type and amount) ADL Goal: Grooming - Progress: Not progressing Miscellaneous OT Goals Miscellaneous OT Goal #1: Pt will follow 1 step simple command 2 out 5 trialls OT Goal: Miscellaneous Goal #1 - Progress: Not progressing Miscellaneous OT Goal #2: Pt will demonstrate localized response 3 out 4 trials OT Goal: Miscellaneous Goal #2 - Progress: Progressing toward goals Miscellaneous OT Goal #3: Pt will tolerate EOB sitting ~5 minutes with max (A) OT Goal: Miscellaneous Goal #3 - Progress: Progressing toward goals  Visit Information  Last OT Received On: 12/26/12 Assistance Needed: +2 PT/OT Co-Evaluation/Treatment: Yes    Subjective Data      Prior Functioning       Cognition  Cognition Arousal/Alertness: Lethargic Overall Cognitive Status: Impaired/Different from baseline Area of Impairment: Attention;Memory;JFK Recovery Scale Current Attention Level: Focused General Comments: pt with spontaneous eye opening with gaze directed towards pt's L side and occasionally starts gaze at midline then drifts to Lt. Pt this session noted to have a slow drift to midline and then returning to left gaze deviation. Not characterized by a nystagmus Rancho Levels of  Cognitive Functioning Rancho [employer] Scales of Cognitive Functioning: Generalized response    Mobility  Bed Mobility Supine to Sit: 1: +2 Total assist;HOB elevated Supine to Sit: Patient Percentage: 0% Sitting - Scoot to Edge of Bed: 1:  +2 Total assist Sitting - Scoot to Edge of Bed: Patient Percentage: 0% Sit to Supine: 1: +2 Total assist;HOB flat Sit to Supine: Patient Percentage: 0% Details for Bed Mobility Assistance: Pt with eyes closed majority of session. pt noted to have Lt gaze deviation. pt with pupils reactive to light. Pt demonstrates neck control for ~30-45 seconds holding at midline Transfers Transfers: Not assessed    Exercises      Balance Balance Balance Assessed: Yes Static Sitting Balance Static Sitting - Balance Support: Feet unsupported;Left upper extremity supported Static Sitting - Level of Assistance: 1: +2 Total assist Static Sitting - Comment/# of Minutes: Toal A for >20 mins sitting EOB with A to support neck at times during session.  pt lifting neck this session and weakness causing neck extension with OT behind pt to A to control.  pt once during session shook head "No" in a controlled manner after being asked "Do you want to shave your face?".  ? if pt was truly responding, however the motion was controlled and not pt's normal R to L neck rotation.     End of Session OT - End of Session Activity Tolerance: Patient tolerated treatment well Patient left: in bed;with call bell/phone within reach;with family/visitor present Nurse Communication: Mobility status;Precautions;Need for lift equipment  GO     Lucile Shutters 12/26/2012, 3:24 PM Pager: [phone]

## 2012-12-26 NOTE — Progress Notes (Signed)
Physical Therapy Treatment Patient Details Name: Albert [name] MRN: [id number] DOB: 18-Feb-1976 Today's Date: 12/26/2012 Time: 0812-0905 PT Time Calculation (min): 53 min  PT Assessment / Plan / Recommendation Comments on Treatment Session  pt presents with fall (Etoh related) resulting in L fronto-temporo-parietal SDH post Crani with bone flap in L UQ.  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  pt remains at a Rancho II level, though demonstrated increased spontaneous movements of head/neck today.  Discussed with RN about ordering PRAFO boots for pt LE positioning and pressure relief of heels.  Will continue to follow.      Follow Up Recommendations  CIR     Does the patient have the potential to tolerate intense rehabilitation     Barriers to Discharge        Equipment Recommendations   (TBD)    Recommendations for Other Services    Frequency Min 3X/week   Plan Discharge plan remains appropriate;Frequency remains appropriate    Precautions / Restrictions Precautions Precautions: Fall Precaution Comments: pt with L Crani with drain and bone flap in L UQ.     Pertinent Vitals/Pain Does not indicate pain.      Mobility  Bed Mobility Supine to Sit: 1: +2 Total assist;HOB elevated Supine to Sit: Patient Percentage: 0% Sitting - Scoot to Edge of Bed: 1: +2 Total assist Sitting - Scoot to Edge of Bed: Patient Percentage: 0% Sit to Supine: 1: +2 Total assist;HOB flat Sit to Supine: Patient Percentage: 0% Details for Bed Mobility Assistance: Pt with eyes closed majority of session. pt noted to have Lt gaze deviation. pt with pupils reactive to light. Pt demonstrates neck control for ~30-45 seconds holding at midline Transfers Transfers: Not assessed Ambulation/Gait Ambulation/Gait Assistance: Not tested (comment) Stairs: No Wheelchair Mobility Wheelchair Mobility: No    Exercises     PT Diagnosis:    PT Problem List:   PT Treatment Interventions:      PT Goals Acute Rehab PT Goals Time For Goal Achievement: 01/02/13 Potential to Achieve Goals: Good PT Goal: Supine/Side to Sit - Progress: Progressing toward goal PT Goal: Sit at Edge Of Bed - Progress: Progressing toward goal PT Goal: Sit to Supine/Side - Progress: Progressing toward goal Additional Goals PT Goal: Additional Goal #1 - Progress: Progressing toward goal PT Goal: Additional Goal #2 - Progress: Progressing toward goal  Visit Information  Last PT Received On: 12/26/12 Assistance Needed: +2 PT/OT Co-Evaluation/Treatment: Yes    Subjective Data      Cognition  Cognition Arousal/Alertness: Lethargic Overall Cognitive Status: Impaired/Different from baseline Area of Impairment: Attention;Memory;JFK Recovery Scale Current Attention Level: Focused General Comments: pt with spontaneous eye opening with gaze directed towards pt's L side and occasionally starts gaze at midline then drifts to Lt. Pt this session noted to have a slow drift to midline and then returning to left gaze deviation. Not characterized by a nystagmus Rancho Levels of Cognitive Functioning Rancho [email or web address] Scales of Cognitive Functioning: Generalized response    Balance  Balance Balance Assessed: Yes Static Sitting Balance Static Sitting - Balance Support: Feet unsupported;Left upper extremity supported Static Sitting - Level of Assistance: 1: +2 Total assist Static Sitting - Comment/# of Minutes: Toal A for >20 mins sitting EOB with A to support neck at times during session.  pt lifting neck this session and weakness causing neck extension with OT behind pt to A to control.  pt once during session shook head "No" in a controlled  manner after being asked "Do you want to shave your face?".  ? if pt was truly responding, however the motion was controlled and not pt's normal R to L neck rotation.    End of Session PT - End of Session Equipment Utilized During Treatment: Oxygen Activity Tolerance:  Patient limited by fatigue Patient left: in bed;with call bell/phone within reach Nurse Communication: Mobility status   GP     Sunny Schlein, [place]  [phone] 12/26/2012, 2:53 PM

## 2012-12-26 NOTE — Progress Notes (Addendum)
Pt has episodes of restlessness,  Occasionally lifts his head around.  Cooling blanket remains on,  Pt temp. Still running 100.4, have given tylenol via feeding tube.  Wife and parents at bedside.  Condom cath intact.  Suctioned prn, for moderate amounts of tan thickish sputumn.  Per PT suggestion pt given PRAFO boots, to help prevent foot drop.

## 2012-12-26 NOTE — Progress Notes (Signed)
Have continued to follow patient's progress. Noted that patient continues at [facility]. Pt would not be appropriate for [facility] acute inpatient rehab at this time. Noted that pt's CM working on [employer] for pt's rehabilitation. I will sign off for now. Please call if pt's status changes or for questions: [phone]

## 2012-12-26 NOTE — Progress Notes (Signed)
Spoke to patient's wife this am about inpatient rehab option in [place] at [facility].  Also discussed option of [facility] in [place] and how the insurance usually denies the transport when there is a closer facility that could provide same care.  Wife said that [facility] was the best for their family at this time. Faxed all necessary documentation to [facility] rehab at 1110am today. Explained to wife that I would notify her as soon as I have an answer from them but that with tomorrow being Good Friday, I anticipated an answer on Monday. She was understanding.

## 2012-12-26 NOTE — Progress Notes (Signed)
Speech Language Pathology Treatment Patient Details Name: Albert [name] MRN: [id number] DOB: [birth year] Today's Date: 12/26/2012 Time: 0815-0900 SLP Time Calculation (min): 45 min  Assessment / Plan / Recommendation Clinical Impression  Skilled SLP treatment focused on coma recovery with PT/OT present. SLP provided opportunities for pt responses with max verbal, tactile and visual cues. Pt with inconsistent eye opening to cues and stimuli, with head consistently turned to left with eyes deviated to left sometimes fixed, sometimes drifting to midline. During tracking exercises with meaningful stimuli there were questionable moments of focused attention, though pt does not blink to threat; visual impairement likely. When sitting edge of bed pt made purposeful movement to support his head with one instance where a y/n question was asked and pt had strong head shake, question if this was a purposeful response. Overall, pt remains in a Rancho level II (generalized response). No family present for education today.     SLP Plan  Continue with current plan of care    Pertinent Vitals/Pain NA  SLP Goals  SLP Goals SLP Goal #1: Pt will follow one step commands with max verbal/tactile cues x3.  SLP Goal #1 - Progress: Progressing toward goal SLP Goal #2: Pt will sustain attention to basic functional task for 30 seconds with max contextual cues.  SLP Goal #2 - Progress: Progressing toward goal SLP Goal #3: Pt will verbalize at word level (following extubation ) with max verbal/contextual cues.  SLP Goal #3 - Progress: Progressing toward goal  General Temperature Spikes Noted: Yes Respiratory Status: Room air Patient Positioning: Other (comment) (partially reclined and sitting edge of bed)  Oral Cavity - Oral Hygiene Patient is HIGH RISK - Oral Care Protocol followed (see row info): Yes   Treatment Treatment focused on: Cognition;Coma recovery   GO    Harlon Ditty, MA CCC-SLP  [phone]  Claudine Mouton 12/26/2012, 9:53 AM

## 2012-12-26 NOTE — Progress Notes (Signed)
Pt placed on cooling blanket. 

## 2012-12-26 NOTE — Progress Notes (Addendum)
Patient ID: [name], male   DOB: 04/17/1976, 37 y.o.   MRN: [id number] 11 Days Post-Op  Subjective: Sitting up on side of bed - assisted.  Working with TBI team. Non-verbal  Objective: Vital signs in last 24 hours: Temp:  [97.6 F (36.4 C)-102.6 F (39.2 C)] 101 F (38.3 C) (04/17 0546) Pulse Rate:  [90-115] 105 (04/17 0546) Resp:  [25-35] 28 (04/17 0546) BP: (138-160)/(83-104) 138/92 mmHg (04/17 0546) SpO2:  [92 %-98 %] 94 % (04/17 0546) Last BM Date: 12/25/12  Intake/Output from previous day: 04/16 0701 - 04/17 0700 In: 680 [I.V.:40; NG/GT:390; IV Piggyback:250] Out: 1100 [Urine:1100] Intake/Output this shift:    General appearance: no distress Head: scalp incision CDI Resp: clear to auscultation bilaterally Cardio: regular rate and rhythm GI: soft, NT, skull flap on L Extremities: PAS BLE, calves soft Neuro: PERL, opens eyes intermittantly, localizes LUE, WD LUE, no MVT BLE, does not track well, not F/C  Lab Results: CBC   Recent Labs  12/25/12 0420 12/26/12 0500  WBC 13.2* 14.2*  HGB 13.3 13.8  HCT 39.9 41.5  PLT 404* 455*   BMET  Recent Labs  12/25/12 0420 12/26/12 0500  NA 151* 153*  K 4.0 3.8  CL 112 115*  CO2 29 27  GLUCOSE 140* 164*  BUN 37* 34*  CREATININE 0.80 0.92  CALCIUM 10.1 10.0   PT/INR No results found for this basename: LABPROT, INR,  in the last 72 hours ABG No results found for this basename: PHART, PCO2, PO2, HCO3,  in the last 72 hours  Studies/Results: Mr Laqueta Jean Wo Contrast  12/24/2012  *RADIOLOGY REPORT*  Clinical Data: Subdural hematoma subarachnoid hemorrhage.  Cardiac arrest.  MRI HEAD WITHOUT AND WITH CONTRAST  Technique:  Multiplanar, multiecho pulse sequences of the brain and surrounding structures were obtained according to standard protocol without and with intravenous contrast  Contrast: 18mL MULTIHANCE GADOBENATE DIMEGLUMINE 529 MG/ML IV SOLN  Comparison: Several prior exams most recent is head CT from  12/21/2012.  Findings: Post left craniectomy for drainage of left-sided subdural hematoma.  Postoperative complex subdural collection extends through the craniotomy site with maximal thickness of 1.4 cm. Decrease in mass effect when compared to the 12/14/2012 exam.  Very small left parietal - occipital subdural hematoma.  Very small broad-based right temporal - occipital - parietal subdural hematoma.  Significant improvement in previously noted subarachnoid blood.  Bilateral anterior temporal lobe hemorrhagic contusions.  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  Involvement of the splenium of the corpus callosum greater on the left.  Anterior cerebral arteries may have been injured at the time of midline shift from left sided subdural hematoma.  Postcontrast imaging is significantly motion degraded. Gyriform enhancement of the medial aspect of the left occipital lobe. Findings suggest a subacute infarct.  The left posterior cerebral artery may been injured at the time of herniation.  Infection felt unlikely as a cause for above described findings.  Opacification left sphenoid sinus with complex appearance which may represent inspissated material.  Partial opacification/mucosal thickening mastoid air cells greater on the left.  Major intracranial vascular structures are patent.  IMPRESSION: Post left craniectomy for drainage of left-sided subdural hematoma. Postoperative complex subdural collection extends through the craniotomy site with maximal thickness of 1.4 cm.  Decrease in mass effect when compared to the 12/14/2012 exam.  Very small left parietal - occipital subdural hematoma.  Very small broad-based right temporal - occipital - parietal subdural hematoma.  Significant improvement in previously noted subarachnoid blood.  Bilateral anterior temporal lobe hemorrhagic contusions.  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   Involvement of the splenium of the corpus callosum greater on the left.  Anterior cerebral arteries may have been injured at the time of midline shift.  Postcontrast imaging significantly motion degraded. Gyriform enhancement of the medial aspect of the left occipital lobe. Findings suggest a subacute infarct.  The left posterior cerebral artery may been injured at the time of herniation.  Infection felt unlikely as a cause for above described findings.  Opacification left sphenoid sinus with complex appearance which may represent inspissated material.  Partial opacification/mucosal thickening mastoid air cells greater on the left.  Images reviewed with Dr. Jeral Fruit at the time imaging.   Original Report Authenticated By: Lacy Duverney, M.D.    Dg Abd Portable 1v  12/24/2012  *RADIOLOGY REPORT*  Clinical Data: Feeding tube placement at bedside.  PORTABLE ABDOMEN - 1 VIEW 12/24/2012 0928 hours:  Comparison: Portable abdomen x-ray 12/20/2012.  Findings: Feeding tube tip projects over the fundus of the stomach. Visualized bowel gas pattern unremarkable.  Interval removal of the skin staples overlying the left upper abdomen.  Tear drop shaped structure overlying the left upper abdomen as noted on the prior examination, again presumably post-surgical.  IMPRESSION: Feeding tube tip in the fundus of the stomach.   Original Report Authenticated By: Hulan Saas, M.D.     Anti-infectives: Anti-infectives   Start     Dose/Rate Route Frequency Ordered Stop   12/20/12 1745  ceFEPIme (MAXIPIME) 1 g in dextrose 5 % 50 mL IVPB     1 g 100 mL/hr over 30 Minutes Intravenous 3 times per day 12/20/12 1741     12/20/12 0900  levofloxacin (LEVAQUIN) IVPB 750 mg     750 mg 100 mL/hr over 90 Minutes Intravenous Every 24 hours 12/20/12 0848     12/18/12 2100  piperacillin-tazobactam (ZOSYN) IVPB 3.375 g  Status:  Discontinued     3.375 g 12.5 mL/hr over 240 Minutes Intravenous Every 8 hours 12/18/12 1852 12/19/12 1554    12/18/12 2000  vancomycin (VANCOCIN) IVPB 1000 mg/200 mL premix  Status:  Discontinued     1,000 mg 200 mL/hr over 60 Minutes Intravenous Every 8 hours 12/18/12 1852 12/19/12 1554   12/15/12 0800  ceFAZolin (ANCEF) IVPB 1 g/50 mL premix     1 g 100 mL/hr over 30 Minutes Intravenous Every 8 hours 12/15/12 0413 12/15/12 1604      Assessment/Plan: s/p Procedure(s): CRANIECTOMY HEMATOMA EVACUATION SUBDURAL WITH PLACEMENT OF BONE FLAP IN ABDOMINAL WALL 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 Jeral Fruit following, arouses and localizes, some purposeful spontaneous MVT, TBI team therapies, propranolol for storming.  Facial abrasions -- Local care Resp -- remains stable S/P extubation, good cough ID -- levaquin for H. Flu, Strep pneumo, and moraxella PNA, D/C cefepime FEN -- increase free water and change IVF for hypernatremia, tol TF, if rehab facility does not accept pandas may have to have NS move flap to allow PEG. VTE -- SCD's Dispo -- investigating possible low level TBI rehab facilities  LOS: 12 days    Violeta Gelinas, MD, MPH, FACS Pager: [phone]  12/26/2012

## 2012-12-26 NOTE — Progress Notes (Signed)
Patient ID: Albert [name], male   DOB: Jul 05, 1976, 37 y.o.   MRN: [id number] Febril. Seen by trauma. Open eyes but not f/c. Spoke with wife and gave her a copy of the mri.

## 2012-12-27 LAB — GLUCOSE, CAPILLARY
Glucose-Capillary: 126 mg/dL — ABNORMAL HIGH (ref 70–99)
Glucose-Capillary: 138 mg/dL — ABNORMAL HIGH (ref 70–99)
Glucose-Capillary: 138 mg/dL — ABNORMAL HIGH (ref 70–99)
Glucose-Capillary: 141 mg/dL — ABNORMAL HIGH (ref 70–99)
Glucose-Capillary: 146 mg/dL — ABNORMAL HIGH (ref 70–99)
Glucose-Capillary: 163 mg/dL — ABNORMAL HIGH (ref 70–99)

## 2012-12-27 LAB — BASIC METABOLIC PANEL
BUN: 33 mg/dL — ABNORMAL HIGH (ref 6–23)
CO2: 29 mEq/L (ref 19–32)
Calcium: 10.2 mg/dL (ref 8.4–10.5)
Chloride: 112 mEq/L (ref 96–112)
Creatinine, Ser: 0.7 mg/dL (ref 0.50–1.35)
GFR calc Af Amer: >90 mL/min (ref >90)
GFR calc non Af Amer: >90 mL/min (ref >90)
Glucose, Bld: 166 mg/dL — ABNORMAL HIGH (ref 70–99)
Potassium: 3.9 mEq/L (ref 3.5–5.1)
Sodium: 151 mEq/L — ABNORMAL HIGH (ref 135–145)

## 2012-12-27 MED ORDER — PIVOT 1.5 CAL PO LIQD
1000.0000 mL | ORAL | Status: DC
  Administered 2012-12-27: 1000 mL
  Filled 2012-12-27 (×8): qty 1000

## 2012-12-27 NOTE — Clinical Social Work Note (Signed)
Clinical Social Worker continuing to follow for support and discharge planning needs.  Patient family at bedside and very supportive of patient needs.  Patient family has agreed to pursue lower level BI program in [place] - CM working on possible placement pending patient progress and route of feeding.  CSW will remain available for support and to assist in facilitating patient discharge needs.  Macario Golds, [place] [phone]

## 2012-12-27 NOTE — Progress Notes (Signed)
[name], mother and friends were bedside when I arrived. Pt was in and out of sleep. Wife and family thankful for Chaplain visit and support. Marjory Lies Chaplain

## 2012-12-27 NOTE — Progress Notes (Signed)
Physical Therapy Treatment Patient Details Name: Albert [name] MRN: [id number] DOB: [birth year] Today's Date: 12/27/2012 Time: 4098-1191 PT Time Calculation (min): 51 min  PT Assessment / Plan / Recommendation Comments on Treatment Session  pt presents with fall (Etoh related) resulting in fronto-temporo-parietal SDH post Crani with bone flap in L UQ.  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  pt remains at a Rancho level II with generalized responses.  pt demos increased active movement of head/neck today and grimaces during peri hygiene.  Will follow.      Follow Up Recommendations  CIR     Does the patient have the potential to tolerate intense rehabilitation     Barriers to Discharge        Equipment Recommendations   (TBD)    Recommendations for Other Services    Frequency Min 3X/week   Plan Discharge plan remains appropriate;Frequency remains appropriate    Precautions / Restrictions Precautions Precautions: Fall Precaution Comments: pt with L Crani with drain and bone flap in L UQ.   Restrictions Weight Bearing Restrictions: No   Pertinent Vitals/Pain Does not indicate pain.      Mobility  Bed Mobility Bed Mobility: Rolling Right;Rolling Left;Scooting to HOB Rolling Right: 1: +2 Total assist Rolling Right: Patient Percentage: 0% Rolling Left: 1: +2 Total assist Rolling Left: Patient Percentage: 0% Scooting to HOB: 1: +2 Total assist Scooting to [facility]: Patient Percentage: 0% Details for Bed Mobility Assistance: pt found with large loose BM.  RN called to A with hygiene.  pt without any active movment to particpate in rolling.  Head and neck support off of pillow when rolling to L side 2/2 crani with bone flap removed.  pt fatigued after multiple rolls for hygiene.  After hygiene pt HOB elevated to 60 degrees with pt demonstrating active neck rotation when head supported against bed.  Still maintains gaze preference to L side.  Attempted   participation in familiar tasks of washing face, without active participation.   Transfers Transfers: Not assessed Ambulation/Gait Ambulation/Gait Assistance: Not tested (comment) Stairs: No Wheelchair Mobility Wheelchair Mobility: No    Exercises     PT Diagnosis:    PT Problem List:   PT Treatment Interventions:     PT Goals Acute Rehab PT Goals Time For Goal Achievement: 01/02/13 Potential to Achieve Goals: Good Additional Goals PT Goal: Additional Goal #1 - Progress: Progressing toward goal PT Goal: Additional Goal #2 - Progress: Progressing toward goal  Visit Information  Last PT Received On: 12/27/12 Assistance Needed: +2    Subjective Data      Cognition  Cognition Arousal/Alertness: Lethargic Overall Cognitive Status: Impaired/Different from baseline Area of Impairment: Attention;Memory;JFK Recovery Scale Current Attention Level: Focused General Comments: pt with spontaneous eye opening with gaze directed towards pt's L side and occasionally starts gaze at midline then drifts to Lt. Pt this session noted to have a slow drift to midline and then returning to left gaze deviation. Not characterized by a nystagmus Rancho Levels of Cognitive Functioning Rancho [email or web address] Scales of Cognitive Functioning: Generalized response    Balance  Balance Balance Assessed: No  End of Session PT - End of Session Activity Tolerance: Patient limited by fatigue Patient left: in bed;with call bell/phone within reach;with bed alarm set Nurse Communication: Mobility status   GP     Sunny Schlein, Nettie [phone] 12/27/2012, 2:38 PM

## 2012-12-27 NOTE — Progress Notes (Signed)
Patient ID: Albert [name], male   DOB: Feb 12, 1976, 37 y.o.   MRN: [id number] Patient neurologically stable continuing with rehabilitation flap soft

## 2012-12-27 NOTE — Progress Notes (Signed)
12 Days Post-Op  Subjective: No new changes Non verbal Tolerating tube feeds  Objective: Vital signs in last 24 hours: Temp:  [98.5 F (36.9 C)-100.3 F (37.9 C)] 100.3 F (37.9 C) (04/18 0529) Pulse Rate:  [78-86] 80 (04/18 0529) Resp:  [18-24] 24 (04/18 0529) BP: (143-150)/(81-100) 148/90 mmHg (04/18 0529) SpO2:  [94 %-98 %] 94 % (04/18 0529) Weight:  [195 lb 8.8 oz (88.7 kg)] 195 lb 8.8 oz (88.7 kg) (04/18 0500) Last BM Date: 12/25/12  Intake/Output from previous day: 04/17 0701 - 04/18 0700 In: 1014 [I.V.:264; NG/GT:600; IV Piggyback:150] Out: 300 [Urine:300] Intake/Output this shift:    Comfortable Lungs clear Abdomen soft  Lab Results:   Recent Labs  12/25/12 0420 12/26/12 0500  WBC 13.2* 14.2*  HGB 13.3 13.8  HCT 39.9 41.5  PLT 404* 455*   BMET  Recent Labs  12/26/12 0500 12/27/12 0520  NA 153* 151*  K 3.8 3.9  CL 115* 112  CO2 27 29  GLUCOSE 164* 166*  BUN 34* 33*  CREATININE 0.92 0.70  CALCIUM 10.0 10.2   PT/INR No results found for this basename: LABPROT, INR,  in the last 72 hours ABG No results found for this basename: PHART, PCO2, PO2, HCO3,  in the last 72 hours  Studies/Results: No results found.  Anti-infectives: Anti-infectives   Start     Dose/Rate Route Frequency Ordered Stop   12/20/12 1745  ceFEPIme (MAXIPIME) 1 g in dextrose 5 % 50 mL IVPB  Status:  Discontinued     1 g 100 mL/hr over 30 Minutes Intravenous 3 times per day 12/20/12 1741 12/26/12 0859   12/20/12 0900  levofloxacin (LEVAQUIN) IVPB 750 mg     750 mg 100 mL/hr over 90 Minutes Intravenous Every 24 hours 12/20/12 0848 12/30/12 0859   12/18/12 2100  piperacillin-tazobactam (ZOSYN) IVPB 3.375 g  Status:  Discontinued     3.375 g 12.5 mL/hr over 240 Minutes Intravenous Every 8 hours 12/18/12 1852 12/19/12 1554   12/18/12 2000  vancomycin (VANCOCIN) IVPB 1000 mg/200 mL premix  Status:  Discontinued     1,000 mg 200 mL/hr over 60 Minutes Intravenous Every 8  hours 12/18/12 1852 12/19/12 1554   12/15/12 0800  ceFAZolin (ANCEF) IVPB 1 g/50 mL premix     1 g 100 mL/hr over 30 Minutes Intravenous Every 8 hours 12/15/12 0413 12/15/12 1604      Assessment/Plan: s/p Procedure(s): CRANIECTOMY HEMATOMA EVACUATION SUBDURAL WITH PLACEMENT OF BONE FLAP IN ABDOMINAL WALL (Left)  Continue tube feeds, iv antibiotics, PT  LOS: 13 days    Kaitlyn Skowron A 12/27/2012

## 2012-12-27 NOTE — Progress Notes (Signed)
At 1430 called into patients room by his mother, pt was restless moving his head from side to side, thrashing legs around,  Mother stated she felt his head was more swollen on the left side of his head,  Was anxious over this.  Medicated patient with IV ativan, and pt quieted down.  Temp up slightly to 100.3 had been 99.5 or so prior to this.

## 2012-12-27 NOTE — Progress Notes (Addendum)
NUTRITION FOLLOW UP  Intervention:   May need additional free water recommend increase to 350 ml every 6 hours (total free water would be 2766 ml)  Increase Pivot 1.5 to 75 ml/hr   TF regimen provides: 2700 kcal (>100% of needs) and 168 grams protein ( >100% of minimum needs), and 1366 ml H2O. Total free water 2566 ml.  Nutrition Dx:   Inadequate oral intake related to inability to eat as evidenced by NPO status; ongoing.  Goal:   Pt to meet >/= 90% of their estimated nutrition needs; met  Monitor:   TF tolerance, weight, labs  Assessment:   Pt admitted after fall, positive for ETOH. Has SDH and SAH, multiple facial fractures, s/p decompressive craniectomy of L SDH. Pt with questionable cardiac arrest with CPR. Pt discussed during rounds and with RN.  Pt extubated 4/14. Unable to pass swallow eval at this time, feeding tube to be replaced and TF resumed. Pt with persistent fevers of 99-101.  Na remains elevated but is decreasing, free water flushes changed 4/17.   Per MD notes, plan is for low level TBI rehab. May need bone flap moved for PEG if facility does not accept nasogastric feeding tube.   300 ml H2O every 6 hours   Height: Ht Readings from Last 1 Encounters:  12/15/12 5\' 11"  (1.803 m)    Weight Status:   Wt Readings from Last 1 Encounters:  12/27/12 195 lb 8.8 oz (88.7 kg)  Admission weight 216 lb Usual weight: 203-204 lb  Re-estimated needs:  Kcal: 2700-2800  Protein: 139-157 grams Fluid: > 2.7 L/day  Skin: incisions  Diet Order: NPO   Intake/Output Summary (Last 24 hours) at 12/27/12 0933 Last data filed at 12/26/12 1800  Gross per 24 hour  Intake    714 ml  Output    300 ml  Net    414 ml    Last BM: 4/16   Labs:   Recent Labs Lab 12/25/12 0420 12/26/12 0500 12/27/12 0520  NA 151* 153* 151*  K 4.0 3.8 3.9  CL 112 115* 112  CO2 29 27 29   BUN 37* 34* 33*  CREATININE 0.80 0.92 0.70  CALCIUM 10.1 10.0 10.2  GLUCOSE 140* 164* 166*     CBG (last 3)   Recent Labs  12/27/12 0009 12/27/12 0447 12/27/12 0802  GLUCAP 138* 163* 146*    Scheduled Meds: . antiseptic oral rinse  15 mL Mouth Rinse QID  . chlorhexidine  15 mL Mouth Rinse BID  . free water  300 mL Per Tube Q6H  . insulin aspart  0-15 Units Subcutaneous Q4H  . levETIRAcetam  500 mg Per Tube Q12H  . levofloxacin (LEVAQUIN) IV  750 mg Intravenous Q24H  . multivitamin  5 mL Oral Daily  . pantoprazole sodium  40 mg Per Tube QHS  . propranolol  40 mg Per Tube Q8H  . sodium chloride  10-40 mL Intracatheter Q12H    Continuous Infusions: . feeding supplement (PIVOT 1.5 CAL) 1,000 mL (12/27/12 0309)  . fentaNYL infusion INTRAVENOUS Stopped (12/23/12 0715)  . sodium chloride 0.45 % 1,000 mL infusion 30 mL/hr at 12/26/12 [facility]    Kendell Bane RD, LDN, CNSC [phone] Pager [phone] After Hours Pager

## 2012-12-28 ENCOUNTER — Inpatient Hospital Stay (HOSPITAL_COMMUNITY): Payer: BC Managed Care – PPO

## 2012-12-28 ENCOUNTER — Encounter (HOSPITAL_COMMUNITY): Admission: EM | Disposition: A | Discharge status: Rehabilitation Facility | Payer: Self-pay | Source: Home / Self Care

## 2012-12-28 ENCOUNTER — Encounter (HOSPITAL_COMMUNITY): Payer: Self-pay | Admitting: Anesthesiology

## 2012-12-28 ENCOUNTER — Inpatient Hospital Stay (HOSPITAL_COMMUNITY): Payer: BC Managed Care – PPO | Admitting: Anesthesiology

## 2012-12-28 HISTORY — PX: CRANIOTOMY: SHX93

## 2012-12-28 LAB — URINALYSIS, ROUTINE W REFLEX MICROSCOPIC
Bilirubin Urine: NEGATIVE
Glucose, UA: NEGATIVE mg/dL
Hgb urine dipstick: NEGATIVE
Ketones, ur: NEGATIVE mg/dL
Leukocytes, UA: NEGATIVE
Nitrite: NEGATIVE
Protein, ur: NEGATIVE mg/dL
Specific Gravity, Urine: 1.026 (ref 1.005–1.030)
Urobilinogen, UA: 1 mg/dL (ref 0.0–1.0)
pH: 6 (ref 5.0–8.0)

## 2012-12-28 LAB — GLUCOSE, CAPILLARY
Glucose-Capillary: 115 mg/dL — ABNORMAL HIGH (ref 70–99)
Glucose-Capillary: 130 mg/dL — ABNORMAL HIGH (ref 70–99)
Glucose-Capillary: 149 mg/dL — ABNORMAL HIGH (ref 70–99)
Glucose-Capillary: 86 mg/dL (ref 70–99)
Glucose-Capillary: 186 mg/dL — ABNORMAL HIGH (ref 70–99)
Glucose-Capillary: 135 mg/dL — ABNORMAL HIGH (ref 70–99)

## 2012-12-28 LAB — CBC
HCT: 38 % — ABNORMAL LOW (ref 39.0–52.0)
Hemoglobin: 13 g/dL (ref 13.0–17.0)
MCH: 29.3 pg (ref 26.0–34.0)
MCHC: 34.2 g/dL (ref 30.0–36.0)
MCV: 85.6 fL (ref 78.0–100.0)
Platelets: 463 10*3/uL — ABNORMAL HIGH (ref 150–400)
RBC: 4.44 MIL/uL (ref 4.22–5.81)
RDW: 12.5 % (ref 11.5–15.5)
WBC: 18.8 10*3/uL — ABNORMAL HIGH (ref 4.0–10.5)

## 2012-12-28 LAB — BASIC METABOLIC PANEL
BUN: 33 mg/dL — ABNORMAL HIGH (ref 6–23)
CO2: 28 mEq/L (ref 19–32)
Calcium: 9.6 mg/dL (ref 8.4–10.5)
Chloride: 105 mEq/L (ref 96–112)
Creatinine, Ser: 0.7 mg/dL (ref 0.50–1.35)
GFR calc Af Amer: >90 mL/min (ref >90)
GFR calc non Af Amer: >90 mL/min (ref >90)
Glucose, Bld: 152 mg/dL — ABNORMAL HIGH (ref 70–99)
Potassium: 3.7 mEq/L (ref 3.5–5.1)
Sodium: 144 mEq/L (ref 135–145)

## 2012-12-28 SURGERY — CRANIOTOMY HEMATOMA EVACUATION SUBDURAL
Anesthesia: General | Site: Head | Laterality: Left | Wound class: Clean

## 2012-12-28 MED ORDER — PROMETHAZINE HCL 25 MG PO TABS: 12.5000 mg | ORAL_TABLET | ORAL | Status: DC | PRN

## 2012-12-28 MED ORDER — SODIUM CHLORIDE 0.9 % IV SOLN
INTRAVENOUS | Status: DC | PRN
  Administered 2012-12-28: 17:00:00 via INTRAVENOUS

## 2012-12-28 MED ORDER — LEVETIRACETAM 100 MG/ML PO SOLN
1000.0000 mg | Freq: Two times a day (BID) | ORAL | Status: DC
  Administered 2012-12-28: 1000 mg
  Filled 2012-12-28 (×4): qty 10

## 2012-12-28 MED ORDER — HALOPERIDOL LACTATE 5 MG/ML IJ SOLN
INTRAMUSCULAR | Status: AC
  Administered 2012-12-28: 5 mg
  Filled 2012-12-28: qty 1

## 2012-12-28 MED ORDER — PANTOPRAZOLE SODIUM 40 MG IV SOLR
40.0000 mg | Freq: Every day | INTRAVENOUS | Status: DC
  Administered 2012-12-28 – 2012-12-29 (×2): 40 mg via INTRAVENOUS
  Filled 2012-12-28 (×3): qty 40

## 2012-12-28 MED ORDER — BUPIVACAINE HCL (PF) 0.5 % IJ SOLN
INTRAMUSCULAR | Status: DC | PRN
  Administered 2012-12-28: 5 mL

## 2012-12-28 MED ORDER — MORPHINE SULFATE 2 MG/ML IJ SOLN: 1.0000 mg | INTRAMUSCULAR | Status: DC | PRN

## 2012-12-28 MED ORDER — ONDANSETRON HCL 4 MG PO TABS: 4.0000 mg | ORAL_TABLET | ORAL | Status: DC | PRN

## 2012-12-28 MED ORDER — SODIUM CHLORIDE 0.9 % IV SOLN
1500.0000 mg | Freq: Two times a day (BID) | INTRAVENOUS | Status: DC
  Administered 2012-12-29 – 2013-01-03 (×11): 1500 mg via INTRAVENOUS
  Filled 2012-12-28 (×13): qty 15

## 2012-12-28 MED ORDER — VECURONIUM BROMIDE 10 MG IV SOLR
INTRAVENOUS | Status: DC | PRN
  Administered 2012-12-28: 6 mg via INTRAVENOUS
  Administered 2012-12-28 (×2): 2 mg via INTRAVENOUS

## 2012-12-28 MED ORDER — BACITRACIN ZINC 500 UNIT/GM EX OINT
TOPICAL_OINTMENT | CUTANEOUS | Status: DC | PRN
  Administered 2012-12-28: 1 via TOPICAL

## 2012-12-28 MED ORDER — SODIUM CHLORIDE 0.9 % IV SOLN
1000.0000 mg | Freq: Once | INTRAVENOUS | Status: AC
  Administered 2012-12-28: 1000 mg via INTRAVENOUS
  Filled 2012-12-28: qty 10

## 2012-12-28 MED ORDER — THROMBIN 20000 UNITS EX SOLR
CUTANEOUS | Status: DC | PRN
  Administered 2012-12-28: 18:00:00 via TOPICAL

## 2012-12-28 MED ORDER — PHENYTOIN SODIUM 50 MG/ML IJ SOLN
100.0000 mg | Freq: Three times a day (TID) | INTRAMUSCULAR | Status: DC
  Administered 2012-12-28 – 2013-01-03 (×18): 100 mg via INTRAVENOUS
  Filled 2012-12-28 (×21): qty 2

## 2012-12-28 MED ORDER — FENTANYL CITRATE 0.05 MG/ML IJ SOLN
INTRAMUSCULAR | Status: DC | PRN
  Administered 2012-12-28 (×2): 125 ug via INTRAVENOUS

## 2012-12-28 MED ORDER — LIDOCAINE-EPINEPHRINE 1 %-1:100000 IJ SOLN
INTRAMUSCULAR | Status: DC | PRN
  Administered 2012-12-28: 5 mL

## 2012-12-28 MED ORDER — SODIUM CHLORIDE 0.9 % IV SOLN
1000.0000 mg | Freq: Two times a day (BID) | INTRAVENOUS | Status: DC
  Administered 2012-12-28: 1000 mg via INTRAVENOUS
  Filled 2012-12-28 (×2): qty 10

## 2012-12-28 MED ORDER — ONDANSETRON HCL 4 MG/2ML IJ SOLN: 4.0000 mg | INTRAMUSCULAR | Status: DC | PRN

## 2012-12-28 MED ORDER — ACETAMINOPHEN 325 MG PO TABS: 650.0000 mg | ORAL_TABLET | ORAL | Status: DC | PRN

## 2012-12-28 MED ORDER — SODIUM CHLORIDE 0.9 % IV SOLN
150.0000 mg | Freq: Two times a day (BID) | INTRAVENOUS | Status: DC
  Administered 2012-12-29 – 2013-01-03 (×11): 150 mg via INTRAVENOUS
  Filled 2012-12-28 (×22): qty 15

## 2012-12-28 MED ORDER — CEFAZOLIN SODIUM 1-5 GM-% IV SOLN
1.0000 g | Freq: Three times a day (TID) | INTRAVENOUS | Status: AC
  Administered 2012-12-28 – 2012-12-29 (×3): 1 g via INTRAVENOUS
  Filled 2012-12-28 (×3): qty 50

## 2012-12-28 MED ORDER — ACETAMINOPHEN 650 MG RE SUPP: 650.0000 mg | RECTAL | Status: DC | PRN

## 2012-12-28 MED ORDER — POTASSIUM CHLORIDE IN NACL 20-0.9 MEQ/L-% IV SOLN
INTRAVENOUS | Status: DC
  Administered 2012-12-28: 21:00:00 via INTRAVENOUS
  Administered 2012-12-30: 1000 mL via INTRAVENOUS
  Administered 2012-12-30 – 2013-01-02 (×5): via INTRAVENOUS
  Filled 2012-12-28 (×12): qty 1000

## 2012-12-28 MED ORDER — CEFAZOLIN SODIUM-DEXTROSE 2-3 GM-% IV SOLR
INTRAVENOUS | Status: DC | PRN
  Administered 2012-12-28: 2 g via INTRAVENOUS

## 2012-12-28 MED ORDER — LORAZEPAM BOLUS VIA INFUSION
1.0000 mg | Freq: Once | INTRAVENOUS | Status: DC
  Filled 2012-12-28: qty 1

## 2012-12-28 MED ORDER — LABETALOL HCL 5 MG/ML IV SOLN: 10.0000 mg | INTRAVENOUS | Status: DC | PRN

## 2012-12-28 MED ORDER — PROPOFOL INFUSION 10 MG/ML OPTIME
INTRAVENOUS | Status: DC | PRN
  Administered 2012-12-28: 25 ug/kg/min via INTRAVENOUS

## 2012-12-28 MED ORDER — PROPOFOL 10 MG/ML IV BOLUS
INTRAVENOUS | Status: DC | PRN
  Administered 2012-12-28: 150 mg via INTRAVENOUS
  Administered 2012-12-28: 50 mg via INTRAVENOUS

## 2012-12-28 MED ORDER — ARTIFICIAL TEARS OP OINT
TOPICAL_OINTMENT | OPHTHALMIC | Status: DC | PRN
  Administered 2012-12-28: 1 via OPHTHALMIC

## 2012-12-28 MED ORDER — CEFAZOLIN SODIUM-DEXTROSE 2-3 GM-% IV SOLR
INTRAVENOUS | Status: AC
  Filled 2012-12-28: qty 50

## 2012-12-28 MED ORDER — HYDROMORPHONE HCL PF 1 MG/ML IJ SOLN: 0.2500 mg | INTRAMUSCULAR | Status: DC | PRN

## 2012-12-28 MED ORDER — HYDROCODONE-ACETAMINOPHEN 5-325 MG PO TABS: 1.0000 | ORAL_TABLET | ORAL | Status: DC | PRN

## 2012-12-28 MED ORDER — MIDAZOLAM HCL 5 MG/5ML IJ SOLN
INTRAMUSCULAR | Status: DC | PRN
  Administered 2012-12-28 (×2): 2 mg via INTRAVENOUS

## 2012-12-28 MED ORDER — SODIUM CHLORIDE 0.9 % IV SOLN
1750.0000 mg | Freq: Once | INTRAVENOUS | Status: AC
  Administered 2012-12-28: 1750 mg via INTRAVENOUS
  Filled 2012-12-28: qty 35

## 2012-12-28 MED ORDER — 0.9 % SODIUM CHLORIDE (POUR BTL) OPTIME
TOPICAL | Status: DC | PRN
  Administered 2012-12-28 (×3): 1000 mL

## 2012-12-28 MED ORDER — PROPOFOL 10 MG/ML IV EMUL
5.0000 ug/kg/min | INTRAVENOUS | Status: DC
  Filled 2012-12-28: qty 100

## 2012-12-28 MED ORDER — ONDANSETRON HCL 4 MG/2ML IJ SOLN: 4.0000 mg | Freq: Once | INTRAMUSCULAR | Status: AC | PRN

## 2012-12-28 MED ORDER — LACOSAMIDE 200 MG/20ML IV SOLN
300.0000 mg | Freq: Once | INTRAVENOUS | Status: AC
  Administered 2012-12-28: 300 mg via INTRAVENOUS
  Filled 2012-12-28: qty 30

## 2012-12-28 MED ORDER — LEVETIRACETAM 100 MG/ML PO SOLN
1500.0000 mg | Freq: Two times a day (BID) | ORAL | Status: DC
Start: 2012-12-28 — End: 2012-12-28
  Filled 2012-12-28: qty 15

## 2012-12-28 SURGICAL SUPPLY — 78 items
APL SKNCLS STERI-STRIP NONHPOA (GAUZE/BANDAGES/DRESSINGS)
BANDAGE GAUZE 4  KLING STR (GAUZE/BANDAGES/DRESSINGS) IMPLANT
BANDAGE GAUZE ELAST BULKY 4 IN (GAUZE/BANDAGES/DRESSINGS) IMPLANT
BENZOIN TINCTURE PRP APPL 2/3 (GAUZE/BANDAGES/DRESSINGS) IMPLANT
BIT DRILL WIRE PASS 1.3MM (BIT) IMPLANT
BRUSH SCRUB EZ 1% IODOPHOR (MISCELLANEOUS) ×1 IMPLANT
BRUSH SCRUB EZ PLAIN DRY (MISCELLANEOUS) ×3 IMPLANT
BUR ACORN 6.0 PRECISION (BURR) ×1 IMPLANT
BUR ROUTER D-58 CRANI (BURR) IMPLANT
CANISTER SUCTION 2500CC (MISCELLANEOUS) ×3 IMPLANT
CATH ROBINSON RED A/P 12FR (CATHETERS) IMPLANT
CLIP TI MEDIUM 6 (CLIP) IMPLANT
CLOTH BEACON ORANGE TIMEOUT ST (SAFETY) ×2 IMPLANT
CONT SPEC 4OZ CLIKSEAL STRL BL (MISCELLANEOUS) ×3 IMPLANT
CORDS BIPOLAR (ELECTRODE) ×2 IMPLANT
DRAIN JACKSON PRATT 10MM FLAT (MISCELLANEOUS) ×1 IMPLANT
DRAIN PENROSE 1/2X12 LTX STRL (WOUND CARE) IMPLANT
DRAIN SNY WOU 7FLT (WOUND CARE) IMPLANT
DRAPE NEUROLOGICAL W/INCISE (DRAPES) ×2 IMPLANT
DRAPE PROXIMA HALF (DRAPES) ×1 IMPLANT
DRAPE SURG IRRIG POUCH 19X23 (DRAPES) IMPLANT
DRAPE WARM FLUID 44X44 (DRAPE) ×2 IMPLANT
DRESSING TELFA 8X3 (GAUZE/BANDAGES/DRESSINGS) ×4 IMPLANT
DRILL WIRE PASS 1.3MM (BIT)
DRSG OPSITE 4X5.5 SM (GAUZE/BANDAGES/DRESSINGS) ×8 IMPLANT
DRSG PAD ABDOMINAL 8X10 ST (GAUZE/BANDAGES/DRESSINGS) IMPLANT
DURAMATRIX ONLAY 3X3 (Plate) ×1 IMPLANT
DURAPREP 26ML APPLICATOR (WOUND CARE) ×2 IMPLANT
DURAPREP 6ML APPLICATOR 50/CS (WOUND CARE) ×2 IMPLANT
ELECT CAUTERY BLADE 6.4 (BLADE) ×1 IMPLANT
ELECT REM PT RETURN 9FT ADLT (ELECTROSURGICAL) ×2
ELECTRODE REM PT RTRN 9FT ADLT (ELECTROSURGICAL) ×1 IMPLANT
EVACUATOR SILICONE 100CC (DRAIN) ×1 IMPLANT
GAUZE SPONGE 4X4 16PLY XRAY LF (GAUZE/BANDAGES/DRESSINGS) IMPLANT
GLOVE BIO SURGEON STRL SZ 6.5 (GLOVE) ×2 IMPLANT
GLOVE BIO SURGEON STRL SZ8 (GLOVE) ×2 IMPLANT
GLOVE BIOGEL PI IND STRL 8 (GLOVE) ×1 IMPLANT
GLOVE BIOGEL PI IND STRL 8.5 (GLOVE) ×1 IMPLANT
GLOVE BIOGEL PI INDICATOR 8 (GLOVE)
GLOVE BIOGEL PI INDICATOR 8.5 (GLOVE) ×1
GLOVE ECLIPSE 7.5 STRL STRAW (GLOVE) ×1 IMPLANT
GLOVE EXAM NITRILE LRG STRL (GLOVE) IMPLANT
GLOVE EXAM NITRILE MD LF STRL (GLOVE) IMPLANT
GLOVE EXAM NITRILE XL STR (GLOVE) IMPLANT
GLOVE EXAM NITRILE XS STR PU (GLOVE) IMPLANT
GOWN BRE IMP SLV AUR LG STRL (GOWN DISPOSABLE) ×1 IMPLANT
GOWN BRE IMP SLV AUR XL STRL (GOWN DISPOSABLE) ×1 IMPLANT
GOWN STRL REIN 2XL LVL4 (GOWN DISPOSABLE) IMPLANT
HEMOSTAT SURGICEL 2X14 (HEMOSTASIS) ×2 IMPLANT
KIT BASIN OR (CUSTOM PROCEDURE TRAY) ×2 IMPLANT
KIT ROOM TURNOVER OR (KITS) ×2 IMPLANT
NDL HYPO 25X1 1.5 SAFETY (NEEDLE) ×1 IMPLANT
NEEDLE HYPO 25X1 1.5 SAFETY (NEEDLE) ×2 IMPLANT
NS IRRIG 1000ML POUR BTL (IV SOLUTION) ×4 IMPLANT
PACK CRANIOTOMY (CUSTOM PROCEDURE TRAY) ×2 IMPLANT
PAD ARMBOARD 7.5X6 YLW CONV (MISCELLANEOUS) ×4 IMPLANT
PATTIES SURGICAL .5 X.5 (GAUZE/BANDAGES/DRESSINGS) IMPLANT
PATTIES SURGICAL .5 X3 (DISPOSABLE) IMPLANT
PATTIES SURGICAL 1X1 (DISPOSABLE) IMPLANT
PIN MAYFIELD SKULL DISP (PIN) IMPLANT
SPECIMEN JAR SMALL (MISCELLANEOUS) IMPLANT
SPONGE GAUZE 4X4 12PLY (GAUZE/BANDAGES/DRESSINGS) ×3 IMPLANT
SPONGE NEURO XRAY DETECT 1X3 (DISPOSABLE) IMPLANT
SPONGE SURGIFOAM ABS GEL 12-7 (HEMOSTASIS) IMPLANT
STAPLER SKIN PROX WIDE 3.9 (STAPLE) ×3 IMPLANT
SUT ETHILON 3 0 PS 1 (SUTURE) IMPLANT
SUT NURALON 4 0 TR CR/8 (SUTURE) ×3 IMPLANT
SUT VIC AB 2-0 CP2 18 (SUTURE) ×5 IMPLANT
SUT VIC AB 3-0 SH 8-18 (SUTURE) ×2 IMPLANT
SYR 20ML ECCENTRIC (SYRINGE) ×2 IMPLANT
SYR CONTROL 10ML LL (SYRINGE) ×1 IMPLANT
TOWEL OR 17X24 6PK STRL BLUE (TOWEL DISPOSABLE) ×2 IMPLANT
TOWEL OR 17X26 10 PK STRL BLUE (TOWEL DISPOSABLE) ×2 IMPLANT
TRAP SPECIMEN MUCOUS 40CC (MISCELLANEOUS) IMPLANT
TRAY FOLEY CATH 14FRSI W/METER (CATHETERS) IMPLANT
TUBE CONNECTING 12X1/4 (SUCTIONS) ×1 IMPLANT
UNDERPAD 30X30 INCONTINENT (UNDERPADS AND DIAPERS) IMPLANT
WATER STERILE IRR 1000ML POUR (IV SOLUTION) ×2 IMPLANT

## 2012-12-28 NOTE — Procedures (Signed)
History: 37 yo M with SDH s/p evacuation and recurrent seizures.  Sedation: none  Background: There is Delta activity with superimposed beta on the left hemisphere, maximal at T7, P3, C3, P7. This becomes rhythmic and organized and then evolves into a clear electrographic seizure at 1:28 pm lasting approximately 6 minutes this gives way to a disorganized delta pattern. Just before the end of the rcording, there was again seen an organized rhythmic delta activity which again yielded to a disorganized irregular delta after 2 minutes.   Photic stimulation: Physiologic driving is not performed.   EEG Abnormalities: 1) Two electrographic seizures arising from the left parieto temporal area.  2) Irregualr slow activity on the left > right 3) Breech rhythm   Clinical Interpretation: This abnormal EEG recorded two electrogrpahic seizure arising from the left parietotemproal, there was also evidence of cerebral dysfunction in this region.   Ritta Slot, MD [facility] [phone]  If 7pm- 7am, please page neurology on call at [phone].

## 2012-12-28 NOTE — Anesthesia Procedure Notes (Signed)
Procedure Name: Intubation Date/Time: 12/28/2012 5:15 PM Performed by: Tyrone Nine Pre-anesthesia Checklist: Patient identified, Timeout performed, Emergency Drugs available, Suction available and Patient being monitored Patient Re-evaluated:Patient Re-evaluated prior to inductionOxygen Delivery Method: Circle system utilized Preoxygenation: Pre-oxygenation with 100% oxygen Intubation Type: IV induction Ventilation: Two handed mask ventilation required Laryngoscope Size: Mac and 4 Grade View: Grade I Tube type: Oral Tube size: 8.0 mm Number of attempts: 1 Airway Equipment and Method: Stylet Placement Confirmation: ETT inserted through vocal cords under direct vision,  breath sounds checked- equal and bilateral and positive ETCO2 Secured at: 23 cm Tube secured with: Tape Dental Injury: Teeth and Oropharynx as per pre-operative assessment  Comments: Pt with full Annia Friendly

## 2012-12-28 NOTE — Progress Notes (Signed)
Sedated on vent.  PERRL.  Flap soft.  No seizure activity. Will monitor.

## 2012-12-28 NOTE — Progress Notes (Signed)
Patient had new onset of seizures, responded to ativan, then recurred.  Sleepy after ativan.  Taken for stat head CT which shows new intraparenchymal hematoma in craniotomy defect without significant midline shift.  Na 144.  I have increased keppra to 1000 mg BID and transferred patient to NICU for closer monitoring.  Repeat Head CT ordered for AM.  No need for hematoma evacuation at present, but likely explanation for new seizures.

## 2012-12-28 NOTE — Progress Notes (Signed)
EEG completed.

## 2012-12-28 NOTE — Op Note (Signed)
12/14/2012 - 12/28/2012  7:23 PM  PATIENT:  Albert [name]  37 y.o. male  PRE-OPERATIVE DIAGNOSIS:  intercerebral hemorrhage with status epilepticus  POST-OPERATIVE DIAGNOSIS:  intercerebral hemorrhage with status epilepticus PROCEDURE:  Procedure(s): CRANIOTOMY HEMATOMA EVACUATION SUBDURAL (Left) with revision of bone flap placement in abdomen  SURGEON:  Surgeon(s) and Role:    * Maeola Harman, MD - Primary  PHYSICIAN ASSISTANT:   ASSISTANTS: none   ANESTHESIA:   general  EBL:     BLOOD ADMINISTERED:none  DRAINS: (10) Jackson-Pratt drain(s) with closed bulb suction in the epidural space   LOCAL MEDICATIONS USED:  MARCAINE     SPECIMEN:  Excision  DISPOSITION OF SPECIMEN:  PATHOLOGY  COUNTS:  YES  TOURNIQUET:  * No tourniquets in log *  DICTATION: DICTATION: Patient is 37 year old man who fell and struck his head.  He had a craniotomy and evacuation of SDH with placement of bone flap in left side of abdomen.  He started seizing today and we were unable to stop this despite ativan and two AED's.  Neurology asked me to take patient to OR to remove new ICH/SDH as CT shows left frontal intracerebral hematoma with subdural hematoma with increasing shift and mass effect.  It was elected to take patient to surgery for redo craniotomy for ICH and SDH.  Procedure:  Following smooth intubation, patient was placed in right semi-lateral position with blanket roll.  Head was placed on donut head holder and left frontal scalp was shaved and prepped and draped in usual sterile fashion. His abdominal wall was also shaved, prepped and draped with betadine scrub and Duraprep.  Area of planned incision was infiltrated with lidocaine over abdomen. Prior incision was reopened and carried through temporalis fascia and muscle to expose the hematoma and brain.  Dura was elevated exposing subdural hematoma.  Dura was opened and subdural was evacuated. There was a large amount of what appeared to be  organized clot and fibrin and this was sent to Pathology.   Hemostasis was assured.  The brain was considerably more relaxed after hematoma evacuation.  The left upper quadrant incision was reopened and the bone flap was removed.  A new incision was made in the right upper quadrant and a new subcutaneous pocket was created.  The bone flap was inserted on the right side, so that later, a PEG tube could be placed on the left. The abdominal incisions were closed with 2-0 and 3-0 vicryl sutures and staples and a sterile occlusive dressing was placed. The dura was closed with 4-0 neurilon sutures and a dural patch graft was placed,  the fascia and galea were closed with 2-0 vicryl sutures and the skin was re approximated with staples.  A sterile occlusive dressing was placed.  Patient was returned to a supine position and moved to the gurney and transferred to the ICU for postoperative recovery, having tolerated his surgery well. Counts were correct at the conclusion of the surgery.   PLAN OF CARE: Admit to inpatient   PATIENT DISPOSITION:  PACU - hemodynamically stable.   Delay start of Pharmacological VTE agent (>24hrs) due to surgical blood loss or risk of bleeding: yes

## 2012-12-28 NOTE — Progress Notes (Signed)
13 Days Post-Op  Subjective: Events noted this am. See my PA note. Stat head ct obtained for new sz activity and transferred to 3100  Objective: Vital signs in last 24 hours: Temp:  [98.6 F (37 C)-101 F (38.3 C)] 100.9 F (38.3 C) (04/19 1028) Pulse Rate:  [78-154] 95 (04/19 1300) Resp:  [18-28] 28 (04/19 1230) BP: (131-157)/(76-104) 149/98 mmHg (04/19 1300) SpO2:  [90 %-100 %] 99 % (04/19 1300) Weight:  [199 lb 4.7 oz (90.4 kg)] 199 lb 4.7 oz (90.4 kg) (04/19 0500) Last BM Date: 12/27/12  Intake/Output from previous day: 04/18 0701 - 04/19 0700 In: 0  Out: 2001 [Urine:2000; Stool:1] Intake/Output this shift: Total I/O In: 340 [I.V.:20; NG/GT:20; IV Piggyback:300] Out: -   Nonverbal, No fc; pupils slightly unequal but reactive cta b/l Soft, nd. Incision c/d/i No edema  Lab Results:   Recent Labs  12/26/12 0500 12/28/12 0905  WBC 14.2* 18.8*  HGB 13.8 13.0  HCT 41.5 38.0*  PLT 455* 463*   BMET  Recent Labs  12/27/12 0520 12/28/12 0905  NA 151* 144  K 3.9 3.7  CL 112 105  CO2 29 28  GLUCOSE 166* 152*  BUN 33* 33*  CREATININE 0.70 0.70  CALCIUM 10.2 9.6   PT/INR No results found for this basename: LABPROT, INR,  in the last 72 hours ABG No results found for this basename: PHART, PCO2, PO2, HCO3,  in the last 72 hours  Studies/Results: Ct Head Wo Contrast  12/28/2012  *RADIOLOGY REPORT*  Clinical Data: Seizures this morning.  Left eye deviation and tonic clonic contractions.  CT HEAD WITHOUT CONTRAST  Technique:  Contiguous axial images were obtained from the base of the skull through the vertex without contrast.  Comparison: MRI 12/25/2010.  CT 12/21/2012.  Findings: Left craniectomy is present.  Since the prior CT and MRI, there has been hemorrhage into the craniectomy defect.  The craniectomy involves the frontal, parietal and temporal bones.  There is a hematoma in the craniectomy bed measuring 7 cm AP, 24 mm transverse.  This is extensive in the  cranial caudal direction, extending the full length of the craniectomy defect (7 cm).  There is hematocrit in this hematoma, with low attenuation fluid more anteriorly blends with the fat along the scalp.  Mass effect on the left cerebral hemisphere is mild as of subarachnoid blood.  There is however, left to right midline shift is 6 mm which is new compared to the prior CT and MRI.  No intraventricular blood is identified.  Subdural blood is present along the falx and left tentorium cerebelli.  Motion artifact is present on the examination.  Paranasal sinus disease appears similar.  IMPRESSION:  1.  New left craniectomy bed hematoma with mass effect on the left cerebral hemisphere and new 6 mm of left to right midline shift. 2.  These results will be called to the ordering clinician or representative by the Radiologist Assistant, and communication documented in the PACS Dashboard.   Original Report Authenticated By: Andreas Newport, M.D.     Anti-infectives: Anti-infectives   Start     Dose/Rate Route Frequency Ordered Stop   12/20/12 1745  ceFEPIme (MAXIPIME) 1 g in dextrose 5 % 50 mL IVPB  Status:  Discontinued     1 g 100 mL/hr over 30 Minutes Intravenous 3 times per day 12/20/12 1741 12/26/12 0859   12/20/12 0900  levofloxacin (LEVAQUIN) IVPB 750 mg     750 mg 100 mL/hr over 90 Minutes  Intravenous Every 24 hours 12/20/12 0848 12/30/12 0859   12/18/12 2100  piperacillin-tazobactam (ZOSYN) IVPB 3.375 g  Status:  Discontinued     3.375 g 12.5 mL/hr over 240 Minutes Intravenous Every 8 hours 12/18/12 1852 12/19/12 1554   12/18/12 2000  vancomycin (VANCOCIN) IVPB 1000 mg/200 mL premix  Status:  Discontinued     1,000 mg 200 mL/hr over 60 Minutes Intravenous Every 8 hours 12/18/12 1852 12/19/12 1554   12/15/12 0800  ceFAZolin (ANCEF) IVPB 1 g/50 mL premix     1 g 100 mL/hr over 30 Minutes Intravenous Every 8 hours 12/15/12 0413 12/15/12 1604      Assessment/Plan: s/p Procedure(s): CRANIECTOMY  HEMATOMA EVACUATION SUBDURAL WITH PLACEMENT OF BONE FLAP IN ABDOMINAL WALL (Left)  Fall  TBI s/p decompressive craniectomy/B anterior circulation infarcts/L posterior circulation infarct -- S/P decompressive craniectomy, Dr. Jeral Fruit following, new sz activity this am with new IPHematoma. Repeat head CT in am  Facial abrasions -- Local care  Resp -- remains stable S/P extubation, good cough  ID -- levaquin for H. Flu, Strep pneumo, and moraxella PNA, D/C cefepime other day; fever last night with rising wbc - will do fever w/u- check blood, urine FEN -- increase free water and change IVF for hypernatremia, tol TF, agitated now. Will hold TF today. Will try to restart in AM VTE -- SCD's  Dispo --questionable now given new event  Mary Sella. Andrey Campanile, MD, FACS General, Bariatric, & Minimally Invasive Surgery [facility], [place]    LOS: 14 days    Atilano Ina 12/28/2012

## 2012-12-28 NOTE — Consult Note (Addendum)
Reason for Consult:Seizure activity Referring Physician: Dr Jeral Fruit   CC: Seizure activity   HPI: Albert [name] is a 37 y.o. male admitted to [facility] on 12/15/2012 by Dr. Jeral Fruit with a large left subdural hematoma. The patient had apparently been drinking with friends and fell while getting out of the car. The patient was noted to be pulseless and unresponsive. He underwent a craniectomy with evacuation of the hematoma performed by Dr. Jeral Fruit on the day of admission.    We have been asked to see the patient today for new onset of seizure activity. The activity apparently started early this morning with right facial twitching. The patient was also noted to have left eye deviation and generalized tonic clonic jerking. The patient received Ativan; however, the activity returned at approximately 9 AM at which time he received more Ativan. A stat CT of the head was obtained that revealed a new left craniectomy bed hematoma with mass effect on the left and a new 6 mm left to right midline shift. Surgery was not felt to be indicated at this time; although, a repeat CT scan has been ordered for the morning. An EEG is pending.   The patient had been on Keppra 500 mg every 12 hours. The dose was increased today to 1000 mg every 12 hours. Dr. Amada Jupiter was contacted and the plan at this time is to add phenytoin if the seizure activity continues.        Past Medical History   Diagnosis  Date   .  Headache         Past Surgical History   Procedure  Laterality  Date   .  Craniotomy  Left  12/15/2012       Procedure: CRANIECTOMY HEMATOMA EVACUATION SUBDURAL WITH PLACEMENT OF BONE FLAP IN ABDOMINAL WALL;  Surgeon: Karn Cassis, MD;  Location: [facility];  Service: Neurosurgery;  Laterality: Left;      History reviewed. No pertinent family history.   Social History: reports that he quit smoking about 10 years ago. His smoking use included Cigarettes. He smoked 0.00 packs per  day. He does not have any smokeless tobacco history on file. He reports that he drinks about 3.6 ounces of alcohol per week. His drug history is not on file.    Allergies   Allergen  Reactions   .  Vancomycin  Rash   .  Zosyn (Piperacillin Sod-Tazobactam So)  Rash      Medications:   Scheduled: .  antiseptic oral rinse   15 mL  Mouth Rinse  QID   .  chlorhexidine   15 mL  Mouth Rinse  BID   .  free water   300 mL  Per Tube  Q6H   .  insulin aspart   0-15 Units  Subcutaneous  Q4H   .  levETIRAcetam   1,000 mg  Per Tube  Q12H   .  levofloxacin (LEVAQUIN) IV   750 mg  Intravenous  Q24H   .  LORazepam   1 mg  Intravenous  Once   .  multivitamin   5 mL  Oral  Daily   .  pantoprazole sodium   40 mg  Per Tube  QHS   .  phenytoin (DILANTIN) IV   1,750 mg  Intravenous  Once   .  propranolol   40 mg  Per Tube  Q8H   .  sodium chloride   10-40 mL  Intracatheter  Q12H  ROS: Unobtainable at this time.   Physical Examination: Blood pressure 142/97, pulse 154, temperature 100.9 F (38.3 C), temperature source Oral, resp. rate 20, height 5\' 11"  (1.803 m), weight 90.4 kg (199 lb 4.7 oz), SpO2 100.00%.   General - 37 year old male in bed somewhat restless. There is a large left craniectomy wound. Heart - Regular rate and rhythm - no murmer Lungs - mild rhonchi Extremities - Distal pulses intact - no edema Skin - cool and dry   Neurologic Examination   Blood pressure 162/92, pulse 90, temperature 98.6 F (37 C), temperature source Oral, resp. rate 18, SpO2 98.00%.   Mental Status:   Patient opens eyes to noxious stimulus. He does not respond to verbal stimuli. He is not follow commands. He makes no attempt to speak. Cranial Nerves:   II: Discs difficult to visualize; pupils 6-7 mm bilaterally and responsive to light. Difficult to assess blink to threat due to active eye closure.   III,IV, VI: ptosis not present, the patient keeps both eyes tightly closed. V,VII: The face appears  symmetric, intact blink to eyelid stimulation Motor:   He has increased tone throughout. He flexes his right arm  He does appear to withdraw to painful stimuli on the left. He keeps both upper extremities flexed and becomes agitated when they are extended.   Deep Tendon Reflexes: 3+ throughout Plantars:   Right: Equivocal Left: Upgoing CV: pulses palpable throughout      Laboratory Studies:    Basic Metabolic Panel: Recent Labs Lab  12/24/12 0540  12/25/12 0420  12/26/12 0500  12/27/12 0520  12/28/12 0905   NA  148*  151*  153*  151*  144   K  4.2  4.0  3.8  3.9  3.7   CL  108  112  115*  112  105   CO2  27  29  27  29  28    GLUCOSE  119*  140*  164*  166*  152*   BUN  32*  37*  34*  33*  33*   CREATININE  0.71  0.80  0.92  0.70  0.70   CALCIUM  10.2  10.1  10.0  10.2  9.6      Liver Function Tests: No results found for this basename: AST, ALT, ALKPHOS, BILITOT, PROT, ALBUMIN,  in the last 168 hours No results found for this basename: LIPASE, AMYLASE,  in the last 168 hours No results found for this basename: AMMONIA,  in the last 168 hours   CBC: Recent Labs Lab  12/24/12 0540  12/25/12 0420  12/26/12 0500  12/28/12 0905   WBC  13.2*  13.2*  14.2*  18.8*   HGB  12.6*  13.3  13.8  13.0   HCT  38.1*  39.9  41.5  38.0*   MCV  86.0  86.9  86.6  85.6   PLT  391  404*  455*  463*      Cardiac Enzymes: No results found for this basename: CKTOTAL, CKMB, CKMBINDEX, TROPONINI,  in the last 168 hours   BNP: No components found with this basename: POCBNP,    CBG: Recent Labs Lab  12/27/12 1607  12/27/12 1951  12/27/12 2346  12/28/12 0422  12/28/12 0747   GLUCAP  141*  138*  130*  115*  149*      Microbiology: Results for orders placed during the hospital encounter of 12/14/12   CULTURE, BLOOD (ROUTINE X 2)  Status: None     Collection Time      12/16/12 10:50 AM       Result  Value  Range  Status     Specimen Description  BLOOD LEFT HAND      Final     Special Requests  BOTTLES DRAWN AEROBIC ONLY 3CC     Final     Culture  Setup Time  12/16/2012 14:21     Final     Culture  NO GROWTH 5 DAYS     Final     Report Status  12/22/2012 FINAL     Final   CULTURE, BLOOD (ROUTINE X 2)     Status: None     Collection Time      12/16/12 10:58 AM       Result  Value  Range  Status     Specimen Description  BLOOD RIGHT ARM     Final     Special Requests  BOTTLES DRAWN AEROBIC AND ANAEROBIC 10CC     Final     Culture  Setup Time  12/16/2012 14:21     Final     Culture  NO GROWTH 5 DAYS     Final     Report Status  12/22/2012 FINAL     Final   URINE CULTURE     Status: None     Collection Time      12/16/12 12:03 PM       Result  Value  Range  Status     Specimen Description  URINE, CATHETERIZED     Final     Special Requests  Normal     Final     Culture  Setup Time  12/16/2012 12:46     Final     Colony Count  NO GROWTH     Final     Culture  NO GROWTH     Final     Report Status  12/17/2012 FINAL     Final   CULTURE, RESPIRATORY (NON-EXPECTORATED)     Status: None     Collection Time      12/16/12  4:07 PM       Result  Value  Range  Status     Specimen Description  TRACHEAL ASPIRATE     Final     Special Requests  NONE     Final     Gram Stain        Final     Value:  ABUNDANT WBC PRESENT, PREDOMINANTLY PMN        RARE SQUAMOUS EPITHELIAL CELLS PRESENT        MODERATE GRAM NEGATIVE COCCI        MODERATE GRAM POSITIVE COCCI        IN PAIRS     Culture        Final     Value:  ABUNDANT STREPTOCOCCUS PNEUMONIAE        ABUNDANT HAEMOPHILUS INFLUENZAE        Note: Beta Lactamase Positive        ABUNDANT MORAXELLA CATARRHALIS(BRANHAMELLA)        Note: BETA LACTAMASE POSITIVE     Report Status  12/20/2012 FINAL     Final     Organism ID, Bacteria  STREPTOCOCCUS PNEUMONIAE     Final      Coagulation Studies: No results found for this basename: LABPROT, INR,  in the last 72 hours   Urinalysis: No results found  for this  basename: COLORURINE, APPERANCEUR, LABSPEC, PHURINE, GLUCOSEU, HGBUR, BILIRUBINUR, KETONESUR, PROTEINUR, UROBILINOGEN, NITRITE, LEUKOCYTESUR,  in the last 168 hours   Lipid Panel:     Component  Value  Date/Time     TRIG  86  12/18/2012 0500      HgbA1C:   No results found for this basename: HGBA1C       Urine Drug Screen:     Component  Value  Date/Time     LABOPIA  NONE DETECTED  12/15/2012 0037     COCAINSCRNUR  NONE DETECTED  12/15/2012 0037     LABBENZ  NONE DETECTED  12/15/2012 0037     AMPHETMU  NONE DETECTED  12/15/2012 0037     THCU  NONE DETECTED  12/15/2012 0037     LABBARB  NONE DETECTED  12/15/2012 0037      Alcohol Level: No results found for this basename: ETH,  in the last 168 hours   Other results: EKG: SR rate 79 BPM   Imaging: Ct Head Wo Contrast 12/28/12 New left craniectomy bed hematoma with mass effect on the left cerebral hemisphere and new 6 mm of left to right midline shift.          Delton See PA-C [facility] Pager [phone] 12/28/2012, 12:39 PM   I have seen and evaluated the patient. I have reviewed the above note and made appropriate changes.    Assessment/Plan: 37 yo M with left SDH and new seizures. His blood collection is slightly concerning in that it can sometimes make seizures difficult to contorl. He ha srecieved ativan and keppra and is currently getting a phenytoin load. If seizures remain problematic, drainage might be appropriate.    1) Complete phenytoin load 2) Continue keppra at 1.5 gm BID.   3) Dilantin 100mg  IV TID 4) If continued seizures, would with vimpat 200mg  IV x 1, then 100mg  BID 5) repeat EEG tomorrow   This patient is critically ill and at significant risk of neurological worsening, death and care requires constant monitoring of vital signs, hemodynamics,respiratory and cardiac monitoring, neurological assessment, discussion with family, other specialists and medical decision making of high  complexity. I spent 45 minutes of neurocritical care time in the care of  this patient.   Ritta Slot, MD [facility] [phone]   If 7pm- 7am, please page neurology on call at [phone].   12/28/2012  4:10 PM

## 2012-12-28 NOTE — Progress Notes (Signed)
Patient is posturing. MD aware. Stat CT ordered. Vitals and airway WNL (See doc flow sheet). Patient transported to CT with RN Kathlene November and Eduard Clos present.

## 2012-12-28 NOTE — Progress Notes (Addendum)
Patient sent to OR for procedure. Vitals stable.

## 2012-12-28 NOTE — Progress Notes (Signed)
Patient family member(mother) requested 4 siderails due to safety. Patient moves arms and legs nonpurposefully. Mother is afraid that patient may fall out of bed with all of the moving about he does while in bed. Trauma MD made aware and gave new order for 4 siderails. Restraint assessment done as per policy

## 2012-12-28 NOTE — Brief Op Note (Signed)
12/14/2012 - 12/28/2012  7:23 PM  PATIENT:  Albert [name]  37 y.o. male  PRE-OPERATIVE DIAGNOSIS:  intercerebral hemorrhage with status epilepticus  POST-OPERATIVE DIAGNOSIS:  intercerebral hemorrhage with status epilepticus PROCEDURE:  Procedure(s): CRANIOTOMY HEMATOMA EVACUATION SUBDURAL (Left) with revision of bone flap placement in abdomen  SURGEON:  Surgeon(s) and Role:    * Keianna Signer, MD - Primary  PHYSICIAN ASSISTANT:   ASSISTANTS: none   ANESTHESIA:   general  EBL:     BLOOD ADMINISTERED:none  DRAINS: (10) Jackson-Pratt drain(s) with closed bulb suction in the epidural space   LOCAL MEDICATIONS USED:  MARCAINE     SPECIMEN:  Excision  DISPOSITION OF SPECIMEN:  PATHOLOGY  COUNTS:  YES  TOURNIQUET:  * No tourniquets in log *  DICTATION: DICTATION: Patient is 37 year old man who fell and struck his head.  He had a craniotomy and evacuation of SDH with placement of bone flap in left side of abdomen.  He started seizing today and we were unable to stop this despite ativan and two AED's.  Neurology asked me to take patient to OR to remove new ICH/SDH as CT shows left frontal intracerebral hematoma with subdural hematoma with increasing shift and mass effect.  It was elected to take patient to surgery for redo craniotomy for ICH and SDH.  Procedure:  Following smooth intubation, patient was placed in right semi-lateral position with blanket roll.  Head was placed on donut head holder and left frontal scalp was shaved and prepped and draped in usual sterile fashion. His abdominal wall was also shaved, prepped and draped with betadine scrub and Duraprep.  Area of planned incision was infiltrated with lidocaine over abdomen. Prior incision was reopened and carried through temporalis fascia and muscle to expose the hematoma and brain.  Dura was elevated exposing subdural hematoma.  Dura was opened and subdural was evacuated. There was a large amount of what appeared to be  organized clot and fibrin and this was sent to Pathology.   Hemostasis was assured.  The brain was considerably more relaxed after hematoma evacuation.  The left upper quadrant incision was reopened and the bone flap was removed.  A new incision was made in the right upper quadrant and a new subcutaneous pocket was created.  The bone flap was inserted on the right side, so that later, a PEG tube could be placed on the left. The abdominal incisions were closed with 2-0 and 3-0 vicryl sutures and staples and a sterile occlusive dressing was placed. The dura was closed with 4-0 neurilon sutures and a dural patch graft was placed,  the fascia and galea were closed with 2-0 vicryl sutures and the skin was re approximated with staples.  A sterile occlusive dressing was placed.  Patient was returned to a supine position and moved to the gurney and transferred to the ICU for postoperative recovery, having tolerated his surgery well. Counts were correct at the conclusion of the surgery.   PLAN OF CARE: Admit to inpatient   PATIENT DISPOSITION:  PACU - hemodynamically stable.   Delay start of Pharmacological VTE agent (>24hrs) due to surgical blood loss or risk of bleeding: yes  

## 2012-12-28 NOTE — Anesthesia Preprocedure Evaluation (Addendum)
Anesthesia Evaluation  Patient identified by MRN, date of birth, ID band Patient unresponsive    Reviewed: Unable to perform ROS - Chart review only  History of Anesthesia Complications (+) AWARENESS UNDER ANESTHESIA  Airway Mallampati: I TM Distance: >3 FB Neck ROM: full    Dental  (+) Teeth Intact   Pulmonary  + rhonchi         Cardiovascular Exercise Tolerance: Good Rhythm:regular Rate:Normal  12-15-12 ECHO  ------------------------------------------------------------ Study Conclusions  - Left ventricle: The cavity size was normal. There was mild   concentric hypertrophy. Systolic function was normal. Wall   motion was normal; there were no regional wall motion   abnormalities. - Right ventricle: The cavity size was mildly dilated. Wall   thickness was normal. Transthoracic echocardiography.  M-mode, complete 2D, spectral Doppler, and color Doppler.  Height:  Height: 180.3cm. Height: 71in.  Weight:  Weight: 98.1kg. Weight: 215.8lb.  Body mass index:  BMI: 30.2kg/m^2.  Body surface area:    BSA: 2.16m^2.  Blood pressure:     131/79.  Patient status:  Inpatient.  Location:  ICU/CCU     Neuro/Psych  Headaches, Anxiety    GI/Hepatic negative GI ROS, Neg liver ROS,   Endo/Other  negative endocrine ROS  Renal/GU negative Renal ROS     Musculoskeletal negative musculoskeletal ROS (+)   Abdominal   Peds  Hematology negative hematology ROS (+)   Anesthesia Other Findings Hx Etoh abuse/ blunt head trauma/Subdural hemorrage/Crainotomy/ Ongoing seizure activity.  Reproductive/Obstetrics                      Anesthesia Physical Anesthesia Plan  ASA: IV and emergent  Anesthesia Plan: General   Post-op Pain Management:    Induction: Intravenous  Airway Management Planned: Oral ETT  Additional Equipment:   Intra-op Plan:   Post-operative Plan: Post-operative  intubation/ventilation  Informed Consent:   Plan Discussed with: Anesthesiologist, CRNA and Surgeon  Anesthesia Plan Comments:        Anesthesia Quick Evaluation

## 2012-12-28 NOTE — Progress Notes (Signed)
Patient ID: Albert [name], male   DOB: May 06, 1976, 37 y.o.   MRN: [id number] 13 Days Post-Op  Subjective: ?seizure like activity per nursing, facial twitching and jerking, moving left side, right side flaccid, facial symptoms stopped now, ativan given  Objective: Vital signs in last 24 hours: Temp:  [98.6 F (37 C)-101 F (38.3 C)] 101 F (38.3 C) (04/19 0749) Pulse Rate:  [78-107] 80 (04/19 0749) Resp:  [18-22] 20 (04/19 0600) BP: (138-155)/(76-107) 138/88 mmHg (04/19 0749) SpO2:  [92 %-100 %] 92 % (04/19 0749) Weight:  [199 lb 4.7 oz (90.4 kg)] 199 lb 4.7 oz (90.4 kg) (04/19 0500) Last BM Date: 12/25/12  Intake/Output from previous day: 04/18 0701 - 04/19 0700 In: 0  Out: 2001 [Urine:2000; Stool:1] Intake/Output this shift: Total I/O In: 10 [I.V.:10] Out: -   Physical exam: General: NAD Ext: non-purposeful movement of left side, no movement on right, does not respond to painful stimuli Neuro: pupils are constricted but do respond to light although right is much slower to respond, does not respond to name and does not open eyes.  At current now seizure like activity   Lab Results:   Recent Labs  12/26/12 0500  WBC 14.2*  HGB 13.8  HCT 41.5  PLT 455*   BMET  Recent Labs  12/26/12 0500 12/27/12 0520  NA 153* 151*  K 3.8 3.9  CL 115* 112  CO2 27 29  GLUCOSE 164* 166*  BUN 34* 33*  CREATININE 0.92 0.70  CALCIUM 10.0 10.2   PT/INR No results found for this basename: LABPROT, INR,  in the last 72 hours ABG No results found for this basename: PHART, PCO2, PO2, HCO3,  in the last 72 hours  Studies/Results: No results found.  Anti-infectives: Anti-infectives   Start     Dose/Rate Route Frequency Ordered Stop   12/20/12 1745  ceFEPIme (MAXIPIME) 1 g in dextrose 5 % 50 mL IVPB  Status:  Discontinued     1 g 100 mL/hr over 30 Minutes Intravenous 3 times per day 12/20/12 1741 12/26/12 0859   12/20/12 0900  levofloxacin (LEVAQUIN) IVPB 750 mg     750  mg 100 mL/hr over 90 Minutes Intravenous Every 24 hours 12/20/12 0848 12/30/12 0859   12/18/12 2100  piperacillin-tazobactam (ZOSYN) IVPB 3.375 g  Status:  Discontinued     3.375 g 12.5 mL/hr over 240 Minutes Intravenous Every 8 hours 12/18/12 1852 12/19/12 1554   12/18/12 2000  vancomycin (VANCOCIN) IVPB 1000 mg/200 mL premix  Status:  Discontinued     1,000 mg 200 mL/hr over 60 Minutes Intravenous Every 8 hours 12/18/12 1852 12/19/12 1554   12/15/12 0800  ceFAZolin (ANCEF) IVPB 1 g/50 mL premix     1 g 100 mL/hr over 30 Minutes Intravenous Every 8 hours 12/15/12 0413 12/15/12 1604      Assessment/Plan: s/p Procedure(s): CRANIECTOMY HEMATOMA EVACUATION SUBDURAL WITH PLACEMENT OF BONE FLAP IN ABDOMINAL WALL (Left)  New seizure like activity this am, ativan was given, Neurosurgery needs to see to evaluate for med changes/?reassement  Continue tube feeds, iv antibiotics, PT  LOS: 14 days    Judd Mccubbin 12/28/2012

## 2012-12-28 NOTE — Progress Notes (Signed)
Patient ID: Albert [name], male   DOB: 04-12-1976, 37 y.o.   MRN: [id number] Patient has been neurologically his baseline. However he said to seizures and characterized by left eye deviation and generalized tonoclonic jerking. The initial seizure responded and a half milligram Ativan at 750 as morning currently is having another one at 9:00 will be given him in a milligram of Ativan and ordering a stat head CT. His flap seems to be mildly more tense.

## 2012-12-28 NOTE — Progress Notes (Signed)
Transported  Via bed with equipment after return from CT scan and report was called to 3100 RN Kathlene November; two episodes of facial twitching this am at 0750 and 0855 witnessed and reported to Trauma MD and Neurosurgery. VSS, O2 at 2l was applied for decreased Sat. At 90% roomair. Transported without distress. Wife was notified of the events and room transfer to ICU.

## 2012-12-28 NOTE — Transfer of Care (Signed)
Immediate Anesthesia Transfer of Care Note  Patient: [facility]  Procedure(s) Performed: Procedure(s): CRANIOTOMY HEMATOMA EVACUATION SUBDURAL (Left)  Patient Location: ICU  Anesthesia Type:General  Level of Consciousness: sedated and unresponsive  Airway & Oxygen Therapy: Patient remains intubated per anesthesia plan  Post-op Assessment: Post -op Vital signs reviewed and stable  Post vital signs: Reviewed and stable  Complications: No apparent anesthesia complications

## 2012-12-28 NOTE — Progress Notes (Addendum)
  Patient has developed extensor posturing on the left, I discussed with Dr. Venetia Maxon, getting stat head CT.

## 2012-12-28 NOTE — Progress Notes (Signed)
Sputum sample done for culture order.  No complications noted.

## 2012-12-28 NOTE — Progress Notes (Signed)
Patient continuing to have seizures and starting to have extensor posturing on the left.  Repeat Head Ct shows slight increase in blood over left convexity.  Per Dr. Amada Jupiter, his concern is that seizures are proving difficult to control despite two AED's and and he has asked me to remove hematoma, hoping that this will allow [place] to be able to better control seizures.  I will do so and will also reposition bone flap over right side of abdomen so as to provide access for G tube placement on the left side of his abdomen at a later date.  We will keep patient intubated after his surgery to make sure he is stable and seizure activity controlled.

## 2012-12-28 NOTE — Anesthesia Postprocedure Evaluation (Signed)
  Anesthesia Post-op Note  Patient: Albert [name]  Procedure(s) Performed: Procedure(s): CRANIOTOMY HEMATOMA EVACUATION SUBDURAL (Left)  Patient Location: NICU  Anesthesia Type:General  Level of Consciousness: sedated and Patient remains intubated per anesthesia plan  Airway and Oxygen Therapy: Patient remains intubated per anesthesia plan and Patient placed on Ventilator (see vital sign flow sheet for setting)  Post-op Pain: none  Post-op Assessment: Post-op Vital signs reviewed, Patient's Cardiovascular Status Stable, Respiratory Function Stable, Patent Airway, No signs of Nausea or vomiting and Pain level controlled  Post-op Vital Signs: stable  Complications: No apparent anesthesia complications

## 2012-12-28 NOTE — Preoperative (Signed)
Beta Blockers   Reason not to administer Beta Blockers:Not Applicable 

## 2012-12-29 ENCOUNTER — Inpatient Hospital Stay (HOSPITAL_COMMUNITY): Payer: BC Managed Care – PPO

## 2012-12-29 LAB — BASIC METABOLIC PANEL
BUN: 37 mg/dL — ABNORMAL HIGH (ref 6–23)
CO2: 26 mEq/L (ref 19–32)
Calcium: 9.1 mg/dL (ref 8.4–10.5)
Chloride: 110 mEq/L (ref 96–112)
Creatinine, Ser: 1 mg/dL (ref 0.50–1.35)
GFR calc Af Amer: >90 mL/min (ref >90)
GFR calc non Af Amer: >90 mL/min (ref >90)
Glucose, Bld: 124 mg/dL — ABNORMAL HIGH (ref 70–99)
Potassium: 3.9 mEq/L (ref 3.5–5.1)
Sodium: 146 mEq/L — ABNORMAL HIGH (ref 135–145)

## 2012-12-29 LAB — GLUCOSE, CAPILLARY
Glucose-Capillary: 107 mg/dL — ABNORMAL HIGH (ref 70–99)
Glucose-Capillary: 115 mg/dL — ABNORMAL HIGH (ref 70–99)
Glucose-Capillary: 118 mg/dL — ABNORMAL HIGH (ref 70–99)
Glucose-Capillary: 120 mg/dL — ABNORMAL HIGH (ref 70–99)
Glucose-Capillary: 129 mg/dL — ABNORMAL HIGH (ref 70–99)
Glucose-Capillary: 94 mg/dL (ref 70–99)

## 2012-12-29 LAB — CBC WITH DIFFERENTIAL/PLATELET
Basophils Absolute: 0.1 10*3/uL (ref 0.0–0.1)
Basophils Relative: 1 % (ref 0–1)
Eosinophils Absolute: 0.2 10*3/uL (ref 0.0–0.7)
Eosinophils Relative: 2 % (ref 0–5)
HCT: 32.5 % — ABNORMAL LOW (ref 39.0–52.0)
Hemoglobin: 11.1 g/dL — ABNORMAL LOW (ref 13.0–17.0)
Lymphocytes Relative: 20 % (ref 12–46)
Lymphs Abs: 2.3 10*3/uL (ref 0.7–4.0)
MCH: 28.7 pg (ref 26.0–34.0)
MCHC: 34.2 g/dL (ref 30.0–36.0)
MCV: 84 fL (ref 78.0–100.0)
Monocytes Absolute: 1.2 10*3/uL — ABNORMAL HIGH (ref 0.1–1.0)
Monocytes Relative: 11 % (ref 3–12)
Neutro Abs: 7.8 10*3/uL — ABNORMAL HIGH (ref 1.7–7.7)
Neutrophils Relative %: 67 % (ref 43–77)
Platelets: 364 10*3/uL (ref 150–400)
RBC: 3.87 MIL/uL — ABNORMAL LOW (ref 4.22–5.81)
RDW: 12.6 % (ref 11.5–15.5)
WBC: 11.5 10*3/uL — ABNORMAL HIGH (ref 4.0–10.5)

## 2012-12-29 LAB — URINE CULTURE
Colony Count: NO GROWTH
Culture: NO GROWTH

## 2012-12-29 LAB — PHENYTOIN LEVEL, TOTAL: Phenytoin Lvl: 15.4 ug/mL (ref 10.0–20.0)

## 2012-12-29 NOTE — Progress Notes (Signed)
Pt began having intermittent seizures shortly after 2100 that were characterized by full body twitching/shaking appearing more concentrated on the left side of his body than the right.  The seizures lasted approximately 10-20 seconds each.  MD(Stern) notified and suggested giving prn ativan but if pt continued having seizures to notify neurology.  Pt did  receive his scheduled doses of vimpat and keppra tonight.   Pt was given ativan at 2110 and versed at 2120 but pt continued to have seizure activity intermittently.  MD Roseanne Reno and Andrey Campanile) notified of the above events and orders  were given for pt to receive an additional dose of keppra and to increase his scheduled doses of keppra to 1500 mg.  Will continue to monitor.

## 2012-12-29 NOTE — Progress Notes (Addendum)
Subjective: 2 seizures overnight.   Exam: Filed Vitals:   12/29/12 0830  BP: 118/80  Pulse: 105  Temp:   Resp: 16   Gen: In bed, NAD MS: does not open eyes, does not follow commands ZO:XWRUE, does not fixate or track Motor: to nox stim, flexion vs withdrawal LUE, flexion RUE, no movement RLE, minimal withdrawal lle.  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 He continues to have seizures and is unresponsive. He did have diffusion changes on his previous MRI, though post-ictal changes can have this appearance, so can injury from mass effect causing ischemia.   These type of seizures can be extremely difficult to control.   Recommendations: 1) EEG to rule out nonconvulsive status.  2) Continue vimpat 150mg  BID 3) Continue dilantin 300mg /day div TID 4) check dilantin level this morning.  5) continue keppra 1500mg  BID 6) If further seizure activity, will start topamax at high dose.   This patient is critically ill and at significant risk of neurological worsening, death and care requires constant monitoring of vital signs, hemodynamics,respiratory and cardiac monitoring, neurological assessment, discussion with family, other specialists and medical decision making of high complexity. I spent 35 minutes of neurocritical care time  in the care of  this patient.  Ritta Slot, MD [facility] [phone]  If 7pm- 7am, please page neurology on call at [phone].  12/29/2012  9:03 AM

## 2012-12-29 NOTE — Progress Notes (Signed)
Pt transported to and from CT without any complications.  

## 2012-12-29 NOTE — Progress Notes (Signed)
1 Day Post-Op  Subjective: On vent, sedated, hemodynamically stable  Objective: Vital signs in last 24 hours: Temp:  [98.3 F (36.8 C)-100 F (37.8 C)] 100 F (37.8 C) (04/20 0759) Pulse Rate:  [72-105] 99 (04/20 1030) Resp:  [11-28] 16 (04/20 1030) BP: (110-157)/(72-104) 124/86 mmHg (04/20 1030) SpO2:  [92 %-100 %] 100 % (04/20 1030) FiO2 (%):  [40 %] 40 % (04/20 0745) Weight:  [191 lb 12.8 oz (87 kg)] 191 lb 12.8 oz (87 kg) (04/20 0400) Last BM Date: 12/27/12  Intake/Output from previous day: 04/19 0701 - 04/20 0700 In: 1541.4 [I.V.:846.4; NG/GT:20; IV Piggyback:675] Out: 1055 [Urine:860; Drains:45; Blood:150] Intake/Output this shift: Total I/O In: 237.8 [I.V.:237.8] Out: 150 [Urine:150]  Intubated Lungs clear CV tachy, reg rhythm abd soft  Lab Results:   Recent Labs  12/28/12 0905 12/29/12 0438  WBC 18.8* 11.5*  HGB 13.0 11.1*  HCT 38.0* 32.5*  PLT 463* 364   BMET  Recent Labs  12/28/12 0905 12/29/12 0438  NA 144 146*  K 3.7 3.9  CL 105 110  CO2 28 26  GLUCOSE 152* 124*  BUN 33* 37*  CREATININE 0.70 1.00  CALCIUM 9.6 9.1   PT/INR No results found for this basename: LABPROT, INR,  in the last 72 hours ABG No results found for this basename: PHART, PCO2, PO2, HCO3,  in the last 72 hours  Studies/Results: Ct Head Wo Contrast  12/29/2012  *RADIOLOGY REPORT*  Clinical Data: Fall, follow-up hematoma following craniotomy  CT HEAD WITHOUT CONTRAST  Technique:  Contiguous axial images were obtained from the base of the skull through the vertex without contrast.  Comparison: 12/28/2012  Findings: Postsurgical changes overlying the left frontoparietal region.  Interval evacuation of overlying left extra-axial hemorrhage, improved, with placement of a surgical drain. Gas/stranding/hemorrhage in the left frontal scalp with overlying skin staples.  Small amount of parafalcine subdural hemorrhage which layers along the left tentorium, unchanged.  Associated 6 mm  rightward midline shift (series 2/image 16), previously 8 mm.  Basal cisterns remain patent.  Bilateral temporal horns are mildly dilated.  Otherwise, no ventriculomegaly.  No evidence of interventricular hemorrhage.  Stable hypodensity in the ventral aspect of the left thalamus (series 2/image 16).  The visualized paranasal sinuses are essentially clear. The mastoid air cells are unopacified.  IMPRESSION: Postsurgical changes with interval evacuation of the left extra- axial collection with placement of a surgical drain.  6 mm rightward midline shift, decreased.  Basal cisterns remain patent.  Temporal horns of the lateral ventricles are mildly dilated.  Small parafalcine subdural hemorrhage which layers along the left tentorium, unchanged.  Stable hypodensity in the ventral aspect of the left thalamus.   Original Report Authenticated By: Charline Bills, M.D.    Ct Head Wo Contrast  12/28/2012  *RADIOLOGY REPORT*  Clinical Data: 37 year old male with extensor posturing.  Severe head trauma.  Cardiac arrest.  Status post craniectomy on the left.  CT HEAD WITHOUT CONTRAST  Technique:  Contiguous axial images were obtained from the base of the skull through the vertex without contrast.  Comparison: 0955 hours the same day and earlier.  Findings: Stable paranasal sinuses and mastoids.  Stable orbit soft tissues.  Sequelae of left lateral craniectomy. Stable visualized osseous structures.  Hyperdense and tubular hemorrhage along the surface of the brain at the left craniectomy site has not significantly changed in size or configuration allowing for mildly different slice angulation. Parafalcine subdural hemorrhage in the brain re-identified and new since 12/24/2012.  This  layers along the left tentorium as seen earlier today.  Other left scalp soft tissue swelling and fluid/blood products are stable.  Rightward midline shift of the brain measures up to 8 mm (6-7 mm earlier today).  No ventriculomegaly or  intraventricular hemorrhage.  No new areas of intracranial hemorrhage.  Basilar cisterns remain patent.  Trace posterior and right subdural blood on the recent MRI is not visible by CT.  There are subtle gyriform hyperdense changes in the medial left frontal and parietal lobe.  There were some diffusion changes recently in this area.  No areas of cytotoxic edema identified in the brain to correspond with the recent MRI diffusion findings.  However, there is evidence of increased ventral left thalamus hypodensity compared to 12/24/2012 and earlier.  IMPRESSION: 1. Slightly increased rightward midline shift from this morning, now up to 8 mm (previously 6-7 mm). Basilar cisterns remain patent. 2.  No significant change in left extra-axial hemorrhage which has developed since the 12/24/2012 MRI. 3.  Hypodensity in the ventral left thalamus new since 12/24/2012. Question new gyriform hyperdensity in the medial left posterior frontal and parietal lobes. It is possible that these changes as well as some of the diffusion changes seen more recently are post ictal?  Other diffusion changes at that time remain occult on CT.  Salient findings discussed with Dr. Kerri Perches by telephone at the time of dictation.   Original Report Authenticated By: Erskine Speed, M.D.    Ct Head Wo Contrast  12/28/2012  *RADIOLOGY REPORT*  Clinical Data: Seizures this morning.  Left eye deviation and tonic clonic contractions.  CT HEAD WITHOUT CONTRAST  Technique:  Contiguous axial images were obtained from the base of the skull through the vertex without contrast.  Comparison: MRI 12/25/2010.  CT 12/21/2012.  Findings: Left craniectomy is present.  Since the prior CT and MRI, there has been hemorrhage into the craniectomy defect.  The craniectomy involves the frontal, parietal and temporal bones.  There is a hematoma in the craniectomy bed measuring 7 cm AP, 24 mm transverse.  This is extensive in the cranial caudal direction, extending the full  length of the craniectomy defect (7 cm).  There is hematocrit in this hematoma, with low attenuation fluid more anteriorly blends with the fat along the scalp.  Mass effect on the left cerebral hemisphere is mild as of subarachnoid blood.  There is however, left to right midline shift is 6 mm which is new compared to the prior CT and MRI.  No intraventricular blood is identified.  Subdural blood is present along the falx and left tentorium cerebelli.  Motion artifact is present on the examination.  Paranasal sinus disease appears similar.  IMPRESSION:  1.  New left craniectomy bed hematoma with mass effect on the left cerebral hemisphere and new 6 mm of left to right midline shift. 2.  These results will be called to the ordering clinician or representative by the Radiologist Assistant, and communication documented in the PACS Dashboard.   Original Report Authenticated By: Andreas Newport, M.D.    Dg Chest Port 1 View  12/28/2012  *RADIOLOGY REPORT*  Clinical Data: 37 year old male endotracheal tube placement.  PORTABLE CHEST - 1 VIEW  Comparison: 12/22/2012 and earlier.  Findings: Semi upright AP portable view 2004 hours.  Endotracheal tube tip at the level of clavicles.  Feeding tube courses to the abdomen, tip at the level of the gastric body.  Left PICC line appears stable.  Larger lung volumes.  Cardiac  size and mediastinal contours are within normal limits.  Allowing for portable technique, the lungs are clear.  IMPRESSION: 1.  Endotracheal tube tip in good position at the level of clavicles. 2.  Other lines and tubes as above. 3. No acute cardiopulmonary abnormality.   Original Report Authenticated By: Erskine Speed, M.D.     Anti-infectives: Anti-infectives   Start     Dose/Rate Route Frequency Ordered Stop   12/28/12 2200  ceFAZolin (ANCEF) IVPB 1 g/50 mL premix     1 g 100 mL/hr over 30 Minutes Intravenous 3 times per day 12/28/12 2003 12/29/12 2159   12/28/12 1646  ceFAZolin (ANCEF) 2-3 GM-% IVPB  SOLR    Comments:  DAY, DORY: cabinet override      12/28/12 1646 12/29/12 0459   12/20/12 1745  ceFEPIme (MAXIPIME) 1 g in dextrose 5 % 50 mL IVPB  Status:  Discontinued     1 g 100 mL/hr over 30 Minutes Intravenous 3 times per day 12/20/12 1741 12/26/12 0859   12/20/12 0900  [MAR Hold]  levofloxacin (LEVAQUIN) IVPB 750 mg  Status:  Discontinued     (On MAR Hold since 12/28/12 1705)   750 mg 100 mL/hr over 90 Minutes Intravenous Every 24 hours 12/20/12 0848 12/28/12 1802   12/18/12 2100  piperacillin-tazobactam (ZOSYN) IVPB 3.375 g  Status:  Discontinued     3.375 g 12.5 mL/hr over 240 Minutes Intravenous Every 8 hours 12/18/12 1852 12/19/12 1554   12/18/12 2000  vancomycin (VANCOCIN) IVPB 1000 mg/200 mL premix  Status:  Discontinued     1,000 mg 200 mL/hr over 60 Minutes Intravenous Every 8 hours 12/18/12 1852 12/19/12 1554   12/15/12 0800  ceFAZolin (ANCEF) IVPB 1 g/50 mL premix     1 g 100 mL/hr over 30 Minutes Intravenous Every 8 hours 12/15/12 0413 12/15/12 1604      Assessment/Plan: s/p Procedure(s): CRANIOTOMY HEMATOMA EVACUATION SUBDURAL (Left)  Continuing supportive care and vent management  LOS: 15 days    Quadasia Newsham A 12/29/2012

## 2012-12-29 NOTE — Procedures (Signed)
History: 37 yo M with Left SDH and seizures  Background: This EEG was recorded in sleep. There are sleep spindles seen which are more visible on the right than left. There is irregular delta activity more prominent on the left. There is a single left frontotemporal (F7 > F3) sharp wave seen.    Photic stimulation: Physiologic driving is not performed  EEG Abnormalities: 1) Left frontotemporal sharp wave 2) irregular delta on the left 3) decreased sleep spindles on the left  Clinical Interpretation: This EEG is consistent with a left sided cerebral dysfunction with potential for epileptogenicitiy in the left frontotemporal region.  No seizure was recorded. Overall, this study is much improved compared to the study of 12/28/12.  Ritta Slot, MD [facility] [phone]  If 7pm- 7am, please page neurology on call at [phone].

## 2012-12-29 NOTE — Progress Notes (Signed)
Subjective: Patient reports sedated, on vent.  Objective: Vital signs in last 24 hours: Temp:  [98.3 F (36.8 C)-101 F (38.3 C)] 98.4 F (36.9 C) (04/20 0416) Pulse Rate:  [72-154] 101 (04/20 0700) Resp:  [11-28] 16 (04/20 0700) BP: (110-157)/(72-104) 118/80 mmHg (04/20 0700) SpO2:  [90 %-100 %] 100 % (04/20 0700) FiO2 (%):  [40 %] 40 % (04/20 0700) Weight:  [87 kg (191 lb 12.8 oz)] 87 kg (191 lb 12.8 oz) (04/20 0400)  Intake/Output from previous day: 04/19 0701 - 04/20 0700 In: 1541.4 [I.V.:846.4; NG/GT:20; IV Piggyback:675] Out: 1055 [Urine:860; Drains:45; Blood:150] Intake/Output this shift:    Physical Exam: Sedated, unresponsive.  Pupils reactive.  Withdraws all extremities to pain.  Seizure activity last night with some abnormal flexion this AM.  No overt seizures this AM.  Flap flat/sunken, dressing CDI.  Lab Results:  Recent Labs  12/28/12 0905 12/29/12 0438  WBC 18.8* 11.5*  HGB 13.0 11.1*  HCT 38.0* 32.5*  PLT 463* 364   BMET  Recent Labs  12/28/12 0905 12/29/12 0438  NA 144 146*  K 3.7 3.9  CL 105 110  CO2 28 26  GLUCOSE 152* 124*  BUN 33* 37*  CREATININE 0.70 1.00  CALCIUM 9.6 9.1    Studies/Results: Ct Head Wo Contrast  12/28/2012  *RADIOLOGY REPORT*  Clinical Data: 37 year old male with extensor posturing.  Severe head trauma.  Cardiac arrest.  Status post craniectomy on the left.  CT HEAD WITHOUT CONTRAST  Technique:  Contiguous axial images were obtained from the base of the skull through the vertex without contrast.  Comparison: 0955 hours the same day and earlier.  Findings: Stable paranasal sinuses and mastoids.  Stable orbit soft tissues.  Sequelae of left lateral craniectomy. Stable visualized osseous structures.  Hyperdense and tubular hemorrhage along the surface of the brain at the left craniectomy site has not significantly changed in size or configuration allowing for mildly different slice angulation. Parafalcine subdural hemorrhage  in the brain re-identified and new since 12/24/2012.  This layers along the left tentorium as seen earlier today.  Other left scalp soft tissue swelling and fluid/blood products are stable.  Rightward midline shift of the brain measures up to 8 mm (6-7 mm earlier today).  No ventriculomegaly or intraventricular hemorrhage.  No new areas of intracranial hemorrhage.  Basilar cisterns remain patent.  Trace posterior and right subdural blood on the recent MRI is not visible by CT.  There are subtle gyriform hyperdense changes in the medial left frontal and parietal lobe.  There were some diffusion changes recently in this area.  No areas of cytotoxic edema identified in the brain to correspond with the recent MRI diffusion findings.  However, there is evidence of increased ventral left thalamus hypodensity compared to 12/24/2012 and earlier.  IMPRESSION: 1. Slightly increased rightward midline shift from this morning, now up to 8 mm (previously 6-7 mm). Basilar cisterns remain patent. 2.  No significant change in left extra-axial hemorrhage which has developed since the 12/24/2012 MRI. 3.  Hypodensity in the ventral left thalamus new since 12/24/2012. Question new gyriform hyperdensity in the medial left posterior frontal and parietal lobes. It is possible that these changes as well as some of the diffusion changes seen more recently are post ictal?  Other diffusion changes at that time remain occult on CT.  Salient findings discussed with Dr. Kerri Perches by telephone at the time of dictation.   Original Report Authenticated By: Erskine Speed, M.D.  Ct Head Wo Contrast  12/28/2012  *RADIOLOGY REPORT*  Clinical Data: Seizures this morning.  Left eye deviation and tonic clonic contractions.  CT HEAD WITHOUT CONTRAST  Technique:  Contiguous axial images were obtained from the base of the skull through the vertex without contrast.  Comparison: MRI 12/25/2010.  CT 12/21/2012.  Findings: Left craniectomy is present.  Since  the prior CT and MRI, there has been hemorrhage into the craniectomy defect.  The craniectomy involves the frontal, parietal and temporal bones.  There is a hematoma in the craniectomy bed measuring 7 cm AP, 24 mm transverse.  This is extensive in the cranial caudal direction, extending the full length of the craniectomy defect (7 cm).  There is hematocrit in this hematoma, with low attenuation fluid more anteriorly blends with the fat along the scalp.  Mass effect on the left cerebral hemisphere is mild as of subarachnoid blood.  There is however, left to right midline shift is 6 mm which is new compared to the prior CT and MRI.  No intraventricular blood is identified.  Subdural blood is present along the falx and left tentorium cerebelli.  Motion artifact is present on the examination.  Paranasal sinus disease appears similar.  IMPRESSION:  1.  New left craniectomy bed hematoma with mass effect on the left cerebral hemisphere and new 6 mm of left to right midline shift. 2.  These results will be called to the ordering clinician or representative by the Radiologist Assistant, and communication documented in the PACS Dashboard.   Original Report Authenticated By: Andreas Newport, M.D.    Dg Chest Port 1 View  12/28/2012  *RADIOLOGY REPORT*  Clinical Data: 37 year old male endotracheal tube placement.  PORTABLE CHEST - 1 VIEW  Comparison: 12/22/2012 and earlier.  Findings: Semi upright AP portable view 2004 hours.  Endotracheal tube tip at the level of clavicles.  Feeding tube courses to the abdomen, tip at the level of the gastric body.  Left PICC line appears stable.  Larger lung volumes.  Cardiac size and mediastinal contours are within normal limits.  Allowing for portable technique, the lungs are clear.  IMPRESSION: 1.  Endotracheal tube tip in good position at the level of clavicles. 2.  Other lines and tubes as above. 3. No acute cardiopulmonary abnormality.   Original Report Authenticated By: Erskine Speed,  M.D.     Assessment/Plan: Head CT improved, no significant reaccumulation of blood. Still trying to control seizures.  On Keppra 1500 mg BID, phenytoin and Vimpat. Continue full support, EEG pending.    LOS: 15 days    Dorian Heckle, MD 12/29/2012, 7:45 AM

## 2012-12-29 NOTE — Progress Notes (Signed)
Pt. Has been quiet, vital signs stable.

## 2012-12-30 ENCOUNTER — Inpatient Hospital Stay (HOSPITAL_COMMUNITY): Payer: BC Managed Care – PPO

## 2012-12-30 DIAGNOSIS — D62 Acute posthemorrhagic anemia: Secondary | ICD-10-CM | POA: Diagnosis not present

## 2012-12-30 LAB — CBC
HCT: 26.4 % — ABNORMAL LOW (ref 39.0–52.0)
Hemoglobin: 9.1 g/dL — ABNORMAL LOW (ref 13.0–17.0)
MCH: 29.1 pg (ref 26.0–34.0)
MCHC: 34.5 g/dL (ref 30.0–36.0)
MCV: 84.3 fL (ref 78.0–100.0)
Platelets: 262 10*3/uL (ref 150–400)
RBC: 3.13 MIL/uL — ABNORMAL LOW (ref 4.22–5.81)
RDW: 12.6 % (ref 11.5–15.5)
WBC: 11.8 10*3/uL — ABNORMAL HIGH (ref 4.0–10.5)

## 2012-12-30 LAB — GLUCOSE, CAPILLARY
Glucose-Capillary: 104 mg/dL — ABNORMAL HIGH (ref 70–99)
Glucose-Capillary: 105 mg/dL — ABNORMAL HIGH (ref 70–99)
Glucose-Capillary: 121 mg/dL — ABNORMAL HIGH (ref 70–99)
Glucose-Capillary: 122 mg/dL — ABNORMAL HIGH (ref 70–99)
Glucose-Capillary: 128 mg/dL — ABNORMAL HIGH (ref 70–99)
Glucose-Capillary: 97 mg/dL (ref 70–99)

## 2012-12-30 LAB — BASIC METABOLIC PANEL
BUN: 28 mg/dL — ABNORMAL HIGH (ref 6–23)
CO2: 27 mEq/L (ref 19–32)
Calcium: 8.6 mg/dL (ref 8.4–10.5)
Chloride: 113 mEq/L — ABNORMAL HIGH (ref 96–112)
Creatinine, Ser: 0.7 mg/dL (ref 0.50–1.35)
GFR calc Af Amer: >90 mL/min (ref >90)
GFR calc non Af Amer: >90 mL/min (ref >90)
Glucose, Bld: 135 mg/dL — ABNORMAL HIGH (ref 70–99)
Potassium: 3.9 mEq/L (ref 3.5–5.1)
Sodium: 146 mEq/L — ABNORMAL HIGH (ref 135–145)

## 2012-12-30 MED ORDER — CIPROFLOXACIN IN D5W 400 MG/200ML IV SOLN
400.0000 mg | Freq: Once | INTRAVENOUS | Status: AC
  Administered 2012-12-31: 400 mg via INTRAVENOUS
  Filled 2012-12-30: qty 200

## 2012-12-30 MED ORDER — PIVOT 1.5 CAL PO LIQD
1000.0000 mL | ORAL | Status: DC
  Administered 2012-12-30: 1000 mL
  Filled 2012-12-30 (×3): qty 1000

## 2012-12-30 MED ORDER — PRO-STAT SUGAR FREE PO LIQD
30.0000 mL | Freq: Three times a day (TID) | ORAL | Status: DC
  Administered 2012-12-30 (×2): 30 mL
  Filled 2012-12-30 (×5): qty 30

## 2012-12-30 MED ORDER — PANTOPRAZOLE SODIUM 40 MG PO PACK
40.0000 mg | PACK | Freq: Every day | ORAL | Status: DC
  Administered 2012-12-30 – 2013-01-03 (×4): 40 mg
  Filled 2012-12-30 (×5): qty 20

## 2012-12-30 NOTE — Progress Notes (Signed)
Physical Therapy Treatment Patient Details Name: Albert [name] MRN: [id number] DOB: 09-29-1975 Today's Date: 12/30/2012 Time: 1202-1231 PT Time Calculation (min): 29 min  PT Assessment / Plan / Recommendation Comments on Treatment Session  pt presents with fall (Etoh related) resulting in fronto-temporo-parietal SDH post Crani with bone flap in L UQ.  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  Over weekend pt demo'd seizure activity with L gaze deviation and L Extensor Posture 2/2 New L Intraparenchymal Hematoma.  pt now s/p 2nd Crani with drain in place and Bone flap moved from L UQ to R UQ.  pt now at [facility] with localized repsonse.  pt nodding his head to 2 different yes/no questions wife asked about ultrasound of new baby and about daughter.  pt directing gaze at picture of his daughter with increased restlessness and pushing posteriorly.  Improved localization from previous sessions on Friday.      Follow Up Recommendations  CIR     Does the patient have the potential to tolerate intense rehabilitation     Barriers to Discharge        Equipment Recommendations  None recommended by PT    Recommendations for Other Services Rehab consult  Frequency Min 3X/week   Plan Discharge plan remains appropriate;Frequency remains appropriate    Precautions / Restrictions Precautions Precautions: Fall Precaution Comments: pt with L Crani with drain and bone flap in RT UQ.   Restrictions Weight Bearing Restrictions: No   Pertinent Vitals/Pain Did not indicate pain.      Mobility  Bed Mobility Bed Mobility: Supine to Sit;Sitting - Scoot to Edge of Bed;Sit to Supine;Scooting to [facility];Rolling Right Rolling Right: 1: +2 Total assist Rolling Right: Patient Percentage: 0% Rolling Left: 1: +2 Total assist Rolling Left: Patient Percentage: 0% Supine to Sit: 1: +2 Total assist;HOB elevated Supine to Sit: Patient Percentage: 0% Sitting - Scoot to Edge of Bed: 1:  +2 Total assist Sitting - Scoot to Edge of Bed: Patient Percentage: 0% Sit to Supine: 1: +2 Total assist;HOB flat Sit to Supine: Patient Percentage: 0% Scooting to HOB: 1: +2 Total assist Scooting to [facility]: Patient Percentage: 0% Details for Bed Mobility Assistance: pt now with drain s/p 2nd Crani on L with bone flap out.  pt tends to rotate head to L side with soft area of skull to bed surface.   Transfers Transfers: Not assessed Ambulation/Gait Ambulation/Gait Assistance: Not tested (comment) Stairs: No Wheelchair Mobility Wheelchair Mobility: No    Exercises     PT Diagnosis:    PT Problem List:   PT Treatment Interventions:     PT Goals Acute Rehab PT Goals Time For Goal Achievement: 01/02/13 Potential to Achieve Goals: Good PT Goal: Supine/Side to Sit - Progress: Progressing toward goal PT Goal: Sit at Edge Of Bed - Progress: Progressing toward goal PT Goal: Sit to Supine/Side - Progress: Progressing toward goal Additional Goals PT Goal: Additional Goal #1 - Progress: Partly met PT Goal: Additional Goal #2 - Progress: Progressing toward goal  Visit Information  Last PT Received On: 12/30/12 Assistance Needed: +2 PT/OT Co-Evaluation/Treatment: Yes    Subjective Data      Cognition  Cognition Arousal/Alertness: Awake/alert Behavior During Therapy: Restless Overall Cognitive Status: Difficult to assess Area of Impairment: Rancho level Current Attention Level: Focused Following Commands: Follows one step commands inconsistently General Comments: pt with spontaneous eye opening with gaze directed towards pt's L side and occasionally starts gaze at midline then  drifts to Lt. Pt this session noted to have a slow drift to midline and then returning to left gaze deviation. Not characterized by a nystagmus Difficult to assess due to: Intubated Rancho Levels of Cognitive Functioning Rancho Los Amigos Scales of Cognitive Functioning: Localized response    Balance   Balance Balance Assessed: Yes Static Sitting Balance Static Sitting - Balance Support: Feet unsupported;Left upper extremity supported Static Sitting - Level of Assistance: 1: +2 Total assist Static Sitting - Comment/# of Minutes: pt fluctuated between 0-10% as he at times was holding his head upright  and demo'd increased posture pushing posteriorly.    End of Session PT - End of Session Activity Tolerance: Patient limited by fatigue Patient left: in bed;with call bell/phone within reach;with family/visitor present Nurse Communication: Mobility status   GP     Sunny Schlein, [place] [phone] 12/30/2012, 3:09 PM

## 2012-12-30 NOTE — Progress Notes (Signed)
Prolonged EEG completed

## 2012-12-30 NOTE — Progress Notes (Signed)
Speech Language Pathology Treatment Patient Details Name: Albert [name] MRN: [id number] DOB: 1976/07/27 Today's Date: 12/30/2012 Time: 4098-1191 SLP Time Calculation (min): 30 min  Assessment / Plan / Recommendation Clinical Impression  Treatment session today focused on providing opportunities for cognitive recovery with PT/OT present. Today pt back on ventilator, s/p new left frontal intracerebral hematoma with subdural hematoma with increasing shift and mass effect with evacuation on 4/19.   Pt exhibited behaviors consistent with a Ranchos level III (localized response). Pt demonstrated focused attention to auditory stimuli, appeared to turn head in direction of voice (L/R). When SLP utilized wife as therapeutic agent, pt demosntrated purposeful response (head nod) x2 to emotionally significant Y/N questions. With max visual, verbal cues there is still little evidence for visual tracking or eye closure to threat. Suspect visual impairment. Overall pt more restless, making purposeful, localized movements such as pulling at lines/ventilator. Pt making progress, will continue to follow and recommend CIR for d/c.    SLP Plan  Continue with current plan of care    Pertinent Vitals/Pain NA  SLP Goals  SLP Goals Potential to Achieve Goals: Good SLP Goal #1: Pt will follow one step commands with max verbal/tactile cues x3.  SLP Goal #1 - Progress: Progressing toward goal SLP Goal #2: Pt will sustain attention to basic functional task for 30 seconds with max contextual cues.  SLP Goal #2 - Progress: Progressing toward goal SLP Goal #3: Pt will verbalize at word level (following extubation ) with max verbal/contextual cues.   General Temperature Spikes Noted: Yes Respiratory Status: Ventilator Behavior/Cognition: Alert Patient Positioning: Other (comment) (edge of bed)  Oral Cavity - Oral Hygiene     Treatment Treatment focused on: Cognition;Coma recovery   GO    Harlon Ditty, MA CCC-SLP [phone]  Claudine Mouton 12/30/2012, 1:55 PM

## 2012-12-30 NOTE — Progress Notes (Signed)
EEG completed.

## 2012-12-30 NOTE — Progress Notes (Signed)
Subjective: No further seizure episode.   Exam: Filed Vitals:   12/30/12 1100  BP: 140/94  Pulse: 92  Temp:   Resp: 19   Gen: In bed, NAD MS: Awake to mild stimulus. does nto follow commands.  WG:NFAOZ, does not fixate or track, eyes slightly dysconjugate, but they conjugate when aroused.  Motor: withdraws all extremities to pain, though less on the right LE.  Sensory:reponds to nox stim x4.   Impression: 37 yo M with seizures after SDH and possible cardiac arrest. His angio did show some displacement of the MCA and his MRI is suggestive of ischemia of left hemisphere. I suspect that a combination of hypotension from arrest with mass effect from bleed could have caused ischemia of these areas. There is a possibility that seizure can cause changes like this, but due to the distribution I feel this is less likely.   Recommendations: 1) Continue AEDs at current doses 2) Prolonged EEG today to look for subclincial seizures.   Ritta Slot, MD [facility] [phone]  If 7pm- 7am, please page neurology on call at [phone].

## 2012-12-30 NOTE — Progress Notes (Signed)
Occupational Therapy Treatment Patient Details Name: Albert [name] MRN: [id number] DOB: 05-23-1976 Today's Date: 12/30/2012 Time: 4098-1191 OT Time Calculation (min): 32 min  OT Assessment / Plan / Recommendation Comments on Treatment Session 37 s/p fall (ETOH) related with lt fronto-temporo- parietal SDH with craniotomy (bone flap lt abdomen) Pt remains rancho III (localized response )pt presents with fall (Etoh related) resulting in L fronto-temporo-parietal SDH post Crani with bone flap in L UQ.  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  Pt demonstrates tonic clonic seizure followed by lt eye deviation rt side face flaccid. s/p intraparenchymal hematoma with extensor posturing on left. Pt with craniotomy to evacuation SDH and bone flap moved to Rt lower quadrant. Pt demonstrates hear nod yes and no    Follow Up Recommendations  CIR    Barriers to Discharge       Equipment Recommendations  Other (comment)    Recommendations for Other Services Rehab consult  Frequency Min 3X/week   Plan Discharge plan remains appropriate    Precautions / Restrictions Precautions Precautions: Fall Precaution Comments: pt with L Crani with drain and bone flap in RT UQ.   Restrictions Weight Bearing Restrictions: No   Pertinent Vitals/Pain Coughing on vent Attempting to pull at vent    ADL  Eating/Feeding: NPO ADL Comments: Pt restless supine in the bed. Pt opening eyes with name call. Pt with eyes deviated to the left. Pt not blinking to threat at this time. Pt supported total +2 supine <>Sit . Pt demonstrates localized response to lines by attempting to pull at vent. Pt provided v/c to squeeze SLP hands. PT squeezing but question due to palm tactile input. Pt 's life arriving and telling patient that the ultrasound for her pregancy was good. Wife asked a yes no question regarding baby and pt shook head yes . Pt again provided yes no question regarding daughter mila. Pt nodding  head yes. Pt provided video of mila singing and increase restless behavior. Pt with strong response to daughter. PT demonstrates Rancho III behavior. Pts wife asking if CT scan performed today and questions regarding trach. Increase arousal compared to Thursday 12/26/12    OT Diagnosis:    OT Problem List:   OT Treatment Interventions:     OT Goals Acute Rehab OT Goals OT Goal Formulation: With family Time For Goal Achievement: 01/02/13 Potential to Achieve Goals: Good ADL Goals Pt Will Perform Grooming: with max assist;Supported;Sitting, edge of bed;with cueing (comment type and amount) Miscellaneous OT Goals Miscellaneous OT Goal #1: Pt will follow 1 step simple command 2 out 5 trialls OT Goal: Miscellaneous Goal #1 - Progress: Progressing toward goals Miscellaneous OT Goal #2: Pt will demonstrate localized response 3 out 4 trials OT Goal: Miscellaneous Goal #2 - Progress: Progressing toward goals Miscellaneous OT Goal #3: Pt will tolerate EOB sitting ~5 minutes with max (A) OT Goal: Miscellaneous Goal #3 - Progress: Progressing toward goals  Visit Information  Last OT Received On: 12/30/12 Assistance Needed: +2 PT/OT Co-Evaluation/Treatment: Yes    Subjective Data      Prior Functioning       Cognition  Cognition Arousal/Alertness: Awake/alert Behavior During Therapy: Restless Overall Cognitive Status: Difficult to assess Area of Impairment: Rancho level Current Attention Level: Focused Following Commands: Follows one step commands inconsistently General Comments: pt with spontaneous eye opening with gaze directed towards pt's L side and occasionally starts gaze at midline then drifts to Lt. Pt this session noted to have a  slow drift to midline and then returning to left gaze deviation. Not characterized by a nystagmus Difficult to assess due to: Intubated Rancho Levels of Cognitive Functioning Rancho Los Amigos Scales of Cognitive Functioning: Localized response     Mobility  Bed Mobility Rolling Right: 1: +2 Total assist Rolling Right: Patient Percentage: 0% Rolling Left: 1: +2 Total assist Rolling Left: Patient Percentage: 0% Scooting to HOB: 1: +2 Total assist Scooting to [facility]: Patient Percentage: 0% Details for Bed Mobility Assistance: Pt with bone flap out log rolled to the Rt side for bed mobility. Pt rotating head to the left and soft area of skull against bed surface Transfers Transfers: Not assessed    Exercises      Balance Static Sitting Balance Static Sitting - Balance Support: Feet unsupported;Left upper extremity supported Static Sitting - Level of Assistance: 1: +2 Total assist Static Sitting - Comment/# of Minutes: pt varied 0-10% as he at times assisted with holding his head upright. pt with incr posture pushing   End of Session OT - End of Session Activity Tolerance: Patient tolerated treatment well Patient left: in bed;with call bell/phone within reach;with family/visitor present Nurse Communication: Mobility status;Precautions;Need for lift equipment  GO     Lucile Shutters 12/30/2012, 1:29 PM Pager: [phone]

## 2012-12-30 NOTE — Progress Notes (Signed)
Patient ID: [name], male   DOB: July 17, 1976, 37 y.o.   MRN: [id number]   LOS: 16 days   Subjective: Sedated, on vent. By RN report was significantly agitated during WUA, no FC.   Objective: Vital signs in last 24 hours: Temp:  [99.3 F (37.4 C)-101.2 F (38.4 C)] 99.3 F (37.4 C) (04/21 0700) Pulse Rate:  [71-116] 77 (04/21 0800) Resp:  [10-24] 10 (04/21 0800) BP: (112-146)/(70-101) 125/77 mmHg (04/21 0800) SpO2:  [99 %-100 %] 100 % (04/21 0800) FiO2 (%):  [40 %] 40 % (04/21 0743) Weight:  [199 lb 15.3 oz (90.7 kg)] 199 lb 15.3 oz (90.7 kg) (04/21 0500) Last BM Date: 12/27/12   VENT: CPAP/PS 5/5cm  UOP: 69ml/h NET: +1837ml/24h TOTAL: +1260ml/admission   JP: 74ml/24h   Laboratory CBC  Recent Labs  12/28/12 0905 12/29/12 0438  WBC 18.8* 11.5*  HGB 13.0 11.1*  HCT 38.0* 32.5*  PLT 463* 364   BMET  Recent Labs  12/28/12 0905 12/29/12 0438  NA 144 146*  K 3.7 3.9  CL 105 110  CO2 28 26  GLUCOSE 152* 124*  BUN 33* 37*  CREATININE 0.70 1.00  CALCIUM 9.6 9.1   CBG (last 3)   Recent Labs  12/30/12 0002 12/30/12 0326 12/30/12 0818  GLUCAP 128* 104* 121*    Radiology CXR: Pending   Physical Exam General appearance: no distress Resp: Mild adventitia left, I suspect transmitted from ETT Cardio: regular rate and rhythm GI: Soft, +BS. Bone flap now in RLQ. Extremities: Warm Neuro: [id number] = 9, pupils = but NR (maybe slight reaction OS)   Assessment/Plan: Fall  TBI s/p decompressive craniectomy x2 -- per NS Facial abrasions -- Local care  ABL anemia -- Check today VDRF -- Continue support  FEN -- Tolerating TF. Recheck BMET with mild hypernatremia. VTE -- SCD's  Dispo -- May extubated depending on timing of PEG. Will d/w MD.   Critical care time: 0910 -- 0935    Freeman Caldron, PA-C Pager: [phone] General Trauma PA Pager: [phone]   12/30/2012

## 2012-12-30 NOTE — Procedures (Signed)
This wa a prolonged EEG recording from 1:20pm until 5:20 pm.   History: 37 yo M with Left SDH and seizures  Background: The background consists of irregular delta activity more prominant on the left than right with some intermixed alpha and theta activities. Sleep spindles appear asymmetric, better seen on the right than left.   The patetn became agitated at 3:10 pm, and though no seizure activity was seen, soon thereafter, tehre was significant artifact, but at least some of the left sided leads remained with no visible seizure actiivty. Atl 3:50pm, the recording cleared witht e patient more awake. He had a posterior rhythm that was moderately organized and achieved a frequency of 7 Hz.  Photic stimulation: Physiologic driving is not performed  EEG Abnormalities: 1) irregular delta on the left 2) decreased sleep spindles on the left 3) Generalized irregular slow activity.  4) Slow PDR  Clinical Interpretation: This EEG is consistent with a left sided cerebral dysfunction as well as a generalized non-specific cerebral dysfunction(encephalopathy).  No seizure was recorded.   Ritta Slot, MD [facility] [phone]  If 7pm- 7am, please page neurology on call at [phone].

## 2012-12-30 NOTE — Progress Notes (Signed)
Subjective: Patient reports unresponsive, on ventilator  Objective: Vital signs in last 24 hours: Temp:  [99.3 F (37.4 C)-101.2 F (38.4 C)] 99.3 F (37.4 C) (04/21 0700) Pulse Rate:  [71-116] 77 (04/21 0800) Resp:  [10-24] 10 (04/21 0800) BP: (112-146)/(70-101) 125/77 mmHg (04/21 0800) SpO2:  [99 %-100 %] 100 % (04/21 0800) FiO2 (%):  [40 %] 40 % (04/21 0743) Weight:  [90.7 kg (199 lb 15.3 oz)] 90.7 kg (199 lb 15.3 oz) (04/21 0500)  Intake/Output from previous day: 04/20 0701 - 04/21 0700 In: 3189 [I.V.:2289; NG/GT:600] Out: 1383 [Urine:1305; Drains:78] Intake/Output this shift: Total I/O In: 418.1 [I.V.:163.1; NG/GT:110; IV Piggyback:145] Out: 185 [Urine:125; Drains:60]  Physical Exam: Awakens with minimal stimulus.  PERRL. Not F/C.  Scalp flap sunken, JP draining well.  Dressing CDI.  Lab Results:  Recent Labs  12/28/12 0905 12/29/12 0438  WBC 18.8* 11.5*  HGB 13.0 11.1*  HCT 38.0* 32.5*  PLT 463* 364   BMET  Recent Labs  12/28/12 0905 12/29/12 0438  NA 144 146*  K 3.7 3.9  CL 105 110  CO2 28 26  GLUCOSE 152* 124*  BUN 33* 37*  CREATININE 0.70 1.00  CALCIUM 9.6 9.1    Studies/Results: Ct Head Wo Contrast  12/29/2012  *RADIOLOGY REPORT*  Clinical Data: Fall, follow-up hematoma following craniotomy  CT HEAD WITHOUT CONTRAST  Technique:  Contiguous axial images were obtained from the base of the skull through the vertex without contrast.  Comparison: 12/28/2012  Findings: Postsurgical changes overlying the left frontoparietal region.  Interval evacuation of overlying left extra-axial hemorrhage, improved, with placement of a surgical drain. Gas/stranding/hemorrhage in the left frontal scalp with overlying skin staples.  Small amount of parafalcine subdural hemorrhage which layers along the left tentorium, unchanged.  Associated 6 mm rightward midline shift (series 2/image 16), previously 8 mm.  Basal cisterns remain patent.  Bilateral temporal horns are  mildly dilated.  Otherwise, no ventriculomegaly.  No evidence of interventricular hemorrhage.  Stable hypodensity in the ventral aspect of the left thalamus (series 2/image 16).  The visualized paranasal sinuses are essentially clear. The mastoid air cells are unopacified.  IMPRESSION: Postsurgical changes with interval evacuation of the left extra- axial collection with placement of a surgical drain.  6 mm rightward midline shift, decreased.  Basal cisterns remain patent.  Temporal horns of the lateral ventricles are mildly dilated.  Small parafalcine subdural hemorrhage which layers along the left tentorium, unchanged.  Stable hypodensity in the ventral aspect of the left thalamus.   Original Report Authenticated By: Charline Bills, M.D.    Ct Head Wo Contrast  12/28/2012  *RADIOLOGY REPORT*  Clinical Data: 37 year old male with extensor posturing.  Severe head trauma.  Cardiac arrest.  Status post craniectomy on the left.  CT HEAD WITHOUT CONTRAST  Technique:  Contiguous axial images were obtained from the base of the skull through the vertex without contrast.  Comparison: 0955 hours the same day and earlier.  Findings: Stable paranasal sinuses and mastoids.  Stable orbit soft tissues.  Sequelae of left lateral craniectomy. Stable visualized osseous structures.  Hyperdense and tubular hemorrhage along the surface of the brain at the left craniectomy site has not significantly changed in size or configuration allowing for mildly different slice angulation. Parafalcine subdural hemorrhage in the brain re-identified and new since 12/24/2012.  This layers along the left tentorium as seen earlier today.  Other left scalp soft tissue swelling and fluid/blood products are stable.  Rightward midline shift of the brain measures  up to 8 mm (6-7 mm earlier today).  No ventriculomegaly or intraventricular hemorrhage.  No new areas of intracranial hemorrhage.  Basilar cisterns remain patent.  Trace posterior and right  subdural blood on the recent MRI is not visible by CT.  There are subtle gyriform hyperdense changes in the medial left frontal and parietal lobe.  There were some diffusion changes recently in this area.  No areas of cytotoxic edema identified in the brain to correspond with the recent MRI diffusion findings.  However, there is evidence of increased ventral left thalamus hypodensity compared to 12/24/2012 and earlier.  IMPRESSION: 1. Slightly increased rightward midline shift from this morning, now up to 8 mm (previously 6-7 mm). Basilar cisterns remain patent. 2.  No significant change in left extra-axial hemorrhage which has developed since the 12/24/2012 MRI. 3.  Hypodensity in the ventral left thalamus new since 12/24/2012. Question new gyriform hyperdensity in the medial left posterior frontal and parietal lobes. It is possible that these changes as well as some of the diffusion changes seen more recently are post ictal?  Other diffusion changes at that time remain occult on CT.  Salient findings discussed with Dr. Kerri Perches by telephone at the time of dictation.   Original Report Authenticated By: Erskine Speed, M.D.    Ct Head Wo Contrast  12/28/2012  *RADIOLOGY REPORT*  Clinical Data: Seizures this morning.  Left eye deviation and tonic clonic contractions.  CT HEAD WITHOUT CONTRAST  Technique:  Contiguous axial images were obtained from the base of the skull through the vertex without contrast.  Comparison: MRI 12/25/2010.  CT 12/21/2012.  Findings: Left craniectomy is present.  Since the prior CT and MRI, there has been hemorrhage into the craniectomy defect.  The craniectomy involves the frontal, parietal and temporal bones.  There is a hematoma in the craniectomy bed measuring 7 cm AP, 24 mm transverse.  This is extensive in the cranial caudal direction, extending the full length of the craniectomy defect (7 cm).  There is hematocrit in this hematoma, with low attenuation fluid more anteriorly blends  with the fat along the scalp.  Mass effect on the left cerebral hemisphere is mild as of subarachnoid blood.  There is however, left to right midline shift is 6 mm which is new compared to the prior CT and MRI.  No intraventricular blood is identified.  Subdural blood is present along the falx and left tentorium cerebelli.  Motion artifact is present on the examination.  Paranasal sinus disease appears similar.  IMPRESSION:  1.  New left craniectomy bed hematoma with mass effect on the left cerebral hemisphere and new 6 mm of left to right midline shift. 2.  These results will be called to the ordering clinician or representative by the Radiologist Assistant, and communication documented in the PACS Dashboard.   Original Report Authenticated By: Andreas Newport, M.D.    Dg Chest Port 1 View  12/28/2012  *RADIOLOGY REPORT*  Clinical Data: 37 year old male endotracheal tube placement.  PORTABLE CHEST - 1 VIEW  Comparison: 12/22/2012 and earlier.  Findings: Semi upright AP portable view 2004 hours.  Endotracheal tube tip at the level of clavicles.  Feeding tube courses to the abdomen, tip at the level of the gastric body.  Left PICC line appears stable.  Larger lung volumes.  Cardiac size and mediastinal contours are within normal limits.  Allowing for portable technique, the lungs are clear.  IMPRESSION: 1.  Endotracheal tube tip in good position at the  level of clavicles. 2.  Other lines and tubes as above. 3. No acute cardiopulmonary abnormality.   Original Report Authenticated By: Erskine Speed, M.D.     Assessment/Plan: Improving neuro exam with no recent seizures, flap without evidence of pressure.  OK to wean ventilator to extubate as tolerated.    LOS: 16 days    Albert Heckle, MD 12/30/2012, 9:58 AM

## 2012-12-30 NOTE — Progress Notes (Signed)
Eyes open but very agitated.  Will need trach/PEG.  Plan for that tomorrow.  I will discuss this with his wife.  Appreciate NS assistance - I D/W Dr. Venetia Maxon this AM as well. Patient examined and I agree with the assessment and plan  Violeta Gelinas, MD, MPH, FACS Pager: [phone]  12/30/2012 3:18 PM

## 2012-12-30 NOTE — Progress Notes (Signed)
NUTRITION FOLLOW UP  Intervention:   Change goal rate of Pivot 1.5 to 55 ml/hr with 30 ml Prostat TID  TF regimen provides: 2280 kcal (104% of needs) and 168 grams protein ( >100% of minimum needs), and 1001 ml H2O. Total free water (tube feeding and free water flushes): 2201 ml.  Nutrition Dx:   Inadequate oral intake related to inability to eat as evidenced by NPO status; ongoing.  Goal:   Pt to meet >/= 90% of their estimated nutrition needs; met  Monitor:   TF tolerance, weight, labs  Assessment:   Pt admitted after fall, positive for ETOH. Has SDH and SAH, multiple facial fractures, s/p decompressive craniectomy of L SDH. Pt with questionable cardiac arrest with CPR. Pt extubated 4/14. Unable to pass swallow eval at that time.  Pt continues with persistent fevers of 99-101.  Na remains elevated but is decreasing, free water flushes changed 4/17.   Pt transferred to 3100 for seizures, pt required second craniectomy for hematoma evacuation 4/19, pt had bone flap moved to allow for PEG placement. Pt discussed during rounds and with RN.   Patient is currently intubated on ventilator support.  MV: 9 Temp:Temp (24hrs), Avg:100.3 F (37.9 C), Min:99.3 F (37.4 C), Max:101.2 F (38.4 C)  Spoke at length with pt's mom. Per mom pt is very health conscientious. Pt was training for a marathon. Answered her questions about the TF formula.   300 ml H2O every 6 hours   Height: Ht Readings from Last 1 Encounters:  12/15/12 5\' 11"  (1.803 m)    Weight Status:   Wt Readings from Last 1 Encounters:  12/30/12 199 lb 15.3 oz (90.7 kg)  Admission weight 216 lb Usual weight: 203-204 lb  Re-estimated needs:  Kcal: 2187  Protein: 139-181 grams Fluid: > 2.7 L/day  Skin: incisions  Diet Order: NPO   Intake/Output Summary (Last 24 hours) at 12/30/12 0931 Last data filed at 12/30/12 0900  Gross per 24 hour  Intake 3447.17 ml  Output   1568 ml  Net 1879.17 ml    Last BM:  4/18   Labs:   Recent Labs Lab 12/27/12 0520 12/28/12 0905 12/29/12 0438  NA 151* 144 146*  K 3.9 3.7 3.9  CL 112 105 110  CO2 29 28 26   BUN 33* 33* 37*  CREATININE 0.70 0.70 1.00  CALCIUM 10.2 9.6 9.1  GLUCOSE 166* 152* 124*    CBG (last 3)   Recent Labs  12/30/12 0002 12/30/12 0326 12/30/12 0818  GLUCAP 128* 104* 121*    Scheduled Meds: . antiseptic oral rinse  15 mL Mouth Rinse QID  . chlorhexidine  15 mL Mouth Rinse BID  . free water  300 mL Per Tube Q6H  . insulin aspart  0-15 Units Subcutaneous Q4H  . lacosamide (VIMPAT) IV  150 mg Intravenous Q12H  . levETIRAcetam  1,500 mg Intravenous Q12H  . LORazepam  1 mg Intravenous Once  . multivitamin  5 mL Oral Daily  . pantoprazole (PROTONIX) IV  40 mg Intravenous QHS  . phenytoin (DILANTIN) IV  100 mg Intravenous Q8H  . propranolol  40 mg Per Tube Q8H  . sodium chloride  10-40 mL Intracatheter Q12H    Continuous Infusions: . 0.9 % NaCl with KCl 20 mEq / L 1,000 mL (12/30/12 0319)  . feeding supplement (PIVOT 1.5 CAL) 1,000 mL (12/29/12 1200)  . fentaNYL infusion INTRAVENOUS 50 mcg/hr (12/30/12 0912)  . sodium chloride 0.45 % 1,000 mL infusion 30  mL/hr at 12/29/12 [address], LDN, CNSC [phone] Pager [phone] After Hours Pager

## 2012-12-30 NOTE — Clinical Social Work Note (Signed)
Clinical Social Worker following for family support.  CM pursuing possible placement at [facility] inpatient rehab pending patient progress and route of feeding.  [facility] still watching patient progress for potential admit.  CSW available for family support as needed.    Macario Golds, [place] [phone]

## 2012-12-30 NOTE — Progress Notes (Signed)
Stopped by pt room earlier today, however he was resting. Met w/pt's family in waiting area. Family appreciative of continued support. Marjory Lies Chaplain

## 2012-12-31 ENCOUNTER — Encounter (HOSPITAL_COMMUNITY): Payer: Self-pay | Admitting: *Deleted

## 2012-12-31 ENCOUNTER — Inpatient Hospital Stay (HOSPITAL_COMMUNITY): Payer: BC Managed Care – PPO

## 2012-12-31 ENCOUNTER — Encounter (HOSPITAL_COMMUNITY): Admission: EM | Disposition: A | Discharge status: Rehabilitation Facility | Payer: Self-pay | Source: Home / Self Care

## 2012-12-31 ENCOUNTER — Encounter (HOSPITAL_COMMUNITY): Payer: Self-pay | Admitting: Neurosurgery

## 2012-12-31 DIAGNOSIS — R627 Adult failure to thrive: Secondary | ICD-10-CM

## 2012-12-31 HISTORY — PX: PEG PLACEMENT: SHX5437

## 2012-12-31 HISTORY — PX: PERCUTANEOUS TRACHEOSTOMY: SHX5288

## 2012-12-31 LAB — BLOOD GAS, ARTERIAL
Acid-base deficit: 0.7 mmol/L (ref 0.0–2.0)
Bicarbonate: 22.8 mEq/L (ref 20.0–24.0)
Drawn by: 222511
FIO2: 0.3 %
Mode: POSITIVE
O2 Saturation: 99.3 %
PEEP: 5 cmH2O
Patient temperature: 99.1
Pressure support: 5 cmH2O
TCO2: 23.8 mmol/L (ref 0–100)
pCO2 arterial: 33.7 mmHg — ABNORMAL LOW (ref 35.0–45.0)
pH, Arterial: 7.446 (ref 7.350–7.450)
pO2, Arterial: 197 mmHg — ABNORMAL HIGH (ref 80.0–100.0)

## 2012-12-31 LAB — CULTURE, RESPIRATORY W GRAM STAIN

## 2012-12-31 LAB — GLUCOSE, CAPILLARY
Glucose-Capillary: 103 mg/dL — ABNORMAL HIGH (ref 70–99)
Glucose-Capillary: 105 mg/dL — ABNORMAL HIGH (ref 70–99)
Glucose-Capillary: 109 mg/dL — ABNORMAL HIGH (ref 70–99)
Glucose-Capillary: 140 mg/dL — ABNORMAL HIGH (ref 70–99)
Glucose-Capillary: 79 mg/dL (ref 70–99)
Glucose-Capillary: 85 mg/dL (ref 70–99)

## 2012-12-31 LAB — CBC
HCT: 24.7 % — ABNORMAL LOW (ref 39.0–52.0)
Hemoglobin: 8.4 g/dL — ABNORMAL LOW (ref 13.0–17.0)
MCH: 28.8 pg (ref 26.0–34.0)
MCHC: 34 g/dL (ref 30.0–36.0)
MCV: 84.6 fL (ref 78.0–100.0)
Platelets: 262 10*3/uL (ref 150–400)
RBC: 2.92 MIL/uL — ABNORMAL LOW (ref 4.22–5.81)
RDW: 12.4 % (ref 11.5–15.5)
WBC: 8.7 10*3/uL (ref 4.0–10.5)

## 2012-12-31 LAB — CULTURE, RESPIRATORY

## 2012-12-31 SURGERY — CREATION, TRACHEOSTOMY, PERCUTANEOUS
Anesthesia: LOCAL | Site: Neck | Wound class: Clean Contaminated

## 2012-12-31 SURGERY — Surgical Case
Anesthesia: *Unknown

## 2012-12-31 SURGERY — INSERTION, PEG TUBE

## 2012-12-31 MED ORDER — VECURONIUM BROMIDE 10 MG IV SOLR
10.0000 mg | Freq: Once | INTRAVENOUS | Status: AC
  Administered 2012-12-31: 10 mg via INTRAVENOUS

## 2012-12-31 MED ORDER — PROPOFOL 10 MG/ML IV BOLUS
50.0000 mg | Freq: Once | INTRAVENOUS | Status: AC
  Administered 2012-12-31: 50 mg via INTRAVENOUS

## 2012-12-31 MED ORDER — LIDOCAINE-EPINEPHRINE 0.5 %-1:200000 IJ SOLN
INTRAMUSCULAR | Status: DC | PRN
  Administered 2012-12-31: 10 mL

## 2012-12-31 MED ORDER — CEFAZOLIN SODIUM-DEXTROSE 2-3 GM-% IV SOLR
2.0000 g | Freq: Once | INTRAVENOUS | Status: AC
  Administered 2012-12-31: 2 g via INTRAVENOUS
  Filled 2012-12-31: qty 50

## 2012-12-31 MED ORDER — PROPOFOL 10 MG/ML IV BOLUS
30.0000 mg | Freq: Once | INTRAVENOUS | Status: AC
  Administered 2012-12-31: 30 mg via INTRAVENOUS

## 2012-12-31 MED ORDER — PIVOT 1.5 CAL PO LIQD
1000.0000 mL | ORAL | Status: DC
  Administered 2013-01-01 – 2013-01-03 (×2): 1000 mL
  Filled 2012-12-31 (×7): qty 1000

## 2012-12-31 MED ORDER — PROPOFOL 10 MG/ML IV BOLUS
200.0000 mg | Freq: Once | INTRAVENOUS | Status: AC
  Administered 2012-12-31: 200 mg via INTRAVENOUS

## 2012-12-31 MED ORDER — PRO-STAT SUGAR FREE PO LIQD
30.0000 mL | Freq: Three times a day (TID) | ORAL | Status: DC
  Administered 2012-12-31 – 2013-01-03 (×8): 30 mL
  Filled 2012-12-31 (×11): qty 30

## 2012-12-31 SURGICAL SUPPLY — 21 items
DRAPE PROXIMA HALF (DRAPES) ×6 IMPLANT
DRAPE UTILITY 15X26 W/TAPE STR (DRAPE) ×6 IMPLANT
ELECT CAUTERY BLADE 6.4 (BLADE) ×3 IMPLANT
ELECT REM PT RETURN 9FT ADLT (ELECTROSURGICAL) ×3
ELECTRODE REM PT RTRN 9FT ADLT (ELECTROSURGICAL) ×2 IMPLANT
GAUZE SPONGE 4X4 16PLY XRAY LF (GAUZE/BANDAGES/DRESSINGS) ×3 IMPLANT
GLOVE BIO SURGEON STRL SZ8 (GLOVE) ×6 IMPLANT
GLOVE BIOGEL PI IND STRL 8 (GLOVE) ×4 IMPLANT
GLOVE BIOGEL PI INDICATOR 8 (GLOVE) ×2
GOWN PREVENTION PLUS XLARGE (GOWN DISPOSABLE) ×6 IMPLANT
GOWN STRL NON-REIN LRG LVL3 (GOWN DISPOSABLE) ×5 IMPLANT
INTRODUCER TRACH BLUE RHINO 6F (TUBING) ×2 IMPLANT
INTRODUCER TRACH BLUE RHINO 8F (TUBING) IMPLANT
PENCIL BUTTON HOLSTER BLD 10FT (ELECTRODE) ×3 IMPLANT
SPONGE DRAIN TRACH 4X4 STRL 2S (GAUZE/BANDAGES/DRESSINGS) ×2 IMPLANT
SPONGE INTESTINAL PEANUT (DISPOSABLE) ×3 IMPLANT
SUT SILK 3 0 SH CR/8 (SUTURE) ×3 IMPLANT
SUT VICRYL AB 3 0 TIES (SUTURE) ×1 IMPLANT
TOWEL OR 17X24 6PK STRL BLUE (TOWEL DISPOSABLE) ×3 IMPLANT
TUBE CONNECTING 12X1/4 (SUCTIONS) ×3 IMPLANT
YANKAUER SUCT BULB TIP NO VENT (SUCTIONS) ×3 IMPLANT
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
## 2012-12-31 NOTE — Interval H&P Note (Signed)
History and Physical Interval Note:  12/31/2012 1:53 PM  [name]  has presented today for surgery, with the diagnosis of FAILURE TO THRIVE  The various methods of treatment have been discussed with the patient and family. After consideration of risks, benefits and other options for treatment, the patient has consented to  Procedure(s): PERCUTANEOUS ENDOSCOPIC GASTROSTOMY (PEG) PLACEMENT (BEDSIDE) (N/A) PERCUTANEOUS TRACHEOSTOMY (BEDSIDE) (N/A) as a surgical intervention .  The patient's history has been reviewed, patient examined, no change in status, stable for surgery.  I have reviewed the patient's chart and labs.  Questions were answered to the patient's satisfaction.     Albert [name]
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
## 2012-12-31 NOTE — Progress Notes (Signed)
Speech Language Pathology Treatment Patient Details Name: Albert [name] MRN: [id number] DOB: 01-15-1976 Today's Date: 12/31/2012 Time: 4098-1191 SLP Time Calculation (min): 25 min  Assessment / Plan / Recommendation Clinical Impression  Treatment focused on facilitation of cognitive recovery. With PT and OT present, patient presented with tactile and auditory stimuli for increased opportunity for response. Patient with localized response to name being called characterized by positive eye opening. Increased restlessness noted today, increased with stimulation provided. Overall, patient presents with behaviors consistent with a Rancho Level III although localized responses decreased from 4/21. SLP will continue to f/u.     SLP Plan  Continue with current plan of care    Pertinent Vitals/Pain None reported  SLP Goals  SLP Goals Potential to Achieve Goals: Good SLP Goal #1: Pt will follow one step commands with max verbal/tactile cues x3.  SLP Goal #1 - Progress: Progressing toward goal SLP Goal #2: Pt will sustain attention to basic functional task for 30 seconds with max contextual cues.  SLP Goal #2 - Progress: Progressing toward goal SLP Goal #3: Pt will verbalize at word level (following extubation ) with max verbal/contextual cues.  SLP Goal #3 - Progress: Progressing toward goal  General Respiratory Status: Ventilator Behavior/Cognition: Alert     GO   [name] MA, CCC-SLP [phone]   Sharline Lehane [name] 12/31/2012, 1:21 PM

## 2012-12-31 NOTE — H&P (Signed)
Spoke with wife about last 72 hours clinical course. Agrees with PEG and [facility]

## 2012-12-31 NOTE — Progress Notes (Signed)
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 Question anoxic brain injury. Pt with new onset of seizures and follow up scans reveal hematoma and pt with second cranitomy evacuation . Pt now with pending peg and questionable trach placement. PT presents as Rancho III (localized) this session.    Follow Up Recommendations  CIR    Barriers to Discharge       Equipment Recommendations  Other (comment)    Recommendations for Other Services Rehab consult  Frequency Min 3X/week   Plan Discharge plan remains appropriate    Precautions / Restrictions Precautions Precautions: Fall Precaution Comments: pt with L Crani with drain and bone flap in RT UQ.   Restrictions Weight Bearing Restrictions: No   Pertinent Vitals/Pain Restless pulling at tubes    ADL  Eating/Feeding: NPO ADL Comments: Pt supine and opening eyes to name calling. Pt restless and moving toward Lt side of the bed. Pt required (A) to prevent Lt side of head touching and pushing on bed rail. Pt long sitting in bed. Pt required hand over hand to prevent Lt UE from pulling on tubing. Pt with Rt UE posturing in a flexed position with sitting. Pt provided hand over hand for weight bearing on right UE. Pt coughing on vent and continously attempting to pull tubes. Pt sitting EOB for ~10 seconds with min (A). Pt requried total (A) due to restless behavior. Pt opening eyes to name call but no head nods this session. [name] asking questions prior to session about recovery. [name] educated that the brain needs time to heal and that patient currenlty with multiple injuries (x3 CVA  and CHI)  wife also educated that Right side is not actively moving with purpose which is concerning for long term deficits. [name] expressed that she just wants him to have his cognition. [name] expressed struggling with decision to approve trach or not. [name] advised to discuss further with doctors. It is not in OT scope of practice to discuss respiratory needs and redirected family back to experts.     OT Diagnosis:    OT Problem List:   OT Treatment Interventions:     OT Goals Acute Rehab OT Goals OT Goal Formulation: With family Time For Goal Achievement: 01/02/13 Potential to Achieve Goals: Good ADL Goals Pt Will Perform Grooming: with max assist;Supported;Sitting, edge of bed;with cueing (comment type and amount) ADL Goal: Grooming - Progress: Progressing toward goals Miscellaneous OT Goals Miscellaneous OT Goal #1: Pt will follow 1 step simple command 2 out 5 trialls OT Goal: Miscellaneous Goal #1 - Progress: Progressing toward goals Miscellaneous OT Goal #2: Pt will demonstrate localized response 3 out 4 trials OT Goal: Miscellaneous Goal #2 - Progress: Progressing toward goals Miscellaneous OT Goal #3: Pt will tolerate EOB sitting ~5 minutes with max (A) OT Goal: Miscellaneous Goal #3 - Progress: Progressing toward goals  Visit Information  Last OT Received On: 12/31/12 Assistance Needed: +3 or more PT/OT Co-Evaluation/Treatment: Yes    Subjective Data      Prior Functioning       Cognition  Cognition Arousal/Alertness: Awake/alert Behavior During Therapy: Restless Overall Cognitive Status: Difficult to assess Area of  Impairment: Rancho level Current Attention Level: Focused Following Commands: Follows one step commands inconsistently General Comments: pt with bouts of restlessness and chewing ET tube, then closes eyes and relaxes.  pt initially opens eyes to name, but not consistently throughout session.  During restless times pt reaching for ETT and other lines  with L UE.  pt more responsive to wife's voice, but less localization to wife today and more overall restlessness.   Difficult to assess due to: Intubated Rancho Levels of Cognitive Functioning Rancho Los Amigos Scales of Cognitive Functioning: Localized response    Mobility  Bed Mobility Bed Mobility: Supine to Sit;Sitting - Scoot to [place] of Bed;Sit to Supine;Scooting to [facility] Supine to Sit: 1: +2 Total assist;HOB elevated Supine to Sit: Patient Percentage: 0% Sitting - Scoot to Edge of Bed: 1: +2 Total assist Sitting - Scoot to Edge of Bed: Patient Percentage: 0% Sit to Supine: 1: +2 Total assist;HOB flat Sit to Supine: Patient Percentage: 0% Scooting to HOB: 1: +2 Total assist Scooting to [facility]: Patient Percentage: 0% Details for Bed Mobility Assistance: pt with increased restlessness at times moving trunk towards sitting and at other times resistant to mobility.  pt continues to move head about in bed getting L soft side close to bed rail despite repositioning.   Transfers Transfers: Not assessed    Exercises      Balance Balance Balance Assessed: Yes Static Sitting Balance Static Sitting - Balance Support: Feet unsupported;Left upper extremity supported Static Sitting - Level of Assistance: 1: +2 Total assist Static Sitting - Comment/# of Minutes: pt at times seems to attempt trunk movement to move towards sitting and at other times resistant.    End of Session OT - End of Session Activity Tolerance: Patient tolerated treatment well Patient left: in bed;with call bell/phone within reach;with family/visitor present Nurse Communication: Mobility status;Precautions;Need for lift equipment  GO     Lucile Shutters 12/31/2012, 1:35 PM Pager: [phone]

## 2012-12-31 NOTE — Progress Notes (Signed)
Patient ID: Albert [name], male   DOB: 03-20-1976, 37 y.o.   MRN: [id number] Follow up - Trauma Critical Care  Patient Details:    Albert [name] is an 37 y.o. male.  Lines/tubes : Airway 8 mm (Active)  Secured at (cm) 24 cm 12/31/2012  3:19 AM  Measured From Lips 12/31/2012  3:19 AM  Secured Location Right 12/31/2012  3:19 AM  Secured By [employer] 12/31/2012  3:19 AM  Tube Holder Repositioned Yes 12/31/2012  3:19 AM  Cuff Pressure (cm H2O) 20 cm H2O 12/30/2012 11:14 PM  Site Condition Dry 12/31/2012  3:19 AM     PICC / Midline Double Lumen 12/19/12 PICC Left Basilic (Active)  Indication for Insertion or Continuance of Line Prolonged intravenous therapies 12/30/2012  8:00 PM  Site Assessment Clean;Dry;Intact 12/30/2012  8:00 PM  Lumen #1 Status Infusing 12/30/2012  8:00 PM  Lumen #2 Status Infusing 12/30/2012  8:00 PM  Dressing Type Transparent 12/30/2012  8:00 PM  Dressing Status Clean;Dry;Intact 12/30/2012  8:00 PM  Line Care Connections checked and tightened 12/30/2012  8:00 PM  Dressing Change Due 01/01/13 12/28/2012  8:06 AM     Closed System Drain Left Bulb (JP) (Active)  Site Description Unremarkable 12/30/2012  8:00 PM  Dressing Status None 12/30/2012  8:00 PM  Drainage Appearance Serosanguineous 12/30/2012  8:00 PM  Status To suction (Charged) 12/30/2012  8:00 PM  Output (mL) 20 mL 12/31/2012  6:00 AM     Urethral Catheter 16 Fr. (Active)  Indication for Insertion or Continuance of Catheter Acute urinary retention;Urinary output monitoring;Prolonged immobilization 12/30/2012  8:00 PM  Site Assessment Clean;Intact;Dry 12/30/2012  8:00 PM  Collection Container Standard drainage bag 12/30/2012  8:00 PM  Securement Method Leg strap 12/30/2012  8:00 PM  Urinary Catheter Interventions Unclamped 12/30/2012  8:00 AM    Microbiology/Sepsis markers: Results for orders placed during the hospital encounter of 12/14/12  CULTURE, BLOOD (ROUTINE X 2)     Status: None    Collection Time    12/16/12 10:50 AM      Result Value Range Status   Specimen Description BLOOD LEFT HAND   Final   Special Requests BOTTLES DRAWN AEROBIC ONLY 3CC   Final   Culture  Setup Time 12/16/2012 14:21   Final   Culture NO GROWTH 5 DAYS   Final   Report Status 12/22/2012 FINAL   Final  CULTURE, BLOOD (ROUTINE X 2)     Status: None   Collection Time    12/16/12 10:58 AM      Result Value Range Status   Specimen Description BLOOD RIGHT ARM   Final   Special Requests BOTTLES DRAWN AEROBIC AND ANAEROBIC 10CC   Final   Culture  Setup Time 12/16/2012 14:21   Final   Culture NO GROWTH 5 DAYS   Final   Report Status 12/22/2012 FINAL   Final  URINE CULTURE     Status: None   Collection Time    12/16/12 12:03 PM      Result Value Range Status   Specimen Description URINE, CATHETERIZED   Final   Special Requests Normal   Final   Culture  Setup Time 12/16/2012 12:46   Final   Colony Count NO GROWTH   Final   Culture NO GROWTH   Final   Report Status 12/17/2012 FINAL   Final  CULTURE, RESPIRATORY (NON-EXPECTORATED)     Status: None   Collection Time    12/16/12  4:07 PM  Result Value Range Status   Specimen Description TRACHEAL ASPIRATE   Final   Special Requests NONE   Final   Gram Stain     Final   Value: ABUNDANT WBC PRESENT, PREDOMINANTLY PMN     RARE SQUAMOUS EPITHELIAL CELLS PRESENT     MODERATE GRAM NEGATIVE COCCI     MODERATE GRAM POSITIVE COCCI     IN PAIRS   Culture     Final   Value: ABUNDANT STREPTOCOCCUS PNEUMONIAE     ABUNDANT HAEMOPHILUS INFLUENZAE     Note: Beta Lactamase Positive     ABUNDANT MORAXELLA CATARRHALIS(BRANHAMELLA)     Note: BETA LACTAMASE POSITIVE   Report Status 12/20/2012 FINAL   Final   Organism ID, Bacteria STREPTOCOCCUS PNEUMONIAE   Final  CULTURE, BLOOD (ROUTINE X 2)     Status: None   Collection Time    12/28/12  2:10 PM      Result Value Range Status   Specimen Description BLOOD RIGHT ARM   Final   Special Requests BOTTLES  DRAWN AEROBIC ONLY 10 CC   Final   Culture  Setup Time 12/28/2012 19:58   Final   Culture     Final   Value:        BLOOD CULTURE RECEIVED NO GROWTH TO DATE CULTURE WILL BE HELD FOR 5 DAYS BEFORE ISSUING A FINAL NEGATIVE REPORT   Report Status PENDING   Incomplete  CULTURE, BLOOD (ROUTINE X 2)     Status: None   Collection Time    12/28/12  2:20 PM      Result Value Range Status   Specimen Description BLOOD RIGHT HAND   Final   Special Requests BOTTLES DRAWN AEROBIC ONLY 10CC   Final   Culture  Setup Time 12/28/2012 19:58   Final   Culture     Final   Value:        BLOOD CULTURE RECEIVED NO GROWTH TO DATE CULTURE WILL BE HELD FOR 5 DAYS BEFORE ISSUING A FINAL NEGATIVE REPORT   Report Status PENDING   Incomplete  URINE CULTURE     Status: None   Collection Time    12/28/12  3:40 PM      Result Value Range Status   Specimen Description URINE, CATHETERIZED   Final   Special Requests NONE   Final   Culture  Setup Time 12/28/2012 21:12   Final   Colony Count NO GROWTH   Final   Culture NO GROWTH   Final   Report Status 12/29/2012 FINAL   Final  CULTURE, RESPIRATORY (NON-EXPECTORATED)     Status: None   Collection Time    12/28/12 11:33 PM      Result Value Range Status   Specimen Description TRACHEAL ASPIRATE   Final   Special Requests NONE   Final   Gram Stain     Final   Value: FEW WBC PRESENT, PREDOMINANTLY PMN     RARE SQUAMOUS EPITHELIAL CELLS PRESENT     FEW GRAM POSITIVE COCCI IN PAIRS     RARE GRAM POSITIVE RODS   Culture Non-Pathogenic Oropharyngeal-type Flora Isolated.   Final   Report Status PENDING   Incomplete    Anti-infectives:  Anti-infectives   Start     Dose/Rate Route Frequency Ordered Stop   12/31/12 1230  ciprofloxacin (CIPRO) IVPB 400 mg     400 mg 200 mL/hr over 60 Minutes Intravenous  Once 12/30/12 1524     12/28/12 2200  ceFAZolin (ANCEF)  IVPB 1 g/50 mL premix     1 g 100 mL/hr over 30 Minutes Intravenous 3 times per day 12/28/12 2003 12/29/12 1458    12/28/12 1646  ceFAZolin (ANCEF) 2-3 GM-% IVPB SOLR    Comments:  DAY, DORY: cabinet override      12/28/12 1646 12/29/12 0459   12/20/12 1745  ceFEPIme (MAXIPIME) 1 g in dextrose 5 % 50 mL IVPB  Status:  Discontinued     1 g 100 mL/hr over 30 Minutes Intravenous 3 times per day 12/20/12 1741 12/26/12 0859   12/20/12 0900  [MAR Hold]  levofloxacin (LEVAQUIN) IVPB 750 mg  Status:  Discontinued     (On MAR Hold since 12/28/12 1705)   750 mg 100 mL/hr over 90 Minutes Intravenous Every 24 hours 12/20/12 0848 12/28/12 1802   12/18/12 2100  piperacillin-tazobactam (ZOSYN) IVPB 3.375 g  Status:  Discontinued     3.375 g 12.5 mL/hr over 240 Minutes Intravenous Every 8 hours 12/18/12 1852 12/19/12 1554   12/18/12 2000  vancomycin (VANCOCIN) IVPB 1000 mg/200 mL premix  Status:  Discontinued     1,000 mg 200 mL/hr over 60 Minutes Intravenous Every 8 hours 12/18/12 1852 12/19/12 1554   12/15/12 0800  ceFAZolin (ANCEF) IVPB 1 g/50 mL premix     1 g 100 mL/hr over 30 Minutes Intravenous Every 8 hours 12/15/12 0413 12/15/12 1604       Best Practice/Protocols:  VTE Prophylaxis: Mechanical Intermittent Sedation  Consults: Treatment Team:  Trauma Md, MD Kym Groom, MD    Studies:    Events:  Subjective:    Overnight Issues:   Objective:  Vital signs for last 24 hours: Temp:  [98.9 F (37.2 C)-99.8 F (37.7 C)] 98.9 F (37.2 C) (04/22 0400) Pulse Rate:  [75-100] 90 (04/22 0601) Resp:  [10-28] 17 (04/22 0600) BP: (110-156)/(64-97) 135/87 mmHg (04/22 0601) SpO2:  [99 %-100 %] 99 % (04/22 0600) FiO2 (%):  [30 %-40 %] 30 % (04/22 0500) Weight:  [90.5 kg (199 lb 8.3 oz)] 90.5 kg (199 lb 8.3 oz) (04/22 0454)  Hemodynamic parameters for last 24 hours:    Intake/Output from previous day: 04/21 0701 - 04/22 0700 In: 4907.1 [I.V.:2017.1; NG/GT:2480; IV Piggyback:300] Out: 1955 [Urine:1740; Drains:215]  Intake/Output this shift:    Vent settings for last 24 hours: Vent  Mode:  [-] PRVC FiO2 (%):  [30 %-40 %] 30 % Set Rate:  [16 bmp] 16 bmp Vt Set:  [600 mL] 600 mL PEEP:  [5 cmH20] 5 cmH20 Pressure Support:  [5 cmH20] 5 cmH20 Plateau Pressure:  [12 cmH20-15 cmH20] 15 cmH20  Physical Exam:  General: on vent Neuro: arouses to voice, pupils equal and reactive, spontaneous agitative movement left upper and lower extremity, not following commands HEENT/Neck: ETT Resp: clear to auscultation bilaterally CVS: regular rate and rhythm GI: soft, nontender, nondistended, bone flap right abdomen, dressing left abdomen, positive bowel sounds Extremities: Calves soft  Results for orders placed during the hospital encounter of 12/14/12 (from the past 24 hour(s))  GLUCOSE, CAPILLARY     Status: Abnormal   Collection Time    12/30/12  8:18 AM      Result Value Range   Glucose-Capillary 121 (*) 70 - 99 mg/dL  BLOOD GAS, ARTERIAL     Status: Abnormal   Collection Time    12/30/12 10:00 AM      Result Value Range   FIO2 0.30     Delivery systems VENTILATOR     Mode  CONTINUOUS POSITIVE AIRWAY PRESSURE     Peep/cpap 5.0     Pressure support 5.0     pH, Arterial 7.446  7.350 - 7.450   pCO2 arterial 33.7 (*) 35.0 - 45.0 mmHg   pO2, Arterial 197.0 (*) 80.0 - 100.0 mmHg   Bicarbonate 22.8  20.0 - 24.0 mEq/L   TCO2 23.8  0 - 100 mmol/L   Acid-base deficit 0.7  0.0 - 2.0 mmol/L   O2 Saturation 99.3     Patient temperature 99.1     Collection site LEFT RADIAL     Drawn by [id number]     Sample type ARTERIAL DRAW     Allens test (pass/fail) PASS  PASS  CBC     Status: Abnormal   Collection Time    12/30/12 11:03 AM      Result Value Range   WBC 11.8 (*) 4.0 - 10.5 K/uL   RBC 3.13 (*) 4.22 - 5.81 MIL/uL   Hemoglobin 9.1 (*) 13.0 - 17.0 g/dL   HCT 09.8 (*) 11.9 - 14.7 %   MCV 84.3  78.0 - 100.0 fL   MCH 29.1  26.0 - 34.0 pg   MCHC 34.5  30.0 - 36.0 g/dL   RDW 82.9  56.2 - 13.0 %   Platelets 262  150 - 400 K/uL  BASIC METABOLIC PANEL     Status: Abnormal    Collection Time    12/30/12 11:03 AM      Result Value Range   Sodium 146 (*) 135 - 145 mEq/L   Potassium 3.9  3.5 - 5.1 mEq/L   Chloride 113 (*) 96 - 112 mEq/L   CO2 27  19 - 32 mEq/L   Glucose, Bld 135 (*) 70 - 99 mg/dL   BUN 28 (*) 6 - 23 mg/dL   Creatinine, Ser 8.65  0.50 - 1.35 mg/dL   Calcium 8.6  8.4 - 78.4 mg/dL   GFR calc non Af Amer >90  >90 mL/min   GFR calc Af Amer >90  >90 mL/min  GLUCOSE, CAPILLARY     Status: Abnormal   Collection Time    12/30/12 11:48 AM      Result Value Range   Glucose-Capillary 122 (*) 70 - 99 mg/dL  GLUCOSE, CAPILLARY     Status: Abnormal   Collection Time    12/30/12  3:55 PM      Result Value Range   Glucose-Capillary 105 (*) 70 - 99 mg/dL  GLUCOSE, CAPILLARY     Status: None   Collection Time    12/30/12  7:42 PM      Result Value Range   Glucose-Capillary 97  70 - 99 mg/dL   Comment 1 Notify RN     Comment 2 Documented in Chart    GLUCOSE, CAPILLARY     Status: Abnormal   Collection Time    12/30/12 11:59 PM      Result Value Range   Glucose-Capillary 109 (*) 70 - 99 mg/dL   Comment 1 Notify RN    GLUCOSE, CAPILLARY     Status: Abnormal   Collection Time    12/31/12  4:11 AM      Result Value Range   Glucose-Capillary 140 (*) 70 - 99 mg/dL   Comment 1 Notify RN    CBC     Status: Abnormal   Collection Time    12/31/12  5:00 AM      Result Value Range   WBC 8.7  4.0 - 10.5 K/uL   RBC 2.92 (*) 4.22 - 5.81 MIL/uL   Hemoglobin 8.4 (*) 13.0 - 17.0 g/dL   HCT 65.7 (*) 84.6 - 96.2 %   MCV 84.6  78.0 - 100.0 fL   MCH 28.8  26.0 - 34.0 pg   MCHC 34.0  30.0 - 36.0 g/dL   RDW 95.2  84.1 - 32.4 %   Platelets 262  150 - 400 K/uL    Assessment & Plan: Present on Admission:  . Respiratory failure following trauma and surgery . Cardiac arrest . Elevated ETOH level   LOS: 17 days   Additional comments:I reviewed the patient's new clinical lab test results. and chest x-ray Fall  TBI s/p decompressive craniectomy x2 -- per  NS Facial abrasions -- Local care  ABL anemia -- Drifting gradually down, no requirement for transfusion at this time VDRF -- Continue support , Possible tracheostomy today FEN -- Tolerating TF. Hypernatremia improving, percutaneous endoscopic gastrostomy tube placement at the bedside today VTE -- SCD's  Critical Care Total Time*: 45 Minutes I spoke at length with the patient's wife, Savas Elvin.  We discussed the risks, benefits, and procedures of percutaneous endoscopic gastrostomy tube placement at bedside tracheostomy. She is agreeable to the PEG tube placement. She is considering the tracheostomy. If she agrees, we will proceed with both today. In any case we will place the PEG tube at the bedside today. Violeta Gelinas, MD, MPH, FACS Pager: [phone]  12/31/2012  *Care during the described time interval was provided by me and/or other providers on the critical care team.  I have reviewed this patient's available data, including medical history, events of note, physical examination and test results as part of my evaluation.

## 2012-12-31 NOTE — Progress Notes (Signed)
Subjective: No subclinical seizures on 4 hour EEG yesterday.  No clinical seizures  Exam: Filed Vitals:   12/31/12 0900  BP: 119/70  Pulse: 73  Temp:   Resp: 16   Gen: In bed, NAD MS: Awake to mild stimulus. does nto follow commands.  WU:JWJXB, does not fixate or track, eyes slightly dysconjugate, but they conjugate when aroused.  Motor: withdraws all extremities to pain, though less on the right LE.  Sensory:reponds to nox stim x4.   Impression: 37 yo M with seizures after SDH and possible cardiac arrest. His angio did show some displacement of the MCA and his MRI is suggestive of ischemia of left hemisphere. I suspect that a combination of hypotension from arrest with mass effect from bleed could have caused ischemia of these areas. There is a possibility that seizure can cause changes like this, but I feel that this is less likely.   Recommendations: 1) Continue AEDs at current doses  Ritta Slot, MD [facility] [phone]  If 7pm- 7am, please page neurology on call at [phone].

## 2012-12-31 NOTE — Progress Notes (Signed)
Orthopedic Tech Progress Note Patient Details:  Albert [name] 21-Jul-1976 [id number]  Patient ID: [name], male   DOB: 10-15-1975, 37 y.o.   MRN: [id number] rn to apply abdominal binder  [name] 12/31/2012, 2:45 PM

## 2012-12-31 NOTE — Progress Notes (Signed)
UR Completed.  Inpatient rehab in [place] on hold due to recent events.  They are willing to reassess, as is [facility] inpt rehab, when pt stabilizes.

## 2013-01-01 ENCOUNTER — Inpatient Hospital Stay (HOSPITAL_COMMUNITY): Payer: BC Managed Care – PPO

## 2013-01-01 LAB — CBC
HCT: 23.9 % — ABNORMAL LOW (ref 39.0–52.0)
Hemoglobin: 8.3 g/dL — ABNORMAL LOW (ref 13.0–17.0)
MCH: 28.7 pg (ref 26.0–34.0)
MCHC: 34.7 g/dL (ref 30.0–36.0)
MCV: 82.7 fL (ref 78.0–100.0)
Platelets: 322 10*3/uL (ref 150–400)
RBC: 2.89 MIL/uL — ABNORMAL LOW (ref 4.22–5.81)
RDW: 12.3 % (ref 11.5–15.5)
WBC: 10.8 10*3/uL — ABNORMAL HIGH (ref 4.0–10.5)

## 2013-01-01 LAB — BASIC METABOLIC PANEL
BUN: 10 mg/dL (ref 6–23)
CO2: 25 mEq/L (ref 19–32)
Calcium: 7.7 mg/dL — ABNORMAL LOW (ref 8.4–10.5)
Chloride: 107 mEq/L (ref 96–112)
Creatinine, Ser: 0.52 mg/dL (ref 0.50–1.35)
GFR calc Af Amer: >90 mL/min (ref >90)
GFR calc non Af Amer: >90 mL/min (ref >90)
Glucose, Bld: 91 mg/dL (ref 70–99)
Potassium: 3.4 mEq/L — ABNORMAL LOW (ref 3.5–5.1)
Sodium: 140 mEq/L (ref 135–145)

## 2013-01-01 LAB — GLUCOSE, CAPILLARY
Glucose-Capillary: 113 mg/dL — ABNORMAL HIGH (ref 70–99)
Glucose-Capillary: 115 mg/dL — ABNORMAL HIGH (ref 70–99)
Glucose-Capillary: 93 mg/dL (ref 70–99)
Glucose-Capillary: 96 mg/dL (ref 70–99)
Glucose-Capillary: 98 mg/dL (ref 70–99)
Glucose-Capillary: 99 mg/dL (ref 70–99)

## 2013-01-01 MED ORDER — FREE WATER
200.0000 mL | Freq: Four times a day (QID) | Status: DC
  Administered 2013-01-01 – 2013-01-03 (×9): 200 mL

## 2013-01-01 MED ORDER — POTASSIUM CHLORIDE 20 MEQ/15ML (10%) PO LIQD
20.0000 meq | Freq: Two times a day (BID) | ORAL | Status: AC
  Administered 2013-01-01 (×2): 20 meq via ORAL
  Filled 2013-01-01 (×2): qty 15

## 2013-01-01 NOTE — Progress Notes (Signed)
Speech Language Pathology Treatment Patient Details Name: Albert [name] MRN: [id number] DOB: 09-Dec-1975 Today's Date: 01/01/2013 Time: 0915-1000 SLP Time Calculation (min): 45 min  Assessment / Plan / Recommendation Clinical Impression  Treatment focused on facilitation of cognitive recovery. SLP provided opportunities for increased response via auditory, tactile, and visual stimuli. Patient with localized response to auditory stimuli via eye opening when name called in 50% of opportunities. Continues to not blink to threat however eyes noted to move towards midline and to the right x1 today. ? visual deficits. Patient appropriately responded to basic biographical clinician questions in <25% of attempts today via mouthing words ("yes" and "what?"). Mouthing of curse words noted coinciding with facial grimacing and a significant increase in agitation/restlessness. 2-3 episodes of audible vocal cord phonation noted while spontaneously coughing. Cuff not deflated today due to just recently placed on trach collar. Patient presenting today with significant improvements in cognitive function with behaviors consistent with a Rancho Level IV (agitated, confused). Recommend continued SLP f/u with cuff deflation and possible PMSV trials on 4/24 pending MD order.     SLP Plan  Continue with current plan of care    Pertinent Vitals/Pain Grimacing/increased restlessness, agitation. RN aware.   SLP Goals  SLP Goals Potential to Achieve Goals: Good SLP Goal #1: Pt will follow one step commands with max verbal/tactile cues x3.  SLP Goal #1 - Progress: Progressing toward goal SLP Goal #2: Pt will sustain attention to basic functional task for 30 seconds with max contextual cues.  SLP Goal #2 - Progress: Progressing toward goal SLP Goal #3: Pt will verbalize at word level (following extubation ) with max verbal/contextual cues.  SLP Goal #3 - Progress: Progressing toward goal  General Temperature  Spikes Noted: No Respiratory Status: Trach (trach collar) Behavior/Cognition: Alert Oral Cavity - Dentition: Adequate natural dentition Patient Positioning: Upright in bed      Treatment Skilled Treatment: Treatment focused on facilitation of cognitive recovery. SLP provided opportunities for increased response via auditory, tactile, and visual stimuli. Patient with localized response to auditory stimuli via eye opening when name called in 50% of opportunities. Continues to not blink to threat however eyes noted to move towards midline and to the right x1 today. ? visual deficits. Patient appropriately responded to basic biographical clinician questions in <25% of attempts today via mouthing words ("yes" and "what?"). Mouthing of curse words noted coinciding with facial grimacing and a significant increase in agitation/restlessness. 2-3 episodes of audible vocal cord phonation noted while spontaneously coughing. Cuff not deflated today due to just recently placed on trach collar. Patient presenting today with significant improvements in cognitive function with behaviors consistent with a Rancho Level IV (agitated, confused). Recommend continued SLP f/u with cuff deflation and possible PMSV trials on 4/24 pending MD order.    GO   [name] MA, CCC-SLP [phone]   [name] [name] 01/01/2013, 10:34 AM

## 2013-01-01 NOTE — Progress Notes (Signed)
Physical Therapy Treatment Patient Details Name: Albert [name] MRN: [id number] DOB: [birth year] Today's Date: 01/01/2013 Time: 0913-1000 PT Time Calculation (min): 47 min  PT Assessment / Plan / Recommendation Comments on Treatment Session  pt presents with fall (Etoh related) resulting in fronto-temporo-parietal SDH post Crani with bone flap in R UQ.  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  pt also with seizure from L Intraparenchymal Hematoma s/p 2nd Crani with drain in place.  pt with increased restlessness and agitation today and demonstrates a Rancho IV level.  pt continues to grab for lines and tubes and today was mouthing words, cursing, and spitting.  pt continues to have only tonal type movement in R UE.      Follow Up Recommendations  CIR     Does the patient have the potential to tolerate intense rehabilitation     Barriers to Discharge        Equipment Recommendations  None recommended by PT    Recommendations for Other Services Rehab consult  Frequency Min 3X/week   Plan Discharge plan remains appropriate;Frequency remains appropriate    Precautions / Restrictions Precautions Precautions: Fall Precaution Comments: pt with L Crani with drain and bone flap in RT UQ.   Restrictions Weight Bearing Restrictions: No   Pertinent Vitals/Pain Indicated pain, but unable to indicate location.  RN aware.      Mobility  Bed Mobility Bed Mobility: Supine to Sit;Sitting - Scoot to [place] of Bed;Sit to Supine;Scooting to [facility] Supine to Sit: 1: +2 Total assist Supine to Sit: Patient Percentage: 30% Sitting - Scoot to Edge of Bed: 1: +2 Total assist Sitting - Scoot to Edge of Bed: Patient Percentage: 10% Sit to Supine: 1: +2 Total assist Sit to Supine: Patient Percentage: 40% Scooting to HOB: 1: +2 Total assist Scooting to [facility]: Patient Percentage: 10% Details for Bed Mobility Assistance: pt continues with increased restlessness and agitation.  pt  following some cues for mobility task, but needs A initiating.   Transfers Transfers: Not assessed Details for Transfer Assistance: When asked to stand needed direction repeated twice, then pt mouthed the word "No".   Ambulation/Gait Ambulation/Gait Assistance: Not tested (comment) Stairs: No Wheelchair Mobility Wheelchair Mobility: No    Exercises     PT Diagnosis:    PT Problem List:   PT Treatment Interventions:     PT Goals Acute Rehab PT Goals Time For Goal Achievement: 01/02/13 Potential to Achieve Goals: Good PT Goal: Supine/Side to Sit - Progress: Progressing toward goal PT Goal: Sit at Edge Of Bed - Progress: Progressing toward goal PT Goal: Sit to Supine/Side - Progress: Progressing toward goal Additional Goals PT Goal: Additional Goal #1 - Progress: Progressing toward goal PT Goal: Additional Goal #2 - Progress: Progressing toward goal  Visit Information  Last PT Received On: 01/01/13 Assistance Needed: +3 or more PT/OT Co-Evaluation/Treatment: Yes    Subjective Data  Subjective: pt mouthing single words.  "What?", "Yes", "No", and curse words.     Cognition  Cognition Arousal/Alertness: Awake/alert Behavior During Therapy: Agitated Overall Cognitive Status: Difficult to assess Area of Impairment: Rancho level Current Attention Level: Focused Following Commands: Follows one step commands inconsistently General Comments: pt restless and agitated througout session.  pt responding to more simple directions today and answered some simple yes/no questions.  pt with bouts of coughing while sitting requiring occasional suctioning.  pt's gaze able to pass midline to R side today without demonstrating drift noted in  previous sessions.  pt attempting to grab at lines and trach collar with L UE only along with continued cursing and occasionally spitting.   Difficult to assess due to: Tracheostomy Programmer, applications today) Rancho Levels of Cognitive Functioning Rancho [place]  Amigos Scales of Cognitive Functioning: Confused/agitated    Balance  Balance Balance Assessed: Yes Static Sitting Balance Static Sitting - Balance Support: Left upper extremity supported;Feet supported Static Sitting - Level of Assistance: 4: Min assist;1: +2 Total assist Static Sitting - Comment/# of Minutes: pt fluctuates between a Total A and MinA to maitnain sitting balance pending level of agitation while sitting.  pt sat EOB >36mins demonstrating strong trunk control during agitated movements.    End of Session PT - End of Session Equipment Utilized During Treatment: Oxygen (Trach Collar) Activity Tolerance: Treatment limited secondary to agitation Patient left: in bed;with call bell/phone within reach;with restraints reapplied Nurse Communication: Mobility status   GP     Albert [name], [place] [phone] 01/01/2013, 11:08 AM

## 2013-01-01 NOTE — Progress Notes (Signed)
Patient ID: Albert [name], male   DOB: 1976/06/21, 37 y.o.   MRN: [id number] Mouthing words? According to his wife. Trach and prg in place

## 2013-01-01 NOTE — Progress Notes (Signed)
Patient ID: Albert [name], male   DOB: 11/10/1975, 37 y.o.   MRN: [id number] More awake, more focus. F/c&.

## 2013-01-01 NOTE — Progress Notes (Signed)
Pt was awake and very active during most of my visit. [name] and brother were bedside when I first arrived. Pt's parents joined [place]. Had prayer w/pt and family and provided continued listening and emotional support as needed. Marjory Lies Chaplain

## 2013-01-01 NOTE — Progress Notes (Addendum)
Patient ID: Albert [name], male   DOB: [birth year], 38 y.o.   MRN: [id number] Follow up - Trauma Critical Care  Patient Details:    Albert [name] is an 37 y.o. male.  Lines/tubes : Airway 8 mm (Active)  Secured at (cm) 24 cm 12/31/2012  8:56 AM  Measured From Lips 12/31/2012  8:56 AM  Secured Location Center 01/01/2013  3:17 AM  Secured By [employer] 12/31/2012  8:56 AM  Tube Holder Repositioned Yes 12/31/2012  8:56 AM  Cuff Pressure (cm H2O) 26 cm H2O 12/31/2012  7:40 PM  Site Condition Dry 12/31/2012 11:16 PM     PICC / Midline Double Lumen 12/19/12 PICC Left Basilic (Active)  Indication for Insertion or Continuance of Line Prolonged intravenous therapies 12/31/2012  8:00 PM  Site Assessment Clean;Dry;Intact 12/31/2012  8:00 PM  Lumen #1 Status Infusing 12/31/2012  8:00 PM  Lumen #2 Status Infusing 12/31/2012  8:00 PM  Dressing Type Transparent;Occlusive 12/31/2012  8:00 PM  Dressing Status Clean;Dry;Intact 12/31/2012  8:00 PM  Line Care Connections checked and tightened 12/31/2012  8:00 PM  Dressing Intervention Dressing reinforced 01/01/2013  3:00 AM  Dressing Change Due 01/01/13 01/01/2013  3:00 AM     Closed System Drain Left Bulb (JP) (Active)  Site Description Leaking at site 12/31/2012  8:00 PM  Dressing Status None 12/31/2012  8:00 PM  Drainage Appearance Serosanguineous 12/31/2012  8:00 PM  Status To suction (Charged) 12/31/2012  8:00 PM  Output (mL) 50 mL 01/01/2013  5:00 AM     Urethral Catheter 16 Fr. (Active)  Indication for Insertion or Continuance of Catheter Acute urinary retention;Urinary output monitoring;Prolonged immobilization 12/31/2012  8:00 PM  Site Assessment Clean;Intact;Dry 12/31/2012  8:00 PM  Collection Container Standard drainage bag 12/31/2012  8:00 PM  Securement Method Leg strap 12/31/2012  8:00 PM  Urinary Catheter Interventions Unclamped 12/31/2012  8:00 PM    Microbiology/Sepsis markers: Results for orders placed during the hospital  encounter of 12/14/12  CULTURE, BLOOD (ROUTINE X 2)     Status: None   Collection Time    12/16/12 10:50 AM      Result Value Range Status   Specimen Description BLOOD LEFT HAND   Final   Special Requests BOTTLES DRAWN AEROBIC ONLY 3CC   Final   Culture  Setup Time 12/16/2012 14:21   Final   Culture NO GROWTH 5 DAYS   Final   Report Status 12/22/2012 FINAL   Final  CULTURE, BLOOD (ROUTINE X 2)     Status: None   Collection Time    12/16/12 10:58 AM      Result Value Range Status   Specimen Description BLOOD RIGHT ARM   Final   Special Requests BOTTLES DRAWN AEROBIC AND ANAEROBIC 10CC   Final   Culture  Setup Time 12/16/2012 14:21   Final   Culture NO GROWTH 5 DAYS   Final   Report Status 12/22/2012 FINAL   Final  URINE CULTURE     Status: None   Collection Time    12/16/12 12:03 PM      Result Value Range Status   Specimen Description URINE, CATHETERIZED   Final   Special Requests Normal   Final   Culture  Setup Time 12/16/2012 12:46   Final   Colony Count NO GROWTH   Final   Culture NO GROWTH   Final   Report Status 12/17/2012 FINAL   Final  CULTURE, RESPIRATORY (NON-EXPECTORATED)     Status: None  Collection Time    12/16/12  4:07 PM      Result Value Range Status   Specimen Description TRACHEAL ASPIRATE   Final   Special Requests NONE   Final   Gram Stain     Final   Value: ABUNDANT WBC PRESENT, PREDOMINANTLY PMN     RARE SQUAMOUS EPITHELIAL CELLS PRESENT     MODERATE GRAM NEGATIVE COCCI     MODERATE GRAM POSITIVE COCCI     IN PAIRS   Culture     Final   Value: ABUNDANT STREPTOCOCCUS PNEUMONIAE     ABUNDANT HAEMOPHILUS INFLUENZAE     Note: Beta Lactamase Positive     ABUNDANT MORAXELLA CATARRHALIS(BRANHAMELLA)     Note: BETA LACTAMASE POSITIVE   Report Status 12/20/2012 FINAL   Final   Organism ID, Bacteria STREPTOCOCCUS PNEUMONIAE   Final  CULTURE, BLOOD (ROUTINE X 2)     Status: None   Collection Time    12/28/12  2:10 PM      Result Value Range Status    Specimen Description BLOOD RIGHT ARM   Final   Special Requests BOTTLES DRAWN AEROBIC ONLY 10 CC   Final   Culture  Setup Time 12/28/2012 19:58   Final   Culture     Final   Value:        BLOOD CULTURE RECEIVED NO GROWTH TO DATE CULTURE WILL BE HELD FOR 5 DAYS BEFORE ISSUING A FINAL NEGATIVE REPORT   Report Status PENDING   Incomplete  CULTURE, BLOOD (ROUTINE X 2)     Status: None   Collection Time    12/28/12  2:20 PM      Result Value Range Status   Specimen Description BLOOD RIGHT HAND   Final   Special Requests BOTTLES DRAWN AEROBIC ONLY 10CC   Final   Culture  Setup Time 12/28/2012 19:58   Final   Culture     Final   Value:        BLOOD CULTURE RECEIVED NO GROWTH TO DATE CULTURE WILL BE HELD FOR 5 DAYS BEFORE ISSUING A FINAL NEGATIVE REPORT   Report Status PENDING   Incomplete  URINE CULTURE     Status: None   Collection Time    12/28/12  3:40 PM      Result Value Range Status   Specimen Description URINE, CATHETERIZED   Final   Special Requests NONE   Final   Culture  Setup Time 12/28/2012 21:12   Final   Colony Count NO GROWTH   Final   Culture NO GROWTH   Final   Report Status 12/29/2012 FINAL   Final  CULTURE, RESPIRATORY (NON-EXPECTORATED)     Status: None   Collection Time    12/28/12 11:33 PM      Result Value Range Status   Specimen Description TRACHEAL ASPIRATE   Final   Special Requests NONE   Final   Gram Stain     Final   Value: FEW WBC PRESENT, PREDOMINANTLY PMN     RARE SQUAMOUS EPITHELIAL CELLS PRESENT     FEW GRAM POSITIVE COCCI IN PAIRS     RARE GRAM POSITIVE RODS   Culture Non-Pathogenic Oropharyngeal-type Flora Isolated.   Final   Report Status 12/31/2012 FINAL   Final    Anti-infectives:  Anti-infectives   Start     Dose/Rate Route Frequency Ordered Stop   12/31/12 1230  ciprofloxacin (CIPRO) IVPB 400 mg     400 mg 200 mL/hr over 60  Minutes Intravenous  Once 12/30/12 1524 12/31/12 1411   12/31/12 1230  ceFAZolin (ANCEF) IVPB 2 g/50 mL premix      2 g 100 mL/hr over 30 Minutes Intravenous  Once 12/31/12 0748 12/31/12 1301   12/28/12 2200  ceFAZolin (ANCEF) IVPB 1 g/50 mL premix     1 g 100 mL/hr over 30 Minutes Intravenous 3 times per day 12/28/12 2003 12/29/12 1458   12/28/12 1646  ceFAZolin (ANCEF) 2-3 GM-% IVPB SOLR    Comments:  DAY, DORY: cabinet override      12/28/12 1646 12/29/12 0459   12/20/12 1745  ceFEPIme (MAXIPIME) 1 g in dextrose 5 % 50 mL IVPB  Status:  Discontinued     1 g 100 mL/hr over 30 Minutes Intravenous 3 times per day 12/20/12 1741 12/26/12 0859   12/20/12 0900  [MAR Hold]  levofloxacin (LEVAQUIN) IVPB 750 mg  Status:  Discontinued     (On MAR Hold since 12/28/12 1705)   750 mg 100 mL/hr over 90 Minutes Intravenous Every 24 hours 12/20/12 0848 12/28/12 1802   12/18/12 2100  piperacillin-tazobactam (ZOSYN) IVPB 3.375 g  Status:  Discontinued     3.375 g 12.5 mL/hr over 240 Minutes Intravenous Every 8 hours 12/18/12 1852 12/19/12 1554   12/18/12 2000  vancomycin (VANCOCIN) IVPB 1000 mg/200 mL premix  Status:  Discontinued     1,000 mg 200 mL/hr over 60 Minutes Intravenous Every 8 hours 12/18/12 1852 12/19/12 1554   12/15/12 0800  ceFAZolin (ANCEF) IVPB 1 g/50 mL premix     1 g 100 mL/hr over 30 Minutes Intravenous Every 8 hours 12/15/12 0413 12/15/12 1604      Best Practice/Protocols:  VTE Prophylaxis: Mechanical Intermittent Sedation  Consults: Treatment Team:  Trauma Md, MD Kym Groom, MD    Studies:    Events:  Subjective:    Overnight Issues:   Objective:  Vital signs for last 24 hours: Temp:  [98.6 F (37 C)-100.1 F (37.8 C)] 99.1 F (37.3 C) (04/23 0359) Pulse Rate:  [72-112] 85 (04/23 0800) Resp:  [9-24] 18 (04/23 0800) BP: (105-167)/(63-104) 119/85 mmHg (04/23 0800) SpO2:  [95 %-100 %] 100 % (04/23 0800) FiO2 (%):  [30 %] 30 % (04/23 0317) Weight:  [88.5 kg (195 lb 1.7 oz)] 88.5 kg (195 lb 1.7 oz) (04/23 0500)  Hemodynamic parameters for last 24 hours:     Intake/Output from previous day: 04/22 0701 - 04/23 0700 In: 2999 [I.V.:2449; IV Piggyback:550] Out: 2805 [Urine:2675; Drains:130]  Intake/Output this shift: Total I/O In: 198.3 [I.V.:98.3; IV Piggyback:100] Out: 250 [Urine:250]  Vent settings for last 24 hours: Vent Mode:  [-] PRVC FiO2 (%):  [30 %] 30 % Set Rate:  [16 bmp] 16 bmp Vt Set:  [600 mL] 600 mL PEEP:  [5 cmH20] 5 cmH20 Plateau Pressure:  [14 cmH20-17 cmH20] 17 cmH20  Physical Exam:  General: awake Neuro: PERL, spont moving LUE, LLE, looks to voice and grimaces HEENT/Neck: trach-clean, intact Resp: clear to auscultation bilaterally CVS: RRR GI: soft, NT, ND, PEG in place Extremities: no edema  Results for orders placed during the hospital encounter of 12/14/12 (from the past 24 hour(s))  GLUCOSE, CAPILLARY     Status: None   Collection Time    12/31/12 11:48 AM      Result Value Range   Glucose-Capillary 85  70 - 99 mg/dL  GLUCOSE, CAPILLARY     Status: Abnormal   Collection Time    12/31/12  3:53 PM  Result Value Range   Glucose-Capillary 103 (*) 70 - 99 mg/dL  GLUCOSE, CAPILLARY     Status: None   Collection Time    12/31/12  7:37 PM      Result Value Range   Glucose-Capillary 79  70 - 99 mg/dL   Comment 1 Notify RN     Comment 2 Documented in Chart    GLUCOSE, CAPILLARY     Status: None   Collection Time    01/01/13 12:21 AM      Result Value Range   Glucose-Capillary 99  70 - 99 mg/dL  GLUCOSE, CAPILLARY     Status: None   Collection Time    01/01/13  4:01 AM      Result Value Range   Glucose-Capillary 93  70 - 99 mg/dL  CBC     Status: Abnormal   Collection Time    01/01/13  5:15 AM      Result Value Range   WBC 10.8 (*) 4.0 - 10.5 K/uL   RBC 2.89 (*) 4.22 - 5.81 MIL/uL   Hemoglobin 8.3 (*) 13.0 - 17.0 g/dL   HCT 78.2 (*) 95.6 - 21.3 %   MCV 82.7  78.0 - 100.0 fL   MCH 28.7  26.0 - 34.0 pg   MCHC 34.7  30.0 - 36.0 g/dL   RDW 08.6  57.8 - 46.9 %   Platelets 322  150 - 400 K/uL   BASIC METABOLIC PANEL     Status: Abnormal   Collection Time    01/01/13  5:15 AM      Result Value Range   Sodium 140  135 - 145 mEq/L   Potassium 3.4 (*) 3.5 - 5.1 mEq/L   Chloride 107  96 - 112 mEq/L   CO2 25  19 - 32 mEq/L   Glucose, Bld 91  70 - 99 mg/dL   BUN 10  6 - 23 mg/dL   Creatinine, Ser 6.29  0.50 - 1.35 mg/dL   Calcium 7.7 (*) 8.4 - 10.5 mg/dL   GFR calc non Af Amer >90  >90 mL/min   GFR calc Af Amer >90  >90 mL/min  GLUCOSE, CAPILLARY     Status: None   Collection Time    01/01/13  8:15 AM      Result Value Range   Glucose-Capillary 96  70 - 99 mg/dL    Assessment & Plan: Present on Admission:  . Respiratory failure following trauma and surgery . Cardiac arrest . Elevated ETOH level   LOS: 18 days   Additional comments:I reviewed the patient's new clinical lab test results. . Fall  TBI s/p decompressive craniectomy x2 -- per NS Facial abrasions -- Local care  ABL anemia -- stabilizing VDRF -- HTC as tolerated FEN -- resume TF, decrease free water as Na better, replace hypokalemia VTE -- SCD's   Critical Care Total Time*: 31 Minutes  Violeta Gelinas, MD, MPH, FACS Pager: [phone]  01/01/2013  *Care during the described time interval was provided by me and/or other providers on the critical care team.  I have reviewed this patient's available data, including medical history, events of note, physical examination and test results as part of my evaluation.

## 2013-01-01 NOTE — Progress Notes (Signed)
Occupational Therapy Treatment Patient Details Name: Albert [name] MRN: [id number] DOB: [birth year] Today's Date: 01/01/2013 Time: 0912-0959 OT Time Calculation (min): 47 min  OT Assessment / Plan / Recommendation Comments on Treatment Session 37 yo s/p fall (ETOH) with Lt fronto-temporo parietal SDH with cranitomy ( bone flap in RT LQ of abdomen) PT with multiple infarcts noted on MRI. Question anoxic brain injury. Pt with new onset of seizures and follow up scans reveal hematoma and pt with second cranitomy evacuation . Pt now with pending peg and trach placement. PT presents as Rancho IV this session. Wife present throughout session    Follow Up Recommendations  CIR    Barriers to Discharge       Equipment Recommendations   (TBA)    Recommendations for Other Services Rehab consult  Frequency Min 3X/week   Plan Discharge plan remains appropriate    Precautions / Restrictions Precautions Precautions: Fall Precaution Comments: pt with L Crani with drain and bone flap in RT UQ.  trach peg and abdominal binder Restrictions Weight Bearing Restrictions: No   Pertinent Vitals/Pain     ADL  Eating/Feeding: NPO ADL Comments: Pt sitting eob and mouthing "what" to name call. pt mouthing 4 letter curse words at EOB . pt asked by therapist "are you hurting " pt nods "yes" . Pt's attention focused and unable to respond at this time to "what hurts [name]". Pt grabbing pulling spitting and restless demonstrates Rancho Coma recovery IV. Pt with eyes at midline on arrival. pt attending to left side > than Right side. Pt pausing restless behavior to wife voice and slight turn of the head to the right but not completely. Pt localized to sound but not visually. Pt provided ice chips and refusing by stating "NO" with mouthing. Pt required (A) to maintain EOB sitting. Pt tolerating trach collar well during session. pt localized to objects in mouth (question if skin from prolonged vent) and pitting  out. Removal of objects from mouth appropriate and purposeful.      OT Diagnosis:    OT Problem List:   OT Treatment Interventions:     OT Goals Acute Rehab OT Goals OT Goal Formulation: With family Time For Goal Achievement: 01/15/13 Potential to Achieve Goals: Good ADL Goals Pt Will Perform Grooming: with max assist;Supported;Sitting, edge of bed;with cueing (comment type and amount) ADL Goal: Grooming - Progress: Progressing toward goals Miscellaneous OT Goals Miscellaneous OT Goal #1: Pt will follow 1 step simple command 2 out 5 trialls OT Goal: Miscellaneous Goal #1 - Progress: Progressing toward goals Miscellaneous OT Goal #2: Pt will demonstrate localized response 3 out 4 trials OT Goal: Miscellaneous Goal #2 - Progress: Met Miscellaneous OT Goal #3: Pt will tolerate EOB sitting ~5 minutes with max (A) OT Goal: Miscellaneous Goal #3 - Progress: Progressing toward goals Miscellaneous OT Goal #4: Pt will complete SIT<>STand (basic transfer) total +2 50% OT Goal: Miscellaneous Goal #4 - Progress: Goal set today  Visit Information  Last OT Received On: 01/01/13 Assistance Needed: +3 or more PT/OT Co-Evaluation/Treatment: Yes    Subjective Data      Prior Functioning       Cognition  Cognition Arousal/Alertness: Awake/alert Behavior During Therapy: Agitated Overall Cognitive Status: Difficult to assess Area of Impairment: Rancho level Current Attention Level: Focused Following Commands: Follows one step commands inconsistently General Comments: pt restless and agitated througout session.  pt responding to more simple directions today and answered some simple yes/no questions.  pt with bouts of  coughing while sitting requiring occasional suctioning.  pt's gaze able to pass midline to R side today without demonstrating drift noted in previous sessions.  pt attempting to grab at lines and trach collar with L UE only along with continued cursing and occasionally spitting.    Difficult to assess due to: Tracheostomy Programmer, applications today) Rancho Levels of Cognitive Functioning Rancho Los Amigos Scales of Cognitive Functioning: Confused/agitated    Mobility  Bed Mobility Bed Mobility: Supine to Sit;Sitting - Scoot to [place] of Bed;Sit to Supine;Scooting to [facility] Supine to Sit: 1: +2 Total assist Supine to Sit: Patient Percentage: 30% Sitting - Scoot to Edge of Bed: 1: +2 Total assist Sitting - Scoot to Edge of Bed: Patient Percentage: 10% Sit to Supine: 1: +2 Total assist Sit to Supine: Patient Percentage: 40% Scooting to HOB: 1: +2 Total assist Scooting to [facility]: Patient Percentage: 10% Details for Bed Mobility Assistance: pt continues with increased restlessness and agitation.  pt following some cues for mobility task, but needs A initiating.   Transfers Details for Transfer Assistance: When asked to stand needed direction repeated twice, then pt mouthed the word "No".      Exercises      Balance Balance Balance Assessed: Yes Static Sitting Balance Static Sitting - Balance Support: Left upper extremity supported;Feet supported Static Sitting - Level of Assistance: 4: Min assist;1: +2 Total assist Static Sitting - Comment/# of Minutes: pt fluctuates between a Total A and MinA to maitnain sitting balance pending level of agitation while sitting. pt sat EOB >46mins demonstrating strong trunk control during agitated movements   End of Session OT - End of Session Activity Tolerance: Patient tolerated treatment well Patient left: in bed;with call bell/phone within reach;with family/visitor present Nurse Communication: Mobility status;Precautions;Need for lift equipment  GO     Lucile Shutters 01/01/2013, 4:32 PM Pager: [phone]

## 2013-01-01 NOTE — Progress Notes (Signed)
NEURO HOSPITALIST PROGRESS NOTE   SUBJECTIVE:                                                                                                                        No further clinical seizures reported. He open eyes to verbal commands but doesn't interact and falls back to sleep. On IV dilantin, keppra, and vimpat.   OBJECTIVE:                                                                                                                           Vital signs in last 24 hours: Temp:  [98.6 F (37 C)-100.1 F (37.8 C)] 99.2 F (37.3 C) (04/23 0800) Pulse Rate:  [72-112] 100 (04/23 1000) Resp:  [9-30] 30 (04/23 1000) BP: (105-167)/(63-116) 149/95 mmHg (04/23 1000) SpO2:  [95 %-100 %] 97 % (04/23 1000) FiO2 (%):  [30 %-40 %] 40 % (04/23 0900) Weight:  [88.5 kg (195 lb 1.7 oz)] 88.5 kg (195 lb 1.7 oz) (04/23 0500)  Intake/Output from previous day: 04/22 0701 - 04/23 0700 In: 2999 [I.V.:2449; IV Piggyback:550] Out: 2805 [Urine:2675; Drains:130] Intake/Output this shift: Total I/O In: 443.3 [I.V.:258.3; Other:60; IV Piggyback:125] Out: 750 [Urine:750] Nutritional status: NPO  Past Medical History  Diagnosis Date  . Headache       Neurologic Exam:  Gen: In bed, NAD. Trach in place. MS: open eyes to verbal commands but doesn't follow commands.  ZO:XWRUE, does not fixate or track, eyes slightly dysconjugate, but they conjugate when aroused.  Motor: withdraws all extremities to pain, though less on the right LE.  Sensory:reponds to nox stim x4.    Lab Results: No results found for this basename: cbc, bmp, coags, chol, tri, ldl, hga1c   Lipid Panel No results found for this basename: CHOL, TRIG, HDL, CHOLHDL, VLDL, LDLCALC,  in the last 72 hours  Studies/Results: Ct Head Wo Contrast  12/31/2012  *RADIOLOGY REPORT*  Clinical Data: Headache.  Follow-up hemorrhage.  History of fall.  CT HEAD WITHOUT CONTRAST  Technique:  Contiguous  axial images were obtained from the base of the skull through the vertex without contrast.  Comparison: CT 12/29/2012 per  Findings: Large craniectomy defect left frontal parietal bone. There is a drain in  the subcutaneous tissues with satisfactory drainage of subcutaneous fluid collection.  There remains edema and gas in the subcutaneous tissues.  There is edema in the left parietal lobe which protrudes through the craniectomy defect.  Cerebral edema on the left   appears to have progressed since the prior study.  Small interhemispheric subdural hematoma posteriorly is unchanged. Mild tentorial subdural hematoma on the left also unchanged.  Ventricle size is normal.  Improvement in midline shift which is now 1 mm toward the right.  Hypodensity left anterior thalamus is more prominent and is most likely an area of acute infarct.  IMPRESSION: Progression of edema and swelling in the left parietal lobe with progressive protrusion into the craniectomy defect.  Improvement in midline shift now measuring 1 mm toward the right.  Mild subdural hematoma, stable.  Left thalamic hypodensity is slightly larger consistent with acute infarct.   Original Report Authenticated By: Janeece Riggers, M.D.    Dg Chest Port 1 View  01/01/2013  *RADIOLOGY REPORT*  Clinical Data: Evaluate tracheostomy.  PORTABLE CHEST - 1 VIEW  Comparison: 12/31/2012.  Findings: Tracheostomy appears unchanged.  Left upper extremity PICC is present, with the tip at the cavoatrial junction. Expiratory film.  This accentuates the position of the left upper extremity PICC tip.  Basilar atelectasis.  No airspace disease.  IMPRESSION: Unchanged support apparatus.  Low volume chest.   Original Report Authenticated By: Andreas Newport, M.D.    Dg Chest Port 1 View  12/31/2012  *RADIOLOGY REPORT*  Clinical Data: Post tracheostomy, traumatic brain injury  PORTABLE CHEST - 1 VIEW  Comparison: Portable exam 1353 hours compared to 12/31/2012  Findings: Tip of  tracheostomy tube projects over tracheal air column 4.5 cm above carina. Left arm PICC line tip projects over cavoatrial junction. Upper normal heart size. Normal mediastinal contours and pulmonary vascularity. Slightly decreased lung volumes with lungs grossly clear. No pleural effusion or pneumothorax.  IMPRESSION: Tracheostomy tube position as above. No acute abnormalities.   Original Report Authenticated By: Ulyses Southward, M.D.    Dg Chest Port 1 View  12/31/2012  *RADIOLOGY REPORT*  Clinical Data: Evaluate endotracheal tube placement.  PORTABLE CHEST - 1 VIEW  Comparison: Chest x-ray 12/30/2012.  Findings: An endotracheal tube is in place with tip 3.9 cm above the carina. There is a left upper extremity PICC with tip terminating in the at least the right atrium (the tip of the catheter extends below the lower margin of the image), likely accentuated by kyphotic positioning. The left upper extremity PICC could be withdrawn approximately3 cm for more optimal placement. Lung volumes are low.  No definite acute consolidative airspace disease.  No pleural effusions.  Mild pulmonary venous congestion, accentuated by low lung volumes, without frank pulmonary edema. Heart size is upper limits of normal. The patient is rotated to the right on today's exam, resulting in distortion of the mediastinal contours and reduced diagnostic sensitivity and specificity for mediastinal pathology.  IMPRESSION: 1.  Support apparatus, as above.  Please take position of the low lying position of the left upper extremity PICC. 2.  Low lung volumes.   Original Report Authenticated By: Trudie Reed, M.D.     MEDICATIONS  I have reviewed the patient's current medications.  ASSESSMENT/PLAN:                                                                                                           37 years old male with  lstatus post removal acute left SDH complicated by symptomatic GTC seizures, now resolved. Prolonged EEG showed not evidence of electrographic seizures. Continue current anti-seizure medications for now but will attempt to control with 1 or 2 anticonvulsants if possible. Will follow up. Albert Portela, MD [facility] [phone]  01/01/2013, 10:41 AM

## 2013-01-01 NOTE — Progress Notes (Signed)
Patient appears to be mouthing words.  Appeared to appropriately mouth words such as "What?", "Ouch" and various cuss words when bothered.  This is a change in consciousness while under my care.  Still does not appear to have any visual contact, does not track, and has no reaction to visual threat. Albert [name]

## 2013-01-01 NOTE — OR Nursing (Signed)
01-01-2013 Addendum being made due to left out of the chart the placement of the JP drain. JP drain 10mm flat was inserted into left cranium per Dr.Stern on 12-28-2012.DDay RN

## 2013-01-02 ENCOUNTER — Encounter (HOSPITAL_COMMUNITY): Payer: Self-pay | Admitting: General Surgery

## 2013-01-02 LAB — GLUCOSE, CAPILLARY
Glucose-Capillary: 103 mg/dL — ABNORMAL HIGH (ref 70–99)
Glucose-Capillary: 109 mg/dL — ABNORMAL HIGH (ref 70–99)
Glucose-Capillary: 129 mg/dL — ABNORMAL HIGH (ref 70–99)
Glucose-Capillary: 142 mg/dL — ABNORMAL HIGH (ref 70–99)
Glucose-Capillary: 145 mg/dL — ABNORMAL HIGH (ref 70–99)
Glucose-Capillary: 147 mg/dL — ABNORMAL HIGH (ref 70–99)
Glucose-Capillary: 151 mg/dL — ABNORMAL HIGH (ref 70–99)

## 2013-01-02 LAB — CBC
HCT: 29.2 % — ABNORMAL LOW (ref 39.0–52.0)
Hemoglobin: 10.2 g/dL — ABNORMAL LOW (ref 13.0–17.0)
MCH: 28.7 pg (ref 26.0–34.0)
MCHC: 34.9 g/dL (ref 30.0–36.0)
MCV: 82 fL (ref 78.0–100.0)
Platelets: 425 10*3/uL — ABNORMAL HIGH (ref 150–400)
RBC: 3.56 MIL/uL — ABNORMAL LOW (ref 4.22–5.81)
RDW: 12.4 % (ref 11.5–15.5)
WBC: 13.8 10*3/uL — ABNORMAL HIGH (ref 4.0–10.5)

## 2013-01-02 LAB — BASIC METABOLIC PANEL
BUN: 10 mg/dL (ref 6–23)
CO2: 29 mEq/L (ref 19–32)
Calcium: 8.9 mg/dL (ref 8.4–10.5)
Chloride: 103 mEq/L (ref 96–112)
Creatinine, Ser: 0.53 mg/dL (ref 0.50–1.35)
GFR calc Af Amer: >90 mL/min (ref >90)
GFR calc non Af Amer: >90 mL/min (ref >90)
Glucose, Bld: 136 mg/dL — ABNORMAL HIGH (ref 70–99)
Potassium: 3.6 mEq/L (ref 3.5–5.1)
Sodium: 140 mEq/L (ref 135–145)

## 2013-01-02 MED ORDER — BROMOCRIPTINE MESYLATE 2.5 MG PO TABS
2.5000 mg | ORAL_TABLET | Freq: Every day | ORAL | Status: DC
  Administered 2013-01-02 – 2013-01-03 (×2): 2.5 mg
  Filled 2013-01-02 (×2): qty 1

## 2013-01-02 MED ORDER — PROPRANOLOL HCL 20 MG/5ML PO SOLN
80.0000 mg | Freq: Three times a day (TID) | ORAL | Status: DC
  Administered 2013-01-02 – 2013-01-03 (×3): 80 mg
  Filled 2013-01-02 (×7): qty 20

## 2013-01-02 NOTE — Evaluation (Signed)
Passy-Muir Speaking Valve - Evaluation Patient Details  Name: Albert [name] MRN: [id number] Date of Birth: 09-26-1975  Today's Date: 01/02/2013 Time: 1025-1055 SLP Time Calculation (min): 30 min  Past Medical History:  Past Medical History  Diagnosis Date  . Headache    Past Surgical History:  Past Surgical History  Procedure Laterality Date  . Craniotomy Left 12/15/2012    Procedure: CRANIECTOMY HEMATOMA EVACUATION SUBDURAL WITH PLACEMENT OF BONE FLAP IN ABDOMINAL WALL;  Surgeon: Karn Cassis, MD;  Location: [facility];  Service: Neurosurgery;  Laterality: Left;  . Craniotomy Left 12/28/2012    Procedure: CRANIOTOMY HEMATOMA EVACUATION SUBDURAL;  Surgeon: Maeola Harman, MD;  Location: [facility];  Service: Neurosurgery;  Laterality: Left;  . Peg placement N/A 12/31/2012    Procedure: PERCUTANEOUS ENDOSCOPIC GASTROSTOMY (PEG) PLACEMENT;  Surgeon: Liz Malady, MD;  Location: [facility] ENDOSCOPY;  Service: General;  Laterality: N/A;  bedside  peg   HPI:  37 y/o male, was out drinking with friends all day 4/5. Wife picked him up, getting off the car he fell and according to a friend he was unresponsive, patient had bleeding from his nose and was making a gurgling sound. They called 911. Upon fire rescue's arrival, patient was noted to lose pulse during their evaluation. CPR was initiated. Intubated by EMS, admitted around midnight. Pt with significant significant left subarchnoid hemorrhage, fell sustaining left subdural hematoma and facial fx. Pt then had the cardiac arrest. Suspected to have had a ruptured aneurysm, however no aneurysm noted on cerebral arteriogram, injuries fully contributed to traumatic fall. 4/6 pt underwent Left frontotemporal parietal craniotomy, evacuation of acute subdural hematoma. Insertion of the bone flap in the abdominal wall in the left upper quadrant.Demonstrated seizure activity 4/19 with L gaze deviation and L Extensor Posture 2/2 New L Intraparenchymal  Hematoma. Pt now s/p 2nd Crani with drain in place and Bone flap moved from L UQ to R UQ. S/p PEG and tracheostomy placement 12/31/12. Currently functioning with behaviors consistent with a Rancho Level IV (confused, agitated).    Assessment / Plan / Recommendation Clinical Impression  PMSV evaluation complete. Overall, [name] with good tolerance of cuff deflation and PMSV placement without evidence of distress, change in vital signs, or CO2 retention with valve removal. Able to redirect air through upper airway with low intensity verbal output of questionable origin including current cognitive status vs decreased breath support vs trach size. Favor cognitive status combined with prolonged and multible intubations as well as excessive coughing at baseline which may be impacting vocal cord function. SLP provided max cues for increased intensity with little success due to current cognitive functioning including difficulty sustaining attention to consistently follow commands. Valve removed following treatment. Cuff left deflated following discussion with RT. Paged MD to request orders to leave cuff deflated given overall good tolerance and to facilitate use of upper airway for verbal output, swallowing function, and cognitive recovery.     SLP Assessment  Patient needs continued Speech Lanaguage Pathology Services    Follow Up Recommendations  Inpatient Rehab    Frequency and Duration min 3x week  2 weeks   Pertinent Vitals/Pain None reported    SLP Goals Potential to Achieve Goals: Good Potential Considerations: Severity of impairments Progress/Goals/Alternative treatment plan discussed with pt/caregiver and they: Agree SLP Goal #1: Pt will follow one step commands with max verbal/tactile cues x3.  SLP Goal #1 - Progress: Progressing toward goal SLP Goal #2: Pt will sustain attention to basic functional task  for 30 seconds with max contextual cues.  SLP Goal #2 - Progress: Progressing toward  goal SLP Goal #3: Pt will verbalize at word level (following extubation ) with max verbal/contextual cues.  SLP Goal #3 - Progress: Progressing toward goal SLP Goal #4: Patient will utilize PMSV for 30 minutes or greater without change in vital signs or evidence of distress with moderate assist.  SLP Goal #4 - Progress:  (new goal)   PMSV Trial  PMSV was placed for: 30 minutes Able to redirect subglottic air through upper airway: Yes Able to Attain Phonation: Yes Voice Quality: Hoarse;Low vocal intensity Able to Expectorate Secretions: Yes Level of Secretion Expectoration with PMSV: Oral Breath Support for Phonation: Moderately decreased Intelligibility: Intelligibility reduced Word: 25-49% accurate Phrase: 25-49% accurate Sentence: 0-24% accurate Conversation: 0-24% accurate Respirations During Trial:  (WFL) SpO2 During Trial:  (WFL (98-100%)) Pulse During Trial:  (WFL) Behavior:  (Rancho Level IV  (confused, agitated))   Tracheostomy Tube  Additional Tracheostomy Tube Assessment Fenestrated: No Trach Collar Period: 24 hours per day Secretion Description: minimal Frequency of Tracheal Suctioning: none Level of Secretion Expectoration: Oral    Vent Dependency  Vent Dependent: No FiO2 (%): 40 %    Cuff Deflation Trial Tolerated Cuff Deflation: Yes Length of Time for Cuff Deflation Trial: 60 minues Behavior: Alert (Rancho Level IV (confused, agitated)) Cuff Deflation Trial - Comments: no evidence of distress, change in vital signs   [employer] MA, CCC-SLP [phone]  01/02/2013, 3:27 PM

## 2013-01-02 NOTE — Clinical Social Work Note (Signed)
Clinical Social Worker following for family support. CM pursuing possible placement at [facility] inpatient rehab vs. [facility].  Patient has progressed to Rancho Level IV and remains agitated and restless.  Patient now has trach/PEG in place.  [facility] continuing to follow and remains available for family support as needed.   Macario Golds, [place]  [phone]

## 2013-01-02 NOTE — Progress Notes (Signed)
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 Question anoxic brain injury. Pt with new onset of seizures and follow up scans reveal hematoma and pt with second cranitomy evacuation . Pt now with pending peg and trach placement  Pt presents as Rancho Coma Recovery IV . Wife present throughout session    Follow Up Recommendations  CIR    Barriers to Discharge       Equipment Recommendations  Other (comment)    Recommendations for Other Services Rehab consult  Frequency Min 3X/week   Plan Discharge plan remains appropriate    Precautions / Restrictions Precautions Precautions: Fall Precaution Comments: pt with L Crani with drain and bone flap in RT UQ.  JP drain, trach peg Restrictions Weight Bearing Restrictions: No   Pertinent Vitals/Pain Stable vitals Diaphoretic     ADL  Eating/Feeding: NPO Grooming: Wash/dry face;Moderate assistance (hand over hand with wash cloth) Where Assessed - Grooming: Supported sitting ADL Comments: Pt positioned on the right side of the bed with eye positioned in midline. Pt restless. pt noted to have leg restraint on LT LE and LT UE. Pt positioned at EOB and needed constant tactile input to prevent Lt UE from pulling at lines/ leads. Pt with wrist restraint left on so that therapist could allow movement of LT UE and restrict any unsafe movement quickly. Pt mouthing throoughout session. PMV placed my SLP with cuff deflated. Pt verbalized unintelligible much of the session. Pt nodding head yes and no during session. Pt following simple commands less than 25% of session. Pt wiping face with command but  question automatic response v/s following the command. Pt remains with visual deficits. pt eyes midline and deviated left. Pt appears to localize to sound by turning head slightly to wife's voice during session. Pt turning head left with OT speaking from left side behind supporting patient. Pt not blinking to threat at this time. Pt facial grimace several times during session. RN removed lead sticker and patient mouthed "what is wrong with you?" but not visually attending to RN. Pt with clear fluids exiting JP site throughout session. RN made aware of risk for sink break down at restraints due to restless state. WIfe asking detailed appropriate questions regarding Rancho level IV vs V and potential d/c options. patients mother arriving and asking questions regarding room setup and visitors. A sign was placed on the door to limit visitors and increase the amount of quiet uninterrupted time for the patient. Pt currently in Rancho Coma recovery level IV.      OT Diagnosis:    OT Problem List:   OT Treatment Interventions:     OT Goals Acute Rehab OT Goals OT Goal Formulation: With family Time For Goal Achievement: 01/15/13 Potential to Achieve Goals: Good ADL Goals Pt Will Perform Grooming: with max assist;Supported;Sitting, edge of bed;with cueing (comment type and amount) ADL Goal: Grooming - Progress: Progressing toward goals Miscellaneous OT Goals Miscellaneous OT Goal #1: Pt will follow 1 step simple command 2 out 5 trialls OT Goal: Miscellaneous Goal #1 - Progress: Progressing toward goals Miscellaneous OT Goal #3: Pt will tolerate EOB sitting ~5 minutes with max (A) OT Goal: Miscellaneous Goal #  3 - Progress: Progressing toward goals Miscellaneous OT Goal #4: Pt will complete SIT<>STand (basic transfer) total +2 50%  Visit Information  Last OT Received On: 01/02/13 Assistance Needed: +3 or more (for safety) PT/OT Co-Evaluation/Treatment: Yes    Subjective Data      Prior Functioning        Cognition  Cognition Arousal/Alertness: Awake/alert Behavior During Therapy: Agitated Overall Cognitive Status: Difficult to assess Area of Impairment: Rancho level Current Attention Level: Focused Following Commands: Follows one step commands inconsistently;Follows one step commands with increased time General Comments: Restless and pulling at all lines and leads. Pt following simple commands for wiping face with wash cloth. Pt pitting throughtout session.  Difficult to assess due to: Tracheostomy Rancho Levels of Cognitive Functioning Rancho Los Amigos Scales of Cognitive Functioning: Confused/agitated    Mobility  Bed Mobility Bed Mobility: Supine to Sit;Sitting - Scoot to [place] of Bed;Sit to Supine;Scooting to [facility] Supine to Sit: 1: +2 Total assist;HOB elevated Supine to Sit: Patient Percentage: 30% Sitting - Scoot to Edge of Bed: 1: +2 Total assist Sitting - Scoot to Edge of Bed: Patient Percentage: 10% Sit to Supine: 1: +2 Total assist;HOB flat Sit to Supine: Patient Percentage: 30% Scooting to HOB: 1: +2 Total assist Scooting to [facility]: Patient Percentage: 10% Details for Bed Mobility Assistance: pt following some cues for mobility, but at times needs A to initiate.      Exercises      Balance Balance Balance Assessed: Yes Static Sitting Balance Static Sitting - Balance Support: Bilateral upper extremity supported;Feet supported Static Sitting - Level of Assistance: 4: Min assist;1: +2 Total assist Static Sitting - Comment/# of Minutes: pt again fluctuating between MinA and +2 A to maintain balance sitting EOB. pt required blocking at knees and strong A at back to prevent pt from scooting hips anteriorly off of bed. ? some movement noted in R LE during sitting, however unclear 2/2 strong movements at trunk. While sitting pt able to move eyes past midline towards R side, but still unclear level of visual deficits. pt not repsonding to visual threat. pt directing attention to  auditory stimuli at times pending level of agitation. pt spitting while sitting 2/2 increased cough and secretions.    End of Session OT - End of Session Activity Tolerance: Patient tolerated treatment well Patient left: in bed;with call bell/phone within reach;with family/visitor present Nurse Communication: Mobility status;Precautions;Need for lift equipment  GO     Lucile Shutters 01/02/2013, 2:45 PM Pager: [phone]

## 2013-01-02 NOTE — Evaluation (Signed)
Clinical/Bedside Swallow Evaluation Patient Details  Name: Albert [name] MRN: [id number] Date of Birth: 1976-03-11  Today's Date: 01/02/2013 Time: 1055-1130 SLP Time Calculation (min): 35 min  Past Medical History:  Past Medical History  Diagnosis Date  . Headache    Past Surgical History:  Past Surgical History  Procedure Laterality Date  . Craniotomy Left 12/15/2012    Procedure: CRANIECTOMY HEMATOMA EVACUATION SUBDURAL WITH PLACEMENT OF BONE FLAP IN ABDOMINAL WALL;  Surgeon: Karn Cassis, MD;  Location: [facility];  Service: Neurosurgery;  Laterality: Left;  . Craniotomy Left 12/28/2012    Procedure: CRANIOTOMY HEMATOMA EVACUATION SUBDURAL;  Surgeon: Maeola Harman, MD;  Location: [facility];  Service: Neurosurgery;  Laterality: Left;  . Peg placement N/A 12/31/2012    Procedure: PERCUTANEOUS ENDOSCOPIC GASTROSTOMY (PEG) PLACEMENT;  Surgeon: Liz Malady, MD;  Location: [facility] ENDOSCOPY;  Service: General;  Laterality: N/A;  bedside  peg   HPI:  37 y/o male, was out drinking with friends all day 4/5. Wife picked him up, getting off the car he fell and according to a friend he was unresponsive, patient had bleeding from his nose and was making a gurgling sound. They called 911. Upon fire rescue's arrival, patient was noted to lose pulse during their evaluation. CPR was initiated. Intubated by EMS, admitted around midnight. Pt with significant significant left subarchnoid hemorrhage, fell sustaining left subdural hematoma and facial fx. Pt then had the cardiac arrest. Suspected to have had a ruptured aneurysm, however no aneurysm noted on cerebral arteriogram, injuries fully contributed to traumatic fall. 4/6 pt underwent Left frontotemporal parietal craniotomy, evacuation of acute subdural hematoma. Insertion of the bone flap in the abdominal wall in the left upper quadrant.Demonstrated seizure activity 4/19 with L gaze deviation and L Extensor Posture 2/2 New L Intraparenchymal  Hematoma. Pt now s/p 2nd Crani with drain in place and Bone flap moved from L UQ to R UQ. S/p PEG and tracheostomy placement 12/31/12. Currently functioning with behaviors consistent with a Rancho Level IV (confused, agitated).    Assessment / Plan / Recommendation Clinical Impression  Limited swallow evaluation complete due to current cognitive functioning (Rancho Level IV-confused, agitated). Note patient with good oral control of secretions today, forming a bolus of saliva and expectorating during evaluation. SLP provided trials of ice chips in which patient expectorated bolus in 50% of trials. Positive spontaneous dry swallowing noted with both saliva and residual ice chips without overt evidence of aspiration.  Cognitive status is primary factor impacting ability to initiate pos at this time. SLP will f/u with therapeutic po trials at bedside to determine readiness for pos or an objective swallow evaluation.     Aspiration Risk  Moderate    Diet Recommendation NPO   Medication Administration: Via alternative means    Other  Recommendations Oral Care Recommendations: Oral care QID   Follow Up Recommendations  Inpatient Rehab    Frequency and Duration min 3x week  2 weeks   Pertinent Vitals/Pain None reported    SLP Swallow Goals Goal #3: Patient will orally accept clinician provided bolus in 75% of trials with max cues.  Swallow Study Goal #3 - Progress:  (new goal)   Swallow Study    General HPI: 37 y/o male, was out drinking with friends all day 4/5. Wife picked him up, getting off the car he fell and according to a friend he was unresponsive, patient had bleeding from his nose and was making a gurgling sound. They called 911.  Upon fire rescue's arrival, patient was noted to lose pulse during their evaluation. CPR was initiated. Intubated by EMS, admitted around midnight. Pt with significant significant left subarchnoid hemorrhage, fell sustaining left subdural hematoma and facial  fx. Pt then had the cardiac arrest. Suspected to have had a ruptured aneurysm, however no aneurysm noted on cerebral arteriogram, injuries fully contributed to traumatic fall. 4/6 pt underwent Left frontotemporal parietal craniotomy, evacuation of acute subdural hematoma. Insertion of the bone flap in the abdominal wall in the left upper quadrant.Demonstrated seizure activity 4/19 with L gaze deviation and L Extensor Posture 2/2 New L Intraparenchymal Hematoma. Pt now s/p 2nd Crani with drain in place and Bone flap moved from L UQ to R UQ. S/p PEG and tracheostomy placement 12/31/12. Currently functioning with behaviors consistent with a Rancho Level IV (confused, agitated).  Type of Study: Bedside swallow evaluation Previous Swallow Assessment: none Diet Prior to this Study: NPO;PEG tube Temperature Spikes Noted: No Respiratory Status: Trach (trach collar) History of Recent Intubation: Yes Length of Intubations (days): 12 days (4/5-4/14, 4/19-4/22) Behavior/Cognition: Alert (Rancho Level IV (confused, agitated)) Oral Cavity - Dentition: Adequate natural dentition Self-Feeding Abilities: Total assist Patient Positioning: Upright in bed Baseline Vocal Quality: Hoarse;Low vocal intensity (with PMSV in place) Volitional Cough: Strong (spontaneous, reflexive cough) Volitional Swallow: Able to elicit (spontaneous swallow)    Oral/Motor/Sensory Function Overall Oral Motor/Sensory Function:  (unable to formally assess due to cognitive status; ? WFL)   Ice Chips Ice chips: Impaired Presentation: Spoon Oral Phase Impairments: Reduced labial seal;Impaired anterior to posterior transit;Poor awareness of bolus (expectoration) Other Comments: expectoration of 50% of boluses provided   Thin Liquid Thin Liquid: Not tested    Nectar Thick Nectar Thick Liquid: Not tested   Honey Thick Honey Thick Liquid: Not tested   Puree Puree: Not tested   Solid   GO   Albert Christine MA, CCC-SLP [phone]  Solid:  Not tested       Albert [name] Albert [name] 01/02/2013,3:13 PM

## 2013-01-02 NOTE — Progress Notes (Signed)
Physical Therapy Treatment Patient Details Name: Albert [name] MRN: [id number] DOB: 05-09-1976 Today's Date: 01/02/2013 Time: 4098-1191 PT Time Calculation (min): 85 min  PT Assessment / Plan / Recommendation Comments on Treatment Session  pt presents with fall (Etoh related) resulting in fronto-temporo-parietal SDH post Crani with bone flap in R UQ.  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  pt also with seizure from L Intraparenchymal Hematoma s/p 2nd Crani with drain in place.  pt continues with increased restlessness and agitation today and demonstrates a Rancho IV level.  SLP present and trialing PMSV with pt able to produce whispered voice, though needs cueing for verbalizations other than occasional curses and reacting to RN removing an electrode from pt's chest, with pt verbalizing "What is wrong with you?".  Lengthy discussion with wife and parents, who are concerned about number of visitors and pt needing more rest time.  Spoke with RN about placing a sign on door.      Follow Up Recommendations  CIR     Does the patient have the potential to tolerate intense rehabilitation     Barriers to Discharge        Equipment Recommendations  None recommended by PT    Recommendations for Other Services Rehab consult  Frequency Min 3X/week   Plan Discharge plan remains appropriate;Frequency remains appropriate    Precautions / Restrictions Precautions Precautions: Fall Precaution Comments: pt with L Crani with drain and bone flap in RT UQ.   Restrictions Weight Bearing Restrictions: No   Pertinent Vitals/Pain Pt and restless and agitated throughout session.  Difficult to assess pain.      Mobility  Bed Mobility Bed Mobility: Supine to Sit;Sitting - Scoot to [place] of Bed;Sit to Supine;Scooting to [facility] Supine to Sit: 1: +2 Total assist;HOB elevated Supine to Sit: Patient Percentage: 30% Sitting - Scoot to Edge of Bed: 1: +2 Total assist Sitting -  Scoot to Edge of Bed: Patient Percentage: 10% Sit to Supine: 1: +2 Total assist;HOB flat Sit to Supine: Patient Percentage: 30% Scooting to HOB: 1: +2 Total assist Scooting to [facility]: Patient Percentage: 10% Details for Bed Mobility Assistance: pt following some cues for mobility, but at times needs A to initiate.   Transfers Transfers: Not assessed Ambulation/Gait Ambulation/Gait Assistance: Not tested (comment) Stairs: No Wheelchair Mobility Wheelchair Mobility: No    Exercises     PT Diagnosis:    PT Problem List:   PT Treatment Interventions:     PT Goals Acute Rehab PT Goals PT Goal Formulation: With family Time For Goal Achievement: 01/16/13 Potential to Achieve Goals: Good Pt will go Supine/Side to Sit: with mod assist PT Goal: Supine/Side to Sit - Progress: Goal set today Pt will Sit at Edge of Bed: with min assist;3-5 min;with unilateral upper extremity support PT Goal: Sit at Edge Of Bed - Progress: Goal set today Pt will go Sit to Supine/Side: with mod assist PT Goal: Sit to Supine/Side - Progress: Goal set today Pt will go Sit to Stand: with mod assist PT Goal: Sit to Stand - Progress: Goal set today Pt will go Stand to Sit: with mod assist PT Goal: Stand to Sit - Progress: Goal set today Pt will Transfer Bed to Chair/Chair to Bed: with mod assist PT Transfer Goal: Bed to Chair/Chair to Bed - Progress: Goal set today Pt will Stand: with mod assist;3 - 5 min;with bilateral upper extremity support PT Goal: Stand - Progress: Goal set today  Pt will Ambulate: 16 - 50 feet;with +2 total assist PT Goal: Ambulate - Progress: Goal set today Additional Goals Additional Goal #1: pt will follow simple one step direction 3/5 trials.   PT Goal: Additional Goal #1 - Progress: Goal set today Additional Goal #2: pt will maintain sustained attention for mobility task 3/5 trials.   PT Goal: Additional Goal #2 - Progress: Goal set today  Visit Information  Last PT Received On:  01/02/13 Assistance Needed: +3 or more (for safety) PT/OT Co-Evaluation/Treatment: Yes    Subjective Data  Subjective: PMSV on with pt whispering occasional words.     Cognition  Cognition Arousal/Alertness: Awake/alert Behavior During Therapy: Agitated Overall Cognitive Status: Difficult to assess Area of Impairment: Rancho level Current Attention Level: Focused Following Commands: Follows one step commands inconsistently General Comments: pt restless and agitated througout session.  pt responding to more simple directions today and answered some simple yes/no questions.  pt with bouts of coughing while sitting requiring occasional suctioning.  pt's gaze able to pass midline to R side today without demonstrating drift noted in previous sessions.  pt attempting to grab at lines and trach collar with L UE only along with continued cursing and occasionally spitting.   Difficult to assess due to: Tracheostomy (Trying PMSV with SLP for first time today.  ) Rancho Levels of Cognitive Functioning Rancho Mirant Scales of Cognitive Functioning: Confused/agitated    Balance  Balance Balance Assessed: Yes Static Sitting Balance Static Sitting - Balance Support: Bilateral upper extremity supported;Feet supported Static Sitting - Level of Assistance: 4: Min assist;1: +2 Total assist Static Sitting - Comment/# of Minutes: pt again fluctuating between MinA and +2 A to maintain balance sitting EOB.  pt required blocking at knees and strong A at back to prevent pt from scooting hips anteriorly off of bed.  ? some movement noted in R LE during sitting, however unclear 2/2 strong movements at trunk.  While sitting pt able to move eyes past midline towards R side, but still unclear level of visual deficits.  pt not repsonding to visual threat.  pt directing attention to auditory stimuli at times pending level of agitation.  pt spitting while sitting 2/2 increased cough and secretions.    End of Session  PT - End of Session Equipment Utilized During Treatment: Oxygen (Trach Collar) Activity Tolerance: Treatment limited secondary to agitation Patient left: in bed;with call bell/phone within reach;with bed alarm set;with restraints reapplied Nurse Communication: Mobility status   GP     Sunny Schlein, [place] [phone] 01/02/2013, 1:20 PM

## 2013-01-02 NOTE — Progress Notes (Signed)
Patient ID: [name], male   DOB: 01/07/1976, 37 y.o.   MRN: [id number]   LOS: 19 days   Subjective: Diaphoretic, following simple commands with significant delay.   Objective: Vital signs in last 24 hours: Temp:  [98.8 F (37.1 C)-100.5 F (38.1 C)] 100.5 F (38.1 C) (04/24 0419) Pulse Rate:  [79-111] 99 (04/24 0800) Resp:  [19-40] 20 (04/24 0800) BP: (106-159)/(67-122) 141/90 mmHg (04/24 0800) SpO2:  [97 %-100 %] 100 % (04/24 0800) FiO2 (%):  [40 %] 40 % (04/24 0739) Weight:  [189 lb 6 oz (85.9 kg)] 189 lb 6 oz (85.9 kg) (04/24 0500) Last BM Date: 01/01/13   Laboratory  CBC  Recent Labs  01/01/13 0515 01/02/13 0510  WBC 10.8* 13.8*  HGB 8.3* 10.2*  HCT 23.9* 29.2*  PLT 322 425*   BMET  Recent Labs  01/01/13 0515 01/02/13 0510  NA 140 140  K 3.4* 3.6  CL 107 103  CO2 25 29  GLUCOSE 91 136*  BUN 10 10  CREATININE 0.52 0.53  CALCIUM 7.7* 8.9     Physical Exam General appearance: Mildly agitated Resp: clear to auscultation bilaterally Cardio: regular rate and rhythm GI: normal findings: bowel sounds normal and soft   Assessment/Plan: Fall  TBI s/p decompressive craniectomy x2 -- per NS. Appears to be storming, will increase propanolol and add bromocriptine Facial abrasions -- Local care  ABL anemia -- stable FEN -- Tolerating TF, TC VTE -- SCD's  Dispo -- To SDU, CIR when bed available    Freeman Caldron, PA-C Pager: [phone] General Trauma PA Pager: [phone]   01/02/2013

## 2013-01-02 NOTE — Progress Notes (Signed)
Rehab admissions - Evaluated for possible admission.  I met with wife yesterday and discussed issues related to rehab and discharge options.  I gave wife rehab booklets and explained about inpatient rehab.  I have called [employer] and I have faxed all information to insurance carrier.  Today I called [employer] and spoke with a case Production designer, theatre/television/film.  I hope to have approval for admission to acute inpatient rehab for tomorrow.  I have talked with Dr. Riley Kill about this patient's medical and functional progress.  I have approval from Dr. Riley Kill for inpatient rehab admission.  I will follow up in am.  Call me for questions.  #[phone]

## 2013-01-02 NOTE — Progress Notes (Signed)
Speech Language Pathology Treatment Patient Details Name: Albert [name] MRN: [id number] DOB: 01-02-1976 Today's Date: 01/02/2013 Time: 4098-1191 SLP Time Calculation (min): 23 min  Assessment / Plan / Recommendation Clinical Impression  Treatment focused on facilitation of cognitive recovery and progression through the Rancho Levels of Coma Recovery. SLP provided opportunities for increased response via auditory, tactile, and visual stimuli. Patient with localized responses to auditory stimuli via eye opening when name called in 50% of opportunities, ability to follow commands in approximately 10% opportunities with moderate contextual, verbal, and tactile cues, and appropriate verbalizations in response to basic biographical questions in approximately 10% of opportunities. Increased purposeful, goal directed behavior noted today including wiping of mouth and nose in response to oral and nasal secretions x2-3. Patient continues to present with  facial grimacing and a significant increase in agitation consistant with a Rancho Level IV (confused, agitated) characterized by increased body movementse, cursing, pulling at lines, etc. Extensive education complete with spouse and mother regarding current level of cognitive functioning including techniques which may facilitate appropriateness of responses and calm behavior.     SLP Plan  Continue with current plan of care (carryover previous goals)    Pertinent Vitals/Pain None reported  SLP Goals  SLP Goals Potential to Achieve Goals: Good SLP Goal #1: Pt will follow one step commands with max verbal/tactile cues x3.  SLP Goal #1 - Progress: Progressing toward goal SLP Goal #2: Pt will sustain attention to basic functional task for 30 seconds with max contextual cues.  SLP Goal #2 - Progress: Progressing toward goal SLP Goal #3: Pt will verbalize at word level (following extubation ) with max verbal/contextual cues.  SLP Goal #3 - Progress:  Progressing toward goal  General Temperature Spikes Noted: No Respiratory Status: Trach (trach collar) Behavior/Cognition: Alert Oral Cavity - Dentition: Adequate natural dentition Patient Positioning: Upright in bed        GO   Albert Lango MA, CCC-SLP [phone]   Albert [name] Albert [name] 01/02/2013, 2:59 PM

## 2013-01-02 NOTE — Progress Notes (Signed)
Doing well. Encouraging gradual neurologic improvement. Trach and PEG sites OK. Patient examined and I agree with the assessment and plan  Violeta Gelinas, MD, MPH, FACS Pager: [phone]  01/02/2013 10:04 AM

## 2013-01-02 NOTE — Progress Notes (Signed)
Patient ID: Albert [name], male   DOB: 01/17/1976, 37 y.o.   MRN: [id number] Saw hiom and spoke with wife this am befire being transferred to 3300.open eyes and f/c. Rehabilitation to re-evaluate

## 2013-01-03 ENCOUNTER — Inpatient Hospital Stay (HOSPITAL_COMMUNITY): Payer: BC Managed Care – PPO

## 2013-01-03 ENCOUNTER — Encounter (HOSPITAL_COMMUNITY): Payer: Self-pay | Admitting: General Surgery

## 2013-01-03 ENCOUNTER — Inpatient Hospital Stay (HOSPITAL_COMMUNITY)
Admission: RE | Admit: 2013-01-03 | Discharge: 2013-02-07 | DRG: 462 | Disposition: A | Discharge status: Home / Self Care | Payer: BC Managed Care – PPO | Source: Intra-hospital | Attending: Physical Medicine & Rehabilitation | Admitting: Physical Medicine & Rehabilitation

## 2013-01-03 DIAGNOSIS — S069X0D Unspecified intracranial injury without loss of consciousness, subsequent encounter: Secondary | ICD-10-CM

## 2013-01-03 DIAGNOSIS — F1010 Alcohol abuse, uncomplicated: Secondary | ICD-10-CM

## 2013-01-03 DIAGNOSIS — J95821 Acute postprocedural respiratory failure: Secondary | ICD-10-CM

## 2013-01-03 DIAGNOSIS — D72829 Elevated white blood cell count, unspecified: Secondary | ICD-10-CM

## 2013-01-03 DIAGNOSIS — S065X9D Traumatic subdural hemorrhage with loss of consciousness of unspecified duration, subsequent encounter: Secondary | ICD-10-CM

## 2013-01-03 DIAGNOSIS — S069X9A Unspecified intracranial injury with loss of consciousness of unspecified duration, initial encounter: Secondary | ICD-10-CM

## 2013-01-03 DIAGNOSIS — S069XAA Unspecified intracranial injury with loss of consciousness status unknown, initial encounter: Secondary | ICD-10-CM

## 2013-01-03 DIAGNOSIS — Z87891 Personal history of nicotine dependence: Secondary | ICD-10-CM

## 2013-01-03 DIAGNOSIS — I469 Cardiac arrest, cause unspecified: Secondary | ICD-10-CM

## 2013-01-03 DIAGNOSIS — S065XAA Traumatic subdural hemorrhage with loss of consciousness status unknown, initial encounter: Secondary | ICD-10-CM

## 2013-01-03 DIAGNOSIS — R350 Frequency of micturition: Secondary | ICD-10-CM

## 2013-01-03 DIAGNOSIS — S098XXS Other specified injuries of head, sequela: Secondary | ICD-10-CM

## 2013-01-03 DIAGNOSIS — J189 Pneumonia, unspecified organism: Secondary | ICD-10-CM | POA: Diagnosis not present

## 2013-01-03 DIAGNOSIS — Z9889 Other specified postprocedural states: Secondary | ICD-10-CM

## 2013-01-03 DIAGNOSIS — G40909 Epilepsy, unspecified, not intractable, without status epilepticus: Secondary | ICD-10-CM

## 2013-01-03 DIAGNOSIS — Z930 Tracheostomy status: Secondary | ICD-10-CM

## 2013-01-03 DIAGNOSIS — I6350 Cerebral infarction due to unspecified occlusion or stenosis of unspecified cerebral artery: Secondary | ICD-10-CM

## 2013-01-03 DIAGNOSIS — Z5189 Encounter for other specified aftercare: Principal | ICD-10-CM

## 2013-01-03 DIAGNOSIS — S0292XA Unspecified fracture of facial bones, initial encounter for closed fracture: Secondary | ICD-10-CM

## 2013-01-03 DIAGNOSIS — J95822 Acute and chronic postprocedural respiratory failure: Secondary | ICD-10-CM

## 2013-01-03 DIAGNOSIS — Z79899 Other long term (current) drug therapy: Secondary | ICD-10-CM

## 2013-01-03 DIAGNOSIS — S069X0S Unspecified intracranial injury without loss of consciousness, sequela: Secondary | ICD-10-CM

## 2013-01-03 DIAGNOSIS — S02401A Maxillary fracture, unspecified, initial encounter for closed fracture: Secondary | ICD-10-CM

## 2013-01-03 DIAGNOSIS — S02109A Fracture of base of skull, unspecified side, initial encounter for closed fracture: Secondary | ICD-10-CM

## 2013-01-03 DIAGNOSIS — Z931 Gastrostomy status: Secondary | ICD-10-CM

## 2013-01-03 DIAGNOSIS — S02400A Malar fracture unspecified, initial encounter for closed fracture: Secondary | ICD-10-CM

## 2013-01-03 DIAGNOSIS — R1310 Dysphagia, unspecified: Secondary | ICD-10-CM

## 2013-01-03 DIAGNOSIS — S098XXD Other specified injuries of head, subsequent encounter: Secondary | ICD-10-CM

## 2013-01-03 DIAGNOSIS — S06309A Unspecified focal traumatic brain injury with loss of consciousness of unspecified duration, initial encounter: Secondary | ICD-10-CM

## 2013-01-03 DIAGNOSIS — R259 Unspecified abnormal involuntary movements: Secondary | ICD-10-CM

## 2013-01-03 LAB — CULTURE, BLOOD (ROUTINE X 2)
Culture: NO GROWTH
Culture: NO GROWTH

## 2013-01-03 LAB — URINALYSIS, ROUTINE W REFLEX MICROSCOPIC
Bilirubin Urine: NEGATIVE
Glucose, UA: NEGATIVE mg/dL
Hgb urine dipstick: NEGATIVE
Ketones, ur: NEGATIVE mg/dL
Leukocytes, UA: NEGATIVE
Nitrite: NEGATIVE
Protein, ur: 30 mg/dL — AB
Specific Gravity, Urine: 1.029 (ref 1.005–1.030)
Urobilinogen, UA: 2 mg/dL — ABNORMAL HIGH (ref 0.0–1.0)
pH: 7.5 (ref 5.0–8.0)

## 2013-01-03 LAB — URINE MICROSCOPIC-ADD ON

## 2013-01-03 LAB — GLUCOSE, CAPILLARY
Glucose-Capillary: 147 mg/dL — ABNORMAL HIGH (ref 70–99)
Glucose-Capillary: 159 mg/dL — ABNORMAL HIGH (ref 70–99)
Glucose-Capillary: 128 mg/dL — ABNORMAL HIGH (ref 70–99)
Glucose-Capillary: 128 mg/dL — ABNORMAL HIGH (ref 70–99)
Glucose-Capillary: 119 mg/dL — ABNORMAL HIGH (ref 70–99)

## 2013-01-03 LAB — PHENYTOIN LEVEL, TOTAL: Phenytoin Lvl: 4 ug/mL — ABNORMAL LOW (ref 10.0–20.0)

## 2013-01-03 LAB — ALBUMIN: Albumin: 2.8 g/dL — ABNORMAL LOW (ref 3.5–5.2)

## 2013-01-03 MED ORDER — LORAZEPAM 2 MG/ML IJ SOLN
0.5000 mg | INTRAMUSCULAR | Status: DC | PRN
  Administered 2013-01-04 – 2013-01-09 (×6): 1 mg via INTRAVENOUS
  Filled 2013-01-03 (×7): qty 1

## 2013-01-03 MED ORDER — PRO-STAT SUGAR FREE PO LIQD
30.0000 mL | Freq: Three times a day (TID) | ORAL | Status: DC
  Filled 2013-01-03 (×2): qty 30

## 2013-01-03 MED ORDER — INSULIN ASPART 100 UNIT/ML ~~LOC~~ SOLN
0.0000 [IU] | SUBCUTANEOUS | Status: DC
  Administered 2013-01-03: 2 [IU] via SUBCUTANEOUS
  Administered 2013-01-04: 3 [IU] via SUBCUTANEOUS
  Administered 2013-01-04 (×3): 2 [IU] via SUBCUTANEOUS
  Administered 2013-01-04: 3 [IU] via SUBCUTANEOUS
  Administered 2013-01-04 – 2013-01-06 (×7): 2 [IU] via SUBCUTANEOUS
  Administered 2013-01-06: 3 [IU] via SUBCUTANEOUS
  Administered 2013-01-06 – 2013-01-10 (×14): 2 [IU] via SUBCUTANEOUS
  Administered 2013-01-11: 3 [IU] via SUBCUTANEOUS
  Administered 2013-01-12 (×2): 2 [IU] via SUBCUTANEOUS
  Administered 2013-01-13: 3 [IU] via SUBCUTANEOUS
  Administered 2013-01-14: 2 [IU] via SUBCUTANEOUS

## 2013-01-03 MED ORDER — BIOTENE DRY MOUTH MT LIQD
15.0000 mL | Freq: Four times a day (QID) | OROMUCOSAL | Status: DC
Start: 2013-01-03 — End: 2013-01-23
  Administered 2013-01-03 – 2013-01-22 (×48): 15 mL via OROMUCOSAL

## 2013-01-03 MED ORDER — PIVOT 1.5 CAL PO LIQD
1000.0000 mL | ORAL | Status: DC
  Filled 2013-01-03 (×2): qty 1000

## 2013-01-03 MED ORDER — ACETAMINOPHEN 160 MG/5ML PO SOLN
650.0000 mg | ORAL | Status: DC | PRN
  Administered 2013-01-03 – 2013-01-05 (×8): 650 mg
  Filled 2013-01-03 (×5): qty 20.3

## 2013-01-03 MED ORDER — PIVOT 1.5 CAL PO LIQD
1000.0000 mL | ORAL | Status: DC
  Filled 2013-01-03 (×3): qty 1000

## 2013-01-03 MED ORDER — PANTOPRAZOLE SODIUM 40 MG PO PACK
40.0000 mg | PACK | Freq: Every day | ORAL | Status: DC
  Administered 2013-01-04 – 2013-01-27 (×24): 40 mg
  Filled 2013-01-03 (×28): qty 20

## 2013-01-03 MED ORDER — ONDANSETRON HCL 4 MG/2ML IJ SOLN: 4.0000 mg | Freq: Four times a day (QID) | INTRAMUSCULAR | Status: DC | PRN

## 2013-01-03 MED ORDER — SODIUM CHLORIDE 0.9 % IV SOLN
1500.0000 mg | Freq: Two times a day (BID) | INTRAVENOUS | Status: DC
  Administered 2013-01-03 – 2013-01-07 (×8): 1500 mg via INTRAVENOUS
  Filled 2013-01-03 (×10): qty 15

## 2013-01-03 MED ORDER — BROMOCRIPTINE MESYLATE 2.5 MG PO TABS
2.5000 mg | ORAL_TABLET | Freq: Every day | ORAL | Status: DC
  Filled 2013-01-03: qty 1

## 2013-01-03 MED ORDER — PROPRANOLOL HCL 20 MG/5ML PO SOLN
80.0000 mg | Freq: Three times a day (TID) | ORAL | Status: DC
  Administered 2013-01-03: 80 mg
  Filled 2013-01-03 (×6): qty 20

## 2013-01-03 MED ORDER — CHLORHEXIDINE GLUCONATE 0.12 % MT SOLN
15.0000 mL | Freq: Two times a day (BID) | OROMUCOSAL | Status: DC
  Administered 2013-01-03 – 2013-01-22 (×36): 15 mL via OROMUCOSAL
  Filled 2013-01-03 (×42): qty 15

## 2013-01-03 MED ORDER — LACOSAMIDE 200 MG PO TABS: 100.0000 mg | ORAL_TABLET | Freq: Two times a day (BID) | ORAL | Status: DC

## 2013-01-03 MED ORDER — FREE WATER: 200.0000 mL | Freq: Four times a day (QID) | Status: DC

## 2013-01-03 MED ORDER — PHENYTOIN 50 MG PO CHEW
100.0000 mg | CHEWABLE_TABLET | Freq: Three times a day (TID) | ORAL | Status: DC
  Administered 2013-01-03 – 2013-01-27 (×72): 100 mg
  Filled 2013-01-03 (×78): qty 2

## 2013-01-03 MED ORDER — LACOSAMIDE 50 MG PO TABS: 100.0000 mg | ORAL_TABLET | Freq: Two times a day (BID) | ORAL | Status: DC

## 2013-01-03 MED ORDER — ONDANSETRON HCL 4 MG PO TABS: 4.0000 mg | ORAL_TABLET | Freq: Four times a day (QID) | ORAL | Status: DC | PRN

## 2013-01-03 MED ORDER — LACOSAMIDE 50 MG PO TABS
100.0000 mg | ORAL_TABLET | Freq: Two times a day (BID) | ORAL | Status: DC
  Administered 2013-01-03 – 2013-01-09 (×13): 100 mg
  Filled 2013-01-03 (×6): qty 2
  Filled 2013-01-03: qty 1
  Filled 2013-01-03 (×2): qty 2
  Filled 2013-01-03: qty 1
  Filled 2013-01-03 (×5): qty 2

## 2013-01-03 MED ORDER — METHYLPHENIDATE HCL 5 MG PO TABS
5.0000 mg | ORAL_TABLET | Freq: Two times a day (BID) | ORAL | Status: DC
  Administered 2013-01-04 – 2013-01-05 (×4): 5 mg
  Filled 2013-01-03 (×4): qty 1

## 2013-01-03 MED ORDER — IPRATROPIUM-ALBUTEROL 20-100 MCG/ACT IN AERS
2.0000 | INHALATION_SPRAY | Freq: Four times a day (QID) | RESPIRATORY_TRACT | Status: DC | PRN
  Filled 2013-01-03: qty 4

## 2013-01-03 MED ORDER — ADULT MULTIVITAMIN LIQUID CH
5.0000 mL | Freq: Every day | ORAL | Status: DC
  Administered 2013-01-04 – 2013-01-28 (×25): 5 mL via ORAL
  Filled 2013-01-03 (×27): qty 5

## 2013-01-03 MED ORDER — FREE WATER
350.0000 mL | Freq: Four times a day (QID) | Status: DC
  Administered 2013-01-03 – 2013-01-09 (×24): 350 mL

## 2013-01-03 NOTE — Progress Notes (Signed)
I D/W Dr. Riley Kill with CIR Patient examined and I agree with the assessment and plan  Violeta Gelinas, MD, MPH, FACS Pager: [phone]  01/03/2013 1:26 PM

## 2013-01-03 NOTE — Progress Notes (Signed)
Patient is continuously draining serous fluid from scalp incision, has saturated 3 bed pads placed under his head in 3.5 hours. Marissa Nestle, PA notified, assess patient, no new orders received. Continuing to monitor. Hedy Camara

## 2013-01-03 NOTE — Plan of Care (Addendum)
**Note Albert-Identified via Obfuscation** Overall Plan of Care [facility]) Patient Details Name: DEMONE [name] MRN: [id number] DOB: October 09, 1975  Diagnosis:  Left SDH and SAH  Co-morbidities: sz's, etoh abuse, wound care  Functional Problem List  Patient demonstrates impairments in the following areas: Bladder, Bowel, Cognition, Medication Management, Nutrition, Pain, Safety, Sensory  and Skin Integrity  Basic ADL's: eating, grooming, bathing, dressing and toileting Advanced ADL's: simple meal preparation  Transfers:  bed mobility, bed to chair, toilet, tub/shower, car and furniture Locomotion:  ambulation, wheelchair mobility and stairs  Additional Impairments:  Swallowing, Communication  comprehension and expression, Social Cognition   social interaction, problem solving, memory, attention and awareness and Leisure Awareness  Anticipated Outcomes Item Anticipated Outcome  Eating/Swallowing  Mod assist  Basic self-care  Min Assist  Tolieting  Min Assist  Bowel/Bladder  Max assist   Transfers  Mod-Independent  Locomotion  Using LRAD ambulate x 50' S/Mod-I Up/down 4 steps with S/Mod-I, W/C x 200' S/Independent  Communication  Mod assist  Cognition  Max assist  Pain  Pain managed with prn medications 3 or less, no nonverbal indicators of pain   Safety/Judgment  Max assist  Other  Skin: no new breakdown, incision to head to heal, no breakdown/infection to trach site   Therapy Plan: PT Intensity: Minimum of 1-2 x/day ,45 to 90 minutes PT Frequency: 5 out of 7 days PT Duration Estimated Length of Stay: 5-6 weeks OT Duration/Estimated Length of Stay: 5-6 weeks ST Intensity:  One time per day. ST Frequency:  5 out of 7 days. ST duration/estimated length of stay:  5-6 weeks.  SLP Frequency: 5 out of 7 days    Team Interventions: Item RN PT OT SLP SW TR Other  Self Care/Advanced ADL Retraining   x      Neuromuscular Re-Education  x x      Therapeutic Activities  x x x  x   UE/LE Strength Training/ROM  x x   x    UE/LE Coordination Activities  x x   x   Visual/Perceptual Remediation/Compensation  x x   x   DME/Adaptive Equipment Instruction  x x   x   Therapeutic Exercise  x x   x   Balance/Vestibular Training  x x   x   Patient/Family Education x x x x  x   Cognitive Remediation/Compensation  x x x  x   Functional Mobility Training  x x   x   Ambulation/Gait Training  x       Stair Training  x       Wheelchair Propulsion/Positioning  x x   x   Functional Tourist information centre manager Reintegration   x   x   Dysphagia/Aspiration Precaution Training    x     Speech/Language Facilitation    x     Bladder Management x        Bowel Management x        Disease Management/Prevention x  x      Pain Management x  x      Medication Management x        Skin Care/Wound Management x        Splinting/Orthotics  x       Discharge Planning   x x  x   Psychosocial Support   x x  x  Team Discharge Planning: Destination: PT-Home ,OT- Home , SLP-  Projected Follow-up: PT-Home health PT, OT-  Home health OT, SLP-24 hour supervision/assistance Projected Equipment Needs: PT- , OT- 3 in 1 bedside comode;Tub/shower bench;Wheelchair cushion (measurements);Wheelchair (measurements), SLP-None recommended by SLP Patient/family involved in discharge planning: PT- Patient;Family member/caregiver,  OT-Patient, SLP-Family member/caregiver  MD ELOS: 5-6 weeks Medical Rehab Prognosis:  Good Assessment: The patient has been admitted for CIR therapies. The team will be addressing, functional mobility, strength, stamina, balance, safety, adaptive techniques/equipment, self-care, bowel and bladder mgt, patient and caregiver education, cognition, communication, swallowing, behavior, . Goals have been set at min to mod assist with basic mobility and self-care to mod-max assist with cognition. Extensive family education has already begun.    Ranelle Oyster, MD, FAAPMR      See  Team Conference Notes for weekly updates to the plan of care

## 2013-01-03 NOTE — Progress Notes (Signed)
Patient was admitted to 4002 at 1500. Report received from [place], [place]. Wife and mother at bedside. Patient is alert but unable to assess orientation status. Soft ankle restraint to LLE and soft wrist restraint to LUE. PEG tube infusing tube feed. #8 disposable cuffless trach in place on 5L 28% trach collar. VSS except temp 99.4, received Tylenol prior to discharge from 3300. Patient restless, unable to track or follow commands. Ability to follow commands and verbalize fluctuates per mother and wife. Incision to head intact with staples draining serous fluid, JP drain removed prior to admission to rehab.  Brain injury education packet given to wife, discussed safety plan, call bell system, and rehab schedule. Wife verbalized understanding. Patient calm, resting at this time with wife at bedside. Hedy Camara
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
## 2013-01-03 NOTE — Progress Notes (Signed)
NUTRITION FOLLOW UP  Intervention:   1. Increase Pivot 1.5 by 10 ml every 4 hours to goal rate of 75 ml/hr  2.D/C Prostat  3. Increase free water to 350 ml every 6 hours to provide 1 ml/kcal  TF regimen will provide: 2700 kcal (100% of needs) and 168 grams protein ( >100% of minimum needs), and 1366 ml H2O. Total free water (tube feeding and free water flushes): 2166 ml.  Nutrition Dx:   Inadequate oral intake related to inability to eat as evidenced by NPO status; ongoing.  Goal:   Pt to meet >/= 90% of their estimated nutrition needs; met  Monitor:   TF tolerance, weight, labs  Assessment:   Pt admitted after fall, positive for ETOH. Has SDH and SAH, multiple facial fractures, s/p decompressive craniectomy of L SDH. Pt with questionable cardiac arrest with CPR. Pt extubated 4/14. Unable to pass swallow eval at that time.  Pt continues with persistent fevers of 98-101.   Pt transferred to 3100 for seizures, pt required second craniectomy for hematoma evacuation 4/19, pt had bone flap moved to allow for PEG placement. Pt extubated and is now on trach collar only.   Free water 200 ml every 6 hours providing 800 ml H2O, pt also remains on IVF @ 75 ml/hr with additional potassium.     Height: Ht Readings from Last 1 Encounters:  01/01/13 5\' 11"  (1.803 m)    Weight Status:   Wt Readings from Last 1 Encounters:  01/03/13 183 lb 10.3 oz (83.3 kg)  Admission weight 216 lb Usual weight: 203-204 lb  Re-estimated needs:  Kcal: 2700-2800  Protein: 139-181 grams Fluid: > 2.7 L/day  Skin: incisions  Diet Order:   NPO   Intake/Output Summary (Last 24 hours) at 01/03/13 0955 Last data filed at 01/03/13 0900  Gross per 24 hour  Intake   4270 ml  Output   2155 ml  Net   2115 ml    Last BM: 4/24   Labs:   Recent Labs Lab 12/30/12 1103 01/01/13 0515 01/02/13 0510  NA 146* 140 140  K 3.9 3.4* 3.6  CL 113* 107 103  CO2 27 25 29   BUN 28* 10 10  CREATININE 0.70  0.52 0.53  CALCIUM 8.6 7.7* 8.9  GLUCOSE 135* 91 136*    CBG (last 3)   Recent Labs  01/02/13 2347 01/03/13 0414 01/03/13 0743  GLUCAP 147* 147* 159*    Scheduled Meds: . antiseptic oral rinse  15 mL Mouth Rinse QID  . bromocriptine  2.5 mg Per Tube Daily  . chlorhexidine  15 mL Mouth Rinse BID  . feeding supplement  30 mL Per Tube TID  . free water  200 mL Per Tube Q6H  . insulin aspart  0-15 Units Subcutaneous Q4H  . lacosamide (VIMPAT) IV  150 mg Intravenous Q12H  . levETIRAcetam  1,500 mg Intravenous Q12H  . LORazepam  1 mg Intravenous Once  . multivitamin  5 mL Oral Daily  . pantoprazole sodium  40 mg Per Tube Daily  . phenytoin (DILANTIN) IV  100 mg Intravenous Q8H  . propranolol  80 mg Per Tube Q8H  . sodium chloride  10-40 mL Intracatheter Q12H    Continuous Infusions: . 0.9 % NaCl with KCl 20 mEq / L 75 mL/hr at 01/02/13 1700  . feeding supplement (PIVOT 1.5 CAL) 1,000 mL (01/03/13 0218)  . fentaNYL infusion INTRAVENOUS Stopped (01/01/13 2300)  . sodium chloride 0.45 % 1,000 mL infusion  30 mL/hr at 12/29/12 [address], LDN, CNSC [phone] Pager [phone] After Hours Pager

## 2013-01-03 NOTE — Progress Notes (Signed)
Physical Therapy Treatment Patient Details Name: Kenyata Guess MRN: [id number] DOB: [birth year] Today's Date: 01/03/2013 Time: 0912-1004 PT Time Calculation (min): 52 min  PT Assessment / Plan / Recommendation Comments on Treatment Session  pt presents with fall (Etoh related) resulting in fronto-temporo-parietal SDH post Crani with bone flap in R UQ.  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  pt also with seizure from L Intraparenchymal Hematoma s/p 2nd Crani with drain in place.  pt with decreased restlessness today.  Spoke with RN who notes over night he was given Ativan and Fentenyl.  Prior to [facility] and mobility pt with 5/8 correct yes/no responses, but continues to only whisper making verbalizations difficult to understand.  At times appears to be mouthing curse words, but no audible responses.      Follow Up Recommendations  CIR     Does the patient have the potential to tolerate intense rehabilitation     Barriers to Discharge        Equipment Recommendations  None recommended by PT    Recommendations for Other Services Rehab consult  Frequency Min 3X/week   Plan Discharge plan remains appropriate;Frequency remains appropriate    Precautions / Restrictions Precautions Precautions: Fall Precaution Comments: pt with L Crani with drain and bone flap in RT UQ.  JP drain, trach peg Restrictions Weight Bearing Restrictions: No   Pertinent Vitals/Pain Grimaces at times, but unable to determine if painful.      Mobility  Bed Mobility Bed Mobility: Supine to Sit;Sitting - Scoot to Edge of Bed;Sit to Supine;Scooting to [facility];Rolling Left;Rolling Right Rolling Right: 1: +2 Total assist Rolling Right: Patient Percentage: 0% Rolling Left: 1: +2 Total assist Rolling Left: Patient Percentage: 20% Supine to Sit: HOB elevated;1: +2 Total assist Supine to Sit: Patient Percentage: 20% Sitting - Scoot to Edge of Bed: 1: +2 Total assist Sitting - Scoot to Edge  of Bed: Patient Percentage: 10% Sit to Supine: 1: +2 Total assist;HOB flat Sit to Supine: Patient Percentage: 10% Scooting to HOB: 1: +2 Total assist Scooting to [facility]: Patient Percentage: 0% Details for Bed Mobility Assistance: Pt less restless and less participation with bed mobility.  Performed rolling for peri hygiene as pt had loose BM.  pt did A with holding head off of pillow while on L side.   Transfers Transfers: Not assessed Ambulation/Gait Ambulation/Gait Assistance: Not tested (comment) Stairs: No Wheelchair Mobility Wheelchair Mobility: No    Exercises     PT Diagnosis:    PT Problem List:   PT Treatment Interventions:     PT Goals Acute Rehab PT Goals Time For Goal Achievement: 01/16/13 Potential to Achieve Goals: Good PT Goal: Supine/Side to Sit - Progress: Progressing toward goal PT Goal: Sit at Edge Of Bed - Progress: Progressing toward goal PT Goal: Sit to Supine/Side - Progress: Progressing toward goal Additional Goals PT Goal: Additional Goal #1 - Progress: Progressing toward goal PT Goal: Additional Goal #2 - Progress: Progressing toward goal  Visit Information  Last PT Received On: 01/03/13 Assistance Needed: +3 or more PT/OT Co-Evaluation/Treatment: Yes    Subjective Data  Subjective: PMSV on with pt whispering words.     Cognition  Cognition Arousal/Alertness: Awake/alert Behavior During Therapy: Flat affect Overall Cognitive Status: Impaired/Different from baseline Area of Impairment: Rancho level Current Attention Level: Focused Following Commands: Follows one step commands inconsistently Difficult to assess due to: Tracheostomy Rancho Levels of Cognitive Functioning Rancho Los Amigos Scales of Cognitive Functioning:  Confused/agitated    Balance  Balance Balance Assessed: Yes Static Sitting Balance Static Sitting - Balance Support: Bilateral upper extremity supported;Feet supported Static Sitting - Level of Assistance: 1: +1 Total  assist Static Sitting - Comment/# of Minutes: pt needs Bil LEs blocked as pt tending to push posteriorly into extension.    End of Session PT - End of Session Equipment Utilized During Treatment: Oxygen (Trach Collar) Activity Tolerance: Treatment limited secondary to agitation Patient left: in bed;with call bell/phone within reach;with bed alarm set;with restraints reapplied Nurse Communication: Mobility status   GP     Sunny Schlein, [place] [phone] 01/03/2013, 1:32 PM

## 2013-01-03 NOTE — PMR Pre-admission (Signed)
PMR Admission Coordinator Pre-Admission Assessment  Patient: Albert [name] is an 37 y.o., male MRN: [id number] DOB: November 24, 1975 Height: 5\' 11"  (180.3 cm) Weight: 83.3 kg (183 lb 10.3 oz)              Insurance Information HMO:      PPO: Yes     PCP:       IPA:       80/20:       OTHER:  Group # [id number] PRIMARY: [employer] [place]      Policy#: [id number]      Subscriber: Conrad Hatfield CM Name: Belinda Fisher      Phone#: [phone]     Fax#: [phone] Pre-Cert#: [id number] From 04/24 to 05/08  Update on 01/15/13     Employer:  [employer] Benefits:  Phone #: [phone]     Name: Leeroy Cha. Date: 09/11/12     Deduct: $3500(met$1710.93)      Out of Pocket Max: $3000($0 met)      Life Max: unlimited CIR: 70% w/auth      SNF: 70% w/auth  60 days limit Outpatient: 30 visits combined     Co-Pay: $50 per visit Home Health: 70% w/auth      Co-Pay: 30% DME: 70%     Co-Pay: 30% Providers: in network   Emergency Contact Information Contact Information   Name Relation Home Work Mobile   Macquarrie,Luana  [phone]     Raska,Linda Mother [phone]  [phone]     Current Medical History  Patient Admitting Diagnosis:Large left subdural hematoma and subarachnoid hemorrhage after a fall on 12/15/2012    History of Present Illness:  A 37 y.o. right-handed who male with unremarkable past medical history. Admitted 12/15/2012 after a fall getting out of of an automobile and was unresponsive with 911 called and patient was intubated. Alcohol level upon admission of 368. Cranial CT scan showed a large left subdural hematoma with significant midline shift left to right as well as significant subarachnoid hemorrhage. Patient also was noted skull fracture extending from the left temporal bone inferiorly to involve the sphenoid bone and superiorly to the vertex. CT maxillofacial with numerous fractures. Underwent left frontotemporal parietal craniotomy evacuation of acute subdural hematoma with  insertion of bone flap in the abdominal wall left upper quadrant 12/15/2012 per Dr. Jeral Fruit. Followup ENT( Dr.Gore) for numerous facial fractures advise conservative care. Patient had been on Keppra for seizure prophylaxis and on 12/28/2012 patient actively seizing and Dilantin was added to patient regimen as well Vimpat. Followup cranial CT scan shows left frontal intracerebral hematoma with subdural hematoma increasing shift and mass affect. It was elected that patient return back to the operating room and underwent redo craniotomy for ICH and SDH 12/28/2012 per Dr. Venetia Maxon. Latest followup cranial CT scan 12/30/2012 showing improvement in midline shift as well as hypodensity left anterior thalamus felt most likely to represent acute infarct. No further bouts of seizure activity noted and followup per neurology services. Latest followup prolonged EEG showed no evidence of electrographic seizures. Nasogastric tube in place for nutritional support and ultimately required gastrostomy PEG tube as well as tracheostomy for airway protection 12/31/2012 per Dr. Janee Morn and presently has a #8 Shiley trach tube in place.Marland Kitchen Physical and occupational therapy evaluations have been completed with recommendations of physical medicine rehabilitation consult to consider inpatient rehabilitation services. Patient was felt to be a good candidate for inpatient rehabilitation services and was admitted for a comprehensive rehabilitation program.  Update: Patient has progressed  from a Ranchos 2 when he was evaluated by rehab MD to a Ranchos 4.  He is awake more, mouthing words,  Intermittently following commands, restless,   participating more with therapies.  Requiring total assist +2 for mobility.  He is ready to participate more fully in our structured inpatient rehab program for TBI patient.  Past Medical History  Past Medical History  Diagnosis Date  . Headache     Family History  family history is not on file.  Prior  Rehab/Hospitalizations: Had outpatient therapy 5 yrs ago after R arm wrist surgery.   Current Medications  Current facility-administered medications:0.9 % NaCl with KCl 20 mEq/ L  infusion, , Intravenous, Continuous, Maeola Harman, MD, Last Rate: 75 mL/hr at 01/02/13 1700;  acetaminophen (TYLENOL) solution 650 mg, 650 mg, Per Tube, Q4H PRN, Freeman Caldron, PA-C, 650 mg at 01/02/13 2252;  acetaminophen (TYLENOL) suppository 650 mg, 650 mg, Rectal, Q6H PRN, Alyson Reedy, MD antiseptic oral rinse (BIOTENE) solution 15 mL, 15 mL, Mouth Rinse, QID, Zigmund Gottron, MD, 15 mL at 01/03/13 1610;  bromocriptine (PARLODEL) tablet 2.5 mg, 2.5 mg, Per Tube, Daily, Freeman Caldron, PA-C, 2.5 mg at 01/02/13 1224;  chlorhexidine (PERIDEX) 0.12 % solution 15 mL, 15 mL, Mouth Rinse, BID, Zigmund Gottron, MD, 15 mL at 01/03/13 0830 diphenhydrAMINE (BENADRYL) injection 25 mg, 25 mg, Intravenous, Q6H PRN, Liz Malady, MD, 25 mg at 12/20/12 1319;  feeding supplement (PIVOT 1.5 CAL) liquid 1,000 mL, 1,000 mL, Per Tube, Continuous, Liz Malady, MD, Last Rate: 55 mL/hr at 01/03/13 0218, 1,000 mL at 01/03/13 0218;  feeding supplement (PRO-STAT SUGAR FREE 64) liquid 30 mL, 30 mL, Per Tube, TID, Freeman Caldron, PA-C, 30 mL at 01/03/13 0831 fentaNYL (SUBLIMAZE) 10 mcg/mL in sodium chloride 0.9 % 250 mL infusion, 25-400 mcg/hr, Intravenous, Continuous, Axel Filler, MD, 175 mcg/hr at 01/01/13 2206;  fentaNYL (SUBLIMAZE) injection 50-100 mcg, 50-100 mcg, Intravenous, Q2H PRN, Alyson Reedy, MD, 100 mcg at 01/02/13 2253;  free water 200 mL, 200 mL, Per Tube, Q6H, Liz Malady, MD, 200 mL at 01/03/13 0612 hydrALAZINE (APRESOLINE) injection 10 mg, 10 mg, Intravenous, Q4H PRN, Lonia Farber, MD, 10 mg at 12/22/12 2343;  insulin aspart (novoLOG) injection 0-15 Units, 0-15 Units, Subcutaneous, Q4H, Karn Cassis, MD;  Ipratropium-Albuterol (COMBIVENT) respimat 2 puff, 2 puff, Inhalation,  Q6H PRN, Liz Malady, MD labetalol (NORMODYNE,TRANDATE) injection 10-40 mg, 10-40 mg, Intravenous, Q10 min PRN, Karn Cassis, MD, 20 mg at 12/23/12 0746;  lacosamide (VIMPAT) 150 mg in sodium chloride 0.9 % 25 mL IVPB, 150 mg, Intravenous, Q12H, Ritta Slot, MD, 150 mg at 01/02/13 2120;  levETIRAcetam (KEPPRA) 1,500 mg in sodium chloride 0.9 % 100 mL IVPB, 1,500 mg, Intravenous, Q12H, Noel Christmas, 1,500 mg at 01/03/13 0831 LORazepam (ATIVAN) bolus via infusion 1 mg, 1 mg, Intravenous, Once, Mariam Dollar, MD;  LORazepam (ATIVAN) injection 0.5-1 mg, 0.5-1 mg, Intravenous, Q4H PRN, Liz Malady, MD, 1 mg at 01/03/13 0022;  multivitamin liquid 5 mL, 5 mL, Oral, Daily, Alyson Reedy, MD, 5 mL at 01/02/13 0915;  ondansetron (ZOFRAN) injection 4 mg, 4 mg, Intravenous, Q4H PRN, Karn Cassis, MD ondansetron [facility]) tablet 4 mg, 4 mg, Oral, Q4H PRN, Karn Cassis, MD;  pantoprazole sodium (PROTONIX) 40 mg/20 mL oral suspension 40 mg, 40 mg, Per Tube, Daily, Freeman Caldron, PA-C, 40 mg at 01/02/13 0915;  phenytoin (DILANTIN) injection 100 mg, 100 mg, Intravenous, Q8H, Corliss Blacker  Amada Jupiter, MD, 100 mg at 01/03/13 4098;  promethazine (PHENERGAN) tablet 12.5-25 mg, 12.5-25 mg, Oral, Q4H PRN, Karn Cassis, MD propranolol (INDERAL) 20 MG/5ML solution 80 mg, 80 mg, Per Tube, Q8H, Freeman Caldron, PA-C, 80 mg at 01/03/13 0620;  sodium chloride 0.45 % 1,000 mL infusion, , Intravenous, Continuous, Liz Malady, MD, Last Rate: 30 mL/hr at 12/29/12 1800;  sodium chloride 0.9 % injection 10-40 mL, 10-40 mL, Intracatheter, Q12H, Liz Malady, MD, 10 mL at 01/02/13 2200 sodium chloride 0.9 % injection 10-40 mL, 10-40 mL, Intracatheter, PRN, Liz Malady, MD, 10 mL at 01/01/13 1191  Patients Current Diet: NPO with PEG tube feedings  Precautions / Restrictions Precautions Precautions: Fall Precaution Comments: pt with L Crani with drain and bone flap in RT UQ.  JP drain, trach  peg Restrictions Weight Bearing Restrictions: No   Prior Activity Level Community (5-7x/wk): Went out daily.  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 Pt following simple commands for wiping face with wash cloth. Pt pitting throughtout session.  Attention: Focused Focused Attention: Impaired Focused Attention Impairment: Verbal basic;Functional basic Rancho Mirant Scales of Cognitive Functioning: Confused/agitated    Extremity Assessment (includes Sensation/Coordination)  RUE ROM/Strength/Tone: Deficits;Due to impaired cognition       ADLs  Eating/Feeding: NPO Grooming: Wash/dry face;Moderate assistance (hand over hand with wash cloth) Where Assessed - Grooming: Supported sitting Lower Body Dressing: +1 Total assistance Where Assessed - Lower Body Dressing: Supine, head of bed up Transfers/Ambulation Related to ADLs: not attempted ADL Comments: Pt positioned on the right side of the bed with eye positioned in midline. Pt restless. pt noted to have leg restraint on LT LE and LT UE. Pt positioned at EOB and needed constant tactile input to prevent Lt UE from pulling at lines/ leads. Pt with wrist restraint left on so that therapist could allow movement of LT UE and restrict any unsafe movement  quickly. Pt mouthing throoughout session. PMV placed my SLP with cuff deflated. Pt verbalized unintelligible much of the session. Pt nodding head yes and no during session. Pt following simple commands less than 25% of session. Pt wiping face with command but question automatic response v/s following the command. Pt remains with visual deficits. pt eyes midline and deviated left. Pt appears to localize to sound by turning head slightly to wife's voice during session. Pt turning head left with OT speaking from left side behind supporting patient. Pt not blinking to threat at this time. Pt facial grimace several times during session. RN removed lead sticker and patient mouthed "what is wrong with you?" but not visually attending to RN. Pt with clear fluids exiting JP site throughout session. RN made aware of risk for sink break down at restraints due to restless state. WIfe asking detailed appropriate questions regarding Rancho level IV vs V and potential d/c options. patients mother arriving and asking questions regarding room setup and visitors. A sign was placed on the door to  limit visitors and increase the amount of quiet uninterrupted time for the patient. Pt currently in Rancho Coma recovery level IV.      Mobility  Bed Mobility: Supine to Sit;Sitting - Scoot to [place] of Bed;Sit to Supine;Scooting to [facility] Rolling Right: 1: +2 Total assist Rolling Right: Patient Percentage: 0% Rolling Left: 1: +2 Total assist Rolling Left: Patient Percentage: 0% Supine to Sit: 1: +2 Total assist;HOB elevated Supine to Sit: Patient Percentage: 30% Sitting - Scoot to Edge of Bed: 1: +2 Total assist Sitting - Scoot to Edge of Bed: Patient Percentage: 10% Sit to Supine: 1: +2 Total assist;HOB flat Sit to Supine: Patient Percentage: 30% Scooting to HOB: 1: +2 Total assist Scooting to [facility]: Patient Percentage: 10%    Transfers  Transfers: Not assessed Sit to Stand: 1: +2 Total assist;From bed;From elevated surface Sit  to Stand: Patient Percentage: 0% Stand to Sit: 1: +2 Total assist;To bed Stand to Sit: Patient Percentage: 0% Transfer via Lift Equipment: Sport and exercise psychologist / Gait / Stairs / Psychologist, prison and probation services  Ambulation/Gait Ambulation/Gait Assistance: Not tested (comment) Stairs: No Corporate treasurer: No    Posture / Games developer Sitting - Balance Support: Bilateral upper extremity supported;Feet supported Static Sitting - Level of Assistance: 4: Min assist;1: +2 Total assist Static Sitting - Comment/# of Minutes: pt again fluctuating between MinA and +2 A to maintain balance sitting EOB. pt required blocking at knees and strong A at back to prevent pt from scooting hips anteriorly off of bed. ? some movement noted in R LE during sitting, however unclear 2/2 strong movements at trunk. While sitting pt able to move eyes past midline towards R side, but still unclear level of visual deficits. pt not repsonding to visual threat. pt directing attention to auditory stimuli at times pending level of agitation. pt spitting while sitting 2/2 increased cough and secretions.     Special needs/care consideration BiPAP/CPAP No CPM No Continuous Drip IV Has 0.9% NS with KCL 20 meq/L 75/hr Dialysis No       Life Vest No Oxygen Trach collar at 40% Special Bed no Trach Size Trach #8 Wound Vac (area) No      Skin Developed a rash from a medication, had benedryl, rash now better.  Has scalp incision L crani site and R abdomen with skull bone flap in place.  Has a JP drain in the back of his head.                             Bowel mgmt: Incontinent of stool Bladder mgmt: Urinary catheter in place Diabetic mgmt No    Previous Home Environment Living Arrangements: Spouse/significant other Lives With: Spouse Available Help at Discharge: Family;Available 24 hours/day Home Care Services: No  Discharge Living Setting Plans for Discharge Living Setting: Patient's  home;House;Lives with (comment) (Lives with wife and 73 yo daughter.  Wife is from [place].) Type of Home at Discharge: House Discharge Home Layout: Two level;1/2 bath on main level;Bed/bath upstairs Alternate Level Stairs-Number of Steps: 12-15 steps with a landing in between steps. Discharge Home Access: Stairs to enter Entrance Stairs-Number of Steps: 3 steps garage entry and 5-6 steps at front entry Do you have any problems obtaining your medications?: No  Social/Family/Support Systems Patient Roles: Spouse;Parent (Has been with wife for 14 years.) Contact Information: Szymon Foiles - wife (c) [phone] Anticipated Caregiver: wife, patient's parents, maybe patient's sister  Ability/Limitations of Caregiver: Wife works with [employer], can work from home. Caregiver Availability: 24/7 Discharge Plan Discussed with Primary Caregiver: Yes Is Caregiver In Agreement with Plan?: Yes Does Caregiver/Family have Issues with Lodging/Transportation while Pt is in Rehab?: No  Goals/Additional Needs Patient/Family Goal for Rehab: PT/OT min/mod A, OT S/min A goals Expected length of stay: 3-4 weeks Cultural Considerations: None Dietary Needs: Peg in place for tube feedings Equipment Needs: TBD Pt/Family Agrees to Admission and willing to participate: Yes Program Orientation Provided & Reviewed with Pt/Caregiver Including Roles  & Responsibilities: Yes   Decrease burden of Care through IP rehab admission: Decannulation, Diet advancement, Decrease number of caregivers, Bowel and bladder program and Patient/family education   Possible need for SNF placement upon discharge: Yes.  Wife will need to pull together caregiver support for home discharge after inpatient rehab stay.  She is aware of need for caregiver support upon discharge.   Patient Condition: This patient's medical and functional status has changed since the consult dated: 12/23/12 in which the Rehabilitation Physician determined and  documented that the the patient was not at a level where he could participate fully in an inpatient rehab program.   See "History of Present Illness" (above) for medical update. Functional changes are: Continues to required total assist + 2 for all mobility. Patient's medical and functional status update has been discussed with the Rehabilitation physician and patient remains appropriate for inpatient rehabilitation. Will admit to inpatient rehab today.  Preadmission Screen Completed By:  Trish Mage, 01/03/2013 10:16 AM ______________________________________________________________________   Discussed status with Dr. Riley Kill on 01/03/13 at [phone] and received telephone approval for admission today.  Admission Coordinator:  Trish Mage, time1023/Date04/25/14
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
## 2013-01-03 NOTE — Progress Notes (Signed)
Rehab admissions - I have authorization from [facility] for acute inpatient rehab admission.  Bed available and can admit to acute inpatient rehab today.  Call me for questions.  #[phone]

## 2013-01-03 NOTE — Progress Notes (Signed)
Patient ID: [name], male   DOB: 05/15/1976, 37 y.o.   MRN: [id number]   LOS: 20 days   Subjective: Not following commands today.   Objective: Vital signs in last 24 hours: Temp:  [98.1 F (36.7 C)-101.7 F (38.7 C)] 98.1 F (36.7 C) (04/25 0743) Pulse Rate:  [89-117] 89 (04/25 0743) Resp:  [14-27] 19 (04/25 0743) BP: (116-154)/(81-136) 135/97 mmHg (04/25 0743) SpO2:  [91 %-100 %] 99 % (04/25 0743) FiO2 (%):  [40 %] 40 % (04/25 0437) Weight:  [183 lb 10.3 oz (83.3 kg)] 183 lb 10.3 oz (83.3 kg) (04/25 0437) Last BM Date: 01/02/13   Physical Exam General appearance: no distress Resp: clear to auscultation bilaterally Cardio: regular rate and rhythm GI: Soft, +BS, incisions C/D/I, PEG site clear   Assessment/Plan: Fall  TBI s/p decompressive craniectomy x2 -- per NS. Facial abrasions -- Local care  ABL anemia -- stable  Dispo -- To CIR today    Freeman Caldron, PA-C Pager: [phone] General Trauma PA Pager: [phone]   01/03/2013

## 2013-01-03 NOTE — Clinical Social Work Note (Signed)
Clinical Social Worker following for support and discharge planning assistance.  Patient has been accepted to [facility] and has received insurance authorization for admission.  Patient family in agreement with plan and feel very fortunate that patient was able to remain here for rehab needs.  Clinical Social Worker will sign off for now as social work intervention is no longer needed. Please consult [place] again if new need arises.  Macario Golds, [place] [phone]

## 2013-01-03 NOTE — Progress Notes (Signed)
INITIAL NUTRITION ASSESSMENT  DOCUMENTATION CODES Per approved criteria  -Not Applicable   INTERVENTION: 1. Increase Pivot 1.5 by 10 ml every 4 hours to goal rate of 90 ml/hr x 20 hours  2.D/C Prostat   3. Increase free water to 350 ml every 6 hours to provide 1 ml/kcal   TF regimen will provide: 2700 kcal (100% of needs) and 168 grams protein ( >100% of minimum needs), and 1366 ml H2O. Total free water (tube feeding and free water flushes): 2166 ml.   NUTRITION DIAGNOSIS: Inadequate oral intake related to inability to eat as evidenced by NPO status.   Goal: Pt to meet >/= 90% of their estimated nutrition needs.   Monitor:  TF tolerance, weight, labs  Reason for Assessment: Consult received to initiate and manage enteral nutrition support.  37 y.o. male  Admitting Dx: TBI (traumatic brain injury)  ASSESSMENT: Pt admitted after fall, positive for ETOH. Has SDH and SAH, multiple facial fractures, s/p decompressive craniectomy of L SDH. Pt with questionable cardiac arrest with CPR. Pt extubated 4/14, developed seizures and required second craniectomy. Pt had trach and PEG 4/22 and extubated on 4/24 and remains on trach collar.  Pt has had persistent fevers this admission due to neuro storming.  Pt with 20 lb weight loss per records, likely due to TF being held for intubation/extubation/surgery.  Unable to perform nutrition-focused physical exam at this time.    Height: Ht Readings from Last 1 Encounters:  01/01/13 5\' 11"  (1.803 m)    Weight: Wt Readings from Last 1 Encounters:  01/03/13 183 lb 10.3 oz (83.3 kg)    Ideal Body Weight: 78.1 kg  % Ideal Body Weight: 107%  Wt Readings from Last 10 Encounters:  01/03/13 183 lb 10.3 oz (83.3 kg)  01/03/13 183 lb 10.3 oz (83.3 kg)  01/03/13 183 lb 10.3 oz (83.3 kg)  01/03/13 183 lb 10.3 oz (83.3 kg)  01/03/13 183 lb 10.3 oz (83.3 kg)    Usual Body Weight: 203-204 lb per wife  % Usual Body Weight: 90%  BMI:  There  is no weight on file to calculate BMI. BMI: 25.5 - overweight  Estimated Nutritional Needs: Kcal: 2700-2800 Protein: 150-170 Fluid: > 2.7 L/day  Skin: incisions  Diet Order: NPO  EDUCATION NEEDS: -No education needs identified at this time   Intake/Output Summary (Last 24 hours) at 01/03/13 1532 Last data filed at 01/03/13 1200  Gross per 24 hour  Intake   1955 ml  Output   1130 ml  Net    825 ml    Last BM: 4/24   Labs:   Recent Labs Lab 12/30/12 1103 01/01/13 0515 01/02/13 0510  NA 146* 140 140  K 3.9 3.4* 3.6  CL 113* 107 103  CO2 27 25 29   BUN 28* 10 10  CREATININE 0.70 0.52 0.53  CALCIUM 8.6 7.7* 8.9  GLUCOSE 135* 91 136*    CBG (last 3)   Recent Labs  01/03/13 0414 01/03/13 0743 01/03/13 1134  GLUCAP 147* 159* 128*    Scheduled Meds: . antiseptic oral rinse  15 mL Mouth Rinse QID  . [START ON 01-22-13] bromocriptine  2.5 mg Per Tube Daily  . chlorhexidine  15 mL Mouth Rinse BID  . feeding supplement  30 mL Per Tube TID  . free water  200 mL Per Tube Q6H  . insulin aspart  0-15 Units Subcutaneous Q4H  . lacosamide  100 mg Per Tube BID  . levETIRAcetam  1,500  mg Intravenous Q12H  . [START ON 2013-01-22] multivitamin  5 mL Oral Daily  . pantoprazole sodium  40 mg Per Tube Daily  . phenytoin  100 mg Per Tube TID  . propranolol  80 mg Per Tube Q8H    Continuous Infusions: . feeding supplement (PIVOT 1.5 CAL)      Past Medical History  Diagnosis Date  . Headache     Past Surgical History  Procedure Laterality Date  . Craniotomy Left 12/15/2012    Procedure: CRANIECTOMY HEMATOMA EVACUATION SUBDURAL WITH PLACEMENT OF BONE FLAP IN ABDOMINAL WALL;  Surgeon: Karn Cassis, MD;  Location: [facility] NEURO ORS;  Service: Neurosurgery;  Laterality: Left;  . Craniotomy Left 12/28/2012    Procedure: CRANIOTOMY HEMATOMA EVACUATION SUBDURAL;  Surgeon: Maeola Harman, MD;  Location: [facility] NEURO ORS;  Service: Neurosurgery;  Laterality: Left;  . Peg placement  N/A 12/31/2012    Procedure: PERCUTANEOUS ENDOSCOPIC GASTROSTOMY (PEG) PLACEMENT;  Surgeon: Liz Malady, MD;  Location[facility] ENDOSCOPY;  Service: General;  Laterality: N/A;  bedside  peg    Kendell Bane RD, LDN, CNSC [phone] Pager [phone] After Hours Pager

## 2013-01-03 NOTE — Progress Notes (Signed)
Patient ID: Albert [name], male   DOB: 1976/04/05, 37 y.o.   MRN: [id number] Drain removed, wound dry. Neuro stable. Tolerating PEG. TRACH SITE CLEAN / SEEN BY REHABILITATION MEDICINE.  He is going to be transferred to the rehab unit. i did talk with the family. i will continue to follow him while in the unit

## 2013-01-03 NOTE — Discharge Summary (Signed)
Ilyas Lipsitz, MD, MPH, FACS Pager: [phone]  

## 2013-01-03 NOTE — Progress Notes (Signed)
Speech Language Pathology Treatment Patient Details Name: Albert [name] MRN: [id number] DOB: 1976/06/18 Today's Date: 01/03/2013 Time: 0915-1010 SLP Time Calculation (min): 55 min  Assessment / Plan / Recommendation Clinical Impression  Treatment focused on cognitive recovery, Pt demonstrating behaviors consistent with a Ranchos level IV, (confused, agitated). SLP placed PMSV for 10 to 25 minute intervals, initially with cough, but no evidence of CO2 trapping or change in vital signs. With PMSV in place pts verbalizations were non purposeful, occasionally agitated. Phonation intermittently present, but pts effort for breath support was poor despite max verbal and contextual cues. Pt was able to respond yes with head nod or whisper to basic biographical questions correctly in 5/8 attempts. Attention is focused but not sustained. Overall, pt remains restless and occasionally verbally agitated though with appearance of reduced arousal, especially at the end of session. Question if sedating medications could be playing a role in pts presentation today. Did not attempt POs as pt did not evidence improved cognition and will transition to CIR today. WIll defer to that setting.     SLP Plan  Continue with current plan of care    Pertinent Vitals/Pain NA  SLP Goals  SLP Goals Potential to Achieve Goals: Good Potential Considerations: Severity of impairments Progress/Goals/Alternative treatment plan discussed with pt/caregiver and they: Agree SLP Goal #1: Pt will follow one step commands with max verbal/tactile cues x3.  SLP Goal #1 - Progress: Progressing toward goal SLP Goal #2: Pt will sustain attention to basic functional task for 30 seconds with max contextual cues.  SLP Goal #2 - Progress: Progressing toward goal SLP Goal #3: Pt will verbalize at word level (following extubation ) with max verbal/contextual cues.  SLP Goal #3 - Progress: Progressing toward goal  General Temperature  Spikes Noted: No Respiratory Status: Trach Behavior/Cognition: Alert Oral Cavity - Dentition: Adequate natural dentition Patient Positioning: Partially reclined (edge of bed)  Oral Cavity - Oral Hygiene     Treatment Treatment focused on: Cognition;Coma recovery (PMSV)   GO    Harlon Ditty, MA CCC-SLP [phone]  Claudine Mouton 01/03/2013, 10:56 AM
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
## 2013-01-03 NOTE — Progress Notes (Signed)
NEURO HOSPITALIST PROGRESS NOTE   SUBJECTIVE:                                                                                                                        Alert. No new neurological developments. On keppra, vimpat, and dilantin.  OBJECTIVE:                                                                                                                           Vital signs in last 24 hours: Temp:  [98.1 F (36.7 C)-101.7 F (38.7 C)] 98.1 F (36.7 C) (04/25 0743) Pulse Rate:  [89-117] 89 (04/25 0743) Resp:  [14-27] 19 (04/25 0743) BP: (116-154)/(81-136) 135/97 mmHg (04/25 0743) SpO2:  [91 %-100 %] 99 % (04/25 0743) FiO2 (%):  [40 %] 40 % (04/25 0437) Weight:  [83.3 kg (183 lb 10.3 oz)] 83.3 kg (183 lb 10.3 oz) (04/25 0437)  Intake/Output from previous day: 04/24 0701 - 04/25 0700 In: 4325 [I.V.:1800; NG/GT:2065; IV Piggyback:250] Out: 2155 [Urine:2100; Drains:55] Intake/Output this shift:   Nutritional status:    Past Medical History  Diagnosis Date  . Headache       Neurologic Exam:  Mental status: alert and awake but doesn't follow commands. CN 2-12: intact. Motor: right hemiparesis. Sensory: no tested. DTR's: 1+ all over. Plantars: no tested.  Lab Results: No results found for this basename: cbc, bmp, coags, chol, tri, ldl, hga1c   Lipid Panel No results found for this basename: CHOL, TRIG, HDL, CHOLHDL, VLDL, LDLCALC,  in the last 72 hours  Studies/Results: No results found.  MEDICATIONS  I have reviewed the patient's current medications.  ASSESSMENT/PLAN:                                                                                                           37 y/o status post removal left hemispheric SDH and symptomatic seizures, controlled with 3 anticonvulsants. Will attempt to control  seizures with monotherapy if feasible. Thus, suggest decreasing vimpat by 50 mg every 3 days until he is off vimpat and then will make further recommendations accordingly.   Wyatt Portela, MD [facility] [phone]  01/03/2013, 9:42 AM

## 2013-01-04 ENCOUNTER — Inpatient Hospital Stay (HOSPITAL_COMMUNITY): Payer: BC Managed Care – PPO | Admitting: Physical Therapy

## 2013-01-04 ENCOUNTER — Inpatient Hospital Stay (HOSPITAL_COMMUNITY): Payer: BC Managed Care – PPO | Admitting: Speech Pathology

## 2013-01-04 ENCOUNTER — Inpatient Hospital Stay (HOSPITAL_COMMUNITY): Payer: BC Managed Care – PPO

## 2013-01-04 DIAGNOSIS — M7989 Other specified soft tissue disorders: Secondary | ICD-10-CM

## 2013-01-04 LAB — GLUCOSE, CAPILLARY
Glucose-Capillary: 148 mg/dL — ABNORMAL HIGH (ref 70–99)
Glucose-Capillary: 160 mg/dL — ABNORMAL HIGH (ref 70–99)
Glucose-Capillary: 148 mg/dL — ABNORMAL HIGH (ref 70–99)
Glucose-Capillary: 173 mg/dL — ABNORMAL HIGH (ref 70–99)
Glucose-Capillary: 135 mg/dL — ABNORMAL HIGH (ref 70–99)
Glucose-Capillary: 134 mg/dL — ABNORMAL HIGH (ref 70–99)

## 2013-01-04 MED ORDER — CIPROFLOXACIN HCL 500 MG PO TABS
500.0000 mg | ORAL_TABLET | Freq: Two times a day (BID) | ORAL | Status: DC
  Filled 2013-01-04 (×3): qty 1

## 2013-01-04 MED ORDER — CIPROFLOXACIN HCL 500 MG PO TABS
500.0000 mg | ORAL_TABLET | Freq: Two times a day (BID) | ORAL | Status: DC
  Administered 2013-01-04: 500 mg via ORAL
  Filled 2013-01-04 (×4): qty 1

## 2013-01-04 MED ORDER — PROPRANOLOL HCL 20 MG/5ML PO SOLN
80.0000 mg | Freq: Three times a day (TID) | ORAL | Status: DC
  Administered 2013-01-04 – 2013-01-27 (×70): 80 mg
  Filled 2013-01-04 (×75): qty 20

## 2013-01-04 MED ORDER — PIVOT 1.5 CAL PO LIQD
1000.0000 mL | ORAL | Status: DC
  Administered 2013-01-04 – 2013-01-07 (×3): 1000 mL
  Filled 2013-01-04 (×13): qty 1000
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
## 2013-01-04 NOTE — Progress Notes (Signed)
VASCULAR LAB PRELIMINARY  PRELIMINARY  PRELIMINARY  PRELIMINARY  Bilateral lower extremity venous duplex completed.    Preliminary report:  Bilateral:  No evidence of DVT, superficial thrombosis, or Baker's Cyst.   Adileny Delon, RVS 01/17/2013, 3:57 PM
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
## 2013-01-04 NOTE — Progress Notes (Signed)
Physical Therapy Session Note  Patient Details  Name: Albert [name] MRN: [id number] Date of Birth: 05-24-1976  Today's Date: 01/22/13 Time: 1600-1630 Time Calculation (min): 30 min  Skilled Therapeutic Interventions/Progress Updates:  Ambulation/gait training;Balance/vestibular training;DME/adaptive equipment instruction;Functional mobility training;Neuromuscular re-education;Patient/family education;Stair training;Therapeutic Activities;Therapeutic Exercise;UE/LE Strength taining/ROM;UE/LE Coordination activities   Therapy Documentation Precautions:  Precautions: Fall Precaution Comments: L crani with drain, JP and trach. Foley Required Braces or Orthoses: Other Brace/Splint Other Brace/Splint: R heel suspension boot Restrictions Weight Bearing Restrictions: No Pain: sore throat only  Therapeutic Activity:(30') Transfer training w/c->bed with nursing assistance to unplug feeding tube and to guide lines during transfer, Max-Assist sit<->stand and patient able to take 3 steps with one person handheld Max-assist and sit EOB.  Sit->R sidelying Total assist, supine scooting to HOB total assist x 2. Rolling R and L total assist.  Patient fatigues quickly but did have short time of B LE's giving support in stand-pivot transfer.  Right heel suspension boot in place and nursing re-started PEG tube.  HOB at 30 degrees. Left UE and LE restraints applied and secured.  Wife of patient present throughout tx session.   See FIM for current functional status  Therapy/Group: Individual Therapy  Rex Kras Jan 22, 2013, 7:02 PM

## 2013-01-05 ENCOUNTER — Inpatient Hospital Stay (HOSPITAL_COMMUNITY): Payer: BC Managed Care – PPO | Admitting: Physical Therapy

## 2013-01-05 DIAGNOSIS — I609 Nontraumatic subarachnoid hemorrhage, unspecified: Secondary | ICD-10-CM

## 2013-01-05 LAB — GLUCOSE, CAPILLARY
Glucose-Capillary: 110 mg/dL — ABNORMAL HIGH (ref 70–99)
Glucose-Capillary: 111 mg/dL — ABNORMAL HIGH (ref 70–99)
Glucose-Capillary: 121 mg/dL — ABNORMAL HIGH (ref 70–99)
Glucose-Capillary: 133 mg/dL — ABNORMAL HIGH (ref 70–99)
Glucose-Capillary: 133 mg/dL — ABNORMAL HIGH (ref 70–99)
Glucose-Capillary: 141 mg/dL — ABNORMAL HIGH (ref 70–99)

## 2013-01-05 MED ORDER — LORAZEPAM 2 MG/ML IJ SOLN
1.0000 mg | Freq: Once | INTRAMUSCULAR | Status: AC
  Administered 2013-01-05: 1 mg via INTRAVENOUS

## 2013-01-05 NOTE — Plan of Care (Signed)
Problem: RH BLADDER ELIMINATION Goal: RH STG MANAGE BLADDER WITH ASSISTANCE STG Manage Bladder With Mod Assistance  Outcome: Not Progressing Currently wearing condom cath at all times with no awareness voiding

## 2013-01-05 NOTE — Plan of Care (Signed)
Problem: RH SAFETY Goal: RH STG ADHERE TO SAFETY PRECAUTIONS W/ASSISTANCE/DEVICE STG Adhere to Safety Precautions With max Assistance/Device.  Outcome: Not Progressing Currently requires SRx4, left mitten, and left soft wrist and ankle restraints to refrain from pulling lines and getting OOB

## 2013-01-05 NOTE — Plan of Care (Signed)
Problem: RH PAIN MANAGEMENT Goal: RH STG PAIN MANAGED AT OR BELOW PT'S PAIN GOAL Pain managed with prn medications  Outcome: Progressing Occassional nonverbal signs of pain, tylenol appears effective
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
## 2013-01-05 NOTE — Plan of Care (Signed)
Problem: RH SKIN INTEGRITY Goal: RH STG ABLE TO PERFORM INCISION/WOUND CARE W/ASSISTANCE STG Able To Perform Incision/Wound Care With max Assistance.  Outcome: Progressing Currently total assist for trach care, scalp incision care, abdominal incision care, and PEG tube care

## 2013-01-05 NOTE — Progress Notes (Signed)
Physical Therapy Note  Patient Details  Name: Albert [name] MRN: [id number] Date of Birth: Jun 28, 1976 Today's Date: 01/05/2013  1110- 1135 (25 minutes) individual Pain: no acute distress noted Focus of treatment: Therapeutic activities focused on static sitting balance Treatment: Pt in bed upon arrival ; Oxygen sats 99% ; supine to sit max assist +2; sitting edge of bed X 10 minutes with max assist for balance . No distress noted; sit to supine max assist +1; scooting up in bed total assist +2. ; bed alarm activated; left wrist and foot restraints applied; 4 bed rails up.    Asiya Cutbirth,JIM 01/05/2013, 11:37 AM

## 2013-01-05 NOTE — Plan of Care (Signed)
Problem: RH SKIN INTEGRITY Goal: RH STG MAINTAIN SKIN INTEGRITY WITH ASSISTANCE STG Maintain Skin Integrity With max Assistance.  Outcome: Not Progressing Currently total assist for turning/repositioning, perineal hygiene

## 2013-01-05 NOTE — Plan of Care (Signed)
Problem: RH BOWEL ELIMINATION Goal: RH STG MANAGE BOWEL WITH ASSISTANCE STG Manage Bowel with max Assistance.  Outcome: Not Progressing Currently incont, unaware requiring total assist for hygiene

## 2013-01-06 ENCOUNTER — Inpatient Hospital Stay (HOSPITAL_COMMUNITY): Payer: BC Managed Care – PPO | Admitting: *Deleted

## 2013-01-06 ENCOUNTER — Inpatient Hospital Stay (HOSPITAL_COMMUNITY): Payer: BC Managed Care – PPO | Admitting: Occupational Therapy

## 2013-01-06 ENCOUNTER — Encounter (HOSPITAL_COMMUNITY): Payer: BC Managed Care – PPO | Admitting: Occupational Therapy

## 2013-01-06 DIAGNOSIS — S069XAA Unspecified intracranial injury with loss of consciousness status unknown, initial encounter: Secondary | ICD-10-CM

## 2013-01-06 DIAGNOSIS — S069X9A Unspecified intracranial injury with loss of consciousness of unspecified duration, initial encounter: Secondary | ICD-10-CM

## 2013-01-06 LAB — CBC WITH DIFFERENTIAL/PLATELET
Basophils Absolute: 0 10*3/uL (ref 0.0–0.1)
Basophils Relative: 0 % (ref 0–1)
Eosinophils Absolute: 1.5 10*3/uL — ABNORMAL HIGH (ref 0.0–0.7)
Eosinophils Relative: 12 % — ABNORMAL HIGH (ref 0–5)
HCT: 34 % — ABNORMAL LOW (ref 39.0–52.0)
Hemoglobin: 11.5 g/dL — ABNORMAL LOW (ref 13.0–17.0)
Lymphocytes Relative: 14 % (ref 12–46)
Lymphs Abs: 1.8 10*3/uL (ref 0.7–4.0)
MCH: 27.9 pg (ref 26.0–34.0)
MCHC: 33.8 g/dL (ref 30.0–36.0)
MCV: 82.5 fL (ref 78.0–100.0)
Monocytes Absolute: 0.9 10*3/uL (ref 0.1–1.0)
Monocytes Relative: 7 % (ref 3–12)
Neutro Abs: 8.4 10*3/uL — ABNORMAL HIGH (ref 1.7–7.7)
Neutrophils Relative %: 66 % (ref 43–77)
Platelets: 530 10*3/uL — ABNORMAL HIGH (ref 150–400)
RBC: 4.12 MIL/uL — ABNORMAL LOW (ref 4.22–5.81)
RDW: 12.9 % (ref 11.5–15.5)
WBC: 12.8 10*3/uL — ABNORMAL HIGH (ref 4.0–10.5)

## 2013-01-06 LAB — GLUCOSE, CAPILLARY
Glucose-Capillary: 115 mg/dL — ABNORMAL HIGH (ref 70–99)
Glucose-Capillary: 125 mg/dL — ABNORMAL HIGH (ref 70–99)
Glucose-Capillary: 125 mg/dL — ABNORMAL HIGH (ref 70–99)
Glucose-Capillary: 130 mg/dL — ABNORMAL HIGH (ref 70–99)
Glucose-Capillary: 131 mg/dL — ABNORMAL HIGH (ref 70–99)
Glucose-Capillary: 164 mg/dL — ABNORMAL HIGH (ref 70–99)

## 2013-01-06 LAB — COMPREHENSIVE METABOLIC PANEL
ALT: 43 U/L (ref 0–53)
AST: 35 U/L (ref 0–37)
Albumin: 3 g/dL — ABNORMAL LOW (ref 3.5–5.2)
Alkaline Phosphatase: 80 U/L (ref 39–117)
BUN: 21 mg/dL (ref 6–23)
CO2: 28 mEq/L (ref 19–32)
Calcium: 10.1 mg/dL (ref 8.4–10.5)
Chloride: 103 mEq/L (ref 96–112)
Creatinine, Ser: 0.57 mg/dL (ref 0.50–1.35)
GFR calc Af Amer: >90 mL/min (ref >90)
GFR calc non Af Amer: >90 mL/min (ref >90)
Glucose, Bld: 167 mg/dL — ABNORMAL HIGH (ref 70–99)
Potassium: 4 mEq/L (ref 3.5–5.1)
Sodium: 141 mEq/L (ref 135–145)
Total Bilirubin: 0.2 mg/dL — ABNORMAL LOW (ref 0.3–1.2)
Total Protein: 7.8 g/dL (ref 6.0–8.3)

## 2013-01-06 LAB — URINE CULTURE: Colony Count: 30000

## 2013-01-06 MED ORDER — METHYLPHENIDATE HCL 5 MG PO TABS
10.0000 mg | ORAL_TABLET | Freq: Two times a day (BID) | ORAL | Status: DC
  Administered 2013-01-06 – 2013-01-13 (×14): 10 mg
  Filled 2013-01-06 (×14): qty 2

## 2013-01-06 MED ORDER — SACCHAROMYCES BOULARDII 250 MG PO CAPS
250.0000 mg | ORAL_CAPSULE | Freq: Two times a day (BID) | ORAL | Status: DC
  Administered 2013-01-06 – 2013-02-07 (×65): 250 mg via ORAL
  Filled 2013-01-06 (×71): qty 1

## 2013-01-06 NOTE — Progress Notes (Signed)
Physical Therapy Note  Patient Details  Name: TYDUS SANMIGUEL MRN: [id number] Date of Birth: 1976/06/05 Today's Date: 01/06/2013  4098-1191 (55 minutes) individual Pain: no distress noted Focus of treatment: standing tolerance Treatment: Pt up in reclining wc ; Oxygen sats 94% RA , BP sitting 132/83 pulse 94; sit to stand X 2 to standing frame ( pt attempts to push to right with left extremities) . Pt tolerated standing with mod assist to maintain trunk extension (pushes posteriorly) for approximately 4-5 minutes per session; BP in standing 141/ 90  Pulse 110. ; seated peg board hand over hand right or left with no active participation or attention to task.    Oleva Koo,JIM 01/06/2013, 9:44 AM

## 2013-01-06 NOTE — Progress Notes (Addendum)
Subjective/Complaints: Uneventful night. Slow to awaken this am.   Objective: Vital Signs: Blood pressure 140/82, pulse 97, temperature 99.3 F (37.4 C), temperature source Axillary, resp. rate 20, height 6' (1.829 m), weight 75.8 kg (167 lb 1.7 oz), SpO2 96.00%. No results found.  Recent Labs  01/06/13 0700  WBC 12.8*  HGB 11.5*  HCT 34.0*  PLT 530*   No results found for this basename: NA, K, CL, CO, GLUCOSE, BUN, CREATININE, CALCIUM,  in the last 72 hours CBG (last 3)   Recent Labs  01/05/13 2046 01/06/13 0050 01/06/13 0407  GLUCAP 111* 131* 115*    Wt Readings from Last 3 Encounters:  01/06/13 75.8 kg (167 lb 1.7 oz)  01/03/13 83.3 kg (183 lb 10.3 oz)  01/03/13 83.3 kg (183 lb 10.3 oz)    Physical Exam:  Eyes:  Pupils reactive to light  Neck: Neck supple. No thyromegaly present.  Cardiovascular: Normal rate and regular rhythm.  Pulmonary/Chest: Effort normal and breath sounds normal. No respiratory distress.  Abdominal: Soft. Bowel sounds are normal. He exhibits no distension.  Neurological: GCS eye subscore is 4. GCS verbal subscore is 1. GCS motor subscore is 4--total score of 9.  Patient sitting in bed nonverbal would not follow commands with left gaze preference. Patient was noted to be agitated and restless. Able to occasionally make eye contact with me when cued. Attention only a few seconds withdrawal to pinch in the left upper and left lower extremity only with facial grimaces noted. Right upper flexor synergy and right lower extremity with extensor posturing. RUE tone is 2-3 Could not perform manual muscle testing secondary to inability to cooperate  Skin:  Craniotomy site with drainage substantial serous drainage on the pillow. Peg and donor sites on abdomen are intact with only minimal drainage   Assessment/Plan: 1. 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 Physiatrist is providing close team supervision and 24 hour management of active medical problems listed below. Physiatrist and rehab team continue to assess barriers to discharge/monitor patient progress toward functional and medical goals. FIM: FIM - Bathing Bathing: 1: Two helpers  FIM - Upper Body Dressing/Undressing Upper body dressing/undressing: 0: Wears gown/pajamas-no public clothing FIM - Lower Body Dressing/Undressing Lower body dressing/undressing: 0: Wears Oceanographer  FIM - Toileting Toileting: 0: No continent bowel/bladder events this shift  FIM - Archivist Transfers: 0-Activity did not occur  FIM - Games developer Transfer: 1: Two helpers  FIM - Locomotion: Wheelchair Locomotion: Wheelchair: 0: Activity did not occur FIM - Locomotion: Ambulation Ambulation/Gait Assistance: 2: Max assist  Comprehension Comprehension Mode: Auditory Comprehension: 1-Understands basic less than 25% of the time/requires cueing 75% of the time  Expression Expression Mode: Verbal;Nonverbal Expression Assistive Devices: 6-Talk trach valve Expression: 1-Expresses basis less than 25% of the time/requires cueing greater than 75% of the time.  Social Interaction Social Interaction: 1-Interacts appropriately less than 25% of the time. May be withdrawn or combative.  Problem Solving Problem Solving: 1-Solves basic less than 25% of the time - needs direction nearly all the time or does not effectively solve problems and may need a restraint for safety  Memory Memory: 1-Recognizes or recalls less than 25% of the time/requires cueing greater than 75% of the time  Medical Problem List and Plan:  1. 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 Status post  craniotomy evacuation hematoma with insertion of bone flap abdominal left upper quadrant 12/15/2012 with  revision after midline shift 12/28/2012  2. DVT Prophylaxis/Anticoagulation: SCDs. Dopplers negative 3. Numerous facial fractures. Conservative care per ENT  4. Mood: Ativan as needed, Inderal 80 mg every 8 hours.  -re-establish sleep cycle/ sleep chart.  -ritalin trial. Increase today  5. Neuropsych: This patient is not capable of making decisions on his own behalf.  6. Seizure disorder. Vimpat, Keppra, Dilantin. Weaning vimpat to off--to remain with Dilantin and Keppra  7. Dysphagia/VDRF. Gastrostomy PEG tube/trach tube 12/31/2012 per trauma services.  8. Alcohol abuse. Counseling  9. Wound care: local care to PEG, crani, and abdominal sites.  -local care, absorption for crani drainage -seems to be decreasing 10. Temp: likely central, does have leukocytosis -urine culture and cxr negative, dopplers negative. -tylenol for fever -close observation for now    LOS (Days) 3 A FACE TO FACE EVALUATION WAS PERFORMED  Albert [name],Albert [name] 01/06/2013 7:35 AM

## 2013-01-06 NOTE — Progress Notes (Signed)
Speech Language Pathology Daily Session Note  Patient Details  Name: Albert [name] MRN: [id number] Date of Birth: 02-06-1976  Today's Date: 01/06/2013 Time: 1300-1350 Time Calculation (min): 50 min  Short Term Goals: Week 1: SLP Short Term Goal 1 (Week 1): Patient will demonstrate focused attention for 30 seconds with max A verbal and tactile cues.   (New goal) SLP Short Term Goal 2 (Week 1): Patient will follow one-step commands with max A verbal and tactile cues.  SLP Short Term Goal 3 (Week 1): Patient will answer yes/no questions with mod A verbal cues.  SLP Short Term Goal 4 (Week 1): Patient will increase vocal intensity while wearing PMV with mod A verbal cues.    Skilled Therapeutic Interventions: Skilled therapy session focused on Passy Muir Speaking Valve (PMV) tolerance, cognitive goals, and assessment of PO acceptance. SLP facilitated by placing and monitoring PMV, which pt wore for approximately 25 minutes. SpO2 and HR remained stable, and pt did not appear to have increased WOB or apparent distress. Pt acheived phonation that was mildly hoarse and reduced in volume, impacting his overall intelligibility. SLP provided Max assist cues for sustained attention and following one-step commands throughout functional task, which included PO trials of ice chips, water, and applesauce. Mild intermittent anterior spillage and spitting observed as well as cough x2 (intermittent cough also observed at baseline). Recommend additional PMV and PO trials with SLP.   FIM:  Comprehension Comprehension Mode: Auditory Comprehension: 1-Understands basic less than 25% of the time/requires cueing 75% of the time Expression Expression Mode: Verbal Expression Assistive Devices: 6-Talk trach valve (speaking valve placed by SLP) Expression: 1-Expresses basis less than 25% of the time/requires cueing greater than 75% of the time. Social Interaction Social Interaction: 1-Interacts appropriately less than  25% of the time. May be withdrawn or combative. Problem Solving Problem Solving: 1-Solves basic less than 25% of the time - needs direction nearly all the time or does not effectively solve problems and may need a restraint for safety Memory Memory: 1-Recognizes or recalls less than 25% of the time/requires cueing greater than 75% of the time FIM - Eating Eating Activity: 3: Helper brings food to mouth every scoop;2: Hand over hand assist  Pain Pain Assessment Pain Assessment: No/denies pain  Therapy/Group: Individual Therapy   Maxcine Ham, M.A. CF-SLP  Maxcine Ham 01/06/2013, 3:52 PM

## 2013-01-06 NOTE — Progress Notes (Signed)
NUTRITION FOLLOW UP  DOCUMENTATION CODES  Per approved criteria   -Severe malnutrition in the context of acute injury   Intervention:   1. Recommend probiotic - discussed with PA Dan. 2. Continue Pivot 1.5 at 90 ml/hr x 20 hours. Hold for therapies. Continue free water flushes of 350 ml QID. This rate meets 100% of estimated kcal, protein, and fluid needs. 2. RD to continue to assess enteral tolerance  Nutrition Dx:   Inadequate oral intake related to inability to eat as evidenced by NPO status. Ongoing.  Goal:   Pt to meet >/= 90% of their estimated nutrition needs. Met.  Monitor:   TF tolerance, weight, labs  Assessment:   Pt admitted after fall, positive for ETOH. Has SDH and SAH, multiple facial fractures, s/p decompressive craniectomy of L SDH. Pt with questionable cardiac arrest with CPR. Pt extubated 4/14, developed seizures and required second craniectomy. Pt had trach and PEG 4/22 and extubated on 4/24 and remains on trach collar. Pt has had persistent fevers this admission due to neuro storming.   Currently receiving Pivot 1.5 at 90 ml/hr x 20 hours. Free water of 350 ml every 6 hours. Goal will provide: 2700 kcal (100% of needs) and 168 grams protein ( >100% of minimum needs), and 1366 ml H2O. Total free water (tube feeding and free water flushes) 2766 ml.  Staff is reporting pt with several loose stools with current regimen. RD paged over weekend regarding loose stools.  Height: Ht Readings from Last 1 Encounters:  01/03/13 6' (1.829 m)    Weight Status:   Wt Readings from Last 1 Encounters:  01/06/13 167 lb 1.7 oz (75.8 kg)  Wt trending down. Wife at bedside reports usual weight PTA was around 200 - 205 lb. This is a weight loss of 16.5 % x 1 month.  Pt meets criteria for severe MALNUTRITION in the context of acute injury as evidenced by 16.5% wt loss x 1 month and moderate muscle wasting.  Pt with visible moderate muscle wasting in lower  extremities.    Re-estimated needs:  Kcal: 2500 - 2800 Protein: at least 114 grams protein Fluid: 2.5 - 2.8 liters daily  Skin: multiple incisions  Diet Order: NPO   Intake/Output Summary (Last 24 hours) at 01/06/13 1018 Last data filed at 01/06/13 0351  Gross per 24 hour  Intake    440 ml  Output   1350 ml  Net   -910 ml    Last BM: 4/27 - large loose stool per staff   Labs:   Recent Labs Lab 01/01/13 0515 01/02/13 0510 01/06/13 0700  NA 140 140 141  K 3.4* 3.6 4.0  CL 107 103 103  CO2 25 29 28   BUN 10 10 21   CREATININE 0.52 0.53 0.57  CALCIUM 7.7* 8.9 10.1  GLUCOSE 91 136* 167*    CBG (last 3)   Recent Labs  01/06/13 0050 01/06/13 0407 01/06/13 0731  GLUCAP 131* 115* 164*    Scheduled Meds: . antiseptic oral rinse  15 mL Mouth Rinse QID  . chlorhexidine  15 mL Mouth Rinse BID  . free water  350 mL Per Tube Q6H  . insulin aspart  0-15 Units Subcutaneous Q4H  . lacosamide  100 mg Per Tube BID  . levETIRAcetam  1,500 mg Intravenous Q12H  . methylphenidate  10 mg Per Tube BID WC  . multivitamin  5 mL Oral Daily  . pantoprazole sodium  40 mg Per Tube Daily  . phenytoin  100 mg Per Tube TID  . propranolol  80 mg Per Tube Q8H    Continuous Infusions: . feeding supplement (PIVOT 1.5 CAL) 1,000 mL (Jan 20, 2013 1430)    Jarold Motto MS, RD, LDN Pager: [phone] After-hours pager: [phone]

## 2013-01-06 NOTE — Progress Notes (Signed)
Physical Therapy Session Note  Patient Details  Name: Albert [name] MRN: [id number] Date of Birth: 03-09-1976  Today's Date: 01/06/2013 Time: 1400-1430 Time Calculation (min): 30 min  Short Term Goals: Week 2:  PT Short Term Goal 1 (Week 2): Patient will be able to perform bed mobility with Max-Assist. PT Short Term Goal 2 (Week 2): Patient will be able to perform transfers with Max-Assist. PT Short Term Goal 3 (Week 2): Patient will be able to sit EOB x 5 minutes with Mod-Assist  Skilled Therapeutic Interventions/Progress Updates:    Patient received supine in bed with HOB elevated, very restless, patient's wife present maintaining patient's L LE on bed. Today's session focused on NMR to core/trunk musculature, transfer training, and attention to task. Patient currently requires total assist +2 for all mobility (supine<>sit, wheelchair<>mat/bed). Switched patient's wheelchair to tilt-in-space for improved postural control secondary to patient's restlessness causing him to sit with significant posterior pelvic tilt and patient almost sliding forward in wheelchair. Switched cushion to Jay-2 for comfort and pressure relief. Switched leg rests to elevated leg rests to assist with proper positioning and postural control in sitting.  Patient transferred bed>wheelchair>mat with +2 total assist. Sitting edge of mat, patient requires total assist +1 to maintain sitting upright. Engaged patient in simple command following tasks and directing attention to specific objects. Patient able to follow simple commands 50-75% of the time, i.e., look out the window, touch my hand, look straight ahead, etc. Patient demonstrates R inattention, put can turn head R and attend when verbally or visually cued to do so. Patient fluctuated between restlessness and lethargy throughout session (At times, sitting edge of mat with eyes closed for 1-2 minutes at a time). Sitting edge of mat, patient exhibits what appears to be  muscle spasms/extensor tone moments, which are an inhibiting factor to patient's static sitting balance.  Patient returned to room and transferred wheelchair>bed and sit>supine with +2 total assist. Patient requires total assist +2 to reposition in bed. Patient left supine in bed with HOB elevated >30 degrees, B mittens donned, bed alarm on, left wrist and foot restraints applied, and all needs within reach. Patient status communicated to his wife.  Therapy Documentation Precautions:  Precautions Precautions: Fall Precaution Comments: L crani with drain, JP and trach. Foley Required Braces or Orthoses: Other Brace/Splint Other Brace/Splint: R heel suspension boot Restrictions Weight Bearing Restrictions: No Pain: Pain Assessment Pain Assessment: No/denies pain Faces Pain Scale: No hurt Locomotion : Ambulation Ambulation/Gait Assistance: Not tested (comment)  See FIM for current functional status  Therapy/Group: Co-Treatment with PT  Joelle Flessner S Shelli Portilla S. Laurita Peron, PT, DPT  01/06/2013, 4:40 PM

## 2013-01-06 NOTE — Progress Notes (Signed)
Occupational Therapy Session Note  Patient Details  Name: Albert [name] MRN: [id number] Date of Birth: 12/19/1975  Today's Date: 01/06/2013 Time: 1130-1200 Time Calculation (min): 30 min  Short Term Goals: Week 1:  OT Short Term Goal 1 (Week 1): Bed Mobility, right/left with max assist X1 OT Short Term Goal 2 (Week 1): Demo focused attention during ADL for 15 seconds OT Short Term Goal 3 (Week 1): Perform 1 seated grooming activity with moderate assist OT Short Term Goal 4 (Week 1): Dress upper body with max assist X1  Skilled Therapeutic Interventions/Progress Updates:    1:1 Pt found in w/c in room with wife and pt's brother. Focus on following basic commands related to self care tasks such as dressing, forward weight shift for positioning and sit to stands (2x) in front of sink, standing balance with focus on trunk control, relaxation of extensor tone, visual tracking to midline, positioning and functional grasp on sink, stand to sit on command. Stand<>sit with total A +2 with tactile facilitation for trunk control, bilateral knee control, hip extension and standing balance at midline.  for Pt with increased relaxation with music of his liking. Transfer w/c to bed with total A +2 with focus on maintaining forward weight shift.  Therapy Documentation Precautions:  Precautions Precautions: Fall Precaution Comments: L crani with drain, JP and trach. Foley Required Braces or Orthoses: Other Brace/Splint Other Brace/Splint: R heel suspension boot Restrictions Weight Bearing Restrictions: No Pain: No indications of pain See FIM for current functional status  Therapy/Group: Individual Therapy  [name] [facility] 01/06/2013, 12:34 PM

## 2013-01-06 NOTE — Progress Notes (Signed)
Occupational Therapy Session Note  Patient Details  Name: Albert [name] MRN: [id number] Date of Birth: Sep 18, 1975  Today's Date: 01/06/2013 Time: 0730-0830 Time Calculation (min): 60 min  Short Term Goals: Week 1:  OT Short Term Goal 1 (Week 1): Bed Mobility, right/left with max assist X1 OT Short Term Goal 2 (Week 1): Demo focused attention during ADL for 15 seconds OT Short Term Goal 3 (Week 1): Perform 1 seated grooming activity with moderate assist OT Short Term Goal 4 (Week 1): Dress upper body with max assist X1  Skilled Therapeutic Interventions/Progress Updates:    1:1 Focus on visual tracking to midline, making eye contact with therapist left to midline with extra time, focused attention to calling name, allowing touch to allow for care after incontinent episode of bowel and bladder in bed, sitting balance at EOB (at first with min A and then began to push posteriorly requiring total A) allowing a forward weight shift for a scoot transfer bed to w/c with +2. Introduction of grooming tools to reach for - 0/3 trials with extra time. Unable to demonstrate correct use. Pt with spontaneous use of right UE with movement. Pt becoming restless in w/c - pushed around unit and pt able to relax and presented with decreased restlessness. Pt with making faces when orientation information presented   Therapy Documentation Precautions:  Precautions Precautions: Fall Precaution Comments: L crani with drain, PEG and trach. Foley Required Braces or Orthoses: Other Brace/Splint Other Brace/Splint: R heel suspension boot Restrictions Weight Bearing Restrictions: No Pain:  no signs and symptoms of pain  See FIM for current functional status  Therapy/Group: Individual Therapy  [name] [facility] 01/06/2013, 9:15 AM

## 2013-01-06 NOTE — Progress Notes (Signed)
Bilateral mittens applied and left wrist and left leg soft restraints applied at 1230 . Patient restless in bed and easily distracted . Abdominal binder on . Continue with plan of care .                 Cleotilde Neer

## 2013-01-06 NOTE — Plan of Care (Signed)
Problem: RH BLADDER ELIMINATION Goal: RH STG MANAGE BLADDER WITH ASSISTANCE STG Manage Bladder With Mod Assistance  Outcome: Not Progressing Max assist

## 2013-01-06 NOTE — Progress Notes (Signed)
Patient information reviewed and entered into eRehab system by Lukis Bunt, RN, CRRN, PPS Coordinator.  Information including medical coding and functional independence measure will be reviewed and updated through discharge.    

## 2013-01-07 ENCOUNTER — Inpatient Hospital Stay (HOSPITAL_COMMUNITY): Payer: BC Managed Care – PPO | Admitting: Physical Therapy

## 2013-01-07 ENCOUNTER — Inpatient Hospital Stay (HOSPITAL_COMMUNITY): Payer: BC Managed Care – PPO | Admitting: Speech Pathology

## 2013-01-07 ENCOUNTER — Inpatient Hospital Stay (HOSPITAL_COMMUNITY): Payer: BC Managed Care – PPO | Admitting: Occupational Therapy

## 2013-01-07 DIAGNOSIS — S069X9A Unspecified intracranial injury with loss of consciousness of unspecified duration, initial encounter: Secondary | ICD-10-CM

## 2013-01-07 LAB — GLUCOSE, CAPILLARY
Glucose-Capillary: 105 mg/dL — ABNORMAL HIGH (ref 70–99)
Glucose-Capillary: 120 mg/dL — ABNORMAL HIGH (ref 70–99)
Glucose-Capillary: 126 mg/dL — ABNORMAL HIGH (ref 70–99)
Glucose-Capillary: 133 mg/dL — ABNORMAL HIGH (ref 70–99)
Glucose-Capillary: 147 mg/dL — ABNORMAL HIGH (ref 70–99)
Glucose-Capillary: 149 mg/dL — ABNORMAL HIGH (ref 70–99)

## 2013-01-07 MED ORDER — LEVETIRACETAM 100 MG/ML PO SOLN
1500.0000 mg | Freq: Two times a day (BID) | ORAL | Status: DC
  Administered 2013-01-07 – 2013-01-27 (×40): 1500 mg
  Filled 2013-01-07 (×43): qty 15

## 2013-01-07 MED ORDER — LEVETIRACETAM 100 MG/ML PO SOLN: 1500.0000 mg | Freq: Two times a day (BID) | ORAL | Status: DC

## 2013-01-07 NOTE — Progress Notes (Signed)
Patient ID: Albert [name], male   DOB: 1975/10/19, 37 y.o.   MRN: [id number]    Saw him yesterday and today. Flap healing wee, sunk in,pulsatil. Mouthing some words. i will remove staples next week.i have not seen his family since last saturday

## 2013-01-07 NOTE — Progress Notes (Signed)
Per MD order to change trach from 8 Shiley to 6CFS. Sxn trach before-sm yellow. Cuff deflated. 8 Shiley removed and RT placed 6CFS shiley trach. Good color change on CO2 detector. BBS Clr dim. Pt tol well. RR 16, Sats 98, HR 88. Pt tol well

## 2013-01-07 NOTE — Progress Notes (Signed)
[facility] Individual Statement of Services  Patient Name:  Albert [name]  Date:  01/07/2013  Welcome to the [facility].  Our goal is to provide you with an individualized program based on your diagnosis and situation, designed to meet your specific needs.  With this comprehensive rehabilitation program, you will be expected to participate in at least 3 hours of rehabilitation therapies Monday-Friday, with modified therapy programming on the weekends.  Your rehabilitation program will include the following services:  Physical Therapy (PT), Occupational Therapy (OT), Speech Therapy (ST), 24 hour per day rehabilitation nursing, Therapeutic Recreaction (TR), Neuropsychology, Case Management (Social Worker), Rehabilitation Medicine, Nutrition Services and Pharmacy Services  Weekly team conferences will be held on Tuesdays to discuss your progress.  Your Social Worker will talk with you frequently to get your input and to update you on team discussions.  Team conferences with you and your family in attendance may also be held.  Expected length of stay: 4-6 weeks  Overall anticipated outcome: supervision  Depending on your progress and recovery, your program may change. Your Social Worker will coordinate services and will keep you informed of any changes. Your Social Worker's name and contact numbers are listed  below.  The following services may also be recommended but are not provided by the [facility]:   Driving Evaluations  Home Health Rehabiltiation Services  Outpatient Rehabilitatation [facility]  Vocational Rehabilitation   Arrangements will be made to provide these services after discharge if needed.  Arrangements include referral to agencies that provide these services.  Your insurance has been verified to be:  [employer] PPO Your primary doctor is:  none  Pertinent information will be shared with your doctor and your insurance  company.  Social Worker:  [place], [place] [phone] or ([phone]  Information discussed with and copy given to patient by: Amada Jupiter, 01/07/2013, 1:13 PM

## 2013-01-07 NOTE — Progress Notes (Addendum)
Subjective/Complaints: Seemed to sleep well. Slow to awaken. No respiratory issues.  No ROS due to cognition   Objective: Vital Signs: Blood pressure 124/74, pulse 82, temperature 98.7 F (37.1 C), temperature source Oral, resp. rate 20, height 6' (1.829 m), weight 80.8 kg (178 lb 2.1 oz), SpO2 98.00%. No results found.  Recent Labs  01/06/13 0700  WBC 12.8*  HGB 11.5*  HCT 34.0*  PLT 530*    Recent Labs  01/06/13 0700  NA 141  K 4.0  CL 103  GLUCOSE 167*  BUN 21  CREATININE 0.57  CALCIUM 10.1   CBG (last 3)   Recent Labs  01/07/13 0037 01/07/13 0513 01/07/13 0725  GLUCAP 120* 149* 147*    Wt Readings from Last 3 Encounters:  01/07/13 80.8 kg (178 lb 2.1 oz)  01/03/13 83.3 kg (183 lb 10.3 oz)  01/03/13 83.3 kg (183 lb 10.3 oz)    Physical Exam:  Eyes:  Pupils reactive to light  Neck: Neck supple. No thyromegaly present.  Cardiovascular: Normal rate and regular rhythm.  Pulmonary/Chest: Effort normal and breath sounds normal. No respiratory distress.  Abdominal: Soft. Bowel sounds are normal. He exhibits no distension.  Neurological: GCS eye subscore is 4. GCS verbal subscore is 1. GCS motor subscore is 4--total score of 9.  Slow to arouse. Makes eye contact. withdrawal to pinch in the left upper and left lower extremity only with facial grimaces noted. Right upper flexor synergy and right lower extremity with extensor posturing. RUE tone is 2-3 Could not perform manual muscle testing secondary to inability to cooperate  Skin:  Craniotomy site with decreased drainage   Peg and donor sites on abdomen are intact with only minimal drainage   Assessment/Plan: 1. Functional deficits secondary to severe left SDH, SAH s/p craniectomy, RLAS III to IV which require 3+ hours per day of interdisciplinary therapy in a comprehensive inpatient rehab setting. Physiatrist is providing close team supervision and 24 hour management of active medical problems listed  below. Physiatrist and rehab team continue to assess barriers to discharge/monitor patient progress toward functional and medical goals. FIM: FIM - Bathing Bathing: 1: Two helpers  FIM - Upper Body Dressing/Undressing Upper body dressing/undressing: 0: Wears gown/pajamas-no public clothing FIM - Lower Body Dressing/Undressing Lower body dressing/undressing: 0: Wears Oceanographer  FIM - Toileting Toileting: 0: No continent bowel/bladder events this shift  FIM - Archivist Transfers: 0-Activity did not occur  FIM - Games developer Transfer: 1: Two helpers  FIM - Locomotion: Wheelchair Locomotion: Wheelchair: 1: Total Assistance/staff pushes wheelchair (Pt<25%) FIM - Locomotion: Ambulation Ambulation/Gait Assistance: Not tested (comment) Locomotion: Ambulation: 0: Activity did not occur  Comprehension Comprehension Mode: Auditory Comprehension: 1-Understands basic less than 25% of the time/requires cueing 75% of the time  Expression Expression Mode: Nonverbal Expression Assistive Devices: 6-Other (Comment) Expression: 1-Expresses basis less than 25% of the time/requires cueing greater than 75% of the time.  Social Interaction Social Interaction: 1-Interacts appropriately less than 25% of the time. May be withdrawn or combative.  Problem Solving Problem Solving: 1-Solves basic less than 25% of the time - needs direction nearly all the time or does not effectively solve problems and may need a restraint for safety  Memory Memory: 1-Recognizes or recalls less than 25% of the time/requires cueing greater than 75% of the time  Medical Problem List and Plan:  1. 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 Status post craniotomy evacuation hematoma with insertion of bone flap  abdominal left upper quadrant 12/15/2012 with revision after midline shift 12/28/2012  2. DVT Prophylaxis/Anticoagulation:  SCDs. Dopplers negative 3. Numerous facial fractures. Conservative care per ENT  4. Mood: Ativan as needed, Inderal 80 mg every 8 hours.  -re-establish sleep cycle/ sleep chart.  -ritalin trial. Increased -mitten restraints 5. Neuropsych: This patient is not capable of making decisions on his own behalf.  6. Seizure disorder. Vimpat, Keppra, Dilantin. Weaning vimpat to off--to remain with Dilantin and Keppra  7. Dysphagia/VDRF. Gastrostomy PEG tube/trach tube 12/31/2012 per trauma services.  -downsize to #6 trach, cuffless---contact trauma (sutured)  8. Alcohol abuse. Counseling  9. Wound care: local care to PEG, crani, and abdominal sites.  -local care, absorption for crani drainage -seems to be decreasing 10. Temp: afebrile, stable leukocytosis -urine culture and cxr negative, dopplers negative. -tylenol for fever -close observation for now    LOS (Days) 4 A FACE TO FACE EVALUATION WAS PERFORMED  Marlie Kuennen T 01/07/2013 7:51 AM

## 2013-01-07 NOTE — Progress Notes (Signed)
Occupational Therapy Session Note  Patient Details  Name: Albert [name] MRN: [id number] Date of Birth: 05/05/1976  Today's Date: 01/07/2013 Time: 1300-1400 Time Calculation (min): 60 min  Short Term Goals: Week 1:  OT Short Term Goal 1 (Week 1): Bed Mobility, right/left with max assist X1 OT Short Term Goal 2 (Week 1): Demo focused attention during ADL for 15 seconds OT Short Term Goal 3 (Week 1): Perform 1 seated grooming activity with moderate assist OT Short Term Goal 4 (Week 1): Dress upper body with max assist X1  Skilled Therapeutic Interventions/Progress Updates:    1:1 Focus on bed mobility to come to EOB demonstrating increased intimation and functional use of left hand to push himself into sitting; transfer bed to w/c with increased visual attention and initiation of movement towards the chair (required +2 to get to chair), transfer to mat to continue to focus on sitting balance statically and dynamically with functional reach to the left and at midline. Pt able to sit EOM with min to mod A with decreased extensor tone. With functional reach pt displaying poor visual perceptual awareness and decreased awareness of positioning of hand to grasp object. Returned to bed at end of session for trach to be downsized.   Therapy Documentation Precautions:  Precautions Precautions: Fall Precaution Comments: L crani with drain, JP and trach. Foley Required Braces or Orthoses: Other Brace/Splint Other Brace/Splint: R heel suspension boot Restrictions Weight Bearing Restrictions: No Pain:  pt with increased awareness of right leg filling "tight"  See FIM for current functional status  Therapy/Group: Individual Therapy  [name] [facility] 01/07/2013, 3:32 PM

## 2013-01-07 NOTE — Progress Notes (Signed)
Social Work  Social Work Assessment and Plan  Patient Details  Name: Albert [name] MRN: [id number] Date of Birth: Feb 01, 1976  Today's Date: 01/07/2013  Problem List:  Patient Active Problem List   Diagnosis Date Noted  . Closed fracture of facial bones 01/03/2013  . TBI (traumatic brain injury) 01/03/2013  . Pneumonia 01/03/2013  . Acute blood loss anemia 12/30/2012  . Subdural hemorrhage following injury 12/15/2012  . Respiratory failure following trauma and surgery 12/15/2012  . Cardiac arrest 12/15/2012  . Blunt trauma of face 12/15/2012  . Blunt head trauma 12/15/2012  . Traumatic subarachnoid bleed with LOC of 30 minutes or less 12/15/2012  . Elevated ETOH level 12/15/2012   Past Medical History:  Past Medical History  Diagnosis Date  . Headache    Past Surgical History:  Past Surgical History  Procedure Laterality Date  . Craniotomy Left 12/15/2012    Procedure: CRANIECTOMY HEMATOMA EVACUATION SUBDURAL WITH PLACEMENT OF BONE FLAP IN ABDOMINAL WALL;  Surgeon: Karn Cassis, MD;  Location: [facility]UR[facility] ORS;  Service: Neurosurgery;  Laterality: Left;  . Craniotomy Left 12/28/2012    Procedure: CRANIOTOMY HEMATOMA EVACUATION SUBDURAL;  Surgeon: Maeola Harman, MD;  Location: [facility]UR[facility] ORS;  Service: Neurosurgery;  Laterality: Left;  . Peg placement N/A 12/31/2012    Procedure: PERCUTANEOUS ENDOSCOPIC GASTROSTOMY (PEG) PLACEMENT;  Surgeon: Liz Malady, MD;  Location: [facility] ENDOSCOPY;  Service: General;  Laterality: N/A;  bedside  peg  . Percutaneous tracheostomy N/A 12/31/2012    Procedure: PERCUTANEOUS TRACHEOSTOMY (BEDSIDE);  Surgeon: Liz Malady, MD;  Location: [facility] OR;  Service: General;  Laterality: N/A;   Social History:  reports that he quit smoking about 10 years ago. His smoking use included Cigarettes. He smoked 0.00 packs per day. He does not have any smokeless tobacco history on file. He reports that he drinks about 3.6 ounces of alcohol per week. His drug history is  not on file.  Family / Support Systems Marital Status: Married How Long?: 9 yrs ("together" x 14 yrs) Patient Roles: Spouse;Parent (Has been with wife for 14 years.) Spouse/Significant Other: wife, Albert [name] @ (C) [phone] Children: they have a 67 y.o. daughter and wife is [redacted] weeks pregnant currently Other Supports: pt's parents:  Albert [name] @ (C) [phone] and Albert [name] @ [phone]  [phone] (living in [place]);  Pt also has two local brother and a sister Albert [name]) Anticipated Caregiver: wife, patient's parents, maybe patient's sister Ability/Limitations of Caregiver: Wife works with [employer], can work from home, but still in process of considering how to do this.  Wife in early stages of pregnancy and also caring for their daughter.  Parents are both retired, however, living in [place] Caregiver Availability: 24/7 Family Dynamics: All family is reportedly very supportive.  Wife notes pt is close with his parents and siblings.  Wife notes very stable marriage "... we're in it for the long haul..."  Social History Preferred language: English Religion:  Cultural Background: NA Education: Psychologist, occupational Read: Yes Write: Yes Employment Status: Employed Name of Employer: [employer] of Employment:  (2 yrs) Return to Work Plans: TBD Fish farm manager Issues: None Guardian/Conservator: None   Abuse/Neglect Physical Abuse: Denies Verbal Abuse: Denies Sexual Abuse: Denies Exploitation of patient/patient's resources: Denies Self-Neglect: Denies  Emotional Status Pt's affect, behavior adn adjustment status: Pt in very early stages of TBI recovery with signficant cognitive impairments - assessment of emotional adjustment to be ongoing.   Recent Psychosocial Issues: None Pyschiatric  History: None Substance Abuse History: None  Patient / Family Perceptions, Expectations & Goals Pt/Family understanding of illness & functional limitations: Wife very eager for  information and education - open to direction from staff and guildance throughout his stay.  Parents with good, basic understanding of his injury, however, may need a little more guidance on interactiona with pt.   Premorbid pt/family roles/activities: This was a very active, young family with two full-time employed parents, a young child and expecting a second.  Wife describes pt as "...extremely athletic...did everything... very goal oriented in life and work..." Anticipated changes in roles/activities/participation: Wife very aware that she will become a caregiver to pt and will be challenged to splits several roles at home and at work.  Parents aware they will need to provide assistance. Pt/family expectations/goals: "I am hoping for a miracle...we've gotten them so far.Marland KitchenMarland KitchenI can deal with memory problems but I would like him to be better physically..."  [employer]: None Premorbid Home Care/DME Agencies: None Transportation available at discharge: yes Resource referrals recommended: Neuropsychology;Support group (specify);Advocacy groups  Discharge Planning Support Systems: Spouse/significant other;Parent;Other relatives;Friends/neighbors Type of Residence: Private residence Insurance Resources: Media planner (specify) ([employer] of [place]) Financial Resources: Employment Financial Screen Referred: No Living Expenses: Database administrator Management: Patient Do you have any problems obtaining your medications?: No Home Management: shared between pt and wife Patient/Family Preliminary Plans: Plans upon admit are for pt to d/c home with wife as primary caregiver.  She will try and set up her work so majority can be completed from home.  parents to provide intermittent support.   Social Work Anticipated Follow Up Needs: HH/OP;Support Group DC Planning Additional Notes/Comments: Wife and I had very open discussion of other options that may be available to her including SNF and,  possibly, Learning Services (will check if could be possible for short-term via insurance.) Expected length of stay: 4-6 weeks.  Clinical Impression Very unfortunate young family here after pt suffered a TBI and remains significantly impaired.  Good support system which includes a wife (who is also in early stages of pregnancy), their 5 y.o. Daughter and his parents/ siblings.  All appear open to ongoing education and wife presents with quite a bit of mental flexibility as she considers potential scenarios regarding managing at home with patient.  Will follow for support and d/c planning.  Albert [name] 01/07/2013, 5:32 PM

## 2013-01-07 NOTE — Progress Notes (Signed)
Physical Therapy Note  Patient Details  Name: Albert [name] MRN: [id number] Date of Birth: 02-22-1976 Today's Date: 01/07/2013  Time 1: 900-958 58 minutes  1:1 No signs/symptoms of pain.  Treatment session focused on seated balance and participation in therapeutic activities, R side awareness and focused attention.  Transfers bed <> w/c with +2 assist, pt with extensor tone in trunk and LEs, unable to self correct.  Seated balance activities with grasp and release with B UEs with max tactile and gesturing cues, pt able to initiate each step with increased time. Noted increased tone and incoordination B UEs R > L.  Standing tolerance with +2 total A with pt pushing with L LE, no wt bearing on R.  Pt able to gait 15' with +2 total A advancing L LE without assist, scissoring gait, R knee flexion and PF throughout gait sequence.  Time 2: 1130-1150 20 minutes  1:1 No signs/symptoms of pain.  Pt rec'd from SLP, increasing restlessness and agitation.  Pt states he would like to "get a nap".  Pt assisted to bed with +2 assist for safety.  Standing and side stepping edge of bed with +2 total A.  Pt requires +2 assist for bed mobility due to impaired attention and decreased R side awareness.     Albert [name] 01/07/2013, 9:57 AM

## 2013-01-07 NOTE — Progress Notes (Signed)
Occupational Therapy Session Note  Patient Details  Name: Albert [name] MRN: [id number] Date of Birth: 1975-09-14  Today's Date: 01/07/2013 Time: 0730-0830 Time Calculation (min): 60 min  Short Term Goals: Week 1:  OT Short Term Goal 1 (Week 1): Bed Mobility, right/left with max assist X1 OT Short Term Goal 2 (Week 1): Demo focused attention during ADL for 15 seconds OT Short Term Goal 3 (Week 1): Perform 1 seated grooming activity with moderate assist OT Short Term Goal 4 (Week 1): Dress upper body with max assist X1  Skilled Therapeutic Interventions/Progress Updates:    1:1 self care retraining at bed and w/c level. Pt asleep when arrived and RN reported pt with poor night (very restless until 3am). Pt slow to arouse and become alert. Focus on rolling in bed to don clean brief and pants with total A +2. Came to EOB with total A with increased pushing into extensor patterns and with left UE; able to relax with soft therapist voice and with prolonged sitting their. Total A +2 for squat pivot to w/c (tilt in space) with increased ability to allow therapist to bring him forward. Once in w/c presented pt with his shirt and grooming tools and pt with automatic movements with left hand to use them (donning deodorant), washing face and brushing teeth and putting left UE in shirt hole; with A to initiate task pt able to follow through for brief moments. Pt able to answer basic yes/ no questions regarding his wants voicing without PSMV on. Pt able to tell me his daughter's name and how old she is. Looked at family pic book with focus on focused attention, visual tracking and scanning to midline and to the right. Pt with increased behaviors consistent with [place] during this session. Pt continues to not be aware he is in the hospital.  Therapy Documentation Precautions:  Precautions Precautions: Fall Precaution Comments: L crani with drain, JP and trach. Foley Required Braces or Orthoses: Other  Brace/Splint Other Brace/Splint: R heel suspension boot Restrictions Weight Bearing Restrictions: No Pain:  pt shook his head no to pain throughout session  See FIM for current functional status  Therapy/Group: Individual Therapy  [name] [facility] 01/07/2013, 9:09 AM

## 2013-01-07 NOTE — Progress Notes (Signed)
Speech Language Pathology Daily Session Note  Patient Details  Name: Albert [name] MRN: [id number] Date of Birth: 03-01-1976  Today's Date: 01/07/2013 Time: 1030-1130 Time Calculation (min): 60 min  Short Term Goals: Week 1: SLP Short Term Goal 1 (Week 1): Patient will demonstrate focused attention for 30 seconds with max A verbal and tactile cues.   (New goal) SLP Short Term Goal 2 (Week 1): Patient will follow one-step commands with max A verbal and tactile cues.  SLP Short Term Goal 3 (Week 1): Patient will answer yes/no questions with mod A verbal cues.  SLP Short Term Goal 4 (Week 1): Patient will increase vocal intensity while wearing PMV with mod A verbal cues.    Skilled Therapeutic Interventions: Skilled therapy session focused on [place] Speaking Valve (PMV) tolerance, cognitive goals, and assessment of PO acceptance. SLP facilitated session by placing and monitoring PMV, which patient wore for approximately 55 minutes with SpO2 92-98% and HR remained stable.  Patient tolerated well and SLP recommends upgrade to PMSV with therapies/full staff supervision.  Patient initiated intermittent phonation that was hoarse with low vocal intensity.  Patient requested "I want to ride" "Let's go." SLP reinforced requests to reduce restlessness.  SLP provided max encouragement for p.o. intake; patient was accepting of water via cup with delayed cough 2/6 sips.  Recommend continued p.o. trials with SLP.   Pain Pain Assessment Pain Assessment: No/denies pain  Therapy/Group: Individual Therapy  Charlane Ferretti., CCC-SLP [phone]  Albert [name] 01/07/2013, 5:43 PM

## 2013-01-08 ENCOUNTER — Inpatient Hospital Stay (HOSPITAL_COMMUNITY): Payer: BC Managed Care – PPO | Admitting: Occupational Therapy

## 2013-01-08 ENCOUNTER — Inpatient Hospital Stay (HOSPITAL_COMMUNITY): Payer: BC Managed Care – PPO | Admitting: Physical Therapy

## 2013-01-08 ENCOUNTER — Inpatient Hospital Stay (HOSPITAL_COMMUNITY): Payer: BC Managed Care – PPO

## 2013-01-08 DIAGNOSIS — S069X9A Unspecified intracranial injury with loss of consciousness of unspecified duration, initial encounter: Secondary | ICD-10-CM

## 2013-01-08 LAB — GLUCOSE, CAPILLARY
Glucose-Capillary: 105 mg/dL — ABNORMAL HIGH (ref 70–99)
Glucose-Capillary: 137 mg/dL — ABNORMAL HIGH (ref 70–99)
Glucose-Capillary: 140 mg/dL — ABNORMAL HIGH (ref 70–99)
Glucose-Capillary: 150 mg/dL — ABNORMAL HIGH (ref 70–99)
Glucose-Capillary: 99 mg/dL (ref 70–99)

## 2013-01-08 MED ORDER — ACETAMINOPHEN 160 MG/5ML PO SOLN
650.0000 mg | ORAL | Status: DC | PRN
Start: 2013-01-08 — End: 2013-01-27
  Administered 2013-01-08 – 2013-01-25 (×11): 650 mg
  Filled 2013-01-08 (×12): qty 20.3

## 2013-01-08 MED ORDER — PRO-STAT SUGAR FREE PO LIQD
30.0000 mL | Freq: Four times a day (QID) | ORAL | Status: DC
  Administered 2013-01-08 – 2013-01-09 (×3): 30 mL
  Filled 2013-01-08 (×8): qty 30

## 2013-01-08 MED ORDER — BACLOFEN 1 MG/ML ORAL SUSPENSION
10.0000 mg | Freq: Two times a day (BID) | ORAL | Status: DC
  Filled 2013-01-08 (×3): qty 1

## 2013-01-08 MED ORDER — VITAL 1.5 CAL PO LIQD
1000.0000 mL | ORAL | Status: DC
  Administered 2013-01-08 – 2013-01-09 (×2): 1000 mL
  Filled 2013-01-08 (×7): qty 1000

## 2013-01-08 MED ORDER — ACETAMINOPHEN 325 MG PO TABS: 650.0000 mg | ORAL_TABLET | Freq: Four times a day (QID) | ORAL | Status: DC | PRN

## 2013-01-08 MED ORDER — BACLOFEN 10 MG PO TABS
10.0000 mg | ORAL_TABLET | Freq: Two times a day (BID) | ORAL | Status: DC
  Administered 2013-01-08 – 2013-01-27 (×39): 10 mg
  Filled 2013-01-08 (×45): qty 1

## 2013-01-08 NOTE — Progress Notes (Signed)
Pts trach has been capped for tonight per order. Pt trach suctioned and cleaned prior to cap being placed. Pt hooked up to continuous pulse ox HR: 76, Sats 96% on RA. Pt is stable at this time and resting comfortably. RT will continue to monitor.

## 2013-01-08 NOTE — Progress Notes (Signed)
Physical Therapy Note  Patient Details  Name: Albert [name] MRN: [id number] Date of Birth: Aug 26, 1976 Today's Date: 01/08/2013  Time 1: [phone] 60 minutes (co-tx with PT)  1:1 No signs/symptoms of pain.  Gait training with +2 assist, 3 musketeers style with pt demonstrating flexor tone in R LE, pushing with L to R side, adductor tone and R UE adduction and flexion tone.  Standing frame to encourage wt bearing B LEs and foot flat on R.  Pt requires total A to obtain position but able to stand 2 x 10 minutes in standing frame with wt bearing through R LE, L LE with increased restlessness in standing, pt stands in hip ER on L, seems unable to get L LE comfortable.  Standing attention tasks with focus on naming shapes.  Pt unable to identify shapes on R side but able to identify shapes in L field of vision with max cuing.  Attempt reaching tasks with L UE, pt limited by visiospatial deficits.  Time 2: 1300-1400 60 minutes co-tx with OT  1:1 No signs/symptoms of pain.  Treatment session focused on seated balance and focused attention tasks.  Pt requires total A for transfers to various surfaces due to extensor tone in trunks and LEs with movement. Pt requires mod-total A for seated balance edge of mat due to trunk extension tone.  Pt intermittently able to follow 1 step commands to participate in therapeutic reaching task.  Total A for attention.  Pt with increased restlessness this session, unable to attend to tasks > 5-10 seconds.  Pt requires increased assist with mobility and balance due to poor attention throughout session.  Pt continues with much verbalization, language of confusion.   Taijon Vink 01/08/2013, 10:42 AM

## 2013-01-08 NOTE — Progress Notes (Signed)
Occupational Therapy Session Note  Patient Details  Name: Albert [name] MRN: [id number] Date of Birth: Mar 28, 1976  Today's Date: 01/08/2013 Time: 7829-5621 Time Calculation (min): 29 min  Skilled Therapeutic Interventions/Progress Updates:    Worked on initiation of sit to stand at the sink during session.  Pt currently needing total assist +2 (pt 30%) to perform standing.  With max demonstrational cueing to shift his trunk forward and with verbal cueing he could initiate sit to stand.  In standing demonstrates increased pushing to the right with decreased weightshift to the left.  Also with decreased hip extension as well in standing.  Pt with increased fear in standing secondary to limited perceptual awareness of where his body is in space.  Attempts to reach for the wall or sink with the LUE secondary to fear of falling.  Able to stand x2 with music playing as well to help decrease anxiety.  Stood for approximately 2 mins each interval.  Pt transferred back to bed squat pivot to the right with total assist (pt 30%).  He is able to initiate sit to stand when given verbal and physical cueing and increased time.    Therapy Documentation Precautions:  Precautions Precautions: Fall Precaution Comments: L crani, skull cap removed  and in abdomen Required Braces or Orthoses: Other Brace/Splint Other Brace/Splint: R heel suspension boot Restrictions Weight Bearing Restrictions: No  Pain: PAINAD (Pain Assessment in Advanced Dementia) Breathing: normal  See FIM for current functional status  Therapy/Group: Individual Therapy  Sim Choquette OTR/L 01/08/2013, 12:18 PM

## 2013-01-08 NOTE — Patient Care Conference (Signed)
Inpatient RehabilitationTeam Conference and Plan of Care Update Date: 01/07/2013   Time: 3:00 PM    Patient Name: Albert [name]      Medical Record Number: [id number]  Date of Birth: March 30, 1976 Sex: Male         Room/Bed: 4002/4002-01 Payor Info: Payor: BLUE CROSS BLUE [employer]IELD  Plan: [employer] [place] PPO  Product Type: *No Product type*     Admitting Diagnosis: rt femur fx  Admit Date/Time:  01/03/2013  3:23 PM Admission Comments: No comment available   Primary Diagnosis:  TBI (traumatic brain injury) Principal Problem: TBI (traumatic brain injury)  Patient Active Problem List   Diagnosis Date Noted  . Closed fracture of facial bones 01/03/2013  . TBI (traumatic brain injury) 01/03/2013  . Pneumonia 01/03/2013  . Acute blood loss anemia 12/30/2012  . Subdural hemorrhage following injury 12/15/2012  . Respiratory failure following trauma and surgery 12/15/2012  . Cardiac arrest 12/15/2012  . Blunt trauma of face 12/15/2012  . Blunt head trauma 12/15/2012  . Traumatic subarachnoid bleed with LOC of 30 minutes or less 12/15/2012  . Elevated ETOH level 12/15/2012    Expected Discharge Date: Expected Discharge Date:  (4-6 weeks)  Team Members Present: Physician leading conference: Dr. Faith Rogue Social Worker Present: Amada Jupiter, LCSW Nurse Present: Carmie End, RN PT Present: Reggy Eye, PT OT Present: Ardis Rowan, Corky Crafts, OT;Jennifer Katrinka Blazing, OT SLP Present: Feliberto Gottron, SLP Other (Discipline and Name): Tora Duck, PPS Coordinator     Current Status/Progress Goal Weekly Team Focus  Medical   severe TBI. advancing to RLAS V. skull fx craniectomy  normalize sleep, maximize cognition  family ed. safety awareness, see above. tone mgt.    Bowel/Bladder   incontinent bowel and bladder aware of incontinence due to increase restlessness wears condom cath lbm 4-28   bm every other day   continue with plan of care keep patient clean snd dry    Swallow/Nutrition/  Hydration   NPO  least restrictive p.o. intake  trials   ADL's   total A +2 Rancho Level IV-V  min A overall  attention, awareness, purposeful movements of bilateral UEs, visual tracking, fam ed on BI recovery, breaking up extensor tone, sitting balance   Mobility   total A, +2  min A  attention, awareness, balance   Communication   PMSV with full staff supervision   mod assist  increase voicing attempts   Safety/Cognition/ Behavioral Observations  siderails x 4 mittens left hand soft restraints to left wrist and left leg bed alarm   mod assist   monitor safety    Pain   nonverbal  repostion in chair often  tylenol liquid prn   decrease restless and grimacing   monitor    Skin   bilateral abdominal incisons with staples peg site clean and intact left scalp staples intact decrease drainage trach site with sutures skin W.N.L.  no new breakdown   monitor skin     Rehab Goals Patient on target to meet rehab goals: Yes *See Care Plan and progress notes for long and short-term goals.  Barriers to Discharge: profound cognitive deficits, spastic right hemiparesis.     Possible Resolutions to Barriers:  family ed, NMR, safety ed, CPT    Discharge Planning/Teaching Needs:  Home with wife and family to provide 24/7 supervision / assist      Team Discussion:  Approaching a Rancho 5.  Downsizing trach today.  Good, active movement in bil UE.   Attempting  to determine what visual issues are occurring.  Anticipate good recovery overall.  Revisions to Treatment Plan:  None at this point.   Continued Need for Acute Rehabilitation Level of Care: The patient requires daily medical management by a physician with specialized training in physical medicine and rehabilitation for the following conditions: Daily direction of a multidisciplinary physical rehabilitation program to ensure safe treatment while eliciting the highest outcome that is of practical value to the patient.: Yes Daily medical  management of patient stability for increased activity during participation in an intensive rehabilitation regime.: Yes Daily analysis of laboratory values and/or radiology reports with any subsequent need for medication adjustment of medical intervention for : Other;Neurological problems;Post surgical problems;Cardiac problems  Albert [name] 01/08/2013, 11:06 AM

## 2013-01-08 NOTE — Progress Notes (Signed)
Physical Therapy Session Note  Patient Details  Name: Albert [name] MRN: [id number] Date of Birth: 12/24/1975  Today's Date: 01/08/2013 Time: 1191-4782 Time Calculation (min): 27 min  Skilled Therapeutic Interventions/Progress Updates:   Patient sleeping in bed; Therapy session to focus on PROM of RUE and RLE to minimize contractures due to tone and immobility.  Also focused on use of mobilization of RUE and RLE to facilitate arousal, attention and awareness to R side and purposeful activation of R side for mobility.  Patient's arousal fluctuated during session and patient would intermittently answer yes/no questions with head nods appropriately.  Patient appeared restless and attempting to roll to L side; assisted patient with rolling to L side with mod-max A.  Following PROM to RUE patient became very restless and scooting to foot of bed; repositioned to [facility] with +2 total A and constraints re-applied for safety.   Therapy Documentation Precautions:  Precautions Precautions: Fall Precaution Comments: L crani, skull cap removed  and in abdomen Required Braces or Orthoses: Other Brace/Splint Other Brace/Splint: R heel suspension boot Restrictions Weight Bearing Restrictions: No Vital Signs: Therapy Vitals Pulse Rate: 82 Resp: 16 Patient Position, if appropriate: Lying Oxygen Therapy SpO2: 96 % O2 Device: None (Room air) Pain: Pain Assessment Pain Assessment: No/denies pain Faces Pain Scale: Hurts a little bit Pain Type: Acute pain Pain Orientation: Left Pain Onset: With Activity Pain Intervention(s): Repositioned  See FIM for current functional status  Therapy/Group: Individual Therapy  Edman Circle [facility] 01/08/2013, 4:33 PM

## 2013-01-08 NOTE — Progress Notes (Signed)
Physical Therapy Session Note  Patient Details  Name: Albert [name] MRN: [id number] Date of Birth: Jan 26, 1976  Today's Date: 01/08/2013 Time: 4098-1191 Time Calculation (min): 29 min  Short Term Goals: Week 2:  PT Short Term Goal 1 (Week 2): Patient will be able to perform bed mobility with Max-Assist. PT Short Term Goal 2 (Week 2): Patient will be able to perform transfers with Max-Assist. PT Short Term Goal 3 (Week 2): Patient will be able to sit EOB x 5 minutes with Mod-Assist  Skilled Therapeutic Interventions/Progress Updates:    Co-tx with PT.  This session focused on gait training "three musketeers" style with one therapist under each arm.  I was positioned on pt's right side where with walking he demonstrates increased right leg flexion tone.  Therapist providing manual assist at the knee to try to encourage increased knee extension and foot contact during stance phase on this side.  Alternating assist for weight shift left to right and assist needed to help progress and coordinate placement of right foot during swing (scossoring).  +2 max assist for gait and WC brought behind at end point when pt needed to sit by a third person (pt's wife).  Next, to attempt to get pt to weight bear more fully in extension on his right leg, and encourage bil upper extremity weight bearing, two therapist assisted pt in standing frame.  One person needed to support trunk and keep left leg from moving away from the support, second person positioned right leg under pt and helped position legs in standing and once stable second therapist worked on pt's attention/cognition while other therapist manually assisted in trying to keep pt WB equally on both right and left feet.  Pt stood times two with both therapist in standing frame x 10 min.  Two skilled therapist needed for positioning, hands on manual facilitation and safety to maximize his mobility during his session.    Therapy Documentation Precautions:   Precautions Precautions: Fall Precaution Comments: L crani, skull cap removed  and in abdomen Required Braces or Orthoses: Other Brace/Splint Other Brace/Splint: R heel suspension boot Restrictions Weight Bearing Restrictions: No    Vital Signs: Therapy Vitals Pulse Rate: 86 Resp: 16 Patient Position, if appropriate: Sitting Oxygen Therapy SpO2: 98 % O2 Device: None (Room air)   Locomotion : Ambulation Ambulation/Gait Assistance: 1: +2 Total assist   See FIM for current functional status  Therapy/Group: Co-Treatment  Keante Urizar B. Joesphine Schemm, PT, DPT [phone]   01/08/2013, 11:15 AM

## 2013-01-08 NOTE — Progress Notes (Signed)
Social Work Patient ID: Albert [name], male   DOB: 04-16-1976, 37 y.o.   MRN: [id number]  Met yesterday afternoon with pt's mother and this morning with wife to review team conference.  Both pleased with progress they are seeing so far and optimistic for longer term recovery.  All remain very involved and supportive.  Will continue to follow for support and d/c planning.  Briella Hobday, LCSW

## 2013-01-08 NOTE — Progress Notes (Signed)
Occupational Therapy Session Note  Patient Details  Name: Albert [name] MRN: [id number] Date of Birth: 05/03/1976  Today's Date: 01/08/2013 Time: 1330-1400 Time Calculation (min): 30 min  Short Term Goals: Week 1:  OT Short Term Goal 1 (Week 1): Bed Mobility, right/left with max assist X1 OT Short Term Goal 2 (Week 1): Demo focused attention during ADL for 15 seconds OT Short Term Goal 3 (Week 1): Perform 1 seated grooming activity with moderate assist OT Short Term Goal 4 (Week 1): Dress upper body with max assist X1  Skilled Therapeutic Interventions/Progress Updates:    1:1 [place] with Physical therapy with focus on focused to sustained attention, transfers bed<recliner<>mat, recliner to w/c,  sit to stands, initiation of purposeful movement, following one step directions, orientation information with mod cuing, functional reach and grasp with left hand with forward trunk flexion. Pt sleepy at beginning of session but became very restless at end of session. Pt presented with increased extensor tone in neck, trunk and pelvis during session and restlessness making following commands difficult.  Therapy Documentation Precautions:  Precautions Precautions: Fall Precaution Comments: L crani, skull cap removed  and in abdomen Required Braces or Orthoses: Other Brace/Splint Other Brace/Splint: R heel suspension boot Restrictions Weight Bearing Restrictions: No Pain: Pain Assessment Pain Assessment: No/denies pain PAINAD (Pain Assessment in Advanced Dementia) Breathing: normal  See FIM for current functional status  Therapy/Group: Individual Therapy , co treat   [name] [facility] 01/08/2013, 3:39 PM

## 2013-01-08 NOTE — Progress Notes (Signed)
Speech Language Pathology Daily Session Note  Patient Details  Name: Albert [name] MRN: [id number] Date of Birth: 21-Mar-1976  Today's Date: 01/08/2013 Time: 1130-1200 Time Calculation (min): 30 min  Short Term Goals: Week 1: SLP Short Term Goal 1 (Week 1): Patient will demonstrate focused attention for 30 seconds with max A verbal and tactile cues.   (New goal) SLP Short Term Goal 1 - Progress (Week 1): Progressing toward goal SLP Short Term Goal 2 (Week 1): Patient will follow one-step commands with max A verbal and tactile cues.  SLP Short Term Goal 2 - Progress (Week 1): Progressing toward goal SLP Short Term Goal 3 (Week 1): Patient will answer yes/no questions with mod A verbal cues.  SLP Short Term Goal 3 - Progress (Week 1): Progressing toward goal SLP Short Term Goal 4 (Week 1): Patient will increase vocal intensity while wearing PMV with mod A verbal cues.   SLP Short Term Goal 4 - Progress (Week 1): Progressing toward goal SLP Short Term Goal 5 - Progress (Week 1): Progressing toward goal  Skilled Therapeutic Interventions: Treatment focused on sustained attention to task and carryout of basic functional activity. Mmax verbal cueing provided for sustained attention to basic feeding task in which therapist provided diagnostic po trials (puree, thin liquids) with no overt indication of aspiration although trials limited. PMSV placed without incidence. Vital signs remained WFL. Vocal quality with continued low intensity and hoarse/breathy quality now with #6.0 cuffless shiley in place. Max verbal cueing provided for increased intensity.  Max verbal cueing for carryout of 1-step commands related to task however abilitites impacted by visual perceptual deficits and pushing on the left. Patient able to respond appropriately to basic Y/N questions with 25% accuracy today. Language of confusion vs aphasia continues; favor language of confusion however will continue differential diagnosis.     FIM:  Comprehension Comprehension Mode: Auditory Comprehension: 1-Understands basic less than 25% of the time/requires cueing 75% of the time Expression Expression Mode: Verbal Expression Assistive Devices: 6-Talk trach valve Expression: 1-Expresses basis less than 25% of the time/requires cueing greater than 75% of the time. Problem Solving Problem Solving: 1-Solves basic less than 25% of the time - needs direction nearly all the time or does not effectively solve problems and may need a restraint for safety Memory Memory: 1-Recognizes or recalls less than 25% of the time/requires cueing greater than 75% of the time  Pain Pain Assessment Pain Assessment: No/denies pain PAINAD (Pain Assessment in Advanced Dementia) Breathing: normal  Therapy/Group: Individual Therapy  Kobie Matkins [name] 01/08/2013, 1:49 PM

## 2013-01-08 NOTE — Progress Notes (Signed)
Subjective/Complaints: Reasonable night. No issues with trach. Increased participation with therapy. No ROS due to cognition   Objective: Vital Signs: Blood pressure 139/87, pulse 84, temperature 98.7 F (37.1 C), temperature source Axillary, resp. rate 18, height 6' (1.829 m), weight 82.8 kg (182 lb 8.7 oz), SpO2 99.00%. No results found.  Recent Labs  01/06/13 0700  WBC 12.8*  HGB 11.5*  HCT 34.0*  PLT 530*    Recent Labs  01/06/13 0700  NA 141  K 4.0  CL 103  GLUCOSE 167*  BUN 21  CREATININE 0.57  CALCIUM 10.1   CBG (last 3)   Recent Labs  01/08/13 0019 01/08/13 0402 01/08/13 0726  GLUCAP 137* 105* 140*    Wt Readings from Last 3 Encounters:  01/08/13 82.8 kg (182 lb 8.7 oz)  01/03/13 83.3 kg (183 lb 10.3 oz)  01/03/13 83.3 kg (183 lb 10.3 oz)    Physical Exam:  Eyes:  Pupils reactive to light  Neck: Neck supple. No thyromegaly present.  Cardiovascular: Normal rate and regular rhythm.  Pulmonary/Chest: Effort normal and breath sounds normal. No respiratory distress.  Abdominal: Soft. Bowel sounds are normal. He exhibits no distension.  Neurological: GCS eye subscore is 4. GCS verbal subscore is 1. GCS motor subscore is 4--total score of 9.  Slow to arouse. Makes eye contact. withdrawal to pinch in the left upper and left lower extremity only with facial grimaces noted. Right upper flexor synergy and right lower extremity with extensor posturing. RUE  Tone still is 2-3 Could not perform manual muscle testing secondary to inability to cooperate  Skin:  Craniotomy site with decreased drainage   Peg and donor sites on abdomen are intact with only minimal drainage   Assessment/Plan: 1. 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 Physiatrist is providing close team supervision and 24 hour management of active  medical problems listed below. Physiatrist and rehab team continue to assess barriers to discharge/monitor patient progress toward functional and medical goals. FIM: FIM - Bathing Bathing: 1: Two helpers  FIM - Upper Body Dressing/Undressing Upper body dressing/undressing: 1: Total-Patient completed less than 25% of tasks FIM - Lower Body Dressing/Undressing Lower body dressing/undressing: 1: Total-Patient completed less than 25% of tasks  FIM - Toileting Toileting: 0: No continent bowel/bladder events this shift  FIM - Archivist Transfers: 0-Activity did not occur  FIM - Games developer Transfer: 1: Two helpers  FIM - Locomotion: Wheelchair Locomotion: Wheelchair: 1: Total Assistance/staff pushes wheelchair (Pt<25%) FIM - Locomotion: Ambulation Ambulation/Gait Assistance: Not tested (comment) Locomotion: Ambulation: 1: Two helpers  Comprehension Comprehension Mode: Auditory Comprehension: 1-Understands basic less than 25% of the time/requires cueing 75% of the time  Expression Expression Mode: Nonverbal (speaks few words) Expression Assistive Devices: 6-Talk trach valve Expression: 1-Expresses basis less than 25% of the time/requires cueing greater than 75% of the time.  Social Interaction Social Interaction: 1-Interacts appropriately less than 25% of the time. May be withdrawn or combative.  Problem Solving Problem Solving: 1-Solves basic less than 25% of the time - needs direction nearly all the time or does not effectively solve problems and may need a restraint for safety  Memory Memory: 1-Recognizes or recalls less than 25% of the time/requires cueing greater than 75% of the time  Medical Problem List and Plan:  1. 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 Status  post craniotomy evacuation hematoma with insertion of bone flap abdominal left upper quadrant 12/15/2012 with revision after midline shift  12/28/2012 ---RLAS IV to V 2. DVT Prophylaxis/Anticoagulation: SCDs. Dopplers negative 3. Numerous facial fractures. Conservative care per ENT  4. Mood: Ativan as needed, Inderal 80 mg every 8 hours.  -re-establish sleep cycle/ sleep chart.  -ritalin  -mitten, wrist, ankle restraint for patient safety. Move to enclosure bed potentially once trach out 5. Neuropsych: This patient is not capable of making decisions on his own behalf.  6. Seizure disorder. Vimpat, Keppra, Dilantin. Weaning vimpat to off--to remain with Dilantin and Keppra  7. Dysphagia/VDRF. Gastrostomy PEG tube/trach tube 12/31/2012 per trauma services.  -downsize to #6 trach, cuffless---contact trauma (sutured)   -plug qhs, PMV during the day. 8. Alcohol abuse. Counseling  9. Wound care: local care to PEG, crani, and abdominal sites.  -local care, absorption for crani drainage -seems to be decreasing -helmet at some point 10. Temp: afebrile, stable leukocytosis -urine culture and cxr negative, dopplers negative. 11. Spasticity: will add low dose baclofen     LOS (Days) 5 A FACE TO FACE EVALUATION WAS PERFORMED  SWARTZ,ZACHARY T 01/08/2013 8:05 AM

## 2013-01-08 NOTE — Progress Notes (Addendum)
NUTRITION FOLLOW UP  DOCUMENTATION CODES  Per approved criteria   -Severe malnutrition in the context of acute injury   Intervention:   1. To promote better GI tolerance and absorption, will switch to elemental enteral nutrition formula. Initiate Vital 1.5 @ 35 ml/hr x 20 hours daily. (Anticipate TF to be held 4 hours daily for participation in therapy.) Increase by 10 ml every 4 hours to goal rate of 75 ml/hr x 20 hours daily. Add 30 ml Prostat via tube QID. Goal regimen will provide: 2650 kcal, 161 grams protein, 1146 ml free water. Continue free water flushes of 350 ml QID for an additional 1400 ml free water daily. 2. RD to continue to assess enteral tolerance  Nutrition Dx:   Inadequate oral intake related to inability to eat as evidenced by NPO status. Ongoing.  Goal:   Pt to meet >/= 90% of their estimated nutrition needs. Met.  Monitor:   TF tolerance, weight, labs  Assessment:   Pt admitted after fall, positive for ETOH. Has SDH and SAH, multiple facial fractures, s/p decompressive craniectomy of L SDH. Pt with questionable cardiac arrest with CPR. Pt extubated 4/14, developed seizures and required second craniectomy. Pt had trach and PEG 4/22 and extubated on 4/24 and remains on trach collar. Pt has had persistent fevers this admission due to neuro storming.   Currently receiving Pivot 1.5 at 90 ml/hr x 20 hours. Free water of 350 ml every 6 hours. Goal will provide: 2700 kcal (100% of needs) and 168 grams protein ( >100% of minimum needs), and 1366 ml H2O. Total free water (tube feeding and free water flushes) 2766 ml.  Staff is reporting continued loose stools. RD discussed changing TF formula with Dan, PA. RD to order at this time.  Height: Ht Readings from Last 1 Encounters:  01/03/13 6' (1.829 m)    Weight Status:   Wt Readings from Last 1 Encounters:  01/08/13 182 lb 8.7 oz (82.8 kg)  Wt trending up. Wife at bedside reports usual weight PTA was around 200 - 205 lb.  This is a weight loss of 16.5 % x 1 month.  Pt meets criteria for severe MALNUTRITION in the context of acute injury as evidenced by 16.5% wt loss x 1 month and moderate muscle wasting.  e-estimated needs:  Kcal: 2500 - 2800 Protein: at least 114 grams protein Fluid: 2.5 - 2.8 liters daily  Skin: multiple incisions  Diet Order: NPO   Intake/Output Summary (Last 24 hours) at 01/08/13 1213 Last data filed at 01/08/13 0410  Gross per 24 hour  Intake    530 ml  Output   1550 ml  Net  -1020 ml    Last BM: 4/30 - large loose stool per staff   Labs:   Recent Labs Lab 01/02/13 0510 01/06/13 0700  NA 140 141  K 3.6 4.0  CL 103 103  CO2 29 28  BUN 10 21  CREATININE 0.53 0.57  CALCIUM 8.9 10.1  GLUCOSE 136* 167*    CBG (last 3)   Recent Labs  01/08/13 0402 01/08/13 0726 01/08/13 1136  GLUCAP 105* 140* 150*    Scheduled Meds: . antiseptic oral rinse  15 mL Mouth Rinse QID  . baclofen  10 mg Per Tube BID  . chlorhexidine  15 mL Mouth Rinse BID  . free water  350 mL Per Tube Q6H  . insulin aspart  0-15 Units Subcutaneous Q4H  . lacosamide  100 mg Per Tube BID  .  levETIRAcetam  1,500 mg Per Tube BID  . methylphenidate  10 mg Per Tube BID WC  . multivitamin  5 mL Oral Daily  . pantoprazole sodium  40 mg Per Tube Daily  . phenytoin  100 mg Per Tube TID  . propranolol  80 mg Per Tube Q8H  . saccharomyces boulardii  250 mg Oral BID    Continuous Infusions: . feeding supplement (PIVOT 1.5 CAL) 1,000 mL (01/07/13 1402)    Jarold Motto MS, RD, LDN Pager: [phone] After-hours pager: [phone]

## 2013-01-08 NOTE — Progress Notes (Signed)
Occupational Therapy Session Note  Patient Details  Name: Albert [name] MRN: [id number] Date of Birth: [birth year]  Today's Date: 01/08/2013 Time: 0830-0930 Time Calculation (min): 60 min  Short Term Goals: Week 1:  OT Short Term Goal 1 (Week 1): Bed Mobility, right/left with max assist X1 OT Short Term Goal 2 (Week 1): Demo focused attention during ADL for 15 seconds OT Short Term Goal 3 (Week 1): Perform 1 seated grooming activity with moderate assist OT Short Term Goal 4 (Week 1): Dress upper body with max assist X1  Skilled Therapeutic Interventions/Progress Updates:    1:1 Pt awake and bed when arrive - restless and moving all around the bed. Focus on following one step directions and being allowed to be cared for with hygiene after incontinent episode of BM and to don LB clothing. Pt came to EOB with max A with increased initiation of trunk flexion and visual tracking to right side of bed. Able to sit EOB with min -mod A. Pt with initiation to assist with donning a shirt. Total A +2 to transfer into w/c with pt allowing trunk flexion for weight shift. Pt initiated briefly use of tools wash cloth and using toothbrush- assistance needed to follow through. Demonstrating Rancho Level V. Pt wore PMSV for the entire session with good voice quality.   Therapy Documentation Precautions:  Precautions Precautions: Fall Precaution Comments: L crani, skull cap removed  and in abdomen Required Braces or Orthoses: Other Brace/Splint Other Brace/Splint: R heel suspension boot Restrictions Weight Bearing Restrictions: No Pain: Pt reported "yes" to pain but unable to verbalize where- RN aware See FIM for current functional status  Therapy/Group: Individual Therapy  [name] [facility] 01/08/2013, 12:19 PM

## 2013-01-09 ENCOUNTER — Inpatient Hospital Stay (HOSPITAL_COMMUNITY): Payer: BC Managed Care – PPO | Admitting: Occupational Therapy

## 2013-01-09 ENCOUNTER — Inpatient Hospital Stay (HOSPITAL_COMMUNITY): Payer: BC Managed Care – PPO | Admitting: Physical Therapy

## 2013-01-09 ENCOUNTER — Inpatient Hospital Stay (HOSPITAL_COMMUNITY): Payer: BC Managed Care – PPO | Admitting: Speech Pathology

## 2013-01-09 ENCOUNTER — Encounter (HOSPITAL_COMMUNITY): Payer: BC Managed Care – PPO | Admitting: Occupational Therapy

## 2013-01-09 ENCOUNTER — Inpatient Hospital Stay (HOSPITAL_COMMUNITY): Payer: BC Managed Care – PPO | Admitting: *Deleted

## 2013-01-09 LAB — GLUCOSE, CAPILLARY
Glucose-Capillary: 109 mg/dL — ABNORMAL HIGH (ref 70–99)
Glucose-Capillary: 113 mg/dL — ABNORMAL HIGH (ref 70–99)
Glucose-Capillary: 116 mg/dL — ABNORMAL HIGH (ref 70–99)
Glucose-Capillary: 121 mg/dL — ABNORMAL HIGH (ref 70–99)
Glucose-Capillary: 125 mg/dL — ABNORMAL HIGH (ref 70–99)
Glucose-Capillary: 130 mg/dL — ABNORMAL HIGH (ref 70–99)
Glucose-Capillary: 143 mg/dL — ABNORMAL HIGH (ref 70–99)

## 2013-01-09 MED ORDER — VITAL 1.5 CAL PO LIQD
237.0000 mL | ORAL | Status: DC
  Administered 2013-01-09: 100 mL
  Administered 2013-01-09: 200 mL
  Administered 2013-01-10: 237 mL
  Administered 2013-01-10: 300 mL
  Administered 2013-01-10: 237 mL
  Administered 2013-01-10 (×2): 300 mL
  Administered 2013-01-11 – 2013-01-16 (×24): 237 mL
  Filled 2013-01-09 (×48): qty 237

## 2013-01-09 MED ORDER — FREE WATER
200.0000 mL | Status: DC
  Administered 2013-01-09 – 2013-01-16 (×32): 200 mL

## 2013-01-09 NOTE — Progress Notes (Signed)
Pt decannulated per MD order. Pressure dressing applied to stoma. Stoma was large and a small amount of tissue is protruding. Vitals remained WNL. No complications noted. RT will monitor.

## 2013-01-09 NOTE — Progress Notes (Signed)
Pt's trach capped tonight, O2 sats have remained 95-97% while pulse ox probe was on pts foot; he eventually pulled it off x2. Pt has been restless on and off throughout shift. Given tylenol 650mg  at 1950 for possible discomfort, pt calm but awake until he became very restless throwing right leg over left siderail and ativan 1mg  IV given at 2224. Pt appeared to sleep for about 1.5 hours then slept on and off until 0245 when ativan 1mg  IV given again because pt trying to get OOB on left side. Pt calm for about 15 minutes after this dose and has become restless again. Incont of large liquid BM earlier in shift and incont of urine x3. Continue to monitor. Mick Sell RN

## 2013-01-09 NOTE — Progress Notes (Signed)
Physical Therapy Note  Patient Details  Name: Albert [name] MRN: [id number] Date of Birth: Mar 21, 1976 Today's Date: 01/09/2013  Time: [phone] 60 minutes  1:1 No c/o pain.  Seated balance edge of mat with min-mod A today for reaching task focusing on attention, coordination and motor planning.  Pt requires hand over hand assist to grasp and release objects, pt able to initiate movement, limited by visual deficits.  Requires mod cuing for attention to task in min distracting environment, improved self correction of loss of balance today.  Gait training 3 x 25' with +2 assist 3 musketeers style.  Pt with improved trunk control and initiation of advancement of R LE.  Pt limited by strong adductor and flexor tone in R LE limiting movement.  Tone increases with increased work load.  Pt overall with improved attention leading to improved mobility today.   Albert [name] 01/09/2013, 10:43 AM

## 2013-01-09 NOTE — Progress Notes (Addendum)
NUTRITION FOLLOW UP  DOCUMENTATION CODES  Per approved criteria   -Severe malnutrition in the context of acute injury   Intervention:   1. Change to bolus regimen. Advance TF bolus regimen as follows: Initiate 100 ml of Vital 1.5 via tube six times daily. Advance each bolus by 100 ml until goal bolus regimen of 300 ml six times daily is reached. Infuse bolus slowly. This regimen will provide: 2700 kcal, 122 grams protein, and 1375 ml free water. 2. Discontinue Prostat liquid protein 3. Change fluid flushes to 200 ml six times daily, provide 100 ml water before and 100 ml after each bolus. This will provide an additional 1200 ml free water daily. 4. RD to continue to assess enteral tolerance  Addendum: RD contacted by RN regarding patient's need for bolus feedings to allow for pt to be freed from pump to be in an enclosure bed. Per RN, PA Dan requesting bolus change.  Nutrition Dx:   Inadequate oral intake related to inability to eat as evidenced by NPO status. Ongoing.  Goal:   Pt to meet >/= 90% of their estimated nutrition needs. Met.  Monitor:   TF tolerance, weight, labs  Assessment:   Pt admitted after fall, positive for ETOH. Has SDH and SAH, multiple facial fractures, s/p decompressive craniectomy of L SDH. Pt with questionable cardiac arrest with CPR. Pt extubated 4/14, developed seizures and required second craniectomy. Pt had trach and PEG 4/22 and extubated on 4/24 and remains on trach collar. Pt has had persistent fevers this admission due to neuro storming.   Currently receiving Vital 1.5 at 75 ml/hr x 20 hours with Prostat QID. This regimen is meeting at 100% of estimated kcal and protein needs.  Staff reports decrease in number of liquid stools.  Height: Ht Readings from Last 1 Encounters:  01/03/13 6' (1.829 m)    Weight Status:   Wt Readings from Last 1 Encounters:  01/09/13 179 lb 10.8 oz (81.5 kg)  Wt variable. Wife at bedside reports usual weight PTA was  around 200 - 205 lb. This is a weight loss of 16.5 % x 1 month.  Pt meets criteria for severe MALNUTRITION in the context of acute injury as evidenced by 16.5% wt loss x 1 month and moderate muscle wasting.  Re-estimated needs:  Kcal: 2500 - 2800 Protein: at least 120 grams protein Fluid: 2.5 - 2.8 liters daily  Skin: multiple incisions  Diet Order: NPO   Intake/Output Summary (Last 24 hours) at 01/09/13 1011 Last data filed at 01/08/13 1826  Gross per 24 hour  Intake      0 ml  Output    300 ml  Net   -300 ml    Last BM: 5/1 - large loose stool per staff   Labs:   Recent Labs Lab 01/06/13 0700  NA 141  K 4.0  CL 103  CO2 28  BUN 21  CREATININE 0.57  CALCIUM 10.1  GLUCOSE 167*    CBG (last 3)   Recent Labs  01/09/13 0013 01/09/13 0410 01/09/13 0745  GLUCAP 109* 113* 121*    Scheduled Meds: . antiseptic oral rinse  15 mL Mouth Rinse QID  . baclofen  10 mg Per Tube BID  . chlorhexidine  15 mL Mouth Rinse BID  . feeding supplement  30 mL Per Tube QID  . free water  350 mL Per Tube Q6H  . insulin aspart  0-15 Units Subcutaneous Q4H  . lacosamide  100 mg Per  Tube BID  . levETIRAcetam  1,500 mg Per Tube BID  . methylphenidate  10 mg Per Tube BID WC  . multivitamin  5 mL Oral Daily  . pantoprazole sodium  40 mg Per Tube Daily  . phenytoin  100 mg Per Tube TID  . propranolol  80 mg Per Tube Q8H  . saccharomyces boulardii  250 mg Oral BID    Continuous Infusions: . feeding supplement (VITAL 1.5 CAL) 1,000 mL (01/09/13 0620)    Jarold Motto MS, RD, LDN Pager: [phone] After-hours pager: [phone]

## 2013-01-09 NOTE — Progress Notes (Signed)
Orthopedic Tech Progress Note Patient Details:  RONNEL ZUERCHER 1976-02-17 [id number]  Ortho Devices Type of Ortho Device: Abdominal binder Ortho Device/Splint Interventions: Casandra Doffing 01/09/2013, 7:32 PM

## 2013-01-09 NOTE — Progress Notes (Signed)
Occupational Therapy Note  Patient Details  Name: Albert [name] MRN: [id number] Date of Birth: 12-14-1975 Today's Date: 01/09/2013  Time In:  1210  Time Out:  1300.  OT session focused on toilet transfers, toilet hygiene, sustained attention, following one step commands, basic safety, basic orientation, decreasing agitation, wheelchair to bed tranfers, bed mobility.  Patient stated to wife and nursing that he needed to have a BM.  Two person assist to toilet in bathroom to allow for normal context for activity. Despite repeated cueing and increased time allowed, patient was unable to move his bowels. Patient became intermittently agitated that staff were in the bathroom with him.  Attempted to provide basic explanation but patient unable to understand.  Able to decrease agitation by asking patient to take deep breaths, speaking in calm low voice, and touching patient as little as possible while still maintaining safety.  Nursing aware.  Patient returned to bed at end of session due to significant fatigue.   Norton Pastel 01/09/2013, 4:05 PM

## 2013-01-09 NOTE — Progress Notes (Signed)
Subjective/Complaints: Good night. Talks easily with plug in trach. Appears comfortable. Internally restless No ROS due to cognition   Objective: Vital Signs: Blood pressure 136/89, pulse 85, temperature 97.6 F (36.4 C), temperature source Oral, resp. rate 16, height 6' (1.829 m), weight 81.5 kg (179 lb 10.8 oz), SpO2 96.00%. Ct Head Wo Contrast  01/08/2013  *RADIOLOGY REPORT*  Clinical Data: 37 year old male with subdural and subarachnoid hemorrhage after fall.  Traumatic brain injury.  Status post craniotomy evacuation of hematoma with insertion of bone flap into the abdomen.  CT HEAD WITHOUT CONTRAST  Technique:  Contiguous axial images were obtained from the base of the skull through the vertex without contrast.  Comparison: Head CTs 12/31/2012 and earlier.  Brain MRI 12/24/2012.  Findings: Scalp skin staples remain in place.  A percutaneous drain along the craniectomy site has been removed since the prior study. Decreased overlying scalp soft tissue swelling.  Trace residual extradural gas. Stable visualized osseous structures.  Visualized orbit soft tissues are within normal limits.  Paranasal sinuses and mastoids are stable and largely clear.  No pneumocephalus.  Decreased left hemisphere edema as evidenced by left protrusion of brain material through the craniectomy site. Small volume of residual now hypodense extra-axial blood or fluid along the left anterior frontal convexity.  No residual hyperdense intracranial blood products identified.  No ventriculomegaly.  There is mild rightward midline shift now, 3-4 mm.  Basilar cisterns remain patent.  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  No definite left MCA or ACA territory ischemic changes.  Stable and normal gray-white matter differentiation in the right hemisphere and posterior fossa. No suspicious intracranial vascular hyperdensity.  IMPRESSION: 1.  Interval resolved hyperdense intracranial  blood products and overall decreased left hemisphere edema. 2.  At the same time interval evolution of right thalamic and right PCA territory infarcts.  Mild rightward midline shift measuring 3-4 mm. 3.  Small volume residual extra-axial fluid along the left frontal convexity. 4.  Craniectomy changes and interval removal craniectomy site drain.   Original Report Authenticated By: Erskine Speed, M.D.    No results found for this basename: WBC, HGB, HCT, PLT,  in the last 72 hours No results found for this basename: NA, K, CL, CO, GLUCOSE, BUN, CREATININE, CALCIUM,  in the last 72 hours CBG (last 3)   Recent Labs  01/09/13 0013 01/09/13 0410 01/09/13 0745  GLUCAP 109* 113* 121*    Wt Readings from Last 3 Encounters:  01/09/13 81.5 kg (179 lb 10.8 oz)  01/03/13 83.3 kg (183 lb 10.3 oz)  01/03/13 83.3 kg (183 lb 10.3 oz)    Physical Exam:  Eyes:  Pupils reactive to light  Neck: Neck supple. No thyromegaly present.  Cardiovascular: Normal rate and regular rhythm.  Pulmonary/Chest: Effort normal and breath sounds normal. No respiratory distress.  Abdominal: Soft. Bowel sounds are normal. He exhibits no distension.  Neurological: GCS eye subscore is 4. GCS verbal subscore is 1. GCS motor subscore is 4--total score of 9.  Slow to arouse. Makes eye contact. withdrawal to pinch in the left upper and left lower extremity only with facial grimaces noted. Right upper flexor synergy and right lower extremity with extensor posturing. RUE  Tone still is 2-3 Could not perform manual muscle testing secondary to inability to cooperate  Skin:  Craniotomy site with decreased drainage.   Peg and donor sites on abdomen are intact with only minimal drainage   Assessment/Plan: 1. Functional  deficits secondary to severe left SDH, SAH s/p craniectomy, RLAS IV to V which require 3+ hours per day of interdisciplinary therapy in a comprehensive inpatient rehab setting. Physiatrist is providing close team  supervision and 24 hour management of active medical problems listed below. Physiatrist and rehab team continue to assess barriers to discharge/monitor patient progress toward functional and medical goals. FIM: FIM - Bathing Bathing: 1: Two helpers  FIM - Upper Body Dressing/Undressing Upper body dressing/undressing: 1: Total-Patient completed less than 25% of tasks FIM - Lower Body Dressing/Undressing Lower body dressing/undressing: 1: Total-Patient completed less than 25% of tasks  FIM - Toileting Toileting: 0: No continent bowel/bladder events this shift  FIM - Archivist Transfers: 0-Activity did not occur  FIM - Games developer Transfer: 1: Two helpers  FIM - Locomotion: Wheelchair Locomotion: Wheelchair: 1: Total Assistance/staff pushes wheelchair (Pt<25%) FIM - Locomotion: Ambulation Locomotion: Ambulation Assistive Devices: Other (comment) (two person under arms assist) Ambulation/Gait Assistance: 1: +2 Total assist Locomotion: Ambulation: 1: Two helpers  Comprehension Comprehension Mode: Auditory Comprehension: 1-Understands basic less than 25% of the time/requires cueing 75% of the time  Expression Expression Mode: Verbal Expression Assistive Devices: 6-Talk trach valve Expression: 1-Expresses basis less than 25% of the time/requires cueing greater than 75% of the time.  Social Interaction Social Interaction: 1-Interacts appropriately less than 25% of the time. May be withdrawn or combative.  Problem Solving Problem Solving: 1-Solves basic less than 25% of the time - needs direction nearly all the time or does not effectively solve problems and may need a restraint for safety  Memory Memory: 1-Recognizes or recalls less than 25% of the time/requires cueing greater than 75% of the time  Medical Problem List and Plan:  1. 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 Status post craniotomy evacuation  hematoma with insertion of bone flap abdominal left upper quadrant 12/15/2012 with revision after midline shift 12/28/2012 ---RLAS IV to V---improving daily 2. DVT Prophylaxis/Anticoagulation: SCDs. Dopplers negative 3. Numerous facial fractures. Conservative care per ENT  4. Mood: Ativan as needed, Inderal 80 mg every 8 hours.  -re-establish sleep cycle/ sleep chart.  -ritalin  -mitten, wrist, ankle restraint for patient safety. Move to enclosure bed potentially once trach out 5. Neuropsych: This patient is not capable of making decisions on his own behalf.  6. Seizure disorder. Vimpat, Keppra, Dilantin. Weaning vimpat to off--to remain with Dilantin and Keppra  7. Dysphagia/VDRF. Gastrostomy PEG tube/trach tube 12/31/2012 per trauma services.  -downsized to #6 trach on 4/29---has done well---decannulate 8. Alcohol abuse. Counseling  9. Wound care: local care to PEG, crani, and abdominal sites.  -local care, absorption for crani drainage -seems to be decreasing -helmet at some point 10. Temp: afebrile, stable leukocytosis -urine culture and cxr negative, dopplers negative. 11. Spasticity: will add low dose baclofen     LOS (Days) 6 A FACE TO FACE EVALUATION WAS PERFORMED  SWARTZ,ZACHARY T 01/09/2013 8:12 AM

## 2013-01-09 NOTE — Progress Notes (Signed)
Occupational Therapy Session Note  Patient Details  Name: Albert [name] MRN: [id number] Date of Birth: 1976-08-26  Today's Date: 01/09/2013 Time: 0830-0930 Time Calculation (min): 60 min  Short Term Goals: Week 1:  OT Short Term Goal 1 (Week 1): Bed Mobility, right/left with max assist X1 OT Short Term Goal 2 (Week 1): Demo focused attention during ADL for 15 seconds OT Short Term Goal 3 (Week 1): Perform 1 seated grooming activity with moderate assist OT Short Term Goal 4 (Week 1): Dress upper body with max assist X1  Skilled Therapeutic Interventions/Progress Updates:    1:1 Pt in bed when arrived- awake. Pt questioned where he was - when answered with "hosptial" pt accepted answer. Pt throughout session demonstrated some basic intellectual awareness that things are different and his body "wasnt right." Pt with improved visual tracking to therapist's face or object with manipulation in functional activity- however pt with lots of head righting and positioning for him to see more detail- more successful vision in left field and only one large picture instead of visually busy one. Pt with language of confusion but able to correct self with orientation info about his family 25% of time with extra time. Pt participated in dressing and grooming at the sink with increase initiation of bilateral UE and only assistance with follow through due to decreased attention of (only 30-45 sec at a time). PT able to sit EOB with decreased trunk extensor tone for 5 min with mod A. Pt demonstrated great initiation with command to "come sit in the chair;" pt came up off the bed and was able to help pivot into the chair with mod A with =2 for safety! Pt also performed sit to stand in hallway with mod A with therapist on either side of him- pt with decreased extensor tone and taking more weight through right LE.  Therapy Documentation Precautions:  Precautions Precautions: Fall Precaution Comments: L crani, skull cap  removed  and in abdomen Required Braces or Orthoses: Other Brace/Splint Other Brace/Splint: R heel suspension boot Restrictions Weight Bearing Restrictions: No Pain:  no c/o pain or signs or symptoms  See FIM for current functional status  Therapy/Group: Individual Therapy  [name] [facility] 01/09/2013, 9:51 AM

## 2013-01-09 NOTE — Progress Notes (Signed)
Speech Language Pathology Daily Session Note  Patient Details  Name: SAMRAT HAYWARD MRN: [id number] Date of Birth: August 01, 1976  Today's Date: 01/09/2013 Time: 1308-6578 Time Calculation (min): 60 min  Short Term Goals: Week 1: SLP Short Term Goal 1 (Week 1): Patient will demonstrate focused attention for 30 seconds with max A verbal and tactile cues.   (New goal) SLP Short Term Goal 1 - Progress (Week 1): Progressing toward goal SLP Short Term Goal 2 (Week 1): Patient will follow one-step commands with max A verbal and tactile cues.  SLP Short Term Goal 2 - Progress (Week 1): Progressing toward goal SLP Short Term Goal 3 (Week 1): Patient will answer yes/no questions with mod A verbal cues.  SLP Short Term Goal 3 - Progress (Week 1): Progressing toward goal SLP Short Term Goal 4 (Week 1): Patient will increase vocal intensity while wearing PMV with mod A verbal cues.   SLP Short Term Goal 4 - Progress (Week 1): Progressing toward goal SLP Short Term Goal 5 (Week 1): Patient will consume Dys.1 textures and thin liquids with no overt s/s of aspiration for 3 consecutive days with max assist clinician assist.  SLP Short Term Goal 5 - Progress (Week 1): Progressing toward goal  Skilled Therapeutic Interventions: Skilled treatment session focused on sustained attention to basic, familiar tasks. SLP facilitated session with max verbal cueing for sustained attention to basic feeding task in which therapist provided diagnostic p.o. trials (puree, thin liquids) with delayed cough in 2/10 thin liquid cup sips.  SLP suspects this is related to the rate and amount of intake.  Patient was decannulated this am and as a result, SLP also provided hand over stoma dressing to provide a more effortful cough and increased vocal intensity throughout session.  Therapists provided max verbal and tactile cues to follow 1-step commands related to toilet transfer task.  Wife present for end of session which increased  patient's verbal interactions; some continued language of confusion noted today.   FIM:  Comprehension Comprehension Mode: Auditory Comprehension: 1-Understands basic less than 25% of the time/requires cueing 75% of the time Expression Expression Mode: Verbal Expression: 2-Expresses basic 25 - 49% of the time/requires cueing 50 - 75% of the time. Uses single words/gestures. Social Interaction Social Interaction: 2-Interacts appropriately 25 - 49% of time - Needs frequent redirection. Problem Solving Problem Solving: 1-Solves basic less than 25% of the time - needs direction nearly all the time or does not effectively solve problems and may need a restraint for safety Memory Memory: 1-Recognizes or recalls less than 25% of the time/requires cueing greater than 75% of the time FIM - Eating Eating Activity: 2: Hand over hand assist  Pain Pain Assessment Pain Assessment: No/denies pain Pain Score: 0-No pain  Therapy/Group: Individual Therapy  Charlane Ferretti., CCC-SLP [phone]  Clariece Roesler 01/09/2013, 4:39 PM

## 2013-01-09 NOTE — Progress Notes (Signed)
Physical Therapy Session Note  Patient Details  Name: RACHAEL ZAPANTA MRN: [id number] Date of Birth: Dec 23, 1975  Today's Date: 01/09/2013 Time: 1134-1200 Time Calculation (min): 26 min  Skilled Therapeutic Interventions/Progress Updates:     Patient received sitting in wheelchair, asleep, but easily aroused with verbal and tactile cues. Patient missed beginning of session secondary to trach removal. This session focused on initiation with tasks (reaching forward, touching colored rings), color identification (of colored rings, therapist's shirt), and command following (with positioning in chair and engaging in activity). Patient able to follow simple commands approximately 50% of the time (able to reposition L LE on leg rest, reposition head on head/neck support of wheelchair). Patient able to perform one anterior weight shift to reach forward and touch ring on mat in front of him. Patient unable to identify any colored rings. When asked what color therapist's shirt is, patient states "gray" (it was black). Patient able to reposition L LE back on foot rest after removing x2 when verbal cues to do so provided. Patient able to reposition head on head/neck support x3 when verbally cued to do so after turning head.  Patient left seated in wheelchair with seatbelt donned in family room with family members present.  Therapy Documentation Precautions:  Precautions Precautions: Fall Precaution Comments: L crani, skull cap removed  and in abdomen Required Braces or Orthoses: Other Brace/Splint Other Brace/Splint: R heel suspension boot Restrictions Weight Bearing Restrictions: No Pain: Pain Assessment Pain Assessment: No/denies pain Pain Score: 0-No pain Locomotion : Ambulation Ambulation/Gait Assistance: Not tested (comment)  See FIM for current functional status  Therapy/Group: Individual Therapy  Chipper Herb. Tiffiny Worthy, PT, DPT  01/09/2013, 1:58 PM

## 2013-01-09 DEATH — deceased

## 2013-01-10 ENCOUNTER — Inpatient Hospital Stay (HOSPITAL_COMMUNITY): Payer: BC Managed Care – PPO | Admitting: Physical Therapy

## 2013-01-10 ENCOUNTER — Inpatient Hospital Stay (HOSPITAL_COMMUNITY): Payer: BC Managed Care – PPO | Admitting: Occupational Therapy

## 2013-01-10 ENCOUNTER — Inpatient Hospital Stay (HOSPITAL_COMMUNITY): Payer: BC Managed Care – PPO | Admitting: Speech Pathology

## 2013-01-10 LAB — GLUCOSE, CAPILLARY
Glucose-Capillary: 104 mg/dL — ABNORMAL HIGH (ref 70–99)
Glucose-Capillary: 107 mg/dL — ABNORMAL HIGH (ref 70–99)
Glucose-Capillary: 117 mg/dL — ABNORMAL HIGH (ref 70–99)
Glucose-Capillary: 133 mg/dL — ABNORMAL HIGH (ref 70–99)
Glucose-Capillary: 140 mg/dL — ABNORMAL HIGH (ref 70–99)

## 2013-01-10 MED ORDER — LACOSAMIDE 50 MG PO TABS
50.0000 mg | ORAL_TABLET | Freq: Two times a day (BID) | ORAL | Status: DC
  Administered 2013-01-10 – 2013-01-15 (×12): 50 mg
  Filled 2013-01-10 (×12): qty 1

## 2013-01-10 NOTE — Progress Notes (Signed)
Speech Language Pathology Daily Session Note  Patient Details  Name: Albert [name] MRN: [id number] Date of Birth: 1976/01/26  Today's Date: 01/10/2013 Time: 1515-1600 Time Calculation (min): 45 min  Short Term Goals: Week 1: SLP Short Term Goal 1 (Week 1): Patient will demonstrate focused attention for 30 seconds with max A verbal and tactile cues.   (New goal) SLP Short Term Goal 1 - Progress (Week 1): Progressing toward goal SLP Short Term Goal 2 (Week 1): Patient will follow one-step commands with max A verbal and tactile cues.  SLP Short Term Goal 2 - Progress (Week 1): Progressing toward goal SLP Short Term Goal 3 (Week 1): Patient will answer yes/no questions with mod A verbal cues.  SLP Short Term Goal 3 - Progress (Week 1): Progressing toward goal SLP Short Term Goal 4 (Week 1): Patient will increase vocal intensity while wearing PMV with mod A verbal cues.   SLP Short Term Goal 4 - Progress (Week 1): Progressing toward goal SLP Short Term Goal 5 (Week 1): Patient will consume Dys.1 textures and thin liquids with no overt s/s of aspiration for 3 consecutive days with max assist clinician assist.  SLP Short Term Goal 5 - Progress (Week 1): Progressing toward goal  Skilled Therapeutic Interventions: Skilled treatment session focused on cognitive recovery with focus on sustained attention to basic, familiar tasks. SLP facilitated session with max verbal cueing for sustained attention to basic feeding task in which therapist provided diagnostic p.o. trials of (puree, thin liquids) with delayed cough in 50% of thin liquid cup sips.  SLP suspects this is related to the rate and amount of intake and decreased arousal today.  SLP also provided hand over stoma dressing to provide a more effortful cough as needed throughout session, this enabled patient to demonstrate a hard reflexive cough which appeared to reduce suspected penetrates.  SLP attempted a basic repetitive task with hand over had  assist; however, fatigue appeared to impact sustained attention and session was ended by assisting patient with transfer to bed.     FIM:  Comprehension Comprehension Mode: Auditory Comprehension: 1-Understands basic less than 25% of the time/requires cueing 75% of the time Expression Expression Mode: Verbal Expression: 2-Expresses basic 25 - 49% of the time/requires cueing 50 - 75% of the time. Uses single words/gestures. Social Interaction Social Interaction: 2-Interacts appropriately 25 - 49% of time - Needs frequent redirection. Problem Solving Problem Solving: 1-Solves basic less than 25% of the time - needs direction nearly all the time or does not effectively solve problems and may need a restraint for safety Memory Memory: 1-Recognizes or recalls less than 25% of the time/requires cueing greater than 75% of the time FIM - Eating Eating Activity: 2: Hand over hand assist  Pain Pain Assessment Pain Assessment: No/denies pain  Therapy/Group: Individual Therapy  Charlane Ferretti., CCC-SLP [phone]  Albert [name] 01/10/2013, 4:27 PM

## 2013-01-10 NOTE — Progress Notes (Addendum)
Subjective/Complaints: No issues over night. Now in enclosure bed. Calling for needs at times. Denies pain this am A 12 point review of systems has been performed and if not noted above is otherwise negative.    Objective: Vital Signs: Blood pressure 148/87, pulse 83, temperature 97.8 F (36.6 C), temperature source Oral, resp. rate 18, height 6' (1.829 m), weight 81.5 kg (179 lb 10.8 oz), SpO2 100.00%. Ct Head Wo Contrast  01/08/2013  *RADIOLOGY REPORT*  Clinical Data: 37 year old male with subdural and subarachnoid hemorrhage after fall.  Traumatic brain injury.  Status post craniotomy evacuation of hematoma with insertion of bone flap into the abdomen.  CT HEAD WITHOUT CONTRAST  Technique:  Contiguous axial images were obtained from the base of the skull through the vertex without contrast.  Comparison: Head CTs 12/31/2012 and earlier.  Brain MRI 12/24/2012.  Findings: Scalp skin staples remain in place.  A percutaneous drain along the craniectomy site has been removed since the prior study. Decreased overlying scalp soft tissue swelling.  Trace residual extradural gas. Stable visualized osseous structures.  Visualized orbit soft tissues are within normal limits.  Paranasal sinuses and mastoids are stable and largely clear.  No pneumocephalus.  Decreased left hemisphere edema as evidenced by left protrusion of brain material through the craniectomy site. Small volume of residual now hypodense extra-axial blood or fluid along the left anterior frontal convexity.  No residual hyperdense intracranial blood products identified.  No ventriculomegaly.  There is mild rightward midline shift now, 3-4 mm.  Basilar cisterns remain patent.  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  No definite left MCA or ACA territory ischemic changes.  Stable and normal gray-white matter differentiation in the right hemisphere and posterior fossa. No suspicious intracranial  vascular hyperdensity.  IMPRESSION: 1.  Interval resolved hyperdense intracranial blood products and overall decreased left hemisphere edema. 2.  At the same time interval evolution of right thalamic and right PCA territory infarcts.  Mild rightward midline shift measuring 3-4 mm. 3.  Small volume residual extra-axial fluid along the left frontal convexity. 4.  Craniectomy changes and interval removal craniectomy site drain.   Original Report Authenticated By: Erskine Speed, M.D.    No results found for this basename: WBC, HGB, HCT, PLT,  in the last 72 hours No results found for this basename: NA, K, CL, CO, GLUCOSE, BUN, CREATININE, CALCIUM,  in the last 72 hours CBG (last 3)   Recent Labs  01/09/13 1945 01/10/13 0007 01/10/13 0413  GLUCAP 125* 117* 104*    Wt Readings from Last 3 Encounters:  01/09/13 81.5 kg (179 lb 10.8 oz)  01/03/13 83.3 kg (183 lb 10.3 oz)  01/03/13 83.3 kg (183 lb 10.3 oz)    Physical Exam:  Eyes:  Pupils reactive to light  Neck: Neck supple. No thyromegaly present.  Cardiovascular: Normal rate and regular rhythm.  Pulmonary/Chest: Effort normal and breath sounds normal. No respiratory distress.  Abdominal: Soft. Bowel sounds are normal. He exhibits no distension.  Neurological:  Arouses easily. Makes eye contact. Answers simple questions but has limited attention. Poor insight and awareness. Right upper flexor synergy and right lower extremity with extensor posturing. RUE  Tone still is 1-2 and fluctuating  Skin:  Craniotomy site with minimal drainage.   Peg and donor sites on abdomen are intact with only minimal drainage   Assessment/Plan: 1. Functional deficits secondary to severe left SDH, SAH s/p craniectomy, RLAS IV to V which require  3+ hours per day of interdisciplinary therapy in a comprehensive inpatient rehab setting. Physiatrist is providing close team supervision and 24 hour management of active medical problems listed below. Physiatrist and  rehab team continue to assess barriers to discharge/monitor patient progress toward functional and medical goals. FIM: FIM - Bathing Bathing: 1: Two helpers  FIM - Upper Body Dressing/Undressing Upper body dressing/undressing: 1: Total-Patient completed less than 25% of tasks FIM - Lower Body Dressing/Undressing Lower body dressing/undressing: 1: Total-Patient completed less than 25% of tasks  FIM - Toileting Toileting: 1: Two helpers  FIM - Archivist Transfers: 1-Two helpers  FIM - Games developer Transfer: 0: Activity did not occur  FIM - Locomotion: Wheelchair Locomotion: Wheelchair: 1: Total Assistance/staff pushes wheelchair (Pt<25%) FIM - Locomotion: Ambulation Locomotion: Ambulation Assistive Devices: Other (comment) (two person under arms assist) Ambulation/Gait Assistance: Not tested (comment) Locomotion: Ambulation: 0: Activity did not occur  Comprehension Comprehension Mode: Auditory Comprehension: 2-Understands basic 25 - 49% of the time/requires cueing 51 - 75% of the time  Expression Expression Mode: Verbal Expression Assistive Devices: 6-Talk trach valve Expression: 2-Expresses basic 25 - 49% of the time/requires cueing 50 - 75% of the time. Uses single words/gestures.  Social Interaction Social Interaction: 3-Interacts appropriately 50 - 74% of the time - May be physically or verbally inappropriate.  Problem Solving Problem Solving: 1-Solves basic less than 25% of the time - needs direction nearly all the time or does not effectively solve problems and may need a restraint for safety  Memory Memory: 1-Recognizes or recalls less than 25% of the time/requires cueing greater than 75% of the time  Medical Problem List and Plan:  1. 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 Status post craniotomy evacuation hematoma with insertion of bone flap abdominal left upper quadrant 12/15/2012 with  revision after midline shift 12/28/2012 ---RLAS IV to V---improving daily 2. DVT Prophylaxis/Anticoagulation: SCDs. Dopplers negative 3. Numerous facial fractures. Conservative care per ENT  4. Mood: Ativan as needed, Inderal 80 mg every 8 hours.  -re-establish sleep cycle/ sleep chart.  -ritalin  -enclosure bed 5. Neuropsych: This patient is not capable of making decisions on his own behalf.  6. Seizure disorder. Vimpat--taper to 50mg  bid, Keppra, Dilantin.   Dilantin and Keppra  7. Dysphagia/VDRF. Gastrostomy PEG tube/trach tube 12/31/2012 per trauma services.  -decannulated without difficulty 8. Alcohol abuse. Counseling  9. Wound care: local care to PEG, crani, and abdominal sites.  -local care, absorption for crani drainage -seems to be decreasing -helmet at some point 10. Temp: afebrile, stable leukocytosis -urine culture and cxr negative, dopplers negative. 11. Spasticity:   low dose baclofen  -continued therapy, engagement of left side     LOS (Days) 7 A FACE TO FACE EVALUATION WAS PERFORMED  SWARTZ,ZACHARY T 01/10/2013 7:41 AM

## 2013-01-10 NOTE — Progress Notes (Signed)
NUTRITION FOLLOW UP  DOCUMENTATION CODES  Per approved criteria   -Severe malnutrition in the context of acute injury   Intervention:   1. Continue 300 ml of Vital 1.5 via tube six times daily. Infuse bolus slowly. This regimen provides: 2700 kcal, 122 grams protein, and 1375 ml free water. 2. Continue fluid flushes to 200 ml six times daily, provide 100 ml water before and 100 ml after each bolus. This will provide an additional 1200 ml free water daily. 3. RD to continue to assess enteral tolerance  Nutrition Dx:   Inadequate oral intake related to inability to eat as evidenced by NPO status. Ongoing.  Goal:   Pt to meet >/= 90% of their estimated nutrition needs. Met.  Monitor:   TF tolerance, weight, labs  Assessment:   Pt admitted after fall, positive for ETOH. Has SDH and SAH, multiple facial fractures, s/p decompressive craniectomy of L SDH. Pt with questionable cardiac arrest with CPR. Pt extubated 4/14, developed seizures and required second craniectomy. Pt had trach and PEG 4/22 and extubated on 4/24 and remains on trach collar. Pt has had persistent fevers this admission due to neuro storming.   Receiving 300 ml of Vital 1.5 via tube six times daily. This regimen provides: 2700 kcal, 122 grams protein, and 1375 ml free water. RN reports pt is tolerating goal TF regimen well at this time.  De-cannulated yesterday. Placed in enclosure bed.  Staff reports decrease in number of stools.  Height: Ht Readings from Last 1 Encounters:  01/03/13 6' (1.829 m)    Weight Status:   Wt Readings from Last 1 Encounters:  01/09/13 179 lb 10.8 oz (81.5 kg)  Wt variable. Wife at bedside reports usual weight PTA was around 200 - 205 lb. This is a weight loss of 16.5 % x 1 month.  Pt meets criteria for severe MALNUTRITION in the context of acute injury as evidenced by 16.5% wt loss x 1 month and moderate muscle wasting.  Re-estimated needs:  Kcal: 2500 - 2800 Protein: at least 120  grams protein Fluid: 2.5 - 2.8 liters daily  Skin: multiple incisions  Diet Order: NPO   Intake/Output Summary (Last 24 hours) at 01/10/13 0823 Last data filed at 01/10/13 0600  Gross per 24 hour  Intake    900 ml  Output      0 ml  Net    900 ml    Last BM: 5/1 - large loose stool per staff   Labs:   Recent Labs Lab 01/06/13 0700  NA 141  K 4.0  CL 103  CO2 28  BUN 21  CREATININE 0.57  CALCIUM 10.1  GLUCOSE 167*    CBG (last 3)   Recent Labs  01/09/13 1945 01/10/13 0007 01/10/13 0413  GLUCAP 125* 117* 104*    Scheduled Meds: . antiseptic oral rinse  15 mL Mouth Rinse QID  . baclofen  10 mg Per Tube BID  . chlorhexidine  15 mL Mouth Rinse BID  . feeding supplement (VITAL 1.5 CAL)  237 mL Per Tube Q4H while awake  . free water  200 mL Per Tube Q4H while awake  . insulin aspart  0-15 Units Subcutaneous Q4H  . lacosamide  50 mg Per Tube BID  . levETIRAcetam  1,500 mg Per Tube BID  . methylphenidate  10 mg Per Tube BID WC  . multivitamin  5 mL Oral Daily  . pantoprazole sodium  40 mg Per Tube Daily  . phenytoin  100 mg Per Tube TID  . propranolol  80 mg Per Tube Q8H  . saccharomyces boulardii  250 mg Oral BID    Continuous Infusions: none    Jarold Motto MS, RD, LDN Pager: [phone] After-hours pager: [phone]

## 2013-01-10 NOTE — Progress Notes (Signed)
Physical Therapy Note  Patient Details  Name: Albert [name] MRN: [id number] Date of Birth: 08-Apr-1976 Today's Date: 01/10/2013  Time 1: 800-845 45 minutes  1:1 No signs/symptoms of pain.  Treatment focused on functional transfers and sustained attention to task.  Pt easily aroused upon PT arrival, able to perform supine to sit with min A and increased time.  Bed <> w/c transfers with max A, increased time as pt with impaired attention and impaired motor planning.  Once pt attends to task and actively participates transfers can be performed with mod A.  Toilet transfer with +2 for safety, overall mod A.  Pt able to be continent of bowel and bladder with increased time due to poor attention.  Pt able to stand for hygiene with grab bar and max A.  Pt with improved seated balance on toilet and improving assistance with transfers.  Pt requires total A for orientation to place and situation.  Time 2: 1000-1100 60 minutes  1:1 No signs/symptoms of pain.  Standing frame 2 x 10 minutes for sustained attention, motor planning and initiation tasks.  Identifying shapes in L visual field pt able to perform 0/10 correctly, requires total A for all.  After multiple repetitions of shape and letter recognition pt able to verbalize letter "R" and that "[place]" starts with R.  Pt then able to spell "Done" without cuing.  Standing holding baseball with R hand pt able to throw ball with assist multiple times with max cues for attention.  Pt seems to enjoy baseball activity.  Gait training with pt initially requiring +2 total A, able to progress to +2 max A with repetition.  Pt still limited by flexion tone in R LE.  Time 3: 1300-1400 60 minutes co-tx with OT  1:1 No c/o pain.  Pt requires max encouragement and cuing to get OOB for therapy.  [name] assisted by asking pt to get up, pt responds well to wife.  Pt with noticeable decreased attention and increased restlessness throughout session.  Attempt to engage pt in  checker board activity for color recognition and attention, pt unable to participate due to restlessness.  Pt states he needs to "pee".  Pt transfers with +2 assist to toilet, unable to urinate.  Pt then able to engage in looking at family photos with sustained attention 10-20 seconds (improved from 3-5 seconds earlier in session).  Pt with much better physical and cognitive participation in am treatments.   Collin Hendley 01/10/2013, 8:44 AM

## 2013-01-10 NOTE — Progress Notes (Signed)
Occupational Therapy Note  Patient Details  Name: Albert [name] MRN: [id number] Date of Birth: 05/08/1976 Today's Date: 01/10/2013 Time:  0930-1000  (30 min);  60 minutes was total treatment time  1st session Pain:  None Co treat with OT  1st session:  Engaged in utilizing RUE in therapeutic activities, sit to stand, standing balance, WB thruough RLE while standing, following directions,  Sustained attention; attending to the right.  Pt. Cleaned face, ears with washcloth with minimal assist for task initiation.  Pt demonstrated language of confusion, was mildly aggitated just saying a few curse words during session.  Did show a lot of undershooting when reaching for items,  Keeps head laterally flexed to left but able to straighten.  Used RUE with minimal assist for 1 minute then goes to left UE.   2nd Session:  Time:  1300-1330 (30 min);  60 mins. Total treatment time;   Pain:  None; Co treat with PT   Addressed sustained attention, tranfers to toilet, following simple commands.  Pt. Asleep in bed.   Pt resistant to getting out of bed.  Had wife talk to him and he got to EOB with max assist.  Took pt to ortho gym to engage in putting checkers on Environmental consultant.  Pt. Held checkers but not able to maintain sustain attention to place them.  Pt. Restless and pulling on his pants.  He said he had to pee.  Went back to patient's room.  Performed toilet transfer with total assist +2.  Wife present to calm pt about other women being in the bathroom with him.  Unable to urinate.  Pt.given mouth wash.  He followed commands to swish and spit. Addressed attending to family album.  Pt maintained sustained attention to task for 10-20 seconds.  Pt.slightly more agitated in pm session compared to am session.      Albert [name] 01/10/2013, 10:01 AM

## 2013-01-10 NOTE — Progress Notes (Signed)
Occupational Therapy Session Note  Patient Details  Name: Albert [name] MRN: [id number] Date of Birth: 08-Jan-1976  Today's Date: 01/10/2013 Time: 0900-0930 Time Calculation (min): 30 min 0900-1000 - 60 Minute Treatment Time  Short Term Goals: Week 1:  OT Short Term Goal 1 (Week 1): Bed Mobility, right/left with max assist X1 OT Short Term Goal 2 (Week 1): Demo focused attention during ADL for 15 seconds OT Short Term Goal 3 (Week 1): Perform 1 seated grooming activity with moderate assist OT Short Term Goal 4 (Week 1): Dress upper body with max assist X1  Skilled Therapeutic Interventions/Progress Updates:  Patient found seated in recliner with quick release belt donned. Co-treatment with occupational therapist focusing on ADL retraining of dressing tasks and grooming tasks at sink. During ADL, focused on functional use of bilateral UEs, following cues/commands, sit<>stand, grooming tasks seated at sink, problem solving, and initiation. Sit<>stand at sink with total assist X2 to pull pants up to waist. Patient able to follow commands with minimal verbal cues. Patient tends to undershoot and has poor spacial awareness as well as poor coordination/motor control. Language of confusion noticed when conversing with patient. Patient spoke a few cuss words, but was not agitated during session. Also focused on orienting patient to person, place and time.   Precautions:  Precautions Precautions: Fall Precaution Comments: L crani, skull cap removed  and in abdomen Required Braces or Orthoses: Other Brace/Splint Other Brace/Splint: R heel suspension boot Restrictions Weight Bearing Restrictions: No  See FIM for current functional status  Therapy/Group: Co-Treatment  Namiyah Grantham 01/10/2013, 10:03 AM

## 2013-01-10 NOTE — Progress Notes (Signed)
Visited pt's wife in pt room, however did not have the oppty to interact directly w/pt. Also visited pt's parents in family room. Marjory Lies Chaplain  01/10/13 1600  Clinical Encounter Type  Visited With Family

## 2013-01-11 ENCOUNTER — Inpatient Hospital Stay (HOSPITAL_COMMUNITY): Payer: BC Managed Care – PPO | Admitting: Occupational Therapy

## 2013-01-11 DIAGNOSIS — S069X9A Unspecified intracranial injury with loss of consciousness of unspecified duration, initial encounter: Secondary | ICD-10-CM

## 2013-01-11 LAB — GLUCOSE, CAPILLARY
Glucose-Capillary: 95 mg/dL (ref 70–99)
Glucose-Capillary: 99 mg/dL (ref 70–99)
Glucose-Capillary: 153 mg/dL — ABNORMAL HIGH (ref 70–99)
Glucose-Capillary: 112 mg/dL — ABNORMAL HIGH (ref 70–99)
Glucose-Capillary: 118 mg/dL — ABNORMAL HIGH (ref 70–99)
Glucose-Capillary: 108 mg/dL — ABNORMAL HIGH (ref 70–99)

## 2013-01-11 NOTE — Progress Notes (Signed)
Occupational Therapy Session Note  Patient Details  Name: Albert [name] MRN: [id number] Date of Birth: 25-Aug-1976  Today's Date: 01/11/2013 Time: 1110-1150 Time Calculation (min): 40 min  Skilled Therapeutic Interventions/Progress Updates: Scheduled for OT dressing only and is scheduled for night baths.   However, upon arrival patient soaking wet of urine and this clinician and rehab tech  Washed periarea and changed brief and wet from urine abdominal binder.    Patient without clean bottoms to wear; so this clinician and tech donned gown.    Patient very cognitively impaired and with very little participation and required extra time to change brief and gown and was very heavy transfer  Total A x 2  Patient taken to nursing station for supervision while sitting in w/c     Therapy Documentation Precautions:  Precautions Precautions: Fall Precaution Comments: L crani, skull cap removed  and in abdomen Required Braces or Orthoses: Other Brace/Splint Other Brace/Splint: R heel suspension boot Restrictions Weight Bearing Restrictions: No  Pain:? Unable to assess due to patient low level cognition    See FIM for current functional status  Therapy/Group: Individual Therapy Albert [name] [facility] 01/11/2013, 4:03 PM

## 2013-01-11 NOTE — Progress Notes (Signed)
Patient ID: NAPOLEAN SIA, male   DOB: [birth year], 37 y.o.   MRN: [id number] Subjective/Complaints: No issues over night. Now in enclosure bed. Calling for needs at times. Denies pain this am A 12 point review of systems has been performed and if not noted above is otherwise negative.    Objective: Vital Signs: Blood pressure 127/86, pulse 68, temperature 98.1 F (36.7 C), temperature source Oral, resp. rate 18, height 6' (1.829 m), weight 81.5 kg (179 lb 10.8 oz), SpO2 96.00%. No results found. No results found for this basename: WBC, HGB, HCT, PLT,  in the last 72 hours No results found for this basename: NA, K, CL, CO, GLUCOSE, BUN, CREATININE, CALCIUM,  in the last 72 hours CBG (last 3)   Recent Labs  01/10/13 2104 01/10/13 2347 01/11/13 0403  GLUCAP 107* 95 99    Wt Readings from Last 3 Encounters:  01/09/13 81.5 kg (179 lb 10.8 oz)  01/03/13 83.3 kg (183 lb 10.3 oz)  01/03/13 83.3 kg (183 lb 10.3 oz)    Physical Exam:  Eyes:  Pupils reactive to light  Neck: Neck supple. No thyromegaly present.  Cardiovascular: Normal rate and regular rhythm.  Pulmonary/Chest: Effort normal and breath sounds normal. No respiratory distress.  Abdominal: Soft. Bowel sounds are normal. He exhibits no distension.  Neurological:  Sleeping arouses to physical Right upper flexor synergy and right lower extremity with extensor posturing. RUE  Tone still is 1-2 and fluctuating  Skin:  Craniotomy site without drainage.   Peg and donor sites on abdomen are intact with only minimal drainage   Assessment/Plan: 1. 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 Physiatrist is providing close team supervision and 24 hour management of active medical problems listed below. Physiatrist and rehab team continue to assess barriers to discharge/monitor patient  progress toward functional and medical goals. FIM: FIM - Bathing Bathing: 0: Activity did not occur  FIM - Upper Body Dressing/Undressing Upper body dressing/undressing: 1: Total-Patient completed less than 25% of tasks FIM - Lower Body Dressing/Undressing Lower body dressing/undressing: 1: Two helpers (sit<>stand)  FIM - Toileting Toileting: 1: Two helpers  FIM - Archivist Transfers: 1-Two helpers  FIM - Games developer Transfer: 1: Two helpers  FIM - Locomotion: Printmaker: Wheelchair: 1: Total Assistance/staff pushes wheelchair (Pt<25%) FIM - Locomotion: Ambulation Locomotion: Ambulation Assistive Devices: Other (comment) (two person under arms assist) Ambulation/Gait Assistance: Not tested (comment) Locomotion: Ambulation: 0: Activity did not occur  Comprehension Comprehension Mode: Auditory Comprehension: 1-Understands basic less than 25% of the time/requires cueing 75% of the time  Expression Expression Mode: Verbal Expression Assistive Devices: 6-Talk trach valve Expression: 2-Expresses basic 25 - 49% of the time/requires cueing 50 - 75% of the time. Uses single words/gestures.  Social Interaction Social Interaction Mode: Asleep Social Interaction: 2-Interacts appropriately 25 - 49% of time - Needs frequent redirection.  Problem Solving Problem Solving: 1-Solves basic less than 25% of the time - needs direction nearly all the time or does not effectively solve problems and may need a restraint for safety  Memory Memory: 1-Recognizes or recalls less than 25% of the time/requires cueing greater than 75% of the time  Medical Problem List and Plan:  1. 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 Status post craniotomy evacuation hematoma with insertion of bone flap abdominal left upper quadrant 12/15/2012 with revision  after midline shift 12/28/2012 ---RLAS IV to V---improving daily 2. DVT  Prophylaxis/Anticoagulation: SCDs. Dopplers negative 3. Numerous facial fractures. Conservative care per ENT  4. Mood: Ativan as needed, Inderal 80 mg every 8 hours.  -re-establish sleep cycle/ sleep chart.  -ritalin  -enclosure bed 5. Neuropsych: This patient is not capable of making decisions on his own behalf.  6. Seizure disorder. Vimpat--taper to 50mg  bid, Keppra, Dilantin.   Dilantin and Keppra  7. Dysphagia/VDRF. Gastrostomy PEG tube/trach tube 12/31/2012 per trauma services.  -decannulated without difficulty 8. Alcohol abuse. Counseling  9. Wound care: local care to PEG, crani, and abdominal sites.  -local care, absorption for crani drainage -seems to be decreasing -helmet at some point 10. Temp: afebrile, stable leukocytosis -urine culture and cxr negative, dopplers negative. 11. Spasticity:   low dose baclofen  -continued therapy, engagement of left side     LOS (Days) 8 A FACE TO FACE EVALUATION WAS PERFORMED  Sasuke Yaffe E 01/11/2013 7:06 AM

## 2013-01-12 ENCOUNTER — Inpatient Hospital Stay (HOSPITAL_COMMUNITY): Payer: BC Managed Care – PPO | Admitting: Occupational Therapy

## 2013-01-12 LAB — GLUCOSE, CAPILLARY
Glucose-Capillary: 104 mg/dL — ABNORMAL HIGH (ref 70–99)
Glucose-Capillary: 106 mg/dL — ABNORMAL HIGH (ref 70–99)
Glucose-Capillary: 111 mg/dL — ABNORMAL HIGH (ref 70–99)
Glucose-Capillary: 117 mg/dL — ABNORMAL HIGH (ref 70–99)
Glucose-Capillary: 122 mg/dL — ABNORMAL HIGH (ref 70–99)
Glucose-Capillary: 131 mg/dL — ABNORMAL HIGH (ref 70–99)

## 2013-01-12 NOTE — Progress Notes (Signed)
Occupational Therapy Session Note  Patient Details  Name: Albert [name] MRN: [id number] Date of Birth: [birth year]  Today's Date: 01/12/2013 Time: 0915-1010 Time Calculation (min): 55 min  Skilled Therapeutic Interventions/Progress Updates:      Therapy Documentation Precautions:  Precautions Precautions: Fall Precaution Comments: L crani, skull cap removed  and in abdomen Required Braces or Orthoses: Other Brace/Splint Other Brace/Splint: R heel suspension boot Restrictions Weight Bearing Restrictions: No  Pain:denied   ADL:   Patient was scheduled for dressing only today and appeared about 20% more oriented and participated about 20% more accurately for dressing tasks today as compared to yesterday.     Patient required the assist of two people and extra time for initiation and following instructions.   Bed to chair transfer today was Max A to Total A x 2 today.   Wife came in at end of session, and this clinician asked her to bring in more shorts and/or pants as patient only appeared to have 2 pair in his room and reportedly by staff requires frequent cleaning up and changing of clothing   See FIM for current functional status  Therapy/Group: Individual Therapy  [name] [facility] 01/12/2013, 4:18 PM
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
## 2013-01-13 ENCOUNTER — Inpatient Hospital Stay (HOSPITAL_COMMUNITY): Payer: BC Managed Care – PPO

## 2013-01-13 ENCOUNTER — Inpatient Hospital Stay (HOSPITAL_COMMUNITY): Payer: BC Managed Care – PPO | Admitting: Speech Pathology

## 2013-01-13 ENCOUNTER — Inpatient Hospital Stay (HOSPITAL_COMMUNITY): Payer: BC Managed Care – PPO | Admitting: Physical Therapy

## 2013-01-13 DIAGNOSIS — S069X9A Unspecified intracranial injury with loss of consciousness of unspecified duration, initial encounter: Secondary | ICD-10-CM

## 2013-01-13 LAB — GLUCOSE, CAPILLARY
Glucose-Capillary: 103 mg/dL — ABNORMAL HIGH (ref 70–99)
Glucose-Capillary: 108 mg/dL — ABNORMAL HIGH (ref 70–99)
Glucose-Capillary: 111 mg/dL — ABNORMAL HIGH (ref 70–99)
Glucose-Capillary: 118 mg/dL — ABNORMAL HIGH (ref 70–99)
Glucose-Capillary: 172 mg/dL — ABNORMAL HIGH (ref 70–99)

## 2013-01-13 MED ORDER — METHYLPHENIDATE HCL 5 MG PO TABS
15.0000 mg | ORAL_TABLET | Freq: Two times a day (BID) | ORAL | Status: DC
  Administered 2013-01-13 – 2013-01-27 (×29): 15 mg
  Filled 2013-01-13 (×13): qty 3
  Filled 2013-01-13: qty 1
  Filled 2013-01-13 (×7): qty 3
  Filled 2013-01-13: qty 2
  Filled 2013-01-13 (×8): qty 3

## 2013-01-13 NOTE — Progress Notes (Signed)
Physical Therapy Session Note  Patient Details  Name: Albert [name] MRN: [id number] Date of Birth: [birth year]  Today's Date: 01/13/2013 Time:Session #1:  4098-1191, Session #2: [id number] Time Calculation (min): Session #1: 40 min, Session #2: 59 min  Short Term Goals: Week 2:  PT Short Term Goal 1 (Week 2): Patient will be able to perform bed mobility with Max-Assist. PT Short Term Goal 2 (Week 2): Patient will be able to perform transfers with Max-Assist. PT Short Term Goal 3 (Week 2): Patient will be able to sit EOB x 5 minutes with Mod-Assist  Skilled Therapeutic Interventions/Progress Updates:    Session #1: (co-session with another PT): This session focused on sit to stand and standing in midline out of recliner chair.  Verbal and manual cues for hand placement and terminal knee extension on right.  Manual assist needed for midline weight shift.  Two person max assist for sit to stand transitions and for standing to find midline.  Second therapist needed for skilled hands on gait.  Four people total: two supporting trunk and helping with weight shifting while taking steps, one for foot placement bil as well as stabilizing and blocking alternating legs during gait (right expecially due to adduct and flexion tone during gait on this side).  Last person needed (wife to follow closely with WC to encourage increased safety and increased gait distance.  One seated rest break during gait.  2-3 person max assist. ~40' ambulated total.   Session #2: This session focused on standing endurance and weight shifting to reach for objects in standing in the standing frame with two person assist.  Pt's visual/perceptual deficits hinder his ability to see and use depth perception to grasp objects without assistance.  We worked on [employer], attention to task, orientation to place and situation.  Pt needed manual facilitation in standing for weight shift to the left and to help maintain upright trunk and  terminal extension at the knee and hip on his right side.  Pt fatigued easily both mentally and physically which lead to increased frustration by the end of the session.   Therapy Documentation Precautions:  Precautions Precautions: Fall Precaution Comments: L crani, skull cap removed  and in abdomen Required Braces or Orthoses: Other Brace/Splint Other Brace/Splint: abdominal binder Restrictions Weight Bearing Restrictions: No  See FIM for current functional status  Therapy/Group: Individual Therapy and Co-Treatment  Edmund Rick B. Tahsin Benyo, PT, DPT [phone]   01/13/2013, 2:59 PM

## 2013-01-13 NOTE — Progress Notes (Signed)
Occupational Therapy Session Note  Patient Details  Name: Albert [name] MRN: [id number] Date of Birth: 06/29/1976  Today's Date: 01/13/2013 Time: 0900-0930 Time Calculation (min): 30 min   Skilled Therapeutic Interventions/Progress Updates:    Cotx with COTA with focus on task initiation, attention to task, static standing balance, and activity tolerance.  Pt initiates tasks on command but requires assistance to complete tasks.  Pt exhibits difficulty when reaching for objects - usually undershooting but often reaching for objects to the side.  Pt postures head and continues moving head and eyes as though he were attempting to locate object.  Pt demonstrates difficulty visually focusing on objects throughout session.    Therapy Documentation Precautions:  Precautions Precautions: Fall Precaution Comments: L crani, skull cap removed  and in abdomen Required Braces or Orthoses: Other Brace/Splint Other Brace/Splint: R heel suspension boot Restrictions Weight Bearing Restrictions: No General:   Vital Signs: Therapy Vitals Pulse Rate: 74 BP: 100/70 mmHg Pain: Pain Assessment Pain Assessment: No/denies pain  See FIM for current functional status  Therapy/Group: Cotreatment Therapy  [name], [name] 01/13/2013, 10:05 AM
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
## 2013-01-13 NOTE — Progress Notes (Signed)
Physical Therapy Session Note  Patient Details  Name: Albert [name] MRN: [id number] Date of Birth: [birth year]  Today's Date: 01/13/2013 Time: 9147-8295 Time Calculation (min): 30 min  Skilled Therapeutic Interventions/Progress Updates:    Pt received in hallway ready to ambulated. Session focused on neuromuscular rehabilitation during ambulation with two skilled therapists and one PT tech (+3 assist). Pt ambulated while PTs facilitated Lt. Weight shift for Rt. LE swing, Rt/Lt. Initiation of swing, modulation of adduction/abduction bilaterally, Rt. Hip/knee extension during stance, trunk extension. Pt needs mod/max encouragement to continue activity. Pt at short focused level of attention and needs very frequent (max) verbal cues to attend to task.    Therapy Documentation Precautions:  Precautions Precautions: Fall Precaution Comments: L crani, skull cap removed  and in abdomen Required Braces or Orthoses: Other Brace/Splint Other Brace/Splint: R heel suspension boot Restrictions Weight Bearing Restrictions: No Pain: Pain Assessment Pain Assessment: No/denies pain  See FIM for current functional status  Therapy/Group: Co-Treatment  [name] 01/13/2013, 12:36 PM

## 2013-01-13 NOTE — Plan of Care (Signed)
Problem: RH SAFETY Goal: RH STG DEMO UNDERSTANDING HOME SAFETY PRECAUTIONS Outcome: Progressing Family verbalizes understanding of home precautions for brain injuries .  Problem: RH COGNITION-NURSING Goal: RH STG USES MEMORY AIDS/STRATEGIES W/ASSIST TO PROBLEM SOLVE STG Uses Memory Aids/Strategies With max Assistance to Problem Solve.  Outcome: Not Progressing Total assist  Goal: RH STG ANTICIPATES NEEDS/CALLS FOR ASSIST W/ASSIST/CUES STG Anticipates Needs/Calls for Assist With max Assistance/Cues.  Outcome: Not Progressing Total assist

## 2013-01-13 NOTE — Progress Notes (Signed)
Physical Therapy Note  Patient Details  Name: Albert [name] MRN: [id number] Date of Birth: 1976-01-19 Today's Date: 01/13/2013  1445-1525 (40 minutes) individual Pain: no distress noted Focus of treatment: therapeutic exercise focused on bilateral extremity coordination; standing tolerance; gait training Treatment: Pt up in wc ; transfer stand/turn wc >< Nustep max assist for balance with increased lean to right; Nustep Level 4 X 10 minutes min assist to maintain activity using bilateral extremities; sit to stand to EVA walker mod assist X 2 . Pt stands with flexed RT knee; gait 3 steps with EVA walker max assist +2 for safety. Pt attempts to advance RT LE with max adduction and difficulty shifting weight to RT LE to advance left. Pt returned to room and placed in [place] bed.    Sharlize Hoar,JIM 01/13/2013, 3:22 PM

## 2013-01-13 NOTE — Progress Notes (Signed)
Speech Language Pathology Weekly Progress Note & Daily Session Note  Patient Details  Name: Albert [name] MRN: [id number] Date of Birth: 1976/05/05  Today's Date: 01/13/2013  Short Term Goals: Week 1: SLP Short Term Goal 1 (Week 1): Patient will demonstrate focused attention for 30 seconds with max A verbal and tactile cues.   (New goal) SLP Short Term Goal 1 - Progress (Week 1): Met SLP Short Term Goal 2 (Week 1): Patient will follow one-step commands with max A verbal and tactile cues.  SLP Short Term Goal 2 - Progress (Week 1): Progressing toward goal SLP Short Term Goal 3 (Week 1): Patient will answer yes/no questions with mod A verbal cues.  SLP Short Term Goal 3 - Progress (Week 1): Progressing toward goal SLP Short Term Goal 4 (Week 1): Patient will increase vocal intensity while wearing PMV with mod A verbal cues.   SLP Short Term Goal 4 - Progress (Week 1): Met SLP Short Term Goal 5 (Week 1): Patient will consume Dys.1 textures and thin liquids with no overt s/s of aspiration for 3 consecutive days with max assist clinician assist.  SLP Short Term Goal 5 - Progress (Week 1): Progressing toward goal Week 2: SLP Short Term Goal 1 (Week 2): Patient will demonstrate sustained attention for 1 minute with max A verbal and tactile cues.   SLP Short Term Goal 2 (Week 2): Patient will follow one-step commands with max A verbal and tactile cues.  SLP Short Term Goal 3 (Week 2): Patient will answer yes/no questions with mod A verbal cues.  SLP Short Term Goal 4 (Week 2): Patient will consume Dys.1 textures and thin liquids with no overt s/s of aspiration for 3 consecutive days with max assist clinician assist.   Weekly Progress Updates: Patient met 2 out of 5 short term goals this reporting period due to gains in focused attention and voicing.  Patient progressed from trials with PMSV to full toleration and decannulation.  Patient has been consuming trials of puree without overt s/s of  aspiration; however, demonstrates intermittent cough with thin liquid cup sips.  Patient also progressed from [place] Level IV to V.  Given this patient continues to require skilled SLP services to assist with addressing goals for cognitive recovery, initiation of least restrictive p.o. intake and reducing burden of care prior to discharge.     SLP Intensity: Minumum of 1-2 x/day, 30 to 90 minutes SLP Frequency: 5 out of 7 days SLP Duration/Estimated Length of Stay: 4 weeks SLP Treatment/Interventions: Cognitive remediation/compensation;Internal/external aids;Environmental controls;Therapeutic Activities;Patient/family education;Dysphagia/aspiration precaution training   Speech Language Pathology Daily Session Note  Patient Details  Name: Albert [name] MRN: [id number] Date of Birth: 1976/05/11  Today's Date: 01/13/2013 Time: 7829-5621 Time Calculation (min): 45 min  Short Term Goals: Week 2: SLP Short Term Goal 1 (Week 2): Patient will demonstrate sustained attention for 1 minute with max A verbal and tactile cues.   SLP Short Term Goal 2 (Week 2): Patient will follow one-step commands with max A verbal and tactile cues.  SLP Short Term Goal 3 (Week 2): Patient will answer yes/no questions with mod A verbal cues.  SLP Short Term Goal 4 (Week 2): Patient will consume Dys.1 textures and thin liquids with no overt s/s of aspiration for 3 consecutive days with max assist clinician assist.   Skilled Therapeutic Interventions: Skilled treatment session focused on cognitive recovery with focus on sustained attention to basic, familiar tasks. SLP facilitated session with max verbal cueing  for sustained attention to basic feeding task in which therapist provided diagnostic p.o. trials of (puree, thin liquids) with delayed cough in 30% of thin liquid cup sips.  No overt s/s of aspiration were observed with puree trials.  SLP suspects this is related to the rate and amount of intake and cues for small  sips were not effective.  SLP also provided hand over stoma dressing to provide a more effortful cough as needed throughout session, this enabled patient to demonstrate a hard reflexive cough which appeared to reduce suspected penetrates.  SLP attempted a basic repetitive task with hand over had assist; however, decreased sustained attention and confusion appeared to impact function.       FIM:  Comprehension Comprehension Mode: Auditory Comprehension: 1-Understands basic less than 25% of the time/requires cueing 75% of the time Expression Expression Mode: Verbal Expression: 1-Expresses basis less than 25% of the time/requires cueing greater than 75% of the time. Social Interaction Social Interaction: 2-Interacts appropriately 25 - 49% of time - Needs frequent redirection. Problem Solving Problem Solving: 1-Solves basic less than 25% of the time - needs direction nearly all the time or does not effectively solve problems and may need a restraint for safety Memory Memory: 1-Recognizes or recalls less than 25% of the time/requires cueing greater than 75% of the time FIM - Eating Eating Activity: 2: Hand over hand assist  Pain Pain Assessment Pain Assessment: No/denies pain  Therapy/Group: Individual Therapy  Charlane Ferretti., CCC-SLP [phone]  Marvelous [place] 01/13/2013, 5:47 PM

## 2013-01-13 NOTE — Progress Notes (Signed)
Patient ID: Albert [name], male   DOB: 1976/03/16, 37 y.o.   MRN: [id number] Subjective/Complaints: Awakens upon my entering room. Follows simple commands. Easily distracted. A little restless. A 12 point review of systems has been performed and if not noted above is otherwise negative.    Objective: Vital Signs: Blood pressure 100/70, pulse 74, temperature 98.1 F (36.7 C), temperature source Oral, resp. rate 18, height 6' (1.829 m), weight 81.5 kg (179 lb 10.8 oz), SpO2 100.00%. No results found. No results found for this basename: WBC, HGB, HCT, PLT,  in the last 72 hours No results found for this basename: NA, K, CL, CO, GLUCOSE, BUN, CREATININE, CALCIUM,  in the last 72 hours CBG (last 3)   Recent Labs  01/12/13 2021 01/12/13 2355 01/13/13 0414  GLUCAP 106* 111* 103*    Wt Readings from Last 3 Encounters:  01/09/13 81.5 kg (179 lb 10.8 oz)  01/03/13 83.3 kg (183 lb 10.3 oz)  01/03/13 83.3 kg (183 lb 10.3 oz)    Physical Exam:  Eyes:  Pupils reactive to light  Neck: Neck supple. No thyromegaly present.  Cardiovascular: Normal rate and regular rhythm.  Pulmonary/Chest: Effort normal and breath sounds normal. No respiratory distress.  Abdominal: Soft. Bowel sounds are normal. He exhibits no distension.  Neurological:  Sleeping arouses to physical Right upper flexor synergy and right lower extremity with extensor posturing. RUE  Tone still is 1-2 and fluctuating  Skin:  Craniotomy site without drainage.   Peg and donor sites on abdomen are intact with only minimal drainage   Assessment/Plan: 1. 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 Physiatrist is providing close team supervision and 24 hour management of active medical problems listed below. Physiatrist and rehab team continue to assess barriers to discharge/monitor patient  progress toward functional and medical goals. FIM: FIM - Bathing Bathing Steps Patient Completed:  (night bath) Bathing: 0: Activity did not occur  FIM - Upper Body Dressing/Undressing Upper body dressing/undressing: 1: Two helpers FIM - Lower Body Dressing/Undressing Lower body dressing/undressing: 1: Two helpers  FIM - Toileting Toileting: 0: Activity did not occur  FIM - Archivist Transfers: 0-Activity did not occur  FIM - Games developer Transfer: 1: Two helpers  FIM - Locomotion: Wheelchair Locomotion: Wheelchair: 1: Total Assistance/staff pushes wheelchair (Pt<25%) FIM - Locomotion: Ambulation Locomotion: Ambulation Assistive Devices: Other (comment) (two person under arms assist) Ambulation/Gait Assistance: Not tested (comment) Locomotion: Ambulation: 0: Activity did not occur  Comprehension Comprehension Mode: Auditory Comprehension: 1-Understands basic less than 25% of the time/requires cueing 75% of the time  Expression Expression Mode: Verbal Expression Assistive Devices: 6-Talk trach valve Expression: 2-Expresses basic 25 - 49% of the time/requires cueing 50 - 75% of the time. Uses single words/gestures.  Social Interaction Social Interaction Mode: Asleep Social Interaction: 2-Interacts appropriately 25 - 49% of time - Needs frequent redirection.  Problem Solving Problem Solving: 1-Solves basic less than 25% of the time - needs direction nearly all the time or does not effectively solve problems and may need a restraint for safety  Memory Memory: 1-Recognizes or recalls less than 25% of the time/requires cueing greater than 75% of the time  Medical Problem List and Plan:  1. 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 Status post craniotomy evacuation hematoma with insertion of bone flap abdominal left upper quadrant 12/15/2012 with  revision after midline shift 12/28/2012 ---RLAS IV to  V---improving 2. DVT Prophylaxis/Anticoagulation: SCDs. Dopplers negative 3. Numerous facial fractures. Conservative care per ENT  4. Mood: Ativan as needed, Inderal 80 mg every 8 hours.  -re-establish sleep cycle/ sleep chart.  -ritalin- continue to titrate -enclosure bed still required 5. Neuropsych: This patient is not capable of making decisions on his own behalf.  6. Seizure disorder. Vimpat--taper to 50mg  bid, Keppra, Dilantin.   Dilantin and Keppra  7. Dysphagia/VDRF. Gastrostomy PEG tube/trach tube 12/31/2012 per trauma services.  -decannulated without difficulty--occlusive dressing 8. Alcohol abuse. Counseling  9. Wound care: local care to PEG, crani, and abdominal sites.  -local care, absorption for crani drainage -seems to be decreasing -helmet at some point 10. Temp: afebrile, stable leukocytosis -urine culture and cxr negative, dopplers negative. 11. Spasticity:   low dose baclofen  -continued therapy, engagement of left side     LOS (Days) 10 A FACE TO FACE EVALUATION WAS PERFORMED  Albert [name] 01/13/2013 8:05 AM

## 2013-01-13 NOTE — Progress Notes (Signed)
Orthopedic Tech Progress Note Patient Details:  Albert [name] Sep 28, 1975 [id number] Abdominal binder ordered and delivered to patient's room.   Ortho Devices Type of Ortho Device: Abdominal binder Ortho Device/Splint Interventions: Ordered   [name] 01/13/2013, 1:04 PM

## 2013-01-13 NOTE — Progress Notes (Signed)
Occupational Therapy Note  Patient Details  Name: Albert [name] MRN: [id number] Date of Birth: [birth year] Today's Date: 01/13/2013  Time: 9:30-10am ( .) Pt seen for a co-tx session with COTA focusing on bed mobility, transfers and BADLs. Upon arrival, pt supine in bed. Bed wet with urine. Nursing made aware bed required changing. Pt sat EOB and into wheelchair with mod-max A. Pt sat in wheelchair at sink to get washed up (not a full bath) and dressed. Pt demonstrating decreased attention and initiation of tasks during wash up and dressing. Pt stood 4-5x total during BADLs, leaning/pushing to R. Pt attempted to thread L leg into pants with hand over hand, getting frustrated easily. Total A for upper and LB dressing. Pt not oriented to place or date. Showed pt calendar and went over date/day/year. Pt left with PT for next therapy session. No pain reported during session.   Endrit Gittins Hessie Diener 01/13/2013, 12:43 PM

## 2013-01-14 ENCOUNTER — Inpatient Hospital Stay (HOSPITAL_COMMUNITY): Payer: BC Managed Care – PPO | Admitting: Speech Pathology

## 2013-01-14 ENCOUNTER — Inpatient Hospital Stay (HOSPITAL_COMMUNITY): Payer: BC Managed Care – PPO | Admitting: Physical Therapy

## 2013-01-14 ENCOUNTER — Inpatient Hospital Stay (HOSPITAL_COMMUNITY): Payer: BC Managed Care – PPO

## 2013-01-14 LAB — GLUCOSE, CAPILLARY
Glucose-Capillary: 138 mg/dL — ABNORMAL HIGH (ref 70–99)
Glucose-Capillary: 85 mg/dL (ref 70–99)
Glucose-Capillary: 94 mg/dL (ref 70–99)

## 2013-01-14 NOTE — Progress Notes (Signed)
Occupational Therapy Session Note  Patient Details  Name: Albert [name] MRN: [id number] Date of Birth: [birth year]  Today's Date: 01/14/2013 Time: 4098-1191 Time Calculation (min): 25 min  Short Term Goals: Week 2:     Skilled Therapeutic Interventions/Progress Updates:    Cotx with COTA for dressing tasks.  Focus on task initiation, attention to tasks, following one step commands, standing balance, and safety awareness.  Pt follows one step commands but continues to require physical assistance to complete task.  Pt requires steady A to max A for standing balance at sink.  Pt requires max A for attention to tasks.  Pt continues to demonstrate difficulty in reaching for items and usually undershoots or reaches to either side of object.    Therapy Documentation Precautions:  Precautions Precautions: Fall Precaution Comments: L crani, skull cap removed  and in abdomen Required Braces or Orthoses: Other Brace/Splint Other Brace/Splint: abdominal binder Restrictions Weight Bearing Restrictions: No General:   Vital Signs: Therapy Vitals Pulse Rate: 63 BP: 124/77 mmHg Pain: Pain Assessment Pain Assessment: No/denies pain ADL:   Exercises:   Other Treatments:    See FIM for current functional status  Therapy/Group: Co-Treatment  [name], [name] 01/14/2013, 9:58 AM

## 2013-01-14 NOTE — Progress Notes (Signed)
Patient ID: Albert [name], male   DOB: 06-15-1976, 37 y.o.   MRN: [id number] Subjective/Complaints: Easily awakens. Very distracted. Follows simple commands when encouraged. A 12 point review of systems has been performed and if not noted above is otherwise negative.    Objective: Vital Signs: Blood pressure 124/77, pulse 63, temperature 97.9 F (36.6 C), temperature source Oral, resp. rate 17, height 6' (1.829 m), weight 81.5 kg (179 lb 10.8 oz), SpO2 100.00%. No results found. No results found for this basename: WBC, HGB, HCT, PLT,  in the last 72 hours No results found for this basename: NA, K, CL, CO, GLUCOSE, BUN, CREATININE, CALCIUM,  in the last 72 hours CBG (last 3)   Recent Labs  01/13/13 0805 01/13/13 1145 01/13/13 1646  GLUCAP 108* 172* 118*    Wt Readings from Last 3 Encounters:  01/09/13 81.5 kg (179 lb 10.8 oz)  01/03/13 83.3 kg (183 lb 10.3 oz)  01/03/13 83.3 kg (183 lb 10.3 oz)    Physical Exam:  Eyes:  Pupils reactive to light  Neck: Neck supple. No thyromegaly present.  Cardiovascular: Normal rate and regular rhythm.  Pulmonary/Chest: Effort normal and breath sounds normal. No respiratory distress.  Abdominal: Soft. Bowel sounds are normal. He exhibits no distension.  Neurological:  Sleeping arouses to physical Right upper flexor synergy and right lower extremity with extensor posturing. RUE  Tone still is trace to 1. RUE is grossly 3-4/5. RLE is 3-4 prox to 0/5 at ankle. Senses pain on right. DTR's 3+  Skin:  Craniotomy site without drainage.   Peg and donor sites on abdomen are intact with only minimal drainage   Assessment/Plan: 1. 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 Physiatrist is providing close team supervision and 24 hour management of active medical problems listed below. Physiatrist and rehab team  continue to assess barriers to discharge/monitor patient progress toward functional and medical goals. FIM: FIM - Bathing Bathing Steps Patient Completed:  (night bath) Bathing: 0: Activity did not occur  FIM - Upper Body Dressing/Undressing Upper body dressing/undressing: 1: Total-Patient completed less than 25% of tasks FIM - Lower Body Dressing/Undressing Lower body dressing/undressing: 1: Total-Patient completed less than 25% of tasks  FIM - Toileting Toileting: 0: Activity did not occur  FIM - Archivist Transfers: 0-Activity did not occur  FIM - Games developer Transfer: 1: Two helpers  FIM - Locomotion: Wheelchair Locomotion: Wheelchair: 1: Total Assistance/staff pushes wheelchair (Pt<25%) FIM - Locomotion: Ambulation Locomotion: Ambulation Assistive Devices: Other (comment) (two person under arms assist) Ambulation/Gait Assistance: Not tested (comment) Locomotion: Ambulation: 1: Two helpers  Comprehension Comprehension Mode: Auditory Comprehension: 1-Understands basic less than 25% of the time/requires cueing 75% of the time  Expression Expression Mode: Verbal Expression Assistive Devices: 6-Other (Comment) (trach/stoma site) Expression: 1-Expresses basis less than 25% of the time/requires cueing greater than 75% of the time.  Social Interaction Social Interaction Mode: Asleep Social Interaction: 2-Interacts appropriately 25 - 49% of time - Needs frequent redirection.  Problem Solving Problem Solving: 1-Solves basic less than 25% of the time - needs direction nearly all the time or does not effectively solve problems and may need a restraint for safety  Memory Memory: 1-Recognizes or recalls less than 25% of the time/requires cueing greater than 75% of the time  Medical Problem List and Plan:  1. left subdural hematoma and subarachnoid  hemorrhage/skull fracture/TBI after fall. Status post craniotomy evacuation hematoma with insertion of  bone flap abdominal left upper quadrant 12/15/2012 with revision after midline shift 12/28/2012 ---RLAS V at this point 2. DVT Prophylaxis/Anticoagulation: SCDs. Dopplers negative 3. Numerous facial fractures. Conservative care per ENT  4. Mood: Ativan as needed, Inderal 80 mg every 8 hours.  -re-establish sleep cycle/ sleep chart.  -ritalin- continue to titrate -enclosure bed still required 5. Neuropsych: This patient is not capable of making decisions on his own behalf.  6. Seizure disorder. Vimpat--taper to 50mg  bid, Keppra, Dilantin.   Dilantin and Keppra  7. Dysphagia/VDRF. Gastrostomy PEG tube/trach tube 12/31/2012 per trauma services.  -decannulated without difficulty--occlusive dressing--stoma needs to be packed  -hopeful to begin diet soon 8. Alcohol abuse. Counseling  9. Wound care: local care to PEG, crani, and abdominal sites.  -local care--remove staples -helmet at some point 10. Temp: afebrile, stable leukocytosis -urine culture and cxr negative, dopplers negative. 11. Spasticity:   low dose baclofen  -continued therapy, engagement of right side     LOS (Days) 11 A FACE TO FACE EVALUATION WAS PERFORMED  Albert [name] 01/14/2013 7:37 AM

## 2013-01-14 NOTE — Progress Notes (Signed)
Speech Language Pathology Daily Session Note  Patient Details  Name: Albert [name] MRN: [id number] Date of Birth: 1976-09-02  Today's Date: 01/14/2013 Time: 7829-5621 Time Calculation (min): 45 min  Short Term Goals: Week 2: SLP Short Term Goal 1 (Week 2): Patient will demonstrate sustained attention for 1 minute with max A verbal and tactile cues.   SLP Short Term Goal 2 (Week 2): Patient will follow one-step commands with max A verbal and tactile cues.  SLP Short Term Goal 3 (Week 2): Patient will answer yes/no questions with mod A verbal cues.  SLP Short Term Goal 4 (Week 2): Patient will consume Dys.1 textures and thin liquids with no overt s/s of aspiration for 3 consecutive days with max assist clinician assist.   Skilled Therapeutic Interventions: Skilled treatment session focused on cognitive recovery with focus on sustained attention to basic, familiar tasks. SLP facilitated session with max verbal cueing for sustained attention to basic feeding task in which therapist provided diagnostic p.o. trials of liquids.  Patient consumed large cup sips of thin lquids with delayed cough in 30% of trials;  Patient demonstraed hard effortful coughs which appeared to reduce suspected penetrates.  Large sips of nectar-thick liquids elicited no overt s/s of aspiration.  SLP defers diet decision to MD to consider overall resp function in conjunction with mild aspiration rais.  SLP also facilitated session with basic repetitive task with hand over had assist which clinician was able to fade to max assist verbal, visual and tactile cues for sustained attention to task for ~45 seconds.     FIM:  Comprehension Comprehension Mode: Auditory Comprehension: 1-Understands basic less than 25% of the time/requires cueing 75% of the time Expression Expression Mode: Verbal Expression: 1-Expresses basis less than 25% of the time/requires cueing greater than 75% of the time. Social Interaction Social Interaction:  2-Interacts appropriately 25 - 49% of time - Needs frequent redirection. Problem Solving Problem Solving: 1-Solves basic less than 25% of the time - needs direction nearly all the time or does not effectively solve problems and may need a restraint for safety Memory Memory: 1-Recognizes or recalls less than 25% of the time/requires cueing greater than 75% of the time FIM - Eating Eating Activity: 2: Hand over hand assist  Pain Pain Assessment Pain Assessment: No/denies pain  Therapy/Group: Individual Therapy  Charlane Ferretti., CCC-SLP [phone]  [name] 01/14/2013, 2:02 PM

## 2013-01-14 NOTE — Progress Notes (Signed)
NUTRITION FOLLOW UP  DOCUMENTATION CODES  Per approved criteria   -Severe malnutrition in the context of acute injury   Intervention:   1. Continue 300 ml of Vital 1.5 via tube six times daily. Infuse bolus slowly. This regimen provides: 2700 kcal, 122 grams protein, and 1375 ml free water. 2. Continue fluid flushes to 200 ml six times daily, provide 100 ml water before and 100 ml after each bolus. This will provide an additional 1200 ml free water daily. 3. Diet initiation per SLP. Will adjust TF regimen accordingly. 4. RD to continue to assess enteral tolerance  Nutrition Dx:   Inadequate oral intake related to inability to eat as evidenced by NPO status. Ongoing.  Goal:   Pt to meet >/= 90% of their estimated nutrition needs. Met.  Monitor:   TF tolerance, weight, labs  Assessment:   Pt admitted after fall, positive for ETOH. Has SDH and SAH, multiple facial fractures, s/p decompressive craniectomy of L SDH. Pt with questionable cardiac arrest with CPR. Pt extubated 4/14, developed seizures and required second craniectomy. Pt had trach and PEG 4/22 and extubated on 4/24 and remains on trach collar. Pt has had persistent fevers this admission due to neuro storming.   Receiving 300 ml of Vital 1.5 via tube six times daily. This regimen provides: 2700 kcal, 122 grams protein, and 1375 ml free water. RN reports pt is tolerating goal TF regimen well at this time. She notes that pt had a large BM last night however it had been a "few days" since he had his previous BM. No additional BM since that time.  Continues to work with SLP on po trials of purees and thin liquids. MD is hopeful to start diet soon.  Height: Ht Readings from Last 1 Encounters:  01/03/13 6' (1.829 m)    Weight Status:   Wt Readings from Last 1 Encounters:  01/09/13 179 lb 10.8 oz (81.5 kg)  Wt variable. Wife at bedside reports usual weight PTA was around 200 - 205 lb. This is a weight loss of 16.5 % x 1  month.  Pt meets criteria for severe MALNUTRITION in the context of acute injury as evidenced by 16.5% wt loss x 1 month and moderate muscle wasting.  Re-estimated needs:  Kcal: 2500 - 2800 Protein: at least 120 grams protein Fluid: 2.5 - 2.8 liters daily  Skin: multiple incisions  Diet Order: NPO   Intake/Output Summary (Last 24 hours) at 01/14/13 1102 Last data filed at 01/14/13 0226  Gross per 24 hour  Intake      0 ml  Output    200 ml  Net   -200 ml    Last BM: 5/5 large and soft   Labs:  No results found for this basename: NA, K, CL, CO2, BUN, CREATININE, CALCIUM, MG, PHOS, GLUCOSE,  in the last 168 hours  CBG (last 3)   Recent Labs  01/13/13 0805 01/13/13 1145 01/13/13 1646  GLUCAP 108* 172* 118*    Scheduled Meds: . antiseptic oral rinse  15 mL Mouth Rinse QID  . baclofen  10 mg Per Tube BID  . chlorhexidine  15 mL Mouth Rinse BID  . feeding supplement (VITAL 1.5 CAL)  237 mL Per Tube Q4H while awake  . free water  200 mL Per Tube Q4H while awake  . lacosamide  50 mg Per Tube BID  . levETIRAcetam  1,500 mg Per Tube BID  . methylphenidate  15 mg Per Tube BID WC  .  multivitamin  5 mL Oral Daily  . pantoprazole sodium  40 mg Per Tube Daily  . phenytoin  100 mg Per Tube TID  . propranolol  80 mg Per Tube Q8H  . saccharomyces boulardii  250 mg Oral BID    Continuous Infusions: none    Albert Motto MS, RD, LDN Pager: [phone] After-hours pager: [phone]

## 2013-01-14 NOTE — Progress Notes (Signed)
Occupational Therapy Note  Patient Details  Name: Albert [name] MRN: [id number] Date of Birth: 01/29/1976 Today's Date: 01/14/2013  Time: 9-9:30am ( .) Pt seen for co-tx OT session this morning. Partial wash up completed at sink with pt standing from wheelchair x 3 with min-mod A + 2 for safety. Changed brief and washed perineal /buttock area with max A while pt in standing at sink. Attempted hand over hand to apply deodorant and donn shirt, without pt follow through. Pt was not oriented to place, date or time this morning asking if it was still night time. In gym, pt stood at raised table with +2 for safety to complete visual perceptual activity. When asked to retrieve cups from center or R side of table, pt unable to see cups. Was able to retrieve 1 cup with min A to L side. Pt reported fatigue and pain towards end of session, nursing made aware.  Joshawn Crissman Hessie Diener 01/14/2013, 12:45 PM

## 2013-01-14 NOTE — Progress Notes (Signed)
Physical Therapy Weekly Progress Note  Patient Details  Name: Albert [name] MRN: [id number] Date of Birth: 1976/06/19  Today's Date: 01/14/2013 Time: Session #1:  4098-1191, Session #2: [id number], session #3:  Time Calculation (min): Session #1: 25 min, Session #2: 55 min, session #3:  Patient has met 3 of 3 short term goals.    Patient continues to demonstrate the following deficits: Right leg flexion tone, decreased mobility, decreased attention, decreased awareness, decreased strength, balance, decreased coordination of bil legs R>L and therefore will continue to benefit from skilled PT intervention to enhance overall performance with activity tolerance, balance, ability to compensate for deficits, attention, awareness and coordination.  Patient progressing toward long term goals..  Continue plan of care.  PT Short Term Goals Week 2:  PT Short Term Goal 1 (Week 2): Patient will be able to perform bed mobility with Max-Assist. PT Short Term Goal 1 - Progress (Week 2): Met PT Short Term Goal 2 (Week 2): Patient will be able to perform transfers with Max-Assist. PT Short Term Goal 2 - Progress (Week 2): Met PT Short Term Goal 3 (Week 2): Patient will be able to sit EOB x 5 minutes with Mod-Assist PT Short Term Goal 3 - Progress (Week 2): Met  Skilled Therapeutic Interventions/Progress Updates:    Session #1: Co-tx with OT.  This session focused on bed mobility, standing with steady and gait (three person assist) with eva walker in minimally distracting day room.  Bed mobility supervision to go supine to sit.  Mod assist to weight shift to scoot EOB.  Transfer to left from bed to [facility] (level transfer) max assist one person.  Sit to stand transitions x 3 in day room with steady mod assist for foot placement and transition to stand, pt able to lower to sit with bil hands supported on steady without external assist from therapist.  He got down to min assist to facilitate standing.  Second therapist  (OT) joined session for gait with EVA walker.  Third person recruited to help steer Carley Hammed walker while one therapist controlled trunk and hips, and one helped coordinate right foot placement.  Pt with less flexor tone today than yesterday during gait.  Pt leaning to the right during gait, but not forced back over to left by therapist to try to reduce the amount of pushing right.  Mod to max multi person assist during gait due to increased help needed when pt fatigues.   Session #2: Co- session with rec therapy: This session focused on Sit to stand transitions in the steady mod assist due to fatigue (he has had two hours of therapy in a row).  Standing working on weight shifting by reaching with his hands "high five" bil.  Left better than right due to visual/perceptual deficits.  Due to fatigue second part of session worked on cognitive and Hydrologist albums of family vacations in pt's room.  Identifying people in pictures and trying to remember events in his life.  Pt's parents present and mother helping with photo albums, getting tearful as pt struggled.   Session #3: This session pt started agitated.  I believe his mother and his fatigue level have this effect on him.  I removed him from where he was visiting with his mother and we worked in what started out as a quiet gym area.  Transfer WC to mat table max assist for sit to stand, mod assist to step around to the left and max assist to  help lower controlling his descent.  In sitting we worked on sitting balance and upper extremity coordination by throwing a green exercise ball.  Verbal and manual assist needed to get both hands (right especially) wrapped around the ball.  Sister in room helping with throwing activity.  Sister has a calming effect on the pt, so we agreed to have her participate.  Next, we used a plastic bat and with hand over hand assist had pt grasp and attempt to swing the bat at a large yellow exercise ball.  Returned to  room at end of session and with 3 person assist had pt stand at sink and changed his brief with RN tech performing peri care and pants management.  Pt did much better with static standing with bil upper extremity support.    Therapy Documentation Precautions:  Precautions Precautions: Fall Precaution Comments: L crani, skull cap removed  and in abdomen Required Braces or Orthoses: Other Brace/Splint Other Brace/Splint: abdominal binder Restrictions Weight Bearing Restrictions: No    Pain: Pain Assessment Pain Assessment: No/denies pain   Locomotion : Ambulation Ambulation/Gait Assistance: 1: +2 Total assist     See FIM for current functional status  Therapy/Group: Co-Treatment  Chael Urenda B. Destynee Stringfellow, PT, DPT [phone]   01/14/2013, 12:11 PM

## 2013-01-15 ENCOUNTER — Inpatient Hospital Stay (HOSPITAL_COMMUNITY): Payer: BC Managed Care – PPO | Admitting: Speech Pathology

## 2013-01-15 ENCOUNTER — Inpatient Hospital Stay (HOSPITAL_COMMUNITY): Payer: BC Managed Care – PPO | Admitting: Physical Therapy

## 2013-01-15 ENCOUNTER — Inpatient Hospital Stay (HOSPITAL_COMMUNITY): Payer: BC Managed Care – PPO | Admitting: *Deleted

## 2013-01-15 ENCOUNTER — Inpatient Hospital Stay (HOSPITAL_COMMUNITY): Payer: BC Managed Care – PPO

## 2013-01-15 MED ORDER — BOOST / RESOURCE BREEZE PO LIQD
1.0000 | ORAL | Status: DC
  Administered 2013-01-17 – 2013-02-06 (×15): 1 via ORAL
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
## 2013-01-15 NOTE — Progress Notes (Signed)
Patient ID: Albert [name], male   DOB: [birth year], 37 y.o.   MRN: [id number] Subjective/Complaints: No new issues. Quiet evening. A 12 point review of systems has been performed and if not noted above is otherwise negative.    Objective: Vital Signs: Blood pressure 134/80, pulse 64, temperature 98.3 F (36.8 C), temperature source Oral, resp. rate 18, height 6' (1.829 m), weight 81.5 kg (179 lb 10.8 oz), SpO2 99.00%. No results found. No results found for this basename: WBC, HGB, HCT, PLT,  in the last 72 hours No results found for this basename: NA, K, CL, CO, GLUCOSE, BUN, CREATININE, CALCIUM,  in the last 72 hours CBG (last 3)   Recent Labs  01/13/13 1958 01/14/13 0008 01/14/13 0409  GLUCAP 85 138* 94    Wt Readings from Last 3 Encounters:  01/09/13 81.5 kg (179 lb 10.8 oz)  01/03/13 83.3 kg (183 lb 10.3 oz)  01/03/13 83.3 kg (183 lb 10.3 oz)    Physical Exam:  Eyes:  Pupils reactive to light  Neck: Neck supple. No thyromegaly present.  Cardiovascular: Normal rate and regular rhythm.  Pulmonary/Chest: Effort normal and breath sounds normal. No respiratory distress.  Abdominal: Soft. Bowel sounds are normal. He exhibits no distension.  Neurological:  Sleeping arouses to physical Right upper flexor synergy and right lower extremity with extensor posturing. RUE  Tone still is trace to 1. RUE is grossly 3-4/5. RLE is 3-4 prox to 0/5 at ankle. Senses pain on right. DTR's 3+  Skin:  Craniotomy site without drainage.   Peg and donor sites on abdomen are intact with only minimal drainage   Assessment/Plan: 1. 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 Physiatrist is providing close team supervision and 24 hour management of active medical problems listed below. Physiatrist and rehab team continue to assess barriers to discharge/monitor  patient progress toward functional and medical goals. FIM: FIM - Bathing Bathing Steps Patient Completed: Chest;Right Arm Bathing: 1: Total-Patient completes 0-2 of 10 parts or less than 25%  FIM - Upper Body Dressing/Undressing Upper body dressing/undressing: 1: Total-Patient completed less than 25% of tasks FIM - Lower Body Dressing/Undressing Lower body dressing/undressing: 0: Activity did not occur  FIM - Toileting Toileting: 0: Activity did not occur  FIM - Archivist Transfers: 0-Activity did not occur  FIM - Games developer Transfer: 1: Two helpers  FIM - Locomotion: Wheelchair Locomotion: Wheelchair: 1: Total Assistance/staff pushes wheelchair (Pt<25%) FIM - Locomotion: Ambulation Locomotion: Ambulation Assistive Devices: Interior and spatial designer Ambulation/Gait Assistance: 1: +2 Total assist Locomotion: Ambulation: 1: Two helpers  Comprehension Comprehension Mode: Auditory Comprehension: 1-Understands basic less than 25% of the time/requires cueing 75% of the time  Expression Expression Mode: Verbal Expression Assistive Devices: 6-Other (Comment) (trach/stoma site) Expression: 1-Expresses basis less than 25% of the time/requires cueing greater than 75% of the time.  Social Interaction Social Interaction Mode: Asleep Social Interaction: 2-Interacts appropriately 25 - 49% of time - Needs frequent redirection.  Problem Solving Problem Solving: 1-Solves basic less than 25% of the time - needs direction nearly all the time or does not effectively solve problems and may need a restraint for safety  Memory Memory: 1-Recognizes or recalls less than 25% of the time/requires cueing greater than 75% of the time  Medical Problem List and Plan:  1. 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 Status  post craniotomy evacuation hematoma with insertion of bone flap abdominal left upper quadrant 12/15/2012 with revision after  midline shift 12/28/2012 ---RLAS V at this point 2. DVT Prophylaxis/Anticoagulation: SCDs. Dopplers negative 3. Numerous facial fractures. Conservative care per ENT  4. Mood: Ativan as needed, Inderal 80 mg every 8 hours.  -re-establish sleep cycle/ sleep chart.  -ritalin- continue for attention -enclosure bed still required 5. Neuropsych: This patient is not capable of making decisions on his own behalf.  6. Seizure disorder. Vimpat--taper to 50mg  bid--dc today continue Keppra, Dilantin.    7. Dysphagia/VDRF. Gastrostomy PEG tube/trach tube 12/31/2012 per trauma services.  -decannulated without difficulty--occlusive dressing--stoma needs to be packed  -diet per SLP 8. Alcohol abuse. Counseling  9. Wound care: local care to PEG, crani, and abdominal sites.  -local care--remove staples as "possible" -helmet at some point 10. Temp: afebrile, stable leukocytosis -urine culture and cxr negative, dopplers negative. 11. Spasticity:   low dose baclofen  -continued therapy, engagement of right side     LOS (Days) 12 A FACE TO FACE EVALUATION WAS PERFORMED  Rocky Rishel T 01/15/2013 7:36 AM

## 2013-01-15 NOTE — Progress Notes (Signed)
Recreational Therapy Session Note  Patient Details  Name: Albert [name] MRN: [id number] Date of Birth: 11-18-1975 Today's Date: 01/15/2013 Time:  10-11 Pain: no c/o pain Skilled Therapeutic Interventions/Progress Updates: Session focused on dynamic standing balance, functional use of RUE, sustained & selective attention, & visual scanning during horse shoe & putting activity.  [name] observing session.  Pt required mod assist for standing activities and then min assist for seated putting activity, mod cues throughout for instruction & due to decreased vision.   Therapy/Group: Co-Treatment   Talulah Schirmer 01/15/2013, 12:01 PM
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
## 2013-01-15 NOTE — Progress Notes (Signed)
Physical Therapy Note  Patient Details  Name: MASUD HOLUB MRN: [id number] Date of Birth: 08/30/1976 Today's Date: 01/15/2013  Time 1: 800-900 60 minutes  1:1 No signs/symptoms of pain.  Pt rec'd in bed with soaked with urine.  Focused on seated and standing and focused attention for bathing and dressing tasks.  Pt able to stand with mod A due to R LOB, able to sit to stand with mod A for balance. Pt able to initiate all stages of bathing and dressing, requires hand over hand assist for washing, grooming and dressing tasks.  Pt attention 10-20 seconds to these functional tasks.  Gait training with +3 assist with EVA walker.  Pt with improved wt shifting to the L, requires max A to prevent R LE adduction, manual facilitation for posture.  Reduced tone in R LE today vs yesterday with gait.  Time 2: 1000-1055 55 minutes co-tx with TR  1:1 No signs/symptoms of pain.  Standing balance/reaching activity with horseshoe toss with pt able to stand with mod A without UE support, focused on wt shifting L and hand/eye coordination with L UE in R and L visual fields.  Pt able to wt shift and reach with mod A.  Much improved coordination in L UE.  Pt requires increased visual cuing when reaching to R.  Pt able to bend and toss horseshoes with mod A.  Attempt standing for golf game.  Pt with increased flexion tone in R LE with standing and pt pushing with L LE.  Pt aware of loss of balance, states it feels "weird" to stand.  Good awareness of poor balance.  ? Increasing tone and pushing when fatigued.  Pt able to play golf putting game sitting with improved use of B UEs and mod cuing to locate objects due to visual deficits.   Gabrella Stroh 01/15/2013, 8:57 AM

## 2013-01-15 NOTE — Progress Notes (Signed)
Occupational Therapy Session Note  Patient Details  Name: Albert [name] MRN: [id number] Date of Birth: Jan 05, 1976  Today's Date: 01/15/2013 Time: 0930-1000 Time Calculation (min): 30 min  Short Term Goals: Week 1:  OT Short Term Goal 1 (Week 1): Bed Mobility, right/left with max assist X1 OT Short Term Goal 2 (Week 1): Demo focused attention during ADL for 15 seconds OT Short Term Goal 3 (Week 1): Perform 1 seated grooming activity with moderate assist OT Short Term Goal 4 (Week 1): Dress upper body with max assist X1  Skilled Therapeutic Interventions/Progress Updates:     Cotx with COTA.  Pt already bathed and dressed upon arrival.  Pt transitioned to gym for sitting and standing activities with focus on increased functional RUE use and reaching to right of midline.  Pt exhibited overall speech intelligibility approx 50% of time throughout session especially when attempting to relate a more complicated thought.  Pt responded to 1 step command 100% of time.  Pt continues to exhibit increased tone in RUE biceps and pectoralis and adducts at shoulder when reaching for items.  When looking for item in left right field of vision it appears that patient is focusing on objects further away from focal object.  Pt turns head to right when spoken to and focuses on therapist but continues to have difficulty locating object in right field of vision.  Pt grasped large ball with BUE and attempted to toss ball back to therapist while seated.  Pt exhibited increased difficulty attempting this task while standing.  Therapy Documentation Precautions:  Precautions Precautions: Fall Precaution Comments: L crani, skull cap removed  and in abdomen Required Braces or Orthoses: Other Brace/Splint Other Brace/Splint: abdominal binder Restrictions Weight Bearing Restrictions: No General:   Vital Signs:   Pain: Pain Assessment Pain Assessment: No/denies pain ADL:   Exercises:   Other Treatments:    See  FIM for current functional status  Therapy/Group: Co-treatment  [name], [name] 01/15/2013, 10:04 AM

## 2013-01-15 NOTE — Progress Notes (Signed)
Occupational Therapy Note  Patient Details  Name: Albert [name] MRN: [id number] Date of Birth: 1976-01-20 Today's Date: 01/15/2013  Time 9-9:30am ( ) Pt seen for co-tx session with COTA focusing on attention to task, visual-perceptual tasks, use of RUE and activity tolerance. Pt at nurses station upon arrival, agreeable to go to gym. Once in gym, pt sat in wheelchair reaching for objects in different planes. Pt seems able to retrieve items that are midline and in L visual field (with LUE) and has more difficulty finding and retrieving items off to R. Pt used bil UE's to retrieve different sized sports balls in front of him while in sitting and standing. With one therapist focused on standing balance, other therapist held up different object for pt to reach for. Pt required max A during standing and appearing to have increased frustration/anxiety while in standing.  Pt did not report any pain during session today.    Albert [name] 01/15/2013, 12:31 PM

## 2013-01-15 NOTE — Progress Notes (Signed)
Speech Language Pathology Daily Session Note  Patient Details  Name: VONTAE COURT MRN: [id number] Date of Birth: 1976-02-24  Today's Date: 01/15/2013 Time: 4098-1191 Time Calculation (min): 45 min  Short Term Goals: Week 2: SLP Short Term Goal 1 (Week 2): Patient will demonstrate sustained attention for 1 minute with max A verbal and tactile cues.   SLP Short Term Goal 2 (Week 2): Patient will follow one-step commands with max A verbal and tactile cues.  SLP Short Term Goal 3 (Week 2): Patient will answer yes/no questions with mod A verbal cues.  SLP Short Term Goal 4 (Week 2): Patient will consume Dys.1 textures and thin liquids with no overt s/s of aspiration for 3 consecutive days with max assist clinician assist.   Skilled Therapeutic Interventions: Skilled treatment session focused on cognitive recovery with focus on sustained attention to basic, familiar tasks. SLP facilitated session with max verbal cueing for dressing).  SLP also facilitated session with lunch of Dys.1 textures and thin liquids via cup with hand over hand assist which progressed to total assist due to attention and fatigue.  Patient consumed large cup sips of thin liquids with delayed cough in 30% of trials.  Patient demonstrated hard effortful coughs which appeared to reduce suspected penetrates.  Patient consumed 30% of meal and 12oz of thin liquids.   SLP educated Nurse Tech regarding safe swallow precautions and how to assist with dinner this p.m.  Continue with current plan of care.    FIM:  Comprehension Comprehension Mode: Auditory Comprehension: 1-Understands basic less than 25% of the time/requires cueing 75% of the time Expression Expression Mode: Verbal Expression: 1-Expresses basis less than 25% of the time/requires cueing greater than 75% of the time. Social Interaction Social Interaction: 2-Interacts appropriately 25 - 49% of time - Needs frequent redirection. Problem Solving Problem Solving: 1-Solves  basic less than 25% of the time - needs direction nearly all the time or does not effectively solve problems and may need a restraint for safety Memory Memory: 1-Recognizes or recalls less than 25% of the time/requires cueing greater than 75% of the time FIM - Eating Eating Activity: 2: Hand over hand assist  Pain Pain Assessment Pain Assessment: No/denies pain  Therapy/Group: Individual Therapy  Charlane Ferretti., CCC-SLP [phone]  Tishanna Dunford 01/15/2013, 4:49 PM

## 2013-01-15 NOTE — Progress Notes (Signed)
NUTRITION FOLLOW UP  DOCUMENTATION CODES  Per approved criteria   -Severe malnutrition in the context of acute injury   Intervention:   1. Continue 300 ml of Vital 1.5 via tube six times daily. Infuse bolus slowly. This regimen provides: 2700 kcal, 122 grams protein, and 1375 ml free water. 2. Continue fluid flushes of 200 ml six times daily, provide 100 ml water before and 100 ml after each bolus. This provides an additional 1200 ml free water daily. 3. Diet initiation per SLP. Will adjust TF regimen accordingly. 4. RD to add Resource Breeze po daily, each supplement provides 250 kcal and 9 grams of protein. 5. RD to continue to assess enteral tolerance  Nutrition Dx:   Inadequate oral intake related to inability to eat as evidenced by NPO status. Ongoing.  Goal:   Pt to meet >/= 90% of their estimated nutrition needs. Met.  Monitor:   TF tolerance, weight, labs, PO intake  Assessment:   Pt admitted after fall, positive for ETOH. Has SDH and SAH, multiple facial fractures, s/p decompressive craniectomy of L SDH. Pt with questionable cardiac arrest with CPR. Pt extubated 4/14, developed seizures and required second craniectomy. Pt had trach and PEG 4/22 and extubated on 4/24 and remains on trach collar. Pt has had persistent fevers this admission due to neuro storming.   Dysphagia 1 diet initiated this morning, also ordered for thin liquids, no straws and meds via alternative means. Discussed oral intake with RN - she states that he refused to eat lunch however he did drink a [employer] - RD to order.  Continues with order for veil bed.  Receiving 300 ml of Vital 1.5 via tube six times daily. This regimen provides: 2700 kcal, 122 grams protein, and 1375 ml free water. RN reports pt is tolerating goal TF regimen well at this time.   Height: Ht Readings from Last 1 Encounters:  01/03/13 6' (1.829 m)    Weight Status:   Wt Readings from Last 1 Encounters:  01/09/13 179 lb 10.8  oz (81.5 kg)  Wt variable. Wife at bedside reports usual weight PTA was around 200 - 205 lb. This is a weight loss of 16.5 % x 1 month.  Pt meets criteria for severe MALNUTRITION in the context of acute injury as evidenced by 16.5% wt loss x 1 month and moderate muscle wasting.  Re-estimated needs:  Kcal: 2500 - 2800 Protein: at least 120 grams protein Fluid: 2.5 - 2.8 liters daily  Skin: multiple incisions  Diet Order: Dysphagia 1; thins   Intake/Output Summary (Last 24 hours) at 01/15/13 1344 Last data filed at 01/14/13 2155  Gross per 24 hour  Intake    460 ml  Output      0 ml  Net    460 ml    Last BM: 5/6 large and loose   Labs:  No results found for this basename: NA, K, CL, CO2, BUN, CREATININE, CALCIUM, MG, PHOS, GLUCOSE,  in the last 168 hours  CBG (last 3)   Recent Labs  01/13/13 1958 01/14/13 0008 01/14/13 0409  GLUCAP 85 138* 94    Scheduled Meds: . antiseptic oral rinse  15 mL Mouth Rinse QID  . baclofen  10 mg Per Tube BID  . chlorhexidine  15 mL Mouth Rinse BID  . feeding supplement (VITAL 1.5 CAL)  237 mL Per Tube Q4H while awake  . free water  200 mL Per Tube Q4H while awake  . lacosamide  50 mg Per Tube BID  . levETIRAcetam  1,500 mg Per Tube BID  . methylphenidate  15 mg Per Tube BID WC  . multivitamin  5 mL Oral Daily  . pantoprazole sodium  40 mg Per Tube Daily  . phenytoin  100 mg Per Tube TID  . propranolol  80 mg Per Tube Q8H  . saccharomyces boulardii  250 mg Oral BID    Continuous Infusions: none    Albert Motto MS, RD, LDN Pager: [phone] After-hours pager: [phone]

## 2013-01-15 NOTE — Progress Notes (Signed)
Physical Therapy Session Note  Patient Details  Name: CAPTAIN BLUCHER MRN: [id number] Date of Birth: [birth year]  Today's Date: 01/15/2013 Time: 5284-1324 Time Calculation (min): 41 min  Short Term Goals: Week 3:  PT Short Term Goal 1 (Week 3): Pt will perform bed mobility with supervision for supine to sit and min assist of one leg for sit to supine.   PT Short Term Goal 2 (Week 3): Transfers bed to [facility] on left mod assist.  Transfers WC to bed on right max assist PT Short Term Goal 3 (Week 3): Gait with LRAD >60' mod two person assist.   PT Short Term Goal 4 (Week 3): Pt will attend to singular task in minimally distracting envornment for > 5 mins with min cues to redirect.   PT Short Term Goal 5 (Week 3): Pt will perform static standing activity for > 5 mins with min assist for balance with upper extremity support.    Skilled Therapeutic Interventions/Progress Updates:    This session focused on transfers, bed mobility, and balance in sitting and standing.  Pt transferred WC to mat table to his right max assist due to difficulty unweighting and moving his right foot.  Sitting on a balance disc we worked on dynamic sitting balance, visual/perceptual issues and right hand coordination by throwing a smaller red exercise ball back and forth.  Pt at times needed min assist for trunk balance and min assist for right hand placement on ball.  Pt needed verbal or auditory cues to anticipate catching the ball.  We then transitioned to standing with one person mod assist and reaching for the ball forward and to both sides reaching outside of BOS.  We then progressed to catching and bouncing the ball with mod assist for dynamic balance.  Even though fatigued, pt was less agitated today than yesterday afternoon.  Transferring mat table to WC and WC to bed towards his left mod assist.  Sit to supine min assist of right leg.    Therapy Documentation Precautions:  Precautions Precautions: Fall Precaution  Comments: L crani, skull cap removed  and in abdomen Required Braces or Orthoses: Other Brace/Splint Other Brace/Splint: abdominal binder Restrictions Weight Bearing Restrictions: No  See FIM for current functional status  Therapy/Group: Individual Therapy  Lurena Joiner B. Ernan Runkles, PT, DPT [phone]   01/15/2013, 3:24 PM

## 2013-01-16 ENCOUNTER — Inpatient Hospital Stay (HOSPITAL_COMMUNITY): Payer: BC Managed Care – PPO | Admitting: Physical Therapy

## 2013-01-16 ENCOUNTER — Inpatient Hospital Stay (HOSPITAL_COMMUNITY): Payer: BC Managed Care – PPO | Admitting: *Deleted

## 2013-01-16 ENCOUNTER — Inpatient Hospital Stay (HOSPITAL_COMMUNITY): Payer: BC Managed Care – PPO | Admitting: Speech Pathology

## 2013-01-16 ENCOUNTER — Inpatient Hospital Stay (HOSPITAL_COMMUNITY): Payer: BC Managed Care – PPO | Admitting: Occupational Therapy

## 2013-01-16 MED ORDER — PRO-STAT SUGAR FREE PO LIQD
30.0000 mL | Freq: Three times a day (TID) | ORAL | Status: DC
  Administered 2013-01-16 – 2013-01-22 (×19): 30 mL
  Filled 2013-01-16 (×25): qty 30

## 2013-01-16 MED ORDER — VITAL 1.5 CAL PO LIQD: 237.0000 mL | ORAL | Status: DC

## 2013-01-16 MED ORDER — VITAL 1.5 CAL PO LIQD
300.0000 mL | ORAL | Status: DC
  Administered 2013-01-16 – 2013-01-17 (×4): 237 mL
  Administered 2013-01-18 (×2): 300 mL
  Administered 2013-01-18: 237 mL
  Administered 2013-01-18 – 2013-01-21 (×8): 300 mL
  Filled 2013-01-16 (×30): qty 1000

## 2013-01-16 MED ORDER — FREE WATER
200.0000 mL | Status: DC
  Administered 2013-01-16 – 2013-01-23 (×29): 200 mL

## 2013-01-16 NOTE — Progress Notes (Signed)
Patient ID: Albert [name], male   DOB: 02-01-1976, 37 y.o.   MRN: [id number] Subjective/Complaints: Started on diet. No problems over night. Comfortable this am A 12 point review of systems has been performed and if not noted above is otherwise negative.    Objective: Vital Signs: Blood pressure 130/99, pulse 68, temperature 98.8 F (37.1 C), temperature source Oral, resp. rate 18, height 6' (1.829 m), weight 81.5 kg (179 lb 10.8 oz), SpO2 100.00%. No results found. No results found for this basename: WBC, HGB, HCT, PLT,  in the last 72 hours No results found for this basename: NA, K, CL, CO, GLUCOSE, BUN, CREATININE, CALCIUM,  in the last 72 hours CBG (last 3)   Recent Labs  01/13/13 1958 01/14/13 0008 01/14/13 0409  GLUCAP 85 138* 94    Wt Readings from Last 3 Encounters:  01/09/13 81.5 kg (179 lb 10.8 oz)  01/03/13 83.3 kg (183 lb 10.3 oz)  01/03/13 83.3 kg (183 lb 10.3 oz)    Physical Exam:  Eyes:  Pupils reactive to light  Neck: Neck supple. No thyromegaly present.  Cardiovascular: Normal rate and regular rhythm.  Pulmonary/Chest: Effort normal and breath sounds normal. No respiratory distress.  Abdominal: Soft. Bowel sounds are normal. He exhibits no distension.  Neurological:     RUE tone still is trace to 1. RUE is grossly 3-4/5. RLE is 3-4 prox to 0 to 1/5 at ankle. Senses pain on right. DTR's 3+. Answers simple questions. Limited insight and awareness. Makes good eye contact but attention only for a few seconds with me.  Skin:  Craniotomy site without drainage.   Peg and donor sites on abdomen are intact with only minimal drainage   Assessment/Plan: 1. 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 Physiatrist is providing close team supervision and 24 hour management of active medical problems listed below. Physiatrist and  rehab team continue to assess barriers to discharge/monitor patient progress toward functional and medical goals. FIM: FIM - Bathing Bathing Steps Patient Completed: Chest;Right Arm Bathing: 1: Total-Patient completes 0-2 of 10 parts or less than 25%  FIM - Upper Body Dressing/Undressing Upper body dressing/undressing: 1: Total-Patient completed less than 25% of tasks FIM - Lower Body Dressing/Undressing Lower body dressing/undressing: 0: Activity did not occur  FIM - Toileting Toileting: 0: Activity did not occur  FIM - Archivist Transfers: 0-Activity did not occur  FIM - Banker Devices: Arm rests Bed/Chair Transfer: 4: Sit > Supine: Min A (steadying pt. > 75%/lift 1 leg);2: Bed > Chair or W/C: Max A (lift and lower assist);2: Chair or W/C > Bed: Max A (lift and lower assist)  FIM - Locomotion: Wheelchair Locomotion: Wheelchair: 0: Activity did not occur FIM - Locomotion: Ambulation Locomotion: Ambulation Assistive Devices: Interior and spatial designer Ambulation/Gait Assistance: 1: +2 Total assist Locomotion: Ambulation: 1: Two helpers  Comprehension Comprehension Mode: Auditory Comprehension: 1-Understands basic less than 25% of the time/requires cueing 75% of the time  Expression Expression Mode: Verbal Expression Assistive Devices: 6-Other (Comment) (stoma site/dry dressing/healing) Expression: 1-Expresses basis less than 25% of the time/requires cueing greater than 75% of the time.  Social Interaction Social Interaction Mode: Asleep Social Interaction: 2-Interacts appropriately 25 - 49% of time - Needs frequent redirection.  Problem Solving Problem Solving: 1-Solves basic less than 25% of the time - needs direction nearly all the  time or does not effectively solve problems and may need a restraint for safety  Memory Memory: 1-Recognizes or recalls less than 25% of the time/requires cueing greater than 75% of the time  Medical  Problem List and Plan:  1. 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 Status post craniotomy evacuation hematoma with insertion of bone flap abdominal left upper quadrant 12/15/2012 with revision after midline shift 12/28/2012 ---RLAS V at this point 2. DVT Prophylaxis/Anticoagulation: SCDs. Dopplers negative 3. Numerous facial fractures. Conservative care per ENT  4. Mood: Ativan as needed, Inderal 80 mg every 8 hours.  -re-establish sleep cycle/ sleep chart.  -ritalin- continue for attention -enclosure bed still required 5. Neuropsych: This patient is not capable of making decisions on his own behalf.  6. Seizure disorder. Vimpat--taper to 50mg  bid--dc today continue Keppra, Dilantin.    7. Dysphagia/VDRF. Gastrostomy PEG tube/trach tube 12/31/2012 per trauma services.  -decannulated without difficulty--occlusive dressing--stoma needs to be packed  -diet D1  -continue on current TF at least for today 8. Alcohol abuse. Counseling  9. Wound care: local care to PEG, crani, and abdominal sites.  -local care-- -helmet at some point 10. Temp: afebrile, stable leukocytosis -urine culture and cxr negative, dopplers negative. 11. Spasticity:   low dose baclofen  -continued therapy, engagement of right side--improving     LOS (Days) 13 A FACE TO FACE EVALUATION WAS PERFORMED  SWARTZ,ZACHARY T 01/16/2013 7:41 AM

## 2013-01-16 NOTE — Progress Notes (Signed)
Occupational Therapy Session Note  Patient Details  Name: Albert [name] MRN: [id number] Date of Birth: 1976-06-11  Today's Date: 01/16/2013 Time: 0800-0900 Time Calculation (min): 60 min  Short Term Goals: Week 1:  OT Short Term Goal 1 (Week 1): Bed Mobility, right/left with max assist X1 OT Short Term Goal 2 (Week 1): Demo focused attention during ADL for 15 seconds OT Short Term Goal 3 (Week 1): Perform 1 seated grooming activity with moderate assist OT Short Term Goal 4 (Week 1): Dress upper body with max assist X1  Skilled Therapeutic Interventions/Progress Updates:    Pt in bed upon arrival but awake.  Pt denied any pain.  Pt sat EOB on command with no assistance.  Pt performed stand pivot transfer to w/c to sit at sink for dressing tasks.  Pt incontinent of bladder and required +2 assistance to clean peri area and don clean brief.  Pt was able to stand at sink with steady A but required the +2 to don brief.  Pt threaded LUE into shirt sleeve and pulled over head without assistance.  Pt had placed shirt on backwards and therapist assisted with doffing shirt.  Pt required tot A to don shirt the second time.  Pt was tot A for donning shorts but was able to lift BLE on command.  Pt stood at sink with steady assist and pulled up pants.  Pt donned left sock without assistance but required assistance to don right sock and shoes.  Pt stood at sink to brush teeth.  Pt performed sit->stand with supervision.  Pt initially attempted to brush teeth with RUE.  While engaged brushing teeth pt increasingly required increased assistance with standing.  When patient used LUE to brush teeth pt remained standing at sink with steady A only.  Pt transitioned to ambulation in hallway with tot A + 2 (with additional staff to manage [facility] walker).  Therapy Documentation Precautions:  Precautions Precautions: Fall Precaution Comments: L crani, skull cap removed  and in abdomen Required Braces or Orthoses: Other  Brace/Splint Other Brace/Splint: abdominal binder Restrictions Weight Bearing Restrictions: No  See FIM for current functional status  Therapy/Group: Individual Therapy  Jhalil, Silvera 01/16/2013, 3:15 PM

## 2013-01-16 NOTE — Progress Notes (Signed)
Social Work Patient ID: [name], male   DOB: 07-20-1976, 37 y.o.   MRN: [id number]  Met with pt's wife, parents and sister yesterday to review team conference.  Good questions from all.  Family reports they feel they have witnessed good gains this week and are pleased.  Mother and sister tearful at times as they both note, "We're just not going to settle for anything less than a great recovery."  Wife discussing possiblity of starting to bring their 40 yr old daughter to visit.  We discussed ways to make this as positive experience as possible.  Provided wife with a child's book addressing topic of brain -injured parent as well as a game.  Daughter in this morning and it appeared to be a good first visit.  Will follow up with wife.  Daughter asking, "Are we gonna read the book tonight?".  Will continue to follow for support and d/c planning.  Jonique Kulig, LCSW

## 2013-01-16 NOTE — Progress Notes (Addendum)
NUTRITION FOLLOW UP  DOCUMENTATION CODES  Per approved criteria   -Severe malnutrition in the context of acute injury   Intervention:    Change bolus feedings to:   300 ml of Vital 1.5 via tube 0600 and 2200  300 ml of Vital 1.5 via tube at 0800, 1300, and 1800 ONLY if pt consumes <50% of meals.  Total regimen will provide: 2250 kcal, 102 grams protein, and 1146 ml free water.  Add 30 ml Prostat via tube TID, each supplement provides 100 kcal and 15 grams protein.  Continue fluid flushes of 200 ml six times daily, provide 100 ml water before and 100 ml after each bolus. This provides an additional 1200 ml free water daily.  Continue Resource Breeze po daily, each supplement provides 250 kcal and 9 grams of protein.  RD to continue to assess enteral tolerance  Nutrition Dx:   Inadequate oral intake related to inability to eat as evidenced by NPO status. Ongoing.  Goal:   Pt to meet >/= 90% of their estimated nutrition needs. Met.  Monitor:   TF tolerance, weight, labs, PO intake  Assessment:   Pt admitted after fall, positive for ETOH. Has SDH and SAH, multiple facial fractures, s/p decompressive craniectomy of L SDH. Pt with questionable cardiac arrest with CPR. Pt extubated 4/14, developed seizures and required second craniectomy. Pt had trach and PEG 4/22 and extubated on 4/24 and remains on trach collar. Pt has had persistent fevers this admission due to neuro storming.   Dysphagia 1 diet initiated 5/7, also ordered for thin liquids, no straws and meds via alternative means. Discussed oral intake with RN - she states that pt ate at least 50% of lunch.  Continues with order for veil bed.  Receiving 300 ml of Vital 1.5 via tube six times daily. This regimen provides: 2700 kcal, 122 grams protein, and 1375 ml free water. RN reports pt is tolerating goal TF regimen well at this time. PA ready to change to prn boluses to improve oral intake.    Height: Ht Readings from Last  1 Encounters:  01/03/13 6' (1.829 m)    Weight Status:   Wt Readings from Last 1 Encounters:  01/09/13 179 lb 10.8 oz (81.5 kg)  Wt variable. Wife at bedside reports usual weight PTA was around 200 - 205 lb. This is a weight loss of 16.5 % x 1 month.  Pt meets criteria for severe MALNUTRITION in the context of acute injury as evidenced by 16.5% wt loss x 1 month and moderate muscle wasting.  Re-estimated needs:  Kcal: 2500 - 2800 Protein: at least 120 grams protein Fluid: 2.5 - 2.8 liters daily  Skin: multiple incisions  Diet Order: Dysphagia 1; thins   Intake/Output Summary (Last 24 hours) at 01/16/13 1459 Last data filed at 01/15/13 1800  Gross per 24 hour  Intake    120 ml  Output      0 ml  Net    120 ml    Last BM: 5/7 large and loose   Labs:  No results found for this basename: NA, K, CL, CO2, BUN, CREATININE, CALCIUM, MG, PHOS, GLUCOSE,  in the last 168 hours  CBG (last 3)   Recent Labs  01/13/13 1958 01/14/13 0008 01/14/13 0409  GLUCAP 85 138* 94    Scheduled Meds: . antiseptic oral rinse  15 mL Mouth Rinse QID  . baclofen  10 mg Per Tube BID  . chlorhexidine  15 mL Mouth Rinse BID  .  feeding supplement  1 Container Oral Q24H  . feeding supplement (VITAL 1.5 CAL)  237 mL Per Tube Q4H while awake  . free water  200 mL Per Tube Q4H while awake  . levETIRAcetam  1,500 mg Per Tube BID  . methylphenidate  15 mg Per Tube BID WC  . multivitamin  5 mL Oral Daily  . pantoprazole sodium  40 mg Per Tube Daily  . phenytoin  100 mg Per Tube TID  . propranolol  80 mg Per Tube Q8H  . saccharomyces boulardii  250 mg Oral BID    Continuous Infusions: none    Jarold Motto MS, RD, LDN Pager: [phone] After-hours pager: [phone]

## 2013-01-16 NOTE — Progress Notes (Signed)
Speech Language Pathology Daily Session Note  Patient Details  Name: KELSO BIBBY MRN: [id number] Date of Birth: [birth year]  Today's Date: 01/16/2013 Session 1 Time: 4098-1191 Time Calculation (min): 30 min  Session 2 Time: 1310-1400 Time Calculation (min): 50 min  Short Term Goals: Week 2: SLP Short Term Goal 1 (Week 2): Patient will demonstrate sustained attention for 1 minute with max A verbal and tactile cues.   SLP Short Term Goal 2 (Week 2): Patient will follow one-step commands with max A verbal and tactile cues.  SLP Short Term Goal 3 (Week 2): Patient will answer yes/no questions with mod A verbal cues.  SLP Short Term Goal 4 (Week 2): Patient will consume Dys.1 textures and thin liquids with no overt s/s of aspiration for 3 consecutive days with max assist clinician assist.   Skilled Therapeutic Interventions: Session 1 Skilled treatment session focused on cognitive recovery with focus on sustained attention to basic, familiar tasks. SLP attempted to facilitated session with breakfast; however, patient refused.  Session then focused on addressing orientation and awareness with patient requesting answers to appropriate questions about his injury.     Session 2 Skilled treatment session focused on cognitive recovery with focus on sustained attention to basic, familiar tasks. SLP facilitated session with lunch of Dys.1 textures and thin liquids via cup with hand over hand assist.  Patient declined after a few bites; mother present for session and provided encouragement and hand over hand assist with initial cues from therapist.  Patient consumed large cup sips of thin liquids with delayed cough x3 during meal.  Patient demonstrated hard effortful coughs which appeared to reduce suspected penetrates.  Patient consumed 50% of meal and 16oz of thin liquids.   Continue with current plan of care.  FIM:  Comprehension Comprehension Mode: Auditory Comprehension: 1-Understands basic less  than 25% of the time/requires cueing 75% of the time Expression Expression Mode: Verbal Expression: 2-Expresses basic 25 - 49% of the time/requires cueing 50 - 75% of the time. Uses single words/gestures. Social Interaction Social Interaction: 3-Interacts appropriately 50 - 74% of the time - May be physically or verbally inappropriate. Problem Solving Problem Solving: 1-Solves basic less than 25% of the time - needs direction nearly all the time or does not effectively solve problems and may need a restraint for safety Memory Memory: 1-Recognizes or recalls less than 25% of the time/requires cueing greater than 75% of the time FIM - Eating Eating Activity: 2: Hand over hand assist  Pain Pain Assessment Pain Assessment: No/denies pain  Therapy/Group: Individual Therapy x2  Charlane Ferretti., CCC-SLP [phone]  Tremaine Earwood 01/16/2013, 5:15 PM

## 2013-01-16 NOTE — Progress Notes (Signed)
Physical Therapy Note  Patient Details  Name: ARLEN DUPUIS MRN: [id number] Date of Birth: [birth year] Today's Date: 01/16/2013  Time 1: [phone] 50 minutes  1:1 No c/o pain.  Pt able to verbalize fatigue and need to rest, pt agreeable to treatment with encouragement, redirection and choices of treatment.  Pt also responded well to being able to control the number of repetitions of activities.  Standing horseshoe toss with pt able to stand statically with reaching out of BOS and squatting with min A, initial mod A to find balance, then min A 2 x 5 minutes for task.  Gait training with EVA walker and +2 assist.  Pt requires assist for forward advancement and control of EVA walker, assist to prevent R LE adduction due to increased tone, manual facilitation at hips for wt shifting and cadence.  Pt with much improved sequencing with gait repetitions.  Able to gait 65' x 3.  Pt continues with increased tone R LE when fatigued.   Time 2: 1400-1500 60 minutes  1:1 No c/o pain.  Pt transferred to standard w/c with mod A for stand pivot to L.  Pt able to propel with L LE with initial tactile cuing, progressing to supervision.  Pt with little carry over throughout session.  Required tactile cues each time to initiate w/c propulsion.  Standing balance, coordination and attention with baseball bat activity hitting large letter ball.  Pt able to make good contact and use B UEs.  Requires mod A for balance with this dynamic task.  Pt incontinent of bowel/bladder.  Mod A for toilet transfers and balance for hygiene, requires max A for clothing management.  Brenda Samano 01/16/2013, 11:20 AM

## 2013-01-17 ENCOUNTER — Inpatient Hospital Stay (HOSPITAL_COMMUNITY): Payer: BC Managed Care – PPO | Admitting: Physical Therapy

## 2013-01-17 ENCOUNTER — Inpatient Hospital Stay (HOSPITAL_COMMUNITY): Payer: BC Managed Care – PPO | Admitting: Speech Pathology

## 2013-01-17 ENCOUNTER — Inpatient Hospital Stay (HOSPITAL_COMMUNITY): Payer: BC Managed Care – PPO

## 2013-01-17 MED ORDER — LORAZEPAM 0.5 MG PO TABS
0.5000 mg | ORAL_TABLET | ORAL | Status: DC | PRN
  Administered 2013-01-25: 1 mg via ORAL
  Filled 2013-01-17: qty 2

## 2013-01-17 MED ORDER — LORAZEPAM 0.5 MG PO TABS: 0.5000 mg | ORAL_TABLET | ORAL | Status: DC | PRN

## 2013-01-17 NOTE — Progress Notes (Signed)
Occupational Therapy Session Note  Patient Details  Name: Albert [name] MRN: [id number] Date of Birth: [birth year]  Today's Date: 01/17/2013 Time: 0900-0958 Time Calculation (min): 58 min  Short Term Goals: Week 1:  OT Short Term Goal 1 (Week 1): Bed Mobility, right/left with max assist X1 OT Short Term Goal 2 (Week 1): Demo focused attention during ADL for 15 seconds OT Short Term Goal 3 (Week 1): Perform 1 seated grooming activity with moderate assist OT Short Term Goal 4 (Week 1): Dress upper body with max assist X1  Skilled Therapeutic Interventions/Progress Updates:    Pt seated in w/c upon arrival.  Pt engaged in grooming and dressing with sit<>stand from w/c. Pt performed sit<>stand with min A and min a for standing while brushing teeth with LUE.  Pt attempted to brush teeth with RUE but demonstrated increased difficulty standing when engaged in functional tasks.  Pt required assistance threading shirt but was able to place over head when pt received assistance locating head hole of shirt.  Pt demonstrated difficulty orienting pants to place BLE into pants legs and required assistance.  Pt pulled up pants while standing.  Pt transitioned to gym for activities requiring patient to locate items on right and placing them into container.  Pt continues to require max verbal cues to look to right to locate items and often is not able to locate item and when he does visually locate item is unable to accurately place his right hand on item to pick it up.  Therapy Documentation Precautions:  Precautions Precautions: Fall Precaution Comments: L crani, skull cap removed  and in abdomen Required Braces or Orthoses: Other Brace/Splint Other Brace/Splint: abdominal binder Restrictions Weight Bearing Restrictions: No   Pain: Pain Assessment Pain Assessment: No/denies pain  See FIM for current functional status  Therapy/Group: Individual Therapy  Axle, Parfait 01/17/2013, 10:00  AM

## 2013-01-17 NOTE — Progress Notes (Signed)
Patient ID: Albert [name], male   DOB: 08/13/1976, 37 y.o.   MRN: [id number] Subjective/Complaints: Voiding frequently. No complaints. Slow to arouse this am. A 12 point review of systems has been performed and if not noted above is otherwise negative.    Objective: Vital Signs: Blood pressure 124/79, pulse 89, temperature 98.4 F (36.9 C), temperature source Oral, resp. rate 18, height 6' (1.829 m), weight 81.5 kg (179 lb 10.8 oz), SpO2 97.00%. No results found. No results found for this basename: WBC, HGB, HCT, PLT,  in the last 72 hours No results found for this basename: NA, K, CL, CO, GLUCOSE, BUN, CREATININE, CALCIUM,  in the last 72 hours CBG (last 3)  No results found for this basename: GLUCAP,  in the last 72 hours  Wt Readings from Last 3 Encounters:  01/09/13 81.5 kg (179 lb 10.8 oz)  01/03/13 83.3 kg (183 lb 10.3 oz)  01/03/13 83.3 kg (183 lb 10.3 oz)    Physical Exam:  Eyes:  Pupils reactive to light  Neck: Neck supple. No thyromegaly present.  Cardiovascular: Normal rate and regular rhythm.  Pulmonary/Chest: Effort normal and breath sounds normal. No respiratory distress.  Abdominal: Soft. Bowel sounds are normal. He exhibits no distension.  Neurological:     RUE tone still is trace to 1. RUE is grossly 3-4/5. RLE is 3-4 prox to 0 to 1/5 at ankle. Senses pain on right. DTR's 3+. Answers simple questions. Limited insight and awareness. Makes good eye contact but attention only for a few seconds with me.  Skin:  Craniotomy site without drainage.   Peg and donor sites on abdomen are intact with only minimal drainage   Assessment/Plan: 1. 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 Physiatrist is providing close team supervision and 24 hour management of active medical problems listed below. Physiatrist and rehab team continue  to assess barriers to discharge/monitor patient progress toward functional and medical goals. FIM: FIM - Bathing Bathing Steps Patient Completed: Chest;Right Arm Bathing: 0: Activity did not occur  FIM - Upper Body Dressing/Undressing Upper body dressing/undressing steps patient completed: Thread/unthread left sleeve of pullover shirt/dress;Put head through opening of pull over shirt/dress Upper body dressing/undressing: 3: Mod-Patient completed 50-74% of tasks FIM - Lower Body Dressing/Undressing Lower body dressing/undressing steps patient completed: Don/Doff left sock Lower body dressing/undressing: 0: Activity did not occur  FIM - Toileting Toileting: 0: Activity did not occur  FIM - Archivist Transfers: 0-Activity did not occur  FIM - Banker Devices: Arm rests Bed/Chair Transfer: 4: Sit > Supine: Min A (steadying pt. > 75%/lift 1 leg);2: Bed > Chair or W/C: Max A (lift and lower assist);2: Chair or W/C > Bed: Max A (lift and lower assist)  FIM - Locomotion: Wheelchair Locomotion: Wheelchair: 0: Activity did not occur FIM - Locomotion: Ambulation Locomotion: Ambulation Assistive Devices: Interior and spatial designer Ambulation/Gait Assistance: 1: +2 Total assist Locomotion: Ambulation: 1: Two helpers  Comprehension Comprehension Mode: Auditory Comprehension: 1-Understands basic less than 25% of the time/requires cueing 75% of the time  Expression Expression Mode: Verbal Expression Assistive Devices: 6-Other (Comment) (stoma site/dry dressing/healing) Expression: 2-Expresses basic 25 - 49% of the time/requires cueing 50 - 75% of the time. Uses single words/gestures.  Social Interaction Social Interaction Mode: Asleep Social Interaction: 3-Interacts appropriately 50 - 74% of the time - May be physically  or verbally inappropriate.  Problem Solving Problem Solving: 1-Solves basic less than 25% of the time - needs direction nearly all  the time or does not effectively solve problems and may need a restraint for safety  Memory Memory: 1-Recognizes or recalls less than 25% of the time/requires cueing greater than 75% of the time  Medical Problem List and Plan:  1. 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 Status post craniotomy evacuation hematoma with insertion of bone flap abdominal left upper quadrant 12/15/2012 with revision after midline shift 12/28/2012 ---RLAS V at this point 2. DVT Prophylaxis/Anticoagulation: SCDs. Dopplers negative 3. Numerous facial fractures. Conservative care per ENT  4. Mood: Ativan as needed, Inderal 80 mg every 8 hours.  -re-establish sleep cycle/ sleep chart.  -ritalin- continue for attention -enclosure bed still required 5. Neuropsych: This patient is not capable of making decisions on his own behalf.  6. Seizure disorder. Vimpat--taper to 50mg  bid--dc today continue Keppra, Dilantin.    7. Dysphagia/VDRF. Gastrostomy PEG tube/trach tube 12/31/2012 per trauma services.  -decannulated without difficulty--occlusive dressing--stoma needs to be packed  -diet D1  -continue on current TF for now until po increases 8. Alcohol abuse. Counseling  9. Wound care: local care to PEG, crani, and abdominal sites.  -local care-- -helmet at some point 10. Temp: afebrile, stable leukocytosis -urine culture and cxr negative, dopplers negative. 11. Spasticity:   low dose baclofen  -continued therapy, engagement of right side--improving 12. Urinary frequency  -check urine analysis and culture  -likely central component     LOS (Days) 14 A FACE TO FACE EVALUATION WAS PERFORMED  Mitsuru Dault T 01/17/2013 7:56 AM

## 2013-01-17 NOTE — Progress Notes (Signed)
Physical Therapy Note  Patient Details  Name: Albert [name] MRN: [id number] Date of Birth: 1975/10/29 Today's Date: 01/17/2013  Time 1: 1010-1105 55 minutes  1:1 No c/o pain.  Standing balance training with baseball game with pt able to maintain balance with min-mod A and perform swinging baseball bat with initial hand over hand for coordination, then able to perform without assist.  Gait training with EVA walker with +2 assist, 1 person to control trunk and walker, 1 person assist to prevent R LE adduction tone and scissoring.  Pt improves with repetition, able to take more controlled and even steps.  Gait with + 2 hand held assist with improved trunk control and coordination multiple attempts.  Pt verbalizes need to toilet, assisted to toilet with +2 assist for safety and clothing management.  Overall pt with improved attention and coordination noted.  Time 2: 1400-1500 60 minutes  1:1 No c/o pain.  W/c mobility with L LE with initial tactile cues, fading to supervision, min A for steering due to decreased R attention.  Gait with +2 handheld assist multiple attempts with pt requiring most assist to prevent R LE adduction, improved trunk control.  Nu step x 3 minutes with mod cues for attention in busy environment.  Stair negotiation with +2 assist.  Pt limited by visual/perceptual difficulties making stair climbing difficult.  Attempt to have pt write and identify letters, shapes.  Pt able to identify single letters or shapes in L visual field. Pt able to write with max hand over hand assist, frequently turning head and body to L due to visual deficits.   Veona Bittman 01/17/2013, 3:49 PM

## 2013-01-17 NOTE — Plan of Care (Signed)
Problem: RH BLADDER ELIMINATION Goal: RH STG MANAGE BLADDER WITH ASSISTANCE STG Manage Bladder With Mod Assistance  Outcome: Not Progressing Patient incontinent of urine with time toileting at HS. Harland Dingwall, LPN

## 2013-01-17 NOTE — Plan of Care (Signed)
Problem: RH SKIN INTEGRITY Goal: RH STG ABLE TO PERFORM INCISION/WOUND CARE W/ASSISTANCE STG Able To Perform Incision/Wound Care With max Assistance.  Outcome: Not Progressing Staff to complete care  Problem: RH SAFETY Goal: RH STG ADHERE TO SAFETY PRECAUTIONS W/ASSISTANCE/DEVICE STG Adhere to Safety Precautions With max Assistance/Device.  Outcome: Not Progressing Total assist at present Goal: RH STG DECREASED RISK OF FALL WITH ASSISTANCE STG Decreased Risk of Fall With min Assistance.  Outcome: Not Progressing Total assist at present

## 2013-01-17 NOTE — Progress Notes (Signed)
Speech Language Pathology Daily Session Note  Patient Details  Name: Albert [name] MRN: [id number] Date of Birth: 01-Nov-1975  Today's Date: 01/17/2013 Session 1 Time: 1300-1400 Time Calculation (min): 60 min Session 2 Time: 1500-1530 Time Calculation (min): 30 min  Short Term Goals: Week 2: SLP Short Term Goal 1 (Week 2): Patient will demonstrate sustained attention for 1 minute with max A verbal and tactile cues.   SLP Short Term Goal 2 (Week 2): Patient will follow one-step commands with max A verbal and tactile cues.  SLP Short Term Goal 3 (Week 2): Patient will answer yes/no questions with mod A verbal cues.  SLP Short Term Goal 4 (Week 2): Patient will consume Dys.1 textures and thin liquids with no overt s/s of aspiration for 3 consecutive days with max assist clinician assist.   Skilled Therapeutic Interventions: Session 1 Skilled treatment session focused on cognitive recovery with focus on sustained attention to basic, self-care tasks. SLP facilitated session with increased wait time, verbal cues and physical assist to transfer and toilet.  SLP also facilitated session with lunch of Dys.1 textures and thin liquids via cup with hand over hand assist faded to min assist at times.  Patient consumed large cup sips of thin liquids with delayed cough x1 during meal.  Patient demonstrated hard effortful coughs which appeared to reduce suspected penetrates.  Patient consumed 50% of meal and 16oz of thin liquids.   Patient also consumed RN administered medication whole in puree and SLP administered cracker without and overt s/s of aspiration and mild oral residue.  Continue with current plan of care.   Session 2 Skilled treatment session focused on cognitive recovery with focus on sustained attention to SLP directed task.  Patient sustained attention to picking up card and identifying number for ~5 minutes with max assist cues to follow steps to complete task.  Patient was able to progress to  comparing 2 cards to determine which one had the greatest value with max assist question cues.  During structured activity language appeared more appropriate with significantly reduced language of confusion.    FIM:  Expression Expression Mode: Verbal Expression: 3-Expresses basic 50 - 74% of the time/requires cueing 25 - 50% of the time. Needs to repeat parts of sentences. Social Interaction Social Interaction: 3-Interacts appropriately 50 - 74% of the time - May be physically or verbally inappropriate. Problem Solving Problem Solving: 2-Solves basic 25 - 49% of the time - needs direction more than half the time to initiate, plan or complete simple activities Memory Memory: 1-Recognizes or recalls less than 25% of the time/requires cueing greater than 75% of the time FIM - Eating Eating Activity: 3: Helper scoops food on utensil every scoop;4: Helper occasionally brings food to mouth;4: Help with managing cup/glass  Pain Pain Assessment Pain Assessment: No/denies pain  Therapy/Group: Individual Therapy x2  Fae Pippin, M.A., CCC-SLP [phone]  Tiger Spieker 01/17/2013, 5:18 PM

## 2013-01-18 ENCOUNTER — Inpatient Hospital Stay (HOSPITAL_COMMUNITY): Payer: BC Managed Care – PPO | Admitting: Speech Pathology

## 2013-01-18 ENCOUNTER — Inpatient Hospital Stay (HOSPITAL_COMMUNITY): Payer: BC Managed Care – PPO | Admitting: Physical Therapy

## 2013-01-18 DIAGNOSIS — J95821 Acute postprocedural respiratory failure: Secondary | ICD-10-CM

## 2013-01-18 DIAGNOSIS — Z5189 Encounter for other specified aftercare: Secondary | ICD-10-CM

## 2013-01-18 NOTE — Progress Notes (Signed)
Speech Language Pathology Daily Session Note & Weekly Progress Note  Patient Details  Name: Albert [name] MRN: [id number] Date of Birth: [birth year]  Today's Date: 01/18/2013 Time: 1300-1330 Time Calculation (min): 30 min  Short Term Goals: Week 2: SLP Short Term Goal 1 (Week 2): Patient will demonstrate sustained attention for 1 minute with max A verbal and tactile cues.   SLP Short Term Goal 2 (Week 2): Patient will follow one-step commands with max A verbal and tactile cues.  SLP Short Term Goal 3 (Week 2): Patient will answer yes/no questions with mod A verbal cues.  SLP Short Term Goal 4 (Week 2): Patient will consume Dys.1 textures and thin liquids with no overt s/s of aspiration for 3 consecutive days with max assist clinician assist.   Skilled Therapeutic Interventions: Skilled treatment session focused on cognitive recovery with focus on sustained attention to self-care tasks.   SLP facilitated session with increased wait time, verbal cues and physical assist to transfer.  SLP also facilitated session with lunch of Dys.1 textures and thin liquids via cup with set-up and mod faded to min assist.  Patient demonstrated no overt s/s of aspiration during p.o. intake.  Patient consumed 25% of meal and 8oz of thin liquids.   Continue with current plan of care.   FIM:  Comprehension Comprehension Mode: Auditory Comprehension: 1-Understands basic less than 25% of the time/requires cueing 75% of the time Expression Expression Mode: Verbal Expression: 3-Expresses basic 50 - 74% of the time/requires cueing 25 - 50% of the time. Needs to repeat parts of sentences. Social Interaction Social Interaction: 3-Interacts appropriately 50 - 74% of the time - May be physically or verbally inappropriate. Problem Solving Problem Solving: 2-Solves basic 25 - 49% of the time - needs direction more than half the time to initiate, plan or complete simple activities Memory Memory: 1-Recognizes or recalls  less than 25% of the time/requires cueing greater than 75% of the time FIM - Eating Eating Activity: 4: Help with managing cup/glass;4: Helper occasionally brings food to mouth;4: Helper occasionally scoops food on utensil  Pain Pain Assessment Pain Assessment: No/denies pain  Therapy/Group: Individual Therapy   Speech Language Pathology Weekly Progress Note  Patient Details  Name: Albert [name] MRN: [id number] Date of Birth: May 16, 1976  Today's Date: 01/18/2013  Short Term Goals: Week 2: SLP Short Term Goal 1 (Week 2): Patient will demonstrate sustained attention for 1 minute with max A verbal and tactile cues.   SLP Short Term Goal 1 - Progress (Week 2): Met SLP Short Term Goal 2 (Week 2): Patient will follow one-step commands with max A verbal and tactile cues.  SLP Short Term Goal 2 - Progress (Week 2): Met SLP Short Term Goal 3 (Week 2): Patient will answer yes/no questions with mod A verbal cues.  SLP Short Term Goal 3 - Progress (Week 2): Progressing toward goal SLP Short Term Goal 4 (Week 2): Patient will consume Dys.1 textures and thin liquids with no overt s/s of aspiration for 3 consecutive days with max assist clinician assist.  SLP Short Term Goal 4 - Progress (Week 2): Progressing toward goal Week 3: SLP Short Term Goal 1 (Week 3): Patient will demonstrate sustained attention to task for 15 minutes with Mod assist verbal and tactile cues.   SLP Short Term Goal 2 (Week 3): Patient will follow one-step commands with Mod assist verbal and tactile cues.  SLP Short Term Goal 3 (Week 3): Patient will verbalize orientation and awarenss of  deficits with Max assist clinician cues. SLP Short Term Goal 4 (Week 3): Patient will consume Dys.2 textures and thin liquids with no overt s/s of aspiration with Min-Mod clinician assist.  SLP Short Term Goal 5 (Week 3): Patient will participate in diagnostic assessment of reading comprehension abilities. SLP Short Term Goal 6 (Week 3):  Patient will solve basic, familiar problems with Max assist clinician cues  Weekly Progress Updates: Patient met 2 out of 4 short term goals this reporting period due to gains in sustained attention, ability to follow 1-step directions during structured tasks.  Patient also progressed from p.o. trials to initiation of a Dys.1 texture diet with thin liquids with intermittent episodes of suspected penetration.  Patient is currently a [place] Level V emerging VI.  Given this patient continues to require skilled SLP services to assist with addressing goals for cognitive recovery, lengthening his sustained attention, increasing his orientation and awareness as well as advancing his diet.  By addressing these deficits it will maximize functional independence and reduce burden of care prior to discharge.      SLP Intensity: Minumum of 1-2 x/day, 30 to 90 minutes SLP Frequency: 5 out of 7 days SLP Duration/Estimated Length of Stay: 02/07/13 SLP Treatment/Interventions: Cognitive remediation/compensation;Internal/external aids;Environmental controls;Therapeutic Activities;Patient/family education;Dysphagia/aspiration precaution training;Cueing hierarchy;Functional tasks;Speech/Language facilitation  Albert [name]., CCC-SLP [phone]  Albert [name] 01/18/2013, 3:57 PM

## 2013-01-18 NOTE — Plan of Care (Signed)
Problem: RH PAIN MANAGEMENT Goal: RH STG PAIN MANAGED AT OR BELOW PT'S PAIN GOAL Pain managed with prn medications  Outcome: Progressing Denies pain

## 2013-01-18 NOTE — Progress Notes (Signed)
Patient ID: Albert [name], male   DOB: 07-21-1976, 37 y.o.   MRN: [id number]   Patient ID: Albert [name], male   DOB: 06-21-1976, 37 y.o.   MRN: [id number] Subjective/Complaints: 5/10.  Slow to arouse this am.  Opens eyes to verbal stimuli with minimal response.  Remains in an enclosed bed  Gen.-Lethargic; HEENT-normal to the responses oropharynx not well visualized; chest clear; cardiovascular normal heart sounds no tachycardia; abdomen benign; extremities no edema  Impr-status post multiple trauma with traumatic brain injury Status post cardiac arrest; status post respiratory failure    Objective: Vital Signs: Blood pressure 118/69, pulse 75, temperature 98.6 F (37 C), temperature source Oral, resp. rate 18, height 6' (1.829 m), weight 81.5 kg (179 lb 10.8 oz), SpO2 99.00%. No results found. No results found for this basename: WBC, HGB, HCT, PLT,  in the last 72 hours No results found for this basename: NA, K, CL, CO, GLUCOSE, BUN, CREATININE, CALCIUM,  in the last 72 hours CBG (last 3)  No results found for this basename: GLUCAP,  in the last 72 hours  Wt Readings from Last 3 Encounters:  01/03/13 83.3 kg (183 lb 10.3 oz)  01/03/13 83.3 kg (183 lb 10.3 oz)  01/03/13 83.3 kg (183 lb 10.3 oz)    Physical Exam:  Eyes:  Pupils reactive to light  Neck: Neck supple. No thyromegaly present.  Cardiovascular: Normal rate and regular rhythm.  Pulmonary/Chest: Effort normal and breath sounds normal. No respiratory distress.  Abdominal: Soft. Bowel sounds are normal. He exhibits no distension.  Neurological:     RUE tone still is trace to 1. RUE is grossly 3-4/5. RLE is 3-4 prox to 0 to 1/5 at ankle. Senses pain on right. DTR's 3+. Answers simple questions. Limited insight and awareness. Makes good eye contact but attention only for a few seconds with me.  Skin:  Craniotomy site without drainage.   Peg and donor sites on abdomen are intact with only minimal  drainage   Assessment/Plan: 1. 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 Physiatrist is providing close team supervision and 24 hour management of active medical problems listed below. Physiatrist and rehab team continue to assess barriers to discharge/monitor patient progress toward functional and medical goals. FIM: FIM - Bathing Bathing Steps Patient Completed: Chest;Right Arm Bathing: 0: Activity did not occur  FIM - Upper Body Dressing/Undressing Upper body dressing/undressing steps patient completed: Put head through opening of pull over shirt/dress Upper body dressing/undressing: 2: Max-Patient completed 25-49% of tasks FIM - Lower Body Dressing/Undressing Lower body dressing/undressing steps patient completed: Pull pants up/down;Don/Doff left sock;Don/Doff left shoe Lower body dressing/undressing: 0: Activity did not occur  FIM - Toileting Toileting: 1: Total-Patient completed zero steps, helper did all 3  FIM - Archivist Transfers: 0-Activity did not occur  FIM - Banker Devices: Arm rests Bed/Chair Transfer: 4: Sit > Supine: Min A (steadying pt. > 75%/lift 1 leg);2: Bed > Chair or W/C: Max A (lift and lower assist);2: Chair or W/C > Bed: Max A (lift and lower assist)  FIM - Locomotion: Wheelchair Locomotion: Wheelchair: 0: Activity did not occur FIM - Locomotion: Ambulation Locomotion: Ambulation Assistive Devices: Fara Boros Ambulation/Gait Assistance: 1: +2 Total assist Locomotion: Ambulation: 1: Two helpers  Comprehension Comprehension Mode: Auditory Comprehension: 1-Understands basic less than 25% of the time/requires cueing  75% of the time  Expression Expression Mode: Verbal Expression Assistive Devices: 6-Other (Comment) (stoma site/dry  dressing/healing) Expression: 3-Expresses basic 50 - 74% of the time/requires cueing 25 - 50% of the time. Needs to repeat parts of sentences.  Social Interaction Social Interaction Mode: Asleep Social Interaction: 3-Interacts appropriately 50 - 74% of the time - May be physically or verbally inappropriate.  Problem Solving Problem Solving: 2-Solves basic 25 - 49% of the time - needs direction more than half the time to initiate, plan or complete simple activities  Memory Memory: 1-Recognizes or recalls less than 25% of the time/requires cueing greater than 75% of the time  Medical Problem List and Plan:  1. 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 Status post craniotomy evacuation hematoma with insertion of bone flap abdominal left upper quadrant 12/15/2012 with revision after midline shift 12/28/2012 ---RLAS V at this point 2. DVT Prophylaxis/Anticoagulation: SCDs. Dopplers negative 3. Numerous facial fractures. Conservative care per ENT  4. Mood: Ativan as needed, Inderal 80 mg every 8 hours.  -re-establish sleep cycle/ sleep chart.  -ritalin- continue for attention -enclosure bed still required 5. Neuropsych: This patient is not capable of making decisions on his own behalf.  6. Seizure disorder. Vimpat--taper to 50mg  bid--dc today continue Keppra, Dilantin.    7. Dysphagia/VDRF. Gastrostomy PEG tube/trach tube 12/31/2012 per trauma services.  -decannulated without difficulty--occlusive dressing--stoma needs to be packed  -diet D1  -continue on current TF for now until po increases 8. Alcohol abuse. Counseling  9. Wound care: local care to PEG, crani, and abdominal sites.  -local care-- -helmet at some point 10. Temp: afebrile, stable leukocytosis -urine culture and cxr negative, dopplers negative. 11. Spasticity:   low dose baclofen  -continued therapy, engagement of right side--improving 12. Urinary frequency  -check urine analysis  and culture  -likely central component     LOS (Days) 15 A FACE TO FACE EVALUATION WAS PERFORMED  Rogelia Boga 01/18/2013 8:41 AM

## 2013-01-18 NOTE — Progress Notes (Signed)
Physical Therapy Session Note  Patient Details  Name: BATU CASSIN MRN: [id number] Date of Birth: 10/10/1975  Today's Date: 01/18/2013 Time: 4098-1191 Time Calculation (min): 28 min  Short Term Goals: Week 3:  PT Short Term Goal 1 (Week 3): Pt will perform bed mobility with supervision for supine to sit and min assist of one leg for sit to supine.   PT Short Term Goal 2 (Week 3): Transfers bed to [facility] on left mod assist.  Transfers WC to bed on right max assist PT Short Term Goal 3 (Week 3): Gait with LRAD >60' mod two person assist.   PT Short Term Goal 4 (Week 3): Pt will attend to singular task in minimally distracting envornment for > 5 mins with min cues to redirect.   PT Short Term Goal 5 (Week 3): Pt will perform static standing activity for > 5 mins with min assist for balance with upper extremity support.    Skilled Therapeutic Interventions/Progress Updates:   Treatment centered around playing horseshoes.  Sit to stands with total @+2, pt = 60-80%, first stand pt seemed to have difficulty finding his balance with RLE "dancing" around and falling to the R.  Second stand he maintained his stability.  Dynamic standing with rotational squats to the left to find horseshoes and then toss to target, overall with total @+2.  Pt needing max instructional cues to perform the task.  Pt's verbal responses not matching the conversation.  Pt needing max direction to find objects.  Gait on unit with total @+2/HHA x 120', RLE with ataxic pattern, swing through with the R foot in supination and plantar flexion.  With increasing distance his steps became more 'fluid'.  Sister, Mardene Celeste, present for session, appropriately cueing and assisting patient.   Therapy Documentation Precautions:  Precautions Precautions: Fall Precaution Comments: L crani, skull cap removed  and in abdomen Required Braces or Orthoses: Other Brace/Splint Other Brace/Splint: abdominal binder Restrictions Weight Bearing  Restrictions: No Pain:  Pt did not indicate that he was in any pain. See FIM for current functional status  Therapy/Group: Individual Therapy  Georges Mouse 01/18/2013, 3:35 PM

## 2013-01-19 ENCOUNTER — Inpatient Hospital Stay (HOSPITAL_COMMUNITY): Payer: BC Managed Care – PPO

## 2013-01-19 NOTE — Progress Notes (Signed)
Occupational Therapy Session Note  Patient Details  Name: Albert [name] MRN: [id number] Date of Birth: 09/03/1976  Today's Date: 01/19/2013 Time: 4098-1191 Time Calculation (min): 70 min  Short Term Goals: Week 1:  OT Short Term Goal 1 (Week 1): Bed Mobility, right/left with max assist X1 OT Short Term Goal 2 (Week 1): Demo focused attention during ADL for 15 seconds OT Short Term Goal 3 (Week 1): Perform 1 seated grooming activity with moderate assist OT Short Term Goal 4 (Week 1): Dress upper body with max assist X1  Skilled Therapeutic Interventions: ADL-retraining with emphasis on sustained attention, communication (comprehension and expression), functional transfers, and functional use of bil UE.   Patient asleep at time of treatment but aroused after 5 minutes of slow adjustments to environment (lighting, simple spoken comments).   Patient was assisted with lower body dressing (stockings and socks) and required manual facilitation to place right hand at elastic border of stockings and socks prior to donning.   Binder applied with total assist although patient able to follow cues for positioning.   Additional assist was available but not required for bed mobility and transfer from edge of bed to w/c (squat pivot performed by pt with steadying assist X1).  Patient rinsed and washed his face when handed washcloth in prep for shaving; patient completed approx 30% of washing.   Patient demo'd excellent attention to verbal cues, while holding a conversation with OT who was providing total assist with shaving using available safety razors.   Patient's speech and communication while allowing assisted shave was fluid and coherent approx 75%, mixed with incorrect words.  Total assisted shaving at sink side required approx 15 minutes due to thick beard.   Recommend use of electric razor for patient to perform shaving using right hand.   Patient positioned at RN station for planned peg-tube feeding at end of  session.    Therapy Documentation Precautions:  Precautions Precautions: Fall Precaution Comments: L crani, skull cap removed  and in abdomen Required Braces or Orthoses: Other Brace/Splint Other Brace/Splint: abdominal binder Restrictions Weight Bearing Restrictions: No  Pain: Pain Assessment Pain Assessment: No/denies pain  See FIM for current functional status  Therapy/Group: Individual Therapy  Georgeanne Nim 01/19/2013, 10:30 AM

## 2013-01-19 NOTE — Progress Notes (Signed)
Patient ID: Albert [name], male   DOB: [birth year], 37 y.o.   MRN: [id number]  Patient ID: Albert [name], male   DOB: 10-10-1975, 37 y.o.   MRN: [id number]   Patient ID: Albert [name], male   DOB: 06/12/1976, 37 y.o.   MRN: [id number] Subjective/Complaints: 23/68.  37 year old patient with a history of traumatic brain injury with subdural hemorrhage following injury. Hospital course complicated by cardiac and respiratory failure.  Slow to arouse this am.  Opens eyes to verbal stimuli with minimal response.  Remains in an enclosed bed  Gen.-Lethargic; HEENT-normal to the responses oropharynx not well visualized; chest clear; cardiovascular normal heart sounds no tachycardia; abdomen benign; wearing adult diaper;  extremities no edema  BP Readings from Last 3 Encounters:  01/19/13 128/78  01/03/13 130/84  01/03/13 130/84    Impr-status post multiple trauma with traumatic brain injury Status post cardiac arrest; status post respiratory failure    Objective: Vital Signs: Blood pressure 128/78, pulse 66, temperature 98.5 F (36.9 C), temperature source Axillary, resp. rate 17, height 6' (1.829 m), weight 81.5 kg (179 lb 10.8 oz), SpO2 97.00%. No results found. No results found for this basename: WBC, HGB, HCT, PLT,  in the last 72 hours No results found for this basename: NA, K, CL, CO, GLUCOSE, BUN, CREATININE, CALCIUM,  in the last 72 hours CBG (last 3)  No results found for this basename: GLUCAP,  in the last 72 hours  Wt Readings from Last 3 Encounters:  01/03/13 83.3 kg (183 lb 10.3 oz)  01/03/13 83.3 kg (183 lb 10.3 oz)  01/03/13 83.3 kg (183 lb 10.3 oz)    Physical Exam:  Eyes:  Pupils reactive to light  Neck: Neck supple. No thyromegaly present.  Cardiovascular: Normal rate and regular rhythm.  Pulmonary/Chest: Effort normal and breath sounds normal. No respiratory distress.  Abdominal: Soft. Bowel sounds are normal. He exhibits no distension.  Neurological:     RUE tone still  is trace to 1. RUE is grossly 3-4/5. RLE is 3-4 prox to 0 to 1/5 at ankle. Senses pain on right. DTR's 3+. Answers simple questions. Limited insight and awareness. Makes good eye contact but attention only for a few seconds with me.  Skin:  Craniotomy site without drainage.   Peg and donor sites on abdomen are intact with only minimal drainage   Assessment/Plan: 1. 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 Physiatrist is providing close team supervision and 24 hour management of active medical problems listed below. Physiatrist and rehab team continue to assess barriers to discharge/monitor patient progress toward functional and medical goals. FIM: FIM - Bathing Bathing Steps Patient Completed: Chest;Right Arm Bathing: 0: Activity did not occur  FIM - Upper Body Dressing/Undressing Upper body dressing/undressing steps patient completed: Put head through opening of pull over shirt/dress Upper body dressing/undressing: 2: Max-Patient completed 25-49% of tasks FIM - Lower Body Dressing/Undressing Lower body dressing/undressing steps patient completed: Pull pants up/down;Don/Doff left sock;Don/Doff left shoe Lower body dressing/undressing: 0: Activity did not occur  FIM - Toileting Toileting: 1: Total-Patient completed zero steps, helper did all 3  FIM - Archivist Transfers: 0-Activity did not occur  FIM - Banker Devices: Arm rests Bed/Chair Transfer: 1: Two helpers  FIM - Locomotion: Wheelchair Locomotion: Wheelchair: 1: Total Assistance/staff pushes wheelchair (Pt<25%) FIM - Locomotion: Ambulation Locomotion:  Ambulation Assistive Devices: Other (comment) (Bilateral HHA) Ambulation/Gait Assistance: 1: +2 Total assist Locomotion: Ambulation: 1: Two  helpers  Comprehension Comprehension Mode: Auditory Comprehension: 1-Understands basic less than 25% of the time/requires cueing 75% of the time  Expression Expression Mode: Verbal Expression Assistive Devices: 6-Other (Comment) (stoma site/dry dressing/healing) Expression: 3-Expresses basic 50 - 74% of the time/requires cueing 25 - 50% of the time. Needs to repeat parts of sentences.  Social Interaction Social Interaction Mode: Asleep Social Interaction: 3-Interacts appropriately 50 - 74% of the time - May be physically or verbally inappropriate.  Problem Solving Problem Solving: 2-Solves basic 25 - 49% of the time - needs direction more than half the time to initiate, plan or complete simple activities  Memory Memory: 1-Recognizes or recalls less than 25% of the time/requires cueing greater than 75% of the time  Medical Problem List and Plan:  1. 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 Status post craniotomy evacuation hematoma with insertion of bone flap abdominal left upper quadrant 12/15/2012 with revision after midline shift 12/28/2012 ---RLAS V at this point 2. DVT Prophylaxis/Anticoagulation: SCDs. Dopplers negative 3. Numerous facial fractures. Conservative care per ENT  4. Mood: Ativan as needed, Inderal 80 mg every 8 hours.  -re-establish sleep cycle/ sleep chart.  -ritalin- continue for attention -enclosure bed still required 5. Neuropsych: This patient is not capable of making decisions on his own behalf.  6. Seizure disorder. Vimpat--taper to 50mg  bid--dc today continue Keppra, Dilantin.    7. Dysphagia/VDRF. Gastrostomy PEG tube/trach tube 12/31/2012 per trauma services.  -decannulated without difficulty--occlusive dressing--stoma needs to be packed  -diet D1  -continue on current TF for now until po increases 8. Alcohol abuse. Counseling  9. Wound care: local care to PEG, crani, and abdominal sites.  -local care-- -helmet  at some point 10. Temp: afebrile, stable leukocytosis -urine culture and cxr negative, dopplers negative. 11. Spasticity:   low dose baclofen  -continued therapy, engagement of right side--improving 12. Urinary frequency  -check urine analysis and culture  -likely central component     LOS (Days) 16 A FACE TO FACE EVALUATION WAS PERFORMED  Rogelia Boga 01/19/2013 8:06 AM

## 2013-01-20 ENCOUNTER — Inpatient Hospital Stay (HOSPITAL_COMMUNITY): Payer: BC Managed Care – PPO

## 2013-01-20 ENCOUNTER — Inpatient Hospital Stay (HOSPITAL_COMMUNITY): Payer: BC Managed Care – PPO | Admitting: Occupational Therapy

## 2013-01-20 ENCOUNTER — Inpatient Hospital Stay (HOSPITAL_COMMUNITY): Payer: BC Managed Care – PPO | Admitting: Physical Therapy

## 2013-01-20 DIAGNOSIS — S069X9A Unspecified intracranial injury with loss of consciousness of unspecified duration, initial encounter: Secondary | ICD-10-CM

## 2013-01-20 DIAGNOSIS — S069XAA Unspecified intracranial injury with loss of consciousness status unknown, initial encounter: Secondary | ICD-10-CM

## 2013-01-20 LAB — URINALYSIS, ROUTINE W REFLEX MICROSCOPIC
Bilirubin Urine: NEGATIVE
Glucose, UA: NEGATIVE mg/dL
Hgb urine dipstick: NEGATIVE
Ketones, ur: NEGATIVE mg/dL
Leukocytes, UA: NEGATIVE
Nitrite: NEGATIVE
Protein, ur: NEGATIVE mg/dL
Specific Gravity, Urine: 1.031 — ABNORMAL HIGH (ref 1.005–1.030)
Urobilinogen, UA: 0.2 mg/dL (ref 0.0–1.0)
pH: 6 (ref 5.0–8.0)

## 2013-01-20 NOTE — Progress Notes (Signed)
NUTRITION FOLLOW UP  DOCUMENTATION CODES  Per approved criteria   -Severe malnutrition in the context of acute injury   Intervention:    Continue bolus feedings:  300 ml of Vital 1.5 via tube 0600 and 2200  300 ml of Vital 1.5 via tube at 0800, 1300, and 1800 ONLY if pt consumes <50% of meals.  30 ml Prostat via tube TID, each supplement provides 100 kcal and 15 grams protein.  Total regimen will provide: 2550 kcal, 147 grams protein, and 1146 ml free water.  Continue fluid flushes of 200 ml six times daily, provide 100 ml water before and 100 ml after each bolus. This provides an additional 1200 ml free water daily.  Continue [employer]reeze po daily, each supplement provides 250 kcal and 9 grams of protein.  RD to continue to assess enteral tolerance  Nutrition Dx:   Inadequate oral intake related to inability to eat as evidenced by NPO status. Ongoing.  Goal:   Pt to meet >/= 90% of their estimated nutrition needs. Met.  Monitor:   TF tolerance, weight, labs, PO intake  Assessment:   Pt admitted after fall, positive for ETOH. Has SDH and SAH, multiple facial fractures, s/p decompressive craniectomy of L SDH. Pt with questionable cardiac arrest with CPR. Pt extubated 4/14, developed seizures and required second craniectomy. Pt had trach and PEG 4/22 and extubated on 4/24 and remains on trach collar. Pt has had persistent fevers this admission due to neuro storming.   Dysphagia 1 diet initiated 5/7, also ordered for thin liquids, no straws and meds via alternative means. RN reports that pt did not consume much for breakfast.  Continues with order for veil bed.  Receiving 300 ml of Vital 1.5 via tube six times daily. This regimen provides: 2700 kcal, 122 grams protein, and 1375 ml free water. RN reports pt is tolerating goal TF regimen well at this time. Per chart review, pt has only been able to hold 2 boluses over the weekend 2/2 oral intake >50%.  Height: Ht Readings  from Last 1 Encounters:  01/03/13 6' (1.829 m)    Weight Status:   Wt Readings from Last 1 Encounters:  01/03/13 183 lb 10.3 oz (83.3 kg)  Wt variable. Wife at bedside reports usual weight PTA was around 200 - 205 lb. This is a weight loss of 16.5 % x 1 month.  Pt meets criteria for severe MALNUTRITION in the context of acute injury as evidenced by 16.5% wt loss x 1 month and moderate muscle wasting.  Re-estimated needs:  Kcal: 2500 - 2800 Protein: at least 120 grams protein Fluid: 2.5 - 2.8 liters daily  Skin: multiple incisions  Diet Order: Dysphagia 1; thins   Intake/Output Summary (Last 24 hours) at 01/20/13 1200 Last data filed at 01/20/13 0900  Gross per 24 hour  Intake   1597 ml  Output      0 ml  Net   1597 ml    Last BM: 5/12 large and loose   Labs:  No results found for this basename: NA, K, CL, CO2, BUN, CREATININE, CALCIUM, MG, PHOS, GLUCOSE,  in the last 168 hours  CBG (last 3)  No results found for this basename: GLUCAP,  in the last 72 hours  Scheduled Meds: . antiseptic oral rinse  15 mL Mouth Rinse QID  . baclofen  10 mg Per Tube BID  . chlorhexidine  15 mL Mouth Rinse BID  . feeding supplement  30 mL Per Tube  TID  . feeding supplement  1 Container Oral Q24H  . feeding supplement (VITAL 1.5 CAL)  300 mL Per Tube Custom  . free water  200 mL Per Tube Q4H while awake  . levETIRAcetam  1,500 mg Per Tube BID  . methylphenidate  15 mg Per Tube BID WC  . multivitamin  5 mL Oral Daily  . pantoprazole sodium  40 mg Per Tube Daily  . phenytoin  100 mg Per Tube TID  . propranolol  80 mg Per Tube Q8H  . saccharomyces boulardii  250 mg Oral BID    Continuous Infusions: none    Jarold Motto MS, RD, LDN Pager: [phone] After-hours pager: [phone]

## 2013-01-20 NOTE — Progress Notes (Signed)
Occupational Therapy Session Note  Patient Details  Name: Albert [name] MRN: [id number] Date of Birth: November 05, 1975  Today's Date: 01/20/2013 Time: 3086-5784 Time Calculation (min): 41 min  Skilled Therapeutic Interventions/Progress Updates:    Pt began session working on [employer].  Only could persuade him to take 2 bites (one pureed broccoli the other apple sauce).  Needed mod facilitation in the right arm to eat, after therapist helped place utensil.  He was initially unable to locate the apple sauce which was located in the right visual field.  Progressed from eating down to the therapy gym and pt transferred to therapy mat squat pivot with min assist.  Worked on using the RUE for reaching forward and grasping rings and moving them from one side of the arc to the other.  Pt unable to correctly identify any of the colors and needed mod assist to grasp ring as well.  Transitioned to having pt work on throwing a baseball with the right hand.  Pt able to toss but not very far and needed mod demonstrational cueing to attempt throwing overhead.  During session pt with general conversation, mostly not making sense and not able to state appropriate answers.  Unable to choose place (hospital) when given 2 choices.  Therapy Documentation Precautions:  Precautions Precautions: Fall Precaution Comments: L crani, skull cap removed  and in abdomen Required Braces or Orthoses: Other Brace/Splint Other Brace/Splint: abdominal binder Restrictions Weight Bearing Restrictions: No  Pain: Pain Assessment Pain Assessment: No/denies pain  See FIM for current functional status  Therapy/Group: Individual Therapy  [name] OTR/L Pager number [phone] 01/20/2013, 3:58 PM

## 2013-01-20 NOTE — Progress Notes (Signed)
Occupational Therapy Session Note  Patient Details  Name: JAQUAVIUS HUDLER MRN: [id number] Date of Birth: 10/04/1975  Today's Date: 01/20/2013 Time: 0830-0930 Time Calculation (min): 60 min   Skilled Therapeutic Interventions/Progress Updates:    1:1 self care retraining at sink level with focus on UB bathing UB/LB dressing sit to stands, following one step directions, simple problem solving, standing balance, sustained attention, orientation with mod to max cuing with extra time., bilateral UE use with bathing, dressing, grooming and self feeding. Pt with increased neologisms and language of confusion but able to correct 50% of time regarding biographical information. Pt with increased intellectual awareness today with increased frustration with body impairments with dressing. Pt with presentation of visual cut in right field today in functional setting.  Therapy Documentation Precautions:  Precautions Precautions: Fall Precaution Comments: L crani, skull cap removed  and in abdomen Required Braces or Orthoses: Other Brace/Splint Other Brace/Splint: abdominal binder Restrictions Weight Bearing Restrictions: No Pain: Pain Assessment Pain Assessment: No/denies pain  See FIM for current functional status  Therapy/Group: Individual Therapy  [name] [facility] 01/20/2013, 3:02 PM

## 2013-01-20 NOTE — Plan of Care (Signed)
Problem: RH PAIN MANAGEMENT Goal: RH STG PAIN MANAGED AT OR BELOW PT'S PAIN GOAL Pain managed with prn medications  Outcome: Progressing Denies Pain

## 2013-01-20 NOTE — Progress Notes (Signed)
Recreational Therapy Session Note  Patient Details  Name: Albert [name] MRN: [id number] Date of Birth: 07-Jun-1976 Today's Date: 01/20/2013 Time:  1007-1030 Pain: c/o pain, RN made aware Skilled Therapeutic Interventions/Progress Updates: Session focused on dynamic standing balance, RUE use-gross & active assist level, & selective attention for modified baseball activity with min- mod assist Therapy/Group: Co-Treatment Sevannah Madia 01/20/2013, 4:12 PM

## 2013-01-20 NOTE — Progress Notes (Signed)
Physical Therapy Note  Patient Details  Name: Albert [name] MRN: [id number] Date of Birth: April 24, 1976 Today's Date: 01/20/2013  Time 1: 505 268 4900 60 minutes  1:1 No c/o pain.  Gait training with +2 assist, min-mod A for balance with L hand held, mod A to prevent R LE adduction throughout gait.  Pt with more fluid steps and improved cadence, step and stride length, improved trunk control.  Able to gait 150' multiple attempts.  Standing balance training with ball toss with pt able to use B UEs to catch and throw ball.  Pt able to name alternating letters of the alphabet and count 1-20 while catching.  Attempt to have pt name "foods", pt with language of confusion during that task.  Standing baseball batting activity with min-mod A for balance, pt with good use of B UEs.  Time 2: 1300-1400 60 minutes  1:1 No c/o pain.  Gait with +2 min A for 150' multiple attempts, pt requires only min facilitation to prevent R LE adduction due to increased tone.  Car transfer with max A for motor planning ? Due to visual/perceptual difficulties.  Nu step for reciprocal UE/LE ROM x 5 mins with min cues for attention to task.  Self feeding task using R UE with occasional hand over hand assist and visual/verbal cuing to attend to food in midline.  Pt with decreased agitation and improved attention today.   Kalvin Buss 01/20/2013, 10:28 AM

## 2013-01-20 NOTE — Progress Notes (Signed)
Speech Language Pathology Daily Session Note  Patient Details  Name: Albert [name] MRN: [id number] Date of Birth: [birth year]  Today's Date: 01/20/2013 Time: 1445-1530 Time Calculation (min): 45 min  Short Term Goals: Week 3: SLP Short Term Goal 1 (Week 3): Patient will demonstrate sustained attention to task for 15 minutes with Mod assist verbal and tactile cues.   SLP Short Term Goal 2 (Week 3): Patient will follow one-step commands with Mod assist verbal and tactile cues.  SLP Short Term Goal 3 (Week 3): Patient will verbalize orientation and awarenss of deficits with Max assist clinician cues. SLP Short Term Goal 4 (Week 3): Patient will consume Dys.2 textures and thin liquids with no overt s/s of aspiration with Min-Mod clinician assist.  SLP Short Term Goal 5 (Week 3): Patient will participate in diagnostic assessment of reading comprehension abilities. SLP Short Term Goal 6 (Week 3): Patient will solve basic, familiar problems with Max assist clinician cues  Skilled Therapeutic Interventions: Skilled treatment focused on cognitive-linguistic goals. SLP facilitated with sorting tasks with coins and colored blocks. Pt appeared lethargic today, and required Max visual, verbal, tactile, and question cues to sort two colors or coins at a time. Of note, at times question if pt was able to properly see all items on the table. Continue current plan of care.   FIM:  Comprehension Comprehension Mode: Auditory Comprehension: 3-Understands basic 50 - 74% of the time/requires cueing 25 - 50%  of the time Expression Expression Mode: Verbal Expression: 3-Expresses basic 50 - 74% of the time/requires cueing 25 - 50% of the time. Needs to repeat parts of sentences. Social Interaction Social Interaction: 3-Interacts appropriately 50 - 74% of the time - May be physically or verbally inappropriate. Problem Solving Problem Solving: 2-Solves basic 25 - 49% of the time - needs direction more than half  the time to initiate, plan or complete simple activities Memory Memory: 2-Recognizes or recalls 25 - 49% of the time/requires cueing 51 - 75% of the time  Pain Pain Assessment Pain Assessment: No/denies pain  Therapy/Group: Individual Therapy  Maxcine Ham 01/20/2013, 4:51 PM  Maxcine Ham, M.A. CCC-SLP

## 2013-01-20 NOTE — Progress Notes (Signed)
Occupational Therapy Weekly Progress Note  Patient Details  Name: Albert [name] MRN: [id number] Date of Birth: [birth year]  Today's Date: 01/20/2013  Patient has met 4 of 4 short term goals.  Pt has made steady progress this admission.  Pt requires mod verbal cues to initiate BADLs and requires HOH assist when engaging RUE into bathing tasks.  Pt is able to pull shirt over head and over trunk after BUE are thread into sleeves.  Pt exhibits difficulty orienting shirt to thread arms. Pt stands at sink with steady A while brushing teeth with LUE however requires max A when brushing teeth with RUE.  Pt continues to demonstrate difficutly locating items in right field of vision. Patient continues to demonstrate the following deficits: muscle weakness and muscle joint tightness, decreased cardiorespiratoy endurance, impaired timing and sequencing, abnormal tone, unbalanced muscle activation, motor apraxia, decreased coordination and decreased motor planning, decreased visual acuity, decreased visual perceptual skills and field cut, decreased midline orientation, decreased attention to right and decreased motor planning, decreased initiation, decreased attention, decreased awareness, decreased problem solving, decreased safety awareness and decreased memory and decreased standing balance, decreased postural control, hemiplegia, decreased balance strategies and difficulty maintaining precautions and therefore will continue to benefit from skilled OT intervention to enhance overall performance with BADL and Reduce care partner burden.  Patient progressing toward long term goals..  Continue plan of care.  OT Short Term Goals Week 1:  OT Short Term Goal 1 (Week 1): Bed Mobility, right/left with max assist X1 OT Short Term Goal 1 - Progress (Week 1): Met OT Short Term Goal 2 (Week 1): Demo focused attention during ADL for 15 seconds OT Short Term Goal 2 - Progress (Week 1): Met OT Short Term Goal 3 (Week 1):  Perform 1 seated grooming activity with moderate assist OT Short Term Goal 3 - Progress (Week 1): Met OT Short Term Goal 4 (Week 1): Dress upper body with max assist X1 OT Short Term Goal 4 - Progress (Week 1): Met Week 2:  OT Short Term Goal 1 (Week 2): pt will perform 3/4 grooming tasks with max verbal cues for initiation OT Short Term Goal 2 (Week 2): pt will perform UB dressing with min A  OT Short Term Goal 3 (Week 2): pt will thread BLE into pants legs with min A  OT Short Term Goal 4 (Week 2): pt will perform UB bathing with min A  Skilled Therapeutic Interventions/Progress Updates:    continue with POC  Therapy Documentation Precautions:  Precautions Precautions: Fall Precaution Comments: L crani, skull cap removed  and in abdomen Required Braces or Orthoses: Other Brace/Splint Other Brace/Splint: abdominal binder Restrictions Weight Bearing Restrictions: No  See FIM for current functional status  Therapy/Group: Individual Therapy  Kassim, Guertin 01/20/2013, 2:46 PM

## 2013-01-20 NOTE — Progress Notes (Signed)
Physical Therapy Weekly Progress Note  Patient Details  Name: Albert [name] MRN: [id number] Date of Birth: 1975-09-16  Today's Date: 01/20/2013  Patient has met 2 of 4 short term goals.  Pt is improving functional mobility and is now min A with transfers, mod A with standing balance x 2-3 minutes without UE support before fatigue.  Gait with +2 assist, min - mod A up to 150'. Pt is able to propel standard w/c short distances before fatigued and inattention.   Patient continues to demonstrate the following deficits: visual and perceptual deficits, decreased attention and awareness, impaired R sided attention, increased tone R LE and UE, decreased balance and coordination and therefore will continue to benefit from skilled PT intervention to enhance overall performance with activity tolerance, balance, postural control, ability to compensate for deficits, functional use of  right upper extremity and right lower extremity, attention, awareness and coordination.  Patient progressing toward long term goals..   PT Short Term Goals Week 3:  PT Short Term Goal 1 (Week 3): Pt will perform bed mobility with supervision for supine to sit and min assist of one leg for sit to supine.   PT Short Term Goal 1 - Progress (Week 3): Met PT Short Term Goal 2 (Week 3): Transfers bed to [facility] on left mod assist.  Transfers WC to bed on right max assist PT Short Term Goal 2 - Progress (Week 3): Met PT Short Term Goal 3 (Week 3): Gait with LRAD >60' mod two person assist.   PT Short Term Goal 3 - Progress (Week 3): Met PT Short Term Goal 4 (Week 3): Pt will attend to singular task in minimally distracting envornment for > 5 mins with min cues to redirect.   PT Short Term Goal 4 - Progress (Week 3): Progressing toward goal PT Short Term Goal 5 (Week 3): Pt will perform static standing activity for > 5 mins with min assist for balance with upper extremity support.   PT Short Term Goal 5 - Progress (Week 3): Progressing  toward goal Week 4:  PT Short Term Goal 1 (Week 4): Pt will demo static standing balance for funcitonal activity x 5 mins with min A PT Short Term Goal 2 (Week 4): Pt will gait with mod A in controlled environment 50' PT Short Term Goal 3 (Week 4): Pt will demo sustained attention to task x 5 minutes with min cuing in min distracting environment  Skilled Therapeutic Interventions/Progress Updates:  Ambulation/gait training;Balance/vestibular training;Cognitive remediation/compensation;Community reintegration;Functional electrical stimulation;Patient/family education;Stair training;UE/LE Strength taining/ROM;UE/LE Coordination activities;Splinting/orthotics;Pain management;DME/adaptive equipment instruction;Neuromuscular re-education;Therapeutic Exercise;Therapeutic Activities;Functional mobility training;Discharge planning;Wheelchair propulsion/positioning    See FIM for current functional status   Albert [name] 01/20/2013, 2:57 PM

## 2013-01-21 ENCOUNTER — Inpatient Hospital Stay (HOSPITAL_COMMUNITY): Payer: BC Managed Care – PPO | Admitting: Speech Pathology

## 2013-01-21 ENCOUNTER — Inpatient Hospital Stay (HOSPITAL_COMMUNITY): Payer: BC Managed Care – PPO | Admitting: Physical Therapy

## 2013-01-21 ENCOUNTER — Inpatient Hospital Stay (HOSPITAL_COMMUNITY): Payer: BC Managed Care – PPO | Admitting: Occupational Therapy

## 2013-01-21 ENCOUNTER — Encounter (HOSPITAL_COMMUNITY): Payer: BC Managed Care – PPO | Admitting: Occupational Therapy

## 2013-01-21 LAB — URINE CULTURE
Colony Count: NO GROWTH
Culture: NO GROWTH

## 2013-01-21 MED ORDER — VITAL 1.5 CAL PO LIQD
300.0000 mL | ORAL | Status: DC
  Administered 2013-01-21: 237 mL
  Administered 2013-01-22: 300 mL
  Filled 2013-01-21 (×6): qty 474

## 2013-01-21 NOTE — Progress Notes (Signed)
Occupational Therapy Session Note  Patient Details  Name: Albert [name] MRN: [id number] Date of Birth: 1975/11/22  Today's Date: 01/21/2013 Time: 1478-2956 Time Calculation (min): 45 min  Short Term Goals: Week 2:  OT Short Term Goal 1 (Week 2): pt will perform 3/4 grooming tasks with max verbal cues for initiation OT Short Term Goal 2 (Week 2): pt will perform UB dressing with min A  OT Short Term Goal 3 (Week 2): pt will thread BLE into pants legs with min A  OT Short Term Goal 4 (Week 2): pt will perform UB bathing with min A  Skilled Therapeutic Interventions/Progress Updates:  Patient sitting in w/c while visiting with his mom & dad in the Family Room upon arrival.  Focused session on sustained attention, orientation, visual scanning, RUE & LUE gross and fine motor coordination.  Engaged in sit><stand and standing tolerance 2 times during writing task.  Patient able to write more legibly with left hand (letters to his name) yet often automatically placed the marker in his right hand to attempt to complete writing letters.  Pt only able to complete writing with RUE with hand over hand guidance. When lid to marker was placed in his left hand and marker was in his right hand patient followed command to put the lid on the marker however, a significant overshoot occurred and required hand over hand of both hands for guidance.  Patient in w/c with QRB on and back in the company of his parents.     Therapy Documentation Precautions:  Precautions Precautions: Fall Precaution Comments: L crani, skull cap removed  and in abdomen Required Braces or Orthoses: Other Brace/Splint Other Brace/Splint: abdominal binder Restrictions Weight Bearing Restrictions: No Pain: Pain Assessment Pain Assessment: No/denies pain  Therapy/Group: Individual Therapy  Aqua Denslow 01/21/2013, 3:39 PM

## 2013-01-21 NOTE — Progress Notes (Signed)
Occupational Therapy Session Note  Patient Details  Name: Albert [name] MRN: [id number] Date of Birth: Jan 26, 1976  Today's Date: 01/21/2013 Time: 0930-1030 Time Calculation (min): 60 min  Short Term Goals: Week 2:  OT Short Term Goal 1 (Week 2): pt will perform 3/4 grooming tasks with max verbal cues for initiation OT Short Term Goal 2 (Week 2): pt will perform UB dressing with min A  OT Short Term Goal 3 (Week 2): pt will thread BLE into pants legs with min A  OT Short Term Goal 4 (Week 2): pt will perform UB bathing with min A  Skilled Therapeutic Interventions/Progress Updates:    1:1 self care retraining at shower level with focus on stand pivot transfers, sequencing, task organization, visual scanning and attention, sustatined to selective attention, simple problem solving, intellectual awareness (regarding his body impairments), sit to stand, standing balance with and without UE support, bilateral UE coordination, orientation and biographical information with min to mod cuing. Pt with language of confusion throughout session with decreased awareness   Therapy Documentation Precautions:  Precautions Precautions: Fall Precaution Comments: L crani, skull cap removed  and in abdomen Required Braces or Orthoses: Other Brace/Splint Other Brace/Splint: abdominal binder Restrictions Weight Bearing Restrictions: No Pain: Pain Assessment Pain Assessment: No/denies pain  See FIM for current functional status  Therapy/Group: Individual Therapy  [name] [facility] 01/21/2013, 11:59 AM

## 2013-01-21 NOTE — Progress Notes (Signed)
Physical Therapy Note  Patient Details  Name: Albert [name] MRN: [id number] Date of Birth: 15-Jun-1976 Today's Date: 01/21/2013  Time 1: 1050-1150 60 minutes  1:1 No c/o pain.  Pt reports need to use restroom with min questioning cues.  Pt performed toilet transfers with min-mod A due to visual perceptual difficulties, gait into restroom with +2 mod A.  Pt requires min-mod cues to attend to toileting task, able to initiate hygiene and coordinate wt shifts and R UE use for good hygiene.  Pt assist with clothing management with min-mod A for standing balance (SLS for donning pants).  Gait training throughout unit with +2 min A, pt able to increase distance before increased R LE tone.  Focus on awareness and attention with word finding tasks with pt with 50% accuracy with semantic cues.  Time 2: 1345-1445 60 minutes co-tx with OT  1:1 Pt c/o headache, RN made aware.  Gait training outdoors on a variety of surfaces with pt able gait with +2 min A, continues with increased adductor tone in R LE when fatigued.  Pt requires mod-max cues for problem solving and motor planning for pushing doors open, pressing elevator buttons, etc.  Pt able to interact with therapy dog appropriately with petting dog and feeding it a treat.  Pt able to verbalize more appropriately with mod cues.  Floor transfers with +2 assist for safety, pt > 50%, pt able to problem solve and motor plan with min cuing.  Albert [name] 01/21/2013, 12:27 PM

## 2013-01-21 NOTE — Progress Notes (Signed)
Patient ID: Albert [name], male   DOB: 1976-06-02, 37 y.o.   MRN: [id number] Subjective/Complaints: Resting comfortably. Aroused easily A 12 point review of systems has been performed and if not noted above is otherwise negative.    Objective: Vital Signs: Blood pressure 114/68, pulse 61, temperature 98.1 F (36.7 C), temperature source Oral, resp. rate 17, height 6' (1.829 m), weight 81.5 kg (179 lb 10.8 oz), SpO2 99.00%. No results found. No results found for this basename: WBC, HGB, HCT, PLT,  in the last 72 hours No results found for this basename: NA, K, CL, CO, GLUCOSE, BUN, CREATININE, CALCIUM,  in the last 72 hours CBG (last 3)  No results found for this basename: GLUCAP,  in the last 72 hours  Wt Readings from Last 3 Encounters:  01/03/13 83.3 kg (183 lb 10.3 oz)  01/03/13 83.3 kg (183 lb 10.3 oz)  01/03/13 83.3 kg (183 lb 10.3 oz)    Physical Exam:  Eyes:  Pupils reactive to light  Neck: Neck supple. No thyromegaly present.  Cardiovascular: Normal rate and regular rhythm.  Pulmonary/Chest: Effort normal and breath sounds normal. No respiratory distress.  Abdominal: Soft. Bowel sounds are normal. He exhibits no distension.  Neurological:     RUE tone still is trace to 1. RUE is grossly 3-4/5. RLE is 3-4 prox to 0 to 1/5 at ankle. Senses pain on right. DTR's 3+. Answers simple questions. Limited insight and awareness. Makes good eye contact but attention only for a few seconds with me.  Skin:  Craniotomy site without drainage.   Peg and donor sites on abdomen are intact with only minimal drainage   Assessment/Plan: 1. 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 Physiatrist is providing close team supervision and 24 hour management of active medical problems listed below. Physiatrist and rehab team continue to assess barriers to  discharge/monitor patient progress toward functional and medical goals. FIM: FIM - Bathing Bathing Steps Patient Completed: Chest;Right Arm;Left Arm;Abdomen Bathing: 2: Max-Patient completes 3-4 36f 10 parts or 25-49%  FIM - Upper Body Dressing/Undressing Upper body dressing/undressing steps patient completed: Thread/unthread left sleeve of pullover shirt/dress;Put head through opening of pull over shirt/dress Upper body dressing/undressing: 3: Mod-Patient completed 50-74% of tasks FIM - Lower Body Dressing/Undressing Lower body dressing/undressing steps patient completed: Don/Doff left shoe;Thread/unthread right pants leg;Thread/unthread left pants leg;Pull pants up/down;Don/Doff left sock Lower body dressing/undressing: 2: Max-Patient completed 25-49% of tasks  FIM - Toileting Toileting: 1: Total-Patient completed zero steps, helper did all 3  FIM - Archivist Transfers: 0-Activity did not occur  FIM - Banker Devices: Bed rails;Arm rests Bed/Chair Transfer: 4: Chair or W/C > Bed: Min A (steadying Pt. > 75%);4: Bed > Chair or W/C: Min A (steadying Pt. > 75%)  FIM - Locomotion: Wheelchair Locomotion: Wheelchair: 1: Total Assistance/staff pushes wheelchair (Pt<25%) FIM - Locomotion: Ambulation Locomotion: Ambulation Assistive Devices: Other (comment) (Bilateral HHA) Ambulation/Gait Assistance: 1: +2 Total assist Locomotion: Ambulation: 1: Two helpers  Comprehension Comprehension Mode: Auditory Comprehension: 3-Understands basic 50 - 74% of the time/requires cueing 25 - 50%  of the time  Expression Expression Mode: Verbal Expression Assistive Devices: 6-Other (Comment) Expression: 3-Expresses basic 50 - 74% of the time/requires cueing 25 - 50% of the time. Needs to repeat parts of sentences.  Social Interaction Social Interaction Mode: Asleep Social Interaction: 3-Interacts appropriately  50 - 74% of the time - May be  physically or verbally inappropriate.  Problem Solving Problem Solving: 2-Solves basic 25 - 49% of the time - needs direction more than half the time to initiate, plan or complete simple activities  Memory Memory: 2-Recognizes or recalls 25 - 49% of the time/requires cueing 51 - 75% of the time  Medical Problem List and Plan:  1. 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 Status post craniotomy evacuation hematoma with insertion of bone flap abdominal left upper quadrant 12/15/2012 with revision after midline shift 12/28/2012 ---RLAS V at this point 2. DVT Prophylaxis/Anticoagulation: SCDs. Dopplers negative 3. Numerous facial fractures. Conservative care per ENT  4. Mood: Ativan as needed, Inderal 80 mg every 8 hours.  -re-establish sleep cycle/ sleep chart.  -ritalin- continue for attention -enclosure bed still required 5. Neuropsych: This patient is not capable of making decisions on his own behalf.  6. Seizure disorder. Vimpat--taper to 50mg  bid--dc today continue Keppra, Dilantin.    7. Dysphagia/VDRF. Gastrostomy PEG tube/trach tube 12/31/2012 per trauma services.  -trach stoma healing nicely  -diet D1  -decrease TF to stimulate appetite 8. Alcohol abuse. Counseling  9. Wound care: local care to PEG, crani, and abdominal sites.  -local care-- -helmet at some point 10. Temp: afebrile, stable leukocytosis -urine culture and cxr negative, dopplers negative. 11. Spasticity:   low dose baclofen  -continued therapy, engagement of right side--improving 12. Urinary frequency  -timed voids, bladder education    LOS (Days) 18 A FACE TO FACE EVALUATION WAS PERFORMED  SWARTZ,ZACHARY T 01/21/2013 7:50 AM

## 2013-01-21 NOTE — Progress Notes (Signed)
Occupational Therapy Session Note  Patient Details  Name: Albert [name] MRN: [id number] Date of Birth: Jun 01, 1976  Today's Date: 01/21/2013 Time: 7829-5621 Time Calculation (min): 30 min  Short Term Goals: Week 2:  OT Short Term Goal 1 (Week 2): pt will perform 3/4 grooming tasks with max verbal cues for initiation OT Short Term Goal 2 (Week 2): pt will perform UB dressing with min A  OT Short Term Goal 3 (Week 2): pt will thread BLE into pants legs with min A  OT Short Term Goal 4 (Week 2): pt will perform UB bathing with min A  Skilled Therapeutic Interventions/Progress Updates:    1:1 co treat with Physical therapy with focus on following one step directions, functional ambulation inside on even surface and outside on uneven sidewalk, visual scanning, bilateral UE coordination with operation of doors and elevators, simple problem solving, working memory and recall of rehab floor, sustained attention, intellectual awareness of mistakes in language of expression of thoughts. Pt performed floor transfer to address transitional movements, fall recovery and simple problem solving of body positioning.  Therapy Documentation Precautions:  Precautions Precautions: Fall Precaution Comments: L crani, skull cap removed  and in abdomen Required Braces or Orthoses: Other Brace/Splint Other Brace/Splint: abdominal binder Restrictions Weight Bearing Restrictions: No Pain: C/o HA at beginning of session- RN made aware See FIM for current functional status  Therapy/Group: Individual Therapy and Co-Treatment  [name] [facility] 01/21/2013, 4:09 PM

## 2013-01-21 NOTE — Progress Notes (Signed)
Recreational Therapy Session Note  Patient Details  Name: Albert [name] MRN: [id number] Date of Birth: 01/24/1976 Today's Date: 01/21/2013  Pt participated in animal assisted activity/therapy seated w/c level with supervision.  Dorothea Yow 01/21/2013, 3:07 PM

## 2013-01-21 NOTE — Progress Notes (Signed)
Patient asleep and resting quietly at 1645. Patient noted to be incontinent of urine at 1700. Patient became agitated with staff and did not want to be changed . Staff deescalated patient and was able to changed soiled clothing . Patient assisted up in chair but refused to eat , drank small amount for NT. Patient became agitated and assisted back in bed .NT noted patient becomes agitated every pm after nap between 1600 and 1800. Patient refused RN to give water bolus this pm. Wife in to visit patient and patient more receptive to follow instructions. Patient verbalized to wife "sneaking up on me" . Wife reports patient showed her he was having difficulty seeing on right side. Encouraged staff and wife to approach patient from left side to decrease chance of patient feeling someone is sneaking up on him.patient up in wheelchair with wife at side with quick release belt on resting quietly . Continue with plan of care .      Albert [name]

## 2013-01-21 NOTE — Progress Notes (Signed)
Speech Language Pathology Daily Session Note  Patient Details  Name: Albert [name] MRN: [id number] Date of Birth: 1975/09/20  Today's Date: 01/21/2013 Time: 4098-1191 Time Calculation (min): 45 min  Short Term Goals: Week 3: SLP Short Term Goal 1 (Week 3): Patient will demonstrate sustained attention to task for 15 minutes with Mod assist verbal and tactile cues.   SLP Short Term Goal 2 (Week 3): Patient will follow one-step commands with Mod assist verbal and tactile cues.  SLP Short Term Goal 3 (Week 3): Patient will verbalize orientation and awarenss of deficits with Max assist clinician cues. SLP Short Term Goal 4 (Week 3): Patient will consume Dys.2 textures and thin liquids with no overt s/s of aspiration with Min-Mod clinician assist.  SLP Short Term Goal 5 (Week 3): Patient will participate in diagnostic assessment of reading comprehension abilities. SLP Short Term Goal 6 (Week 3): Patient will solve basic, familiar problems with Max assist clinician cues  Skilled Therapeutic Interventions: Skilled treatment session focused on cognitive recovery with focus on sustained attention to self-care tasks. SLP facilitated session with lunch of Dys.2 textures and thin liquids via cup with set-up and min increased to mod assist. Patient demonstrated no overt s/s of aspiration during p.o. intake. Patient consumed 80% of meal and 8oz of thin liquids. SLP also facilitated session with reading CVC words with ma assist multi-modal cues due to visual-perceptual and attentional deficits.  Continue with current plan of care.    FIM:  Comprehension Comprehension Mode: Auditory Comprehension: 3-Understands basic 50 - 74% of the time/requires cueing 25 - 50%  of the time Expression Expression Mode: Verbal Expression: 3-Expresses basic 50 - 74% of the time/requires cueing 25 - 50% of the time. Needs to repeat parts of sentences. Social Interaction Social Interaction: 3-Interacts appropriately 50 - 74%  of the time - May be physically or verbally inappropriate. Problem Solving Problem Solving: 2-Solves basic 25 - 49% of the time - needs direction more than half the time to initiate, plan or complete simple activities Memory Memory: 1-Recognizes or recalls less than 25% of the time/requires cueing greater than 75% of the time FIM - Eating Eating Activity: 4: Helper occasionally brings food to mouth;3: Helper scoops food on utensil every scoop  Pain Pain Assessment Pain Assessment: No/denies pain  Therapy/Group: Individual Therapy  Charlane Ferretti., CCC-SLP [phone]  Albert [name] 01/21/2013, 2:19 PM

## 2013-01-22 ENCOUNTER — Inpatient Hospital Stay (HOSPITAL_COMMUNITY): Payer: BC Managed Care – PPO | Admitting: Speech Pathology

## 2013-01-22 ENCOUNTER — Encounter (HOSPITAL_COMMUNITY): Payer: BC Managed Care – PPO | Admitting: Speech Pathology

## 2013-01-22 ENCOUNTER — Inpatient Hospital Stay (HOSPITAL_COMMUNITY): Payer: BC Managed Care – PPO | Admitting: *Deleted

## 2013-01-22 ENCOUNTER — Inpatient Hospital Stay (HOSPITAL_COMMUNITY): Payer: BC Managed Care – PPO | Admitting: Occupational Therapy

## 2013-01-22 ENCOUNTER — Encounter (HOSPITAL_COMMUNITY): Payer: BC Managed Care – PPO | Admitting: Occupational Therapy

## 2013-01-22 NOTE — Progress Notes (Signed)
Physical Therapy Note  Patient Details  Name: [name] MRN: [id number] Date of Birth: [birth year] Today's Date: 01/22/2013  Time 1: [id number] [id number] 60 minutes co-tx with TR  1:1 No c/o pain.  Standing balance/memory game with pt tapping patterns of 4 colored blocks with foot.  Pt mod A for balance (+2 for safety), able to recall sequences up to 5, requires min-mod gesturing and verbal cues to correctly tap block.  Good standing tolerance with less R LE tone noted.  Long distance gait training > 300' with no assistance on LE to prevent R adduction, gait with +2 handheld assist due to impaired balance, decreased trunk control ? Due to perceptual deficits.  Seated task with pt to match different shapes made with blocks, pt requires mod A and hand over hand assist due to visual perceptual difficulties.  Good attention this session and improving motor planning and sequencing.  Time 2: 1300-1400 60 minutes co-tx with OT  1:1 No c/o pain.  Pt gait around gift shop with +2 assist, aware of LOB requiring mod A to correct.  Pt able to choose from 2 greeting cards for his wife with min-mod cuing.  Pt requires increased cuing for cognitive tasks when fatigued.  Able to use B UEs to put card in envelope with min A.  Pt able to verbalize frustration with "hands not working right".  Standing for signing his name to card, pt uses R hand and able to make some letters, letters overlapping.  Pt writes in extreme L visual field, continues to be able to verbalize frustration well.  Self feeding for lunch with min cuing.  Much improved awareness this session.   Albert [name] 01/22/2013, 10:32 AM

## 2013-01-22 NOTE — Progress Notes (Signed)
SLP Cancellation Note  Patient Details Name: Albert [name] MRN: [id number] DOB: [birth year]   Cancelled treatment:        Patient missed 30 minutes of skilled services due to refusal to get up.  As a result, patient missed [employer].  Fae Pippin, M.A., CCC-SLP [phone]  Nygel Prokop 01/22/2013, 12:21 PM

## 2013-01-22 NOTE — Progress Notes (Signed)
Speech Language Pathology Daily Session Note  Patient Details  Name: Albert [name] MRN: [id number] Date of Birth: 1976-02-06  Today's Date: 01/22/2013 Time: 1400-1500 Time Calculation (min): 60 min  Short Term Goals: Week 3: SLP Short Term Goal 1 (Week 3): Patient will demonstrate sustained attention to task for 15 minutes with Mod assist verbal and tactile cues.   SLP Short Term Goal 2 (Week 3): Patient will follow one-step commands with Mod assist verbal and tactile cues.  SLP Short Term Goal 3 (Week 3): Patient will verbalize orientation and awarenss of deficits with Max assist clinician cues. SLP Short Term Goal 4 (Week 3): Patient will consume Dys.2 textures and thin liquids with no overt s/s of aspiration with Min-Mod clinician assist.  SLP Short Term Goal 5 (Week 3): Patient will participate in diagnostic assessment of reading comprehension abilities. SLP Short Term Goal 6 (Week 3): Patient will solve basic, familiar problems with Max assist clinician cues  Skilled Therapeutic Interventions: Skilled treatment session focused on cognitive recovery with focus on sustained attention to basic tasks. SLP facilitated session with lunch of Dys.2 textures and thin liquids via cup with Min assist. Patient demonstrated no overt s/s of aspiration during p.o. intake.  SLP also provided Regular texture trials of peanut butter crackers with efficient mastication and no overt s/s of aspiration.  As a result, it is recommended that his diet be upgraded to Dys.3 textures with continuation of other safe swallow precautions.  SLP then facilitated session with Max assist cues to identify object from field of 2; label basic ADL objects accurately; Min assist to id body parts, differentiate between left/right and follow multi-step commands.  SLP questions impact of visual-perceptual deficits; on identifying and labeling objects; however semantic and phonemic cues did not significantly increase accuracy and  neologisms persisted.  Continue with current plan of care.   FIM:  Comprehension Comprehension Mode: Auditory Comprehension: 3-Understands basic 50 - 74% of the time/requires cueing 25 - 50%  of the time Expression Expression Mode: Verbal Expression: 3-Expresses basic 50 - 74% of the time/requires cueing 25 - 50% of the time. Needs to repeat parts of sentences. Social Interaction Social Interaction: 3-Interacts appropriately 50 - 74% of the time - May be physically or verbally inappropriate. Problem Solving Problem Solving: 3-Solves basic 50 - 74% of the time/requires cueing 25 - 49% of the time Memory Memory: 2-Recognizes or recalls 25 - 49% of the time/requires cueing 51 - 75% of the time FIM - Eating Eating Activity: 3: Helper scoops food on utensil every scoop  Pain Pain Assessment Pain Assessment: No/denies pain  Therapy/Group: Individual Therapy  Charlane Ferretti., CCC-SLP [phone]  Albert [name] 01/22/2013, 4:32 PM

## 2013-01-22 NOTE — Progress Notes (Signed)
Occupational Therapy Session Note  Patient Details  Name: Albert [name] MRN: [id number] Date of Birth: 06-25-1976  Today's Date: 01/22/2013 Time: 1330-1400 Time Calculation (min): 30 min  Short Term Goals: Week 2:  OT Short Term Goal 1 (Week 2): pt will perform 3/4 grooming tasks with max verbal cues for initiation OT Short Term Goal 2 (Week 2): pt will perform UB dressing with min A  OT Short Term Goal 3 (Week 2): pt will thread BLE into pants legs with min A  OT Short Term Goal 4 (Week 2): pt will perform UB bathing with min A  Skilled Therapeutic Interventions/Progress Updates:    1: 1 Co treat with physical therapy. Engaged in task of going downstairs and purchasing a card and balloon for his wife for their anniversary today. Focus on functional ambulation in community setting, negotiating large obstacles, visual scanning, eye-hand coordination, making decisions from a field of two, working memory, appropriate conversation, attempted to sign his name on card (in left visual field), simple problem solving etc. Finished by eating lunch in quiet room with A to scoop food on plate; in left field.  Therapy Documentation Precautions:  Precautions Precautions: Fall Precaution Comments: L crani, skull cap removed  and in abdomen Required Braces or Orthoses: Other Brace/Splint Other Brace/Splint: abdominal binder Restrictions Weight Bearing Restrictions: No Pain:  no c/o pain; just that his body feels different and his legs are incoordinated and right LE feels asleep  See FIM for current functional status  Therapy/Group: Individual Therapy  [name] 01/22/2013, 2:44 PM

## 2013-01-22 NOTE — Progress Notes (Signed)
Patient ID: Albert [name], male   DOB: 1976-07-20, 37 y.o.   MRN: [id number] Subjective/Complaints: Up already. No complaints.  A 12 point review of systems has been performed and if not noted above is otherwise negative.    Objective: Vital Signs: Blood pressure 154/74, pulse 69, temperature 98.1 F (36.7 C), temperature source Oral, resp. rate 18, height 6' (1.829 m), weight 81.5 kg (179 lb 10.8 oz), SpO2 98.00%. No results found. No results found for this basename: WBC, HGB, HCT, PLT,  in the last 72 hours No results found for this basename: NA, K, CL, CO, GLUCOSE, BUN, CREATININE, CALCIUM,  in the last 72 hours CBG (last 3)  No results found for this basename: GLUCAP,  in the last 72 hours  Wt Readings from Last 3 Encounters:  01/03/13 83.3 kg (183 lb 10.3 oz)  01/03/13 83.3 kg (183 lb 10.3 oz)  01/03/13 83.3 kg (183 lb 10.3 oz)    Physical Exam:  Eyes:  Pupils reactive to light  Neck: Neck supple. No thyromegaly present.  Cardiovascular: Normal rate and regular rhythm.  Pulmonary/Chest: Effort normal and breath sounds normal. No respiratory distress.  Abdominal: Soft. Bowel sounds are normal. He exhibits no distension.  Neurological:     RUE tone still is trace to 1. RUE is grossly 4/5. RLE is 4 prox to 2-3/5 at ankle. Senses pain on right. DTR's 3+. Answers simple questions. Limited insight and awareness. Makes good eye contact but attention still limited. Appears have visual field deficits, but inconsistent. Continually identified "2" fingers when i had changed to 4.   Skin:  Craniotomy site without drainage.   Peg and donor sites on abdomen are intact with only minimal drainage--staples still in   Assessment/Plan: 1. 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 Physiatrist is providing close team supervision and 24 hour management  of active medical problems listed below. Physiatrist and rehab team continue to assess barriers to discharge/monitor patient progress toward functional and medical goals.  Continued visual deficits. Difficult to pinpoint on exam. As cognition improves we can further localize. Will need formal neuro-optho eval after dc FIM: FIM - Bathing Bathing Steps Patient Completed: Chest;Right Arm;Left Arm;Abdomen;Front perineal area;Right upper leg;Left upper leg Bathing: 3: Mod-Patient completes 5-7 61f 10 parts or 50-74%  FIM - Upper Body Dressing/Undressing Upper body dressing/undressing steps patient completed: Thread/unthread left sleeve of pullover shirt/dress;Put head through opening of pull over shirt/dress;Pull shirt over trunk Upper body dressing/undressing: 4: Min-Patient completed 75 plus % of tasks FIM - Lower Body Dressing/Undressing Lower body dressing/undressing steps patient completed: Don/Doff left shoe;Thread/unthread left pants leg;Pull pants up/down;Don/Doff left sock;Don/Doff right shoe;Don/Doff right sock Lower body dressing/undressing: 3: Mod-Patient completed 50-74% of tasks  FIM - Toileting Toileting: 1: Total-Patient completed zero steps, helper did all 3  FIM - Archivist Transfers: 0-Activity did not occur  FIM - Banker Devices: Bed rails;Arm rests Bed/Chair Transfer: 4: Chair or W/C > Bed: Min A (steadying Pt. > 75%);4: Bed > Chair or W/C: Min A (steadying Pt. > 75%)  FIM - Locomotion: Wheelchair Locomotion: Wheelchair: 1: Total Assistance/staff pushes wheelchair (Pt<25%) FIM - Locomotion: Ambulation Locomotion: Ambulation Assistive Devices: Other (comment) (Bilateral HHA) Ambulation/Gait Assistance: 1: +2 Total assist Locomotion: Ambulation: 1: Two helpers  Comprehension Comprehension Mode: Auditory Comprehension: 3-Understands basic 50 - 74% of the time/requires cueing 25 -  50%  of the  time  Expression Expression Mode: Verbal Expression Assistive Devices: 6-Other (Comment) Expression: 3-Expresses basic 50 - 74% of the time/requires cueing 25 - 50% of the time. Needs to repeat parts of sentences.  Social Interaction Social Interaction Mode: Asleep Social Interaction: 3-Interacts appropriately 50 - 74% of the time - May be physically or verbally inappropriate.  Problem Solving Problem Solving: 2-Solves basic 25 - 49% of the time - needs direction more than half the time to initiate, plan or complete simple activities  Memory Memory: 1-Recognizes or recalls less than 25% of the time/requires cueing greater than 75% of the time  Medical Problem List and Plan:  1. 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 Status post craniotomy evacuation hematoma with insertion of bone flap abdominal left upper quadrant 12/15/2012 with revision after midline shift 12/28/2012 ---RLAS V at this point 2. DVT Prophylaxis/Anticoagulation: SCDs. Dopplers negative 3. Numerous facial fractures. Conservative care per ENT  4. Mood: Ativan as needed, Inderal 80 mg every 8 hours.  -re-establish sleep cycle/ sleep chart.  -ritalin- continue for attention -enclosure bed still required 5. Neuropsych: This patient is not capable of making decisions on his own behalf.  6. Seizure disorder. Vimpat--taper to 50mg  bid--dc today continue Keppra, Dilantin.    7. Dysphagia/VDRF. Gastrostomy PEG tube/trach tube 12/31/2012 per trauma services.  -trach stoma healing nicely  -diet D1  -decrease TF to stimulate appetite 8. Alcohol abuse. Counseling  9. Wound care: local care to PEG, crani, and abdominal sites.  -local care-- -helmet at some point 10. Temp: afebrile, stable leukocytosis -urine culture and cxr negative, dopplers negative. 11. Spasticity:   low dose baclofen  -continued therapy, engagement of right side--improving 12. Urinary frequency  -timed voids,  bladder education    LOS (Days) 19 A FACE TO FACE EVALUATION WAS PERFORMED  SWARTZ,ZACHARY T 01/22/2013 7:33 AM

## 2013-01-22 NOTE — Progress Notes (Signed)
Occupational Therapy Session Note  Patient Details  Name: Albert [name] MRN: [id number] Date of Birth: Apr 12, 1976  Today's Date: 01/22/2013 Time: 0730-0830 Time Calculation (min): 60 min  Short Term Goals: Week 2:  OT Short Term Goal 1 (Week 2): pt will perform 3/4 grooming tasks with max verbal cues for initiation OT Short Term Goal 2 (Week 2): pt will perform UB dressing with min A  OT Short Term Goal 3 (Week 2): pt will thread BLE into pants legs with min A  OT Short Term Goal 4 (Week 2): pt will perform UB bathing with min A  Skilled Therapeutic Interventions/Progress Updates:    1:1 self care retraining at shower level. Pt slow to arouse and come to EOB but engaged in orientation and biographical information. Pt demonstrating language of confusion but aware he has "been working hard on his body the last couple of days." Focus on close supervision to min A transfers with tactile cues for visual orientation and location of handles or grab bars, sequencing and task organization with bathing and dressing. Pt required increased assistance today with orienting clothing (both shirt and shorts) in order to successfully don. Pt able to brush teeth once toothbrush set up with right hand with extra time. Pt consumed 50% of breakfast (all sausage and eggs), self feeding with right hand with assistance to scoop food due to visual disturbances. Pt difficulty with seeing to the right visual field.  Therapy Documentation Precautions:  Precautions Precautions: Fall Precaution Comments: L crani, skull cap removed  and in abdomen Required Braces or Orthoses: Other Brace/Splint Other Brace/Splint: abdominal binder Restrictions Weight Bearing Restrictions: No Pain:  no c/o pain   See FIM for current functional status  Therapy/Group: Individual Therapy  [name] [facility] 01/22/2013, 9:11 AM

## 2013-01-22 NOTE — Progress Notes (Signed)
Recreational Therapy Session Note  Patient Details  Name: Albert [name] MRN: [id number] Date of Birth: August 05, 1976 Today's Date: 01/22/2013 Time:  9-10 Pain: no c/o Skilled Therapeutic Interventions/Progress Updates: Session focused on dynamic standing balance, selective attention, memory, visual scanning, color recognition, following commands during "[employer].  Pt able to recall a sequence up to 5 colors with min-mod subtle, gesturing, & verbal cues identify the correct colored square.  Initially min cues needed, later mod cues due to fatigue.  Pt required +2 assist for safety, pt = ~70%.  Pt attended to task ~7 minutes per repetition before needing seated rest break.  Pt ambulated with HHA +2 for safety >300 feet.  Pt completed tabletop task working on attention, visual scanning & problem solving with max cues & hand over hand assist copying patterns using a combination of 3 different color & size wooden blocks.  Pt verbalized throughout session that right leg/foot wasn't working right and that he couldn't see well.  Therapy/Group: Co-Treatment  Ayce Pietrzyk 01/22/2013, 4:07 PM

## 2013-01-23 ENCOUNTER — Inpatient Hospital Stay (HOSPITAL_COMMUNITY): Payer: BC Managed Care – PPO | Admitting: Speech Pathology

## 2013-01-23 ENCOUNTER — Inpatient Hospital Stay (HOSPITAL_COMMUNITY): Payer: BC Managed Care – PPO | Admitting: *Deleted

## 2013-01-23 ENCOUNTER — Inpatient Hospital Stay (HOSPITAL_COMMUNITY): Payer: BC Managed Care – PPO | Admitting: Occupational Therapy

## 2013-01-23 ENCOUNTER — Inpatient Hospital Stay (HOSPITAL_COMMUNITY): Payer: BC Managed Care – PPO | Admitting: Physical Therapy

## 2013-01-23 MED ORDER — PRO-STAT SUGAR FREE PO LIQD
30.0000 mL | Freq: Three times a day (TID) | ORAL | Status: DC
  Administered 2013-01-23 – 2013-01-27 (×12): 30 mL
  Filled 2013-01-23 (×15): qty 30

## 2013-01-23 MED ORDER — FREE WATER
50.0000 mL | Freq: Two times a day (BID) | Status: DC
  Administered 2013-01-23 – 2013-02-04 (×24): 50 mL

## 2013-01-23 MED ORDER — ENSURE COMPLETE PO LIQD
237.0000 mL | ORAL | Status: DC
  Administered 2013-01-24 – 2013-02-04 (×11): 237 mL via ORAL

## 2013-01-23 NOTE — Patient Care Conference (Signed)
Inpatient RehabilitationTeam Conference and Plan of Care Update Date: 01/21/2013   Time: 3:00 PM    Patient Name: Albert [name]      Medical Record Number: [id number]  Date of Birth: June 29, 1976 Sex: Male         Room/Bed: 4002/4002-01 Payor Info: Payor: BLUE [employer] BLUE [employer] / Plan: [employer] [place] PPO / Product Type: *No Product type* /    Admitting Diagnosis: rt femur fx  Admit Date/Time:  01/03/2013  3:23 PM Admission Comments: No comment available   Primary Diagnosis:  TBI (traumatic brain injury) Principal Problem: TBI (traumatic brain injury)  Patient Active Problem List   Diagnosis Date Noted  . Closed fracture of facial bones 01/03/2013  . TBI (traumatic brain injury) 01/03/2013  . Pneumonia 01/03/2013  . Acute blood loss anemia 12/30/2012  . Subdural hemorrhage following injury 12/15/2012  . Respiratory failure following trauma and surgery 12/15/2012  . Cardiac arrest 12/15/2012  . Blunt trauma of face 12/15/2012  . Blunt head trauma 12/15/2012  . Traumatic subarachnoid bleed with LOC of 30 minutes or less 12/15/2012  . Elevated ETOH level 12/15/2012    Expected Discharge Date: Expected Discharge Date: 02/07/13  Team Members Present: Physician leading conference: Dr. Faith Rogue Social Worker Present: Amada Jupiter, LCSW Nurse Present: Rosalio Macadamia, RN PT Present: Reggy Eye, PT OT Present: [name], Loistine Chance, OT;Patricia [place], OT SLP Present: Feliberto Gottron, SLP Other (Discipline and Name): Tora Duck, PPS Coordinator     Current Status/Progress Goal Weekly Team Focus  Medical   improving insight and awareness. eating well  weaning from TF. maximiize po  continued cognitive perceptual therapies.    Bowel/Bladder   incontinent of bowel and bladder/continent with time toileting in bathroom  min assist  continue with plan of care, insure pt is clean and dry   Swallow/Nutrition/ Hydration   Dys.2 upgrade today and thin liquids   least restrictive p.o.  intake  trials of Dys.3 textures   ADL's   mod to max A overall, transfers min A, functional ambulation +2 for safety  min A overall  bilateral UE coordination, visual tracking, standing balance, attention, simple problem solving, expression of self, awareness   Mobility   min A transfers, +2 for gait  min A  awareness, balance, gait, attention   Communication   mod assist   mod assist  increase overall awareness and self monitoring   Safety/Cognition/ Behavioral Observations  mod-max assist depending on complexity of task  mod assist  increase orientation, awareness and expand attention   Pain   no complaints of pain  address pain when symprtoms noted or pt complains  monitor    Skin   bilateral abdominal incisions closed with staples/left scalp incision closed with staples(pulling staples), edges well approximated, trach site healing,no s/s of infection/breakdown  no new skin breakdown  monitor skin       *See Care Plan and progress notes for long and short-term goals.  Barriers to Discharge: cognitive, visual-spatial deficits    Possible Resolutions to Barriers:  continued therapy, family ed    Discharge Planning/Teaching Needs:  home with wife and other family to provide 24/7 assistance      Team Discussion:  Increasing insight and awareness!  Improved po intake.  Poor coordination but improving.  Vision continues to be evaluated.  Continent!   Revisions to Treatment Plan:  None at this point     Continued Need for Acute Rehabilitation Level of Care: The patient requires daily medical  management by a physician with specialized training in physical medicine and rehabilitation for the following conditions: Daily direction of a multidisciplinary physical rehabilitation program to ensure safe treatment while eliciting the highest outcome that is of practical value to the patient.: Yes Daily medical management of patient stability for increased activity during participation in  an intensive rehabilitation regime.: Yes Daily analysis of laboratory values and/or radiology reports with any subsequent need for medication adjustment of medical intervention for : Neurological problems;Other;Post surgical problems  Martie Muhlbauer 01/23/2013, 12:07 PM  Inpatient RehabilitationTeam Conference and Plan of Care Update Date: 01/23/2013   Time: 1:13 PM    Patient Name: Albert [name]      Medical Record Number: [id number]  Date of Birth: 10-Jul-1976 Sex: Male         Room/Bed: 4002/4002-01 Payor Info: Payor: BL[employer] BL[employer] / Plan: BCBS [place] PPO / Product Type: *No Product type* /    Admitting Diagnosis: rt femur fx  Admit Date/Time:  01/03/2013  3:23 PM Admission Comments: No comment available   Primary Diagnosis:  TBI (traumatic brain injury) Principal Problem: TBI (traumatic brain injury)  Patient Active Problem List   Diagnosis Date Noted  . Closed fracture of facial bones 01/03/2013  . TBI (traumatic brain injury) 01/03/2013  . Pneumonia 01/03/2013  . Acute blood loss anemia 12/30/2012  . Subdural hemorrhage following injury 12/15/2012  . Respiratory failure following trauma and surgery 12/15/2012  . Cardiac arrest 12/15/2012  . Blunt trauma of face 12/15/2012  . Blunt head trauma 12/15/2012  . Traumatic subarachnoid bleed with LOC of 30 minutes or less 12/15/2012  . Elevated ETOH level 12/15/2012    Expected Discharge Date: Expected Discharge Date: 02/07/13  Team Members Present: Physician leading conference: Dr. Faith Rogue Social Worker Present: Amada Jupiter, LCSW Nurse Present: Rosalio Macadamia, RN PT Present: Reggy Eye, PT OT Present: [name], Loistine Chance, OT;Patricia [place], OT SLP Present: Feliberto Gottron, SLP Other (Discipline and Name): Tora Duck, PPS Coordinator     Current Status/Progress Goal Weekly Team Focus  Medical   improving insight and awareness. eating well  weaning from TF. maximiize po  continued cognitive perceptual  therapies.    Bowel/Bladder   incontinent of bowel and bladder/continent with time toileting in bathroom  min assist  continue with plan of care, insure pt is clean and dry   Swallow/Nutrition/ Hydration   Dys.2 upgrade today and thin liquids   least restrictive p.o. intake  trials of Dys.3 textures   ADL's   mod to max A overall, transfers min A, functional ambulation +2 for safety  min A overall  bilateral UE coordination, visual tracking, standing balance, attention, simple problem solving, expression of self, awareness   Mobility   min A transfers, +2 for gait  min A  awareness, balance, gait, attention   Communication   mod assist   mod assist  increase overall awareness and self monitoring   Safety/Cognition/ Behavioral Observations  mod-max assist depending on complexity of task  mod assist  increase orientation, awareness and expand attention   Pain   no complaints of pain  address pain when symprtoms noted or pt complains  monitor    Skin   bilateral abdominal incisions closed with staples/left scalp incision closed with staples(pulling staples), edges well approximated, trach site healing,no s/s of infection/breakdown  no new skin breakdown  monitor skin       *See Care Plan and progress notes for long and short-term goals.  Barriers  to Discharge: cognitive, visual-spatial deficits    Possible Resolutions to Barriers:  continued therapy, family ed    Discharge Planning/Teaching Needs:  home with wife and other family to provide 24/7 assistance      Team Discussion:  Increasing in insight and awareness.  Continue to assess visual deficits. Improved P.O. Intake.  Making excellent gains.  Revisions to Treatment Plan:  None at this point   Continued Need for Acute Rehabilitation Level of Care: The patient requires daily medical management by a physician with specialized training in physical medicine and rehabilitation for the following conditions: Daily direction of a  multidisciplinary physical rehabilitation program to ensure safe treatment while eliciting the highest outcome that is of practical value to the patient.: Yes Daily medical management of patient stability for increased activity during participation in an intensive rehabilitation regime.: Yes Daily analysis of laboratory values and/or radiology reports with any subsequent need for medication adjustment of medical intervention for : Neurological problems;Other;Post surgical problems  Jamien Casanova 01/23/2013, 1:13 PM

## 2013-01-23 NOTE — Progress Notes (Signed)
Social Work Patient ID: Albert [name], male   DOB: Dec 13, 1975, 37 y.o.   MRN: [id number]  Have reviewed tean conference with pt, mother, and wife.  Family aware target d/c date remains for 02/07/13.  Will monitor progress through this next week to determine if any extension might be needed.  Plan continues to be that pt will d/c home with wife with parents to provide intermittent "relief".  Education ongoing.  Becky Dupree, LCSW to cover next week in my absence.  Kade Rickels, LCSW

## 2013-01-23 NOTE — Progress Notes (Addendum)
NUTRITION FOLLOW UP  DOCUMENTATION CODES  Per approved criteria   -Severe malnutrition in the context of acute injury   Intervention:    Add Ensure Complete po daily, each supplement provides 350 kcal and 13 grams of protein.  Continue Resource Breeze po daily, each supplement provides 250 kcal and 9 grams of protein.  Add Magic Cups to meals to provide additional kcal/protein option.  Add 30 ml Prostat via tube TID, each supplement provides 100 kcal and 15 grams protein. Current diet order does not provide adequate protein to meet pt's increased needs.  RD to continue to assess enteral tolerance  Nutrition Dx:   Inadequate oral intake now related to variable meal intake AEB variable meal completion.  Goal:   Pt to meet >/= 90% of their estimated nutrition needs. Met.  Monitor:   weight, labs, PO intake, supplement acceptance  Assessment:   Pt admitted after fall, positive for ETOH. Has SDH and SAH, multiple facial fractures, s/p decompressive craniectomy of L SDH. Pt with questionable cardiac arrest with CPR. Pt extubated 4/14, developed seizures and required second craniectomy. Pt had trach and PEG 4/22 and extubated on 4/24 and remains on trach collar. Pt has had persistent fevers this admission due to neuro storming.   Advanced to Dysphagia 3 diet on 5/14. MD discontinued bolus feedings today to stimulate appetite.  Continues with order for veil bed. Discussed oral intake with wife. Pt's intake remains variable and she recommends a variety of supplements - RD to order.  Height: Ht Readings from Last 1 Encounters:  01/03/13 6' (1.829 m)    Weight Status:   Wt Readings from Last 1 Encounters:  01/03/13 183 lb 10.3 oz (83.3 kg)  Wt variable. Wife at bedside reports usual weight PTA was around 200 - 205 lb. This is a weight loss of 16.5 % x 1 month.  Pt meets criteria for severe MALNUTRITION in the context of acute injury as evidenced by 16.5% wt loss x 1 month and  moderate muscle wasting.  Re-estimated needs:  Kcal: 2500 - 2800 Protein: at least 120 grams protein Fluid: 2.5 - 2.8 liters daily  Skin: multiple incisions  Diet Order: Dysphagia 3; thins   Intake/Output Summary (Last 24 hours) at 01/23/13 1026 Last data filed at 01/23/13 0900  Gross per 24 hour  Intake    977 ml  Output    400 ml  Net    577 ml    Last BM: 5/14   Labs:  No results found for this basename: NA, K, CL, CO2, BUN, CREATININE, CALCIUM, MG, PHOS, GLUCOSE,  in the last 168 hours  CBG (last 3)  No results found for this basename: GLUCAP,  in the last 72 hours  Scheduled Meds: . antiseptic oral rinse  15 mL Mouth Rinse QID  . baclofen  10 mg Per Tube BID  . chlorhexidine  15 mL Mouth Rinse BID  . feeding supplement  1 Container Oral Q24H  . free water  50 mL Per Tube Q12H  . levETIRAcetam  1,500 mg Per Tube BID  . methylphenidate  15 mg Per Tube BID WC  . multivitamin  5 mL Oral Daily  . pantoprazole sodium  40 mg Per Tube Daily  . phenytoin  100 mg Per Tube TID  . propranolol  80 mg Per Tube Q8H  . saccharomyces boulardii  250 mg Oral BID    Continuous Infusions: none    Jarold Motto MS, RD, LDN Pager: [phone] After-hours  pager: [phone]

## 2013-01-23 NOTE — Progress Notes (Signed)
Speech Language Pathology Daily Session Note  Patient Details  Name: Albert [name] MRN: [id number] Date of Birth: Jul 28, 1976  Today's Date: 01/23/2013 Time: 5284-1324 Time Calculation (min): 30 min  Short Term Goals: Week 3: SLP Short Term Goal 1 (Week 3): Patient will demonstrate sustained attention to task for 15 minutes with Mod assist verbal and tactile cues.   SLP Short Term Goal 2 (Week 3): Patient will follow one-step commands with Mod assist verbal and tactile cues.  SLP Short Term Goal 3 (Week 3): Patient will verbalize orientation and awarenss of deficits with Max assist clinician cues. SLP Short Term Goal 4 (Week 3): Patient will consume Dys.2 textures and thin liquids with no overt s/s of aspiration with Min-Mod clinician assist.  SLP Short Term Goal 5 (Week 3): Patient will participate in diagnostic assessment of reading comprehension abilities. SLP Short Term Goal 6 (Week 3): Patient will solve basic, familiar problems with Max assist clinician cues  Skilled Therapeutic Interventions: Skilled treatment session focused on addressing cognition goals during a basic, familiar self-care task.  SLP facilitated session with min assist cues to problem solve toileting while seated and mod-max assist to problem solve while up on feet walking in and out of bathroom.  Patient demonstrated decreased neologisms today with min assist cues to clarify some verbal expression.     FIM:  Comprehension Comprehension Mode: Auditory Comprehension: 4-Understands basic 75 - 89% of the time/requires cueing 10 - 24% of the time Expression Expression Mode: Verbal Expression: 4-Expresses basic 75 - 89% of the time/requires cueing 10 - 24% of the time. Needs helper to occlude trach/needs to repeat words. Social Interaction Social Interaction: 4-Interacts appropriately 75 - 89% of the time - Needs redirection for appropriate language or to initiate interaction. Problem Solving Problem Solving: 3-Solves  basic 50 - 74% of the time/requires cueing 25 - 49% of the time Memory Memory: 2-Recognizes or recalls 25 - 49% of the time/requires cueing 51 - 75% of the time FIM - Eating Eating Activity: 4: Helper occasionally scoops food on utensil;4: Help with picking up utensils;4: Help with managing cup/glass  Pain Pain Assessment Pain Assessment: No/denies pain  Therapy/Group: Individual Therapy  Charlane Ferretti., CCC-SLP [phone]  Lottie Sigman 01/23/2013, 3:41 PM

## 2013-01-23 NOTE — Progress Notes (Signed)
Occupational Therapy Session Note  Patient Details  Name: Albert [name] MRN: [id number] Date of Birth: 1976/07/01  Today's Date: 01/23/2013 Time: 0830-0930 Time Calculation (min): 60 min  Short Term Goals: Week 2:  OT Short Term Goal 1 (Week 2): pt will perform 3/4 grooming tasks with max verbal cues for initiation OT Short Term Goal 2 (Week 2): pt will perform UB dressing with min A  OT Short Term Goal 3 (Week 2): pt will thread BLE into pants legs with min A  OT Short Term Goal 4 (Week 2): pt will perform UB bathing with min A  Skilled Therapeutic Interventions/Progress Updates:    1:1 self care retraining cluding toilet transfers, toileting sit to stand with only steady A and extra time, short distance functional ambulation from toilet to shower with min A with use of grab bar with only 1 caregiver, sit to stand, procedural reasoning, taks sequencing and organization with extra time and questioning cues int eh context of environment, dressing with correct orientation of clothing for setup and A to process legs for successfully threading pants on right LE. Engaged in grooming and self feeding breakfast- Pt challenged by decreased vision in lower field and to the right. Pt did need A to scoop food onto spoon   Therapy Documentation Precautions:  Precautions Precautions: Fall Precaution Comments: L crani, skull cap removed  and in abdomen Required Braces or Orthoses: Other Brace/Splint Other Brace/Splint: abdominal binder Restrictions Weight Bearing Restrictions: No Pain:  no c/o pain  See FIM for current functional status  Therapy/Group: Individual Therapy  [name] [facility] 01/23/2013, 11:01 AM

## 2013-01-23 NOTE — Progress Notes (Signed)
Recreational Therapy Session Note  Patient Details  Name: ALGENIS BALLIN MRN: [id number] Date of Birth: 1976/07/25 Today's Date: 01/23/2013 Time:  06-1029 Pain: no c/o Skilled Therapeutic Interventions/Progress Updates: Repeated activity from yesterday's session... session focused on dynamic standing balance, selective attention, memory, visual scanning, color recognition, following commands during "[employer]. Pt able to recall a sequence up to 5 colors with min verbal cues and mod instructional cues & gestures to identify the correct colored square. Pt required +2 assist for safety, pt = ~70%. Pt attended to task ~6 minutes per repetition before needing seated rest break. Pt ambulated with HHA +2 for safety >300 feet while naming items in a category, using last letter of previous word to begin the next word.  Pt independently able to identify last letter in previous word, but required max verbal & questioning cues to name an item beginning with that letter.   [name] present & observing session.  Therapy/Group: Co-Treatment  Geroge Gilliam 01/23/2013, 11:52 AM

## 2013-01-23 NOTE — Progress Notes (Signed)
Physical Therapy Note  Patient Details  Name: Albert [name] MRN: [id number] Date of Birth: [birth year] Today's Date: 01/23/2013  Time 1: 1000-1100 60 minutes (30 minutes co-tx with TR)  1:1 no c/o pain.  Standing "simon" game with 4 different color squares.  Pt able to tap with min - mod A for standing balance, able to recall 4-5 sequence patterns with min A to identify colors.  Pt able to create a sequence and then perform with min-mod cuing.  Gait training with naming items with max A to name items, no assist needed to spell words, +2 for safety with gait.  Standing golf putting game with mod A for balance, improved hand/eye coordination.  Pt continues to improve awareness and coordination.   Time 2: 1300-1400 60 minutes  1:1 No c/o pain.  Gait training with +2 assist increased distance (> 150') with min-mod A for balance.  Pt able to control R LE without physical assist, decreased adduction tone.  Kitchen task of putting groceries aware for attention, awareness, balance and B UE coordination and control.  Pt requires min cuing to attend and find items in R visual field, min hand over hand at times for R UE coordination.  Min A for standing balance at counter.  Pt engaged in word finding activities with mod-max semantic and phonemic cues.  Played "black jack" card game for problem solving and attention with pt able to identify cards in L visual field accurately >75% of time, required min-mod cuing for adding and playing game.  Bathroom transfers with grab bar min A.  Hurley Sobel 01/23/2013, 11:00 AM

## 2013-01-23 NOTE — Progress Notes (Signed)
Patient ID: Albert [name], male   DOB: 06/28/1976, 37 y.o.   MRN: [id number] Subjective/Complaints: Rested well. No new complaints. Nurses removed staples! A 12 point review of systems has been performed and if not noted above is otherwise negative.    Objective: Vital Signs: Blood pressure 136/87, pulse 66, temperature 98.3 F (36.8 C), temperature source Oral, resp. rate 18, height 6' (1.829 m), weight 81.5 kg (179 lb 10.8 oz), SpO2 98.00%. No results found. No results found for this basename: WBC, HGB, HCT, PLT,  in the last 72 hours No results found for this basename: NA, K, CL, CO, GLUCOSE, BUN, CREATININE, CALCIUM,  in the last 72 hours CBG (last 3)  No results found for this basename: GLUCAP,  in the last 72 hours  Wt Readings from Last 3 Encounters:  01/03/13 83.3 kg (183 lb 10.3 oz)  01/03/13 83.3 kg (183 lb 10.3 oz)  01/03/13 83.3 kg (183 lb 10.3 oz)    Physical Exam:  Eyes:  Pupils reactive to light  Neck: Neck supple. No thyromegaly present.  Cardiovascular: Normal rate and regular rhythm.  Pulmonary/Chest: Effort normal and breath sounds normal. No respiratory distress.  Abdominal: Soft. Bowel sounds are normal. He exhibits no distension.  Neurological:     RUE tone still is trace to 1. RUE is grossly 4/5. RLE is 4 prox to 2-3/5 at ankle. Senses pain on right. DTR's 3+. Answers simple questions. Limited insight and awareness. Makes good eye contact but attention still limited. Appears have visual field deficits, but inconsistent on exam.  Skin:  Craniotomy site without drainage.   Peg and donor sites on abdomen are intact with only minimal drainage--staples all are out.   Assessment/Plan: 1. 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 Physiatrist is providing close team supervision and 24 hour management of active medical  problems listed below. Physiatrist and rehab team continue to assess barriers to discharge/monitor patient progress toward functional and medical goals.  Continued visual deficits. Difficult to pinpoint on exam. As cognition improves we can further localize. Will need formal neuro-optho eval after dc FIM: FIM - Bathing Bathing Steps Patient Completed: Chest;Right Arm;Left Arm;Abdomen;Front perineal area;Right upper leg;Left upper leg Bathing: 3: Mod-Patient completes 5-7 36f 10 parts or 50-74%  FIM - Upper Body Dressing/Undressing Upper body dressing/undressing steps patient completed: Thread/unthread left sleeve of pullover shirt/dress;Put head through opening of pull over shirt/dress;Pull shirt over trunk Upper body dressing/undressing: 4: Min-Patient completed 75 plus % of tasks FIM - Lower Body Dressing/Undressing Lower body dressing/undressing steps patient completed: Pull pants up/down Lower body dressing/undressing: 2: Max-Patient completed 25-49% of tasks  FIM - Toileting Toileting steps completed by patient: Performs perineal hygiene Toileting: 2: Max-Patient completed 1 of 3 steps  FIM - Diplomatic Services operational officer Devices: Grab bars Toilet Transfers: 1-Two helpers  FIM - Banker Devices: Bed rails;Arm rests Bed/Chair Transfer: 3: Chair or W/C > Bed: Mod A (lift or lower assist)  FIM - Locomotion: Wheelchair Locomotion: Wheelchair: 1: Total Assistance/staff pushes wheelchair (Pt<25%) FIM - Locomotion: Ambulation Locomotion: Ambulation Assistive Devices: Other (comment) (Bilateral HHA) Ambulation/Gait Assistance: 1: +2 Total assist Locomotion: Ambulation: 1: Two helpers  Comprehension Comprehension Mode: Auditory Comprehension: 3-Understands basic 50 - 74% of the time/requires cueing 25 - 50%  of the time  Expression Expression Mode: Verbal Expression Assistive Devices: 6-Other (Comment) Expression:  3-Expresses  basic 50 - 74% of the time/requires cueing 25 - 50% of the time. Needs to repeat parts of sentences.  Social Interaction Social Interaction Mode: Asleep Social Interaction: 3-Interacts appropriately 50 - 74% of the time - May be physically or verbally inappropriate.  Problem Solving Problem Solving: 3-Solves basic 50 - 74% of the time/requires cueing 25 - 49% of the time  Memory Memory: 2-Recognizes or recalls 25 - 49% of the time/requires cueing 51 - 75% of the time  Medical Problem List and Plan:  1. 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 Status post craniotomy evacuation hematoma with insertion of bone flap abdominal left upper quadrant 12/15/2012 with revision after midline shift 12/28/2012 ---RLAS V at this point 2. DVT Prophylaxis/Anticoagulation: SCDs. Dopplers negative 3. Numerous facial fractures. Conservative care per ENT  4. Mood: Ativan as needed, Inderal 80 mg every 8 hours.  -re-establish sleep cycle/ sleep chart.  -ritalin- continue for attention -enclosure bed still required 5. Neuropsych: This patient is not capable of making decisions on his own behalf.  6. Seizure disorder. Vimpat--taper to 50mg  bid--dc today continue Keppra, Dilantin.    7. Dysphagia/VDRF. Gastrostomy PEG tube/trach tube 12/31/2012 per trauma services.  -trach stoma healing nicely  -diet D3  -dc TF and H2O 8. Alcohol abuse. Counseling  9. Wound care: local care to PEG, crani, and abdominal sites.  -local care-- -helmet at some point 10. Temp: afebrile, stable leukocytosis -urine culture and cxr negative, dopplers negative. 11. Spasticity:   low dose baclofen  -continued therapy, engagement of right side--improving 12. Urinary frequency  -timed voids, bladder education    LOS (Days) 20 A FACE TO FACE EVALUATION WAS PERFORMED  Albert [name] T 01/23/2013 7:56 AM

## 2013-01-23 NOTE — Progress Notes (Signed)
Speech Language Pathology Daily Session Note  Patient Details  Name: Albert [name] MRN: [id number] Date of Birth: 1976-03-11  Today's Date: 01/23/2013 Time: 1130-1145 Time Calculation (min): 15 min  Short Term Goals: Week 3: SLP Short Term Goal 1 (Week 3): Patient will demonstrate sustained attention to task for 15 minutes with Mod assist verbal and tactile cues.   SLP Short Term Goal 2 (Week 3): Patient will follow one-step commands with Mod assist verbal and tactile cues.  SLP Short Term Goal 3 (Week 3): Patient will verbalize orientation and awarenss of deficits with Max assist clinician cues. SLP Short Term Goal 4 (Week 3): Patient will consume Dys.2 textures and thin liquids with no overt s/s of aspiration with Min-Mod clinician assist.  SLP Short Term Goal 5 (Week 3): Patient will participate in diagnostic assessment of reading comprehension abilities. SLP Short Term Goal 6 (Week 3): Patient will solve basic, familiar problems with Max assist clinician cues  Skilled Therapeutic Interventions: Skilled co-treatment session focused on addressing safety with self-feeding.  Patient consumed Dys.3 textures and thin liquids with min physical assist and min-mod assist verbal cues to problem solve self-feeding due to visual-perceptual deficits.  Patient exhibited no overt s/s of aspiration and as a result it is recommended to continue with the current plan of care.  Wife present for session and return demonstrated ability to assist and cue patient; ok to assist with meals.   FIM:  Comprehension Comprehension Mode: Auditory Comprehension: 4-Understands basic 75 - 89% of the time/requires cueing 10 - 24% of the time Expression Expression Mode: Verbal Expression: 4-Expresses basic 75 - 89% of the time/requires cueing 10 - 24% of the time. Needs helper to occlude trach/needs to repeat words. Social Interaction Social Interaction: 4-Interacts appropriately 75 - 89% of the time - Needs redirection  for appropriate language or to initiate interaction. Problem Solving Problem Solving: 3-Solves basic 50 - 74% of the time/requires cueing 25 - 49% of the time Memory Memory: 2-Recognizes or recalls 25 - 49% of the time/requires cueing 51 - 75% of the time FIM - Eating Eating Activity: 4: Helper occasionally scoops food on utensil;4: Help with picking up utensils;4: Help with managing cup/glass  Pain Pain Assessment Pain Assessment: No/denies pain  Therapy/Group: Individual Therapy  Charlane Ferretti., CCC-SLP [phone]  Brookelynne Dimperio 01/23/2013, 12:16 PM

## 2013-01-23 NOTE — Progress Notes (Signed)
All staples d/c'd from bilateral abdominal incisions this morning. Pt tolerated procedure fairly well. Staples also d/c'd from scalp incision on previous shift. Mick Sell, RN

## 2013-01-23 NOTE — Progress Notes (Signed)
Occupational Therapy Note  Patient Details  Name: Albert [name] MRN: [id number] Date of Birth: March 09, 1976 Today's Date: 01/23/2013  Time: 1145-1200 (cotx with Speech therapy-total time 1130-12000) Pt denies pain Individual Therapy  Pt engaged is self feeding with focus on task initiation, attention to task, utensil use, and compensatory strategies.  Pt continues to exhibit difficulty locating items on table and difficulty with motor planning.  Pt exhibited frustration with difficulties but encouraged by wife throughout session.    Ladell, Lea [facility] 01/23/2013, 3:43 PM

## 2013-01-24 ENCOUNTER — Inpatient Hospital Stay (HOSPITAL_COMMUNITY): Payer: BC Managed Care – PPO | Admitting: Occupational Therapy

## 2013-01-24 ENCOUNTER — Inpatient Hospital Stay (HOSPITAL_COMMUNITY): Payer: BC Managed Care – PPO | Admitting: Physical Therapy

## 2013-01-24 ENCOUNTER — Inpatient Hospital Stay (HOSPITAL_COMMUNITY): Payer: BC Managed Care – PPO | Admitting: Speech Pathology

## 2013-01-24 ENCOUNTER — Encounter (HOSPITAL_COMMUNITY): Payer: BC Managed Care – PPO | Admitting: Occupational Therapy

## 2013-01-24 DIAGNOSIS — S069X9A Unspecified intracranial injury with loss of consciousness of unspecified duration, initial encounter: Secondary | ICD-10-CM

## 2013-01-24 NOTE — Progress Notes (Signed)
Occupational Therapy Session Note  Patient Details  Name: Albert [name] MRN: [id number] Date of Birth: 1975/10/22  Today's Date: 01/24/2013 Time: 1330-1400 Time Calculation (min): 30 min  Short Term Goals: Week 2:  OT Short Term Goal 1 (Week 2): pt will perform 3/4 grooming tasks with max verbal cues for initiation OT Short Term Goal 2 (Week 2): pt will perform UB dressing with min A  OT Short Term Goal 3 (Week 2): pt will thread BLE into pants legs with min A  OT Short Term Goal 4 (Week 2): pt will perform UB bathing with min A  Skilled Therapeutic Interventions/Progress Updates:    1:1 co treat with Physical therapy Side stepping laterally holding railing with min A, side step holding therapy ball with mod-max A for balance reactions. Functional ambulation with trial of RW with pt able to walk with +1 assist, requires mod-max A due to needing assist for RW control due to visual and perceptual deficits. RW in bathroom for mobility for transfer to toilet and toileting sit to stand with +1 mod-max A. Self feeding with focus on attention to task, bilateral coordination problem solving and word finding with max cues for word finding, supervision-min A for attention due to fatigue.   Therapy Documentation Precautions:  Precautions Precautions: Fall Precaution Comments: L crani, skull cap removed  and in abdomen Required Braces or Orthoses: Other Brace/Splint Other Brace/Splint: abdominal binder Restrictions Weight Bearing Restrictions: No Pain: Pain Assessment Pain Assessment: No/denies pain  See FIM for current functional status  Therapy/Group: Individual Therapy  [name] [facility] 01/24/2013, 2:58 PM

## 2013-01-24 NOTE — Progress Notes (Signed)
Occupational Therapy Session Note  Patient Details  Name: Albert [name] MRN: [id number] Date of Birth: 01/08/1976  Today's Date: 01/24/2013 Time: 5284-1324 Time Calculation (min): 45 min  Short Term Goals: Week 2:  OT Short Term Goal 1 (Week 2): pt will perform 3/4 grooming tasks with max verbal cues for initiation OT Short Term Goal 2 (Week 2): pt will perform UB dressing with min A  OT Short Term Goal 3 (Week 2): pt will thread BLE into pants legs with min A  OT Short Term Goal 4 (Week 2): pt will perform UB bathing with min A  Skilled Therapeutic Interventions/Progress Updates:    1:1 self care retraining at shower level with focus on basic transfers (toilet, shower chair), task sequencing, sit to stand, standing balance, visual scanning, self awareness of deficits and mistakes in language/ memory, orientation. Pt agreed "I know" with information of being in hospital due to falling and hurting his head - difficult for him to say due to language deficits. Pt appears more fatigued today and requested more Assistance with dressing. Self feeding breakfast with focus on instructing OT how to better help him setup and visually locate food on plate   Therapy Documentation Precautions:  Precautions Precautions: Fall Precaution Comments: L crani, skull cap removed  and in abdomen Required Braces or Orthoses: Other Brace/Splint Other Brace/Splint: abdominal binder Restrictions Weight Bearing Restrictions: No Pain: Pain Assessment Pain Assessment: No/denies pain  See FIM for current functional status  Therapy/Group: Individual Therapy  [name] [facility] 01/24/2013, 12:47 PM

## 2013-01-24 NOTE — Progress Notes (Signed)
Speech Language Pathology Daily Session Note  Patient Details  Name: Albert [name] MRN: [id number] Date of Birth: [birth year]  Today's Date: 01/24/2013 Time: 0915-1000 Time Calculation (min): 45 min  Short Term Goals: Week 3: SLP Short Term Goal 1 (Week 3): Patient will demonstrate sustained attention to task for 15 minutes with Mod assist verbal and tactile cues.   SLP Short Term Goal 2 (Week 3): Patient will follow one-step commands with Mod assist verbal and tactile cues.  SLP Short Term Goal 3 (Week 3): Patient will verbalize orientation and awarenss of deficits with Max assist clinician cues. SLP Short Term Goal 4 (Week 3): Patient will consume Dys.2 textures and thin liquids with no overt s/s of aspiration with Min-Mod clinician assist.  SLP Short Term Goal 5 (Week 3): Patient will participate in diagnostic assessment of reading comprehension abilities. SLP Short Term Goal 6 (Week 3): Patient will solve basic, familiar problems with Max assist clinician cues  Skilled Therapeutic Interventions: Skilled treatment session focused on cognitive recovery with focus on selective attention to SLP directed task. Patient selected attention to card game with 2 variables for ~10 minutes with Max faded to Mod assist cues to follow steps to complete task. Patient was able to progress to comparing 2 cards to determine if they were a match.  During structured activity language appeared more appropriate with significantly reduced language of confusion.    FIM:  Comprehension Comprehension Mode: Auditory Comprehension: 4-Understands basic 75 - 89% of the time/requires cueing 10 - 24% of the time Expression Expression Mode: Verbal Expression: 4-Expresses basic 75 - 89% of the time/requires cueing 10 - 24% of the time. Needs helper to occlude trach/needs to repeat words. Social Interaction Social Interaction: 4-Interacts appropriately 75 - 89% of the time - Needs redirection for appropriate language or  to initiate interaction. Problem Solving Problem Solving: 3-Solves basic 50 - 74% of the time/requires cueing 25 - 49% of the time Memory Memory: 2-Recognizes or recalls 25 - 49% of the time/requires cueing 51 - 75% of the time FIM - Eating Eating Activity: 1: Helper feeds patient  Pain Pain Assessment Pain Assessment: No/denies pain  Therapy/Group: Individual Therapy  Charlane Ferretti., CCC-SLP [phone]  Armine Rizzolo 01/24/2013, 4:14 PM

## 2013-01-24 NOTE — Progress Notes (Signed)
Speech Language Pathology Daily Session Note  Patient Details  Name: Albert [name] MRN: [id number] Date of Birth: 1975-11-15  Today's Date: 01/24/2013 Time: 1130-1200 Time Calculation (min): 30 min  Short Term Goals: Week 3: SLP Short Term Goal 1 (Week 3): Patient will demonstrate sustained attention to task for 15 minutes with Mod assist verbal and tactile cues.   SLP Short Term Goal 2 (Week 3): Patient will follow one-step commands with Mod assist verbal and tactile cues.  SLP Short Term Goal 3 (Week 3): Patient will verbalize orientation and awarenss of deficits with Max assist clinician cues. SLP Short Term Goal 4 (Week 3): Patient will consume Dys.2 textures and thin liquids with no overt s/s of aspiration with Min-Mod clinician assist.  SLP Short Term Goal 5 (Week 3): Patient will participate in diagnostic assessment of reading comprehension abilities. SLP Short Term Goal 6 (Week 3): Patient will solve basic, familiar problems with Max assist clinician cues  Skilled Therapeutic Interventions: Group, co-treatment session with OT to focus on safety with swallowing and self-feeding. SLP facilitated session with Dys.3 textures with total assist; clinician fed patient ~5% of tray.  Patient refused any further p.o. and all thin liquids.  Patient demonstrated no overt s/s of aspiration throughout meal. Continue with current plan of care.    FIM:  FIM - Eating Eating Activity: 1: Helper feeds patient  Pain Pain Assessment Pain Assessment: No/denies pain  Therapy/Group: Group Therapy  Charlane Ferretti., CCC-SLP [phone]  Janelis Stelzer 01/24/2013, 12:35 PM

## 2013-01-24 NOTE — Progress Notes (Signed)
Occupational Therapy Note  Patient Details  Name: [name] MRN: [id number] Date of Birth: 1976/08/07 Today's Date: 01/24/2013  Time: 1200-1215 (cotx with Speech Therapy-total time [phone])  Pt participated in self feeding group with focus on stabbing food and bringing food to mouth.  Pt continues to demonstrate difficulty locating items on place and utensil use.  Pt stated numerous times that he wasn't hungry required max encouragement throughout meal.  Pt ate approx 10% of meal and refused any attempts to assist with eating.   Jesiel, Garate [facility] 01/24/2013, 3:19 PM

## 2013-01-24 NOTE — Progress Notes (Signed)
Occupational Therapy Session Note  Patient Details  Name: Albert [name] MRN: [id number] Date of Birth: 1976/03/06  Today's Date: 01/24/15 Time: 1100-1130 Time Calculation (min): 30 min  Short Term Goals: Week 2:  OT Short Term Goal 1 (Week 2): pt will perform 3/4 grooming tasks with max verbal cues for initiation OT Short Term Goal 2 (Week 2): pt will perform UB dressing with min A  OT Short Term Goal 3 (Week 2): pt will thread BLE into pants legs with min A  OT Short Term Goal 4 (Week 2): pt will perform UB bathing with min A  Skilled Therapeutic Interventions/Progress Updates:  Patient's wife present and participating as well as observing in therapy session.  Engaged in sit><stand, standing tolerance, wash hands at sink while standing, visually scanning/locating items/objects on and around the sink needed to complete the task then using BUEs with reaching and eye/coordination.  Tabletop task to also address visual scanning and eye hand coordination and simple addition in his head.   Therapy Documentation Precautions:  Precautions Precautions: Fall Precaution Comments: L crani, skull cap removed  and in abdomen Required Braces or Orthoses: Other Brace/Splint Other Brace/Splint: abdominal binder Restrictions Weight Bearing Restrictions: No Pain: Pain Assessment Pain Assessment: No/denies pain ADL: See FIM for current functional status  Therapy/Group: Individual Therapy  Lasha Echeverria 01/24/2013, 4:28 PM

## 2013-01-24 NOTE — Progress Notes (Signed)
Occupational Therapy Session Note  Patient Details  Name: Albert [name] MRN: [id number] Date of Birth: 1976-08-30  Today's Date: 01/24/2013 Time: 1191-4782 Time Calculation (min): 45 min  Short Term Goals: Week 2:  OT Short Term Goal 1 (Week 2): pt will perform 3/4 grooming tasks with max verbal cues for initiation OT Short Term Goal 2 (Week 2): pt will perform UB dressing with min A  OT Short Term Goal 3 (Week 2): pt will thread BLE into pants legs with min A  OT Short Term Goal 4 (Week 2): pt will perform UB bathing with min A  Skilled Therapeutic Interventions/Progress Updates:  Patient engaged in tabletop task to address visual scanning, eye/hand coordination, and simple cognition.  Patient's visual field is difficult to fully assess at this time.  During functional tasks, a consistant pattern is not yet present.  Generally, locating items to the right of midline requires min verbal cues and the right lower quadrant often required mod multi-modal cues.  Patient able to complete simple math-addition 75% of the time without cues, yet unable to state the name of the simple card game and able to only verbalize 1 of the rules despite numerous attempts/methods to assist with recall.  Patient performed sit><stand, standing tolerance/balance while sliding object on table forward-back, right-left with big movements while using his right hand.  Patient demonstrates slight resistance with big reaching movements to right yet became more comfortable with practice.  Patient visiting with his parents with QRB in place.  Therapy Documentation Precautions:  Precautions Precautions: Fall Precaution Comments: L crani, skull cap removed  and in abdomen Required Braces or Orthoses: Other Brace/Splint Other Brace/Splint: abdominal binder Restrictions Weight Bearing Restrictions: No Pain: Pain Assessment Pain Assessment: No/denies pain  Therapy/Group: Individual Therapy  Vear Staton 01/24/2013,  4:50 PM

## 2013-01-24 NOTE — Progress Notes (Signed)
Patient ID: Albert [name], male   DOB: 08/07/1976, 37 y.o.   MRN: [id number] Subjective/Complaints: Awake, calm. No distress. A 12 point review of systems has been performed and if not noted above is otherwise negative.    Objective: Vital Signs: Blood pressure 127/91, pulse 65, temperature 98.6 F (37 C), temperature source Oral, resp. rate 19, height 6' (1.829 m), weight 81.5 kg (179 lb 10.8 oz), SpO2 98.00%. No results found. No results found for this basename: WBC, HGB, HCT, PLT,  in the last 72 hours No results found for this basename: NA, K, CL, CO, GLUCOSE, BUN, CREATININE, CALCIUM,  in the last 72 hours CBG (last 3)  No results found for this basename: GLUCAP,  in the last 72 hours  Wt Readings from Last 3 Encounters:  01/03/13 83.3 kg (183 lb 10.3 oz)  01/03/13 83.3 kg (183 lb 10.3 oz)  01/03/13 83.3 kg (183 lb 10.3 oz)    Physical Exam:  Eyes:  Pupils reactive to light  Neck: Neck supple. No thyromegaly present.  Cardiovascular: Normal rate and regular rhythm.  Pulmonary/Chest: Effort normal and breath sounds normal. No respiratory distress.  Abdominal: Soft. Bowel sounds are normal. He exhibits no distension.  Neurological:     RUE tone still is trace to 1. RUE is grossly 4/5. RLE is 4 prox to 2-3/5 at ankle. Senses pain on right. DTR's 3+. Answers simple questions. Limited insight and awareness. Makes good eye contact but attention still limited. Appears have visual field deficits, but inconsistent on exam. I'm in [place]. Can't tell me where. Recalls wife's name Skin:  Craniotomy site without drainage.   Peg and donor sites on abdomen are intact with only minimal drainage--staples all are out.   Assessment/Plan: 1. 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 Physiatrist is providing close team supervision and 24 hour management of  active medical problems listed below. Physiatrist and rehab team continue to assess barriers to discharge/monitor patient progress toward functional and medical goals.  Continued visual deficits. Difficult to pinpoint on exam. As cognition improves we can further localize. Will need formal neuro-optho eval after dc FIM: FIM - Bathing Bathing Steps Patient Completed: Chest;Right Arm;Left Arm;Abdomen;Front perineal area;Right upper leg;Left upper leg Bathing: 3: Mod-Patient completes 5-7 47f 10 parts or 50-74%  FIM - Upper Body Dressing/Undressing Upper body dressing/undressing steps patient completed: Thread/unthread left sleeve of pullover shirt/dress;Put head through opening of pull over shirt/dress;Pull shirt over trunk;Thread/unthread right sleeve of pullover shirt/dresss Upper body dressing/undressing: 4: Steadying assist FIM - Lower Body Dressing/Undressing Lower body dressing/undressing steps patient completed: Pull pants up/down;Thread/unthread right pants leg;Thread/unthread left pants leg;Don/Doff right sock;Don/Doff left sock Lower body dressing/undressing: 3: Mod-Patient completed 50-74% of tasks  FIM - Toileting Toileting steps completed by patient: Performs perineal hygiene;Adjust clothing after toileting Toileting: 3: Mod-Patient completed 2 of 3 steps  FIM - Diplomatic Services operational officer Devices: Grab bars Toilet Transfers: 4-To toilet/BSC: Min A (steadying Pt. > 75%);4-From toilet/BSC: Min A (steadying Pt. > 75%)  FIM - Bed/Chair Transfer Bed/Chair Transfer Assistive Devices: Bed rails;Arm rests Bed/Chair Transfer: 4: Chair or W/C > Bed: Min A (steadying Pt. > 75%);4: Bed > Chair or W/C: Min A (steadying Pt. > 75%) (Simultaneous filing. User may not have seen previous data.)  FIM - Locomotion: Wheelchair Locomotion: Wheelchair: 1: Total Assistance/staff pushes wheelchair (Pt<25%) FIM - Locomotion: Ambulation Locomotion:  Ambulation Assistive Devices: Other  (comment) (Bilateral HHA) Ambulation/Gait Assistance: 1: +2 Total assist Locomotion: Ambulation: 1: Two helpers  Comprehension Comprehension Mode: Auditory Comprehension: 4-Understands basic 75 - 89% of the time/requires cueing 10 - 24% of the time  Expression Expression Mode: Verbal Expression Assistive Devices: 6-Other (Comment) Expression: 4-Expresses basic 75 - 89% of the time/requires cueing 10 - 24% of the time. Needs helper to occlude trach/needs to repeat words.  Social Interaction Social Interaction Mode: Asleep Social Interaction: 4-Interacts appropriately 75 - 89% of the time - Needs redirection for appropriate language or to initiate interaction.  Problem Solving Problem Solving: 3-Solves basic 50 - 74% of the time/requires cueing 25 - 49% of the time  Memory Memory: 2-Recognizes or recalls 25 - 49% of the time/requires cueing 51 - 75% of the time  Medical Problem List and Plan:  1. 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 Status post craniotomy evacuation hematoma with insertion of bone flap abdominal left upper quadrant 12/15/2012 with revision after midline shift 12/28/2012 ---RLAS V at this point 2. DVT Prophylaxis/Anticoagulation: SCDs. Dopplers negative 3. Numerous facial fractures. Conservative care per ENT  4. Mood: Ativan as needed, Inderal 80 mg every 8 hours.  -re-establish sleep cycle/ sleep chart.  -ritalin- continue for attention -enclosure bed still required 5. Neuropsych: This patient is not capable of making decisions on his own behalf.  6. Seizure disorder. Vimpat--taper to 50mg  bid--dc today continue Keppra, Dilantin.    7. Dysphagia. Gastrostomy PEG tube/ 12/31/2012 per trauma services.  -trach stoma healed  -diet D3  -off TF and H2O flushes except for maintenance 8. Alcohol abuse. Counseling  9. Wound care: local care to PEG, crani, and abdominal sites.  -local care-- -helmet at some point 10. Temp:  afebrile, stable leukocytosis -urine culture and cxr negative, dopplers negative. 11. Spasticity:   low dose baclofen has helped  -continued therapy, engagement of right side 12. Urinary frequency  -timed voids, bladder education    LOS (Days) 21 A FACE TO FACE EVALUATION WAS PERFORMED  Selia Wareing T 01/24/2013 7:35 AM

## 2013-01-24 NOTE — Progress Notes (Signed)
Physical Therapy Note  Patient Details  Name: Albert [name] MRN: [id number] Date of Birth: June 16, 1976 Today's Date: 01/24/2013  Time 1: 828-913 45 minutes  1:1 No c/o pain.  Standing balance training with BUE tasks for hockey and basketball games.  Pt with much improved static standing balance, min A without UE support.  Pt with improved UE coordination, able to shoot basketball and maneuver hockey stick well.  Gait with +2 HHA with light min A.  Seated "war" card game with pt able to identify cards and appropriately play game with cuing < 10% of the time.  Pt reports "I'm tired" throughout session, given frequent mental rest breaks.   Time 2: 1300-1400 60 minutes co-tx with OT  1:1  Side stepping laterally holding railing with min A, side step holding therapy ball with mod-max A for balance reactions.  Gait training with trial of RW with pt able to gait with +1 assist, requires mod-max A due to needing assist for RW control due to visual and perceptual deficits.  RW in bathroom for mobility with +1 mod-max A.  Self feeding with focus on attention to task, problem solving and word finding with max cues for word finding, supervision-min A for attention due to fatigue. [name] 01/24/2013, 9:12 AM

## 2013-01-25 ENCOUNTER — Inpatient Hospital Stay (HOSPITAL_COMMUNITY): Payer: BC Managed Care – PPO

## 2013-01-25 ENCOUNTER — Inpatient Hospital Stay (HOSPITAL_COMMUNITY): Payer: BC Managed Care – PPO | Admitting: Physical Therapy

## 2013-01-25 NOTE — Progress Notes (Signed)
Speech Language Pathology Daily Session Note  Patient Details  Name: Albert [name] MRN: [id number] Date of Birth: [birth year]  Today's Date: 01/25/2013 Time: 1130-1205 Time Calculation (min): 35 min  Short Term Goals: Week 3: SLP Short Term Goal 1 (Week 3): Patient will demonstrate sustained attention to task for 15 minutes with Mod assist verbal and tactile cues.   SLP Short Term Goal 2 (Week 3): Patient will follow one-step commands with Mod assist verbal and tactile cues.  SLP Short Term Goal 3 (Week 3): Patient will verbalize orientation and awarenss of deficits with Max assist clinician cues. SLP Short Term Goal 4 (Week 3): Patient will consume Dys.2 textures and thin liquids with no overt s/s of aspiration with Min-Mod clinician assist.  SLP Short Term Goal 5 (Week 3): Patient will participate in diagnostic assessment of reading comprehension abilities. SLP Short Term Goal 6 (Week 3): Patient will solve basic, familiar problems with Max assist clinician cues  Skilled Therapeutic Interventions: Group, co-treatment session with OT to focus on safety with swallowing and self-feeding. SLP facilitated session with trials of Dys 3 and thin liquid textures. Patient demonstrated no overt s/s of aspiration throughout meal. Continue with current plan of care.     FIM:  Comprehension Comprehension Mode: Auditory Comprehension: 4-Understands basic 75 - 89% of the time/requires cueing 10 - 24% of the time Expression Expression Mode: Verbal Expression Assistive Devices: 6-Other (Comment) Expression: 3-Expresses basic 50 - 74% of the time/requires cueing 25 - 50% of the time. Needs to repeat parts of sentences. Social Interaction Social Interaction: 4-Interacts appropriately 75 - 89% of the time - Needs redirection for appropriate language or to initiate interaction. Problem Solving Problem Solving: 3-Solves basic 50 - 74% of the time/requires cueing 25 - 49% of the time Memory Memory:  2-Recognizes or recalls 25 - 49% of the time/requires cueing 51 - 75% of the time FIM - Eating Eating Activity: 1: Helper feeds patient  Pain Pain Assessment Pain Assessment: 0-10 Pain Score:   5 Pain Location: Generalized Pain Descriptors: Aching Pain Intervention(s): Medication (See eMAR)  Therapy/Group: Group Therapy  Maxcine Ham 01/25/2013, 4:15 PM  Maxcine Ham, M.A. CCC-SLP

## 2013-01-25 NOTE — Plan of Care (Signed)
Problem: RH PAIN MANAGEMENT Goal: RH STG PAIN MANAGED AT OR BELOW PT'S PAIN GOAL Pain managed with prn medications :<3. Observe for non-verbal S/S pain.  Outcome: Progressing No S/S of and denies pain.

## 2013-01-25 NOTE — Progress Notes (Signed)
Occupational Therapy Session Note  Patient Details  Name: Albert [name] MRN: [id number] Date of Birth: March 09, 1976  Today's Date: 01/25/2013 Time: 1205-1240 Time Calculation (min): 35 min  Skilled Therapeutic Interventions/Progress Updates: "[name]" required assist to plan, intitiate and attend to self feeding and supervision to mod A to scoop and place fork at mouth.    Patient stated he did not like the food and overall ate about 10% of his meal.    He overshot and knocked over juice glass x1 and knocked over his Ensure drink x 1.    His mother came at end of session with broccoli cheese soup and was to consume about 50% of the approx 8 oz serving     Therapy Documentation Precautions:  Precautions Precautions: Fall Precaution Comments: L crani, skull cap removed  and in abdomen Required Braces or Orthoses: Other Brace/Splint Other Brace/Splint: abdominal binder Restrictions Weight Bearing Restrictions: No Therapy Vitals Pulse Rate: 69 BP: 122/81 mmHg Pain:denied   See FIM for current functional status  Therapy/Group: Group Therapy  [name] 01/25/2013, 3:32 PM

## 2013-01-25 NOTE — Progress Notes (Signed)
Patient ID: Albert [name], male   DOB: [birth year], 37 y.o.   MRN: [id number] Subjective/Complaints: Awake, calm. No complaints. No pain. A 12 point review of systems has been performed and if not noted above is otherwise negative.    Objective: Vital Signs: Blood pressure 114/71, pulse 55, temperature 98.2 F (36.8 C), temperature source Oral, resp. rate 16, height 6' (1.829 m), weight 81.5 kg (179 lb 10.8 oz), SpO2 99.00%. No results found. No results found for this basename: WBC, HGB, HCT, PLT,  in the last 72 hours No results found for this basename: NA, K, CL, CO, GLUCOSE, BUN, CREATININE, CALCIUM,  in the last 72 hours CBG (last 3)  No results found for this basename: GLUCAP,  in the last 72 hours  Wt Readings from Last 3 Encounters:  01/03/13 83.3 kg (183 lb 10.3 oz)  01/03/13 83.3 kg (183 lb 10.3 oz)  01/03/13 83.3 kg (183 lb 10.3 oz)    Physical Exam:  Eyes:  Pupils reactive to light  Neck: Neck supple. No thyromegaly present.  Cardiovascular: Normal rate and regular rhythm.  Pulmonary/Chest: Effort normal and breath sounds normal. No respiratory distress.  Abdominal: Soft. Bowel sounds are normal. He exhibits no distension.  Neurological:     RUE tone still is trace to 1. RUE is grossly 4/5. RLE is 4 prox to 2-3/5 APF and 0/tr ADF Senses pain on right. DTR's 3+. Answers simple questions. Limited insight and awareness. Makes good eye contact but attention still limited. Appears have visual field deficits, but inconsistent on exam. I'm in [place]. Can't tell me where. Recalls wife's name Skin:  Craniotomy site and donor sites intact--staples all are out.   Assessment/Plan: 1. 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 Physiatrist is providing close team supervision and 24 hour management of active medical problems listed  below. Physiatrist and rehab team continue to assess barriers to discharge/monitor patient progress toward functional and medical goals.  Continued visual deficits. Difficult to pinpoint on exam. As cognition improves we can further localize. Will need formal neuro-optho eval after dc FIM: FIM - Bathing Bathing Steps Patient Completed: Chest;Right Arm;Left Arm;Abdomen;Front perineal area;Right upper leg;Left upper leg Bathing: 3: Mod-Patient completes 5-7 47f 10 parts or 50-74%  FIM - Upper Body Dressing/Undressing Upper body dressing/undressing steps patient completed: Thread/unthread left sleeve of pullover shirt/dress;Put head through opening of pull over shirt/dress;Pull shirt over trunk;Thread/unthread right sleeve of pullover shirt/dresss Upper body dressing/undressing: 4: Steadying assist FIM - Lower Body Dressing/Undressing Lower body dressing/undressing steps patient completed: Pull pants up/down;Thread/unthread left pants leg Lower body dressing/undressing: 2: Max-Patient completed 25-49% of tasks  FIM - Toileting Toileting steps completed by patient: Performs perineal hygiene;Adjust clothing after toileting Toileting: 3: Mod-Patient completed 2 of 3 steps  FIM - Diplomatic Services operational officer Devices: Grab bars Toilet Transfers: 4-To toilet/BSC: Min A (steadying Pt. > 75%);4-From toilet/BSC: Min A (steadying Pt. > 75%)  FIM - Bed/Chair Transfer Bed/Chair Transfer Assistive Devices: Bed rails;Arm rests Bed/Chair Transfer: 4: Chair or W/C > Bed: Min A (steadying Pt. > 75%);4: Bed > Chair or W/C: Min A (steadying Pt. > 75%)  FIM - Locomotion: Wheelchair Locomotion: Wheelchair: 1: Total Assistance/staff pushes wheelchair (Pt<25%) FIM - Locomotion: Ambulation Locomotion: Ambulation Assistive Devices: Other (comment) (Bilateral HHA) Ambulation/Gait Assistance: 1: +2 Total assist Locomotion: Ambulation: 1: Two helpers  Comprehension Comprehension Mode:  Auditory  Comprehension: 4-Understands basic 75 - 89% of the time/requires cueing 10 - 24% of the time  Expression Expression Mode: Verbal Expression Assistive Devices: 6-Other (Comment) Expression: 4-Expresses basic 75 - 89% of the time/requires cueing 10 - 24% of the time. Needs helper to occlude trach/needs to repeat words.  Social Interaction Social Interaction Mode: Asleep Social Interaction: 4-Interacts appropriately 75 - 89% of the time - Needs redirection for appropriate language or to initiate interaction.  Problem Solving Problem Solving: 3-Solves basic 50 - 74% of the time/requires cueing 25 - 49% of the time  Memory Memory: 2-Recognizes or recalls 25 - 49% of the time/requires cueing 51 - 75% of the time  Medical Problem List and Plan:  1. 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 Status post craniotomy evacuation hematoma with insertion of bone flap abdominal left upper quadrant 12/15/2012 with revision after midline shift 12/28/2012 ---RLAS VI at this point 2. DVT Prophylaxis/Anticoagulation: SCDs. Dopplers negative 3. Numerous facial fractures. Conservative care per ENT  4. Mood: Ativan as needed, Inderal 80 mg every 8 hours.  -re-establish sleep cycle/ sleep chart.  -ritalin- continue for attention -enclosure bed still required 5. Neuropsych: This patient is not capable of making decisions on his own behalf.  6. Seizure disorder. Vimpat--taper to 50mg  bid--dc today continue Keppra, Dilantin.    7. Dysphagia. Gastrostomy PEG tube/ 12/31/2012 per trauma services.  -trach stoma healed  -diet D3  -off TF and H2O flushes except for maintenance 8. Alcohol abuse. Counseling  9. Wound care: local care to PEG, crani, and abdominal sites.  -local care-- -helmet at some point 10. Temp: afebrile, stable leukocytosis -urine culture and cxr negative, dopplers negative. 11. Spasticity:   low dose baclofen has helped  -continued therapy,  engagement of right side 12. Urinary frequency  -timed voids, bladder education    LOS (Days) 22 A FACE TO FACE EVALUATION WAS PERFORMED  SWARTZ,ZACHARY T 01/25/2013 7:51 AM

## 2013-01-25 NOTE — Progress Notes (Addendum)
Occupational Therapy Note  Patient Details  Name: REMER COUSE MRN: [id number] Date of Birth: 07/29/1976 Today's Date: 01/25/2013  Time:  1630-1730  (60 min) Individual session Pain:  None  Addressed functional balance, mobility, attention to right, visual and perceptual deficits.  Sat on side of bed and donned shoes with mod assist and increased time.  Practiced standing balance with mod assist +2 for safety and set up in game of putt putt.  Pt. Leaning halfway over to find golf ball on high contrast surface.  He scans in disorganized manner and finds object faster on his left side.  Pt stood for 30 minutes with 1 rest break,  Needed assistance with picking balls up with scooper.  Tended to overshoot when aiming for the ball.  Pt. Used RUE to hold club and hold ball.     Humberto Seals 01/25/2013, 5:40 PM

## 2013-01-26 ENCOUNTER — Inpatient Hospital Stay (HOSPITAL_COMMUNITY): Payer: BC Managed Care – PPO | Admitting: *Deleted

## 2013-01-26 NOTE — Progress Notes (Signed)
Occupational Therapy Note  Patient Details  Name: Albert [name] MRN: [id number] Date of Birth: September 10, 1976 Today's Date: 01/26/2013  Time:  1100-1215  (75 min) Pain:  None Individual session  Engaged in shower today.  Addressed functional mobility, balance, attending to right, visual/perceptual issues.  Pt. Transferred to shower bench with min assist.  He undressed with moderate assist with organizing/ and motor planning.  He bathed self with minimal assist sit to stand and standing balance.  Transferred back to wc and proceeded to dress at sink, however, pt was resistant to put any thing on but hospital gown.  Got wife and he dressed with her encouragement.    Humberto Seals 01/26/2013, 6:17 PM

## 2013-01-26 NOTE — Progress Notes (Signed)
Patient ID: Albert [name], male   DOB: [birth year], 37 y.o.   MRN: [id number] Subjective/Complaints: Awake. In no distress A 12 point review of systems has been performed and if not noted above is otherwise negative.    Objective: Vital Signs: Blood pressure 110/60, pulse 71, temperature 98.6 F (37 C), temperature source Oral, resp. rate 18, height 6' (1.829 m), weight 81.2 kg (179 lb 0.2 oz), SpO2 100.00%. No results found. No results found for this basename: WBC, HGB, HCT, PLT,  in the last 72 hours No results found for this basename: NA, K, CL, CO, GLUCOSE, BUN, CREATININE, CALCIUM,  in the last 72 hours CBG (last 3)  No results found for this basename: GLUCAP,  in the last 72 hours  Wt Readings from Last 3 Encounters:  01/25/13 81.2 kg (179 lb 0.2 oz)  01/03/13 83.3 kg (183 lb 10.3 oz)  01/03/13 83.3 kg (183 lb 10.3 oz)    Physical Exam:  Eyes:  Pupils reactive to light  Neck: Neck supple. No thyromegaly present.  Cardiovascular: Normal rate and regular rhythm.  Pulmonary/Chest: Effort normal and breath sounds normal. No respiratory distress.  Abdominal: Soft. Bowel sounds are normal. He exhibits no distension.  Neurological:     RUE tone still is trace to 1. RUE is grossly 4/5. RLE is 4 prox to 2-3/5 APF and 0/tr ADF Senses pain on right. DTR's 3+. Answers simple questions. Limited insight and awareness. Confabulates. Remembered my name. Appears have visual field deficits, but inconsistent on exam. I'm in [place]. Can't tell me where. Recalls wife's name Skin:  Craniotomy site and donor sites intact--staples all are out.   Assessment/Plan: 1. Functional deficits secondary to severe left SDH, SAH s/p craniectomy,  which require 3+ hours per day of interdisciplinary therapy in a comprehensive inpatient rehab setting. Physiatrist is providing close team supervision and 24 hour management of active medical problems listed below. Physiatrist and rehab team continue to assess barriers  to discharge/monitor patient progress toward functional and medical goals.   Will need formal neuro-optho eval after dc. RLAS V-VI currently FIM: FIM - Bathing Bathing Steps Patient Completed: Chest;Right Arm;Left Arm;Abdomen;Front perineal area;Right upper leg;Left upper leg (shower) Bathing: 3: Mod-Patient completes 5-7 49f 10 parts or 50-74%  FIM - Upper Body Dressing/Undressing Upper body dressing/undressing steps patient completed: Thread/unthread right sleeve of pullover shirt/dresss;Thread/unthread left sleeve of pullover shirt/dress;Put head through opening of pull over shirt/dress;Pull shirt over trunk Upper body dressing/undressing: 6: More than reasonable amount of time FIM - Lower Body Dressing/Undressing Lower body dressing/undressing steps patient completed: Pull pants up/down Lower body dressing/undressing: 2: Max-Patient completed 25-49% of tasks  FIM - Toileting Toileting steps completed by patient: Performs perineal hygiene;Adjust clothing after toileting Toileting Assistive Devices: Grab bar or rail for support Toileting: 3: Mod-Patient completed 2 of 3 steps  FIM - Diplomatic Services operational officer Devices: Grab bars Toilet Transfers: 4-To toilet/BSC: Min A (steadying Pt. > 75%);4-From toilet/BSC: Min A (steadying Pt. > 75%)  FIM - Bed/Chair Transfer Bed/Chair Transfer Assistive Devices: Bed rails Bed/Chair Transfer: 4: Chair or W/C > Bed: Min A (steadying Pt. > 75%)  FIM - Locomotion: Wheelchair Locomotion: Wheelchair: 1: Total Assistance/staff pushes wheelchair (Pt<25%) FIM - Locomotion: Ambulation Locomotion: Ambulation Assistive Devices: Other (comment) (Bilateral HHA) Ambulation/Gait Assistance: 1: +2 Total assist Locomotion: Ambulation: 1: Two helpers  Comprehension Comprehension Mode: Auditory Comprehension: 4-Understands basic 75 - 89% of the time/requires cueing 10 - 24% of the time  Expression Expression Mode: Verbal Expression  Assistive  Devices: 6-Other (Comment) Expression: 4-Expresses basic 75 - 89% of the time/requires cueing 10 - 24% of the time. Needs helper to occlude trach/needs to repeat words.  Social Interaction Social Interaction Mode: Asleep Social Interaction: 4-Interacts appropriately 75 - 89% of the time - Needs redirection for appropriate language or to initiate interaction.  Problem Solving Problem Solving: 3-Solves basic 50 - 74% of the time/requires cueing 25 - 49% of the time  Memory Memory: 2-Recognizes or recalls 25 - 49% of the time/requires cueing 51 - 75% of the time  Medical Problem List and Plan:  1. 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 Status post craniotomy evacuation hematoma with insertion of bone flap abdominal left upper quadrant 12/15/2012 with revision after midline shift 12/28/2012 ---RLAS VI at this point 2. DVT Prophylaxis/Anticoagulation: SCDs. Dopplers negative 3. Numerous facial fractures. Conservative care per ENT  4. Mood: Ativan as needed, Inderal 80 mg every 8 hours.  -re-establish sleep cycle/ sleep chart.  -ritalin- continue for attention -enclosure bed still required 5. Neuropsych: This patient is not capable of making decisions on his own behalf.  6. Seizure disorder. Vimpat--taper to 50mg  bid--dc today continue Keppra, Dilantin.    7. Dysphagia. Gastrostomy PEG tube/ 12/31/2012 per trauma services.  -trach stoma healed  -diet D3  -off TF and H2O flushes except for maintenance---dc G tube 4-5 weeks post placement 8. Alcohol abuse. Counseling  9. Wound care: local care to PEG, crani, and abdominal sites.  -local care-- -helmet at some point 10. Temp: afebrile, stable leukocytosis -urine culture and cxr negative, dopplers negative. 11. Spasticity:   low dose baclofen has helped  -continued therapy, engagement of right side 12. Urinary frequency  -timed voids, bladder education    LOS (Days) 23 A FACE TO FACE  EVALUATION WAS PERFORMED  Ramandeep Arington T 01/26/2013 7:44 AM

## 2013-01-27 ENCOUNTER — Inpatient Hospital Stay (HOSPITAL_COMMUNITY): Payer: BC Managed Care – PPO | Admitting: Occupational Therapy

## 2013-01-27 ENCOUNTER — Inpatient Hospital Stay (HOSPITAL_COMMUNITY): Payer: BC Managed Care – PPO | Admitting: Speech Pathology

## 2013-01-27 ENCOUNTER — Encounter (HOSPITAL_COMMUNITY): Payer: BC Managed Care – PPO | Admitting: Occupational Therapy

## 2013-01-27 ENCOUNTER — Inpatient Hospital Stay (HOSPITAL_COMMUNITY): Payer: BC Managed Care – PPO

## 2013-01-27 DIAGNOSIS — S069X9A Unspecified intracranial injury with loss of consciousness of unspecified duration, initial encounter: Secondary | ICD-10-CM

## 2013-01-27 MED ORDER — PRO-STAT SUGAR FREE PO LIQD
30.0000 mL | Freq: Three times a day (TID) | ORAL | Status: DC
  Administered 2013-01-27 – 2013-02-07 (×31): 30 mL via ORAL
  Filled 2013-01-27 (×35): qty 30

## 2013-01-27 MED ORDER — PANTOPRAZOLE SODIUM 40 MG PO PACK
40.0000 mg | PACK | Freq: Every day | ORAL | Status: DC
  Filled 2013-01-27: qty 20

## 2013-01-27 MED ORDER — PROPRANOLOL HCL 20 MG/5ML PO SOLN
60.0000 mg | Freq: Three times a day (TID) | ORAL | Status: DC
  Filled 2013-01-27 (×3): qty 15

## 2013-01-27 MED ORDER — LEVETIRACETAM 100 MG/ML PO SOLN
1500.0000 mg | Freq: Two times a day (BID) | ORAL | Status: DC
  Administered 2013-01-27 – 2013-02-05 (×19): 1500 mg via ORAL
  Filled 2013-01-27 (×22): qty 15

## 2013-01-27 MED ORDER — PROPRANOLOL HCL 60 MG PO TABS
60.0000 mg | ORAL_TABLET | Freq: Three times a day (TID) | ORAL | Status: DC
  Administered 2013-01-27 – 2013-01-28 (×5): 60 mg via ORAL
  Filled 2013-01-27 (×10): qty 1

## 2013-01-27 MED ORDER — BACLOFEN 10 MG PO TABS
10.0000 mg | ORAL_TABLET | Freq: Two times a day (BID) | ORAL | Status: DC
  Administered 2013-01-27 – 2013-02-07 (×22): 10 mg via ORAL
  Filled 2013-01-27 (×24): qty 1

## 2013-01-27 MED ORDER — PHENYTOIN 50 MG PO CHEW
100.0000 mg | CHEWABLE_TABLET | Freq: Three times a day (TID) | ORAL | Status: DC
  Administered 2013-01-27 – 2013-02-05 (×29): 100 mg via ORAL
  Filled 2013-01-27 (×33): qty 2

## 2013-01-27 MED ORDER — PROPRANOLOL HCL 20 MG/5ML PO SOLN
60.0000 mg | Freq: Three times a day (TID) | ORAL | Status: DC
  Filled 2013-01-27 (×4): qty 15

## 2013-01-27 MED ORDER — METHYLPHENIDATE HCL 5 MG PO TABS
15.0000 mg | ORAL_TABLET | Freq: Two times a day (BID) | ORAL | Status: DC
  Administered 2013-01-28 – 2013-02-07 (×22): 15 mg via ORAL
  Filled 2013-01-27 (×24): qty 3

## 2013-01-27 MED ORDER — PANTOPRAZOLE SODIUM 40 MG PO TBEC
40.0000 mg | DELAYED_RELEASE_TABLET | Freq: Every day | ORAL | Status: DC
  Administered 2013-01-28 – 2013-02-07 (×11): 40 mg via ORAL
  Filled 2013-01-27 (×11): qty 1

## 2013-01-27 MED ORDER — ACETAMINOPHEN 160 MG/5ML PO SOLN
650.0000 mg | ORAL | Status: DC | PRN
  Filled 2013-01-27 (×3): qty 20.3

## 2013-01-27 NOTE — Progress Notes (Signed)
Speech Language Pathology Weekly Progress Note  Patient Details  Name: OLUWATOSIN HIGGINSON MRN: [id number] Date of Birth: September 10, 1976  Today's Date: 01/27/2013  Short Term Goals: Week 3: SLP Short Term Goal 1 (Week 3): Patient will demonstrate sustained attention to task for 15 minutes with Mod assist verbal and tactile cues.   SLP Short Term Goal 1 - Progress (Week 3): Met SLP Short Term Goal 2 (Week 3): Patient will follow one-step commands with Mod assist verbal and tactile cues.  SLP Short Term Goal 2 - Progress (Week 3): Met SLP Short Term Goal 3 (Week 3): Patient will verbalize orientation and awarenss of deficits with Max assist clinician cues. SLP Short Term Goal 3 - Progress (Week 3): Met SLP Short Term Goal 4 (Week 3): Patient will consume Dys.2 textures and thin liquids with no overt s/s of aspiration with Min-Mod clinician assist.  SLP Short Term Goal 4 - Progress (Week 3): Met SLP Short Term Goal 5 (Week 3): Patient will participate in diagnostic assessment of reading comprehension abilities. SLP Short Term Goal 5 - Progress (Week 3): Met SLP Short Term Goal 6 (Week 3): Patient will solve basic, familiar problems with Max assist clinician cues SLP Short Term Goal 6 - Progress (Week 3): Met Week 4: SLP Short Term Goal 1 (Week 4): Patient will demonstrate selective attention to task for 15 minutes with Min assist verbal and tactile cues.   SLP Short Term Goal 2 (Week 4): Patient will consume Regular textures and thin liquids via straw with no overt s/s of aspiration and Supervision verbal cues. SLP Short Term Goal 3 (Week 4): Patient will solve basic, familiar problems with Min assist multi-modal clinician cues. SLP Short Term Goal 4 (Week 4): Patient will request help as needed during daily tasks with Min assist question cues. SLP Short Term Goal 5 (Week 4): Patient will self-monitor and correct verbal errors with Max assist clinician cues. SLP Short Term Goal 6 (Week 4): Patient will  utilize working memory strategies with Max assist multi-modal cues.  Weekly Progress Updates: Patient met 5 out of 5 short term goals this reporting period due to gains in sustained attention, ability to follow directions, problem solving, and awareness.  Patient also progressed from Dys.2 textures to Dys.3 textures and was upgraded to Regular textures today.  Patient is currently a [place] Level VI.  Given this patient continues to require skilled SLP services to assist with addressing goals for cognitive recovery, lengthening his attention, increasing his orientation, awareness, recall and ability to solve basic/familiar problems  as well as advancing his diet.  By addressing these deficits it will maximize functional independence and reduce burden of care prior to discharge.      SLP Intensity: Minumum of 1-2 x/day, 30 to 90 minutes SLP Frequency: 5 out of 7 days SLP Duration/Estimated Length of Stay: 02/07/13 SLP Treatment/Interventions: Cognitive remediation/compensation;Internal/external aids;Environmental controls;Therapeutic Activities;Patient/family education;Dysphagia/aspiration precaution training;Cueing hierarchy;Functional tasks;Speech/Language facilitation  Charlane Ferretti., CCC-SLP [phone]  Ellason Segar 01/27/2013, 1:34 PM

## 2013-01-27 NOTE — Progress Notes (Signed)
Occupational Therapy Session Note  Patient Details  Name: Albert [name] MRN: [id number] Date of Birth: 06/07/1976  Today's Date: 01/27/2013 Time: 1330-1400 Time Calculation (min): 30 min  Skilled Therapeutic Interventions/Progress Updates:    1:1 [place] with physical therapy with focus on functional ambulation with only one caregiver with verbal cues for attention to right visual field to avoid obstacles. Performed toileting in bathroom with pt persevering on something about his pants or underwear (changed brief and situated pants and pt was okay with the end result). Pt benefits from reassurance things are okay. Simple kitchen task making premixed cookies (break apart ones) focusing on isolated movement and control of right UE as dominant with promotion of normal patterns of movement, working memory of recall of step to get dough and put it on cookie tray while maintaining standing balance, visual scanning from left to right visual field. Pt with increased language of confusion and expressive aphasia with little awareness of mistakes. Pt left with mother in family room with QRB on.  Therapy Documentation Precautions:  Precautions Precautions: Fall Precaution Comments: L crani, skull cap removed  and in abdomen Required Braces or Orthoses: Other Brace/Splint Other Brace/Splint: abdominal binder Restrictions Weight Bearing Restrictions: No Pain: C/o HA- reported to RN and she brought meds See FIM for current functional status  Therapy/Group: Individual Therapy  [name] [facility] 01/27/2013, 2:37 PM

## 2013-01-27 NOTE — Progress Notes (Signed)
Occupational Therapy Session Note  Patient Details  Name: LATRELL POTEMPA MRN: [id number] Date of Birth: Sep 09, 1976  Today's Date: 01/27/2013 Time: 0730-0830 Time Calculation (min): 60 min   Skilled Therapeutic Interventions/Progress Updates:    1:1 Pt awake in bed. Discussed orientation information and intellectual awareness about body. Transferred bed <w/c<>toilet with steadying A. Pt perform toileting with steady A 3/3 steps. Pt declined shower this morning and requested to just get dressed- granted request and focused on visual/ spatial awareness, bilateral UE coordination, simple problem solving, recognizing when need A, standing baLANCE. Pt participated in shaving and tooth brushing focus on eye/ hand coordination and self correction hand position to correctly use.   Therapy Documentation Precautions:  Precautions Precautions: Fall Precaution Comments: L crani, skull cap removed  and in abdomen Required Braces or Orthoses: Other Brace/Splint Other Brace/Splint: abdominal binder Restrictions Weight Bearing Restrictions: No Pain:  no c/o pain  See FIM for current functional status  Therapy/Group: Individual Therapy  [name] [facility] 01/27/2013, 9:29 AM

## 2013-01-27 NOTE — Progress Notes (Signed)
Speech Language Pathology Daily Session Note  Patient Details  Name: Albert [name] MRN: [id number] Date of Birth: [birth year]  Today's Date: 01/27/2013 Time: 1130-1155 Time Calculation (min): 25 min  Short Term Goals: Week 3: SLP Short Term Goal 1 (Week 3): Patient will demonstrate sustained attention to task for 15 minutes with Mod assist verbal and tactile cues.   SLP Short Term Goal 2 (Week 3): Patient will follow one-step commands with Mod assist verbal and tactile cues.  SLP Short Term Goal 3 (Week 3): Patient will verbalize orientation and awarenss of deficits with Max assist clinician cues. SLP Short Term Goal 4 (Week 3): Patient will consume Dys.2 textures and thin liquids with no overt s/s of aspiration with Min-Mod clinician assist.  SLP Short Term Goal 5 (Week 3): Patient will participate in diagnostic assessment of reading comprehension abilities. SLP Short Term Goal 6 (Week 3): Patient will solve basic, familiar problems with Max assist clinician cues  Skilled Therapeutic Interventions: Skilled treatment session focused on address safely self -feeding Dys.3 textures (kitchen did not get upgrade in time) and thin liquids via straw with no overt s/s of aspiraiton.  Patient required Min-Mod assist cues to self -feed due to visual-perceptual deficits.     FIM:  Comprehension Comprehension Mode: Auditory Comprehension: 4-Understands basic 75 - 89% of the time/requires cueing 10 - 24% of the time Expression Expression Mode: Verbal Expression: 3-Expresses basic 50 - 74% of the time/requires cueing 25 - 50% of the time. Needs to repeat parts of sentences. Social Interaction Social Interaction: 4-Interacts appropriately 75 - 89% of the time - Needs redirection for appropriate language or to initiate interaction. Problem Solving Problem Solving: 3-Solves basic 50 - 74% of the time/requires cueing 25 - 49% of the time Memory Memory: 2-Recognizes or recalls 25 - 49% of the  time/requires cueing 51 - 75% of the time FIM - Eating Eating Activity: 5: Needs verbal cues/supervision  Pain Pain Assessment Pain Assessment: No/denies pain  Therapy/Group: Individual Therapy  Charlane Ferretti., CCC-SLP [phone]  Albert [name] 01/27/2013, 12:12 PM

## 2013-01-27 NOTE — Progress Notes (Signed)
Physical Therapy Note  Patient Details  Name: Albert [name] MRN: [id number] Date of Birth: 09/16/1975 Today's Date: 01/27/2013  0865-7846 (60 minutes) individual Pain: no reported pain Focus of treatment: Therapeutic activities focused on standing balance/tolerance; visual scanning to right; attention to task; gait training Treatment: Standing X 15 minutes min/mod assist with lean to right performing activity (sorting playing cards as to suite with accuracy of approximately 10%) . Pt reports difficulty distinguishing colors and shapes; standing ball toss x 5 minutes with difficulty catching towards right; gait 150 feet HHA on right (mod assist) with improving attention to gait sequencing. Speech of confabulation continues.   1300-1330 (30 minutes) [place] with OT  Pain: no reported pain Focus of treatment: Therapeutic activities focused on performing familiar activity including gait /standing tolerance Treatment: Gait on unit mod assist +1 with tactile cues at shoulder to prevent loss of balance forward with improving gait sequencing on right; pt ambulated to kitchen as above to prepare preprepared cookies using right hand  to open package ( pt unable to maintain grip on right) and  place dough on cookie sheet (difficulty placing in rows); pt unable to recall current task with vcs  And max vcs for sequencing task.     [name],JIM 01/27/2013, 10:00 AM

## 2013-01-27 NOTE — Plan of Care (Signed)
Problem: RH PAIN MANAGEMENT Goal: RH STG PAIN MANAGED AT OR BELOW PT'S PAIN GOAL Pain managed with prn medications :<3. Observe for non-verbal S/S pain.  Outcome: Progressing Denies Pain or Discomfort

## 2013-01-27 NOTE — Progress Notes (Signed)
Speech Language Pathology Daily Session Note  Patient Details  Name: Albert [name] MRN: [id number] Date of Birth: May 07, 1976  Today's Date: 01/27/2013 Time: 5621-3086 Time Calculation (min): 45 min  Short Term Goals: Week 3: SLP Short Term Goal 1 (Week 3): Patient will demonstrate sustained attention to task for 15 minutes with Mod assist verbal and tactile cues.   SLP Short Term Goal 2 (Week 3): Patient will follow one-step commands with Mod assist verbal and tactile cues.  SLP Short Term Goal 3 (Week 3): Patient will verbalize orientation and awarenss of deficits with Max assist clinician cues. SLP Short Term Goal 4 (Week 3): Patient will consume Dys.2 textures and thin liquids with no overt s/s of aspiration with Min-Mod clinician assist.  SLP Short Term Goal 5 (Week 3): Patient will participate in diagnostic assessment of reading comprehension abilities. SLP Short Term Goal 6 (Week 3): Patient will solve basic, familiar problems with Max assist clinician cues  Skilled Therapeutic Interventions: Skilled treatment session focused on addressing dysphagia and cognitive goals.  SLP facilitated session with upgraded trails of Regular textures and thin liquids with set up assist and Supervision level verbal cues to problem solve self-feeding.  Patient exhibited no overt s/s of aspiration; as a result it is recommended that his diet be upgraded to regular textures and thin liquids with continued full supervision.  SLP also facilitated session with mod assist multi-modal cues to sort and count coins and write biographical information due to visual perceptual deficits.      FIM:  Comprehension Comprehension Mode: Auditory Comprehension: 4-Understands basic 75 - 89% of the time/requires cueing 10 - 24% of the time Expression Expression Mode: Verbal Expression: 3-Expresses basic 50 - 74% of the time/requires cueing 25 - 50% of the time. Needs to repeat parts of sentences. Social Interaction Social  Interaction: 4-Interacts appropriately 75 - 89% of the time - Needs redirection for appropriate language or to initiate interaction. Problem Solving Problem Solving: 3-Solves basic 50 - 74% of the time/requires cueing 25 - 49% of the time Memory Memory: 2-Recognizes or recalls 25 - 49% of the time/requires cueing 51 - 75% of the time FIM - Eating Eating Activity: 5: Needs verbal cues/supervision  Pain Pain Assessment Pain Assessment: No/denies pain  Therapy/Group: Individual Therapy  Charlane Ferretti., CCC-SLP [phone]  [name] 01/27/2013, 11:25 AM

## 2013-01-27 NOTE — Progress Notes (Signed)
Patient ID: Albert [name], male   DOB: Jun 15, 1976, 37 y.o.   MRN: [id number] Subjective/Complaints: Slept well. No problems yesterday A 12 point review of systems has been performed and if not noted above is otherwise negative.    Objective: Vital Signs: Blood pressure 107/65, pulse 66, temperature 98.3 F (36.8 C), temperature source Oral, resp. rate 18, height 6' (1.829 m), weight 80.7 kg (177 lb 14.6 oz), SpO2 100.00%. No results found. No results found for this basename: WBC, HGB, HCT, PLT,  in the last 72 hours No results found for this basename: NA, K, CL, CO, GLUCOSE, BUN, CREATININE, CALCIUM,  in the last 72 hours CBG (last 3)  No results found for this basename: GLUCAP,  in the last 72 hours  Wt Readings from Last 3 Encounters:  01/27/13 80.7 kg (177 lb 14.6 oz)  01/03/13 83.3 kg (183 lb 10.3 oz)  01/03/13 83.3 kg (183 lb 10.3 oz)    Physical Exam:  Eyes:  Pupils reactive to light  Neck: Neck supple. No thyromegaly present.  Cardiovascular: Normal rate and regular rhythm.  Pulmonary/Chest: Effort normal and breath sounds normal. No respiratory distress.  Abdominal: Soft. Bowel sounds are normal. He exhibits no distension.  Neurological:     RUE tone still is trace to 1. RUE is grossly 4/5. RLE is 4 prox to 2-3/5 APF and 0/tr ADF Senses pain on right. DTR's 3+. Answers simple questions. Limited insight and awareness. Confabulates. Remembered my name. Appears have visual field deficits, but inconsistent on exam. I'm in [place]. Can't tell me where. Recalls wife's name Skin:  Craniotomy site and donor sites intact--staples all are out.   Assessment/Plan: 1. Functional deficits secondary to severe left SDH, SAH s/p craniectomy,  which require 3+ hours per day of interdisciplinary therapy in a comprehensive inpatient rehab setting. Physiatrist is providing close team supervision and 24 hour management of active medical problems listed below. Physiatrist and rehab team continue to  assess barriers to discharge/monitor patient progress toward functional and medical goals.   Will need formal neuro-optho eval after dc. RLAS V-VI currently. Consider Right AFO FIM: FIM - Bathing Bathing Steps Patient Completed: Chest;Right Arm;Left Arm;Abdomen;Front perineal area;Right upper leg;Left upper leg;Buttocks Bathing: 3: Mod-Patient completes 5-7 22f 10 parts or 50-74%  FIM - Upper Body Dressing/Undressing Upper body dressing/undressing steps patient completed: Thread/unthread right sleeve of pullover shirt/dresss;Thread/unthread left sleeve of pullover shirt/dress;Put head through opening of pull over shirt/dress;Pull shirt over trunk Upper body dressing/undressing: 4: Min-Patient completed 75 plus % of tasks FIM - Lower Body Dressing/Undressing Lower body dressing/undressing steps patient completed: Pull pants up/down Lower body dressing/undressing: 2: Max-Patient completed 25-49% of tasks  FIM - Toileting Toileting steps completed by patient: Performs perineal hygiene;Adjust clothing after toileting Toileting Assistive Devices: Grab bar or rail for support Toileting: 3: Mod-Patient completed 2 of 3 steps  FIM - Diplomatic Services operational officer Devices: Grab bars Toilet Transfers: 4-To toilet/BSC: Min A (steadying Pt. > 75%);4-From toilet/BSC: Min A (steadying Pt. > 75%)  FIM - Bed/Chair Transfer Bed/Chair Transfer Assistive Devices: Bed rails Bed/Chair Transfer: 4: Chair or W/C > Bed: Min A (steadying Pt. > 75%)  FIM - Locomotion: Wheelchair Locomotion: Wheelchair: 1: Total Assistance/staff pushes wheelchair (Pt<25%) FIM - Locomotion: Ambulation Locomotion: Ambulation Assistive Devices: Other (comment) (Bilateral HHA) Ambulation/Gait Assistance: 1: +2 Total assist Locomotion: Ambulation: 1: Two helpers  Comprehension Comprehension Mode: Auditory Comprehension: 4-Understands basic 75 - 89% of the time/requires cueing 10 - 24% of the  time  Expression Expression Mode: Verbal Expression Assistive Devices: 6-Other (Comment) Expression: 4-Expresses basic 75 - 89% of the time/requires cueing 10 - 24% of the time. Needs helper to occlude trach/needs to repeat words.  Social Interaction Social Interaction Mode: Asleep Social Interaction: 4-Interacts appropriately 75 - 89% of the time - Needs redirection for appropriate language or to initiate interaction.  Problem Solving Problem Solving: 3-Solves basic 50 - 74% of the time/requires cueing 25 - 49% of the time  Memory Memory: 2-Recognizes or recalls 25 - 49% of the time/requires cueing 51 - 75% of the time  Medical Problem List and Plan:  1. 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 Status post craniotomy evacuation hematoma with insertion of bone flap abdominal left upper quadrant 12/15/2012 with revision after midline shift 12/28/2012 ---RLAS V/VI at this point 2. DVT Prophylaxis/Anticoagulation: SCDs. Dopplers negative 3. Numerous facial fractures. Conservative care per ENT  4. Mood: Ativan as needed, Inderal 80 mg every 8 hours.--begin to wean -sleep improved. -ritalin- continue for attention -enclosure bed still required 5. Neuropsych: This patient is not capable of making decisions on his own behalf.  6. Seizure disorder. Vimpat--taper to 50mg  bid--dc today continue Keppra, Dilantin.    7. Dysphagia. Gastrostomy PEG tube/ 12/31/2012 per trauma services.  -trach stoma healed  -diet D3  -off TF and H2O flushes except for maintenance---dc G tube 4-5 weeks post placement 8. Alcohol abuse. Counseling  9. Wound care: local care to PEG, crani, and abdominal sites.  -local care-- -helmet at some point 10. Temp: afebrile, stable leukocytosis -urine culture and cxr negative, dopplers negative. 11. Spasticity:   low dose baclofen has helped  -continued therapy, engagement of right side 12. Urinary frequency  -timed voids,  bladder education    LOS (Days) 24 A FACE TO FACE EVALUATION WAS PERFORMED  Lota Leamer T 01/27/2013 7:42 AM

## 2013-01-28 ENCOUNTER — Inpatient Hospital Stay (HOSPITAL_COMMUNITY): Payer: BC Managed Care – PPO | Admitting: Physical Therapy

## 2013-01-28 ENCOUNTER — Inpatient Hospital Stay (HOSPITAL_COMMUNITY): Payer: BC Managed Care – PPO | Admitting: Speech Pathology

## 2013-01-28 ENCOUNTER — Inpatient Hospital Stay (HOSPITAL_COMMUNITY): Payer: BC Managed Care – PPO | Admitting: Occupational Therapy

## 2013-01-28 MED ORDER — ADULT MULTIVITAMIN W/MINERALS CH
1.0000 | ORAL_TABLET | Freq: Every day | ORAL | Status: DC
  Administered 2013-01-29 – 2013-02-07 (×10): 1 via ORAL
  Filled 2013-01-28 (×12): qty 1

## 2013-01-28 NOTE — Progress Notes (Signed)
Speech Language Pathology Daily Session Note  Patient Details  Name: Albert [name] MRN: [id number] Date of Birth: July 07, 1976  Today's Date: 01/28/2013 Time: 1478-2956 Time Calculation (min): 60 min  Short Term Goals: Week 4: SLP Short Term Goal 1 (Week 4): Patient will demonstrate selective attention to task for 15 minutes with Min assist verbal and tactile cues.   SLP Short Term Goal 2 (Week 4): Patient will consume Regular textures and thin liquids via straw with no overt s/s of aspiration and Supervision verbal cues. SLP Short Term Goal 3 (Week 4): Patient will solve basic, familiar problems with Min assist multi-modal clinician cues. SLP Short Term Goal 4 (Week 4): Patient will request help as needed during daily tasks with Min assist question cues. SLP Short Term Goal 5 (Week 4): Patient will self-monitor and correct verbal errors with Max assist clinician cues. SLP Short Term Goal 6 (Week 4): Patient will utilize working memory strategies with Max assist multi-modal cues.  Skilled Therapeutic Interventions: Skilled treatment session focused on addressing cognitive recovery and dysphagia goals.  Upon arrival patient and wife engaged in discussion regarding orientation and awareness of current hospital course.  SLP facilitated session with Min-Mod assist question cues and repeats to clarify verbal expression.  Patient required Max assist cues for overall awareness of verbal errors during discussion.  SLP also facilitated session with lunch of Regular textures and thin liquids with Min assist cues to problem solve self-feeding with no overt s/s of aspiration.     FIM:  Comprehension Comprehension Mode: Auditory Comprehension: 4-Understands basic 75 - 89% of the time/requires cueing 10 - 24% of the time Expression Expression Mode: Verbal Expression: 3-Expresses basic 50 - 74% of the time/requires cueing 25 - 50% of the time. Needs to repeat parts of sentences. Social Interaction Social  Interaction Mode: Asleep Social Interaction: 5-Interacts appropriately 90% of the time - Needs monitoring or encouragement for participation or interaction. Problem Solving Problem Solving: 4-Solves basic 75 - 89% of the time/requires cueing 10 - 24% of the time Memory Memory: 2-Recognizes or recalls 25 - 49% of the time/requires cueing 51 - 75% of the time FIM - Eating Eating Activity: 5: Needs verbal cues/supervision  Pain Pain Assessment Pain Assessment: No/denies pain  Therapy/Group: Individual Therapy  Charlane Ferretti., CCC-SLP [phone]  [name] 01/28/2013, 1:07 PM

## 2013-01-28 NOTE — Progress Notes (Signed)
Occupational Therapy Weekly Progress Note  Patient Details  Name: Albert [name] MRN: [id number] Date of Birth: Jan 25, 1976  Today's Date: 01/28/2013   Patient has met 4 of 4 short term goals.  Pt continues to make good gains in OT, achieving all of his STG for this week. Pt is overall min to mod A for bathing at shower level and dressing, basic transfers and min to mod A for functional ambulation (short distances). Pt with increased intellectual awareness especially when moving with an ADL tasks (awareness "body is not quite right." Pt's behaviors are consistent with Rancho Level VI with some behaviors in VII (when not fatigued). Pt continues to demonstrate language of confusion and making up words. Pt con follow all basic commands with encouragement and extra time related to ADL tasks. Pt continues to demonstrate deficits in visual scanning and visual acuity; describing "black spots" throughout visual field at times.  Pt can demonstrate functional ambulation with one caregiver as of yesterday!  Patient continues to demonstrate the following deficits:muscle weakness, impaired timing and sequencing, abnormal tone, unbalanced muscle activation, decreased coordination and decreased motor planning, decreased visual perceptual skills and field cut, decreased attention to right and decreased motor planning, decreased attention, decreased awareness, decreased problem solving, decreased safety awareness, decreased memory and delayed processing and decreased standing balance, hemiplegia, decreased balance strategies and difficulty maintaining precautions and therefore will continue to benefit from skilled OT intervention to enhance overall performance with BADL and Reduce care partner burden.  Patient progressing toward long term goals..  Continue plan of care.  OT Short Term Goals Week 2:  OT Short Term Goal 1 (Week 2): pt will perform 3/4 grooming tasks with max verbal cues for initiation OT Short Term Goal 1  - Progress (Week 2): Met OT Short Term Goal 2 (Week 2): pt will perform UB dressing with min A  OT Short Term Goal 2 - Progress (Week 2): Met OT Short Term Goal 3 (Week 2): pt will thread BLE into pants legs with min A  OT Short Term Goal 3 - Progress (Week 2): Met OT Short Term Goal 4 (Week 2): pt will perform UB bathing with min A OT Short Term Goal 4 - Progress (Week 2): Met Week 3:  OT Short Term Goal 1 (Week 3): Pt will indicate toileting needs with staff/ family member 50% of time (when with staff/family) OT Short Term Goal 2 (Week 3): Perform 3/3 toileting tasks with supervision OT Short Term Goal 3 (Week 3): Perform basic transfers with supervision OT Short Term Goal 4 (Week 3): Complete LB dressing with min A OT Short Term Goal 5 (Week 3): Pt will self feed with supervision with mod cuing for visual scanning  Skilled Therapeutic Interventions/Progress Updates:    continue with POC  Therapy Documentation Precautions:  Precautions Precautions: Fall Precaution Comments: L crani, skull cap removed  and in abdomen Required Braces or Orthoses: Other Brace/Splint Other Brace/Splint: abdominal binder Restrictions Weight Bearing Restrictions: No General:   Vital Signs: Therapy Vitals Temp: 97.9 F (36.6 C) Temp src: Oral Pulse Rate: 52 Resp: 18 BP: 102/50 mmHg Patient Position, if appropriate: Lying Oxygen Therapy SpO2: 100 % O2 Device: None (Room air)  See FIM for current functional status  Therapy/Group: Individual Therapy  [name] [facility] 01/28/2013, 7:19 AM

## 2013-01-28 NOTE — Progress Notes (Signed)
Physical Therapy Weekly Progress Note  Patient Details  Name: Albert [name] MRN: [id number] Date of Birth: [birth year]  Today's Date: 01/28/2013  Patient has met 3 of 3 short term goals. Pt has improved balance, coordination and mobility and is now min A with transfers, min-mod A with gait with +1 assist, improving awareness but with decreased short term memory limiting carry over of new information.  Patient continues to demonstrate the following deficits: impaired balance, decreased coordination, impaired gait, decreased attention and awareness and therefore will continue to benefit from skilled PT intervention to enhance overall performance with activity tolerance, balance, postural control, ability to compensate for deficits, attention, awareness, coordination and knowledge of precautions.  Patient progressing toward long term goals..  Long term goals upgraded to supervision level for transfers.  Continue plan of care.  PT Short Term Goals Week 4:  PT Short Term Goal 1 (Week 4): Pt will demo static standing balance for funcitonal activity x 5 mins with min A PT Short Term Goal 1 - Progress (Week 4): Met PT Short Term Goal 2 (Week 4): Pt will gait with mod A in controlled environment 50' PT Short Term Goal 2 - Progress (Week 4): Met PT Short Term Goal 3 (Week 4): Pt will demo sustained attention to task x 5 minutes with min cuing in min distracting environment PT Short Term Goal 3 - Progress (Week 4): Met  Skilled Therapeutic Interventions/Progress Updates:  Ambulation/gait training;Balance/vestibular training;Cognitive remediation/compensation;Community reintegration;Patient/family education;Stair training;UE/LE Coordination activities;UE/LE Strength taining/ROM;Splinting/orthotics;Pain management;DME/adaptive equipment instruction;Neuromuscular re-education;Therapeutic Exercise;Wheelchair propulsion/positioning;Therapeutic Activities;Functional mobility training;Discharge planning      See FIM for current functional status   Edy Belt 01/28/2013, 8:47 AM

## 2013-01-28 NOTE — Progress Notes (Signed)
Patient ID: Albert [name], male   DOB: 09-23-1975, 37 y.o.   MRN: [id number] Better, talking. Moves all 4 extremities. Wound healing, po site mild sunk in.

## 2013-01-28 NOTE — Progress Notes (Signed)
Patient ID: Albert [name], male   DOB: June 24, 1976, 37 y.o.   MRN: [id number] Subjective/Complaints: Tech cued him regarding emptying bladder and pt voided in BR, continent this am A 12 point review of systems has been performed and if not noted above is otherwise negative.     Objective: Vital Signs: Blood pressure 102/50, pulse 52, temperature 97.9 F (36.6 C), temperature source Oral, resp. rate 18, height 6' (1.829 m), weight 80.7 kg (177 lb 14.6 oz), SpO2 100.00%. No results found. No results found for this basename: WBC, HGB, HCT, PLT,  in the last 72 hours No results found for this basename: NA, K, CL, CO, GLUCOSE, BUN, CREATININE, CALCIUM,  in the last 72 hours CBG (last 3)  No results found for this basename: GLUCAP,  in the last 72 hours  Wt Readings from Last 3 Encounters:  01/27/13 80.7 kg (177 lb 14.6 oz)  01/03/13 83.3 kg (183 lb 10.3 oz)  01/03/13 83.3 kg (183 lb 10.3 oz)    Physical Exam:  Eyes:  Pupils reactive to light  Neck: Neck supple. No thyromegaly present.  Cardiovascular: Normal rate and regular rhythm.  Pulmonary/Chest: Effort normal and breath sounds normal. No respiratory distress.  Abdominal: Soft. Bowel sounds are normal. He exhibits no distension.  Neurological:     RUE tone still is trace to 1. RUE is grossly 4/5. RLE is 4 prox to 2-3/5 APF and 0/tr ADF Senses pain on right. DTR's 3+. Answers simple questions. Limited insight and awareness. Confabulates. Remembered my name. Appears have visual field deficits, but inconsistent on exam. I'm in [place]. Can't tell me where. Recalls wife's name Skin:  Craniotomy site and donor sites intact--staples all are out.   Assessment/Plan: 1. Functional deficits secondary to severe left SDH, SAH s/p craniectomy,  which require 3+ hours per day of interdisciplinary therapy in a comprehensive inpatient rehab setting. Physiatrist is providing close team supervision and 24 hour management of active medical problems listed  below. Physiatrist and rehab team continue to assess barriers to discharge/monitor patient progress toward functional and medical goals.   Will need formal neuro-optho eval after dc. RLAS V-VI currently. Consider Right AFO FIM: FIM - Bathing Bathing Steps Patient Completed: Chest;Right Arm;Left Arm;Abdomen;Front perineal area;Right upper leg;Left upper leg;Buttocks Bathing: 3: Mod-Patient completes 5-7 60f 10 parts or 50-74%  FIM - Upper Body Dressing/Undressing Upper body dressing/undressing steps patient completed: Thread/unthread right sleeve of pullover shirt/dresss;Thread/unthread left sleeve of pullover shirt/dress;Put head through opening of pull over shirt/dress;Pull shirt over trunk Upper body dressing/undressing: 4: Min-Patient completed 75 plus % of tasks FIM - Lower Body Dressing/Undressing Lower body dressing/undressing steps patient completed: Pull pants up/down Lower body dressing/undressing: 2: Max-Patient completed 25-49% of tasks  FIM - Toileting Toileting steps completed by patient: Performs perineal hygiene;Adjust clothing after toileting Toileting Assistive Devices: Grab bar or rail for support Toileting: 3: Mod-Patient completed 2 of 3 steps  FIM - Diplomatic Services operational officer Devices: Grab bars Toilet Transfers: 4-To toilet/BSC: Min A (steadying Pt. > 75%);4-From toilet/BSC: Min A (steadying Pt. > 75%)  FIM - Bed/Chair Transfer Bed/Chair Transfer Assistive Devices: Bed rails Bed/Chair Transfer: 4: Chair or W/C > Bed: Min A (steadying Pt. > 75%)  FIM - Locomotion: Wheelchair Locomotion: Wheelchair: 1: Total Assistance/staff pushes wheelchair (Pt<25%) FIM - Locomotion: Ambulation Locomotion: Ambulation Assistive Devices: Other (comment) (Bilateral HHA) Ambulation/Gait Assistance: 1: +2 Total assist Locomotion: Ambulation: 1: Two helpers  Comprehension Comprehension Mode: Auditory Comprehension: 4-Understands basic 75 - 89% of the  time/requires  cueing 10 - 24% of the time  Expression Expression Mode: Verbal Expression Assistive Devices: 6-Other (Comment) Expression: 3-Expresses basic 50 - 74% of the time/requires cueing 25 - 50% of the time. Needs to repeat parts of sentences.  Social Interaction Social Interaction Mode: Asleep Social Interaction: 4-Interacts appropriately 75 - 89% of the time - Needs redirection for appropriate language or to initiate interaction.  Problem Solving Problem Solving: 3-Solves basic 50 - 74% of the time/requires cueing 25 - 49% of the time  Memory Memory: 2-Recognizes or recalls 25 - 49% of the time/requires cueing 51 - 75% of the time  Medical Problem List and Plan:  1. 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 Status post craniotomy evacuation hematoma with insertion of bone flap abdominal left upper quadrant 12/15/2012 with revision after midline shift 12/28/2012 ---RLAS V/VI at this point 2. DVT Prophylaxis/Anticoagulation: SCDs. Dopplers negative 3. Numerous facial fractures. Conservative care per ENT  4. Mood: Ativan as needed, Inderal 80 mg every 8 hours.--begin to wean -sleep improved. -ritalin- continue for attention -enclosure bed still required 5. Neuropsych: This patient is not capable of making decisions on his own behalf.  6. Seizure disorder. Vimpat--taper to 50mg  bid--dc today continue Keppra, Dilantin.    7. Dysphagia. Gastrostomy PEG tube/ 12/31/2012 per trauma services.  -trach stoma healed  -diet D3  -off TF and H2O flushes except for maintenance---dc G tube 4-5 weeks post placement 8. Alcohol abuse. Counseling  9. Wound care: local care to PEG, crani, and abdominal sites.  -local care-- -helmet at some point--discuss with team 10. Temp: afebrile, stable leukocytosis -urine culture and cxr negative, dopplers negative. 11. Spasticity:   low dose baclofen has helped  -continued therapy, engagement of right side 12. Urinary  frequency  -timed voids, bladder education  -pt was continent this am with cues from staff    LOS (Days) 25 A FACE TO FACE EVALUATION WAS PERFORMED  SWARTZ,ZACHARY T 01/28/2013 7:15 AM

## 2013-01-28 NOTE — Progress Notes (Signed)
Occupational Therapy Session Note  Patient Details  Name: Albert [name] MRN: [id number] Date of Birth: May 07, 1976  Today's Date: 01/28/2013 Time: 0730-0830 Time Calculation (min): 60 min  Short Term Goals: Week 3:  OT Short Term Goal 1 (Week 3): Pt will indicate toileting needs with staff/ family member 50% of time (when with staff/family) OT Short Term Goal 2 (Week 3): Perform 3/3 toileting tasks with supervision OT Short Term Goal 3 (Week 3): Perform basic transfers with supervision OT Short Term Goal 4 (Week 3): Complete LB dressing with min A OT Short Term Goal 5 (Week 3): Pt will self feed with supervision with mod cuing for visual scanning  Skilled Therapeutic Interventions/Progress Updates:    1:1 self care retraining at shower level. Focus on functional ambulation around room with min to mod A of one caregiver, using contextual cues to sequencing through his morning routine (shower already running as cue to take shower, clothes available in bathroom on grab bar to don, sitting at sink to perform grooming). Pt oriented to hospital but not date/time or situation. Pt surprise when discussing "brain surgery" and "blood on brain"- however discussed how he felt drunk and even though he knows he is sober. Pt with increased talking about drinking and feeling drunk. PT able to self feed with supervision requiring less cuing and A to scoop/stab food for self feeding. Pt still with c/o visual disturbances but visual perceptual abilities are improving.   Therapy Documentation Precautions:  Precautions Precautions: Fall Precaution Comments: L crani, skull cap removed  and in abdomen Required Braces or Orthoses: Other Brace/Splint Other Brace/Splint: abdominal binder Restrictions Weight Bearing Restrictions: No  Pain:  no c/o pain   See FIM for current functional status  Therapy/Group: Individual Therapy  [name] [facility] 01/28/2013, 9:19 AM

## 2013-01-28 NOTE — Progress Notes (Signed)
NUTRITION FOLLOW UP  DOCUMENTATION CODES  Per approved criteria   -Severe malnutrition in the context of acute injury   Intervention:    Continue Ensure Complete, Resource Breeze, 30 ml Prostat.   RD to continue to assess enteral tolerance  Nutrition Dx:   Inadequate oral intake now related to variable meal intake AEB variable meal completion.  Goal:   Pt to meet >/= 90% of their estimated nutrition needs. Met.  Monitor:   weight, labs, PO intake, supplement acceptance  Assessment:   Pt admitted after fall, positive for ETOH. Has SDH and SAH, multiple facial fractures, s/p decompressive craniectomy of L SDH. Pt with questionable cardiac arrest with CPR. Pt extubated 4/14, developed seizures and required second craniectomy. Pt had trach and PEG 4/22 and extubated on 4/24 and remains on trach collar. Pt has had persistent fevers this admission due to neuro storming.   Working towards d/c date of 5/30. Upgraded to Regular textures on 5/19. Continues without bolus feedings. Receiving free water via tube for tube patency. All meds changed to oral by MD - ordered for Ensure Complete daily, 30 ml Prostat TID, and [employer] daily. Per chart, pt will take supplements variably.  Meal intake tends to vary greatly - consuming 50 - 75% of meals.  Height: Ht Readings from Last 1 Encounters:  01/03/13 6' (1.829 m)    Weight Status:   Wt Readings from Last 1 Encounters:  01/27/13 177 lb 14.6 oz (80.7 kg)  Wt variable. Wife at bedside reports usual weight PTA was around 200 - 205 lb. This is a weight loss of 16.5 % x 1 month.  Pt meets criteria for severe MALNUTRITION in the context of acute injury as evidenced by 16.5% wt loss x 1 month and moderate muscle wasting.  Re-estimated needs:  Kcal: 2500 - 2800 Protein: at least 120 grams protein Fluid: 2.5 - 2.8 liters daily  Skin: multiple incisions  Diet Order: General    Intake/Output Summary (Last 24 hours) at 01/28/13  1213 Last data filed at 01/28/13 0929  Gross per 24 hour  Intake    410 ml  Output      0 ml  Net    410 ml    Last BM: 5/18   Labs:  No results found for this basename: NA, K, CL, CO2, BUN, CREATININE, CALCIUM, MG, PHOS, GLUCOSE,  in the last 168 hours  CBG (last 3)  No results found for this basename: GLUCAP,  in the last 72 hours  Scheduled Meds: . baclofen  10 mg Oral BID  . feeding supplement  237 mL Oral Q24H  . feeding supplement  30 mL Oral TID WC  . feeding supplement  1 Container Oral Q24H  . free water  50 mL Per Tube Q12H  . levETIRAcetam  1,500 mg Oral BID  . methylphenidate  15 mg Oral BID WC  . multivitamin  5 mL Oral Daily  . pantoprazole  40 mg Oral Daily  . phenytoin  100 mg Oral TID  . propranolol  60 mg Oral TID  . saccharomyces boulardii  250 mg Oral BID    Continuous Infusions: none    Jarold Motto MS, RD, LDN Pager: [phone] After-hours pager: [phone]

## 2013-01-28 NOTE — Progress Notes (Signed)
Physical Therapy Note  Patient Details  Name: Albert [name] MRN: [id number] Date of Birth: 08/01/1976 Today's Date: 01/28/2013  Time 1: [phone] 60 minutes  1:1 No c/o pain.  Gait training throughout unit with +1 HHA with min-mod A. Pt with improved balance and decreased scissoring, continues with decreased coordination and timing with R LE.  Standing balance training with basketball toss with pt able to count and remember number of baskets he made.  Min A for balance.  Reaching activity for R UE coordination and standing balance with horseshoe activity.  Pt requires mod phonemic cuing for naming color of horseshoe, max-total A to name another object that is the same color.  Pt continues with language of confusion throughout treatment session.  Pt/family education with pt's wife for transfers and short distance ambulation. [name] demo'd safely with transfers and will require more training with short distance ambulation for her to become comfortable.  [name] expresses she is very pleased with pt's progress.   Time 2: 1300-1400 60 minutes  1:1 No c/o pain.  Pt c/o L knee feeling "funny", some minor swelling around patella noted, eased with rest.  Gait training outdoors on a variety of surfaces, incline/decline and stairs with min-mod A.  Pt with noted B ankle weakness which may contribute to knee pain.  Gait training in controlled environment multiple attempts with min HHA > 150'.   Kinetron for B LE strengthening and balance seated and standing 2 x 20 reps each with min A for standing balance.  Throughout session attempted to have pt decrease language of confusion, pt continues to require max-total A for using correct word to identify objects and ideas.  Ontario Pettengill 01/28/2013, 12:15 PM

## 2013-01-28 NOTE — Progress Notes (Signed)
Occupational Therapy Session Note  Patient Details  Name: Albert [name] MRN: [id number] Date of Birth: May 24, 1976  Today's Date: 01/28/2013 Time: 1400-1430 Time Calculation (min): 30 min  Short Term Goals: Week 4:     Skilled Therapeutic Interventions/Progress Updates:    1:1 Wife present continued to provide BI education as well as transfers and functional ambulation, importance of rest. Pt engaged in right UE strengthening and moving in normal patterns of movement with visual scanning with picking up clothespin and putting them on brightly colored paper. Engaged in playing UNO focus on verbal expression (performed better with numbers rather than colors), working memory of rules ot game, visual scanning and right hand use as dominant.   Therapy Documentation Precautions:  Precautions Precautions: Fall Precaution Comments: L crani, skull cap removed  and in abdomen Required Braces or Orthoses: Other Brace/Splint Other Brace/Splint: abdominal binder Restrictions Weight Bearing Restrictions: No General:   Vital Signs:   Pain: Pain Assessment Pain Assessment: No/denies pain  See FIM for current functional status  Therapy/Group: Individual Therapy  [name] [facility] 01/28/2013, 3:21 PM

## 2013-01-29 ENCOUNTER — Inpatient Hospital Stay (HOSPITAL_COMMUNITY): Payer: BC Managed Care – PPO | Admitting: Physical Therapy

## 2013-01-29 ENCOUNTER — Inpatient Hospital Stay (HOSPITAL_COMMUNITY): Payer: BC Managed Care – PPO | Admitting: Occupational Therapy

## 2013-01-29 ENCOUNTER — Encounter (HOSPITAL_COMMUNITY): Payer: BC Managed Care – PPO | Admitting: Occupational Therapy

## 2013-01-29 ENCOUNTER — Inpatient Hospital Stay (HOSPITAL_COMMUNITY): Payer: BC Managed Care – PPO | Admitting: Speech Pathology

## 2013-01-29 MED ORDER — PROPRANOLOL HCL 40 MG PO TABS
40.0000 mg | ORAL_TABLET | Freq: Three times a day (TID) | ORAL | Status: DC
  Administered 2013-01-29 – 2013-02-02 (×15): 40 mg via ORAL
  Filled 2013-01-29 (×21): qty 1

## 2013-01-29 NOTE — Progress Notes (Signed)
Orthopedic Tech Progress Note Patient Details:  Albert [name] 1976-01-24 [id number] Brace order completed by [facility]. Patient ID: Albert [name], male   DOB: 1975-12-17, 37 y.o.   MRN: [id number]   Jennye Moccasin 01/29/2013, 3:21 PM

## 2013-01-29 NOTE — Progress Notes (Signed)
Orthopedic Tech Progress Note Patient Details:  Albert [name] [birth year] [id number]  Patient ID: [name], male   DOB: March 23, 1976, 37 y.o.   MRN: [id number]   [name] 01/29/2013, 8:16 AM Called [employer] for soft helmet.

## 2013-01-29 NOTE — Progress Notes (Signed)
Physical Therapy Note  Patient Details  Name: Albert [name] MRN: [id number] Date of Birth: 1975-11-18 Today's Date: 01/29/2013  Time 1: [phone] 60 minutes co-tx with TR  1:1 No c/o pain.  Standing balance training on foam while playing UNO card game.  Pt able to stand on foam without UE support while engaging in cognitive and B hand task 2 x 8 minutes with min A.  Pt with improving hip and ankle balance strategies.  Pt is able to identify numbers and colors of cards with mod-max cuing, improved B UE coordination as he is able to place cards on pile and draw new cards with set up assist.  Standing balance for reaching activities with min A for balance, min cuing to locate objects due to visual perceptual deficits.  Gait while attempting to name holidays, pt requires total A for naming, min A for gait with +1 HHA.   Time 2:  1045-1115 30 minutes  1:1 No c/o pain.  Attempt quadruped exercise, pt able to get into quadruped but c/o fatigue in UEs with maintaining position.  Pt performed supine UE therex with 5# dowel for chest, arm and shoulder strengthening with hand over hand assist fading to supervision.  Pt continues with language of confusion but is able to be redirected with minimal cues.  Albert [name] 01/29/2013, 9:56 AM

## 2013-01-29 NOTE — Progress Notes (Signed)
Speech Language Pathology Daily Session Note  Patient Details  Name: Albert [name] MRN: [id number] Date of Birth: October 30, 1975  Today's Date: 01/29/2013 Time: 7829-5621 Time Calculation (min): 40 min  Short Term Goals: Week 4: SLP Short Term Goal 1 (Week 4): Patient will demonstrate selective attention to task for 15 minutes with Min assist verbal and tactile cues.   SLP Short Term Goal 2 (Week 4): Patient will consume Regular textures and thin liquids via straw with no overt s/s of aspiration and Supervision verbal cues. SLP Short Term Goal 3 (Week 4): Patient will solve basic, familiar problems with Min assist multi-modal clinician cues. SLP Short Term Goal 4 (Week 4): Patient will request help as needed during daily tasks with Min assist question cues. SLP Short Term Goal 5 (Week 4): Patient will self-monitor and correct verbal errors with Max assist clinician cues. SLP Short Term Goal 6 (Week 4): Patient will utilize working memory strategies with Max assist multi-modal cues.  Skilled Therapeutic Interventions: Skilled treatment session focused on addressing cognitive-linguistic goals. SLP facilitated session with structured naming task and Max assist multi-modal cues to name basic ADL objects with Total faded to Max assist to question cues and repeats to clarify verbal expression.  Patient with improved self-monitoring of verbal expression x2 today, "Wow that sounds crazy." However unable to correct errors.  Continue with current plan of care.   FIM:  Comprehension Comprehension Mode: Auditory Comprehension: 4-Understands basic 75 - 89% of the time/requires cueing 10 - 24% of the time Expression Expression Mode: Verbal Expression: 3-Expresses basic 50 - 74% of the time/requires cueing 25 - 50% of the time. Needs to repeat parts of sentences. Social Interaction Social Interaction: 5-Interacts appropriately 90% of the time - Needs monitoring or encouragement for participation or  interaction. Problem Solving Problem Solving: 4-Solves basic 75 - 89% of the time/requires cueing 10 - 24% of the time Memory Memory: 2-Recognizes or recalls 25 - 49% of the time/requires cueing 51 - 75% of the time FIM - Eating Eating Activity: 5: Needs verbal cues/supervision  Pain Pain Assessment Pain Assessment: No/denies pain  Therapy/Group: Individual Therapy  Albert [name]., CCC-SLP [phone]  Albert [name] 01/29/2013, 4:28 PM

## 2013-01-29 NOTE — Progress Notes (Signed)
Social Work Patient ID: Albert [name], male   DOB: 05-Apr-1976, 37 y.o.   MRN: [id number] Met with pt and mom while here today to attend therapies with pt, to inform of team conference and progression toward goals and still on track for  Discharge 5/30.  Wife doing hands on care and mom here to observe today.  Aware Valentina Gu to return on Monday.

## 2013-01-29 NOTE — Progress Notes (Signed)
Occupational Therapy Session Note  Patient Details  Name: Albert [name] MRN: [id number] Date of Birth: 1976-05-27  Today's Date: 01/29/2013 Time: 3244-0102 Time Calculation (min): 44 min  Skilled Therapeutic Interventions/Progress Updates:    Worked on therapeutic activity involving balance, RUE functional use, vision and cognition.  Had pt work in standing to retrieve balls from the floor and tossing to a target with the right UE.  Pt needing min to mod instructional cueing to pick up balls with the right hand instead of the left.  Pt needing mod instructional cueing to scan to the right to locate objects or balls he had already tossed.  Min assist for balance to mobilize and pick them up.  Pt unable to state place or situation. Still using word insertion frequently when asked questions and attempting to answer them.  Pt able to keep track of simple addition 5,10,15 while performing activity as well.  Still with moderate coordination and functional deficits in the right arm when using for activity as well.  Therapy Documentation Precautions:  Precautions Precautions: Fall Precaution Comments: L crani, skull cap removed  and in abdomen Required Braces or Orthoses: Other Brace/Splint Other Brace/Splint: abdominal binder Restrictions Weight Bearing Restrictions: No General:   Vital Signs: Therapy Vitals Temp: 98.4 F (36.9 C) Temp src: Oral Pulse Rate: 78 Resp: 20 BP: 115/75 mmHg Patient Position, if appropriate: Sitting Oxygen Therapy SpO2: 98 % Pain:  No report of pain during session  ADL: See FIM for current functional status  Therapy/Group: Individual Therapy  Toby Breithaupt OTR/L 01/29/2013, 4:01 PM

## 2013-01-29 NOTE — Progress Notes (Signed)
Occupational Therapy Session Note  Patient Details  Name: Albert [name] MRN: [id number] Date of Birth: June 12, 1976  Today's Date: 01/29/2013 Time: 0730-0830 Time Calculation (min): 60 min  Skilled Therapeutic Interventions/Progress Updates:    1:1 self care retraining with focus on functional ambulation, standing balance, balance reactions with LOB, sit to stands, sequencing and task organization, sit to stand, bilateral UE coordination, discussion about orientation information, family and friends. Pt able to perform toothbrushing once toothpaste was applied without A. Pt able to self feed himself breakfast with extra time an mod cuing to locate food on utensil and only one time needed a to stab food.  Therapy Documentation Precautions:  Precautions Precautions: Fall Precaution Comments: L crani, skull cap removed  and in abdomen Required Braces or Orthoses: Other Brace/Splint Other Brace/Splint: abdominal binder Restrictions Weight Bearing Restrictions: No Pain: No c/o pain See FIM for current functional status  Therapy/Group: Individual Therapy  [name] [facility] 01/29/2013, 11:16 AM

## 2013-01-29 NOTE — Progress Notes (Signed)
Occupational Therapy Session Note  Patient Details  Name: Albert [name] MRN: [id number] Date of Birth: 1975/10/28  Today's Date: 01/29/2013 Time: 1000-1030 Time Calculation (min): 30 min  Skilled Therapeutic Interventions/Progress Updates:    Took [name] down to the [employer] worked on identifying object initially such as bowl, or cup from wheelchair level.  Pt only correct on 1/4 objects and would usually use word insertion or language of confusion to answer questions.  Transitioned to standing at the sink with min guard assist and having put use the RUE to pick up items off of the counter and re-organize up on the first/upper shelf.  Pt able to categoriize all soup cans with mod questioning cues including scanning to the right to locate all of the items.  Would at times be distracted with internal conversation and would need mod instructional cueing to continue with task.  Occasional need for min assist to place items in the back of the cabinet with the RUE secondary to flexion tone.  Pt voiced enjoyment with performing activity.  Therapy Documentation Precautions:  Precautions Precautions: Fall Precaution Comments: L crani, skull cap removed  and in abdomen Required Braces or Orthoses: Other Brace/Splint Other Brace/Splint: abdominal binder Restrictions Weight Bearing Restrictions: No  Pain: Pain Assessment Pain Score: 0-No pain ADL: See FIM for current functional status  Therapy/Group: Individual Therapy  Johnda Billiot OTR/L 01/29/2013, 12:42 PM

## 2013-01-29 NOTE — Progress Notes (Signed)
Patient ID: Albert [name], male   DOB: 02/11/1976, 37 y.o.   MRN: [id number] Subjective/Complaints: Got out of enclosure bed. Urinated on floor. No injury,fall A 12 point review of systems has been performed and if not noted above is otherwise negative.     Objective: Vital Signs: Blood pressure 128/77, pulse 67, temperature 97.7 F (36.5 C), temperature source Oral, resp. rate 20, height 6' (1.829 m), weight 80.7 kg (177 lb 14.6 oz), SpO2 99.00%. No results found. No results found for this basename: WBC, HGB, HCT, PLT,  in the last 72 hours No results found for this basename: NA, K, CL, CO, GLUCOSE, BUN, CREATININE, CALCIUM,  in the last 72 hours CBG (last 3)  No results found for this basename: GLUCAP,  in the last 72 hours  Wt Readings from Last 3 Encounters:  01/27/13 80.7 kg (177 lb 14.6 oz)  01/03/13 83.3 kg (183 lb 10.3 oz)  01/03/13 83.3 kg (183 lb 10.3 oz)    Physical Exam:  Eyes:  Pupils reactive to light  Neck: Neck supple. No thyromegaly present.  Cardiovascular: Normal rate and regular rhythm.  Pulmonary/Chest: Effort normal and breath sounds normal. No respiratory distress.  Abdominal: Soft. Bowel sounds are normal. He exhibits no distension.  Neurological:     RUE tone still is trace to 1. RUE is grossly 4/5. RLE is 4 prox to 2-3/5 APF and 0/tr ADF Senses pain on right. DTR's 3+. Answers simple questions. Limited insight and awareness. Confabulates. Remembered my name. Appears have visual field deficits, but inconsistent on exam. I'm in [place]. Can't tell me where. Recalls wife's name Skin:  Craniotomy site and donor sites intact--staples all are out.   Assessment/Plan: 1. Functional deficits secondary to severe left SDH, SAH s/p craniectomy,  which require 3+ hours per day of interdisciplinary therapy in a comprehensive inpatient rehab setting. Physiatrist is providing close team supervision and 24 hour management of active medical problems listed below. Physiatrist  and rehab team continue to assess barriers to discharge/monitor patient progress toward functional and medical goals.   Will need formal neuro-optho eval after dc. RLAS V-VI currently. Consider Right AFO FIM: FIM - Bathing Bathing Steps Patient Completed: Chest;Right Arm;Left Arm;Abdomen;Front perineal area;Right upper leg;Left upper leg;Buttocks Bathing: 4: Min-Patient completes 8-9 72f 10 parts or 75+ percent  FIM - Upper Body Dressing/Undressing Upper body dressing/undressing steps patient completed: Thread/unthread right sleeve of pullover shirt/dresss;Thread/unthread left sleeve of pullover shirt/dress;Put head through opening of pull over shirt/dress;Pull shirt over trunk Upper body dressing/undressing: 4: Min-Patient completed 75 plus % of tasks FIM - Lower Body Dressing/Undressing Lower body dressing/undressing steps patient completed: Pull pants up/down;Thread/unthread right underwear leg;Thread/unthread left underwear leg;Pull underwear up/down;Thread/unthread right pants leg;Thread/unthread left pants leg;Don/Doff left sock;Don/Doff right shoe;Don/Doff right sock;Don/Doff left shoe Lower body dressing/undressing: 4: Min-Patient completed 75 plus % of tasks  FIM - Toileting Toileting steps completed by patient: Performs perineal hygiene;Adjust clothing after toileting Toileting Assistive Devices: Grab bar or rail for support Toileting: 3: Mod-Patient completed 2 of 3 steps  FIM - Diplomatic Services operational officer Devices: Grab bars Toilet Transfers: 4-To toilet/BSC: Min A (steadying Pt. > 75%);4-From toilet/BSC: Min A (steadying Pt. > 75%)  FIM - Bed/Chair Transfer Bed/Chair Transfer Assistive Devices: Bed rails Bed/Chair Transfer: 4: Chair or W/C > Bed: Min A (steadying Pt. > 75%);4: Bed > Chair or W/C: Min A (steadying Pt. > 75%)  FIM - Locomotion: Wheelchair Locomotion: Wheelchair: 1: Total Assistance/staff pushes wheelchair (Pt<25%) FIM - Locomotion:  Ambulation Locomotion: Ambulation Assistive Devices: Other (comment) (Bilateral HHA) Ambulation/Gait Assistance: 1: +2 Total assist Locomotion: Ambulation: 3: Travels 150 ft or more with moderate assistance (Pt: 50 - 74%)  Comprehension Comprehension Mode: Auditory Comprehension: 4-Understands basic 75 - 89% of the time/requires cueing 10 - 24% of the time  Expression Expression Mode: Verbal Expression Assistive Devices: 6-Other (Comment) Expression: 3-Expresses basic 50 - 74% of the time/requires cueing 25 - 50% of the time. Needs to repeat parts of sentences.  Social Interaction Social Interaction Mode: Asleep Social Interaction: 4-Interacts appropriately 75 - 89% of the time - Needs redirection for appropriate language or to initiate interaction.  Problem Solving Problem Solving: 3-Solves basic 50 - 74% of the time/requires cueing 25 - 49% of the time  Memory Memory: 2-Recognizes or recalls 25 - 49% of the time/requires cueing 51 - 75% of the time  Medical Problem List and Plan:  1. 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 Status post craniotomy evacuation hematoma with insertion of bone flap abdominal left upper quadrant 12/15/2012 with revision after midline shift 12/28/2012 ---RLAS V/VI at this point 2. DVT Prophylaxis/Anticoagulation: SCDs. Dopplers negative 3. Numerous facial fractures. Conservative care per ENT  4. Mood: Ativan as needed, Inderal wean to 40mg  q8 -sleep improved. -ritalin- continue for attention -enclosure bed still required 5. Neuropsych: This patient is not capable of making decisions on his own behalf.  6. Seizure disorder. keppra and dilantin on board. Wean as outpt    7. Dysphagia. Gastrostomy PEG tube/ 12/31/2012 per trauma services.  -trach stoma healed  -diet D3  -off TF and H2O flushes except for maintenance---dc G tube next week prior to dc. 8. Alcohol abuse. Counseling  9. Wound care: local care to  PEG, crani, and abdominal sites.  -local care-- -helmet requested. [facility] orthotics to see today 10. Temp: afebrile, stable leukocytosis -urine culture and cxr negative, dopplers negative. 11. Spasticity:   low dose baclofen has helped  -continued therapy, engagement of right side 12. Urinary frequency  -timed voids, bladder education  -working on voiding patterns.     LOS (Days) 26 A FACE TO FACE EVALUATION WAS PERFORMED  SWARTZ,ZACHARY T 01/29/2013 7:14 AM

## 2013-01-29 NOTE — Progress Notes (Signed)
Inpatient RehabilitationTeam Conference and Plan of Care Update Date: 01/28/2013   Time: 3:12 PM    Patient Name: Albert [name]      Medical Record Number: [id number]  Date of Birth: June 08, 1976 Sex: Male         Room/Bed: 4002/4002-01 Payor Info: Payor: BLUE CROSS BLUE [employer]IELD / Plan: [employer] [place] PPO / Product Type: *No Product type* /    Admitting Diagnosis: rt femur fx  Admit Date/Time:  01/03/2013  3:23 PM Admission Comments: No comment available   Primary Diagnosis:  TBI (traumatic brain injury) Principal Problem: TBI (traumatic brain injury)  Patient Active Problem List   Diagnosis Date Noted  . Closed fracture of facial bones 01/03/2013  . TBI (traumatic brain injury) 01/03/2013  . Pneumonia 01/03/2013  . Acute blood loss anemia 12/30/2012  . Subdural hemorrhage following injury 12/15/2012  . Respiratory failure following trauma and surgery 12/15/2012  . Cardiac arrest 12/15/2012  . Blunt trauma of face 12/15/2012  . Blunt head trauma 12/15/2012  . Traumatic subarachnoid bleed with LOC of 30 minutes or less 12/15/2012  . Elevated ETOH level 12/15/2012    Expected Discharge Date: Expected Discharge Date: 02/07/13  Team Members Present:  Dr. Riley Kill, Kriste Basque Dupree SW, Melanee Spry RN, Laural Roes RN, Feliberto Gottron ST, Blenda Peals OT, Rose Fillers PT, Mackie Pai Supervisor, Reggy Eye PT, Bayard Hugger RN Director, Tora Duck PPS Coordinator.       Current Status/Progress Goal Weekly Team Focus  Medical   continues to progress, visualspatial deficits. ?helemt  see prior, improve balance, insight  see prior,above   Bowel/Bladder   Continent of bowel and bladder during day wears brief hs for incontinent episodes  Min assist  Timed tolieting   Swallow/Nutrition/ Hydration   Regular textures and thin liquids  least restrictive p.o. intake  trials of thin via straw   ADL's   min A to steady A   supervision to min A  UEs coordination, transfers, family education ,  standing balance, orientation, simple problem solving, functional communication, self awareness   Mobility   min A transfers, mod A gait  min A   awareness, balance, gait, family ed   Communication   Min-Mod expressive but total assist to self-monitor and correct  mod assist overall  increase overall awareness and self-monitoring   Safety/Cognition/ Behavioral Observations  mod assist  min assist  continue to expand attention, orientation, awarenss and carryover   Pain   No sign,symptoms or complaints of pain     Monitor   Skin   Bilateral abdominal incision open to air , healing trach site, no s/s of infection/breakdown  No new skin breakdown  Monitor skin for infection/breakdown      *See Care Plan and progress notes for long and short-term goals.  Barriers to Discharge: balance    Possible Resolutions to Barriers:  family ed, cognitive behavior therapy    Discharge Planning/Teaching Needs:  Home with wife and parents providing 24 hr care      Team Discussion:  Needs helmet-begin to encourage him to wear it-increased continence-working toward more consistent balance-family ed has begun.  Revisions to Treatment Plan:  TFs d/c'd-probably will d/c PEG close to d/c from Rehab   Continued Need for Acute Rehabilitation Level of Care: The patient requires daily medical management by a physician with specialized training in physical medicine and rehabilitation for the following conditions: Daily direction of a multidisciplinary physical rehabilitation program to ensure safe treatment while eliciting the highest  outcome that is of practical value to the patient.: Yes Daily medical management of patient stability for increased activity during participation in an intensive rehabilitation regime.: Yes Daily analysis of laboratory values and/or radiology reports with any subsequent need for medication adjustment of medical intervention for : Other;Post surgical problems;Neurological  problems  Brock Ra 01/29/2013, 5:40 PM

## 2013-01-29 NOTE — Progress Notes (Signed)
Recreational Therapy Session Note  Patient Details  Name: Albert [name] MRN: [id number] Date of Birth: [birth year] Today's Date: 01/29/2013  Pt participated in animal assisted activity/therapy seated w/c level at the nurses station with supervision.  Pt independently initiated and incorporated use of RUE to pet dog.  [name] 01/29/2013, 4:49 PM

## 2013-01-29 NOTE — Progress Notes (Signed)
Recreational Therapy Session Note  Patient Details  Name: Albert [name] MRN: [id number]  Date of Birth: [birth year] Today's Date: 01/29/2013 Time:  905-10 Pain: no c/o Skilled Therapeutic Interventions/Progress Updates: Session focused on dynamic standing balance, selective attention, problem solving, color & number identification, and BUE use.  Pt stood on foam mat while playing modified UNO card game.  Pt required min assist for balance 2 x 8 minutes.  Pt requires mod-max cues for number identification and mod cues for color identification.  Pt requires min cues for visual scanning during game.  Pt ambulated with min assist while attempting to name holidays with total assist.  Therapy/Group: Co-Treatment   Balinda Heacock 01/29/2013, 11:52 AM

## 2013-01-29 NOTE — Progress Notes (Signed)
Orthopedic Tech Progress Note Patient Details:  Albert [name] 10/28/1975 [id number]  Patient ID: Albert [name], male   DOB: 1976/06/04, 37 y.o.   MRN: [id number]   Albert [name] 01/29/2013, 8:52 AM Called [facility] for soft helmet.

## 2013-01-30 ENCOUNTER — Inpatient Hospital Stay (HOSPITAL_COMMUNITY): Payer: BC Managed Care – PPO | Admitting: Occupational Therapy

## 2013-01-30 ENCOUNTER — Encounter (HOSPITAL_COMMUNITY): Payer: BC Managed Care – PPO | Admitting: Occupational Therapy

## 2013-01-30 ENCOUNTER — Inpatient Hospital Stay (HOSPITAL_COMMUNITY): Payer: BC Managed Care – PPO | Admitting: Physical Therapy

## 2013-01-30 ENCOUNTER — Inpatient Hospital Stay (HOSPITAL_COMMUNITY): Payer: BC Managed Care – PPO | Admitting: Speech Pathology

## 2013-01-30 ENCOUNTER — Inpatient Hospital Stay (HOSPITAL_COMMUNITY): Payer: BC Managed Care – PPO | Admitting: *Deleted

## 2013-01-30 MED ORDER — ACETAMINOPHEN 325 MG PO TABS
650.0000 mg | ORAL_TABLET | Freq: Four times a day (QID) | ORAL | Status: DC | PRN
  Administered 2013-01-30 – 2013-02-06 (×7): 650 mg via ORAL
  Filled 2013-01-30 (×7): qty 2

## 2013-01-30 NOTE — Progress Notes (Signed)
Occupational Therapy Session Note  Patient Details  Name: Albert [name] MRN: [id number] Date of Birth: [birth year]  Today's Date: 01/30/2013 Time: 0730-0830 Time Calculation (min): 60 min  Short Term Goals: Week 3:  OT Short Term Goal 1 (Week 3): Pt will indicate toileting needs with staff/ family member 50% of time (when with staff/family) OT Short Term Goal 2 (Week 3): Perform 3/3 toileting tasks with supervision OT Short Term Goal 3 (Week 3): Perform basic transfers with supervision OT Short Term Goal 4 (Week 3): Complete LB dressing with min A OT Short Term Goal 5 (Week 3): Pt will self feed with supervision with mod cuing for visual scanning  Skilled Therapeutic Interventions/Progress Updates:    1:1 Pt awake in bed when arrived. Encourage pt to get "up for breakfast; demonstrated functional ambulation to bathroom to void with extra time and min cues for attention, transitioned to shower (which was already turned on - pt is showing more temperature regulation difficulties- easier transition if shower is already turned on.) Pt washed with mod cuing for attention and to remain on task. Pt with intellectual awareness that his legs and arms "dont quiet move right and my vision is weird." Pt received helmet yesterday - focus on education on situation and his brain surgery and needing to protect his head- visual input of looking in mirror at head. OT referred to helmet as "hat" Pt willing to don "hat" twice and keep it on for brief pds of time due to decr attention and memory. Trying to incorporate "hat" with mobility to protect head.  Therapy Documentation Precautions:  Precautions Precautions: Fall Precaution Comments: L crani, skull cap removed  and in abdomen Required Braces or Orthoses: Other Brace/Splint Other Brace/Splint: abdominal binder Restrictions Weight Bearing Restrictions: No Pain:  no c/o pain  See FIM for current functional status  Therapy/Group: Individual  Therapy  [name] [facility] 01/30/2013, 11:02 AM

## 2013-01-30 NOTE — Progress Notes (Signed)
Occupational Therapy Session Note  Patient Details  Name: Albert [name] MRN: [id number] Date of Birth: [birth year]  Today's Date: 01/30/2013 Time: 1300-1330 Time Calculation (min): 30 min  Short Term Goals: Week 3:  OT Short Term Goal 1 (Week 3): Pt will indicate toileting needs with staff/ family member 50% of time (when with staff/family) OT Short Term Goal 2 (Week 3): Perform 3/3 toileting tasks with supervision OT Short Term Goal 3 (Week 3): Perform basic transfers with supervision OT Short Term Goal 4 (Week 3): Complete LB dressing with min A OT Short Term Goal 5 (Week 3): Pt will self feed with supervision with mod cuing for visual scanning  Skilled Therapeutic Interventions/Progress Updates:    Pt seen for 1:1 OT with focus on RUE functional use, vision, and cognition with simple card tasks.  Engaged pt in naming cards and naming which was higher, flipping cards over with RUE, and BUE to stack cards neatly.  Modified game of war with pt requiring mod cues to scan to right to see cards and frequent cues for use of RUE.  Pt with moderate coordination deficits in RUE, requiring increased time with flipping cards and stacking cards.  Pt continues to demonstrate language of confusion and confabulation with verbalizations.  Therapy Documentation Precautions:  Precautions Precautions: Fall Precaution Comments: Lt crani, skull cap removed and in abdomen Required Braces or Orthoses: Other Brace/Splint Other Brace/Splint: Helmet  Restrictions Weight Bearing Restrictions: No Pain: Pain Assessment Pain Assessment: No/denies pain  See FIM for current functional status  Therapy/Group: Individual Therapy  Leonette Monarch 01/30/2013, 2:49 PM

## 2013-01-30 NOTE — Progress Notes (Signed)
Patient ID: Albert [name], male   DOB: 18-Apr-1976, 37 y.o.   MRN: [id number] Subjective/Complaints: Resting comfortably. "i was up late last night watching football" A 12 point review of systems has been performed and if not noted above is otherwise negative.     Objective: Vital Signs: Blood pressure 113/83, pulse 82, temperature 97.8 F (36.6 C), temperature source Oral, resp. rate 20, height 6' (1.829 m), weight 81.5 kg (179 lb 10.8 oz), SpO2 100.00%. No results found. No results found for this basename: WBC, HGB, HCT, PLT,  in the last 72 hours No results found for this basename: NA, K, CL, CO, GLUCOSE, BUN, CREATININE, CALCIUM,  in the last 72 hours CBG (last 3)  No results found for this basename: GLUCAP,  in the last 72 hours  Wt Readings from Last 3 Encounters:  01/29/13 81.5 kg (179 lb 10.8 oz)  01/03/13 83.3 kg (183 lb 10.3 oz)  01/03/13 83.3 kg (183 lb 10.3 oz)    Physical Exam:  Eyes:  Pupils reactive to light  Neck: Neck supple. No thyromegaly present.  Cardiovascular: Normal rate and regular rhythm.  Pulmonary/Chest: Effort normal and breath sounds normal. No respiratory distress.  Abdominal: Soft. Bowel sounds are normal. He exhibits no distension.  Neurological:     RUE tone still is trace to 1. RUE is grossly 4/5. RLE is 4 prox to 2-3/5 APF and 0/tr ADF Senses pain on right. DTR's 3+. Answers simple questions. Limited insight and awareness. Confabulates. Remembered my name. Appears have visual field deficits, but inconsistent on exam. I'm in [place]. Can't tell me where. Recalls wife's name Skin:  Craniotomy site and donor sites intact--staples all are out.   Assessment/Plan: 1. Functional deficits secondary to severe left SDH, SAH s/p craniectomy,  which require 3+ hours per day of interdisciplinary therapy in a comprehensive inpatient rehab setting. Physiatrist is providing close team supervision and 24 hour management of active medical problems listed  below. Physiatrist and rehab team continue to assess barriers to discharge/monitor patient progress toward functional and medical goals.  Helmet delivered-will work on getting him accustomed to it.  Will need formal neuro-optho eval after dc. RLAS V-VI currently. Consider Right AFO FIM: FIM - Bathing Bathing Steps Patient Completed: Chest;Right Arm;Left Arm;Abdomen;Front perineal area;Right upper leg;Left upper leg;Buttocks Bathing: 4: Min-Patient completes 8-9 63f 10 parts or 75+ percent  FIM - Upper Body Dressing/Undressing Upper body dressing/undressing steps patient completed: Thread/unthread right sleeve of pullover shirt/dresss;Thread/unthread left sleeve of pullover shirt/dress;Put head through opening of pull over shirt/dress;Pull shirt over trunk Upper body dressing/undressing: 4: Min-Patient completed 75 plus % of tasks FIM - Lower Body Dressing/Undressing Lower body dressing/undressing steps patient completed: Pull pants up/down;Thread/unthread right underwear leg;Thread/unthread left underwear leg;Pull underwear up/down;Thread/unthread right pants leg;Thread/unthread left pants leg;Don/Doff left sock;Don/Doff right shoe;Don/Doff right sock;Don/Doff left shoe Lower body dressing/undressing: 4: Min-Patient completed 75 plus % of tasks  FIM - Toileting Toileting steps completed by patient: Performs perineal hygiene;Adjust clothing after toileting Toileting Assistive Devices: Grab bar or rail for support Toileting: 3: Mod-Patient completed 2 of 3 steps  FIM - Diplomatic Services operational officer Devices: Grab bars Toilet Transfers: 4-To toilet/BSC: Min A (steadying Pt. > 75%);4-From toilet/BSC: Min A (steadying Pt. > 75%)  FIM - Bed/Chair Transfer Bed/Chair Transfer Assistive Devices: Bed rails Bed/Chair Transfer: 4: Chair or W/C > Bed: Min A (steadying Pt. > 75%);4: Bed > Chair or W/C: Min A (steadying Pt. > 75%)  FIM - Locomotion: Wheelchair Locomotion: Wheelchair: 1:  Total Assistance/staff pushes wheelchair (Pt<25%) FIM - Locomotion: Ambulation Locomotion: Ambulation Assistive Devices: Other (comment) (Bilateral HHA) Ambulation/Gait Assistance: 1: +2 Total assist Locomotion: Ambulation: 4: Travels 150 ft or more with minimal assistance (Pt.>75%)  Comprehension Comprehension Mode: Auditory Comprehension: 4-Understands basic 75 - 89% of the time/requires cueing 10 - 24% of the time  Expression Expression Mode: Verbal Expression Assistive Devices: 6-Other (Comment) Expression: 3-Expresses basic 50 - 74% of the time/requires cueing 25 - 50% of the time. Needs to repeat parts of sentences.  Social Interaction Social Interaction Mode: Asleep Social Interaction: 5-Interacts appropriately 90% of the time - Needs monitoring or encouragement for participation or interaction.  Problem Solving Problem Solving: 4-Solves basic 75 - 89% of the time/requires cueing 10 - 24% of the time  Memory Memory: 2-Recognizes or recalls 25 - 49% of the time/requires cueing 51 - 75% of the time  Medical Problem List and Plan:  1. 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 Status post craniotomy evacuation hematoma with insertion of bone flap abdominal left upper quadrant 12/15/2012 with revision after midline shift 12/28/2012 ---RLAS V/VI at this point 2. DVT Prophylaxis/Anticoagulation: SCDs. Dopplers negative 3. Numerous facial fractures. Conservative care per ENT  4. Mood: Ativan as needed, Inderal wean to 40mg  q8 -sleep improved. -ritalin- continue for attention -enclosure bed still required 5. Neuropsych: This patient is not capable of making decisions on his own behalf.  6. Seizure disorder. keppra and dilantin on board. Wean as outpt    7. Dysphagia. Gastrostomy PEG tube/ 12/31/2012 per trauma services.  -trach stoma healed  -diet D3  -off TF and H2O flushes except for maintenance---dc G tube next week prior to dc. 8.  Alcohol abuse. Counseling  9. Wound care: local care to PEG, crani, and abdominal sites.  -local care-- -helmet   10. Temp: afebrile, stable leukocytosis -urine culture and cxr negative, dopplers negative. 11. Spasticity:   low dose baclofen has helped  -continued therapy, engagement of right side 12. Urinary frequency  -timed voids, bladder education  -working on voiding patterns.     LOS (Days) 27 A FACE TO FACE EVALUATION WAS PERFORMED  SWARTZ,ZACHARY T 01/30/2013 7:44 AM

## 2013-01-30 NOTE — Progress Notes (Signed)
Recreational Therapy Session Note  Patient Details  Name: Albert [name] MRN: [id number] Date of Birth: 02/16/1976 Today's Date: 01/30/2013 Time:  1330-1400 Pain: no c/o Skilled Therapeutic Interventions/Progress Updates: Attempted money management task, identifying coins but limited by vision.  When coins placed on table by size, largest to smallest (dime, nickel, quarter) pt able to identify appropriate coin when asked.  Pt ambulated >500 to vending machines in [place] tower with plans to purchase a drink and perform money management.  Pt declined purchasing a drink.  Pt ambulated with min-mod assist, 2nd person for safety.  Therapy/Group: Co-Treatment   Kori Colin 01/30/2013, 4:13 PM

## 2013-01-30 NOTE — Progress Notes (Signed)
Speech Language Pathology Daily Session Note  Patient Details  Name: Albert [name] MRN: [id number] Date of Birth: [birth year]  Today's Date: 01/30/2013 Time: 0930-1030 Time Calculation (min): 60 min  Short Term Goals: Week 4: SLP Short Term Goal 1 (Week 4): Patient will demonstrate selective attention to task for 15 minutes with Min assist verbal and tactile cues.   SLP Short Term Goal 2 (Week 4): Patient will consume Regular textures and thin liquids via straw with no overt s/s of aspiration and Supervision verbal cues. SLP Short Term Goal 3 (Week 4): Patient will solve basic, familiar problems with Min assist multi-modal clinician cues. SLP Short Term Goal 4 (Week 4): Patient will request help as needed during daily tasks with Min assist question cues. SLP Short Term Goal 5 (Week 4): Patient will self-monitor and correct verbal errors with Max assist clinician cues. SLP Short Term Goal 6 (Week 4): Patient will utilize working memory strategies with Max assist multi-modal cues.  Skilled Therapeutic Interventions: Skilled treatment session focused on addressing cognitive-linguistic goals. SLP facilitated session by requesting patient select photo described by SLP from a field of 4 with Supervision level question cues.  SLP also facilitated session by providing patient with photos and requesting he describe what people were doing; patient required Max assist sentence completion and phonemic cues for accuracy.  Patient with improved self-monitoring of verbal expression today with Max faded to Mod assist repetition and question cues.  Continue with current plan of care.   FIM:  Comprehension Comprehension Mode: Auditory Comprehension: 4-Understands basic 75 - 89% of the time/requires cueing 10 - 24% of the time Expression Expression Mode: Verbal Expression: 3-Expresses basic 50 - 74% of the time/requires cueing 25 - 50% of the time. Needs to repeat parts of sentences. Social Interaction Social  Interaction: 5-Interacts appropriately 90% of the time - Needs monitoring or encouragement for participation or interaction. Problem Solving Problem Solving: 4-Solves basic 75 - 89% of the time/requires cueing 10 - 24% of the time Memory Memory: 2-Recognizes or recalls 25 - 49% of the time/requires cueing 51 - 75% of the time FIM - Eating Eating Activity: 5: Needs verbal cues/supervision  Pain Pain Assessment Pain Assessment: No/denies pain  Therapy/Group: Individual Therapy  Charlane Ferretti., CCC-SLP [phone]  Albert [name] 01/30/2013, 12:32 PM

## 2013-01-30 NOTE — Progress Notes (Addendum)
Physical Therapy Session Note  Patient Details  Name: Albert [name] MRN: [id number] Date of Birth: [birth year]  Today's Date: 01/30/2013 Time: 4098-1191 Time Calculation (min): 59 min  Skilled Therapeutic Interventions/Progress Updates:   Static balance with LEs in normal stance then modified tandem while completing sorting task of 3 differently colored blocks with Rt. UE, worked on simple addition and multiplication. Task focused on static balance + cognitive distraction, sustained attention, short term memory, functional use of Rt. UE and attention to losses in visual field (> on Rt.). Min assist for balance, min to max verbal cues for cognitive task.  Ambulation x 150' with two persons for safety, min/mod assist for balance. Dynamic balance task locating and picking up bean bags from floor, mat and chair height then placing them in a container with other bean bags. Min to total verbal cues to locate bean bags and container, increased assist needed with fatigue. Pt at times able to kick bucket but not be able to "see" bucket (? 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   Static balance with lettered ball toss to pt's father, hand over hand assist initially for Rt. UE for coordination progressing to no assist for UEs. Min assist for balance. Pt progressed to being able to call out a letter found on the ball immediately after catching the ball.   Therapy Documentation Precautions:  Precautions Precautions: Fall Precaution Comments: Lt crani, skull cap removed and in abdomen Required Braces or Orthoses: Other Brace/Splint Other Brace/Splint: Helmet - pt not fond of, pt pulled "hat" off 3 x during therapy but willing to place it back on after encouragement. Restrictions Weight Bearing Restrictions: No Pain: Pain Assessment Pain Assessment: No/denies pain  See FIM for current functional status  Therapy/Group: Individual  Therapy  [name] 01/30/2013, 12:26 PM

## 2013-01-30 NOTE — Progress Notes (Signed)
Physical Therapy Note  Patient Details  Name: Albert [name] MRN: [id number] Date of Birth: [birth year] Today's Date: 01/30/2013  Time: 1330-1425 55 minutes (30 minutes co-tx with TR)  1:1 No c/o pain.  Pt gait > 500' x 2 with min-mod A, requires heavy mod A with sudden start/stops, speed and direction changes.  Attempt to have pt purchase drink from vending machine.  Pt requires mod questioning cues to identify different coins, max A to verbalize different beverage choices.  Pt with increased LOB when more fatigued with long distance gait, able to verbalize fatigue and need for rest.  Supine UE therex for chest and UE strengthening with cues for attention to task due to fatigue. Pt's mother/father educated on pt's progress and continuing PT goals, they express understanding.   Dore Oquin 01/30/2013, 2:28 PM

## 2013-01-31 ENCOUNTER — Inpatient Hospital Stay (HOSPITAL_COMMUNITY): Payer: BC Managed Care – PPO | Admitting: Physical Therapy

## 2013-01-31 ENCOUNTER — Inpatient Hospital Stay (HOSPITAL_COMMUNITY): Payer: BC Managed Care – PPO | Admitting: Speech Pathology

## 2013-01-31 ENCOUNTER — Inpatient Hospital Stay (HOSPITAL_COMMUNITY): Payer: BC Managed Care – PPO

## 2013-01-31 ENCOUNTER — Inpatient Hospital Stay (HOSPITAL_COMMUNITY): Payer: BC Managed Care – PPO | Admitting: *Deleted

## 2013-01-31 DIAGNOSIS — S069X9A Unspecified intracranial injury with loss of consciousness of unspecified duration, initial encounter: Secondary | ICD-10-CM

## 2013-01-31 NOTE — Progress Notes (Signed)
Physical Therapy Session Note  Patient Details  Name: Albert [name] MRN: [id number] Date of Birth: [birth year]  Today's Date: 01/31/2013 Time: 0100-0200 Time Calculation (min): 60 min  Short Term Goals: Week 2:  PT Short Term Goal 1 (Week 2): Patient will be able to perform bed mobility with Max-Assist. PT Short Term Goal 1 - Progress (Week 2): Met PT Short Term Goal 2 (Week 2): Patient will be able to perform transfers with Max-Assist. PT Short Term Goal 2 - Progress (Week 2): Met PT Short Term Goal 3 (Week 2): Patient will be able to sit EOB x 5 minutes with Mod-Assist PT Short Term Goal 3 - Progress (Week 2): Met Week 3:  PT Short Term Goal 1 (Week 3): Pt will perform bed mobility with supervision for supine to sit and min assist of one leg for sit to supine.   PT Short Term Goal 1 - Progress (Week 3): Met PT Short Term Goal 2 (Week 3): Transfers bed to [facility] on left mod assist.  Transfers WC to bed on right max assist PT Short Term Goal 2 - Progress (Week 3): Met PT Short Term Goal 3 (Week 3): Gait with LRAD >60' mod two person assist.   PT Short Term Goal 3 - Progress (Week 3): Met PT Short Term Goal 4 (Week 3): Pt will attend to singular task in minimally distracting envornment for > 5 mins with min cues to redirect.   PT Short Term Goal 4 - Progress (Week 3): Progressing toward goal PT Short Term Goal 5 (Week 3): Pt will perform static standing activity for > 5 mins with min assist for balance with upper extremity support.   PT Short Term Goal 5 - Progress (Week 3): Progressing toward goal  Skilled Therapeutic Interventions/Progress Updates:    Pt and family education provided utilizing the teach back method with verbal cues and return demonstration for gait techniques and safety recommendation for transfer training on various surfaces with varying heights and stairs training.Marland Kitchen Pt performed stair training in stairwell with family present and spouse assisting to learn techniques to  improve pt safety, pt stairs at home have R sided railing so training provided in accordance with home situation.  Pt displayed difficulty due to visual field deficits on R side making it difficult for pt to smoothly transition from step to step during ascension with pt demonstrating slight frustration increasing fall risk, provided constant cues to pt and family to improve understanding of recommendations for stair navigation and to decrease agitation, pt displayed slight decreased visual difficulty while  descending but required therapist assist for safe foot placement to progress through steps, pt and family will require continued education and performance of this task to improve proficiency.  Pt seen for gait training and pt and family education with spouse and parents performing return demonstration on instructions provided, continued training and reinforcement required to decrease fall risk and to decrease pt agitation during performance of task. Pt displays improved gait pattern with improved step through gait , clearance of floor and decrease ER of R LE. Pt performed standing balance activities while attempting to identify colors, shapes and object placement to improve identification, word finding and following of instructions, pt appeared slightly more agitated and frustrated today with increased fatigue noted, pt taken to quiet room to attempt to decrease mild frustration and pt improved, pt did however display increased difficulty following task and identifying colors and shapes, requiring increased cueing.  Nursing notified of increased difficulty and  will monitor for any concerns. Pt donned and doffed protective helmet several times and verbalized disliking it, pt and family education on recommended wearing of protective helmet with verbal acknowledgement.    Therapy Documentation Precautions:  Precautions Precautions: Fall Precaution Comments: Lt crani, skull cap removed and in abdomen Required  Braces or Orthoses: Other Brace/Splint Other Brace/Splint: Helmet  Restrictions Weight Bearing Restrictions: No   Pain: Pain Assessment Pain Assessment: No/denies pain       See FIM for current functional status  Therapy/Group: Individual Therapy  Jackelyn Knife 01/31/2013, 3:15 PM

## 2013-01-31 NOTE — Progress Notes (Signed)
Occupational Therapy Note  Patient Details  Name: Albert [name] MRN: [id number] Date of Birth: 01-Nov-1975 Today's Date: 01/31/2013 Time:  0800-0900  (60 min) Individual session Pain:  Complained of right left being tight, but no pain  Engaged in therapeutic bathing and dressing at shower level.  Addressed balance, transfers, functional mobility, problem solving, memory.  Pt required increased time to arouse and initiate getting out of bed.  Ambulated with minimal assist to toilet and then to shower.  Pt. Was minimal steadying assist with bathing.  Dressed shirt, pants, and right shoe with increased time  Needed assist with socks, and left shoe.  Pt. Shows sustained attention for 15 sec during bathing and dressing.  Continues to have language of confusion.  Has shown improvement with balance.  Left in wc with safety belt on and with NT.     Humberto Seals 01/31/2013, 9:04 AM

## 2013-01-31 NOTE — Progress Notes (Signed)
Speech Language Pathology Daily Session Note  Patient Details  Name: Albert [name] MRN: [id number] Date of Birth: 06/04/1976  Today's Date: 01/31/2013 Session 1 Time: 6962-9528 Time Calculation (min): 70 min Session 2 Time: 4132-4401 Time Calculation (min): 30 min  Short Term Goals: Week 4: SLP Short Term Goal 1 (Week 4): Patient will demonstrate selective attention to task for 15 minutes with Min assist verbal and tactile cues.   SLP Short Term Goal 2 (Week 4): Patient will consume Regular textures and thin liquids via straw with no overt s/s of aspiration and Supervision verbal cues. SLP Short Term Goal 3 (Week 4): Patient will solve basic, familiar problems with Min assist multi-modal clinician cues. SLP Short Term Goal 4 (Week 4): Patient will request help as needed during daily tasks with Min assist question cues. SLP Short Term Goal 5 (Week 4): Patient will self-monitor and correct verbal errors with Max assist clinician cues. SLP Short Term Goal 6 (Week 4): Patient will utilize working memory strategies with Max assist multi-modal cues.  Skilled Therapeutic Interventions: Session 1 Skilled treatment session focused on addressing cognitive-linguistic goals. SLP facilitated session by requesting patient select photo described by SLP from a field of 4 with Supervision level question cues.  SLP also facilitated session by providing patient with photos and requesting he describe what people were doing; patient accurately verbalized sentences in 8/12 opportunities required Mod assist sentence completion and phonemic cues for accuracy.  Patient with improved self-monitoring of verbal expression today with Max faded to Mod assist repetition and question cues.  SLP also facilitated session by educating wife regarding language impairment being a mixture of confusion as well as paraphasia errors with attention and visual-perceptual and self-monitoring also impacting function.  Continue with current  plan of care.   Session 2 Skilled treatment session focused on addressing cognitive-linguistic goals. Patient easily aroused from nap.  SLP facilitated session by providing patient with photos and requesting he describe what people were doing; patient accurately completed sentences in 50% of trials, decreased from this session this am and SLP suspects that fatigue/alertness impacted function this afternoon. Continue with current plan of care.  FIM:  Comprehension Comprehension Mode: Auditory Comprehension: 5-Understands basic 90% of the time/requires cueing < 10% of the time Expression Expression Mode: Verbal Expression: 3-Expresses basic 50 - 74% of the time/requires cueing 25 - 50% of the time. Needs to repeat parts of sentences. Social Interaction Social Interaction Mode: Asleep Social Interaction: 5-Interacts appropriately 90% of the time - Needs monitoring or encouragement for participation or interaction. Problem Solving Problem Solving: 4-Solves basic 75 - 89% of the time/requires cueing 10 - 24% of the time Memory Memory: 2-Recognizes or recalls 25 - 49% of the time/requires cueing 51 - 75% of the time FIM - Eating Eating Activity: 5: Needs verbal cues/supervision  Pain Pain Assessment Pain Assessment: No/denies pain x2  Therapy/Group: Individual Therapy  Charlane Ferretti., CCC-SLP [phone]  Kimmarie Pascale 01/31/2013, 12:47 PM

## 2013-01-31 NOTE — Progress Notes (Signed)
Physical Therapy Session Note  Patient Details  Name: Albert [name] MRN: [id number] Date of Birth: Aug 21, 1976  Today's Date: 01/31/2013 Time: 1040-1140 Time Calculation (min): 60 min  Skilled Therapeutic Interventions/Progress Updates:    Sorting task with colored blocks in standing utilizing Rt. UE, similar task to yesterday session to see if pt able to retain carryover and to continue to work on balance during cognitive task, sustained/selective attention, appropriate word usage, short term memory. Pt able to perform task with less cues, increased speed, and improved ability to perform simple math with blocks. Pt needing supervision with normal stance, min assist with LEs modified tandem.   Practiced short term memory + functional task. Pt needing max to total verbal cues to find wheelchair after ambulating down straight hallway ~100' away, turning around and trying to find wheelchair again. Pt having difficulty seeing the wheelchair then called it by a different name therefore no long thought it was a wheelchair anymore.  Dynamic balance hitting large ball with oversized bat then ambulating 60' around cone "base." Min assist overall, pt able to remember task and remember to "run base" with each hit without verbal cues. Some difficulty fully circling cone.   Ambulation to room with wife providing min assist (PT on other side of pt for safety), educated pt wife on telling pt to "stop" if he has loss of balance, is distracted, or environment becomes too busy and allow pt time to refocus. Pt continues with significant difficulty acknowledging and correcting language errors.   Therapy Documentation Precautions:  Precautions Precautions: Fall Precaution Comments: Lt crani, skull cap removed and in abdomen Required Braces or Orthoses: Other Brace/Splint Other Brace/Splint: Helmet  Restrictions Weight Bearing Restrictions: No Pain: Pain Assessment Pain Assessment: No/denies pain  See FIM for  current functional status  Therapy/Group: Individual Therapy  [name] 01/31/2013, 11:58 AM

## 2013-01-31 NOTE — Progress Notes (Signed)
Patient ID: KARIS EMIG, male   DOB: [birth year], 37 y.o.   MRN: [id number] Subjective/Complaints: No complaints. No apparent problems yesterday.  A 12 point review of systems has been performed and if not noted above is otherwise negative.     Objective: Vital Signs: Blood pressure 119/74, pulse 66, temperature 98.1 F (36.7 C), temperature source Oral, resp. rate 18, height 6' (1.829 m), weight 81.5 kg (179 lb 10.8 oz), SpO2 100.00%. No results found. No results found for this basename: WBC, HGB, HCT, PLT,  in the last 72 hours No results found for this basename: NA, K, CL, CO, GLUCOSE, BUN, CREATININE, CALCIUM,  in the last 72 hours CBG (last 3)  No results found for this basename: GLUCAP,  in the last 72 hours  Wt Readings from Last 3 Encounters:  01/29/13 81.5 kg (179 lb 10.8 oz)  01/03/13 83.3 kg (183 lb 10.3 oz)  01/03/13 83.3 kg (183 lb 10.3 oz)    Physical Exam:  Eyes:  Pupils reactive to light  Neck: Neck supple. No thyromegaly present.  Cardiovascular: Normal rate and regular rhythm.  Pulmonary/Chest: Effort normal and breath sounds normal. No respiratory distress.  Abdominal: Soft. Bowel sounds are normal. He exhibits no distension.  Neurological:     RUE tone still is trace to 1. RUE is grossly 4/5. RLE is 4 prox to 2-3/5 APF and 0/tr ADF Senses pain on right. DTR's 3+. Answers simple questions. Limited insight and awareness. Confabulates. Remembered my name. Appears have visual field deficits, but inconsistent on exam. I'm in [place]. Can't tell me where. Recalls wife's name Skin:  Craniotomy site and donor sites intact--staples all are out.   Assessment/Plan: 1. Functional deficits secondary to severe left SDH, SAH s/p craniectomy,  which require 3+ hours per day of interdisciplinary therapy in a comprehensive inpatient rehab setting. Physiatrist is providing close team supervision and 24 hour management of active medical problems listed below. Physiatrist and rehab  team continue to assess barriers to discharge/monitor patient progress toward functional and medical goals.  Helmet when OOB Will need formal neuro-optho eval after dc. RLAS V-VI currently.   FIM: FIM - Bathing Bathing Steps Patient Completed: Chest;Right Arm;Left Arm;Abdomen;Front perineal area;Right upper leg;Left upper leg;Buttocks Bathing: 4: Min-Patient completes 8-9 28f 10 parts or 75+ percent  FIM - Upper Body Dressing/Undressing Upper body dressing/undressing steps patient completed: Thread/unthread right sleeve of pullover shirt/dresss;Thread/unthread left sleeve of pullover shirt/dress;Put head through opening of pull over shirt/dress;Pull shirt over trunk Upper body dressing/undressing: 5: Supervision: Safety issues/verbal cues FIM - Lower Body Dressing/Undressing Lower body dressing/undressing steps patient completed: Thread/unthread right underwear leg;Thread/unthread left underwear leg;Pull underwear up/down;Thread/unthread right pants leg;Thread/unthread left pants leg;Pull pants up/down;Don/Doff right sock;Don/Doff left sock Lower body dressing/undressing: 4: Min-Patient completed 75 plus % of tasks  FIM - Toileting Toileting steps completed by patient: Adjust clothing prior to toileting;Performs perineal hygiene;Adjust clothing after toileting Toileting Assistive Devices: Grab bar or rail for support Toileting: 5: Supervision: Safety issues/verbal cues  FIM - Diplomatic Services operational officer Devices: Grab bars Toilet Transfers: 4-To toilet/BSC: Min A (steadying Pt. > 75%);4-From toilet/BSC: Min A (steadying Pt. > 75%)  FIM - Bed/Chair Transfer Bed/Chair Transfer Assistive Devices: Bed rails Bed/Chair Transfer: 4: Chair or W/C > Bed: Min A (steadying Pt. > 75%);4: Bed > Chair or W/C: Min A (steadying Pt. > 75%)  FIM - Locomotion: Wheelchair Locomotion: Wheelchair: 1: Total Assistance/staff pushes wheelchair (Pt<25%) FIM - Locomotion: Ambulation Locomotion:  Ambulation Assistive Devices: Other (comment) (  Bilateral HHA) Ambulation/Gait Assistance: 1: +2 Total assist Locomotion: Ambulation: 3: Travels 150 ft or more with moderate assistance (Pt: 50 - 74%)  Comprehension Comprehension Mode: Auditory Comprehension: 4-Understands basic 75 - 89% of the time/requires cueing 10 - 24% of the time  Expression Expression Mode: Verbal Expression Assistive Devices: 6-Other (Comment) Expression: 3-Expresses basic 50 - 74% of the time/requires cueing 25 - 50% of the time. Needs to repeat parts of sentences.  Social Interaction Social Interaction Mode: Asleep Social Interaction: 5-Interacts appropriately 90% of the time - Needs monitoring or encouragement for participation or interaction.  Problem Solving Problem Solving: 4-Solves basic 75 - 89% of the time/requires cueing 10 - 24% of the time  Memory Memory: 2-Recognizes or recalls 25 - 49% of the time/requires cueing 51 - 75% of the time  Medical Problem List and Plan:  1. 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 Status post craniotomy evacuation hematoma with insertion of bone flap abdominal left upper quadrant 12/15/2012 with revision after midline shift 12/28/2012 ---RLAS V/VI at this point 2. DVT Prophylaxis/Anticoagulation: SCDs. Dopplers negative 3. Numerous facial fractures. Conservative care per ENT  4. Mood: Ativan as needed, Inderal wean to 20mg  q8 -sleep improved. -ritalin- continue for attention -enclosure bed still required 5. Neuropsych: This patient is not capable of making decisions on his own behalf.  6. Seizure disorder. keppra and dilantin on board. Wean as outpt    7. Dysphagia. Gastrostomy PEG tube/ 12/31/2012 per trauma services.  -trach stoma healed  -diet D3  -off TF and H2O flushes except for maintenance---dc G tube next week prior to dc. 8. Alcohol abuse. Counseling  9. Wound care: local care to PEG, crani, and abdominal sites.   -local care-- -helmet   10. Temp: afebrile, stable leukocytosis -urine culture and cxr negative, dopplers negative. 11. Spasticity:   low dose baclofen has helped  -continued therapy, engagement of right side 12. Urinary frequency  -timed voids, bladder education  -working on voiding patterns.     LOS (Days) 28 A FACE TO FACE EVALUATION WAS PERFORMED  SWARTZ,ZACHARY T 01/31/2013 8:07 AM

## 2013-02-01 ENCOUNTER — Inpatient Hospital Stay (HOSPITAL_COMMUNITY): Payer: BC Managed Care – PPO | Admitting: Physical Therapy

## 2013-02-01 NOTE — Progress Notes (Signed)
Physical Therapy Session Note  Patient Details  Name: Albert [name] MRN: [id number] Date of Birth: Jul 03, 1976  Today's Date: 02/01/2013 Time: 0900-1000 Time Calculation (min): 60 min   Therapy Documentation Precautions:  Precautions Precautions: Fall Precaution Comments: Lt crani, skull cap removed and in abdomen Required Braces or Orthoses: Other Brace/Splint Other Brace/Splint: Helmet  Restrictions Weight Bearing Restrictions: No Pain: denies pain  Therapeutic Activity:(15') Transfer training sit<->stand and stand-pivot transfers with S/min-A Therapeutic Exercise:(30') Nu-Step x 15' Level 5.  B LE's in sitting with manual resistance  Gait Training:(15') using RW with min-A 3 x 150'.  R LE ataxia and R side inattention needing verbal and tactile cues especially with turning to the right     Therapy/Group: Individual Therapy  Jeannelle Wiens J 02/01/2013, 9:20 AM

## 2013-02-01 NOTE — Progress Notes (Signed)
Patient ID: DIEM PAGNOTTA, male   DOB: 08/03/1976, 37 y.o.   MRN: [id number] Patient ID: KYAL ARTS, male   DOB: [birth year], 37 y.o.   MRN: [id number] Subjective/Complaints: 8/64.  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 Continues to do well without concerns or complaints alert and appropriate.  HEENT negative; Chest clear; cardiovascular normal heart sounds no tachycardia; abdomen soft flat nontender. No distention; extremities -no edema left ankle out of brace. impression stable status post her medical brain injury     Objective: Vital Signs: Blood pressure 107/66, pulse 73, temperature 98.1 F (36.7 C), temperature source Oral, resp. rate 18, height 6' (1.829 m), weight 81.5 kg (179 lb 10.8 oz), SpO2 99.00%. No results found. No results found for this basename: WBC, HGB, HCT, PLT,  in the last 72 hours No results found for this basename: NA, K, CL, CO, GLUCOSE, BUN, CREATININE, CALCIUM,  in the last 72 hours CBG (last 3)  No results found for this basename: GLUCAP,  in the last 72 hours  Wt Readings from Last 3 Encounters:  01/29/13 81.5 kg (179 lb 10.8 oz)  01/03/13 83.3 kg (183 lb 10.3 oz)  01/03/13 83.3 kg (183 lb 10.3 oz)    Physical Exam:  Eyes:  Pupils reactive to light  Neck: Neck supple. No thyromegaly present.  Cardiovascular: Normal rate and regular rhythm.  Pulmonary/Chest: Effort normal and breath sounds normal. No respiratory distress.  Abdominal: Soft. Bowel sounds are normal. He exhibits no distension.  Neurological:     RUE tone still is trace to 1. RUE is grossly 4/5. RLE is 4 prox to 2-3/5 APF and 0/tr ADF Senses pain on right. DTR's 3+. Answers simple questions. Limited insight and awareness. Confabulates. Remembered my name. Appears have visual field deficits, but inconsistent on exam. I'm in [place]. Can't tell me where. Recalls wife's name Skin:  Craniotomy site and donor sites intact--staples all are out.   Assessment/Plan: 1. Functional  deficits secondary to severe left SDH, SAH s/p craniectomy,  which require 3+ hours per day of interdisciplinary therapy in a comprehensive inpatient rehab setting. Physiatrist is providing close team supervision and 24 hour management of active medical problems listed below. Physiatrist and rehab team continue to assess barriers to discharge/monitor patient progress toward functional and medical goals.  Helmet when OOB Will need formal neuro-optho eval after dc. RLAS V-VI currently.   FIM: FIM - Bathing Bathing Steps Patient Completed: Chest;Right Arm;Left Arm;Abdomen;Front perineal area;Right upper leg;Left upper leg;Buttocks Bathing: 4: Min-Patient completes 8-9 57f 10 parts or 75+ percent  FIM - Upper Body Dressing/Undressing Upper body dressing/undressing steps patient completed: Thread/unthread right sleeve of pullover shirt/dresss;Thread/unthread left sleeve of pullover shirt/dress;Put head through opening of pull over shirt/dress;Pull shirt over trunk Upper body dressing/undressing: 5: Supervision: Safety issues/verbal cues FIM - Lower Body Dressing/Undressing Lower body dressing/undressing steps patient completed: Thread/unthread right underwear leg;Thread/unthread left underwear leg;Pull underwear up/down;Thread/unthread right pants leg;Thread/unthread left pants leg;Pull pants up/down;Don/Doff left shoe Lower body dressing/undressing: 3: Mod-Patient completed 50-74% of tasks  FIM - Toileting Toileting steps completed by patient: Adjust clothing prior to toileting;Performs perineal hygiene;Adjust clothing after toileting Toileting Assistive Devices: Grab bar or rail for support Toileting: 5: Supervision: Safety issues/verbal cues  FIM - Diplomatic Services operational officer Devices: Grab bars Toilet Transfers: 4-To toilet/BSC: Min A (steadying Pt. > 75%);4-From toilet/BSC: Min A (steadying Pt. > 75%)  FIM - Bed/Chair Transfer Bed/Chair Transfer Assistive Devices: Bed  rails Bed/Chair Transfer: 4: Bed > Chair or W/C:  Min A (steadying Pt. > 75%);4: Chair or W/C > Bed: Min A (steadying Pt. > 75%)  FIM - Locomotion: Wheelchair Locomotion: Wheelchair: 1: Total Assistance/staff pushes wheelchair (Pt<25%) FIM - Locomotion: Ambulation Locomotion: Ambulation Assistive Devices: Other (comment) (Bilateral HHA) Ambulation/Gait Assistance: 1: +2 Total assist Locomotion: Ambulation: 3: Travels 150 ft or more with moderate assistance (Pt: 50 - 74%)  Comprehension Comprehension Mode: Auditory Comprehension: 5-Understands basic 90% of the time/requires cueing < 10% of the time  Expression Expression Mode: Verbal Expression Assistive Devices: 6-Other (Comment) Expression: 3-Expresses basic 50 - 74% of the time/requires cueing 25 - 50% of the time. Needs to repeat parts of sentences.  Social Interaction Social Interaction Mode: Asleep Social Interaction: 5-Interacts appropriately 90% of the time - Needs monitoring or encouragement for participation or interaction.  Problem Solving Problem Solving: 4-Solves basic 75 - 89% of the time/requires cueing 10 - 24% of the time  Memory Memory: 2-Recognizes or recalls 25 - 49% of the time/requires cueing 51 - 75% of the time  Medical Problem List and Plan:  1. 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 Status post craniotomy evacuation hematoma with insertion of bone flap abdominal left upper quadrant 12/15/2012 with revision after midline shift 12/28/2012 ---RLAS V/VI at this point 2. DVT Prophylaxis/Anticoagulation: SCDs. Dopplers negative 3. Numerous facial fractures. Conservative care per ENT  4. Mood: Ativan as needed, Inderal wean to 20mg  q8 -sleep improved. -ritalin- continue for attention -enclosure bed still required 5. Neuropsych: This patient is not capable of making decisions on his own behalf.  6. Seizure disorder. keppra and dilantin on board. Wean as outpt    7.  Dysphagia. Gastrostomy PEG tube/ 12/31/2012 per trauma services.  -trach stoma healed  -diet D3  -off TF and H2O flushes except for maintenance---dc G tube next week prior to dc. 8. Alcohol abuse. Counseling  9. Wound care: local care to PEG, crani, and abdominal sites.  -local care-- -helmet   10. Temp: afebrile, stable leukocytosis -urine culture and cxr negative, dopplers negative. 11. Spasticity:   low dose baclofen has helped  -continued therapy, engagement of right side 12. Urinary frequency  -timed voids, bladder education  -working on voiding patterns.     LOS (Days) 29 A FACE TO FACE EVALUATION WAS PERFORMED  Rogelia Boga 02/01/2013 7:53 AM

## 2013-02-01 NOTE — Progress Notes (Signed)
Patient had an uneventful night. Appeared to be sleeping well throughout this shift. Toileted patient prior to getting back to bed around 2030. Continue plan of care.

## 2013-02-02 ENCOUNTER — Inpatient Hospital Stay (HOSPITAL_COMMUNITY): Payer: BC Managed Care – PPO | Admitting: *Deleted

## 2013-02-02 DIAGNOSIS — IMO0001 Reserved for inherently not codable concepts without codable children: Secondary | ICD-10-CM

## 2013-02-02 DIAGNOSIS — S069X9S Unspecified intracranial injury with loss of consciousness of unspecified duration, sequela: Secondary | ICD-10-CM

## 2013-02-02 NOTE — Progress Notes (Signed)
Patient ID: ANUEL SITTER, male   DOB: 1976/02/14, 37 y.o.   MRN: [id number] Patient ID: VEARL ALLBAUGH, male   DOB: Mar 14, 1976, 37 y.o.   MRN: [id number] Patient ID: COBE VINEY, male   DOB: May 30, 1976, 37 y.o.   MRN: [id number] Subjective/Complaints:  4/30.   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 Continues to do well without concerns or complaints alert and appropriate.  HEENT negative; Chest clear; cardiovascular normal heart sounds;  no tachycardia; abdomen soft flat nontender. No distention; extremities -no edema ; left ankle out of brace.   Impression stable status post traumatic brain injury  Status post multiple trauma History pneumonia stable  Plan- continue Dilantin for seizure prophylaxis     Objective: Vital Signs: Blood pressure 120/81, pulse 62, temperature 98.2 F (36.8 C), temperature source Oral, resp. rate 18, height 6' (1.829 m), weight 81.5 kg (179 lb 10.8 oz), SpO2 99.00%. No results found. No results found for this basename: WBC, HGB, HCT, PLT,  in the last 72 hours No results found for this basename: NA, K, CL, CO, GLUCOSE, BUN, CREATININE, CALCIUM,  in the last 72 hours CBG (last 3)  No results found for this basename: GLUCAP,  in the last 72 hours  Wt Readings from Last 3 Encounters:  01/29/13 81.5 kg (179 lb 10.8 oz)  01/03/13 83.3 kg (183 lb 10.3 oz)  01/03/13 83.3 kg (183 lb 10.3 oz)    Physical Exam:  Eyes:  Pupils reactive to light  Neck: Neck supple. No thyromegaly present.  Cardiovascular: Normal rate and regular rhythm.  Pulmonary/Chest: Effort normal and breath sounds normal. No respiratory distress.  Abdominal: Soft. Bowel sounds are normal. He exhibits no distension.  Neurological:     RUE tone still is trace to 1. RUE is grossly 4/5. RLE is 4 prox to 2-3/5 APF and 0/tr ADF Senses pain on right. DTR's 3+. Answers simple questions. Limited insight and awareness. Confabulates. Remembered my name. Appears have visual field deficits,  but inconsistent on exam. I'm in [place]. Can't tell me where. Recalls wife's name Skin:  Craniotomy site and donor sites intact--staples all are out.   Assessment/Plan: 1. Functional deficits secondary to severe left SDH, SAH s/p craniectomy,  which require 3+ hours per day of interdisciplinary therapy in a comprehensive inpatient rehab setting. Physiatrist is providing close team supervision and 24 hour management of active medical problems listed below. Physiatrist and rehab team continue to assess barriers to discharge/monitor patient progress toward functional and medical goals.  Helmet when OOB Will need formal neuro-optho eval after dc. RLAS V-VI currently.   FIM: FIM - Bathing Bathing Steps Patient Completed: Chest;Right Arm;Left Arm;Abdomen;Front perineal area;Right upper leg;Left upper leg;Buttocks Bathing: 4: Min-Patient completes 8-9 68f 10 parts or 75+ percent  FIM - Upper Body Dressing/Undressing Upper body dressing/undressing steps patient completed: Thread/unthread right sleeve of pullover shirt/dresss;Thread/unthread left sleeve of pullover shirt/dress;Put head through opening of pull over shirt/dress;Pull shirt over trunk Upper body dressing/undressing: 5: Supervision: Safety issues/verbal cues FIM - Lower Body Dressing/Undressing Lower body dressing/undressing steps patient completed: Thread/unthread right underwear leg;Thread/unthread left underwear leg;Pull underwear up/down;Thread/unthread right pants leg;Thread/unthread left pants leg;Pull pants up/down;Don/Doff left shoe Lower body dressing/undressing: 3: Mod-Patient completed 50-74% of tasks  FIM - Toileting Toileting steps completed by patient: Adjust clothing prior to toileting;Performs perineal hygiene;Adjust clothing after toileting Toileting Assistive Devices: Grab bar or rail for support Toileting: 4: Steadying assist  FIM - Diplomatic Services operational officer Devices: Grab bars Toilet Transfers: 4-To  toilet/BSC: Min A (steadying Pt. > 75%);4-From toilet/BSC: Min A (steadying Pt. > 75%)  FIM - Bed/Chair Transfer Bed/Chair Transfer Assistive Devices: Bed rails Bed/Chair Transfer: 4: Sit > Supine: Min A (steadying pt. > 75%/lift 1 leg);4: Bed > Chair or W/C: Min A (steadying Pt. > 75%);4: Chair or W/C > Bed: Min A (steadying Pt. > 75%)  FIM - Locomotion: Wheelchair Locomotion: Wheelchair: 1: Total Assistance/staff pushes wheelchair (Pt<25%) FIM - Locomotion: Ambulation Locomotion: Ambulation Assistive Devices: Other (comment) (Bilateral HHA) Ambulation/Gait Assistance: 1: +2 Total assist Locomotion: Ambulation: 3: Travels 150 ft or more with moderate assistance (Pt: 50 - 74%)  Comprehension Comprehension Mode: Auditory Comprehension: 5-Understands complex 90% of the time/Cues < 10% of the time  Expression Expression Mode: Verbal Expression Assistive Devices: 6-Other (Comment) Expression: 3-Expresses basic 50 - 74% of the time/requires cueing 25 - 50% of the time. Needs to repeat parts of sentences.  Social Interaction Social Interaction Mode: Asleep Social Interaction: 6-Interacts appropriately with others with medication or extra time (anti-anxiety, antidepressant).  Problem Solving Problem Solving: 3-Solves basic 50 - 74% of the time/requires cueing 25 - 49% of the time  Memory Memory: 2-Recognizes or recalls 25 - 49% of the time/requires cueing 51 - 75% of the time  Medical Problem List and Plan:  1. 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 Status post craniotomy evacuation hematoma with insertion of bone flap abdominal left upper quadrant 12/15/2012 with revision after midline shift 12/28/2012 ---RLAS V/VI at this point 2. DVT Prophylaxis/Anticoagulation: SCDs. Dopplers negative 3. Numerous facial fractures. Conservative care per ENT  4. Mood: Ativan as needed, Inderal wean to 20mg  q8 -sleep improved. -ritalin- continue for  attention -enclosure bed still required 5. Neuropsych: This patient is not capable of making decisions on his own behalf.  6. Seizure disorder. keppra and dilantin on board. Wean as outpt    7. Dysphagia. Gastrostomy PEG tube/ 12/31/2012 per trauma services.  -trach stoma healed  -diet D3  -off TF and H2O flushes except for maintenance---dc G tube next week prior to dc. 8. Alcohol abuse. Counseling  9. Wound care: local care to PEG, crani, and abdominal sites.  -local care-- -helmet   10. Temp: afebrile, stable leukocytosis -urine culture and cxr negative, dopplers negative. 11. Spasticity:   low dose baclofen has helped  -continued therapy, engagement of right side 12. Urinary frequency  -timed voids, bladder education  -working on voiding patterns.     LOS (Days) 30 A FACE TO FACE EVALUATION WAS PERFORMED  Rogelia Boga 02/02/2013 7:35 AM

## 2013-02-02 NOTE — Progress Notes (Signed)
Occupational Therapy Note  Patient Details  Name: Albert [name] MRN: [id number] Date of Birth: [birth year] Today's Date: 02/02/2013  Time:  1000-1130  (90 min) Individual session Pain:  None  Engaged in therapeutic bathind and dressiing at shower level.  Addressed balance, transfers, functional mobility , problem solving, memory, RUE NMRE.   Pt. Transferred from wc to shower with min assist.  Stood with supervision with legs pressed up against the shower.    Pt. Maintained sustained attention to wash hair for 15 seconds.  Ptplaced in wc with safety belt  On.  Pt refused to put on binder or helmet. Humberto Seals 02/02/2013, 11:32 AM

## 2013-02-03 ENCOUNTER — Inpatient Hospital Stay (HOSPITAL_COMMUNITY): Payer: BC Managed Care – PPO

## 2013-02-03 ENCOUNTER — Inpatient Hospital Stay (HOSPITAL_COMMUNITY): Payer: BC Managed Care – PPO | Admitting: Speech Pathology

## 2013-02-03 ENCOUNTER — Encounter (HOSPITAL_COMMUNITY): Payer: BC Managed Care – PPO | Admitting: Occupational Therapy

## 2013-02-03 ENCOUNTER — Inpatient Hospital Stay (HOSPITAL_COMMUNITY): Payer: BC Managed Care – PPO | Admitting: Physical Therapy

## 2013-02-03 MED ORDER — PROPRANOLOL HCL 20 MG PO TABS
20.0000 mg | ORAL_TABLET | Freq: Three times a day (TID) | ORAL | Status: DC
  Administered 2013-02-03 – 2013-02-05 (×9): 20 mg via ORAL
  Filled 2013-02-03 (×14): qty 1

## 2013-02-03 NOTE — Progress Notes (Addendum)
Patient ID: Albert [name], male   DOB: 12/29/1975, 37 y.o.   MRN: [id number] Subjective/Complaints: Slept well. No new issues. Sometimes refuses helmet A 12 point review of systems has been performed and if not noted above is otherwise negative.     Objective: Vital Signs: Blood pressure 99/61, pulse 63, temperature 97.9 F (36.6 C), temperature source Oral, resp. rate 17, height 6' (1.829 m), weight 81.5 kg (179 lb 10.8 oz), SpO2 98.00%. No results found. No results found for this basename: WBC, HGB, HCT, PLT,  in the last 72 hours No results found for this basename: NA, K, CL, CO, GLUCOSE, BUN, CREATININE, CALCIUM,  in the last 72 hours CBG (last 3)  No results found for this basename: GLUCAP,  in the last 72 hours  Wt Readings from Last 3 Encounters:  01/29/13 81.5 kg (179 lb 10.8 oz)  01/03/13 83.3 kg (183 lb 10.3 oz)  01/03/13 83.3 kg (183 lb 10.3 oz)    Physical Exam:  Eyes:  Pupils reactive to light  Neck: Neck supple. No thyromegaly present.  Cardiovascular: Normal rate and regular rhythm.  Pulmonary/Chest: Effort normal and breath sounds normal. No respiratory distress.  Abdominal: Soft. Bowel sounds are normal. He exhibits no distension.  Neurological:     RUE tone still is trace to 1. RUE is grossly 4/5. RLE is 4 prox to 2-3/5 APF and 0/tr ADF Senses pain on right. DTR's 3+. Answers simple questions. Limited insight and awareness. Confabulates. Remembered my name. Appears have visual field deficits, but inconsistent on exam. I'm in [place]. Can't tell me where. Recalls wife's name Skin:  Craniotomy site and donor sites intact--staples all are out.   Assessment/Plan: 1. Functional deficits secondary to severe left SDH, SAH s/p craniectomy,  which require 3+ hours per day of interdisciplinary therapy in a comprehensive inpatient rehab setting. Physiatrist is providing close team supervision and 24 hour management of active medical problems listed below. Physiatrist and rehab  team continue to assess barriers to discharge/monitor patient progress toward functional and medical goals.  Helmet when OOB Will need formal neuro-optho eval after dc. RLAS V-VI currently.   FIM: FIM - Bathing Bathing Steps Patient Completed: Chest;Right Arm;Left Arm;Abdomen;Front perineal area;Right upper leg;Left upper leg;Buttocks Bathing: 4: Min-Patient completes 8-9 31f 10 parts or 75+ percent  FIM - Upper Body Dressing/Undressing Upper body dressing/undressing steps patient completed: Thread/unthread right sleeve of pullover shirt/dresss;Thread/unthread left sleeve of pullover shirt/dress;Put head through opening of pull over shirt/dress;Pull shirt over trunk Upper body dressing/undressing: 5: Supervision: Safety issues/verbal cues FIM - Lower Body Dressing/Undressing Lower body dressing/undressing steps patient completed: Thread/unthread right underwear leg;Thread/unthread left underwear leg;Pull underwear up/down;Thread/unthread right pants leg;Thread/unthread left pants leg;Pull pants up/down;Don/Doff left shoe Lower body dressing/undressing: 4: Min-Patient completed 75 plus % of tasks  FIM - Toileting Toileting steps completed by patient: Adjust clothing prior to toileting;Performs perineal hygiene;Adjust clothing after toileting Toileting Assistive Devices: Grab bar or rail for support Toileting: 4: Steadying assist  FIM - Diplomatic Services operational officer Devices: Grab bars Toilet Transfers: 4-To toilet/BSC: Min A (steadying Pt. > 75%)  FIM - Bed/Chair Transfer Bed/Chair Transfer Assistive Devices: Bed rails Bed/Chair Transfer: 4: Bed > Chair or W/C: Min A (steadying Pt. > 75%);4: Chair or W/C > Bed: Min A (steadying Pt. > 75%)  FIM - Locomotion: Wheelchair Locomotion: Wheelchair: 1: Total Assistance/staff pushes wheelchair (Pt<25%) FIM - Locomotion: Ambulation Locomotion: Ambulation Assistive Devices: Other (comment) (Bilateral HHA) Ambulation/Gait Assistance:  1: +2 Total assist Locomotion: Ambulation:  3: Travels 150 ft or more with moderate assistance (Pt: 50 - 74%)  Comprehension Comprehension Mode: Auditory Comprehension: 3-Understands basic 50 - 74% of the time/requires cueing 25 - 50%  of the time  Expression Expression Mode: Verbal Expression Assistive Devices: 6-Other (Comment) Expression: 2-Expresses basic 25 - 49% of the time/requires cueing 50 - 75% of the time. Uses single words/gestures.  Social Interaction Social Interaction Mode: Asleep Social Interaction: 3-Interacts appropriately 50 - 74% of the time - May be physically or verbally inappropriate.  Problem Solving Problem Solving: 1-Solves basic less than 25% of the time - needs direction nearly all the time or does not effectively solve problems and may need a restraint for safety  Memory Memory: 1-Recognizes or recalls less than 25% of the time/requires cueing greater than 75% of the time  Medical Problem List and Plan:  1. 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 Status post craniotomy evacuation hematoma with insertion of bone flap abdominal left upper quadrant 12/15/2012 with revision after midline shift 12/28/2012 ---RLAS V/VI at this point 2. DVT Prophylaxis/Anticoagulation: SCDs. Dopplers negative 3. Numerous facial fractures. Conservative care per ENT  4. Mood: Ativan as needed, Inderal wean to 20mg  q8 (didn't do Friday) -sleep improved. -ritalin- continue for attention -enclosure bed still required 5. Neuropsych: This patient is not capable of making decisions on his own behalf.  6. Seizure disorder. keppra and dilantin on board. Wean as outpt    7. Dysphagia. Gastrostomy PEG tube/ 12/31/2012 per trauma services.  -trach stoma healed  -diet D3  -off TF and H2O flushes except for maintenance---dc G tube this week prior to dc. 8. Alcohol abuse. Counseling  9. Wound care: local care to PEG, crani, and abdominal sites.   -local care-- -helmet   10. Temp: afebrile, stable leukocytosis -urine culture and cxr negative, dopplers negative. 11. Spasticity:   low dose baclofen has helped  -continued therapy, engagement of right side 12. Urinary frequency  -timed voids, bladder education  -working on voiding patterns.     LOS (Days) 31 A FACE TO FACE EVALUATION WAS PERFORMED  Sigmond Patalano T 02/03/2013 6:26 AM

## 2013-02-03 NOTE — Progress Notes (Signed)
Speech Language Pathology Daily Session Note  Patient Details  Name: Albert [name] MRN: [id number] Date of Birth: Jan 31, 1976  Today's Date: 02/03/2013 Time: 1405-1450 Time Calculation (min): 45 min  Short Term Goals: Week 4: SLP Short Term Goal 1 (Week 4): Patient will demonstrate selective attention to task for 15 minutes with Min assist verbal and tactile cues.   SLP Short Term Goal 2 (Week 4): Patient will consume Regular textures and thin liquids via straw with no overt s/s of aspiration and Supervision verbal cues. SLP Short Term Goal 3 (Week 4): Patient will solve basic, familiar problems with Min assist multi-modal clinician cues. SLP Short Term Goal 4 (Week 4): Patient will request help as needed during daily tasks with Min assist question cues. SLP Short Term Goal 5 (Week 4): Patient will self-monitor and correct verbal errors with Max assist clinician cues. SLP Short Term Goal 6 (Week 4): Patient will utilize working memory strategies with Max assist multi-modal cues.  Skilled Therapeutic Interventions: Skilled treatment session focused on addressing cognitive-linguistic goals.  SLP facilitated session with alternating attention new learning task with Mod-Max assist verbal cues to recall and follow procedures of task.  SLP also facilitated session by providing patient with photos and requesting he describe what people were doing; patient accurately completed sentences in 50% of trials.  Patient reported feeling fatigued this afternoon.  Wife present for session and SLP educated regarding cognitive-linguistic strengths and weakness as well as impacting factors such as fatigue, attention and environmental factors.  Continue with current plan of care.   FIM:  Comprehension Comprehension Mode: Auditory Comprehension: 4-Understands basic 75 - 89% of the time/requires cueing 10 - 24% of the time Expression Expression Mode: Verbal Expression: 3-Expresses basic 50 - 74% of the  time/requires cueing 25 - 50% of the time. Needs to repeat parts of sentences. Social Interaction Social Interaction: 4-Interacts appropriately 75 - 89% of the time - Needs redirection for appropriate language or to initiate interaction. Problem Solving Problem Solving: 2-Solves basic 25 - 49% of the time - needs direction more than half the time to initiate, plan or complete simple activities Memory Memory: 2-Recognizes or recalls 25 - 49% of the time/requires cueing 51 - 75% of the time FIM - Eating Eating Activity: 5: Needs verbal cues/supervision  Pain Pain Assessment Pain Assessment: No/denies pain  Therapy/Group: Individual Therapy  Charlane Ferretti., CCC-SLP [phone]  Margarete Horace 02/03/2013, 4:13 PM

## 2013-02-03 NOTE — Progress Notes (Signed)
Physical Therapy Session Note  Patient Details  Name: Albert [name] MRN: [id number] Date of Birth: 12-06-1975  Today's Date: 02/03/2013 Time: 0900-0950 Time Calculation (min): 50 min  Skilled Therapeutic Interventions/Progress Updates:   Session focused on functional ambulation in controlled and home environments (min A overall and cues for attention to R), w/c propulsion using LLE, therapeutic activity with emphasis on balance/vision/naming items in kitchen, and overall awareness of injury. Pt asking a lot of questions today about specifics on how long he has been here, the date, what happened, etc and reoriented patient as able. Pt with little carryover of information but appears to have some increased awareness to situation becoming somewhat emotional at end of session when having discussion with pt's co-worker (therefore, ended session 10 min early). Left pt with co-worker with safety belt on in w/c to visit.  Therapy Documentation Precautions:  Precautions Precautions: Fall Precaution Comments: Lt crani, skull cap removed and in abdomen Required Braces or Orthoses: Other Brace/Splint Other Brace/Splint: Helmet  Restrictions Weight Bearing Restrictions: No General: Amount of Missed PT Time (min): 10 Minutes  Pain:  No complaints.   See FIM for current functional status  Therapy/Group: Individual Therapy  Karolee Stamps [facility] 02/03/2013, 9:56 AM

## 2013-02-03 NOTE — Progress Notes (Signed)
Occupational Therapy Note  Patient Details  Name: Albert [name] MRN: [id number] Date of Birth: 09-02-1976 Today's Date: 02/03/2013  Time: 1045-1130 Pt denies pain Individual Therapy  Pt seated in w/c with wife present.  Pt ambulated with HHA/min A from wife to room to practice shower transfers stepping over ledge to simulate home environment.  Pt and wife practiced transfer X 3 without using grab bars but placing hand on wall for balance when stepping into shower.  Recommended to wife that she have grab bars installed at home.  Wife asked when would be the best time for pt to shower at home.  Wife stated that she was thinking about assisting patient and have him lay in bed while she bathed her daughter.  She would not have line of sight from bathroom.      Jaycen, [place] [facility] 02/03/2013, 11:42 AM

## 2013-02-03 NOTE — Progress Notes (Signed)
Physical Therapy Note  Patient Details  Name: BRAXTYN BOJARSKI MRN: [id number] Date of Birth: Jan 14, 1976 Today's Date: 02/03/2013  1530-1625 (55 minutes) individual Pain: no reported pain Focus of treatment: Gait training focusing on attention to right; therapeutic exercise focused on RT LE terminal knee extension control in stance Treatment: Gait on unit (200 feet) min assist for balance with intermittent stagger to right; Kinetron in standing with UE support 3 x 15 reps with tactile cues for terminal knee extension on right; partial wall squats 2  X 15; gait min assist 200 feet with vcs to attend to objects on right.    Aditi Rovira,JIM 02/03/2013, 4:23 PM

## 2013-02-03 NOTE — Progress Notes (Signed)
Recreational Therapy Session Note  Patient Details  Name: BOBY EYER MRN: [id number] Date of Birth: 1976-07-20 Today's Date: 02/03/2013 Time:915-950 Pain: no c/o Skilled Therapeutic Interventions/Progress Updates:Session focused on ambulation, orientation & awareness. Pt ambulated on unit with Min assist, min cues for decreased vision.  Pt demonstrating an increased awareness of deficits asking questions pertaining to how he acquired his injury, how long has he been in hospital, why he can't remember things, also stating this his leg and hand aren't strong.  Pt became emotional at end of session when he couldn't remember where he worked during a conversation with a co-worker who came to visit.  Reoriented pt to situation & place and left in company of his co-worker with seat belt in place.  Therapy/Group: Co-Treatment   Janiyha Montufar 02/03/2013, 12:38 PM

## 2013-02-03 NOTE — Progress Notes (Signed)
Occupational Therapy Daily Note and Weekly Progress Note  Patient Details  Name: Albert [name] MRN: [id number] Date of Birth: [birth year]  Today's Date: 02/03/2013 Time: 0730-0830 Time Calculation (min): 60 min  1:1 self care retraining at shower level with focus on functional ambulation around room, orientation information, recall of daily tasks and familiarity of staff, recall of brain injury (intermittly), standing balance with out UE support, sequencing, visual scanning and identification of mistakes due to visual deficits, discussing home and family life (at pt's choice). Pt demonstrate short recall of conversation    Patient has met 4 of 4 short term goals.  [name] has continued to make good gains towards LTG and d/c to home environment with wife. Pt is overall min A for bathing, dressing , grooming, toileting and functional ambulation, and supervision for transfers. PT continues to demonstrate expressive difficulties with decreased word finding and makes up words in conversation. Pt does demonstrate an inconsistent intellectual awareness of his deficits due to his head injury. On going family education has been going on with wife and his parents. An outing with his wife and daughter is planned for this Wednesday. Therapy team is putting together a written home program for him. Pt's vision has functionally gotten better with routine of familiar tasks but continues to be diminished in right field.    Patient continues to demonstrate the following deficits: muscle weakness and muscle joint tightness, impaired timing and sequencing, abnormal tone, decreased coordination and decreased motor planning, decreased visual perceptual skills and decreased visual motor skills, decreased attention to right, decreased attention, decreased awareness, decreased problem solving, decreased safety awareness, decreased memory and delayed processing and decreased standing balance and decreased balance strategies and  therefore will continue to benefit from skilled OT intervention to enhance overall performance with BADL and Reduce care partner burden.  Patient progressing toward long term goals..  Continue plan of care.  OT Short Term Goals Week 3:  OT Short Term Goal 1 (Week 3): Pt will indicate toileting needs with staff/ family member 50% of time (when with staff/family) OT Short Term Goal 1 - Progress (Week 3): Met OT Short Term Goal 2 (Week 3): Perform 3/3 toileting tasks with supervision OT Short Term Goal 2 - Progress (Week 3): Met OT Short Term Goal 3 (Week 3): Perform basic transfers with supervision OT Short Term Goal 3 - Progress (Week 3): Met OT Short Term Goal 4 (Week 3): Complete LB dressing with min A OT Short Term Goal 4 - Progress (Week 3): Met OT Short Term Goal 5 (Week 3): Pt will self feed with supervision with mod cuing for visual scanning OT Short Term Goal 5 - Progress (Week 3): Met Week 4:  OT Short Term Goal 1 (Week 4): complete family education  OT Short Term Goal 2 (Week 4): STG=LTG  Skilled Therapeutic Interventions/Progress Updates:    continue with POc  Therapy Documentation Precautions:  Precautions Precautions: Fall Precaution Comments: Lt crani, skull cap removed and in abdomen Required Braces or Orthoses: Other Brace/Splint Other Brace/Splint: Helmet  Restrictions Weight Bearing Restrictions: No    Vital Signs: Therapy Vitals Temp: 97.9 F (36.6 C) Temp src: Oral Pulse Rate: 78 Resp: 16 BP: 120/79 mmHg Patient Position, if appropriate: Sitting Oxygen Therapy SpO2: 96 % Pain:  no c/o pain  See FIM for current functional status  Therapy/Group: Individual Therapy  [name] [facility] 02/03/2013, 9:17 AM

## 2013-02-04 ENCOUNTER — Inpatient Hospital Stay (HOSPITAL_COMMUNITY): Payer: BC Managed Care – PPO | Admitting: Physical Therapy

## 2013-02-04 ENCOUNTER — Encounter (HOSPITAL_COMMUNITY): Payer: BC Managed Care – PPO | Admitting: Occupational Therapy

## 2013-02-04 ENCOUNTER — Inpatient Hospital Stay (HOSPITAL_COMMUNITY): Payer: BC Managed Care – PPO | Admitting: Occupational Therapy

## 2013-02-04 ENCOUNTER — Inpatient Hospital Stay (HOSPITAL_COMMUNITY): Payer: BC Managed Care – PPO | Admitting: Speech Pathology

## 2013-02-04 ENCOUNTER — Inpatient Hospital Stay (HOSPITAL_COMMUNITY): Payer: BC Managed Care – PPO | Admitting: *Deleted

## 2013-02-04 NOTE — Progress Notes (Signed)
Recreational Therapy Session Note  Patient Details  Name: ALEXSANDER CAVINS MRN: [id number] Date of Birth: September 09, 1976 Today's Date: 02/04/2013 Time:  915-10 Pain: no c/o Skilled Therapeutic Interventions/Progress Updates:  Session focused on functional ambulation, dynamic balance, vision, attention, problem solving, and RUE use.  Pt ambulated on unit with min assist.  Pt performed floor transfer with Min assist for simple card game -Go Fish" simulating activity he may participate in with daughter at home while in tall kneeling position.  Pt supervision for balance in tall kneeling and mod cues for decreased vision & problem solving.  Therapy/Group: Co-Treatment  Marveen Donlon 02/04/2013, 12:18 PM

## 2013-02-04 NOTE — Progress Notes (Signed)
Occupational Therapy Session Note  Patient Details  Name: BORNA WESSINGER MRN: [id number] Date of Birth: [birth year]  Today's Date: 02/04/2013 Time: 1300-1330 Time Calculation (min): 30 min  Short Term Goals: Week 4:  OT Short Term Goal 1 (Week 4): complete family education  OT Short Term Goal 2 (Week 4): STG=LTG  Skilled Therapeutic Interventions/Progress Updates:    1:1 focus on functional ambulation around unit, toileting sit to stand in bathroom; functional visual scanning during grooming (washing hands) at sink in bathroom. Performing PNF patterns in supine and sitting (some with a ball) focusing on normal patterns of movement for functional ADL tasks.  Therapy Documentation Precautions:  Precautions Precautions: Fall Precaution Comments: Lt crani, skull cap removed and in abdomen Required Braces or Orthoses: Other Brace/Splint Other Brace/Splint: Helmet  Restrictions Weight Bearing Restrictions: No Pain:  no c/o pain  See FIM for current functional status  Therapy/Group: Individual Therapy  [name] [facility] 02/04/2013, 2:32 PM

## 2013-02-04 NOTE — Plan of Care (Signed)
Problem: RH PAIN MANAGEMENT Goal: RH STG PAIN MANAGED AT OR BELOW PT'S PAIN GOAL Pain managed with prn medications :<3. Observe for non-verbal S/S pain.  Outcome: Progressing Pain managed with prn Tylenol

## 2013-02-04 NOTE — Progress Notes (Signed)
Occupational Therapy Session Note  Patient Details  Name: Albert [name] MRN: [id number] Date of Birth: 1976-04-13  Today's Date: 02/04/2013 Time: 0730-0830 Time Calculation (min): 60 min  Short Term Goals: Week 4:  OT Short Term Goal 1 (Week 4): complete family education  OT Short Term Goal 2 (Week 4): STG=LTG  Skilled Therapeutic Interventions/Progress Updates:    1:1 self care retraining at shower level - perform established routine. Pt engaged in conversation about brain injury, home, family (wife, daughter and parents)- asking appropriate questions and mod cuing for word finding/ and for correcting errors in language. Pt with supervision to occasional steady A for standing balance. Mod cuing for sequencing through steps of ADL.  Continue to work on visual scanning through environment and visually busier picture book with pointing to pic for him to scan to with success.  Therapy Documentation Precautions:  Precautions Precautions: Fall Precaution Comments: Lt crani, skull cap removed and in abdomen Required Braces or Orthoses: Other Brace/Splint Other Brace/Splint: Helmet  Restrictions Weight Bearing Restrictions: No Pain: C/o HA- RN notified  See FIM for current functional status  Therapy/Group: Individual Therapy  [name] [facility] 02/04/2013, 8:30 AM

## 2013-02-04 NOTE — Progress Notes (Signed)
Physical Therapy Session Note  Patient Details  Name: Albert [name] MRN: [id number] Date of Birth: October 21, 1975  Today's Date: 02/04/2013 Time: 4098-1191 Time Calculation (min): 55 min   Skilled Therapeutic Interventions/Progress Updates:   Attempted bowling pen set-up + bowling game but pt had difficulty setting them up without knocking other pens down, to avoid frustration therapist switched task. Pt ambulating on unit >150' x 2 working on maintaining conversations and visual scanning while maintaining balance, min assist particularly needed when distracted. Occasionally pt does not fully clear Rt. Foot and this also destabilizes pt. Righting reactions/stabilization on Rt. LE with Lt. LE on block in standing + reaching task and naming colors, min-guard to heavy min assist with fatigue. Pt naming 50% of colors correctly. Practiced dynamic balance and bil. UE coordination while hitting ball with bat then hitting ball in simulated hockey, min assist overall.    Pt initially with decreased language confusion and asking relevant questions about injury however with fatigue by end of session pt had increased difficulty with communication and began making up/confusing higher percentage of words.   Therapy Documentation Precautions:  Precautions Precautions: Fall Precaution Comments: Lt crani, skull cap removed and in abdomen Required Braces or Orthoses: Other Brace/Splint Other Brace/Splint: Helmet  Restrictions Weight Bearing Restrictions: No Pain:  no c/o pain    See FIM for current functional status  Therapy/Group: Individual Therapy  [name] 02/04/2013, 11:54 AM

## 2013-02-04 NOTE — Progress Notes (Signed)
Physical Therapy Note  Patient Details  Name: LOGIN MUCKLEROY MRN: [id number] Date of Birth: May 25, 1976 Today's Date: 02/04/2013  Time: [phone] 60 minutes (cotx with TR)  1:1 No c/o pain.  Pt performed gait training throughout unit with min A, cuing for attention to R to avoid obstacles, requires min A for balance reactions with speed and direction changes.  Stair negotiation x 1 flight with 1 handrail to simulate home, pt able to ascend with 1 UE on handrail, light min A, to descend pt uses both UEs on handrail, min A.  [place] Pies toss game with naming items that start with different letters. Pt improved with ability to name items, needing phonemic cues 65% of the time.  Tall kneeling "go fish" card game for balance, attention, coordination and vision.  Pt requires mod cuing for matching, improving his ability to compensate for visual deficits.  Pt with decreased language of confusion today.  Ndea Kilroy 02/04/2013, 10:53 AM

## 2013-02-04 NOTE — Progress Notes (Signed)
NUTRITION FOLLOW UP  DOCUMENTATION CODES  Per approved criteria   -Severe malnutrition in the context of acute injury   Intervention:    Continue Ensure Complete, Resource Breeze, 30 ml Prostat.   RD to continue to assess enteral tolerance  Nutrition Dx:   Inadequate oral intake now related to variable meal intake AEB variable meal completion. Resolved.  Goal:   Pt to meet >/= 90% of their estimated nutrition needs. Met.  Monitor:   weight, labs, PO intake, supplement acceptance  Assessment:   Pt admitted after fall, positive for ETOH. Has SDH and SAH, multiple facial fractures, s/p decompressive craniectomy of L SDH. Pt with questionable cardiac arrest with CPR. Pt extubated 4/14, developed seizures and required second craniectomy. Pt had trach and PEG 4/22 and extubated on 4/24 and remains on trach collar. Pt has had persistent fevers this admission due to neuro storming.   Working towards d/c date of 5/30. Upgraded to Regular textures on 5/19. Continues without bolus feedings. Receiving free water via tube for tube patency. All meds changed to oral by MD - ordered for Ensure Complete daily, 30 ml Prostat TID, and [employer] daily. Per chart, pt will take supplements variably. PEG tube to be removed tomorrow.   Meal intake tends to vary greatly - consuming 100% of meals. RN confirms that pt is eating very well at this time.  Height: Ht Readings from Last 1 Encounters:  01/03/13 6' (1.829 m)    Weight Status:   Wt Readings from Last 1 Encounters:  01/29/13 179 lb 10.8 oz (81.5 kg)  Wt variable. Wife at bedside reports usual weight PTA was around 200 - 205 lb. This is a weight loss of 16.5 % x 1 month.  Pt meets criteria for severe MALNUTRITION in the context of acute injury as evidenced by 16.5% wt loss x 1 month and moderate muscle wasting.  Re-estimated needs:  Kcal: 2500 - 2800 Protein: at least 120 grams protein Fluid: 2.5 - 2.8 liters daily  Skin: multiple  incisions  Diet Order: General    Intake/Output Summary (Last 24 hours) at 02/04/13 1118 Last data filed at 02/04/13 0900  Gross per 24 hour  Intake    770 ml  Output      0 ml  Net    770 ml    Last BM: 5/24   Labs:  No results found for this basename: NA, K, CL, CO2, BUN, CREATININE, CALCIUM, MG, PHOS, GLUCOSE,  in the last 168 hours  CBG (last 3)  No results found for this basename: GLUCAP,  in the last 72 hours  Scheduled Meds: . baclofen  10 mg Oral BID  . feeding supplement  237 mL Oral Q24H  . feeding supplement  30 mL Oral TID WC  . feeding supplement  1 Container Oral Q24H  . free water  50 mL Per Tube Q12H  . levETIRAcetam  1,500 mg Oral BID  . methylphenidate  15 mg Oral BID WC  . multivitamin with minerals  1 tablet Oral Daily  . pantoprazole  40 mg Oral Daily  . phenytoin  100 mg Oral TID  . propranolol  20 mg Oral TID  . saccharomyces boulardii  250 mg Oral BID    Continuous Infusions: none    Jarold Motto MS, RD, LDN Pager: [phone] After-hours pager: [phone]

## 2013-02-04 NOTE — Progress Notes (Signed)
Speech Language Pathology Daily Session Note  Patient Details  Name: Albert [name] MRN: [id number] Date of Birth: 09/27/1975  Today's Date: 02/04/2013 Time: 7829-5621 Time Calculation (min): 55 min  Short Term Goals: Week 4: SLP Short Term Goal 1 (Week 4): Patient will demonstrate selective attention to task for 15 minutes with Min assist verbal and tactile cues.   SLP Short Term Goal 2 (Week 4): Patient will consume Regular textures and thin liquids via straw with no overt s/s of aspiration and Supervision verbal cues. SLP Short Term Goal 3 (Week 4): Patient will solve basic, familiar problems with Min assist multi-modal clinician cues. SLP Short Term Goal 4 (Week 4): Patient will request help as needed during daily tasks with Min assist question cues. SLP Short Term Goal 5 (Week 4): Patient will self-monitor and correct verbal errors with Max assist clinician cues. SLP Short Term Goal 6 (Week 4): Patient will utilize working memory strategies with Max assist multi-modal cues.  Skilled Therapeutic Interventions: Skilled treatment session focused on addressing dysphagia and cognitive-linguistic goals.  SLP facilitated session with 10oz. thin liquids via straw with no overt s/s of aspiration.  SLP also facilitated session with photos and requesting he describe what people were doing; patient spontaneously completed sentences in 10% of trials and required phonemic cues for accuracy with the rest.  When patient was handed basic ADL objects he was able to label the function of the object; however, he was unable to name item without initial syllable cues. Continue with current plan of care.   FIM:  Comprehension Comprehension Mode: Auditory Comprehension: 4-Understands basic 75 - 89% of the time/requires cueing 10 - 24% of the time Expression Expression Mode: Verbal Expression: 3-Expresses basic 50 - 74% of the time/requires cueing 25 - 50% of the time. Needs to repeat parts of sentences. Social  Interaction Social Interaction: 4-Interacts appropriately 75 - 89% of the time - Needs redirection for appropriate language or to initiate interaction. Problem Solving Problem Solving: 2-Solves basic 25 - 49% of the time - needs direction more than half the time to initiate, plan or complete simple activities Memory Memory: 2-Recognizes or recalls 25 - 49% of the time/requires cueing 51 - 75% of the time FIM - Eating Eating Activity: 5: Needs verbal cues/supervision  Pain Pain Assessment Pain Assessment: No/denies pain  Therapy/Group: Individual Therapy  Charlane Ferretti., CCC-SLP [phone]  Albert [name] 02/04/2013, 4:52 PM

## 2013-02-04 NOTE — Progress Notes (Signed)
Patient ID: Albert [name], male   DOB: 1976/05/31, 37 y.o.   MRN: [id number] Subjective/Complaints: No new issues noted. Still with limited insight A 12 point review of systems has been performed and if not noted above is otherwise negative.     Objective: Vital Signs: Blood pressure 114/68, pulse 82, temperature 98.1 F (36.7 C), temperature source Oral, resp. rate 17, height 6' (1.829 m), weight 81.5 kg (179 lb 10.8 oz), SpO2 100.00%. No results found. No results found for this basename: WBC, HGB, HCT, PLT,  in the last 72 hours No results found for this basename: NA, K, CL, CO, GLUCOSE, BUN, CREATININE, CALCIUM,  in the last 72 hours CBG (last 3)  No results found for this basename: GLUCAP,  in the last 72 hours  Wt Readings from Last 3 Encounters:  01/29/13 81.5 kg (179 lb 10.8 oz)  01/03/13 83.3 kg (183 lb 10.3 oz)  01/03/13 83.3 kg (183 lb 10.3 oz)    Physical Exam:  Eyes:  Pupils reactive to light  Neck: Neck supple. No thyromegaly present.  Cardiovascular: Normal rate and regular rhythm.  Pulmonary/Chest: Effort normal and breath sounds normal. No respiratory distress.  Abdominal: Soft. Bowel sounds are normal. He exhibits no distension.  Neurological:     RUE tone still is trace to 1. RUE is grossly 4/5. RLE is 4 prox to 2-3/5 APF and 0/tr ADF Senses pain on right. DTR's 3+. Answers simple questions. Limited insight and awareness. Confabulates. Remembered my name. Appears have visual field deficits, but inconsistent on exam. I'm in [place]. Can't tell me where. Recalls wife's name Skin:  Craniotomy site and donor sites intact--staples all are out.   Assessment/Plan: 1. Functional deficits secondary to severe left SDH, SAH s/p craniectomy,  which require 3+ hours per day of interdisciplinary therapy in a comprehensive inpatient rehab setting. Physiatrist is providing close team supervision and 24 hour management of active medical problems listed below. Physiatrist and rehab  team continue to assess barriers to discharge/monitor patient progress toward functional and medical goals.  Helmet when OOB Will need formal neuro-optho eval after dc. RLAS V-VI currently.   FIM: FIM - Bathing Bathing Steps Patient Completed: Chest;Right Arm;Left Arm;Abdomen;Front perineal area;Right upper leg;Left upper leg;Buttocks Bathing: 4: Min-Patient completes 8-9 48f 10 parts or 75+ percent  FIM - Upper Body Dressing/Undressing Upper body dressing/undressing steps patient completed: Thread/unthread right sleeve of pullover shirt/dresss;Thread/unthread left sleeve of pullover shirt/dress;Put head through opening of pull over shirt/dress;Pull shirt over trunk Upper body dressing/undressing: 5: Supervision: Safety issues/verbal cues FIM - Lower Body Dressing/Undressing Lower body dressing/undressing steps patient completed: Thread/unthread right underwear leg;Thread/unthread left underwear leg;Pull underwear up/down;Thread/unthread right pants leg;Thread/unthread left pants leg;Pull pants up/down;Don/Doff left shoe Lower body dressing/undressing: 4: Min-Patient completed 75 plus % of tasks  FIM - Toileting Toileting steps completed by patient: Adjust clothing prior to toileting;Performs perineal hygiene;Adjust clothing after toileting Toileting Assistive Devices: Grab bar or rail for support Toileting: 4: Steadying assist  FIM - Diplomatic Services operational officer Devices: Grab bars Toilet Transfers: 4-To toilet/BSC: Min A (steadying Pt. > 75%)  FIM - Bed/Chair Transfer Bed/Chair Transfer Assistive Devices: Bed rails Bed/Chair Transfer: 4: Bed > Chair or W/C: Min A (steadying Pt. > 75%);4: Chair or W/C > Bed: Min A (steadying Pt. > 75%)  FIM - Locomotion: Wheelchair Locomotion: Wheelchair: 2: Travels 50 - 149 ft with minimal assistance (Pt.>75%) FIM - Locomotion: Ambulation Locomotion: Ambulation Assistive Devices: Other (comment) (Bilateral HHA) Ambulation/Gait  Assistance: 1: +2  Total assist Locomotion: Ambulation: 4: Travels 150 ft or more with minimal assistance (Pt.>75%)  Comprehension Comprehension Mode: Auditory Comprehension: 4-Understands basic 75 - 89% of the time/requires cueing 10 - 24% of the time  Expression Expression Mode: Verbal Expression Assistive Devices: 6-Other (Comment) Expression: 3-Expresses basic 50 - 74% of the time/requires cueing 25 - 50% of the time. Needs to repeat parts of sentences.  Social Interaction Social Interaction Mode: Asleep Social Interaction: 4-Interacts appropriately 75 - 89% of the time - Needs redirection for appropriate language or to initiate interaction.  Problem Solving Problem Solving: 2-Solves basic 25 - 49% of the time - needs direction more than half the time to initiate, plan or complete simple activities  Memory Memory: 2-Recognizes or recalls 25 - 49% of the time/requires cueing 51 - 75% of the time  Medical Problem List and Plan:  1. 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 Status post craniotomy evacuation hematoma with insertion of bone flap abdominal left upper quadrant 12/15/2012 with revision after midline shift 12/28/2012 ---RLAS V/VI at this point 2. DVT Prophylaxis/Anticoagulation: SCDs. Dopplers negative 3. Numerous facial fractures. Conservative care per ENT  4. Mood: Ativan as needed, Inderal wean to 20mg  q8 (didn't do Friday) -sleep improved. -ritalin- continue for attention -enclosure bed still required unless someone in room for supervision 5. Neuropsych: This patient is not capable of making decisions on his own behalf.  6. Seizure disorder. keppra and dilantin on board. Wean as outpt    7. Dysphagia. Gastrostomy PEG tube/ 12/31/2012 per trauma services.  -trach stoma healed  -diet D3  -off TF and H2O flushes except for maintenance---dc G tube tomorrow 8. Alcohol abuse. Counseling  9. Wound care: local care to PEG, crani, and  abdominal sites.  -local care-- -helmet   10. Temp: afebrile, stable leukocytosis -urine culture and cxr negative, dopplers negative. 11. Spasticity:   low dose baclofen has helped  -continued therapy, engagement of right side 12. Urinary frequency  -timed voids, bladder education  -working on voiding patterns.     LOS (Days) 32 A FACE TO FACE EVALUATION WAS PERFORMED  Chamaine Stankus T 02/04/2013 7:53 AM

## 2013-02-05 ENCOUNTER — Inpatient Hospital Stay (HOSPITAL_COMMUNITY): Payer: BC Managed Care – PPO | Admitting: Physical Therapy

## 2013-02-05 ENCOUNTER — Inpatient Hospital Stay (HOSPITAL_COMMUNITY): Payer: BC Managed Care – PPO | Admitting: Speech Pathology

## 2013-02-05 ENCOUNTER — Encounter (HOSPITAL_COMMUNITY): Payer: BC Managed Care – PPO | Admitting: Occupational Therapy

## 2013-02-05 ENCOUNTER — Inpatient Hospital Stay (HOSPITAL_COMMUNITY): Payer: BC Managed Care – PPO | Admitting: Occupational Therapy

## 2013-02-05 NOTE — Progress Notes (Signed)
Physical Therapy Note  Patient Details  Name: Albert [name] MRN: [id number] Date of Birth: September 05, 1976 Today's Date: 02/05/2013  Time: 1030-1125 55 minutes  1:1 No c/o pain.  Treatment session focused on pt/family ed with pt's wife.  [name] safely demo'd with pt at min A level gait controlled, home and outdoor environments, stair training multiple flights indoors and outdoors, transfers and safety awareness.  [name] able to redirect pt and cue pt appropriately throughout session.  Education provided on energy conservation, home safety and community safety for mobility.   [name] expresses understanding.  Pt with improving awareness and decreased language of confusion today.    Sydney Azure 02/05/2013, 11:24 AM

## 2013-02-05 NOTE — Patient Care Conference (Signed)
Inpatient RehabilitationTeam Conference and Plan of Care Update Date: 02/04/2013   Time: 3:05 PM    Patient Name: Albert [name]      Medical Record Number: [id number]  Date of Birth: 21-Nov-1975 Sex: Male         Room/Bed: 4002/4002-01 Payor Info: Payor: BLUE [employer] BLUE [employer]IELD / Plan: [employer] [place] PPO / Product Type: *No Product type* /    Admitting Diagnosis: rt femur fx  Admit Date/Time:  01/03/2013  3:23 PM Admission Comments: No comment available   Primary Diagnosis:  TBI (traumatic brain injury) Principal Problem: TBI (traumatic brain injury)  Patient Active Problem List   Diagnosis Date Noted  . Closed fracture of facial bones 01/03/2013  . TBI (traumatic brain injury) 01/03/2013  . Pneumonia 01/03/2013  . Acute blood loss anemia 12/30/2012  . Subdural hemorrhage following injury 12/15/2012  . Respiratory failure following trauma and surgery 12/15/2012  . Cardiac arrest 12/15/2012  . Blunt trauma of face 12/15/2012  . Blunt head trauma 12/15/2012  . Traumatic subarachnoid bleed with LOC of 30 minutes or less 12/15/2012  . Elevated ETOH level 12/15/2012    Expected Discharge Date: Expected Discharge Date: 02/07/13  Team Members Present: Physician leading conference: Dr. Faith Rogue Social Worker Present: Amada Jupiter, LCSW Nurse Present: Daryll Brod, RN OT Present: Mackie Pai, Marye Round, OT SLP Present: Feliberto Gottron, SLP Other (Discipline and Name): Tora Duck, PPS Coordinator     Current Status/Progress Goal Weekly Team Focus  Medical   improving awareness but still with substantial cognitive and visual deficits  see prior, stabilize for dc  family ed, removal of g tube   Bowel/Bladder   Continent of bowel and bladder, per report. LBM 02/01/13.   Min A  Pt to remain continent of bowel and bladder   Swallow/Nutrition/ Hydration   regular textures and thin liquids  least restrictive p.o. intake  family education   ADL's   supervision to steady A   supervision to min A  family education; d/c planning   Mobility   min A  min A  family ed   Communication   Min-Mod expressive with Mod-Max to self-monitor  mod assist overall  continue to increase overall self-monitoring and correcting   Safety/Cognition/ Behavioral Observations  min-mod assist  min assist  continue to expand attention, orientation, awareness and carryover   Pain   No c/o pain  Monitor for nonverbal cues  Monitor   Skin   Crani and abdominal incision healing appropriately. no s/s of infection/breakdown  No new skin breakdown  Assess skin q shift    Rehab Goals Patient on target to meet rehab goals: Yes *See Care Plan and progress notes for long and short-term goals.  Barriers to Discharge: none    Possible Resolutions to Barriers:  none    Discharge Planning/Teaching Needs:  home with wife and parents providing 24/7 assistance      Team Discussion:  Continues to make good progress, however, wife appears increasingly overwhemed.  To have outing tomorrow with wife and daughter.  Family education continues.  Peg out tomorrow as well.    Revisions to Treatment Plan:  None   Continued Need for Acute Rehabilitation Level of Care: The patient requires daily medical management by a physician with specialized training in physical medicine and rehabilitation for the following conditions: Daily direction of a multidisciplinary physical rehabilitation program to ensure safe treatment while eliciting the highest outcome that is of practical value to the patient.: Yes Daily  medical management of patient stability for increased activity during participation in an intensive rehabilitation regime.: Yes Daily analysis of laboratory values and/or radiology reports with any subsequent need for medication adjustment of medical intervention for : Other;Neurological problems;Post surgical problems  Albert [name] 02/05/2013, 2:16 PM

## 2013-02-05 NOTE — Progress Notes (Signed)
Occupational Therapy Session Note  Patient Details  Name: Albert [name] MRN: [id number] Date of Birth: June 02, 1976  Today's Date: 02/05/2013 Time:  -     Short Term Goals: Week 4:  OT Short Term Goal 1 (Week 4): complete family education  OT Short Term Goal 2 (Week 4): STG=LTG  Skilled Therapeutic Interventions/Progress Updates:    Pt got PEG out this am!! Family education with pt and pt's wife with focus on established morning routine including toileting, showering, shower stall transfers, discussion about home adaptation, home routine that might be most effective for home, discussion about pt's body temperature regulation difficulties, functional ambulation, educ about wearing helmet, balance, daily activities [name] can participate in, orientation information (all on home program- to be given out today).  Therapy Documentation Precautions:  Precautions Precautions: Fall Precaution Comments: Lt crani, skull cap removed and in abdomen Required Braces or Orthoses: Other Brace/Splint Other Brace/Splint: Helmet  Restrictions Weight Bearing Restrictions: No Pain:  no c/o pain   See FIM for current functional status  Therapy/Group: Individual Therapy  [name] [facility] 02/05/2013, 11:18 AM

## 2013-02-05 NOTE — Progress Notes (Signed)
Social Work Patient ID: Albert [name], male   DOB: December 10, 1975, 37 y.o.   MRN: [id number]  Review of team conference with family.  Wife completing family education today and this evening with successful outing today per therapies.  Preparing for end of week discharge with follow up at [facility].  Therapies spending much time problem solving with pt's wife about daily activities at home with pt after d/c and strategies she might employ to make this transition as successful as possible.  Support for all ongoing.    Erek Kowal, LCSW

## 2013-02-05 NOTE — Progress Notes (Signed)
Recreational Therapy Session Note  Patient Details  Name: Albert [name] MRN: [id number] Date of Birth: September 29, 1975 Today's Date: 02/05/2013 Time:1300-1515 Pain: no c/o Skilled Therapeutic Interventions/Progress Updates: Pt participated in community reintegration/outing to [employer] and [employer] cream and to their community pool. Purpose of the outing was to provide family education to patient's wife with focus on community pursuits post discharge (ie stairs, car transfers, community ambulation on multiple surface types, problem solving & negotiating obstacles). Patient's daughter was present for outing for pt's wife to practice managing pt's mobility and a 35 yr old. Pt with good activity tolerance and frustration tolerance with encouragement and support.   Therapy/Group: [employer]   Devera Englander 02/05/2013, 4:27 PM

## 2013-02-05 NOTE — Progress Notes (Signed)
Occupational Therapy Note  Patient Details  Name: Albert [name] MRN: [id number] Date of Birth: 26-May-1976 Today's Date: 02/05/2013  Community outing with Rec therapy to [employer] and [employer] cream and to their community pool. Focus of outing was family education with patient's wife with focus on community pursuits post discharge (ie stairs, car transfers, community ambulation on multiple surface types, problem solving negotiating  obstacles). Patient's daughter present for outing for pt's wife to practice managing pt's mobility and a 34 yr old. Pt with good activity tolerance and frustration tolerance with encouragement and support.   [name] [facility] 02/05/2013, 4:00 PM

## 2013-02-05 NOTE — Progress Notes (Signed)
Patient ID: Albert [name], male   DOB: [birth year], 37 y.o.   MRN: [id number] Subjective/Complaints: uneventul night. No complaints. "what tube are you talking." A 12 point review of systems has been performed and if not noted above is otherwise negative.     Objective: Vital Signs: Blood pressure 126/68, pulse 60, temperature 98 F (36.7 C), temperature source Oral, resp. rate 18, height 6' (1.829 m), weight 81.5 kg (179 lb 10.8 oz), SpO2 98.00%. No results found. No results found for this basename: WBC, HGB, HCT, PLT,  in the last 72 hours No results found for this basename: NA, K, CL, CO, GLUCOSE, BUN, CREATININE, CALCIUM,  in the last 72 hours CBG (last 3)  No results found for this basename: GLUCAP,  in the last 72 hours  Wt Readings from Last 3 Encounters:  01/29/13 81.5 kg (179 lb 10.8 oz)  01/03/13 83.3 kg (183 lb 10.3 oz)  01/03/13 83.3 kg (183 lb 10.3 oz)    Physical Exam:  Eyes:  Pupils reactive to light  Neck: Neck supple. No thyromegaly present.  Cardiovascular: Normal rate and regular rhythm.  Pulmonary/Chest: Effort normal and breath sounds normal. No respiratory distress.  Abdominal: Soft. Bowel sounds are normal. He exhibits no distension. G tube in place Neurological:     RUE tone still is trace to 1. RUE is grossly 4/5. RLE is 4 prox to 2-3/5 APF and 0/tr ADF Senses pain on right. DTR's 3+. Answers simple questions. Limited insight and awareness. Confabulates. Remembered my name. Appears have visual field deficits, but inconsistent on exam. I'm in [place]. Can't tell me where. Recalls wife's name Skin:  Craniotomy site and donor sites intact--staples all are out.   Assessment/Plan: 1. Functional deficits secondary to severe left SDH, SAH s/p craniectomy,  which require 3+ hours per day of interdisciplinary therapy in a comprehensive inpatient rehab setting. Physiatrist is providing close team supervision and 24 hour management of active medical problems listed  below. Physiatrist and rehab team continue to assess barriers to discharge/monitor patient progress toward functional and medical goals.  Continued family ed. Wife is realizing the magnitude of his needs.   FIM: FIM - Bathing Bathing Steps Patient Completed: Chest;Right Arm;Left Arm;Abdomen;Front perineal area;Right upper leg;Left upper leg;Buttocks Bathing: 4: Min-Patient completes 8-9 39f 10 parts or 75+ percent  FIM - Upper Body Dressing/Undressing Upper body dressing/undressing steps patient completed: Thread/unthread right sleeve of pullover shirt/dresss;Thread/unthread left sleeve of pullover shirt/dress;Put head through opening of pull over shirt/dress;Pull shirt over trunk Upper body dressing/undressing: 5: Supervision: Safety issues/verbal cues FIM - Lower Body Dressing/Undressing Lower body dressing/undressing steps patient completed: Thread/unthread right underwear leg;Thread/unthread left underwear leg;Pull underwear up/down;Thread/unthread right pants leg;Thread/unthread left pants leg;Pull pants up/down;Don/Doff left shoe Lower body dressing/undressing: 4: Min-Patient completed 75 plus % of tasks  FIM - Toileting Toileting steps completed by patient: Adjust clothing prior to toileting;Performs perineal hygiene;Adjust clothing after toileting Toileting Assistive Devices: Grab bar or rail for support Toileting: 4: Steadying assist  FIM - Diplomatic Services operational officer Devices: Grab bars Toilet Transfers: 4-To toilet/BSC: Min A (steadying Pt. > 75%)  FIM - Bed/Chair Transfer Bed/Chair Transfer Assistive Devices: Bed rails Bed/Chair Transfer: 4: Bed > Chair or W/C: Min A (steadying Pt. > 75%);4: Chair or W/C > Bed: Min A (steadying Pt. > 75%)  FIM - Locomotion: Wheelchair Locomotion: Wheelchair: 2: Travels 50 - 149 ft with minimal assistance (Pt.>75%) FIM - Locomotion: Ambulation Locomotion: Ambulation Assistive Devices: Other (comment) (Bilateral  HHA) Ambulation/Gait Assistance:  1: +2 Total assist Locomotion: Ambulation: 4: Travels 150 ft or more with minimal assistance (Pt.>75%)  Comprehension Comprehension Mode: Auditory Comprehension: 5-Understands basic 90% of the time/requires cueing < 10% of the time  Expression Expression Mode: Verbal Expression Assistive Devices: 6-Other (Comment) Expression: 3-Expresses basic 50 - 74% of the time/requires cueing 25 - 50% of the time. Needs to repeat parts of sentences.  Social Interaction Social Interaction Mode: Asleep Social Interaction: 4-Interacts appropriately 75 - 89% of the time - Needs redirection for appropriate language or to initiate interaction.  Problem Solving Problem Solving: 2-Solves basic 25 - 49% of the time - needs direction more than half the time to initiate, plan or complete simple activities  Memory Memory: 2-Recognizes or recalls 25 - 49% of the time/requires cueing 51 - 75% of the time  Medical Problem List and Plan:  1. 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 Status post craniotomy evacuation hematoma with insertion of bone flap abdominal left upper quadrant 12/15/2012 with revision after midline shift 12/28/2012 ---RLAS V/VI at this point 2. DVT Prophylaxis/Anticoagulation: SCDs. Dopplers negative 3. Numerous facial fractures. Conservative care per ENT  4. Mood: Ativan as needed, Inderal wean to 20mg  q8 (didn't do Friday) -sleep improved. -ritalin- continue for attention -enclosure bed still required unless someone in room for supervision 5. Neuropsych: This patient is not capable of making decisions on his own behalf.  6. Seizure disorder. keppra and dilantin on board. Wean as outpt    7. Dysphagia. Gastrostomy PEG tube/ 12/31/2012 per trauma services.  -trach stoma healed  -diet D3  -Removed G tube with traction. Pressure dressing placed---liquids for lunch and regular dinner tonight. 8. Alcohol abuse.  Counseling  9. Wound care: local care to PEG, crani, and abdominal sites.  -local care-- -helmet   10. Temp: afebrile, stable leukocytosis -urine culture and cxr negative, dopplers negative. 11. Spasticity:   low dose baclofen has helped  -continued therapy, engagement of right side 12. Urinary frequency  -timed voids, bladder education  -working on voiding patterns.     LOS (Days) 33 A FACE TO FACE EVALUATION WAS PERFORMED  SWARTZ,ZACHARY T 02/05/2013 9:26 AM

## 2013-02-05 NOTE — Progress Notes (Signed)
Speech Language Pathology Daily Session Note & Weekly Progress Note  Patient Details  Name: Albert [name] MRN: [id number] Date of Birth: 15-Sep-1975  Today's Date: 02/05/2013 Time: 1130-1210 Time Calculation (min): 40 min  Short Term Goals: Week 4: SLP Short Term Goal 1 (Week 4): Patient will demonstrate selective attention to task for 15 minutes with Min assist verbal and tactile cues.   SLP Short Term Goal 2 (Week 4): Patient will consume Regular textures and thin liquids via straw with no overt s/s of aspiration and Supervision verbal cues. SLP Short Term Goal 3 (Week 4): Patient will solve basic, familiar problems with Min assist multi-modal clinician cues. SLP Short Term Goal 4 (Week 4): Patient will request help as needed during daily tasks with Min assist question cues. SLP Short Term Goal 5 (Week 4): Patient will self-monitor and correct verbal errors with Max assist clinician cues. SLP Short Term Goal 6 (Week 4): Patient will utilize working memory strategies with Max assist multi-modal cues.  Skilled Therapeutic Interventions: Skilled treatment session focused on addressing patient and family education with wife.  SLP facilitated session by verbally educating about dysphagia having resolved and continued cognitive-linguistic deficits.   Receptive abilities and ability to perform functional tasks are strengths and language abilities are an area that continues to need support and cue from others.  Patient asked appropriate about going home with Min assist clinic cues to self-monitor errors and Mod assist cues to correct them.  Wife reported being unable to think of nay now and SLP encouraged her to make a list tonight to discuss tomorrow.     FIM:  Comprehension Comprehension Mode: Auditory Comprehension: 4-Understands basic 75 - 89% of the time/requires cueing 10 - 24% of the time Expression Expression Mode: Verbal Expression: 3-Expresses basic 50 - 74% of the time/requires cueing 25  - 50% of the time. Needs to repeat parts of sentences. Social Interaction Social Interaction: 4-Interacts appropriately 75 - 89% of the time - Needs redirection for appropriate language or to initiate interaction. Problem Solving Problem Solving: 2-Solves basic 25 - 49% of the time - needs direction more than half the time to initiate, plan or complete simple activities Memory Memory: 2-Recognizes or recalls 25 - 49% of the time/requires cueing 51 - 75% of the time  Pain Pain Assessment Pain Assessment: No/denies pain  Therapy/Group: Individual Therapy   Speech Language Pathology Weekly Progress Note  Patient Details  Name: Albert [name] MRN: [id number] Date of Birth: Oct 24, 1975  Today's Date: 02/05/2013  Short Term Goals: Week 4: SLP Short Term Goal 1 (Week 4): Patient will demonstrate selective attention to task for 15 minutes with Min assist verbal and tactile cues.   SLP Short Term Goal 1 - Progress (Week 4): Met SLP Short Term Goal 2 (Week 4): Patient will consume Regular textures and thin liquids via straw with no overt s/s of aspiration and Supervision verbal cues. SLP Short Term Goal 2 - Progress (Week 4): Met SLP Short Term Goal 3 (Week 4): Patient will solve basic, familiar problems with Min assist multi-modal clinician cues. SLP Short Term Goal 3 - Progress (Week 4): Progressing toward goal SLP Short Term Goal 4 (Week 4): Patient will request help as needed during daily tasks with Min assist question cues. SLP Short Term Goal 4 - Progress (Week 4): Met SLP Short Term Goal 5 (Week 4): Patient will self-monitor and correct verbal errors with Max assist clinician cues. SLP Short Term Goal 5 - Progress (Week 4):  Met SLP Short Term Goal 6 (Week 4): Patient will utilize working memory strategies with Max assist multi-modal cues. SLP Short Term Goal 6 - Progress (Week 4): Progressing toward goal Week 5: SLP Short Term Goal 1 (Week 5): Patient will self-monitor and correct verbal  errors with Min assist clinician cues. SLP Short Term Goal 2 (Week 5): Patient will solve basic, familiar problems with Min assist question cues. SLP Short Term Goal 3 (Week 5): Patient will utilize external aids to assist with recall with Max assist multi-modal cues.  Weekly Progress Updates: Patient met 4 out of 6 short term goals this reporting period due to gains in selective attention, problem solving, awareness, verbalizing requests for help and self-monitoring and correcting verbal errors all with a lower level of cuing.  Patient also progressed from a Dys.3 texture diet to regular textures thin via straw.  Patient is currently a Rancho Level VII, though his verbal abilities often make him appear to be a lower level given his continued language of confusion and aphasia.  Given this patient continues to require skilled SLP services to assist with addressing goals for cognitive recovery, lengthening his attention, increasing his orientation, awareness, recall, ability to solve basic/familiar problems  as well as  self-monitoring and correcting his language errors.  By addressing these deficits it will maximize functional independence and reduce burden of care prior to discharge.   Patient and his family also require time this week to focus on completing family education prior to discharge at the end of the week.    SLP Intensity: Minumum of 1-2 x/day, 30 to 90 minutes SLP Frequency: 5 out of 7 days SLP Duration/Estimated Length of Stay: 02/07/13 SLP Treatment/Interventions: Cognitive remediation/compensation;Internal/external aids;Environmental controls;Therapeutic Activities;Patient/family education;Cueing hierarchy;Functional tasks;Speech/Language facilitation  Charlane Ferretti., CCC-SLP [phone]  Min Collymore 02/05/2013, 1:25 PM

## 2013-02-06 ENCOUNTER — Inpatient Hospital Stay (HOSPITAL_COMMUNITY): Payer: BC Managed Care – PPO | Admitting: Speech Pathology

## 2013-02-06 ENCOUNTER — Inpatient Hospital Stay (HOSPITAL_COMMUNITY): Payer: BC Managed Care – PPO | Admitting: *Deleted

## 2013-02-06 ENCOUNTER — Encounter (HOSPITAL_COMMUNITY): Payer: BC Managed Care – PPO | Admitting: Occupational Therapy

## 2013-02-06 ENCOUNTER — Inpatient Hospital Stay (HOSPITAL_COMMUNITY): Payer: BC Managed Care – PPO | Admitting: Occupational Therapy

## 2013-02-06 ENCOUNTER — Inpatient Hospital Stay (HOSPITAL_COMMUNITY): Payer: BC Managed Care – PPO | Admitting: Physical Therapy

## 2013-02-06 MED ORDER — LEVETIRACETAM 750 MG PO TABS
1500.0000 mg | ORAL_TABLET | Freq: Two times a day (BID) | ORAL | Status: DC
  Administered 2013-02-06 – 2013-02-07 (×3): 1500 mg via ORAL
  Filled 2013-02-06 (×5): qty 2

## 2013-02-06 MED ORDER — PHENYTOIN 50 MG PO CHEW
100.0000 mg | CHEWABLE_TABLET | Freq: Two times a day (BID) | ORAL | Status: DC
  Administered 2013-02-06 – 2013-02-07 (×3): 100 mg via ORAL
  Filled 2013-02-06 (×5): qty 2

## 2013-02-06 MED ORDER — PROPRANOLOL HCL 20 MG PO TABS
20.0000 mg | ORAL_TABLET | Freq: Two times a day (BID) | ORAL | Status: DC
  Administered 2013-02-06 – 2013-02-07 (×3): 20 mg via ORAL
  Filled 2013-02-06 (×5): qty 1

## 2013-02-06 NOTE — Progress Notes (Signed)
Patient ID: Albert [name], male   DOB: 06/17/1976, 37 y.o.   MRN: [id number] Subjective/Complaints: Wife slept with him last night without issues. Went through day. A 12 point review of systems has been performed and if not noted above is otherwise negative.     Objective: Vital Signs: Blood pressure 110/72, pulse 61, temperature 98.3 F (36.8 C), temperature source Oral, resp. rate 19, height 6' (1.829 m), weight 80.105 kg (176 lb 9.6 oz), SpO2 97.00%. No results found. No results found for this basename: WBC, HGB, HCT, PLT,  in the last 72 hours No results found for this basename: NA, K, CL, CO, GLUCOSE, BUN, CREATININE, CALCIUM,  in the last 72 hours CBG (last 3)  No results found for this basename: GLUCAP,  in the last 72 hours  Wt Readings from Last 3 Encounters:  02/05/13 80.105 kg (176 lb 9.6 oz)  01/03/13 83.3 kg (183 lb 10.3 oz)  01/03/13 83.3 kg (183 lb 10.3 oz)    Physical Exam:  Eyes:  Pupils reactive to light  Neck: Neck supple. No thyromegaly present.  Cardiovascular: Normal rate and regular rhythm.  Pulmonary/Chest: Effort normal and breath sounds normal. No respiratory distress.  Abdominal: Soft. Bowel sounds are normal. He exhibits no distension. G tube site healingnicely. Neurological:     RUE tone still is trace to 1. RUE is grossly 4/5. RLE is 4 prox to 3-4/5 APF and 3 to 3+ ADF Senses pain on right. DTR's 3+. Answers simple questions. Limited insight and awareness.  Very calm.    Appears have visual field deficits, but inconsistent on exam.  Skin:  Craniotomy site and donor sites intact--staples all are out.   Assessment/Plan: 1. Functional deficits secondary to severe left SDH, SAH s/p craniectomy,  which require 3+ hours per day of interdisciplinary therapy in a comprehensive inpatient rehab setting. Physiatrist is providing close team supervision and 24 hour management of active medical problems listed below. Physiatrist and rehab team continue to assess  barriers to discharge/monitor patient progress toward functional and medical goals.  Continued family ed prior to dc. Wife feels like yesterday was very helpful.   FIM: FIM - Bathing Bathing Steps Patient Completed: Chest;Right Arm;Left Arm;Abdomen;Front perineal area;Right upper leg;Left upper leg;Buttocks;Left lower leg (including foot);Right lower leg (including foot) Bathing: 5: Set-up assist to: Adjust water temp  FIM - Upper Body Dressing/Undressing Upper body dressing/undressing steps patient completed: Thread/unthread right sleeve of pullover shirt/dresss;Thread/unthread left sleeve of pullover shirt/dress;Put head through opening of pull over shirt/dress;Pull shirt over trunk Upper body dressing/undressing: 5: Supervision: Safety issues/verbal cues FIM - Lower Body Dressing/Undressing Lower body dressing/undressing steps patient completed: Thread/unthread right underwear leg;Thread/unthread left underwear leg;Pull underwear up/down;Thread/unthread right pants leg;Thread/unthread left pants leg;Pull pants up/down;Don/Doff left shoe Lower body dressing/undressing: 4: Min-Patient completed 75 plus % of tasks  FIM - Toileting Toileting steps completed by patient: Adjust clothing prior to toileting;Adjust clothing after toileting;Performs perineal hygiene Toileting Assistive Devices: Grab bar or rail for support Toileting: 5: Set-up assist to: Obtain supplies  FIM - Diplomatic Services operational officer Devices: Grab bars Toilet Transfers: 5-Set-up assist to: Apply orthosis/W/C setup;5-To toilet/BSC: Supervision (verbal cues/safety issues);5-From toilet/BSC: Supervision (verbal cues/safety issues)  FIM - Banker Devices: Bed rails Bed/Chair Transfer: 5: Chair or W/C > Bed: Supervision (verbal cues/safety issues);5: Bed > Chair or W/C: Supervision (verbal cues/safety issues)  FIM - Locomotion: Wheelchair Locomotion: Wheelchair: 2:  Travels 50 - 149 ft with minimal assistance (Pt.>75%) FIM - Locomotion:  Ambulation Locomotion: Ambulation Assistive Devices: Other (comment) (Bilateral HHA) Ambulation/Gait Assistance: 1: +2 Total assist Locomotion: Ambulation: 4: Travels 150 ft or more with minimal assistance (Pt.>75%)  Comprehension Comprehension Mode: Auditory Comprehension: 3-Understands basic 50 - 74% of the time/requires cueing 25 - 50%  of the time  Expression Expression Mode: Verbal Expression Assistive Devices: 6-Other (Comment) Expression: 3-Expresses basic 50 - 74% of the time/requires cueing 25 - 50% of the time. Needs to repeat parts of sentences.  Social Interaction Social Interaction Mode: Asleep Social Interaction: 3-Interacts appropriately 50 - 74% of the time - May be physically or verbally inappropriate.  Problem Solving Problem Solving: 2-Solves basic 25 - 49% of the time - needs direction more than half the time to initiate, plan or complete simple activities  Memory Memory: 2-Recognizes or recalls 25 - 49% of the time/requires cueing 51 - 75% of the time  Medical Problem List and Plan:  1. 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 Status post craniotomy evacuation hematoma with insertion of bone flap abdominal left upper quadrant 12/15/2012 with revision after midline shift 12/28/2012 ---RLAS V/VI at this point 2. DVT Prophylaxis/Anticoagulation: SCDs. Dopplers negative 3. Numerous facial fractures. Conservative care per ENT  4. Mood: Ativan as needed, Inderal wean to 20mg  bid -sleep improved. -ritalin- continue for attention -enclosure bed still required unless someone in room for supervision 5. Neuropsych: This patient is not capable of making decisions on his own behalf.  6. Seizure disorder. keppra and dilantin on board. Begin to wean dilantin   7. Dysphagia. Gastrostomy PEG tube/ 12/31/2012 per trauma services.  -trach stoma healed  -diet  regular  -g tube out without issues. 8. Alcohol abuse. Counseling  9. Wound care: local care to PEG, crani, and abdominal sites.  -local care-- -helmet   10. Temp: afebrile, stable leukocytosis -urine culture and cxr negative, dopplers negative. 11. Spasticity:   low dose baclofen has helped  -continued therapy, engagement of right side 12. Urinary frequency  -timed voids, bladder education  -working on voiding patterns.     LOS (Days) 34 A FACE TO FACE EVALUATION WAS PERFORMED  Munachimso Rigdon T 02/06/2013 8:19 AM

## 2013-02-06 NOTE — Progress Notes (Signed)
Occupational Therapy Discharge Summary  Patient Details  Name: Albert [name] MRN: [id number] Date of Birth: [birth year]  Today's Date: 02/06/2013 Time: 0900-0955 Time Calculation (min): 55 min  1:1 continued family educaiton with wife with wife taking full responsibility of pt's ADL care this am with therapist coming in and out as she had questions; provided therapy team's home cognitive program to her and schedule templates to use at home to help maintain an established routine.  1:1 11:00-12:00 continued family education with pt's mother and father with pt's daughter present. Provided and went over pt's cognitive home program with them. Practiced functional ambulation to bathroom and toileting, with tips for success in bathroom; functional ambulation inside and outside (nagivating obstacles and uneven surfaces in community) as well as manage pt's balance and pt 35 year old daughter    Patient has met 12 of 12 long term goals due to improved activity tolerance, improved balance, postural control, ability to compensate for deficits, functional use of  RIGHT upper and RIGHT lower extremity, improved attention, improved awareness and improved coordination.  Patient to discharge at [facility] Assist level. Pt continues to have challenges in visual perceptual, right LE and UE tightness and weakness, activity tolerance, functional ambulation (requiring A), cognition and expressive language.  Patient's care partner is independent to provide the necessary physical and cognitive assistance at discharge.    Reasons goals not met: n/a  Recommendation:  Patient will benefit from ongoing skilled OT services in outpatient setting to continue to advance functional skills in the area of BADL.  Equipment: w/c with cushion, shower seat, helmet  Reasons for discharge: treatment goals met and discharge from hospital  Patient/family agrees with progress made and goals achieved: Yes  OT  Discharge Precautions/Restrictions  Precautions Precautions: Fall Precaution Comments: Lt crani, skull cap removed and in abdomen, wear helmet when walking Other Brace/Splint: Helmet  Restrictions Weight Bearing Restrictions: No General   Vital Signs Therapy Vitals Temp: 98.3 F (36.8 C) Temp src: Oral Pulse Rate: 61 Resp: 19 BP: 110/72 mmHg Patient Position, if appropriate: Lying Oxygen Therapy SpO2: 97 % O2 Device: None (Room air) Pain Pain Assessment Pain Assessment: No/denies pain Pain Score: 0-No pain ADL   Vision/Perception  Vision - History Baseline Vision: No visual deficits Patient Visual Report: Eye fatigue/eye pain/headache;Undershooting;Unable to keep objects in focus;Blurring of vision Vision - Assessment Eye Alignment: Impaired (comment) Vision Assessment: Vision tested Alignment/Gaze Preference: Gaze left Tracking/Visual Pursuits: Decreased smoothness of horizontal tracking;Decreased smoothness of vertical tracking;Requires cues, head turns, or add eye shifts to track Saccades: Additional eye shifts occurred during testing;Undershoots;Decreased speed of saccadic movement Convergence: Impaired (comment) Visual Fields: Right visual field deficit Perception Perception: Impaired Spatial Orientation: decreased Praxis Praxis: Impaired Praxis Impairment Details: Motor planning  Cognition Overall Cognitive Status: Impaired/Different from baseline Arousal/Alertness: Awake/alert Orientation Level: Oriented to person;Oriented to time;Oriented to place Attention: Sustained Focused Attention: Appears intact Sustained Attention: Impaired Sustained Attention Impairment: Functional basic Memory: Impaired Memory Impairment: Storage deficit;Retrieval deficit;Decreased recall of new information;Decreased long term memory;Decreased short term memory Decreased Long Term Memory: Functional basic Decreased Short Term Memory: Functional basic Awareness:  Impaired Awareness Impairment: Intellectual impairment Problem Solving: Impaired Problem Solving Impairment: Functional basic Executive Function: Reasoning;Sequencing;Organizing;Decision Making Reasoning: Impaired Reasoning Impairment: Functional basic Sequencing: Impaired Sequencing Impairment: Functional basic Organizing: Impaired Organizing Impairment: Functional basic Decision Making: Impaired Decision Making Impairment: Functional basic Behaviors: Poor frustration tolerance Safety/Judgment: Impaired Rancho Mirant Scales of Cognitive Functioning: Automatic/appropriate Sensation Sensation Light Touch: Appears Intact Proprioception: Appears  Intact Coordination Gross Motor Movements are Fluid and Coordinated: No Fine Motor Movements are Fluid and Coordinated: No Motor  Motor Motor: Hemiplegia;Abnormal tone Mobility  Bed Mobility Supine to Sit: 6: Modified independent (Device/Increase time) Sit to Supine: 6: Modified independent (Device/Increase time) Transfers Sit to Stand: 5: Supervision Stand to Sit: 5: Supervision  Trunk/Postural Assessment  Cervical Assessment Cervical Assessment: Within Functional Limits Thoracic Assessment Thoracic Assessment: Within Functional Limits Lumbar Assessment Lumbar Assessment: Within Functional Limits Postural Control Postural Control: Deficits on evaluation Righting Reactions: delayed Protective Responses: delayed  Balance Balance Balance Assessed: Yes Static Sitting Balance Static Sitting - Level of Assistance: 7: Independent Static Standing Balance Static Standing - Level of Assistance: 5: Stand by assistance Dynamic Standing Balance Dynamic Standing - Level of Assistance: 4: Min assist Extremity/Trunk Assessment RUE Assessment RUE Assessment: Exceptions to WFL (Brunstrom VII, ) RUE AROM (degrees) RUE Overall AROM Comments: difficulty with full shoulder flexion and elbow extension LUE Assessment LUE Assessment:  Within Functional Limits  See FIM for current functional status  [name] [facility] 02/06/2013, 9:56 AM

## 2013-02-06 NOTE — Progress Notes (Signed)
Recreational Therapy Discharge Summary Patient Details  Name: Albert [name] MRN: [id number] Date of Birth: 24-May-1976 Today's Date: 02/06/2013  Long term goals set: 2  Long term goals met: 2  Comments on progress toward goals: Pt met 2/2 LTGs and is ready for discharge home with family to provide 24 hour supervision/assist.  Pt's family has been observing & participating in therapy session throughout LOS.  Pt is discharging home at overall Min assist level for leisure tasks & community pursuits.  Reasons for discharge: treatment goals met and discharge from hospital Patient/family agrees with progress made and goals achieved: Yes  [name] 02/06/2013, 3:22 PM

## 2013-02-06 NOTE — Progress Notes (Signed)
Physical Therapy Discharge Summary  Patient Details  Name: Albert [name] MRN: [id number] Date of Birth: September 23, 1975  Today's Date: 02/06/2013  Patient has met 9 of 10 long term goals due to improved activity tolerance, improved balance, improved postural control, increased strength, ability to compensate for deficits, functional use of  right upper extremity and right lower extremity, improved attention, improved awareness and improved coordination.  Patient to discharge at an ambulatory level Min Assist.  Pt is currently min A for gait and stairs, supervision for transfers.  Min A for intellectual awareness.  [name], mom and dad have completed family education and been educated on home safety, energy conservation and home program for safe d/c. Patient's care partner is independent to provide the necessary physical and cognitive assistance at discharge.  Reasons goals not met: pt continues to require min A for intellectual awareness  Recommendation:  Patient will benefit from ongoing skilled PT services in outpatient setting to continue to advance safe functional mobility, address ongoing impairments in balance, gait, mobility, attention, awareness, and minimize fall risk.  Equipment: w/c for community mobility  Reasons for discharge: treatment goals met and discharge from hospital  Patient/family agrees with progress made and goals achieved: Yes  PT Discharge  Cognition Overall Cognitive Status: Impaired/Different from baseline Arousal/Alertness: Awake/alert Attention: Selective Focused Attention: Appears intact Sustained Attention: Impaired Sustained Attention Impairment: Functional complex Selective Attention: Impaired Divided Attention: Impaired Memory: Impaired Awareness: Impaired Awareness Impairment: Intellectual impairment Behaviors: Poor frustration tolerance Safety/Judgment: Impaired Rancho Mirant Scales of Cognitive Functioning:  Automatic/appropriate Sensation Sensation Light Touch: Appears Intact Proprioception: Impaired Detail Proprioception Impaired Details: Impaired RLE Coordination Gross Motor Movements are Fluid and Coordinated: No Fine Motor Movements are Fluid and Coordinated: No Motor  Motor Motor: Hemiplegia;Abnormal tone   Trunk/Postural Assessment  Cervical Assessment Cervical Assessment: Within Functional Limits Thoracic Assessment Thoracic Assessment: Within Functional Limits Lumbar Assessment Lumbar Assessment: Within Functional Limits Postural Control Postural Control: Deficits on evaluation Righting Reactions: delayed Protective Responses: delayed  Balance Static Standing Balance Static Standing - Balance Support: During functional activity Static Standing - Level of Assistance: 5: Stand by assistance Dynamic Standing Balance Dynamic Standing - Balance Support: During functional activity Dynamic Standing - Level of Assistance: 4: Min assist Extremity Assessment      RLE Assessment RLE Assessment:  (grossly 3/5) LLE Assessment LLE Assessment: Within Functional Limits  See FIM for current functional status  Shariff Lasky 02/06/2013, 2:09 PM

## 2013-02-06 NOTE — Discharge Summary (Signed)
Discharge summary job # [id number]

## 2013-02-06 NOTE — Progress Notes (Signed)
Physical Therapy Note  Patient Details  Name: Albert [name] MRN: [id number] Date of Birth: 01/13/1976 Today's Date: 02/06/2013  Time 1: 1015-1057 42 minutes  1:1 No c/o pain.  Treatment focused on pt/family ed with pt's mom and dad.  Pt's parents performed gait, car transfers, furniture transfers and stair negotiation with pt at min A level. Parents educated on safety ideas, energy conservation for safe d/c home.  Parents report they feel comfortable to assist with pt's care at d/c home.  Time 2: 1415-1445 30 minutes  1:1 No c/o pain.  Pt asking appropriate and insightful questions regarding injury and timeline of hospital stay as well as personal information (job, address, etc)  Pt's questions answered with pt providing appropriate follow up questions with improved processing and awareness.  Pt's father present for session.  Pt/father report they feel comfortable with d/c home tomorrow.  Albert [name] 02/06/2013, 11:01 AM

## 2013-02-06 NOTE — Progress Notes (Signed)
Speech Language Pathology Daily Session Note  Patient Details  Name: Albert [name] MRN: [id number] Date of Birth: 01-26-1976  Today's Date: 02/06/2013 Time: 1450-1535 Time Calculation (min): 45 min  Short Term Goals: Week 5: SLP Short Term Goal 1 (Week 5): Patient will self-monitor and correct verbal errors with Min assist clinician cues. SLP Short Term Goal 2 (Week 5): Patient will solve basic, familiar problems with Min assist question cues. SLP Short Term Goal 3 (Week 5): Patient will utilize external aids to assist with recall with Max assist multi-modal cues.  Skilled Therapeutic Interventions: Skilled treatment session focused on addressing cognitive-linguistic goals and family education with parents. SLP facilitated session with photos and requesting he describe what people were doing; patient spontaneously verbalized an appropriate response in 3/10 opportunities, completed sentences in 5/10 of trials and required phonemic cues for accuracy with the rest. When patient was handed basic ADL objects he was able to label the function of the object and name it in 3/10 opportunities and required initial phoneme cues with the rest. SLP requested if patient's parents had any questions and father could not think of any.    FIM:  Comprehension Comprehension Mode: Auditory Comprehension: 4-Understands basic 75 - 89% of the time/requires cueing 10 - 24% of the time Expression Expression Mode: Verbal Expression: 3-Expresses basic 50 - 74% of the time/requires cueing 25 - 50% of the time. Needs to repeat parts of sentences. Social Interaction Social Interaction: 4-Interacts appropriately 75 - 89% of the time - Needs redirection for appropriate language or to initiate interaction. Problem Solving Problem Solving: 2-Solves basic 25 - 49% of the time - needs direction more than half the time to initiate, plan or complete simple activities Memory Memory: 2-Recognizes or recalls 25 - 49% of the  time/requires cueing 51 - 75% of the time FIM - Eating Eating Activity: 5: Needs verbal cues/supervision  Pain Pain Assessment Pain Assessment: Yes Pain Score:  (Unable to rate, states he has a headache) Faces Pain Scale: Hurts little more Pain Type: Acute pain Pain Location: Head Pain Descriptors / Indicators: Headache Pain Frequency: Occasional Pain Onset: Gradual Patients Stated Pain Goal: 2 Pain Intervention(s): Medication (See eMAR);Repositioned;Emotional support Multiple Pain Sites: No  Therapy/Group: Individual Therapy  Charlane Ferretti., CCC-SLP [phone]  Bless Belshe 02/06/2013, 4:37 PM

## 2013-02-06 NOTE — Progress Notes (Signed)
Nursing Note: Wife spent the night and provided care w/ enclosure bed open and had a great night w/ o any difficulities.Pt slept well.wbb

## 2013-02-07 DIAGNOSIS — S069X9A Unspecified intracranial injury with loss of consciousness of unspecified duration, initial encounter: Secondary | ICD-10-CM

## 2013-02-07 MED ORDER — ADULT MULTIVITAMIN W/MINERALS CH: 1.0000 | ORAL_TABLET | Freq: Every day | ORAL | Status: AC

## 2013-02-07 MED ORDER — LEVETIRACETAM 750 MG PO TABS: 1500.0000 mg | ORAL_TABLET | Freq: Two times a day (BID) | ORAL | Status: DC

## 2013-02-07 MED ORDER — BACLOFEN 10 MG PO TABS: 10.0000 mg | ORAL_TABLET | Freq: Two times a day (BID) | ORAL | Status: DC

## 2013-02-07 MED ORDER — METHYLPHENIDATE HCL 5 MG PO TABS: 15.0000 mg | ORAL_TABLET | Freq: Two times a day (BID) | ORAL | Status: DC

## 2013-02-07 MED ORDER — PROPRANOLOL HCL 20 MG PO TABS: 20.0000 mg | ORAL_TABLET | Freq: Two times a day (BID) | ORAL | Status: DC

## 2013-02-07 MED ORDER — LORAZEPAM 0.5 MG PO TABS: 0.5000 mg | ORAL_TABLET | ORAL | Status: DC | PRN

## 2013-02-07 MED ORDER — SACCHAROMYCES BOULARDII 250 MG PO CAPS: 250.0000 mg | ORAL_CAPSULE | Freq: Two times a day (BID) | ORAL | Status: DC
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
## 2013-02-07 NOTE — Progress Notes (Signed)
Social Work  Discharge Note  The overall goal for the admission was met for:   Discharge location: Yes - home with wife and other family (parents) to provide 24/7 assistance  Length of Stay: Yes - 35 days  Discharge activity level: Yes - supervision to minimal assistance  Home/community participation: Yes  Services provided included: MD, RD, PT, OT, SLP, RN, TR, Pharmacy, Neuropsych and SW  Financial Services: Private Insurance: BCBS PPO  Follow-up services arranged: Outpatient: PT, OT, ST via [facility], DME: 18x18 Breezy w/c, cushion, tub seat via [facility], Other: provided info on TBI resources/ supports and Patient/Family has no preference for HH/DME agencies  Comments (or additional information):  Patient/Family verbalized understanding of follow-up arrangements: Yes  Individual responsible for coordination of the follow-up plan: wife  Confirmed correct DME delivered: Amada Jupiter 02/07/2013    Roverto Bodmer

## 2013-02-07 NOTE — Progress Notes (Signed)
Patient ID: Albert [name], male   DOB: 03-30-1976, 37 y.o.   MRN: [id number] Subjective/Complaints: No new issues. "i'm going home today." A 12 point review of systems has been performed and if not noted above is otherwise negative.     Objective: Vital Signs: Blood pressure 111/71, pulse 73, temperature 98.3 F (36.8 C), temperature source Oral, resp. rate 18, height 6' (1.829 m), weight 80.105 kg (176 lb 9.6 oz), SpO2 96.00%. No results found. No results found for this basename: WBC, HGB, HCT, PLT,  in the last 72 hours No results found for this basename: NA, K, CL, CO, GLUCOSE, BUN, CREATININE, CALCIUM,  in the last 72 hours CBG (last 3)  No results found for this basename: GLUCAP,  in the last 72 hours  Wt Readings from Last 3 Encounters:  02/05/13 80.105 kg (176 lb 9.6 oz)  01/03/13 83.3 kg (183 lb 10.3 oz)  01/03/13 83.3 kg (183 lb 10.3 oz)    Physical Exam:  Eyes:  Pupils reactive to light  Neck: Neck supple. No thyromegaly present.  Cardiovascular: Normal rate and regular rhythm.  Pulmonary/Chest: Effort normal and breath sounds normal. No respiratory distress.  Abdominal: Soft. Bowel sounds are normal. He exhibits no distension. G tube site closed. Neurological:     RUE tone still is trace to 1. RUE is grossly 4/5. RLE is 4 prox to 3-4/5 APF and 3 to 3+ ADF Senses pain on right. DTR's 3+. Answers simple questions. Limited insight and awareness.  Very calm.    Appears have visual field deficits, but inconsistent on exam.  Skin:  Craniotomy site and donor sites intact--staples all are out.   Assessment/Plan: 1. Functional deficits secondary to severe left SDH, SAH s/p craniectomy,  which require 3+ hours per day of interdisciplinary therapy in a comprehensive inpatient rehab setting. Physiatrist is providing close team supervision and 24 hour management of active medical problems listed below. Physiatrist and rehab team continue to assess barriers to discharge/monitor  patient progress toward functional and medical goals.  DC home today. Extensive family education performed.  Flap replacement in July likely  FIM: FIM - Bathing Bathing Steps Patient Completed: Chest;Right Arm;Left Arm;Abdomen;Front perineal area;Right upper leg;Left upper leg;Buttocks;Left lower leg (including foot);Right lower leg (including foot) Bathing: 5: Set-up assist to: Adjust water temp  FIM - Upper Body Dressing/Undressing Upper body dressing/undressing steps patient completed: Thread/unthread right sleeve of pullover shirt/dresss;Thread/unthread left sleeve of pullover shirt/dress;Put head through opening of pull over shirt/dress;Pull shirt over trunk Upper body dressing/undressing: 5: Supervision: Safety issues/verbal cues FIM - Lower Body Dressing/Undressing Lower body dressing/undressing steps patient completed: Thread/unthread right underwear leg;Thread/unthread left underwear leg;Pull underwear up/down;Thread/unthread right pants leg;Thread/unthread left pants leg;Pull pants up/down;Don/Doff left shoe Lower body dressing/undressing: 4: Min-Patient completed 75 plus % of tasks  FIM - Toileting Toileting steps completed by patient: Adjust clothing prior to toileting;Adjust clothing after toileting;Performs perineal hygiene Toileting Assistive Devices: Grab bar or rail for support Toileting: 5: Set-up assist to: Obtain supplies  FIM - Diplomatic Services operational officer Devices: Grab bars Toilet Transfers: 5-Set-up assist to: Apply orthosis/W/C setup;5-To toilet/BSC: Supervision (verbal cues/safety issues);5-From toilet/BSC: Supervision (verbal cues/safety issues)  FIM - Banker Devices: Bed rails Bed/Chair Transfer: 5: Bed > Chair or W/C: Supervision (verbal cues/safety issues);5: Chair or W/C > Bed: Supervision (verbal cues/safety issues)  FIM - Locomotion: Wheelchair Locomotion: Wheelchair: 2: Travels 50 - 149 ft with  minimal assistance (Pt.>75%) FIM - Locomotion: Ambulation Locomotion: Ambulation Assistive Devices:  Other (comment) (Bilateral HHA) Ambulation/Gait Assistance: 1: +2 Total assist Locomotion: Ambulation: 4: Travels 150 ft or more with minimal assistance (Pt.>75%)  Comprehension Comprehension Mode: Auditory Comprehension: 4-Understands basic 75 - 89% of the time/requires cueing 10 - 24% of the time  Expression Expression Mode: Verbal Expression Assistive Devices: 6-Other (Comment) Expression: 3-Expresses basic 50 - 74% of the time/requires cueing 25 - 50% of the time. Needs to repeat parts of sentences.  Social Interaction Social Interaction Mode: Asleep Social Interaction: 4-Interacts appropriately 75 - 89% of the time - Needs redirection for appropriate language or to initiate interaction.  Problem Solving Problem Solving: 4-Solves basic 75 - 89% of the time/requires cueing 10 - 24% of the time  Memory Memory: 2-Recognizes or recalls 25 - 49% of the time/requires cueing 51 - 75% of the time  Medical Problem List and Plan:  1. 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 Status post craniotomy evacuation hematoma with insertion of bone flap abdominal left upper quadrant 12/15/2012 with revision after midline shift 12/28/2012 ---RLAS VI at this point 2. DVT Prophylaxis/Anticoagulation: SCDs. Dopplers negative 3. Numerous facial fractures. Conservative care per ENT  4. Mood: Ativan as needed, Inderal wean to 20mg  bid -sleep improved. -ritalin- continue for attention -enclosure bed still required unless someone in room for supervision 5. Neuropsych: This patient is not capable of making decisions on his own behalf.  6. Seizure disorder. keppra and dilantin on board. Begin to wean dilantin   7. Dysphagia. Gastrostomy PEG tube/ 12/31/2012 per trauma services.  -trach stoma healed  -diet regular  -g tube site healed. 8. Alcohol abuse. Counseling   9. Wound care: local care to PEG, crani, and abdominal sites.  -local care-- -helmet   10. Temp: afebrile, stable leukocytosis -urine culture and cxr negative, dopplers negative. 11. Spasticity:   low dose baclofen has helped  -continued therapy, engagement of right side 12. Urinary frequency  -timed voids, bladder education  -working on voiding patterns.     LOS (Days) 35 A FACE TO FACE EVALUATION WAS PERFORMED  Albert [name] T 02/07/2013 8:20 AM

## 2013-02-07 NOTE — Progress Notes (Signed)
Patient, wife, and mother received discharge instructions from Deatra Ina, [place].  All questions answered.  Patient escorted via wheelchair to family's car by Lelon Mast, NT.  Patient discharged home with wife and family.

## 2013-02-07 NOTE — Progress Notes (Signed)
Speech Language Pathology Discharge Summary  Patient Details  Name: Albert [name] MRN: [id number] Date of Birth: Jun 24, 1976  Today's Date: 02/07/2013  Patient has met 9 of 9 long term goals.  Patient to discharge at overall Supervision;Mod level.  Reasons goals not met: n/a   Clinical Impression/Discharge Summary: Patient met 9 out of 9 long term goals this reporting period due to gains in overall cognition and swallow function.  Patient progressed from max-total assist with basic, familiar task, NPO and a trach with PMSV to Supervision with basic familiar functional tasks such as self-feeding regular textures and thin liquids after decannulation.   Patient's verbal expression initially thought to be language of confusion; however, as patient's cognition has improved is now thought to have an expressive aphasia component with anomia at times during conversation.  Patient continues to have inconsistent awareness of his verbal errors and can require Mod assist at times when fatigued.  As a result, this patient continues to require skilled SLP service to address these deficits, maximize functional independence and reduce overall burden of care.   Care Partner:  Caregiver Able to Provide Assistance: Yes  Type of Caregiver Assistance: Physical;Cognitive  Recommendation:  Outpatient SLP;24 hour supervision/assistance  Rationale for SLP Follow Up: Maximize functional communication;Maximize cognitive function and independence;Reduce caregiver burden   Equipment: none   Reasons for discharge: Treatment goals met;Discharged from hospital   Patient/Family Agrees with Progress Made and Goals Achieved: Yes   See FIM for current functional status  Charlane Ferretti., CCC-SLP [phone]  Allen Egerton 02/07/2013, 8:29 AM

## 2013-02-10 ENCOUNTER — Ambulatory Visit: Payer: BC Managed Care – PPO | Attending: Physical Medicine & Rehabilitation

## 2013-02-10 ENCOUNTER — Ambulatory Visit: Payer: BC Managed Care – PPO | Admitting: Occupational Therapy

## 2013-02-10 ENCOUNTER — Ambulatory Visit: Payer: BC Managed Care – PPO | Admitting: Speech Pathology

## 2013-02-10 DIAGNOSIS — R4189 Other symptoms and signs involving cognitive functions and awareness: Secondary | ICD-10-CM | POA: Insufficient documentation

## 2013-02-10 DIAGNOSIS — M2420 Disorder of ligament, unspecified site: Secondary | ICD-10-CM | POA: Insufficient documentation

## 2013-02-10 DIAGNOSIS — M629 Disorder of muscle, unspecified: Secondary | ICD-10-CM | POA: Insufficient documentation

## 2013-02-10 DIAGNOSIS — R279 Unspecified lack of coordination: Secondary | ICD-10-CM | POA: Insufficient documentation

## 2013-02-10 DIAGNOSIS — R4701 Aphasia: Secondary | ICD-10-CM | POA: Insufficient documentation

## 2013-02-10 DIAGNOSIS — Z5189 Encounter for other specified aftercare: Secondary | ICD-10-CM | POA: Insufficient documentation

## 2013-02-10 DIAGNOSIS — R41842 Visuospatial deficit: Secondary | ICD-10-CM | POA: Insufficient documentation

## 2013-02-10 DIAGNOSIS — M6281 Muscle weakness (generalized): Secondary | ICD-10-CM | POA: Insufficient documentation

## 2013-02-10 DIAGNOSIS — R262 Difficulty in walking, not elsewhere classified: Secondary | ICD-10-CM | POA: Insufficient documentation

## 2013-02-11 ENCOUNTER — Ambulatory Visit: Payer: BC Managed Care – PPO | Admitting: Occupational Therapy

## 2013-02-11 ENCOUNTER — Ambulatory Visit: Payer: BC Managed Care – PPO | Admitting: Physical Therapy

## 2013-02-13 ENCOUNTER — Ambulatory Visit: Payer: BC Managed Care – PPO

## 2013-02-13 ENCOUNTER — Ambulatory Visit: Payer: BC Managed Care – PPO | Admitting: Occupational Therapy

## 2013-02-17 ENCOUNTER — Ambulatory Visit: Payer: BC Managed Care – PPO | Admitting: Occupational Therapy

## 2013-02-17 ENCOUNTER — Ambulatory Visit: Payer: BC Managed Care – PPO | Admitting: Physical Therapy

## 2013-02-18 ENCOUNTER — Ambulatory Visit: Payer: BC Managed Care – PPO | Admitting: Occupational Therapy

## 2013-02-18 ENCOUNTER — Ambulatory Visit: Payer: BC Managed Care – PPO

## 2013-02-18 ENCOUNTER — Emergency Department (HOSPITAL_COMMUNITY)
Admission: EM | Admit: 2013-02-18 | Discharge: 2013-02-18 | Disposition: A | Discharge status: Home / Self Care | Payer: BC Managed Care – PPO | Attending: Emergency Medicine | Admitting: Emergency Medicine

## 2013-02-18 ENCOUNTER — Emergency Department (HOSPITAL_COMMUNITY): Payer: BC Managed Care – PPO

## 2013-02-18 ENCOUNTER — Ambulatory Visit: Payer: BC Managed Care – PPO | Admitting: Physical Therapy

## 2013-02-18 ENCOUNTER — Encounter (HOSPITAL_COMMUNITY): Payer: Self-pay | Admitting: Nurse Practitioner

## 2013-02-18 DIAGNOSIS — Z8679 Personal history of other diseases of the circulatory system: Secondary | ICD-10-CM | POA: Insufficient documentation

## 2013-02-18 DIAGNOSIS — R29810 Facial weakness: Secondary | ICD-10-CM | POA: Insufficient documentation

## 2013-02-18 DIAGNOSIS — Z87820 Personal history of traumatic brain injury: Secondary | ICD-10-CM | POA: Insufficient documentation

## 2013-02-18 DIAGNOSIS — Z87891 Personal history of nicotine dependence: Secondary | ICD-10-CM | POA: Insufficient documentation

## 2013-02-18 DIAGNOSIS — Z79899 Other long term (current) drug therapy: Secondary | ICD-10-CM | POA: Insufficient documentation

## 2013-02-18 HISTORY — DX: Unspecified intracranial injury with loss of consciousness of unspecified duration, initial encounter: S06.9X9A

## 2013-02-18 HISTORY — DX: Unspecified intracranial injury with loss of consciousness status unknown, initial encounter: S06.9XAA

## 2013-02-18 LAB — GLUCOSE, CAPILLARY: Glucose-Capillary: 79 mg/dL (ref 70–99)

## 2013-02-18 LAB — POCT I-STAT, CHEM 8
BUN: 11 mg/dL (ref 6–23)
BUN: 11 mg/dL (ref 6–23)
Calcium, Ion: 1.29 mmol/L — ABNORMAL HIGH (ref 1.12–1.23)
Calcium, Ion: 1.3 mmol/L — ABNORMAL HIGH (ref 1.12–1.23)
Chloride: 103 mEq/L (ref 96–112)
Chloride: 103 mEq/L (ref 96–112)
Creatinine, Ser: 0.9 mg/dL (ref 0.50–1.35)
Creatinine, Ser: 1 mg/dL (ref 0.50–1.35)
Glucose, Bld: 98 mg/dL (ref 70–99)
Glucose, Bld: 95 mg/dL (ref 70–99)
HCT: 45 % (ref 39.0–52.0)
HCT: 44 % (ref 39.0–52.0)
Hemoglobin: 15.3 g/dL (ref 13.0–17.0)
Hemoglobin: 15 g/dL (ref 13.0–17.0)
Potassium: 4.6 mEq/L (ref 3.5–5.1)
Potassium: 4.5 mEq/L (ref 3.5–5.1)
Sodium: 140 mEq/L (ref 135–145)
Sodium: 140 mEq/L (ref 135–145)
TCO2: 29 mmol/L (ref 0–100)
TCO2: 30 mmol/L (ref 0–100)

## 2013-02-18 NOTE — ED Provider Notes (Signed)
History     CSN: [id number]  Arrival date & time 02/18/13  1309   First MD Initiated Contact with Patient 02/18/13 1347      Chief Complaint  Patient presents with  . Weakness     HPI Pt discharged from [facility] on 5/30 after being admitted for traumatic brain injury in April. Wife states last night pt seemed more weak than he had been and today his lip seemed to be drooping on the right to her.  Past Medical History  Diagnosis Date  . Headache(784.0)   . Traumatic brain injury     Past Surgical History  Procedure Laterality Date  . Craniotomy Left 12/15/2012    Procedure: CRANIECTOMY HEMATOMA EVACUATION SUBDURAL WITH PLACEMENT OF BONE FLAP IN ABDOMINAL WALL;  Surgeon: Karn Cassis, MD;  Location: [facility];  Service: Neurosurgery;  Laterality: Left;  . Craniotomy Left 12/28/2012    Procedure: CRANIOTOMY HEMATOMA EVACUATION SUBDURAL;  Surgeon: Maeola Harman, MD;  Location: [facility];  Service: Neurosurgery;  Laterality: Left;  . Peg placement N/A 12/31/2012    Procedure: PERCUTANEOUS ENDOSCOPIC GASTROSTOMY (PEG) PLACEMENT;  Surgeon: Liz Malady, MD;  Location: [facility] ENDOSCOPY;  Service: General;  Laterality: N/A;  bedside  peg  . Percutaneous tracheostomy N/A 12/31/2012    Procedure: PERCUTANEOUS TRACHEOSTOMY (BEDSIDE);  Surgeon: Liz Malady, MD;  Location: [facility] OR;  Service: General;  Laterality: N/A;    History reviewed. No pertinent family history.  History  Substance Use Topics  . Smoking status: Former Smoker    Types: Cigarettes    Quit date: 12/17/2002  . Smokeless tobacco: Not on file  . Alcohol Use: No      Review of Systems  All other systems reviewed and are negative.    Allergies  Vancomycin and Zosyn  Home Medications   Current Outpatient Rx  Name  Route  Sig  Dispense  Refill  . baclofen (LIORESAL) 10 MG tablet   Oral   Take 1 tablet (10 mg total) by mouth 2 (two) times daily.   60 tablet   1   . levETIRAcetam (KEPPRA) 750 MG  tablet   Oral   Take 2 tablets (1,500 mg total) by mouth 2 (two) times daily.   60 tablet   1   . LORazepam (ATIVAN) 0.5 MG tablet   Oral   Take 1-2 tablets (0.5-1 mg total) by mouth every 4 (four) hours as needed for anxiety.   30 tablet   0   . methylphenidate (RITALIN) 5 MG tablet   Oral   Take 3 tablets (15 mg total) by mouth 2 (two) times daily with breakfast and lunch.   60 tablet   0   . Multiple Vitamin (MULTIVITAMIN WITH MINERALS) TABS   Oral   Take 1 tablet by mouth daily.         . propranolol (INDERAL) 20 MG tablet   Oral   Take 1 tablet (20 mg total) by mouth 2 (two) times daily.   60 tablet   1   . saccharomyces boulardii (FLORASTOR) 250 MG capsule   Oral   Take 1 capsule (250 mg total) by mouth 2 (two) times daily.   60 capsule   0     BP 104/73  Pulse 73  Temp(Src) 97.9 F (36.6 C) (Oral)  Resp 16  SpO2 98%  Physical Exam  Nursing note and vitals reviewed. Constitutional: He is oriented to person, place, and time. He appears well-developed and  well-nourished. No distress.  HENT:  Head: Normocephalic.  Large cranial depression from recent craniotomy on left  Eyes: Pupils are equal, round, and reactive to light.  Neck: Normal range of motion.  Cardiovascular: Normal rate and intact distal pulses.   Pulmonary/Chest: No respiratory distress.  Abdominal: Normal appearance. He exhibits no distension.  Musculoskeletal: Normal range of motion.  Neurological: He is alert and oriented to person, place, and time. No cranial nerve deficit.  Skin: Skin is warm and dry. No rash noted.  Psychiatric: He has a normal mood and affect. His behavior is normal.    ED Course  Procedures (including critical care time)  Labs Reviewed  POCT I-STAT, CHEM 8 - Abnormal; Notable for the following:    Calcium, Ion 1.29 (*)    All other components within normal limits  POCT I-STAT, CHEM 8 - Abnormal; Notable for the following:    Calcium, Ion 1.30 (*)    All  other components within normal limits  GLUCOSE, CAPILLARY   Ct Head Wo Contrast  02/18/2013   *RADIOLOGY REPORT*  Clinical Data: Pain  CT HEAD WITHOUT CONTRAST  Technique:  Contiguous axial images were obtained from the base of the skull through the vertex without contrast.  Comparison: 01/08/2013  Findings: Again noted status post of the left craniotomy.  No intracranial hemorrhage, mass effect or midline shift.  No definite acute cortical infarction.  There is encephalomalacia in the left PCA territory due to prior infarct.  Stable lacunar infarct in the left thalamus.  No mass lesion is noted on this unenhanced scan.  IMPRESSION: Stable postsurgical changes.  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  Stable small lacunar infarct left thalamus.  No definite acute cortical infarction.   Original Report Authenticated By: Natasha Mead, M.D.     1. Facial weakness       MDM         Nelia Shi, MD 02/19/13 2255

## 2013-02-18 NOTE — ED Notes (Signed)
Pt discharged from [facility] on 5/30 after being admitted for traumatic brain injury in April. Wife states last night pt seemed more weak than he had been and today his lip seemed to be drooping on the right to her. Pt states he just feels really tired. Pt is alert now and breathing easily

## 2013-02-19 ENCOUNTER — Telehealth: Payer: Self-pay | Admitting: *Deleted

## 2013-02-19 NOTE — Telephone Encounter (Signed)
FYI We had a call from yesterday on the nurse line from Albert [name] about Albert [name] having a droopy lip.  In searching for for Albert [name] in [place] I see that they went to the ED after the call and he had a CT Scan without change and was discharged-no readmit. Left voicemail on personally identified voicemail acknowledging the call and letting her know we see that he went to the ED which was the appropriate action and we will let you know and to call our office if further needs or questions.

## 2013-02-20 ENCOUNTER — Ambulatory Visit: Payer: BC Managed Care – PPO | Admitting: Occupational Therapy

## 2013-02-20 ENCOUNTER — Ambulatory Visit: Payer: BC Managed Care – PPO | Admitting: Physical Therapy

## 2013-02-20 ENCOUNTER — Ambulatory Visit: Payer: BC Managed Care – PPO

## 2013-02-24 ENCOUNTER — Ambulatory Visit: Payer: BC Managed Care – PPO | Admitting: Speech Pathology

## 2013-02-25 ENCOUNTER — Ambulatory Visit: Payer: BC Managed Care – PPO | Admitting: Physical Therapy

## 2013-02-25 ENCOUNTER — Ambulatory Visit: Payer: BC Managed Care – PPO | Admitting: Occupational Therapy

## 2013-02-26 ENCOUNTER — Ambulatory Visit: Payer: BC Managed Care – PPO

## 2013-02-26 ENCOUNTER — Ambulatory Visit: Payer: BC Managed Care – PPO | Admitting: Physical Therapy

## 2013-02-26 ENCOUNTER — Ambulatory Visit: Payer: BC Managed Care – PPO | Admitting: Occupational Therapy

## 2013-02-27 ENCOUNTER — Ambulatory Visit: Payer: BC Managed Care – PPO | Admitting: Occupational Therapy

## 2013-02-27 ENCOUNTER — Ambulatory Visit: Payer: BC Managed Care – PPO | Admitting: Speech Pathology

## 2013-02-27 ENCOUNTER — Ambulatory Visit: Payer: BC Managed Care – PPO

## 2013-02-28 ENCOUNTER — Encounter: Payer: Self-pay | Admitting: Physical Medicine & Rehabilitation

## 2013-02-28 ENCOUNTER — Encounter
Payer: BC Managed Care – PPO | Attending: Physical Medicine & Rehabilitation | Admitting: Physical Medicine & Rehabilitation

## 2013-02-28 VITALS — BP 117/70 | HR 77 | Resp 16 | Ht 73.0 in | Wt 176.0 lb

## 2013-02-28 DIAGNOSIS — R5381 Other malaise: Secondary | ICD-10-CM | POA: Insufficient documentation

## 2013-02-28 DIAGNOSIS — S069X9S Unspecified intracranial injury with loss of consciousness of unspecified duration, sequela: Secondary | ICD-10-CM | POA: Insufficient documentation

## 2013-02-28 DIAGNOSIS — S066X1D Traumatic subarachnoid hemorrhage with loss of consciousness of 30 minutes or less, subsequent encounter: Secondary | ICD-10-CM

## 2013-02-28 DIAGNOSIS — N5089 Other specified disorders of the male genital organs: Secondary | ICD-10-CM | POA: Insufficient documentation

## 2013-02-28 DIAGNOSIS — S46001A Unspecified injury of muscle(s) and tendon(s) of the rotator cuff of right shoulder, initial encounter: Secondary | ICD-10-CM

## 2013-02-28 DIAGNOSIS — S069XAS Unspecified intracranial injury with loss of consciousness status unknown, sequela: Secondary | ICD-10-CM | POA: Insufficient documentation

## 2013-02-28 DIAGNOSIS — Z5189 Encounter for other specified aftercare: Secondary | ICD-10-CM

## 2013-02-28 DIAGNOSIS — S4980XA Other specified injuries of shoulder and upper arm, unspecified arm, initial encounter: Secondary | ICD-10-CM

## 2013-02-28 DIAGNOSIS — R4189 Other symptoms and signs involving cognitive functions and awareness: Secondary | ICD-10-CM | POA: Insufficient documentation

## 2013-02-28 DIAGNOSIS — S069X0D Unspecified intracranial injury without loss of consciousness, subsequent encounter: Secondary | ICD-10-CM

## 2013-02-28 DIAGNOSIS — R561 Post traumatic seizures: Secondary | ICD-10-CM | POA: Insufficient documentation

## 2013-02-28 DIAGNOSIS — H538 Other visual disturbances: Secondary | ICD-10-CM | POA: Insufficient documentation

## 2013-02-28 DIAGNOSIS — R42 Dizziness and giddiness: Secondary | ICD-10-CM | POA: Insufficient documentation

## 2013-02-28 DIAGNOSIS — N509 Disorder of male genital organs, unspecified: Secondary | ICD-10-CM

## 2013-02-28 DIAGNOSIS — X58XXXA Exposure to other specified factors, initial encounter: Secondary | ICD-10-CM | POA: Insufficient documentation

## 2013-02-28 MED ORDER — MELOXICAM 7.5 MG PO TABS: 7.5000 mg | ORAL_TABLET | Freq: Every day | ORAL | Status: DC

## 2013-02-28 MED ORDER — METHYLPHENIDATE HCL 20 MG PO TABS: 20.0000 mg | ORAL_TABLET | Freq: Two times a day (BID) | ORAL | Status: DC

## 2013-02-28 NOTE — Patient Instructions (Addendum)
DECREASE INDERAL TO 20MG  ONCE DAILY FOR ONE WEEK THEN STOP.

## 2013-02-28 NOTE — Progress Notes (Signed)
Subjective:    Patient ID: Albert [name], male    DOB: Aug 13, 1976, 37 y.o.   MRN: [id number]  HPI  Albert [name] is back regarding his TBI. He has been home for about 3 weeks. His wife states that he experienced some right facial droop a week or two again for which he went to the ED. I reviewed the head CT which was negative.  He struggles at times with his patience with 6 yo daughter. He hasn't been physically aggressive in any way. He sometimes will yell at her and sometimes more stern with her as well. Sometime his mother will bring this out of him as well. Usually, however, he will become more quiet when he is tire or has had "enough."  From a therapy standpoint he's working with outpt therapies--PT, OT, SLP. Unfortunately, he has limitations with the amount of visits which are far less than the amount he needs given his numerous problems.   Vision continues to be a problem. He complains of blurred vision and loss of peripheral fields. It sounds as if it has improved  Dizziness is an issue, often after the baclofen.  He is only using the baclofen twice daily. The baclofen does help with spasms however.   Fatigue remains a problem. He continues on ritalin.   Seizure prophylaxis with keppra remains.   His right shoulder is a little tender. He does have an old right shoulder injury.      Pain Inventory Average Pain 0 Pain Right Now 0 My pain is intermittent  In the last 24 hours, has pain interfered with the following? General activity 2 Relation with others 2 Enjoyment of life 2 What TIME of day is your pain at its worst? morning,night time Sleep (in general) Good  Pain is worse with: walking, inactivity and some activites Pain improves with: rest and medication Relief from Meds: 9  Mobility walk without assistance walk with assistance how many minutes can you walk? 5 ability to climb steps?  yes do you drive?  no transfers alone  Function employed # of hrs/week 40 I  need assistance with the following:  dressing and bathing Do you have any goals in this area?  yes  Neuro/Psych weakness numbness trouble walking dizziness confusion  Prior Studies Any changes since last visit?  no  Physicians involved in your care Any changes since last visit?  no   History reviewed. No pertinent family history. History   Social History  . Marital Status: Married    Spouse Name: N/A    Number of Children: N/A  . Years of Education: N/A   Social History Main Topics  . Smoking status: Former Smoker    Types: Cigarettes    Quit date: 12/17/2002  . Smokeless tobacco: None  . Alcohol Use: No  . Drug Use: No  . Sexually Active: None   Other Topics Concern  . None   Social History Narrative  . None   Past Surgical History  Procedure Laterality Date  . Craniotomy Left 12/15/2012    Procedure: CRANIECTOMY HEMATOMA EVACUATION SUBDURAL WITH PLACEMENT OF BONE FLAP IN ABDOMINAL WALL;  Surgeon: Karn Cassis, MD;  Location: [facility];  Service: Neurosurgery;  Laterality: Left;  . Craniotomy Left 12/28/2012    Procedure: CRANIOTOMY HEMATOMA EVACUATION SUBDURAL;  Surgeon: Maeola Harman, MD;  Location: [facility];  Service: Neurosurgery;  Laterality: Left;  . Peg placement N/A 12/31/2012    Procedure: PERCUTANEOUS ENDOSCOPIC GASTROSTOMY (PEG) PLACEMENT;  Surgeon:  Liz Malady, MD;  Location: [facility];  Service: General;  Laterality: N/A;  bedside  peg  . Percutaneous tracheostomy N/A 12/31/2012    Procedure: PERCUTANEOUS TRACHEOSTOMY (BEDSIDE);  Surgeon: Liz Malady, MD;  Location: [facility] OR;  Service: General;  Laterality: N/A;   Past Medical History  Diagnosis Date  . Headache(784.0)   . Traumatic brain injury    BP 117/70  Pulse 77  Resp 16  Ht 6\' 1"  (1.854 m)  Wt 176 lb (79.833 kg)  BMI 23.23 kg/m2  SpO2 97%     Review of Systems  Constitutional: Positive for unexpected weight change.  Musculoskeletal: Positive for gait problem.   Neurological: Positive for dizziness, weakness and numbness.  Psychiatric/Behavioral: Positive for confusion.  All other systems reviewed and are negative.       Objective:   Physical Exam  General: Alert and oriented x 3, No apparent distress. Wearing helmet.  HEENT: Head is normocephalic, atraumatic, PERRLA, EOMI, sclera anicteric, oral mucosa pink and moist, dentition intact, ext ear canals clear,  Neck: Supple without JVD or lymphadenopathy Heart: Reg rate and rhythm. No murmurs rubs or gallops Chest: CTA bilaterally without wheezes, rales, or rhonchi; no distress Abdomen: Soft, non-tender, non-distended, bowel sounds positive. Extremities: No clubbing, cyanosis, or edema. Pulses are 2+ Skin: Clean and intact without signs of breakdown Neuro: Pt recalled the month, day of week, year. Told me the basics of why he was here. Recalled by angry with daughter at times. No agitated behavior seen. Poor attention and focus still. Right HH, no central deficits appreciated. Still apraxic with the right side. Mild resting tone RUE, RLE tr/4. Underlying motor 4/5 on the right. 5/5 on the left. Walks favoring the right side, often dragging the right foot during gait a bit. DTR's 3+ on right.  Musculoskeletal: Right shoulder tender with IR, ER, and RTC impingement maneuvers.  Psych: Pt's affect is appropriate. Pt is cooperative GU: scrotum is red with dried flaky skin noted as well.         Assessment & Plan:  Assessment: 1. 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 Status post craniotomy evacuation of hematoma. He has made substantial progress but still has significant cognitive deficits which affect memory, attention, focus, higher level cognition 2. Old right shoulder injury, likely RTC tendonitis 3. Seizure post- traumatic 4. Scrotal swelling, pain----?dermatitis   Plan: 1. Will make a referral to neuro-optho, Dr. Aura Camps,  for  evaluation of his right hemi-anopsia and general visual deficits 2. Asked to have OT contact me regarding his shoulder. Would benefit from RTC exercises. Started mobic 7.5mg  for now, daily 3. Continue with ritalin but increase to 20mg  bid to improve attention, energy levels. Consider hormonal screen soon. 4. Continue with keppra for seizure proph 5. Wean off inderal given reports of fatigue. I don't know that he needs it any longer. 6. Stop florastor. Recommended use of eucerin cream for now to help with scrotal dryness. Apparently, he was placed on klonopin before for scrotal dysesthesias----I'm not sure why this medication would have been used.  7. Follow up with me next month. One hour of face to face patient care time was spent during this visit. All questions were encouraged and answered.

## 2013-03-03 ENCOUNTER — Telehealth: Payer: Self-pay | Admitting: *Deleted

## 2013-03-03 NOTE — Telephone Encounter (Signed)
The CPT codes for his therapy need to be acquired through his therapists at neuro rehab. i don't know these

## 2013-03-03 NOTE — Telephone Encounter (Signed)
Mrs Spradley spoke with [employer] about extending rehab,  They will need the CPT codes to research for approval

## 2013-03-04 ENCOUNTER — Telehealth: Payer: Self-pay

## 2013-03-04 ENCOUNTER — Ambulatory Visit: Payer: BC Managed Care – PPO | Admitting: Speech Pathology

## 2013-03-04 ENCOUNTER — Ambulatory Visit: Payer: BC Managed Care – PPO | Admitting: Physical Therapy

## 2013-03-04 NOTE — Telephone Encounter (Signed)
Patient wife called to get diagnosis codes for patient to hopefully get more therapy approved by insurance.  Codes given to wife.  She is also concerned about him having anxiety problems.  She wanted to know if it was ok to give the ativan.  Advised this was fine.

## 2013-03-05 ENCOUNTER — Encounter: Payer: BC Managed Care – PPO | Admitting: Occupational Therapy

## 2013-03-05 ENCOUNTER — Ambulatory Visit: Payer: BC Managed Care – PPO

## 2013-03-05 NOTE — Telephone Encounter (Signed)
Albert [name] given this information.

## 2013-03-06 ENCOUNTER — Ambulatory Visit: Payer: BC Managed Care – PPO

## 2013-03-06 ENCOUNTER — Ambulatory Visit: Payer: BC Managed Care – PPO | Admitting: Occupational Therapy

## 2013-03-10 ENCOUNTER — Ambulatory Visit: Payer: BC Managed Care – PPO | Admitting: Occupational Therapy

## 2013-03-10 ENCOUNTER — Ambulatory Visit: Payer: BC Managed Care – PPO | Admitting: Speech Pathology

## 2013-03-10 ENCOUNTER — Ambulatory Visit: Payer: BC Managed Care – PPO | Admitting: Physical Therapy

## 2013-03-12 ENCOUNTER — Ambulatory Visit: Payer: BC Managed Care – PPO | Admitting: Physical Therapy

## 2013-03-12 ENCOUNTER — Ambulatory Visit: Payer: BC Managed Care – PPO | Attending: Physical Medicine & Rehabilitation | Admitting: Speech Pathology

## 2013-03-12 ENCOUNTER — Ambulatory Visit: Payer: BC Managed Care – PPO | Admitting: Occupational Therapy

## 2013-03-12 DIAGNOSIS — Z5189 Encounter for other specified aftercare: Secondary | ICD-10-CM | POA: Insufficient documentation

## 2013-03-12 DIAGNOSIS — M629 Disorder of muscle, unspecified: Secondary | ICD-10-CM | POA: Insufficient documentation

## 2013-03-12 DIAGNOSIS — R4189 Other symptoms and signs involving cognitive functions and awareness: Secondary | ICD-10-CM | POA: Insufficient documentation

## 2013-03-12 DIAGNOSIS — R4701 Aphasia: Secondary | ICD-10-CM | POA: Insufficient documentation

## 2013-03-12 DIAGNOSIS — R41842 Visuospatial deficit: Secondary | ICD-10-CM | POA: Insufficient documentation

## 2013-03-12 DIAGNOSIS — R262 Difficulty in walking, not elsewhere classified: Secondary | ICD-10-CM | POA: Insufficient documentation

## 2013-03-12 DIAGNOSIS — M2420 Disorder of ligament, unspecified site: Secondary | ICD-10-CM | POA: Insufficient documentation

## 2013-03-12 DIAGNOSIS — M6281 Muscle weakness (generalized): Secondary | ICD-10-CM | POA: Insufficient documentation

## 2013-03-12 DIAGNOSIS — R279 Unspecified lack of coordination: Secondary | ICD-10-CM | POA: Insufficient documentation

## 2013-03-13 ENCOUNTER — Ambulatory Visit: Payer: BC Managed Care – PPO | Admitting: Physical Therapy

## 2013-03-13 ENCOUNTER — Encounter: Payer: BC Managed Care – PPO | Admitting: Occupational Therapy

## 2013-03-17 ENCOUNTER — Ambulatory Visit: Payer: BC Managed Care – PPO | Admitting: Occupational Therapy

## 2013-03-17 ENCOUNTER — Ambulatory Visit: Payer: BC Managed Care – PPO | Admitting: Physical Therapy

## 2013-03-17 ENCOUNTER — Ambulatory Visit: Payer: BC Managed Care – PPO | Admitting: Speech Pathology

## 2013-03-19 ENCOUNTER — Encounter: Payer: BC Managed Care – PPO | Admitting: Occupational Therapy

## 2013-03-19 ENCOUNTER — Encounter: Payer: BC Managed Care – PPO | Admitting: Speech Pathology

## 2013-03-19 ENCOUNTER — Ambulatory Visit: Payer: BC Managed Care – PPO | Admitting: Physical Therapy

## 2013-03-19 ENCOUNTER — Ambulatory Visit: Payer: BC Managed Care – PPO

## 2013-03-19 ENCOUNTER — Ambulatory Visit: Payer: BC Managed Care – PPO | Admitting: Occupational Therapy

## 2013-03-20 ENCOUNTER — Encounter: Payer: BC Managed Care – PPO | Admitting: Occupational Therapy

## 2013-03-20 ENCOUNTER — Ambulatory Visit: Payer: BC Managed Care – PPO | Admitting: Physical Therapy

## 2013-03-24 ENCOUNTER — Ambulatory Visit: Payer: BC Managed Care – PPO

## 2013-03-24 ENCOUNTER — Ambulatory Visit: Payer: BC Managed Care – PPO | Admitting: Speech Pathology

## 2013-03-24 ENCOUNTER — Ambulatory Visit: Payer: BC Managed Care – PPO | Admitting: Occupational Therapy

## 2013-03-26 ENCOUNTER — Ambulatory Visit: Payer: BC Managed Care – PPO

## 2013-03-26 ENCOUNTER — Ambulatory Visit: Payer: BC Managed Care – PPO | Admitting: Occupational Therapy

## 2013-03-27 ENCOUNTER — Ambulatory Visit: Payer: BC Managed Care – PPO

## 2013-03-27 ENCOUNTER — Encounter: Payer: BC Managed Care – PPO | Admitting: Occupational Therapy

## 2013-03-31 ENCOUNTER — Ambulatory Visit: Payer: BC Managed Care – PPO

## 2013-03-31 ENCOUNTER — Ambulatory Visit: Payer: BC Managed Care – PPO | Admitting: Speech Pathology

## 2013-03-31 ENCOUNTER — Ambulatory Visit: Payer: BC Managed Care – PPO | Admitting: Occupational Therapy

## 2013-04-01 ENCOUNTER — Other Ambulatory Visit: Payer: Self-pay | Admitting: Neurosurgery

## 2013-04-01 ENCOUNTER — Encounter (HOSPITAL_COMMUNITY): Payer: Self-pay | Admitting: Pharmacy Technician

## 2013-04-02 ENCOUNTER — Ambulatory Visit: Payer: BC Managed Care – PPO

## 2013-04-02 ENCOUNTER — Ambulatory Visit: Payer: BC Managed Care – PPO | Admitting: Speech Pathology

## 2013-04-02 ENCOUNTER — Ambulatory Visit: Payer: BC Managed Care – PPO | Admitting: Occupational Therapy

## 2013-04-03 ENCOUNTER — Ambulatory Visit: Payer: BC Managed Care – PPO

## 2013-04-03 ENCOUNTER — Encounter: Payer: BC Managed Care – PPO | Admitting: Occupational Therapy

## 2013-04-04 ENCOUNTER — Encounter
Payer: BC Managed Care – PPO | Attending: Physical Medicine & Rehabilitation | Admitting: Physical Medicine & Rehabilitation

## 2013-04-04 ENCOUNTER — Encounter: Payer: Self-pay | Admitting: Physical Medicine & Rehabilitation

## 2013-04-04 VITALS — BP 147/84 | HR 82 | Resp 16 | Wt 177.0 lb

## 2013-04-04 DIAGNOSIS — X58XXXA Exposure to other specified factors, initial encounter: Secondary | ICD-10-CM | POA: Insufficient documentation

## 2013-04-04 DIAGNOSIS — S066X1D Traumatic subarachnoid hemorrhage with loss of consciousness of 30 minutes or less, subsequent encounter: Secondary | ICD-10-CM

## 2013-04-04 DIAGNOSIS — Z5189 Encounter for other specified aftercare: Secondary | ICD-10-CM

## 2013-04-04 DIAGNOSIS — S069X0D Unspecified intracranial injury without loss of consciousness, subsequent encounter: Secondary | ICD-10-CM

## 2013-04-04 DIAGNOSIS — S065X9D Traumatic subdural hemorrhage with loss of consciousness of unspecified duration, subsequent encounter: Secondary | ICD-10-CM

## 2013-04-04 DIAGNOSIS — N5089 Other specified disorders of the male genital organs: Secondary | ICD-10-CM

## 2013-04-04 DIAGNOSIS — S46001D Unspecified injury of muscle(s) and tendon(s) of the rotator cuff of right shoulder, subsequent encounter: Secondary | ICD-10-CM

## 2013-04-04 DIAGNOSIS — N509 Disorder of male genital organs, unspecified: Secondary | ICD-10-CM

## 2013-04-04 MED ORDER — LEVETIRACETAM 750 MG PO TABS: 1500.0000 mg | ORAL_TABLET | Freq: Two times a day (BID) | ORAL | Status: DC

## 2013-04-04 MED ORDER — METHYLPHENIDATE HCL 20 MG PO TABS: 20.0000 mg | ORAL_TABLET | Freq: Two times a day (BID) | ORAL | Status: DC

## 2013-04-04 NOTE — Progress Notes (Signed)
Subjective:    Patient ID: Albert [name], male    DOB: [birth year], 37 y.o.   MRN: [id number]  HPI  Albert [name] is back regarding his severe TBI. He saw Dr. Karleen Hampshire who apparently wants to wait for further recovery but probably doesn't have much to offer other than perhaps prism lenses.   At home, things are going fairly well. He complains of feeling sleepy a lot of time. There have been no behavioral problems per wife. No aggressive or impulsive behavior is noted. He's getting along with his daughter better and is "less strict."  Albert [name] does complain of being sleepy all the time. He doesn't have the energy to do much or he "doesn't feel well." he is sleeping regularly each night. He is not getting a day time nap consistently during the day.  He is running out of therapy visits according to therapy and his wife. He will out of covered visits next week I believe. Therapy has much to work on still unfortunately.        Pain Inventory Average Pain 0 Pain Right Now 0 My pain is na  In the last 24 hours, has pain interfered with the following? General activity na Relation with others na Enjoyment of life na What TIME of day is your pain at its worst? constant Sleep (in general) Good  Pain is worse with: some activites Pain improves with: na Relief from Meds: 7  Mobility walk without assistance Do you have any goals in this area?  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  yes  Neuro/Psych weakness numbness dizziness confusion anxiety  Prior Studies Any changes since last visit?  no  Physicians involved in your care Any changes since last visit?  no   History reviewed. No pertinent family history. History   Social History  . Marital Status: Married    Spouse Name: N/A    Number of  Children: N/A  . Years of Education: N/A   Social History Main Topics  . Smoking status: Former Smoker    Types: Cigarettes    Quit date: 12/17/2002  . Smokeless tobacco: None  . Alcohol Use: No  . Drug Use: No  . Sexually Active: None   Other Topics Concern  . None   Social History Narrative  . None   Past Surgical History  Procedure Laterality Date  . Craniotomy Left 12/15/2012    Procedure: CRANIECTOMY HEMATOMA EVACUATION SUBDURAL WITH PLACEMENT OF BONE FLAP IN ABDOMINAL WALL;  Surgeon: Karn Cassis, MD;  Location: [facility];  Service: Neurosurgery;  Laterality: Left;  . Craniotomy Left 12/28/2012    Procedure: CRANIOTOMY HEMATOMA EVACUATION SUBDURAL;  Surgeon: Maeola Harman, MD;  Location: [facility];  Service: Neurosurgery;  Laterality: Left;  . Peg placement N/A 12/31/2012    Procedure: PERCUTANEOUS ENDOSCOPIC GASTROSTOMY (PEG) PLACEMENT;  Surgeon: Liz Malady, MD;  Location: [facility];  Service: General;  Laterality: N/A;  bedside  peg  . Percutaneous tracheostomy N/A 12/31/2012    Procedure: PERCUTANEOUS TRACHEOSTOMY (BEDSIDE);  Surgeon: Liz Malady, MD;  Location: [facility] OR;  Service: General;  Laterality: N/A;   Past Medical History  Diagnosis Date  . Headache(784.0)   . Traumatic brain injury    BP 147/84  Pulse 82  Resp 16  Wt 177 lb (  80.287 kg)  BMI 23.36 kg/m2  SpO2 97%     Review of Systems  Neurological: Positive for dizziness, weakness and numbness.  Psychiatric/Behavioral: Positive for confusion and agitation.  All other systems reviewed and are negative.       Objective:   Physical Exam General: Alert and oriented x 3, No apparent distress. Wearing helmet.  HEENT: Head is normocephalic, atraumatic, PERRLA, EOMI, sclera anicteric, oral mucosa pink and moist, dentition intact, ext ear canals clear,  Neck: Supple without JVD or lymphadenopathy  Heart: Reg rate and rhythm. No murmurs rubs or gallops  Chest: CTA bilaterally without  wheezes, rales, or rhonchi; no distress  Abdomen: Soft, non-tender, non-distended, bowel sounds positive.  Extremities: No clubbing, cyanosis, or edema. Pulses are 2+  Skin: Clean and intact without signs of breakdown  Neuro: Pt recalled the month, day (almost), year. Told me the basics of why he was here. No agitated behavior seen. Poor attention and focus still, delayed processing . Right HH, no central deficits appreciated. Still apraxic with the right side. Mild resting tone RUE, RLE tr/4. Underlying motor 4/5 on the right. 5/5 on the left. Walks favoring the right side, still drags the right foot during gait a bit. DTR's 3+ on right.  Musculoskeletal: Right shoulder tender with IR, ER, and RTC impingement maneuvers.  Psych: Pt's affect is appropriate. Pt is cooperative GU: scrotum is red with dried flaky skin noted as well.   Assessment & Plan:   Assessment:  1. 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 Status post craniotomy evacuation of hematoma. He has made substantial progress but still has significant cognitive deficits which affect memory, attention, focus, higher level cognition  2. Old right shoulder injury, likely RTC tendonitis  3. Seizure post- traumatic  4. Scrotal swelling, pain----?dermatitis   Plan:  1. After informed consent and preparation of the skin with betadine and isopropyl alcohol, I injected 40mg  of methylprednisolone and 4cc of 1% lidocaine into the right subacromial space via lateral approach. The patient tolerated well, and no complications were encountered. Afterward the area was cleaned and dressed. Post- injection instructions were provided.    2. Ordered an MRI of the right shoulder to assess for RTC injury 3. Continue with ritalin for attention, awareness 4. Continue with keppra for seizure proph but decrease to 1500mg  QPM and 750MG  QAM to decrease fatigue 5. Dc baclofen due to SE 6.  Recommended use of eucerin cream  for now to help with scrotal dryness. Apparently, he was placed on klonopin before for scrotal dysesthesias----I'm not sure why this medication would have been used.  7. Will make a referral to Dr. Leonides Cave to assist with behavior, coping strategies, interpretation of dreams, etc.   8. Follow up with me in one month. 45+ minutes of face to face patient care time was spent during this visit. Numerous topics were reviewed. I will also see what we can do in regard to insurance coverage of further therapy treatments. He needs MUCH more outpt therapy to address his cognition and adaptive skills, right shoulder pain/movement/RTC mgt, gait, etc. He goes for his cranioplasty next month.

## 2013-04-04 NOTE — Patient Instructions (Signed)
YOU NEED A NAP EVERY DAY AROUND 12 TO 1PM. YOU SHOULD TRY TO REST AROUND ONE HOUR.  TRY TO MAKE THIS A ROUTINE!!!!!   REDUCE KEPPRA TO 750MG  IN AM AND 1500MG  IN PM

## 2013-04-09 ENCOUNTER — Encounter (HOSPITAL_COMMUNITY)
Admission: RE | Admit: 2013-04-09 | Discharge: 2013-04-09 | Disposition: A | Discharge status: Home / Self Care | Payer: BC Managed Care – PPO | Source: Ambulatory Visit | Attending: Neurosurgery | Admitting: Neurosurgery

## 2013-04-09 ENCOUNTER — Ambulatory Visit: Payer: BC Managed Care – PPO

## 2013-04-09 ENCOUNTER — Encounter (HOSPITAL_COMMUNITY): Payer: Self-pay

## 2013-04-09 DIAGNOSIS — Z01812 Encounter for preprocedural laboratory examination: Secondary | ICD-10-CM | POA: Insufficient documentation

## 2013-04-09 HISTORY — DX: Unspecified injury of muscle(s) and tendon(s) of the rotator cuff of unspecified shoulder, initial encounter: S46.009A

## 2013-04-09 HISTORY — DX: Unspecified convulsions: R56.9

## 2013-04-09 LAB — SURGICAL PCR SCREEN
MRSA, PCR: POSITIVE — AB
Staphylococcus aureus: POSITIVE — AB

## 2013-04-09 LAB — CBC
HCT: 42.8 % (ref 39.0–52.0)
Hemoglobin: 15.2 g/dL (ref 13.0–17.0)
MCH: 28.9 pg (ref 26.0–34.0)
MCHC: 35.5 g/dL (ref 30.0–36.0)
MCV: 81.4 fL (ref 78.0–100.0)
Platelets: 249 10*3/uL (ref 150–400)
RBC: 5.26 MIL/uL (ref 4.22–5.81)
RDW: 12.7 % (ref 11.5–15.5)
WBC: 6.4 10*3/uL (ref 4.0–10.5)

## 2013-04-09 LAB — BASIC METABOLIC PANEL
BUN: 18 mg/dL (ref 6–23)
CO2: 24 mEq/L (ref 19–32)
Calcium: 9.7 mg/dL (ref 8.4–10.5)
Chloride: 104 mEq/L (ref 96–112)
Creatinine, Ser: 0.7 mg/dL (ref 0.50–1.35)
GFR calc Af Amer: >90 mL/min (ref >90)
GFR calc non Af Amer: >90 mL/min (ref >90)
Glucose, Bld: 119 mg/dL — ABNORMAL HIGH (ref 70–99)
Potassium: 3.8 mEq/L (ref 3.5–5.1)
Sodium: 139 mEq/L (ref 135–145)

## 2013-04-09 NOTE — Pre-Procedure Instructions (Signed)
[name]  04/09/2013   Your procedure is scheduled on:  Friday, August 1 ,2014  Report to [facility] at 8:30 AM.  Call this number if you have problems the morning of surgery: [phone]   Remember:   Do not eat food or drink liquids after midnight.   Take these medicines the morning of surgery with A SIP OF WATER: tylenol,keppra, ativan, ritalin   Do not wear jewelry, make-up or nail polish.  Do not wear lotions, powders, or perfumes. You may wear deodorant.  Do not shave 48 hours prior to surgery. Men may shave face and neck.  Do not bring valuables to the hospital.  [facility] is not responsible                   for any belongings or valuables.  Contacts, dentures or bridgework may not be worn into surgery.  Leave suitcase in the car. After surgery it may be brought to your room.  For patients admitted to the hospital, checkout time is 11:00 AM the day of  discharge.   Patients discharged the day of surgery will not be allowed to drive  home.  Name and phone number of your driver:   Special Instructions: Shower using CHG 2 nights before surgery and the night before surgery.  If you shower the day of surgery use CHG.  Use special wash - you have one bottle of CHG for all showers.  You should use approximately 1/3 of the bottle for each shower.   Please read over the following fact sheets that you were given: Pain Booklet, Coughing and Deep Breathing and Surgical Site Infection Prevention

## 2013-04-10 ENCOUNTER — Ambulatory Visit: Payer: BC Managed Care – PPO | Admitting: Occupational Therapy

## 2013-04-10 MED ORDER — CLINDAMYCIN PHOSPHATE 300 MG/50ML IV SOLN
300.0000 mg | Freq: Once | INTRAVENOUS | Status: DC
  Filled 2013-04-10: qty 50

## 2013-04-11 ENCOUNTER — Ambulatory Visit (HOSPITAL_COMMUNITY): Admission: RE | Admit: 2013-04-11 | Payer: BC Managed Care – PPO | Source: Ambulatory Visit | Admitting: Neurosurgery

## 2013-04-11 ENCOUNTER — Encounter (HOSPITAL_COMMUNITY): Admission: RE | Payer: Self-pay | Source: Ambulatory Visit

## 2013-04-11 SURGERY — CRANIOPLASTY
Anesthesia: General

## 2013-04-15 ENCOUNTER — Other Ambulatory Visit (HOSPITAL_COMMUNITY): Payer: BC Managed Care – PPO

## 2013-04-17 DIAGNOSIS — S069X5A Unspecified intracranial injury with loss of consciousness greater than 24 hours with return to pre-existing conscious level, initial encounter: Secondary | ICD-10-CM

## 2013-04-17 DIAGNOSIS — R4189 Other symptoms and signs involving cognitive functions and awareness: Secondary | ICD-10-CM

## 2013-04-17 DIAGNOSIS — F4323 Adjustment disorder with mixed anxiety and depressed mood: Secondary | ICD-10-CM

## 2013-04-18 ENCOUNTER — Telehealth: Payer: Self-pay

## 2013-04-18 NOTE — Telephone Encounter (Signed)
If it gets too bad,over the weekend he should go to the ED

## 2013-04-18 NOTE — Telephone Encounter (Signed)
Patient is having headaches, fatigue and would like to know what to do?

## 2013-04-21 ENCOUNTER — Ambulatory Visit (HOSPITAL_COMMUNITY): Payer: BC Managed Care – PPO

## 2013-04-21 NOTE — Telephone Encounter (Signed)
Tried to contact patient.  Unable to leave a message.

## 2013-04-22 ENCOUNTER — Ambulatory Visit: Payer: BC Managed Care – PPO | Attending: Physical Medicine & Rehabilitation

## 2013-04-22 DIAGNOSIS — M629 Disorder of muscle, unspecified: Secondary | ICD-10-CM | POA: Insufficient documentation

## 2013-04-22 DIAGNOSIS — Z5189 Encounter for other specified aftercare: Secondary | ICD-10-CM | POA: Insufficient documentation

## 2013-04-22 DIAGNOSIS — R279 Unspecified lack of coordination: Secondary | ICD-10-CM | POA: Insufficient documentation

## 2013-04-22 DIAGNOSIS — M2420 Disorder of ligament, unspecified site: Secondary | ICD-10-CM | POA: Insufficient documentation

## 2013-04-22 DIAGNOSIS — R262 Difficulty in walking, not elsewhere classified: Secondary | ICD-10-CM | POA: Insufficient documentation

## 2013-04-22 DIAGNOSIS — R4701 Aphasia: Secondary | ICD-10-CM | POA: Insufficient documentation

## 2013-04-22 DIAGNOSIS — R4189 Other symptoms and signs involving cognitive functions and awareness: Secondary | ICD-10-CM | POA: Insufficient documentation

## 2013-04-22 DIAGNOSIS — M6281 Muscle weakness (generalized): Secondary | ICD-10-CM | POA: Insufficient documentation

## 2013-04-22 DIAGNOSIS — R41842 Visuospatial deficit: Secondary | ICD-10-CM | POA: Insufficient documentation

## 2013-04-22 NOTE — Telephone Encounter (Signed)
Left message for Ms Spadafore to call office regarding patients headaches and fatigue.

## 2013-04-24 ENCOUNTER — Ambulatory Visit (HOSPITAL_COMMUNITY)
Admission: RE | Admit: 2013-04-24 | Discharge: 2013-04-24 | Disposition: A | Discharge status: Home / Self Care | Payer: BC Managed Care – PPO | Source: Ambulatory Visit | Attending: Physical Medicine & Rehabilitation | Admitting: Physical Medicine & Rehabilitation

## 2013-04-24 ENCOUNTER — Ambulatory Visit: Payer: BC Managed Care – PPO

## 2013-04-24 DIAGNOSIS — M25519 Pain in unspecified shoulder: Secondary | ICD-10-CM | POA: Insufficient documentation

## 2013-04-24 DIAGNOSIS — M719 Bursopathy, unspecified: Secondary | ICD-10-CM | POA: Insufficient documentation

## 2013-04-24 DIAGNOSIS — S46001D Unspecified injury of muscle(s) and tendon(s) of the rotator cuff of right shoulder, subsequent encounter: Secondary | ICD-10-CM

## 2013-04-24 DIAGNOSIS — M67919 Unspecified disorder of synovium and tendon, unspecified shoulder: Secondary | ICD-10-CM | POA: Insufficient documentation

## 2013-04-25 ENCOUNTER — Telehealth: Payer: Self-pay | Admitting: Physical Medicine & Rehabilitation

## 2013-04-25 NOTE — Telephone Encounter (Signed)
Please let Mrs. Losito know that there is rotator cuff tendonitis, but no tear. Continue ROM and therapies for shoulder. We can repeat injection if needed as well. thanks

## 2013-04-25 NOTE — Telephone Encounter (Signed)
Notified Mrs Calica.

## 2013-04-29 ENCOUNTER — Ambulatory Visit: Payer: BC Managed Care – PPO

## 2013-04-30 ENCOUNTER — Encounter: Payer: Self-pay | Admitting: Internal Medicine

## 2013-04-30 ENCOUNTER — Ambulatory Visit (INDEPENDENT_AMBULATORY_CARE_PROVIDER_SITE_OTHER): Payer: BC Managed Care – PPO | Admitting: Internal Medicine

## 2013-04-30 VITALS — BP 138/88 | HR 105 | Temp 98.3°F | Wt 175.0 lb

## 2013-04-30 DIAGNOSIS — Z9889 Other specified postprocedural states: Secondary | ICD-10-CM | POA: Insufficient documentation

## 2013-04-30 MED ORDER — CHLORHEXIDINE GLUCONATE 2 % EX LIQD: CUTANEOUS | Status: DC

## 2013-04-30 MED ORDER — MUPIROCIN 2 % EX OINT: TOPICAL_OINTMENT | CUTANEOUS | Status: DC
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
## 2013-05-01 ENCOUNTER — Ambulatory Visit: Payer: BC Managed Care – PPO

## 2013-05-01 DIAGNOSIS — F09 Unspecified mental disorder due to known physiological condition: Secondary | ICD-10-CM

## 2013-05-01 DIAGNOSIS — S069X5A Unspecified intracranial injury with loss of consciousness greater than 24 hours with return to pre-existing conscious level, initial encounter: Secondary | ICD-10-CM

## 2013-05-01 DIAGNOSIS — F4323 Adjustment disorder with mixed anxiety and depressed mood: Secondary | ICD-10-CM

## 2013-05-02 ENCOUNTER — Encounter
Payer: BC Managed Care – PPO | Attending: Physical Medicine & Rehabilitation | Admitting: Physical Medicine & Rehabilitation

## 2013-05-02 ENCOUNTER — Encounter: Payer: Self-pay | Admitting: Physical Medicine & Rehabilitation

## 2013-05-02 VITALS — BP 122/84 | HR 76 | Resp 14 | Ht 72.0 in | Wt 175.0 lb

## 2013-05-02 DIAGNOSIS — S46001D Unspecified injury of muscle(s) and tendon(s) of the rotator cuff of right shoulder, subsequent encounter: Secondary | ICD-10-CM

## 2013-05-02 DIAGNOSIS — S066X1D Traumatic subarachnoid hemorrhage with loss of consciousness of 30 minutes or less, subsequent encounter: Secondary | ICD-10-CM

## 2013-05-02 DIAGNOSIS — S069X0D Unspecified intracranial injury without loss of consciousness, subsequent encounter: Secondary | ICD-10-CM

## 2013-05-02 DIAGNOSIS — S065X9D Traumatic subdural hemorrhage with loss of consciousness of unspecified duration, subsequent encounter: Secondary | ICD-10-CM

## 2013-05-02 DIAGNOSIS — Z5189 Encounter for other specified aftercare: Secondary | ICD-10-CM

## 2013-05-02 DIAGNOSIS — X58XXXA Exposure to other specified factors, initial encounter: Secondary | ICD-10-CM | POA: Insufficient documentation

## 2013-05-02 MED ORDER — METHYLPHENIDATE HCL 20 MG PO TABS: 20.0000 mg | ORAL_TABLET | Freq: Two times a day (BID) | ORAL | Status: DC

## 2013-05-02 MED ORDER — AMANTADINE HCL 100 MG PO CAPS: 100.0000 mg | ORAL_CAPSULE | Freq: Two times a day (BID) | ORAL | Status: DC

## 2013-05-02 MED ORDER — LEVETIRACETAM 750 MG PO TABS: 750.0000 mg | ORAL_TABLET | Freq: Two times a day (BID) | ORAL | Status: DC

## 2013-05-02 NOTE — Patient Instructions (Signed)

## 2013-05-02 NOTE — Progress Notes (Signed)
Subjective:    Patient ID: Albert [name], male    DOB: 1976/04/22, 37 y.o.   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 He continues at home with his wife.   From an energy standpoint, he is still having fatigue. He states that he feels "crappy" all of the time. The dizziness perhaps is better as well as nausea. He takes his ritalin usually twice a day, but it doesn't appear to have a dramatic effect on his energy although it does help his attention.   He is still in Speech therapy but has run out of PT and OT. We are in the process of appealing for more therapies.   He is not doing much in the way of stretching for his right shoulder. The injection may have helpe a bit in that he's not complaining of a lot of pain, but he tends not to use the arm much for anything.  Sleep patterns are reasonable. He sometimes needs a nap during the day.   He appears to be getting along fairly well at home with family. He continues to require supervision.   Cranioplasty will happen potentially next month. 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 She saw Dr. Orvan Falconer, ID, who calmed some of her fears.    Pain Inventory Average Pain 0 Pain Right Now 0 My pain is n/a  In the last 24 hours, has pain interfered with the following? General activity 0 Relation with others 0 Enjoyment of life 0 What TIME of day is your pain at its worst? varies Sleep (in general) Fair  Pain is worse with: n/a Pain improves with: n/a Relief from Meds: n/a  Mobility ability to climb steps?  yes do you drive?  no transfers alone Do you have any goals in this area?  yes  Function employed # of hrs/week unable to work at this time Do you have any goals in this area?  yes  Neuro/Psych weakness trouble walking dizziness confusion depression anxiety  Prior Studies CT/MRI  Physicians involved in your care Marcy Siren, Karleen Hampshire   Family History  Problem  Relation Age of Onset  . Heart disease Father    History   Social History  . Marital Status: Married    Spouse Name: N/A    Number of Children: N/A  . Years of Education: N/A   Social History Main Topics  . Smoking status: Former Smoker    Types: Cigarettes    Quit date: 12/17/1999  . Smokeless tobacco: None  . Alcohol Use: No  . Drug Use: No  . Sexual Activity: None   Other Topics Concern  . None   Social History Narrative  . None   Past Surgical History  Procedure Laterality Date  . Craniotomy Left 12/15/2012    Procedure: CRANIECTOMY HEMATOMA EVACUATION SUBDURAL WITH PLACEMENT OF BONE FLAP IN ABDOMINAL WALL;  Surgeon: Karn Cassis, MD;  Location: [facility];  Service: Neurosurgery;  Laterality: Left;  . Craniotomy Left 12/28/2012    Procedure: CRANIOTOMY HEMATOMA EVACUATION SUBDURAL;  Surgeon: Maeola Harman, MD;  Location: [facility];  Service: Neurosurgery;  Laterality: Left;  . Peg placement N/A 12/31/2012    Procedure: PERCUTANEOUS ENDOSCOPIC GASTROSTOMY (PEG) PLACEMENT;  Surgeon: Liz Malady, MD;  Location: [facility] ENDOSCOPY;  Service: General;  Laterality: N/A;  bedside  peg  . Percutaneous tracheostomy N/A 12/31/2012    Procedure: PERCUTANEOUS TRACHEOSTOMY (BEDSIDE);  Surgeon: Laurell Josephs  Edmund Hilda, MD;  Location: [facility] OR;  Service: General;  Laterality: N/A;  . Wrist surgery Right 2008   Past Medical History  Diagnosis Date  . Headache(784.0)   . Traumatic brain injury   . 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 Rotator cuff injury     right   BP 122/84  Pulse 76  Resp 14  Ht 6' (1.829 m)  Wt 175 lb (79.379 kg)  BMI 23.73 kg/m2  SpO2 98%     Review of Systems  Musculoskeletal: Positive for gait problem.  Neurological: Positive for dizziness, weakness and headaches.  Psychiatric/Behavioral: Positive for confusion and dysphoric mood. The patient is nervous/anxious.   All other systems reviewed and are negative.       Objective:   Physical Exam  Physical Exam   General: Alert and oriented x 3, No apparent distress. Wearing helmet.  HEENT: Head is normocephalic, atraumatic, PERRLA, EOMI, sclera anicteric, oral mucosa pink and moist, dentition intact, ext ear canals clear,  Neck: Supple without JVD or lymphadenopathy  Heart: Reg rate and rhythm. No murmurs rubs or gallops  Chest: CTA bilaterally without wheezes, rales, or rhonchi; no distress  Abdomen: Soft, non-tender, non-distended, bowel sounds positive.  Extremities: No clubbing, cyanosis, or edema. Pulses are 2+  Skin: Clean and intact without signs of breakdown  Neuro: Pt recalled the month,  year. Told me the basics of why he was here. Remembered being here before. Does not initiated much. No agitated behavior seen. limited attention and focus still, delayed processing . Right HH, no central deficits appreciated. Still apraxic with the right side. Mild resting tone RUE, RLE tr/4. Underlying motor 4/5 on the right. 5/5 on the left. Walks favoring the right side, still drags the right foot during gait a bit. DTR's 3+ on right.  Musculoskeletal: Right shoulder tender with IR, ER, and RTC impingement maneuvers. He had less pain, but shouder ROM was still limited in all planes. Psych: Pt's affect is appropriate. Pt is cooperative GU: scrotum is red with dried flaky skin noted as well     Assessment:  1. 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 Status post craniotomy evacuation of hematoma. He has made substantial progress but still has significant cognitive deficits which affect memory, attention, focus, higher level cognition  2. Old right shoulder injury, likely RTC tendonitis  3. Seizure post- traumatic  4. Scrotal swelling, pain----?dermatitis    Plan:  1. Regarding his right shoulder, he needs to work on regular range of motion. He is out of PT and OT visits, so he'll need to work on exercises at home. No injection today 2. May benefit from [facility]  speech program to help cover the gap until 2015 when he'll have access to more therapy  3. Continue with ritalin for attention, awareness. Will also add amantadine 100mg  qam and then bid after first week if needed.  4. Continue with keppra for seizure proph but decrease to 1150mg  QPM and 750MG  QAM  for two weeks then decrease to 750mg  bid to decrease fatigue. He will need ongoing dosing for seizure prophylaxis. 5. Consider hormonal work up next month for fatigue although I suspect medication causes first.  6. May want to consider urology consult for scrotal "tightness". i'm not sure what else to offer him. 7. Continue with  Dr. Leonides Cave for ongoing counseling as it's been helpful so far.   8. Follow up with me in one month. 30 minutes of face  to face patient care time was spent during this visit. Numerous topics were reviewed. I will also see what we can do in regard to insurance coverage of further therapy treatments. He still needs MUCH more outpt therapy to address his cognition and adaptive skills, right shoulder pain/movement/RTC mgt, gait, etc. He goes for his cranioplasty next month.

## 2013-05-09 ENCOUNTER — Ambulatory Visit: Payer: BC Managed Care – PPO

## 2013-05-14 ENCOUNTER — Ambulatory Visit: Payer: BC Managed Care – PPO | Attending: Physical Medicine & Rehabilitation | Admitting: Speech Pathology

## 2013-05-14 DIAGNOSIS — R279 Unspecified lack of coordination: Secondary | ICD-10-CM | POA: Insufficient documentation

## 2013-05-14 DIAGNOSIS — R262 Difficulty in walking, not elsewhere classified: Secondary | ICD-10-CM | POA: Insufficient documentation

## 2013-05-14 DIAGNOSIS — M2420 Disorder of ligament, unspecified site: Secondary | ICD-10-CM | POA: Insufficient documentation

## 2013-05-14 DIAGNOSIS — R41842 Visuospatial deficit: Secondary | ICD-10-CM | POA: Insufficient documentation

## 2013-05-14 DIAGNOSIS — M6281 Muscle weakness (generalized): Secondary | ICD-10-CM | POA: Insufficient documentation

## 2013-05-14 DIAGNOSIS — M629 Disorder of muscle, unspecified: Secondary | ICD-10-CM | POA: Insufficient documentation

## 2013-05-14 DIAGNOSIS — R4189 Other symptoms and signs involving cognitive functions and awareness: Secondary | ICD-10-CM | POA: Insufficient documentation

## 2013-05-14 DIAGNOSIS — R4701 Aphasia: Secondary | ICD-10-CM | POA: Insufficient documentation

## 2013-05-14 DIAGNOSIS — Z5189 Encounter for other specified aftercare: Secondary | ICD-10-CM | POA: Insufficient documentation

## 2013-05-16 ENCOUNTER — Ambulatory Visit: Payer: BC Managed Care – PPO

## 2013-05-16 ENCOUNTER — Other Ambulatory Visit: Payer: Self-pay | Admitting: Neurosurgery

## 2013-05-16 DIAGNOSIS — X58XXXA Exposure to other specified factors, initial encounter: Secondary | ICD-10-CM

## 2013-05-16 DIAGNOSIS — S069X5A Unspecified intracranial injury with loss of consciousness greater than 24 hours with return to pre-existing conscious level, initial encounter: Secondary | ICD-10-CM

## 2013-05-16 DIAGNOSIS — F09 Unspecified mental disorder due to known physiological condition: Secondary | ICD-10-CM

## 2013-05-16 DIAGNOSIS — F4323 Adjustment disorder with mixed anxiety and depressed mood: Secondary | ICD-10-CM

## 2013-05-21 ENCOUNTER — Ambulatory Visit: Payer: BC Managed Care – PPO

## 2013-05-27 ENCOUNTER — Ambulatory Visit: Payer: BC Managed Care – PPO | Admitting: Speech Pathology

## 2013-05-27 DIAGNOSIS — F09 Unspecified mental disorder due to known physiological condition: Secondary | ICD-10-CM

## 2013-05-27 DIAGNOSIS — X58XXXA Exposure to other specified factors, initial encounter: Secondary | ICD-10-CM

## 2013-05-27 DIAGNOSIS — S069X5A Unspecified intracranial injury with loss of consciousness greater than 24 hours with return to pre-existing conscious level, initial encounter: Secondary | ICD-10-CM

## 2013-05-27 DIAGNOSIS — F4323 Adjustment disorder with mixed anxiety and depressed mood: Secondary | ICD-10-CM

## 2013-05-28 ENCOUNTER — Encounter
Payer: BC Managed Care – PPO | Attending: Physical Medicine & Rehabilitation | Admitting: Physical Medicine & Rehabilitation

## 2013-05-28 ENCOUNTER — Encounter: Payer: Self-pay | Admitting: Physical Medicine & Rehabilitation

## 2013-05-28 VITALS — BP 131/72 | HR 87 | Resp 14 | Ht 72.0 in | Wt 180.0 lb

## 2013-05-28 DIAGNOSIS — X58XXXA Exposure to other specified factors, initial encounter: Secondary | ICD-10-CM | POA: Insufficient documentation

## 2013-05-28 DIAGNOSIS — S066X1D Traumatic subarachnoid hemorrhage with loss of consciousness of 30 minutes or less, subsequent encounter: Secondary | ICD-10-CM

## 2013-05-28 DIAGNOSIS — S069X9S Unspecified intracranial injury with loss of consciousness of unspecified duration, sequela: Secondary | ICD-10-CM

## 2013-05-28 DIAGNOSIS — S46001A Unspecified injury of muscle(s) and tendon(s) of the rotator cuff of right shoulder, initial encounter: Secondary | ICD-10-CM

## 2013-05-28 DIAGNOSIS — S065X9S Traumatic subdural hemorrhage with loss of consciousness of unspecified duration, sequela: Secondary | ICD-10-CM

## 2013-05-28 DIAGNOSIS — S069X0S Unspecified intracranial injury without loss of consciousness, sequela: Secondary | ICD-10-CM

## 2013-05-28 DIAGNOSIS — S4980XA Other specified injuries of shoulder and upper arm, unspecified arm, initial encounter: Secondary | ICD-10-CM

## 2013-05-28 DIAGNOSIS — Z5189 Encounter for other specified aftercare: Secondary | ICD-10-CM | POA: Insufficient documentation

## 2013-05-28 DIAGNOSIS — S069X0D Unspecified intracranial injury without loss of consciousness, subsequent encounter: Secondary | ICD-10-CM

## 2013-05-28 MED ORDER — METHYLPHENIDATE HCL 20 MG PO TABS: 20.0000 mg | ORAL_TABLET | Freq: Two times a day (BID) | ORAL | Status: DC

## 2013-05-28 NOTE — Patient Instructions (Signed)
CALL ME WITH ANY PROBLEMS OR QUESTIONS (#[phone]).  HAVE A GOOD DAY  

## 2013-05-28 NOTE — Progress Notes (Signed)
Subjective:    Patient ID: Albert [name], male    DOB: 1976-07-30, 37 y.o.   MRN: [id number]  HPI  Albert [name] is back regarding his TBI. He is still seeing SLP but he's out of PT and OT. He will see SLP on a stretched out schedule through mid November.  Energy is better. He did not tolerate the amantadine due to nausea so his wife stopped it. The decrease in keppra has helped however. He still uses the ritalin.   Balance remains an issue, and he needs supervision for gait.   Cranioplasty is scheduled for the end of the month.   Right shoulder pain and ROM are somewhat improved. He is doing a few exercises at home.   Pain Inventory Average Pain 6 Pain Right Now 0 My pain is intermittent  In the last 24 hours, has pain interfered with the following? General activity n/a Relation with others n/a Enjoyment of life n/a What TIME of day is your pain at its worst? varies Sleep (in general) Poor  Pain is worse with: n/a Pain improves with: n/a Relief from Meds: n/a  Mobility walk without assistance ability to climb steps?  yes do you drive?  no transfers alone  Function employed # of hrs/week not currently working I need assistance with the following:  meal prep and household duties  Neuro/Psych weakness trouble walking confusion  Prior Studies Any changes since last visit?  no  Physicians involved in your care Any changes since last visit?  no   Family History  Problem Relation Age of Onset  . Heart disease Father    History   Social History  . Marital Status: Married    Spouse Name: N/A    Number of Children: N/A  . Years of Education: N/A   Social History Main Topics  . Smoking status: Former Smoker    Types: Cigarettes    Quit date: 12/17/1999  . Smokeless tobacco: None  . Alcohol Use: No  . Drug Use: No  . Sexual Activity: None   Other Topics Concern  . None   Social History Narrative  . None   Past Surgical History  Procedure Laterality  Date  . Craniotomy Left 12/15/2012    Procedure: CRANIECTOMY HEMATOMA EVACUATION SUBDURAL WITH PLACEMENT OF BONE FLAP IN ABDOMINAL WALL;  Surgeon: Karn Cassis, MD;  Location: [facility];  Service: Neurosurgery;  Laterality: Left;  . Craniotomy Left 12/28/2012    Procedure: CRANIOTOMY HEMATOMA EVACUATION SUBDURAL;  Surgeon: Maeola Harman, MD;  Location: [facility];  Service: Neurosurgery;  Laterality: Left;  . Peg placement N/A 12/31/2012    Procedure: PERCUTANEOUS ENDOSCOPIC GASTROSTOMY (PEG) PLACEMENT;  Surgeon: Liz Malady, MD;  Location: [facility] ENDOSCOPY;  Service: General;  Laterality: N/A;  bedside  peg  . Percutaneous tracheostomy N/A 12/31/2012    Procedure: PERCUTANEOUS TRACHEOSTOMY (BEDSIDE);  Surgeon: Liz Malady, MD;  Location: [facility] OR;  Service: General;  Laterality: N/A;  . Wrist surgery Right 2008   Past Medical History  Diagnosis Date  . Headache(784.0)   . Traumatic brain injury   . 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 Rotator cuff injury     right   BP 131/72  Pulse 87  Resp 14  Ht 6' (1.829 m)  Wt 180 lb (81.647 kg)  BMI 24.41 kg/m2  SpO2 97%     Review of Systems  Gastrointestinal: Positive for vomiting.  Musculoskeletal: Positive for gait problem.  Neurological: Positive for weakness.  Psychiatric/Behavioral: Positive for confusion.  All other systems reviewed and are negative.       Objective:   Physical Exam Physical Exam  General: Alert and oriented x 3, No apparent distress. Wearing helmet.  HEENT: Head is normocephalic, atraumatic, PERRLA, EOMI, sclera anicteric, oral mucosa pink and moist, dentition intact, ext ear canals clear,  Neck: Supple without JVD or lymphadenopathy  Heart: Reg rate and rhythm. No murmurs rubs or gallops  Chest: CTA bilaterally without wheezes, rales, or rhonchi; no distress  Abdomen: Soft, non-tender, non-distended, bowel sounds positive.  Extremities: No clubbing, cyanosis, or edema. Pulses are 2+  Skin: Clean and  intact without signs of breakdown  Neuro: Pt recalled the month, year. Told me the basics of why he was here. Remembered being here before. Does not initiated much. No agitated behavior seen. limited attention and focus still, delayed processing . Right HH, no central deficits appreciated. Still apraxic with the right side. Mild resting tone RUE, RLE tr/4. Underlying motor 4/5 on the right. 5/5 on the left. Walks favoring the right side, still drags the right foot during gait a bit. DTR's 3+ on right. He frequently drifts to the right with gait. Musculoskeletal: Right shoulder less tender with IR, ER, and RTC impingement maneuvers. He had less pain, but shoulder ROM was still limited but improved. He can abduct to almost 90 degrees now.  Psych: Pt's affect is appropriate. Pt is cooperative GU: scrotum is red with dried flaky skin noted as well    Assessment:  1. 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 Status post craniotomy evacuation of hematoma. He has made substantial progress but still has significant cognitive deficits which affect memory, attention, focus, higher level cognition  2. Old right shoulder injury, likely RTC tendonitis  3. Seizure post- traumatic  4. Scrotal swelling, pain----?dermatitis     Plan:  1. Continue with HEP for rom at the right shoulder.  2. Contact [facility]'s speech program to help cover the gap until 2015 when he'll have access to more therapy  3. Continue with ritalin for attention, awareness. 2rf today were written 4. Continue keppra 750mg  bid for 12 months post injury.  5. Consider hormonal work up at the beginning of the year. 6. Still consider urology consult for scrotal "tightness". May want to use a warm, moist towel in am.  7. Would benefit from aquatic activities to assist gait as we await further PT 8. Follow up with me in one month. 30 minutes of face to face patient care time was spent during this visit. Numerous  topics were reviewed. I will also see what we can do in regard to insurance coverage of further therapy treatments. He still needs MUCH more outpt therapy to address his cognition and adaptive skills, right shoulder pain/movement/RTC mgt, gait, etc.   Cranioplasty still is pending.

## 2013-05-29 ENCOUNTER — Encounter: Payer: BC Managed Care – PPO | Admitting: Speech Pathology

## 2013-06-02 ENCOUNTER — Encounter (HOSPITAL_COMMUNITY): Payer: Self-pay

## 2013-06-02 ENCOUNTER — Encounter (HOSPITAL_COMMUNITY)
Admission: RE | Admit: 2013-06-02 | Discharge: 2013-06-02 | Disposition: A | Discharge status: Home / Self Care | Payer: BC Managed Care – PPO | Source: Ambulatory Visit | Attending: Neurosurgery | Admitting: Neurosurgery

## 2013-06-02 DIAGNOSIS — Z01818 Encounter for other preprocedural examination: Secondary | ICD-10-CM | POA: Insufficient documentation

## 2013-06-02 DIAGNOSIS — Z01812 Encounter for preprocedural laboratory examination: Secondary | ICD-10-CM | POA: Insufficient documentation

## 2013-06-02 HISTORY — DX: Dysphagia, unspecified: R13.10

## 2013-06-02 HISTORY — DX: Cerebral infarction, unspecified: I63.9

## 2013-06-02 LAB — CBC
HCT: 41.7 % (ref 39.0–52.0)
Hemoglobin: 14.3 g/dL (ref 13.0–17.0)
MCH: 28.3 pg (ref 26.0–34.0)
MCHC: 34.3 g/dL (ref 30.0–36.0)
MCV: 82.4 fL (ref 78.0–100.0)
Platelets: 242 10*3/uL (ref 150–400)
RBC: 5.06 MIL/uL (ref 4.22–5.81)
RDW: 13.3 % (ref 11.5–15.5)
WBC: 6.7 10*3/uL (ref 4.0–10.5)

## 2013-06-02 LAB — TYPE AND SCREEN
ABO/RH(D): AB POS
Antibody Screen: NEGATIVE

## 2013-06-02 LAB — BASIC METABOLIC PANEL
BUN: 14 mg/dL (ref 6–23)
CO2: 28 mEq/L (ref 19–32)
Calcium: 9.7 mg/dL (ref 8.4–10.5)
Chloride: 101 mEq/L (ref 96–112)
Creatinine, Ser: 0.73 mg/dL (ref 0.50–1.35)
GFR calc Af Amer: >90 mL/min (ref >90)
GFR calc non Af Amer: >90 mL/min (ref >90)
Glucose, Bld: 85 mg/dL (ref 70–99)
Potassium: 3.7 mEq/L (ref 3.5–5.1)
Sodium: 138 mEq/L (ref 135–145)

## 2013-06-02 LAB — SURGICAL PCR SCREEN
MRSA, PCR: POSITIVE — AB
Staphylococcus aureus: POSITIVE — AB

## 2013-06-02 NOTE — Pre-Procedure Instructions (Signed)
Albert [name]  06/02/2013   Your procedure is scheduled on:  06/10/2013  Report to [facility]  2 * 3 at 8:45 AM.  Call this number if you have problems the morning of surgery: [phone]   Remember:   Do not eat food or drink liquids after midnight. On Monday    Take these medicines the morning of surgery with A SIP OF WATER: Keppra   Do not wear jewelry  Do not wear lotions, powders, or perfumes. You may wear deodorant.           . Men may shave face and neck.  Do not bring valuables to the hospital.  [facility] is not responsible                   for any belongings or valuables.  Contacts, dentures or bridgework may not be worn into surgery.  Leave suitcase in the car. After surgery it may be brought to your room.  For patients admitted to the hospital, checkout time is 11:00 AM the day of  discharge.   Patients discharged the day of surgery will not be allowed to drive  home.  Name and phone number of your driver: /w family  Special Instructions: Shower using CHG 2 nights before surgery and the night before surgery.  If you shower the day of surgery use CHG.  Use special wash - you have one bottle of CHG for all showers.  You should use approximately 1/3 of the bottle for each shower.   Please read over the following fact sheets that you were given: Pain Booklet, Coughing and Deep Breathing, Blood Transfusion Information, MRSA Information and Surgical Site Infection Prevention

## 2013-06-03 ENCOUNTER — Ambulatory Visit: Payer: BC Managed Care – PPO | Admitting: Speech Pathology

## 2013-06-09 MED ORDER — SODIUM CHLORIDE 0.9 % IV SOLN
4.0000 mg/kg | INTRAVENOUS | Status: DC
  Administered 2013-06-10: 317.5 mg via INTRAVENOUS
  Filled 2013-06-09 (×2): qty 6.35

## 2013-06-09 NOTE — H&P (Signed)
Albert [name] is an 37 y.o. male.   Chief Complaint: skull defect HPI: patient who underwent craniotomy for severe CHI WITH THE BONE FLAP STORAGE IN TH ABDOMINAL WALL. NOW HE COMES TO HAVE HIS SKULL DEFECT DONE  Past Medical History  Diagnosis Date  . Headache(784.0)   . Traumatic brain injury   . 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 Rotator cuff injury     right  . Stroke     some aphasia , weakness in the R arm , Out pt. rehab currently   . Swallowing problem     still relearning controlled swallowing - rec'ing speech therapy in rehab    Past Surgical History  Procedure Laterality Date  . Craniotomy Left 12/15/2012    Procedure: CRANIECTOMY HEMATOMA EVACUATION SUBDURAL WITH PLACEMENT OF BONE FLAP IN ABDOMINAL WALL;  Surgeon: Karn Cassis, MD;  Location: [facility];  Service: Neurosurgery;  Laterality: Left;  . Craniotomy Left 12/28/2012    Procedure: CRANIOTOMY HEMATOMA EVACUATION SUBDURAL;  Surgeon: Maeola Harman, MD;  Location: [facility];  Service: Neurosurgery;  Laterality: Left;  . Peg placement N/A 12/31/2012    Procedure: PERCUTANEOUS ENDOSCOPIC GASTROSTOMY (PEG) PLACEMENT;  Surgeon: Liz Malady, MD;  Location: [facility] ENDOSCOPY;  Service: General;  Laterality: N/A;  bedside  peg  . Percutaneous tracheostomy N/A 12/31/2012    Procedure: PERCUTANEOUS TRACHEOSTOMY (BEDSIDE);  Surgeon: Liz Malady, MD;  Location: [facility] OR;  Service: General;  Laterality: N/A;  . Wrist surgery Right 2008    Family History  Problem Relation Age of Onset  . Heart disease Father    Social History:  reports that he quit smoking about 13 years ago. His smoking use included Cigarettes. He smoked 0.00 packs per day. He does not have any smokeless tobacco history on file. He reports that he does not drink alcohol or use illicit drugs.  Allergies:  Allergies  Allergen Reactions  . Vancomycin Rash  . Zosyn [Piperacillin Sod-Tazobactam So] Rash    No prescriptions prior to admission    No results  found for this or any previous visit (from the past 48 hour(s)). No results found.  Review of Systems  Constitutional: Negative.   Eyes: Negative.   Respiratory: Negative.   Cardiovascular: Negative.   Genitourinary: Negative.   Musculoskeletal: Negative.   Skin: Negative.   Neurological: Positive for focal weakness and headaches.  Endo/Heme/Allergies: Negative.   Psychiatric/Behavioral: Negative.     There were no vitals taken for this visit. Physical Exam hent, skull defect in left. Neck, nl. Cv, nl. Lungs, clear. Abdomen bone flap in abdominal wall. Extremities, nl/. Neuro  Oriented x 3. No weakness  Assessment/Plan Cranioplasty using his own bone which is in the abdominal wall.  wife and mother are fully aware of risks including infection and the possibility of usind plastic instead of bone  Yaser Harvill M 06/09/2013, 6:04 PM   oriente x3 n no weakness sensory n

## 2013-06-10 ENCOUNTER — Encounter (HOSPITAL_COMMUNITY): Payer: Self-pay | Admitting: Anesthesiology

## 2013-06-10 ENCOUNTER — Encounter (HOSPITAL_COMMUNITY)
Admission: RE | Disposition: A | Discharge status: Home / Self Care | Payer: Self-pay | Source: Ambulatory Visit | Attending: Neurosurgery

## 2013-06-10 ENCOUNTER — Inpatient Hospital Stay (HOSPITAL_COMMUNITY): Payer: BC Managed Care – PPO | Admitting: Anesthesiology

## 2013-06-10 ENCOUNTER — Inpatient Hospital Stay (HOSPITAL_COMMUNITY)
Admission: RE | Admit: 2013-06-10 | Discharge: 2013-06-13 | DRG: 234 | Disposition: A | Discharge status: Home / Self Care | Payer: BC Managed Care – PPO | Source: Ambulatory Visit | Attending: Neurosurgery | Admitting: Neurosurgery

## 2013-06-10 DIAGNOSIS — Z87891 Personal history of nicotine dependence: Secondary | ICD-10-CM

## 2013-06-10 DIAGNOSIS — Z87820 Personal history of traumatic brain injury: Secondary | ICD-10-CM

## 2013-06-10 DIAGNOSIS — I499 Cardiac arrhythmia, unspecified: Secondary | ICD-10-CM | POA: Diagnosis present

## 2013-06-10 DIAGNOSIS — I69920 Aphasia following unspecified cerebrovascular disease: Secondary | ICD-10-CM

## 2013-06-10 DIAGNOSIS — R1310 Dysphagia, unspecified: Secondary | ICD-10-CM | POA: Diagnosis present

## 2013-06-10 DIAGNOSIS — M952 Other acquired deformity of head: Principal | ICD-10-CM | POA: Diagnosis present

## 2013-06-10 HISTORY — PX: CRANIOPLASTY: SHX1407

## 2013-06-10 SURGERY — CRANIOPLASTY
Anesthesia: General | Site: Head | Wound class: Clean

## 2013-06-10 MED ORDER — ADULT MULTIVITAMIN W/MINERALS CH
1.0000 | ORAL_TABLET | Freq: Every day | ORAL | Status: DC
  Administered 2013-06-10 – 2013-06-13 (×4): 1 via ORAL
  Filled 2013-06-10 (×4): qty 1

## 2013-06-10 MED ORDER — NEOSTIGMINE METHYLSULFATE 1 MG/ML IJ SOLN
INTRAMUSCULAR | Status: DC | PRN
  Administered 2013-06-10: 3 mg via INTRAVENOUS

## 2013-06-10 MED ORDER — PANTOPRAZOLE SODIUM 40 MG IV SOLR
40.0000 mg | Freq: Every day | INTRAVENOUS | Status: DC
  Administered 2013-06-10: 40 mg via INTRAVENOUS
  Filled 2013-06-10 (×2): qty 40

## 2013-06-10 MED ORDER — LACTATED RINGERS IV SOLN
INTRAVENOUS | Status: DC
  Administered 2013-06-10 (×2): via INTRAVENOUS

## 2013-06-10 MED ORDER — METHYLPHENIDATE HCL 5 MG PO TABS
20.0000 mg | ORAL_TABLET | Freq: Two times a day (BID) | ORAL | Status: DC
  Administered 2013-06-11 – 2013-06-13 (×3): 20 mg via ORAL
  Filled 2013-06-10 (×7): qty 4

## 2013-06-10 MED ORDER — 0.9 % SODIUM CHLORIDE (POUR BTL) OPTIME
TOPICAL | Status: DC | PRN
  Administered 2013-06-10 (×2): 1000 mL

## 2013-06-10 MED ORDER — LIDOCAINE-EPINEPHRINE 1 %-1:100000 IJ SOLN
INTRAMUSCULAR | Status: DC | PRN
  Administered 2013-06-10: 10 mL

## 2013-06-10 MED ORDER — ONDANSETRON HCL 4 MG PO TABS: 4.0000 mg | ORAL_TABLET | ORAL | Status: DC | PRN

## 2013-06-10 MED ORDER — SODIUM CHLORIDE 0.9 % IV SOLN
317.5000 mg | INTRAVENOUS | Status: AC
  Administered 2013-06-11: 317.5 mg via INTRAVENOUS
  Filled 2013-06-10 (×2): qty 6.35

## 2013-06-10 MED ORDER — LIDOCAINE HCL (CARDIAC) 20 MG/ML IV SOLN
INTRAVENOUS | Status: DC | PRN
  Administered 2013-06-10: 60 mg via INTRAVENOUS

## 2013-06-10 MED ORDER — ONDANSETRON HCL 4 MG/2ML IJ SOLN
INTRAMUSCULAR | Status: DC | PRN
  Administered 2013-06-10: 4 mg via INTRAVENOUS

## 2013-06-10 MED ORDER — GLYCOPYRROLATE 0.2 MG/ML IJ SOLN
INTRAMUSCULAR | Status: DC | PRN
  Administered 2013-06-10: 0.4 mg via INTRAVENOUS

## 2013-06-10 MED ORDER — VECURONIUM BROMIDE 10 MG IV SOLR
INTRAVENOUS | Status: DC | PRN
  Administered 2013-06-10 (×2): 2 mg via INTRAVENOUS

## 2013-06-10 MED ORDER — DEXAMETHASONE SODIUM PHOSPHATE 4 MG/ML IJ SOLN
INTRAMUSCULAR | Status: DC | PRN
  Administered 2013-06-10: 8 mg via INTRAVENOUS

## 2013-06-10 MED ORDER — METHYLPHENIDATE HCL 20 MG PO TABS: 20.0000 mg | ORAL_TABLET | Freq: Two times a day (BID) | ORAL | Status: DC

## 2013-06-10 MED ORDER — SODIUM CHLORIDE 0.9 % IV SOLN
500.0000 mg | Freq: Two times a day (BID) | INTRAVENOUS | Status: DC
  Administered 2013-06-10 – 2013-06-11 (×2): 500 mg via INTRAVENOUS
  Filled 2013-06-10 (×3): qty 5

## 2013-06-10 MED ORDER — FENTANYL CITRATE 0.05 MG/ML IJ SOLN
INTRAMUSCULAR | Status: DC | PRN
  Administered 2013-06-10: 50 ug via INTRAVENOUS
  Administered 2013-06-10: 100 ug via INTRAVENOUS
  Administered 2013-06-10 (×5): 50 ug via INTRAVENOUS

## 2013-06-10 MED ORDER — HYDROCODONE-ACETAMINOPHEN 5-325 MG PO TABS
1.0000 | ORAL_TABLET | ORAL | Status: DC | PRN
  Administered 2013-06-11: 2 via ORAL
  Filled 2013-06-10: qty 2

## 2013-06-10 MED ORDER — THROMBIN 20000 UNITS EX SOLR
CUTANEOUS | Status: DC | PRN
  Administered 2013-06-10: 13:00:00 via TOPICAL

## 2013-06-10 MED ORDER — MORPHINE SULFATE 4 MG/ML IJ SOLN
4.0000 mg | INTRAMUSCULAR | Status: DC | PRN
  Administered 2013-06-10 – 2013-06-11 (×2): 4 mg via INTRAVENOUS
  Filled 2013-06-10 (×2): qty 1

## 2013-06-10 MED ORDER — ROCURONIUM BROMIDE 100 MG/10ML IV SOLN
INTRAVENOUS | Status: DC | PRN
  Administered 2013-06-10: 50 mg via INTRAVENOUS

## 2013-06-10 MED ORDER — MUPIROCIN 2 % EX OINT
1.0000 "application " | TOPICAL_OINTMENT | Freq: Two times a day (BID) | CUTANEOUS | Status: DC
  Administered 2013-06-10 – 2013-06-13 (×6): 1 via TOPICAL
  Filled 2013-06-10 (×2): qty 22

## 2013-06-10 MED ORDER — BACITRACIN ZINC 500 UNIT/GM EX OINT
TOPICAL_OINTMENT | CUTANEOUS | Status: DC | PRN
  Administered 2013-06-10 (×2): 1 via TOPICAL

## 2013-06-10 MED ORDER — ONDANSETRON HCL 4 MG/2ML IJ SOLN: 4.0000 mg | INTRAMUSCULAR | Status: DC | PRN

## 2013-06-10 MED ORDER — PROPOFOL 10 MG/ML IV BOLUS
INTRAVENOUS | Status: DC | PRN
  Administered 2013-06-10: 200 mg via INTRAVENOUS

## 2013-06-10 MED ORDER — LABETALOL HCL 5 MG/ML IV SOLN: 10.0000 mg | INTRAVENOUS | Status: DC | PRN

## 2013-06-10 MED ORDER — PROMETHAZINE HCL 25 MG PO TABS: 12.5000 mg | ORAL_TABLET | ORAL | Status: DC | PRN

## 2013-06-10 MED ORDER — INFLUENZA VAC SPLIT QUAD 0.5 ML IM SUSP
0.5000 mL | INTRAMUSCULAR | Status: AC
  Administered 2013-06-11: 0.5 mL via INTRAMUSCULAR
  Filled 2013-06-10: qty 0.5

## 2013-06-10 MED ORDER — TETANUS-DIPHTH-ACELL PERTUSSIS 5-2.5-18.5 LF-MCG/0.5 IM SUSP
0.5000 mL | Freq: Once | INTRAMUSCULAR | Status: AC
  Administered 2013-06-11: 0.5 mL via INTRAMUSCULAR
  Filled 2013-06-10 (×2): qty 0.5

## 2013-06-10 MED ORDER — LEVETIRACETAM 750 MG PO TABS
750.0000 mg | ORAL_TABLET | Freq: Two times a day (BID) | ORAL | Status: DC
  Filled 2013-06-10: qty 1

## 2013-06-10 SURGICAL SUPPLY — 56 items
BAG DECANTER FOR FLEXI CONT (MISCELLANEOUS) ×2 IMPLANT
BANDAGE GAUZE 4  KLING STR (GAUZE/BANDAGES/DRESSINGS) ×1 IMPLANT
BANDAGE GAUZE ELAST BULKY 4 IN (GAUZE/BANDAGES/DRESSINGS) ×1 IMPLANT
BOWL SPATULA (MISCELLANEOUS) ×2 IMPLANT
CANISTER SUCTION 2500CC (MISCELLANEOUS) ×2 IMPLANT
CLIP RANEY DISP (INSTRUMENTS) ×1 IMPLANT
CLOTH BEACON ORANGE TIMEOUT ST (SAFETY) ×2 IMPLANT
CONT SPEC 4OZ CLIKSEAL STRL BL (MISCELLANEOUS) IMPLANT
CORDS BIPOLAR (ELECTRODE) ×2 IMPLANT
COVER TABLE BACK 60X90 (DRAPES) IMPLANT
DEPRESSOR TONGUE BLADE STERILE (MISCELLANEOUS) IMPLANT
DRAPE NEUROLOGICAL W/INCISE (DRAPES) ×2 IMPLANT
ELECT CAUTERY BLADE 6.4 (BLADE) ×2 IMPLANT
ELECT REM PT RETURN 9FT ADLT (ELECTROSURGICAL) ×2
ELECTRODE REM PT RTRN 9FT ADLT (ELECTROSURGICAL) ×1 IMPLANT
GAUZE SPONGE 4X4 16PLY XRAY LF (GAUZE/BANDAGES/DRESSINGS) IMPLANT
GLOVE BIOGEL M 8.0 STRL (GLOVE) ×4 IMPLANT
GLOVE EXAM NITRILE LRG STRL (GLOVE) IMPLANT
GLOVE EXAM NITRILE MD LF STRL (GLOVE) IMPLANT
GLOVE EXAM NITRILE XL STR (GLOVE) IMPLANT
GLOVE EXAM NITRILE XS STR PU (GLOVE) IMPLANT
GOWN BRE IMP SLV AUR LG STRL (GOWN DISPOSABLE) ×1 IMPLANT
GOWN BRE IMP SLV AUR XL STRL (GOWN DISPOSABLE) ×1 IMPLANT
GOWN STRL REIN 2XL LVL4 (GOWN DISPOSABLE) ×1 IMPLANT
HEMOSTAT SURGICEL 2X14 (HEMOSTASIS) ×2 IMPLANT
HOOK DURA (MISCELLANEOUS) ×2 IMPLANT
KIT BASIN OR (CUSTOM PROCEDURE TRAY) ×2 IMPLANT
KIT ROOM TURNOVER OR (KITS) ×2 IMPLANT
NDL HYPO 25X1 1.5 SAFETY (NEEDLE) ×1 IMPLANT
NEEDLE HYPO 25X1 1.5 SAFETY (NEEDLE) ×2 IMPLANT
NS IRRIG 1000ML POUR BTL (IV SOLUTION) ×2 IMPLANT
PACK CRANIOTOMY (CUSTOM PROCEDURE TRAY) ×2 IMPLANT
PAD ARMBOARD 7.5X6 YLW CONV (MISCELLANEOUS) ×6 IMPLANT
PIN MAYFIELD SKULL DISP (PIN) IMPLANT
PLATE 1.5  2HOLE LNG NEURO (Plate) ×3 IMPLANT
PLATE 1.5 2HOLE LNG NEURO (Plate) IMPLANT
PLATE 1.5/0.5 18.5MM BURR HOLE (Plate) ×1 IMPLANT
SCREW SELF DRILL HT 1.5/4MM (Screw) ×10 IMPLANT
SET CRAINOPLASTY (SET/KITS/TRAYS/PACK) ×2 IMPLANT
SPONGE GAUZE 4X4 12PLY (GAUZE/BANDAGES/DRESSINGS) ×3 IMPLANT
SPONGE SURGIFOAM ABS GEL 100 (HEMOSTASIS) IMPLANT
STAPLER SKIN PROX WIDE 3.9 (STAPLE) ×4 IMPLANT
STAPLER VISISTAT 35W (STAPLE) ×1 IMPLANT
SUT NURALON 4 0 TR CR/8 (SUTURE) IMPLANT
SUT VIC AB 0 CT1 18XCR BRD8 (SUTURE) ×1 IMPLANT
SUT VIC AB 0 CT1 8-18 (SUTURE)
SUT VIC AB 2-0 CP2 18 (SUTURE) ×4 IMPLANT
SUT VIC AB 3-0 SH 8-18 (SUTURE) ×1 IMPLANT
SYR 20ML ECCENTRIC (SYRINGE) ×2 IMPLANT
SYR CONTROL 10ML LL (SYRINGE) ×2 IMPLANT
TAPE CLOTH SURG 4X10 WHT LF (GAUZE/BANDAGES/DRESSINGS) ×2 IMPLANT
TOWEL OR 17X24 6PK STRL BLUE (TOWEL DISPOSABLE) ×2 IMPLANT
TOWEL OR 17X26 10 PK STRL BLUE (TOWEL DISPOSABLE) ×2 IMPLANT
TRAY FOLEY CATH 16FRSI W/METER (SET/KITS/TRAYS/PACK) IMPLANT
UNDERPAD 30X30 INCONTINENT (UNDERPADS AND DIAPERS) IMPLANT
WATER STERILE IRR 1000ML POUR (IV SOLUTION) ×2 IMPLANT

## 2013-06-10 NOTE — Transfer of Care (Signed)
Immediate Anesthesia Transfer of Care Note  Patient: Albert [name]  Procedure(s) Performed: Procedure(s) with comments: CRANIOPLASTY (N/A) - CRANIOPLASTY  Patient Location: PACU  Anesthesia Type:General  Level of Consciousness: awake, alert  and oriented  Airway & Oxygen Therapy: Patient Spontanous Breathing and Patient connected to nasal cannula oxygen  Post-op Assessment: Report given to PACU RN, Post -op Vital signs reviewed and stable and Patient moving all extremities X 4  Post vital signs: Reviewed and stable  Complications: No apparent anesthesia complications

## 2013-06-10 NOTE — Anesthesia Procedure Notes (Addendum)
Procedure Name: Intubation Date/Time: 06/10/2013 11:43 AM Performed by: Quentin Ore Pre-anesthesia Checklist: Patient identified, Emergency Drugs available, Suction available, Patient being monitored and Timeout performed Patient Re-evaluated:Patient Re-evaluated prior to inductionOxygen Delivery Method: Circle system utilized Preoxygenation: Pre-oxygenation with 100% oxygen Intubation Type: IV induction Ventilation: Mask ventilation without difficulty Grade View: Grade III Tube type: Oral Tube size: 7.5 mm Number of attempts: 2 Airway Equipment and Method: Stylet and Video-laryngoscopy Placement Confirmation: ETT inserted through vocal cords under direct vision,  positive ETCO2 and breath sounds checked- equal and bilateral Secured at: 23 cm Tube secured with: Tape Dental Injury: Teeth and Oropharynx as per pre-operative assessment  Comments: DL x 1 with MAC 3, unable to visualize vocal cords per CRNA. AOI with glidescope, Grade II view. +ETCO2 and BBS=.

## 2013-06-10 NOTE — Anesthesia Preprocedure Evaluation (Addendum)
Anesthesia Evaluation  Patient identified by MRN, date of birth, ID band Patient awake    Reviewed: Allergy & Precautions, H&P , NPO status , Patient's Chart, lab work & pertinent test results, reviewed documented beta blocker date and time   Airway Mallampati: I TM Distance: >3 FB Neck ROM: full    Dental  (+) Teeth Intact   Pulmonary pneumonia -, resolved,  breath sounds clear to auscultation        Cardiovascular negative cardio ROS  + dysrhythmias Rhythm:regular     Neuro/Psych  Headaches, Seizures -,   Neuromuscular disease CVA, Residual Symptoms negative psych ROS   GI/Hepatic negative GI ROS, Neg liver ROS,   Endo/Other  negative endocrine ROS  Renal/GU negative Renal ROS  negative genitourinary   Musculoskeletal   Abdominal   Peds  Hematology negative hematology ROS (+)   Anesthesia Other Findings See surgeon's H&P   Reproductive/Obstetrics negative OB ROS                          Anesthesia Physical Anesthesia Plan  ASA: III  Anesthesia Plan: General   Post-op Pain Management:    Induction: Intravenous  Airway Management Planned: Oral ETT  Additional Equipment:   Intra-op Plan:   Post-operative Plan: Extubation in OR  Informed Consent: I have reviewed the patients History and Physical, chart, labs and discussed the procedure including the risks, benefits and alternatives for the proposed anesthesia with the patient or authorized representative who has indicated his/her understanding and acceptance.   Dental Advisory Given  Plan Discussed with: CRNA and Surgeon  Anesthesia Plan Comments:         Anesthesia Quick Evaluation

## 2013-06-10 NOTE — Progress Notes (Signed)
Op note [phone]

## 2013-06-10 NOTE — Progress Notes (Signed)
Chaplain initially met pt and family when pt was admitted in April. Saw family in lobby who informed me pt had surgery today and asked that I stop by. Pt was awake and alert when I arrived. [name] and nurse were bedside. [name] reviewed how sick pt was when admitted in April and celebrated pt's current condition of remarkable improvement. Pt and wife expressed appreciation for Chaplain support and prayers. With wife and pt's permission, had prayer bedside.  Will refer pt to unit Chaplain. Marjory Lies Chaplain

## 2013-06-10 NOTE — Anesthesia Postprocedure Evaluation (Signed)
Anesthesia Post Note  Patient: Albert [name]  Procedure(s) Performed: Procedure(s) (LRB): CRANIOPLASTY (N/A)  Anesthesia type: General  Patient location: PACU  Post pain: Pain level controlled  Post assessment: Patient's Cardiovascular Status Stable  Last Vitals:  Filed Vitals:   06/10/13 1541  BP:   Pulse:   Temp: 36.6 C  Resp:     Post vital signs: Reviewed and stable  Level of consciousness: alert  Complications: No apparent anesthesia complications

## 2013-06-10 NOTE — Plan of Care (Signed)
Problem: Consults Goal: Diagnosis - Craniotomy Cranioplasty

## 2013-06-10 NOTE — Preoperative (Signed)
Beta Blockers   Reason not to administer Beta Blockers:Not Applicable 

## 2013-06-11 LAB — CBC
HCT: 35.3 % — ABNORMAL LOW (ref 39.0–52.0)
Hemoglobin: 12.5 g/dL — ABNORMAL LOW (ref 13.0–17.0)
MCH: 28.9 pg (ref 26.0–34.0)
MCHC: 35.4 g/dL (ref 30.0–36.0)
MCV: 81.7 fL (ref 78.0–100.0)
Platelets: 260 10*3/uL (ref 150–400)
RBC: 4.32 MIL/uL (ref 4.22–5.81)
RDW: 13.2 % (ref 11.5–15.5)
WBC: 10.2 10*3/uL (ref 4.0–10.5)

## 2013-06-11 LAB — BASIC METABOLIC PANEL
BUN: 10 mg/dL (ref 6–23)
CO2: 29 mEq/L (ref 19–32)
Calcium: 9.7 mg/dL (ref 8.4–10.5)
Chloride: 101 mEq/L (ref 96–112)
Creatinine, Ser: 0.73 mg/dL (ref 0.50–1.35)
GFR calc Af Amer: >90 mL/min (ref >90)
GFR calc non Af Amer: >90 mL/min (ref >90)
Glucose, Bld: 119 mg/dL — ABNORMAL HIGH (ref 70–99)
Potassium: 4.6 mEq/L (ref 3.5–5.1)
Sodium: 139 mEq/L (ref 135–145)

## 2013-06-11 MED ORDER — LEVETIRACETAM 750 MG PO TABS
750.0000 mg | ORAL_TABLET | Freq: Two times a day (BID) | ORAL | Status: DC
  Administered 2013-06-11 – 2013-06-13 (×4): 750 mg via ORAL
  Filled 2013-06-11 (×6): qty 1

## 2013-06-11 MED ORDER — PANTOPRAZOLE SODIUM 40 MG PO TBEC
40.0000 mg | DELAYED_RELEASE_TABLET | Freq: Every day | ORAL | Status: DC
  Administered 2013-06-11 – 2013-06-13 (×3): 40 mg via ORAL
  Filled 2013-06-11 (×3): qty 1

## 2013-06-11 NOTE — Progress Notes (Signed)
UR completed.  Mariaeduarda Defranco, RN BSN [facility] CCM Trauma/Neuro ICU Case Manager [phone]  

## 2013-06-11 NOTE — Progress Notes (Signed)
Tdap vaccine given per request of family in Left Deltoid.   Delynn Flavin, RN

## 2013-06-11 NOTE — Progress Notes (Signed)
Patient ID: Albert [name], male   DOB: July 08, 1976, 37 y.o.   MRN: [id number] Stable, some drainage. Neuro stable. To neuro floor

## 2013-06-11 NOTE — Op Note (Signed)
NAMEOWAIS, PRUETT NO.:  [id number]  MEDICAL RECORD NO.:  [id number]  LOCATION:  3M02C                        FACILITY:  [facility]  PHYSICIAN:  Hilda Lias, M.D.   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  Bone flap in the right abdominal wall.  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  Bone flap in the right abdominal wall.  PROCEDURE: 1. Removal of the bone flap of the abdominal wall. 2. Insertion of the bone flap in the cranial defect in the left side.  SURGEON:  Hilda Lias, M.D.  ASSISTANT:  Dr. Jordan Likes.  CLINICAL HISTORY:  Mr. Palladino is a gentleman who underwent craniotomy several months ago because of severe head injury.  The patient did well. He is awake, talking, moving.  We decided to proceed with fix in the skull defect.  The patient has flap in the abdominal wall on the right side.  Antibiotic was given prior to surgery.  The family knew the risk of the surgery including possibility of infection, need further surgery including a new plate made of titanium.  DESCRIPTION OF PROCEDURE:  The patient was taken to the OR, and after intubation, the head and the abdominal wall were prepped with Betadine and DuraPrep.  Drapes were applied.  We established our incision 1st in the abdominal wall in the right side, through the skin and subcutaneous tissue.  The bone was attached to the surrounding tissue with quite a bit of vascularization.  Lysis was accomplished.  Hemostasis was done. The bone flap was removed from the abdominal wall.  The abdominal wall then was irrigated.  Hemostasis was done with bipolar and the wound was closed with 3 layers of Vicryl and staples.  The bone was quite healthy. We decided to proceed with the  repair of the skull defect.  For that, we proceed with a 2nd procedure with infiltration of the scalp with Xylocaine with epinephrine.  Then, the incision following the previous 1 from the left temporal area down posterior and then anterior to the frontal area where was done.  Raney clips were applied to the edges for hemostasis.  The patient had quite a bit of adhesion, but at the end we were able to lift the scalp flap away from the skull defect.  The edges of the scalp defect were cleaned.  Hemostasis was accomplished.  Then, the bone flap was pulled back in place using several plate with several screws.  At the end, we had quite good repair of the bone flap.  The patient has quite a bit of healthy scalp with hemostasis without bleeding and hemostasis was done with bipolar.  At the end, we left a small drain and the wound was closed with different layer of Vicryl and staples.  The patient did well.  He is going to go to the intensive care unit at least overnight.          ______________________________ Hilda Lias, M.D.     EB/MEDQ  D:  06/10/2013  T:  06/11/2013  Job:  [id number]

## 2013-06-12 MED ORDER — OXYCODONE-ACETAMINOPHEN 5-325 MG PO TABS
1.0000 | ORAL_TABLET | ORAL | Status: DC | PRN
  Administered 2013-06-12 – 2013-06-13 (×4): 2 via ORAL
  Filled 2013-06-12 (×4): qty 2

## 2013-06-12 MED ORDER — BISACODYL 5 MG PO TBEC: 5.0000 mg | DELAYED_RELEASE_TABLET | Freq: Every day | ORAL | Status: DC | PRN

## 2013-06-12 NOTE — Evaluation (Signed)
Physical Therapy Evaluation Patient Details Name: Albert [name] MRN: [id number] DOB: 06-23-1976 Today's Date: 06/12/2013 Time: 1005-1026 PT Time Calculation (min): 21 min  PT Assessment / Plan / Recommendation History of Present Illness  37 y.o. 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  He had extensive therapy including CIR and OP PT/OT and SLP.  He is only still current with SLP.    Clinical Impression  I am familiar with this pt from his last admission and worked with him for quite a while on inpatient rehab.  He is doing well physically, moving slowly.  The surgery seems to have re-emphasized some of his previous deficits that had gotten better with OP PT.  He would benefit from continued acute and re-starting his OP PT.  He is still current with OP SLP and this should continue, but I am, and the family is interested in restarting the physical therapy piece as well.  They are not sure of his therapy benefits coverage (if a re-hospitalization would re-start his coverage or not), so I asked the case manager to look into this for them.   PT to follow acutely for deficits listed below.       PT Assessment  Patient needs continued PT services    Follow Up Recommendations  Outpatient PT;Supervision/Assistance - 24 hour    Does the patient have the potential to tolerate intense rehabilitation     Yes  Barriers to Discharge Other (comment) (None) None    Equipment Recommendations  None recommended by PT    Recommendations for Other Services   None at this time  Frequency Min 4X/week    Precautions / Restrictions Precautions Precautions: Fall Precaution Comments: right sided coordination/perception issues hand and leg.     Pertinent Vitals/Pain See vitals flow sheet.      Mobility  Bed Mobility Bed Mobility: Supine to Sit;Sitting - Scoot to Edge of Bed Supine to Sit: 6:  Modified independent (Device/Increase time);With rails;HOB elevated Sitting - Scoot to Edge of Bed: 6: Modified independent (Device/Increase time);With rail Details for Bed Mobility Assistance: pt moving to the side of the bed slowly and pulling on railing for support.   Transfers Transfers: Sit to Stand;Stand to Sit Sit to Stand: 4: Min assist;With upper extremity assist;With armrests;From bed Stand to Sit: 4: Min assist;With upper extremity assist;With armrests;To chair/3-in-1 Details for Transfer Assistance: min assist to steady pt for balance, stood ~1 min to get his "wits about him" before starting to walk.   Ambulation/Gait Ambulation/Gait Assistance: 4: Min assist Ambulation Distance (Feet): 150 Feet Assistive device: 1 person hand held assist Ambulation/Gait Assistance Details: min hand held assist to steady pt for balance during gait.  Pt is walking cautiously and slowly.  Per his wife, slower than just before surgery.   Gait Pattern: Step-through pattern (you can notice a little more effort for foot placement on R) Gait velocity: decreased Stairs: Yes Stairs Assistance: 4: Min assist Stairs Assistance Details (indicate cue type and reason): min assist to steady pt for balance as he is spending extra time with foot placement on the right especially when descending the stairs.   Stair Management Technique: Two rails;Alternating pattern;Forwards Number of Stairs: 5        PT Diagnosis: Difficulty walking;Abnormality of gait;Acute pain  PT Problem List: Decreased strength;Decreased activity tolerance;Decreased balance;Decreased mobility;Decreased coordination;Decreased cognition;Pain PT Treatment Interventions: Gait  training;Stair training;Functional mobility training;Therapeutic activities;Therapeutic exercise;Balance training;Neuromuscular re-education;Cognitive remediation;Patient/family education     PT Goals(Current goals can be found in the care plan section) Acute Rehab PT  Goals Patient Stated Goal: to go home PT Goal Formulation: With patient/family Time For Goal Achievement: 06/26/13 Potential to Achieve Goals: Good  Visit Information  Last PT Received On: 06/12/13 Assistance Needed: +1 History of Present Illness: 37 y.o. 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  He had extensive therapy including CIR and OP PT/OT and SLP.  He is only still current with SLP.         Prior Functioning  Home Living Family/patient expects to be discharged to:: Private residence Living Arrangements: Spouse/significant other Available Help at Discharge: Family;Available 24 hours/day Type of Home: House Home Access: Stairs to enter [employer] of Steps: 4 Entrance Stairs-Rails: Right Home Layout: Two level Alternate Level Stairs-Number of Steps: 12 Alternate Level Stairs-Rails: Right Prior Function Level of Independence: Independent Comments: per wife and sister he was moving around the house independently and still seemed to have some issues with right hand neglect and wife was helping remind him to use it.   Communication Communication: No difficulties Dominant Hand: Right    Cognition  Cognition Arousal/Alertness: Awake/alert Behavior During Therapy: WFL for tasks assessed/performed Overall Cognitive Status: History of cognitive impairments - at baseline    Extremity/Trunk Assessment Upper Extremity Assessment Upper Extremity Assessment: RUE deficits/detail RUE Deficits / Details: h/o R RTC tear, right sided neglect and right upper extremity coordination deficits.   RUE Coordination: decreased fine motor Lower Extremity Assessment Lower Extremity Assessment: RLE deficits/detail RLE Deficits / Details: h/o perceptual issues on the right leg, per wife had not been having issues with this recently, but seems to have returned mildly with this  surgery.  Strength WFL, fine motor coordination decreased.   RLE Coordination: decreased fine motor Cervical / Trunk Assessment Cervical / Trunk Assessment: Normal   Balance Balance Balance Assessed: Yes Static Sitting Balance Static Sitting - Balance Support: Bilateral upper extremity supported;Feet supported Static Sitting - Level of Assistance: 7: Independent Static Standing Balance Static Standing - Balance Support: Left upper extremity supported Static Standing - Level of Assistance: 4: Min assist Dynamic Standing Balance Dynamic Standing - Balance Support: Left upper extremity supported Dynamic Standing - Level of Assistance: 4: Min assist  End of Session PT - End of Session Activity Tolerance: Patient limited by fatigue;Patient limited by pain Patient left: in chair;with call bell/phone within reach;with family/visitor present    Lurena Joiner B. Rooney Swails, PT, DPT [phone]   06/12/2013, 10:50 AM

## 2013-06-12 NOTE — Progress Notes (Signed)
Patient ID: Albert [name], male   DOB: [birth year], 37 y.o.   MRN: [id number] Better, drain out, pt to see

## 2013-06-12 NOTE — Progress Notes (Signed)
Patient has ambulated in halls x2 with 1 person standing by, tolerated well. Gave patient a shower per order, cleaned around head incision and applied iodine over staples afterwards. No new drainage, still has some dried up old drainage that would not come off. Abdominal dressing was coming off so changed it and put a new ABD pad over the abdominal staples. Site is clean, dry, intact with minimal serosanguinous drainage and slightly tender. Will continue to monitor.

## 2013-06-13 ENCOUNTER — Encounter (HOSPITAL_COMMUNITY): Payer: Self-pay | Admitting: Neurosurgery

## 2013-06-13 NOTE — Progress Notes (Signed)
Physical Therapy Treatment Patient Details Name: Albert [name] MRN: [id number] DOB: 1976/06/23 Today's Date: 06/13/2013 Time: 4098-1191 PT Time Calculation (min): 22 min  PT Assessment / Plan / Recommendation  History of Present Illness 37 y.o. 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  He had extensive therapy including CIR and OP PT/OT and SLP.  He is only still current with SLP.     PT Comments   Pt reports that his right leg feels stiff this AM.  He was able to complete more stairs today with min assist and a railing.  His gait speed and DGI are less than when he was discharged from OP PT earlier last month.  I continue to recommend OP PT f/u, but surgeon is going to hold on this until f/u visits.  Family instructed to provide more assist and closer supervision when he goes home and if he doesn't return to his pre-admission normal to let surgeon know.    Follow Up Recommendations  Outpatient PT;Supervision/Assistance - 24 hour     Does the patient have the potential to tolerate intense rehabilitation    Yes  Barriers to Discharge   None      Equipment Recommendations  None recommended by PT    Recommendations for Other Services   None  Frequency Min 4X/week   Progress towards PT Goals Progress towards PT goals: Progressing toward goals  Plan Current plan remains appropriate    Precautions / Restrictions Precautions Precautions: Fall Precaution Comments: right sided coordination/perception issues hand and leg.     Pertinent Vitals/Pain See vitals flow sheet.     Mobility  Bed Mobility Bed Mobility: Supine to Sit;Sitting - Scoot to Edge of Bed Supine to Sit: 6: Modified independent (Device/Increase time);With rails;HOB elevated Sitting - Scoot to Edge of Bed: 6: Modified independent (Device/Increase time);With rail Details for Bed Mobility Assistance: Used railing, but  I do not believe he needed it to get to the side of the bed.   Transfers Transfers: Sit to Stand;Stand to Sit Sit to Stand: 4: Min assist;With upper extremity assist;With armrests;From bed Stand to Sit: 4: Min assist;With upper extremity assist;With armrests;To chair/3-in-1 Details for Transfer Assistance: min assist to steady pt for balance, stood ~1 min to get his "wits about him" before starting to walk.   Ambulation/Gait Ambulation/Gait Assistance: 4: Min assist Ambulation Distance (Feet): 450 Feet Assistive device: 1 person hand held assist Ambulation/Gait Assistance Details: min hand held assist to steady pt for balance, at points he could be close supervision, but he did start to fatigue by the end of the walk with visible muscle fatigue in left leg.   Gait Pattern: Step-through pattern (you can notice a little more effort for foot placement on R) Gait velocity: 1.52 ft/sec (<1.8 ft/sec indicating risk for recurrent falls) Stairs: Yes Stairs Assistance: 4: Min assist Stairs Assistance Details (indicate cue type and reason): min hand held assist left using right railing on right for support.   Stair Management Technique: One rail Right;Step to pattern;Forwards;Other (comment) (with hand held assist on left) Number of Stairs: 9      PT Goals (current goals can now be found in the care plan section) Acute Rehab PT Goals Patient Stated Goal: to go home  Visit Information  Last PT Received On: 06/13/13 Assistance Needed: +1 History of Present Illness: 37 y.o. 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  He had extensive therapy including CIR and OP PT/OT and SLP.  He is only still current with SLP.      Subjective Data  Subjective: Pt reports that his right leg feels "different" stiff per pt report.   Patient Stated Goal: to go home   Cognition  Cognition Arousal/Alertness:  Awake/alert Behavior During Therapy: WFL for tasks assessed/performed Overall Cognitive Status: History of cognitive impairments - at baseline    Balance  Balance Balance Assessed: Yes Static Sitting Balance Static Sitting - Balance Support: No upper extremity supported;Feet supported Static Sitting - Level of Assistance: 7: Independent Static Standing Balance Static Standing - Balance Support: Left upper extremity supported Static Standing - Level of Assistance: 4: Min assist Dynamic Standing Balance Dynamic Standing - Balance Support: Left upper extremity supported Dynamic Standing - Level of Assistance: 4: Min assist Dynamic Gait Index Level Surface: Mild Impairment Change in Gait Speed: Moderate Impairment Gait with Horizontal Head Turns: Moderate Impairment Gait with Vertical Head Turns: Moderate Impairment Gait and Pivot Turn: Mild Impairment Step Over Obstacle: Moderate Impairment Step Around Obstacles: Moderate Impairment Steps: Moderate Impairment Total Score: 10  End of Session PT - End of Session Activity Tolerance: Patient tolerated treatment well Patient left: in bed;with call bell/phone within reach;with family/visitor present        Lurena Joiner B. Santanna Olenik, PT, DPT [phone]   06/13/2013, 11:47 AM

## 2013-06-13 NOTE — Progress Notes (Signed)
Discharge orders received, pt stable, showered, ready to go home. Crani education discussed with pt and wife. Discharged home via wheelchair with wife.

## 2013-06-13 NOTE — Care Management Note (Unsigned)
    Page 1 of 1   06/13/2013     12:15:29 PM   CARE MANAGEMENT NOTE 06/13/2013  Patient:  Albert [name], Albert [name]   Account Number:  [id number]  Date Initiated:  06/13/2013  Documentation initiated by:  Elmer Bales  Subjective/Objective Assessment:   patient admitted for bone flap replacement     Action/Plan:   will follow for discharge needs   Anticipated DC Date:  06/13/2013   Anticipated DC Plan:  HOME/SELF CARE      DC Planning Services  CM consult      Choice offered to / List presented to:             Status of service:  Completed, signed off Medicare Important Message given?   (If response is "NO", the following Medicare IM given date fields will be blank) Date Medicare IM given:   Date Additional Medicare IM given:    Discharge Disposition:    Per UR Regulation:    If discussed at Long Length of Stay Meetings, dates discussed:    Comments:  06/13/13 1145 Elmer Bales RN, MSN, CM- Spoke with Dr Jeral Fruit regarding PT/OT recommendations.  Dr Jeral Fruit is agreeable to patient continuing with his previously scheduled SLP at outpatient neuro rehab.  He does not want patient to be seen by outpatient PT/OT at this time.

## 2013-06-13 NOTE — Discharge Summary (Signed)
Physician Discharge Summary  Patient ID: Albert [name] MRN: [id number] DOB/AGE: Aug 19, 1976 37 y.o.  Admit date: 06/10/2013 Discharge date: 06/13/2013  Admission Diagnoses:cranial defect  Discharge Diagnoses: same Active Problems:   * No active hospital problems. *   Discharged Condition: stable   Hospital Course: surgery  Consults: none  Significant Diagnostic Studies:ct  Treatments: repair cranial defect  Discharge Exam: Blood pressure 113/67, pulse 87, temperature 97.9 F (36.6 C), temperature source Oral, resp. rate 18, height 6' (1.829 m), weight 80.967 kg (178 lb 8 oz), SpO2 99.00%. stable Disposition: home at the care of his family   Future Appointments Provider Department Dept Phone   06/18/2013 9:30 AM Ellen Henri, CCC-SLP [facility] [phone]   06/25/2013 9:30 AM Barron Alvine, CCC-SLP [facility] [phone]   07/02/2013 9:30 AM Nona Dell [facility] [phone]   07/09/2013 9:30 AM Nona Dell [facility] [phone]   07/16/2013 9:30 AM Barron Alvine, CCC-SLP [facility] [phone]   07/16/2013 1:20 PM Ranelle Oyster, MD [facility] [phone]   07/23/2013 9:30 AM Ellen Henri, CCC-SLP [facility] [phone]   07/30/2013 9:30 AM Nona Dell [facility] [phone]   08/06/2013 9:30 AM Barron Alvine, CCC-SLP [facility] [phone]       Medication List    ASK your doctor about these medications       acetaminophen 500 MG chewable tablet  Commonly known as:  TYLENOL  Chew 1,000 mg by mouth every 6 (six) hours as needed for  pain.     [employer] OMEGA-3 GUMMY FISH/DHA 113.5 MG Chew  Chew 2 tablets by mouth daily.     levETIRAcetam 750 MG tablet  Commonly known as:  KEPPRA  Take 1 tablet (750 mg total) by mouth 2 (two) times daily.     methylphenidate 20 MG tablet  Commonly known as:  RITALIN  Take 1 tablet (20 mg total) by mouth 2 (two) times daily.     multivitamin with minerals Tabs tablet  Take 1 tablet by mouth daily.     mupirocin ointment 2 %  Commonly known as:  BACTROBAN  Apply approximately one half inch of ointment inside each nostril twice daily for 5 days beginning 1 week before her upcoming surgery.     PROBIOTIC DAILY PO  Take 1 capsule by mouth daily.         Signed: Karn Cassis 06/13/2013, 11:25 AM

## 2013-06-18 ENCOUNTER — Ambulatory Visit: Payer: BC Managed Care – PPO | Attending: Physical Medicine & Rehabilitation | Admitting: Speech Pathology

## 2013-06-18 DIAGNOSIS — M6281 Muscle weakness (generalized): Secondary | ICD-10-CM | POA: Insufficient documentation

## 2013-06-18 DIAGNOSIS — R279 Unspecified lack of coordination: Secondary | ICD-10-CM | POA: Insufficient documentation

## 2013-06-18 DIAGNOSIS — Z5189 Encounter for other specified aftercare: Secondary | ICD-10-CM | POA: Insufficient documentation

## 2013-06-18 DIAGNOSIS — R41842 Visuospatial deficit: Secondary | ICD-10-CM | POA: Insufficient documentation

## 2013-06-18 DIAGNOSIS — M2420 Disorder of ligament, unspecified site: Secondary | ICD-10-CM | POA: Insufficient documentation

## 2013-06-18 DIAGNOSIS — F09 Unspecified mental disorder due to known physiological condition: Secondary | ICD-10-CM

## 2013-06-18 DIAGNOSIS — F4323 Adjustment disorder with mixed anxiety and depressed mood: Secondary | ICD-10-CM

## 2013-06-18 DIAGNOSIS — R262 Difficulty in walking, not elsewhere classified: Secondary | ICD-10-CM | POA: Insufficient documentation

## 2013-06-18 DIAGNOSIS — R4189 Other symptoms and signs involving cognitive functions and awareness: Secondary | ICD-10-CM | POA: Insufficient documentation

## 2013-06-18 DIAGNOSIS — S069X5A Unspecified intracranial injury with loss of consciousness greater than 24 hours with return to pre-existing conscious level, initial encounter: Secondary | ICD-10-CM

## 2013-06-18 DIAGNOSIS — X58XXXA Exposure to other specified factors, initial encounter: Secondary | ICD-10-CM

## 2013-06-18 DIAGNOSIS — M629 Disorder of muscle, unspecified: Secondary | ICD-10-CM | POA: Insufficient documentation

## 2013-06-18 DIAGNOSIS — R4701 Aphasia: Secondary | ICD-10-CM | POA: Insufficient documentation

## 2013-07-02 ENCOUNTER — Ambulatory Visit: Payer: BC Managed Care – PPO

## 2013-07-02 DIAGNOSIS — F09 Unspecified mental disorder due to known physiological condition: Secondary | ICD-10-CM

## 2013-07-02 DIAGNOSIS — S069X5A Unspecified intracranial injury with loss of consciousness greater than 24 hours with return to pre-existing conscious level, initial encounter: Secondary | ICD-10-CM

## 2013-07-02 DIAGNOSIS — X58XXXA Exposure to other specified factors, initial encounter: Secondary | ICD-10-CM

## 2013-07-02 DIAGNOSIS — F4323 Adjustment disorder with mixed anxiety and depressed mood: Secondary | ICD-10-CM

## 2013-07-09 ENCOUNTER — Ambulatory Visit: Payer: BC Managed Care – PPO

## 2013-07-16 ENCOUNTER — Other Ambulatory Visit: Payer: Self-pay | Admitting: Physical Medicine & Rehabilitation

## 2013-07-16 ENCOUNTER — Encounter: Payer: Self-pay | Admitting: Physical Medicine & Rehabilitation

## 2013-07-16 ENCOUNTER — Ambulatory Visit: Payer: BC Managed Care – PPO | Attending: Physical Medicine & Rehabilitation

## 2013-07-16 ENCOUNTER — Encounter
Payer: BC Managed Care – PPO | Attending: Physical Medicine & Rehabilitation | Admitting: Physical Medicine & Rehabilitation

## 2013-07-16 VITALS — BP 145/83 | HR 106 | Resp 14 | Ht 72.0 in | Wt 189.4 lb

## 2013-07-16 DIAGNOSIS — M6281 Muscle weakness (generalized): Secondary | ICD-10-CM | POA: Insufficient documentation

## 2013-07-16 DIAGNOSIS — Z5189 Encounter for other specified aftercare: Secondary | ICD-10-CM | POA: Insufficient documentation

## 2013-07-16 DIAGNOSIS — F4323 Adjustment disorder with mixed anxiety and depressed mood: Secondary | ICD-10-CM

## 2013-07-16 DIAGNOSIS — R41842 Visuospatial deficit: Secondary | ICD-10-CM | POA: Insufficient documentation

## 2013-07-16 DIAGNOSIS — R279 Unspecified lack of coordination: Secondary | ICD-10-CM | POA: Insufficient documentation

## 2013-07-16 DIAGNOSIS — R4189 Other symptoms and signs involving cognitive functions and awareness: Secondary | ICD-10-CM | POA: Insufficient documentation

## 2013-07-16 DIAGNOSIS — R262 Difficulty in walking, not elsewhere classified: Secondary | ICD-10-CM | POA: Insufficient documentation

## 2013-07-16 DIAGNOSIS — S066X1S Traumatic subarachnoid hemorrhage with loss of consciousness of 30 minutes or less, sequela: Secondary | ICD-10-CM

## 2013-07-16 DIAGNOSIS — F09 Unspecified mental disorder due to known physiological condition: Secondary | ICD-10-CM

## 2013-07-16 DIAGNOSIS — S069X9S Unspecified intracranial injury with loss of consciousness of unspecified duration, sequela: Secondary | ICD-10-CM

## 2013-07-16 DIAGNOSIS — M629 Disorder of muscle, unspecified: Secondary | ICD-10-CM | POA: Insufficient documentation

## 2013-07-16 DIAGNOSIS — R4701 Aphasia: Secondary | ICD-10-CM | POA: Insufficient documentation

## 2013-07-16 DIAGNOSIS — M2420 Disorder of ligament, unspecified site: Secondary | ICD-10-CM | POA: Insufficient documentation

## 2013-07-16 DIAGNOSIS — S065X9S Traumatic subdural hemorrhage with loss of consciousness of unspecified duration, sequela: Secondary | ICD-10-CM

## 2013-07-16 DIAGNOSIS — S46001S Unspecified injury of muscle(s) and tendon(s) of the rotator cuff of right shoulder, sequela: Secondary | ICD-10-CM

## 2013-07-16 DIAGNOSIS — S069X0S Unspecified intracranial injury without loss of consciousness, sequela: Secondary | ICD-10-CM

## 2013-07-16 DIAGNOSIS — X58XXXA Exposure to other specified factors, initial encounter: Secondary | ICD-10-CM | POA: Insufficient documentation

## 2013-07-16 DIAGNOSIS — Z9889 Other specified postprocedural states: Secondary | ICD-10-CM

## 2013-07-16 DIAGNOSIS — IMO0001 Reserved for inherently not codable concepts without codable children: Secondary | ICD-10-CM

## 2013-07-16 DIAGNOSIS — S069X5A Unspecified intracranial injury with loss of consciousness greater than 24 hours with return to pre-existing conscious level, initial encounter: Secondary | ICD-10-CM

## 2013-07-16 MED ORDER — METHYLPHENIDATE HCL ER (LA) 30 MG PO CP24: 30.0000 mg | ORAL_CAPSULE | ORAL | Status: DC

## 2013-07-16 MED ORDER — DONEPEZIL HCL 5 MG PO TABS: 5.0000 mg | ORAL_TABLET | Freq: Every day | ORAL | Status: DC

## 2013-07-16 NOTE — Progress Notes (Signed)
Subjective:    Patient ID: Albert [name], male    DOB: 22-Sep-1975, 37 y.o.   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 He continues with this therapies at the neuro-rehab center. He had SLP and neuropsych appts today. Things have been fairly steady at home. He continues to struggle with his cognitive deficits.   At home he initiates self-care issues such as bathing and dressing. His wife has left him at home for short periods and he has done fairly well. She has questions about driving and work today.   Cranioplasty went without issues last month. He denies headaches or pain currently.   His wife is 3 weeks away from delivering their second child.    Pain Inventory Average Pain 0 Pain Right Now 0 My pain is no pain  In the last 24 hours, has pain interfered with the following? General activity 0 Relation with others 0 Enjoyment of life 0 What TIME of day is your pain at its worst? varies Sleep (in general) Fair  Pain is worse with: na Pain improves with: na Relief from Meds: no pain meds  Mobility walk without assistance ability to climb steps?  yes do you drive?  no transfers alone  Function disabled: date disabled 12/14/12 I need assistance with the following:  meal prep, household duties and shopping  Neuro/Psych dizziness confusion  Prior Studies Any changes since last visit?  no  Physicians involved in your care Any changes since last visit?  no   Family History  Problem Relation Age of Onset  . Heart disease Father    History   Social History  . Marital Status: Married    Spouse Name: N/A    Number of Children: N/A  . Years of Education: N/A   Social History Main Topics  . Smoking status: Former Smoker    Types: Cigarettes    Quit date: 12/17/1999  . Smokeless tobacco: Never Used  . Alcohol Use: No  . Drug Use: No  . Sexual Activity: None   Other Topics Concern  . None   Social History Narrative  . None   Past Surgical  History  Procedure Laterality Date  . Craniotomy Left 12/15/2012    Procedure: CRANIECTOMY HEMATOMA EVACUATION SUBDURAL WITH PLACEMENT OF BONE FLAP IN ABDOMINAL WALL;  Surgeon: Karn Cassis, MD;  Location: [facility];  Service: Neurosurgery;  Laterality: Left;  . Craniotomy Left 12/28/2012    Procedure: CRANIOTOMY HEMATOMA EVACUATION SUBDURAL;  Surgeon: Maeola Harman, MD;  Location: [facility];  Service: Neurosurgery;  Laterality: Left;  . Peg placement N/A 12/31/2012    Procedure: PERCUTANEOUS ENDOSCOPIC GASTROSTOMY (PEG) PLACEMENT;  Surgeon: Liz Malady, MD;  Location: [facility];  Service: General;  Laterality: N/A;  bedside  peg  . Percutaneous tracheostomy N/A 12/31/2012    Procedure: PERCUTANEOUS TRACHEOSTOMY (BEDSIDE);  Surgeon: Liz Malady, MD;  [facility];  Service: General;  Laterality: N/A;  . Wrist surgery Right 2008  . Cranioplasty N/A 06/10/2013    Procedure: CRANIOPLASTY;  Surgeon: Karn Cassis, MD;  Location: [facility];  Service: Neurosurgery;  Laterality: N/A;  CRANIOPLASTY   Past Medical History  Diagnosis Date  . Headache(784.0)   . Traumatic brain injury   . 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 Rotator cuff injury     right  . Stroke     some aphasia , weakness in the R arm , Out pt. rehab currently   .  Swallowing problem     still relearning controlled swallowing - rec'ing speech therapy in rehab   BP 145/83  Pulse 106  Resp 14  Ht 6' (1.829 m)  Wt 189 lb 6.4 oz (85.911 kg)  BMI 25.68 kg/m2  SpO2 97%     Review of Systems  Neurological: Positive for dizziness.  Psychiatric/Behavioral: Positive for confusion.       Objective:   Physical Exam General: Alert and oriented x 3, No apparent distress. Wearing helmet.  HEENT: Head is normocephalic, atraumatic, PERRLA, EOMI, sclera anicteric, oral mucosa pink and moist, dentition intact, ext ear canals clear,  Neck: Supple without JVD or lymphadenopathy  Heart: Reg rate and rhythm. No murmurs  rubs or gallops  Chest: CTA bilaterally without wheezes, rales, or rhonchi; no distress  Abdomen: Soft, non-tender, non-distended, bowel sounds positive.  Extremities: No clubbing, cyanosis, or edema. Pulses are 2+  Skin: Clean and intact without signs of breakdown  Neuro: Pt recalled the month, year. Told me why he was here. Described to me some of the problems he's experiencing at home. Feels that he is "pissing off his wife" if he doesn't do something right. Sometimes gets lost in a though or has problems with recalll. Often says he knows what he wants to say but can't put it in words. Personality more evident, less flat behavior seen. Right HH, no central deficits appreciated. Still apraxic with the right side. Mild resting tone RUE, RLE tr/4. Underlying motor 4/5 on the right. 5/5 on the left. Walks favoring the right side, still drags the right foot during gait but less so. Appears fairly steady on his feet. . DTR's 3+ on right. He frequently drifts to the right with gait.  Musculoskeletal: Right shoulder less tender with IR, ER, and RTC impingement maneuvers. He had less pain, but shoulder ROM was still limited but improved. He can abduct to   90 degrees now.  Psych: Pt's affect is appropriate. Pt is cooperative GU: scrotum is red with dried flaky skin noted as well   Assessment:  1. 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 Status post craniotomy evacuation of hematoma. He has made substantial progress but still has significant cognitive deficits which affect memory, attention, focus, higher level cognition. He is showing improved memory, awareness, insight, initiation, decision making etc.   2. Old right shoulder injury, likely RTC tendonitis  3. Seizure post- traumatic  4. Scrotal swelling, pain----?dermatitis    Plan:  1. Continue with HEP for rom at the right shoulder--will consider OT in the new year. Focus will be more on cognitive based tasks  at this point really.  2. Will try aricept 5mg  qhs to help augment memory.  We talked about the fact that the new baby will give him an opportunity to display his responsibility and decision making skills.  3. Will change him over to ritalin LA 30mg  daily to help with compliance and full-day attention.  4. Continue keppra 750mg  bid for 12 months post injury. Consider weaning a bit further in January.  5. Consider hormonal work up at the beginning of the year.  6. Still consider urology consult for scrotal "tightness" in the future..  7. Would benefit from aquatic activities to assist gait as we await further PT  8. Follow up with me in one month. 30 minutes of face to face patient care time was spent during this visit. Numerous topics were reviewed once again.

## 2013-07-16 NOTE — Patient Instructions (Signed)
CALL ME WITH ANY PROBLEMS OR QUESTIONS (#[phone]).  HAVE A GOOD DAY  

## 2013-07-23 ENCOUNTER — Ambulatory Visit: Payer: BC Managed Care – PPO | Admitting: Speech Pathology

## 2013-07-23 DIAGNOSIS — F09 Unspecified mental disorder due to known physiological condition: Secondary | ICD-10-CM

## 2013-07-23 DIAGNOSIS — F4323 Adjustment disorder with mixed anxiety and depressed mood: Secondary | ICD-10-CM

## 2013-07-23 DIAGNOSIS — S069X5A Unspecified intracranial injury with loss of consciousness greater than 24 hours with return to pre-existing conscious level, initial encounter: Secondary | ICD-10-CM

## 2013-07-23 DIAGNOSIS — X58XXXA Exposure to other specified factors, initial encounter: Secondary | ICD-10-CM

## 2013-07-30 ENCOUNTER — Ambulatory Visit: Payer: BC Managed Care – PPO

## 2013-08-06 ENCOUNTER — Ambulatory Visit: Payer: BC Managed Care – PPO

## 2013-09-16 ENCOUNTER — Encounter: Payer: 59 | Attending: Physical Medicine & Rehabilitation | Admitting: Physical Medicine & Rehabilitation

## 2013-09-16 ENCOUNTER — Encounter: Payer: Self-pay | Admitting: Physical Medicine & Rehabilitation

## 2013-09-16 VITALS — BP 136/84 | HR 85 | Resp 14 | Ht 72.0 in | Wt 199.8 lb

## 2013-09-16 DIAGNOSIS — S4980XA Other specified injuries of shoulder and upper arm, unspecified arm, initial encounter: Secondary | ICD-10-CM | POA: Insufficient documentation

## 2013-09-16 DIAGNOSIS — S069XAS Unspecified intracranial injury with loss of consciousness status unknown, sequela: Secondary | ICD-10-CM

## 2013-09-16 DIAGNOSIS — S069X0S Unspecified intracranial injury without loss of consciousness, sequela: Secondary | ICD-10-CM

## 2013-09-16 DIAGNOSIS — S46909A Unspecified injury of unspecified muscle, fascia and tendon at shoulder and upper arm level, unspecified arm, initial encounter: Secondary | ICD-10-CM

## 2013-09-16 DIAGNOSIS — S46001A Unspecified injury of muscle(s) and tendon(s) of the rotator cuff of right shoulder, initial encounter: Secondary | ICD-10-CM

## 2013-09-16 DIAGNOSIS — S066X1S Traumatic subarachnoid hemorrhage with loss of consciousness of 30 minutes or less, sequela: Secondary | ICD-10-CM

## 2013-09-16 DIAGNOSIS — X58XXXA Exposure to other specified factors, initial encounter: Secondary | ICD-10-CM | POA: Insufficient documentation

## 2013-09-16 DIAGNOSIS — S065X9S Traumatic subdural hemorrhage with loss of consciousness of unspecified duration, sequela: Secondary | ICD-10-CM

## 2013-09-16 DIAGNOSIS — S069X9S Unspecified intracranial injury with loss of consciousness of unspecified duration, sequela: Secondary | ICD-10-CM

## 2013-09-16 DIAGNOSIS — Z5181 Encounter for therapeutic drug level monitoring: Secondary | ICD-10-CM

## 2013-09-16 DIAGNOSIS — Z79899 Other long term (current) drug therapy: Secondary | ICD-10-CM

## 2013-09-16 MED ORDER — DONEPEZIL HCL 10 MG PO TABS: 10.0000 mg | ORAL_TABLET | Freq: Every day | ORAL | Status: DC

## 2013-09-16 NOTE — Progress Notes (Signed)
Subjective:    Patient ID: Albert [name], male    DOB: 1976/01/31, 38 y.o.   MRN: [id number]  HPI  Albert [name] is back regarding his TBI. He has initiated the aricept, and his wife feels that it has helped his memory somewhat. He's had no adverse effects. He hasn't started the LA ritalin yet as he has his old ritalin left at home which his wife is using up.   His wife still notes he lacks initiation and can explain how to perform a task, but it when it comes down to executing the task, he struggles to organize.  Behaviorally, he seems to have been getting along with people at home even though he can be a little short at times.    Pain Inventory Average Pain 7 Pain Right Now 0 My pain is no pain  In the last 24 hours, has pain interfered with the following? General activity 0 Relation with others 0 Enjoyment of life 0 What TIME of day is your pain at its worst? varies Sleep (in general) Good  Pain is worse with: na Pain improves with: na Relief from Meds: no pain meds  Mobility walk without assistance ability to climb steps?  yes do you drive?  no transfers alone Do you have any goals in this area?  yes  Function not employed: date last employed 09/10/12  Neuro/Psych weakness trouble walking confusion  Prior Studies Any changes since last visit?  no  Physicians involved in your care Any changes since last visit?  no   Family History  Problem Relation Age of Onset  . Heart disease Father    History   Social History  . Marital Status: Married    Spouse Name: N/A    Number of Children: N/A  . Years of Education: N/A   Social History Main Topics  . Smoking status: Former Smoker    Types: Cigarettes    Quit date: 12/17/1999  . Smokeless tobacco: Never Used  . Alcohol Use: No  . Drug Use: No  . Sexual Activity: None   Other Topics Concern  . None   Social History Narrative  . None   Past Surgical History  Procedure Laterality Date  . Craniotomy  Left 12/15/2012    Procedure: CRANIECTOMY HEMATOMA EVACUATION SUBDURAL WITH PLACEMENT OF BONE FLAP IN ABDOMINAL WALL;  Surgeon: Albert Stakes, MD;  Location: [facility];  Service: Neurosurgery;  Laterality: Left;  . Craniotomy Left 12/28/2012    Procedure: CRANIOTOMY HEMATOMA EVACUATION SUBDURAL;  Surgeon: Albert Levine, MD;  Location: [facility];  Service: Neurosurgery;  Laterality: Left;  . Peg placement N/A 12/31/2012    Procedure: PERCUTANEOUS ENDOSCOPIC GASTROSTOMY (PEG) PLACEMENT;  Surgeon: Albert Jarred, MD;  Location: [facility];  Service: General;  Laterality: N/A;  bedside  peg  . Percutaneous tracheostomy N/A 12/31/2012    Procedure: PERCUTANEOUS TRACHEOSTOMY (BEDSIDE);  Surgeon: Albert Jarred, MD;  Location: [facility];  Service: General;  Laterality: N/A;  . Wrist surgery Right 2008  . Cranioplasty N/A 06/10/2013    Procedure: CRANIOPLASTY;  Surgeon: Albert Stakes, MD;  Location: [facility];  Service: Neurosurgery;  Laterality: N/A;  CRANIOPLASTY   Past Medical History  Diagnosis Date  . Headache(784.0)   . Traumatic brain injury   . 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 Rotator cuff injury     right  . Stroke     some aphasia , weakness in the R arm , Out  pt. rehab currently   . Swallowing problem     still relearning controlled swallowing - rec'ing speech therapy in rehab   BP 136/84  Pulse 85  Resp 14  Ht 6' (1.829 m)  Wt 199 lb 12.8 oz (90.629 kg)  BMI 27.09 kg/m2  SpO2 99%      Review of Systems  Musculoskeletal: Positive for gait problem.  Neurological: Positive for weakness and headaches.  Psychiatric/Behavioral: Positive for confusion.  All other systems reviewed and are negative.       Objective:   Physical Exam  General: Alert and oriented x 3, No apparent distress. Wearing helmet.  HEENT: Head is normocephalic, atraumatic, PERRLA, EOMI, sclera anicteric, oral mucosa pink and moist, dentition intact, ext ear canals clear,  Neck: Supple without JVD  or lymphadenopathy  Heart: Reg rate and rhythm. No murmurs rubs or gallops  Chest: CTA bilaterally without wheezes, rales, or rhonchi; no distress  Abdomen: Soft, non-tender, non-distended, bowel sounds positive.  Extremities: No clubbing, cyanosis, or edema. Pulses are 2+  Skin: Clean and intact without signs of breakdown  Neuro: Pt recalled the month, year. Told me why he was here. Recalls events at home. Still with STM deficits. Right HH, no central deficits appreciated. Still apraxic with the right side. Mild resting tone RUE, RLE tr/4. Underlying motor 5/5 on the right. 5/5 on the left. Walks favoring the right side, with a bit of a steppage pattern. Appears fairly steady on his feet.  DTR's 3+ on right.   Musculoskeletal: Right shoulder non tender with IR, ER, and RTC impingement maneuvers. He had less pain, but shoulder ROM was still limited but improved. He can abduct to 90 degrees +.  Psych: Pt's affect is appropriate. Pt is cooperative GU: scrotum not visualized   Assessment:  1. 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 Status post craniotomy evacuation of hematoma. He has made substantial progress but still has significant cognitive deficits which affect memory, attention, focus, higher level cognition. He is showing improved memory, awareness, insight, initiation, decision making etc.  2. Old right shoulder injury, likely RTC tendonitis--improved 3. Seizure post- traumatic  4. Scrotal swelling, pain----?dermatitis    Plan:  1. Made referrals to SLP, OT, PT to address cognitve deficits in the context of his physical needs. He has made some improvements in his executive function and memory which warrant a re-evaluation. I also made a referral to Dr. Valentina [name] to help with the behavioral aspects of his recovery. He is experiencing a little more irritability over the last few months. He doesn't have good awareness of it as of yet. Consider low dose  propranolol trial also. 2. Will increase aricept to 10mg  qhs to further help memory.   3. start ritalin LA 30mg  daily once current supply is exhausted. 4. Continue keppra 750mg  bid for 12 months post injury. Consider weaning a bit further in January.  5. Consider hormonal work up    8. Follow up with me in 2 months. 30 minutes of face to face patient care time was spent during this visit. Numerous topics were reviewed once again.

## 2013-09-16 NOTE — Addendum Note (Signed)
Addended by: Jules Schick on: 09/16/2013 10:36 AM   Modules accepted: Orders

## 2013-09-16 NOTE — Patient Instructions (Signed)
CONTINUE TO WORK ON YOUR BEHAVIOR AND MOOD AROUND YOUR FAMILY. YOU HAVE COME A LONG WAY BUT THERE IS STILL WORK TO DO.

## 2013-09-23 ENCOUNTER — Ambulatory Visit: Payer: 59 | Attending: Physical Medicine & Rehabilitation | Admitting: Physical Therapy

## 2013-09-23 DIAGNOSIS — F4323 Adjustment disorder with mixed anxiety and depressed mood: Secondary | ICD-10-CM

## 2013-09-23 DIAGNOSIS — F09 Unspecified mental disorder due to known physiological condition: Secondary | ICD-10-CM

## 2013-09-23 DIAGNOSIS — M629 Disorder of muscle, unspecified: Secondary | ICD-10-CM | POA: Insufficient documentation

## 2013-09-23 DIAGNOSIS — R4701 Aphasia: Secondary | ICD-10-CM | POA: Insufficient documentation

## 2013-09-23 DIAGNOSIS — M2420 Disorder of ligament, unspecified site: Secondary | ICD-10-CM | POA: Insufficient documentation

## 2013-09-23 DIAGNOSIS — R41842 Visuospatial deficit: Secondary | ICD-10-CM | POA: Insufficient documentation

## 2013-09-23 DIAGNOSIS — M6281 Muscle weakness (generalized): Secondary | ICD-10-CM | POA: Insufficient documentation

## 2013-09-23 DIAGNOSIS — R279 Unspecified lack of coordination: Secondary | ICD-10-CM | POA: Insufficient documentation

## 2013-09-23 DIAGNOSIS — Z5189 Encounter for other specified aftercare: Secondary | ICD-10-CM | POA: Insufficient documentation

## 2013-09-23 DIAGNOSIS — X58XXXA Exposure to other specified factors, initial encounter: Secondary | ICD-10-CM

## 2013-09-23 DIAGNOSIS — S069X5A Unspecified intracranial injury with loss of consciousness greater than 24 hours with return to pre-existing conscious level, initial encounter: Secondary | ICD-10-CM

## 2013-09-23 DIAGNOSIS — R4189 Other symptoms and signs involving cognitive functions and awareness: Secondary | ICD-10-CM | POA: Insufficient documentation

## 2013-09-23 DIAGNOSIS — R262 Difficulty in walking, not elsewhere classified: Secondary | ICD-10-CM | POA: Insufficient documentation

## 2013-09-25 ENCOUNTER — Ambulatory Visit: Payer: 59 | Admitting: Physical Therapy

## 2013-09-25 DIAGNOSIS — S069X5A Unspecified intracranial injury with loss of consciousness greater than 24 hours with return to pre-existing conscious level, initial encounter: Secondary | ICD-10-CM

## 2013-09-25 DIAGNOSIS — F09 Unspecified mental disorder due to known physiological condition: Secondary | ICD-10-CM

## 2013-09-25 DIAGNOSIS — F4323 Adjustment disorder with mixed anxiety and depressed mood: Secondary | ICD-10-CM

## 2013-09-25 DIAGNOSIS — X58XXXA Exposure to other specified factors, initial encounter: Secondary | ICD-10-CM

## 2013-09-26 ENCOUNTER — Ambulatory Visit: Payer: 59

## 2013-09-26 ENCOUNTER — Ambulatory Visit: Payer: 59 | Admitting: Occupational Therapy

## 2013-09-29 ENCOUNTER — Encounter: Payer: 59 | Admitting: Occupational Therapy

## 2013-09-29 ENCOUNTER — Ambulatory Visit: Payer: 59 | Admitting: Physical Therapy

## 2013-09-30 ENCOUNTER — Ambulatory Visit: Payer: 59 | Admitting: Speech Pathology

## 2013-09-30 ENCOUNTER — Ambulatory Visit: Payer: 59 | Admitting: Physical Therapy

## 2013-09-30 ENCOUNTER — Ambulatory Visit: Payer: 59 | Admitting: Occupational Therapy

## 2013-09-30 DIAGNOSIS — F09 Unspecified mental disorder due to known physiological condition: Secondary | ICD-10-CM

## 2013-09-30 DIAGNOSIS — S069X5A Unspecified intracranial injury with loss of consciousness greater than 24 hours with return to pre-existing conscious level, initial encounter: Secondary | ICD-10-CM

## 2013-09-30 DIAGNOSIS — F4323 Adjustment disorder with mixed anxiety and depressed mood: Secondary | ICD-10-CM

## 2013-09-30 DIAGNOSIS — X58XXXA Exposure to other specified factors, initial encounter: Secondary | ICD-10-CM

## 2013-10-02 ENCOUNTER — Ambulatory Visit: Payer: 59 | Admitting: Physical Therapy

## 2013-10-02 ENCOUNTER — Ambulatory Visit: Payer: 59 | Admitting: Occupational Therapy

## 2013-10-07 ENCOUNTER — Ambulatory Visit: Payer: 59 | Admitting: Physical Therapy

## 2013-10-07 ENCOUNTER — Encounter: Payer: 59 | Admitting: Speech Pathology

## 2013-10-07 ENCOUNTER — Ambulatory Visit: Payer: 59 | Admitting: Occupational Therapy

## 2013-10-07 DIAGNOSIS — F4323 Adjustment disorder with mixed anxiety and depressed mood: Secondary | ICD-10-CM

## 2013-10-07 DIAGNOSIS — X58XXXA Exposure to other specified factors, initial encounter: Secondary | ICD-10-CM

## 2013-10-07 DIAGNOSIS — F09 Unspecified mental disorder due to known physiological condition: Secondary | ICD-10-CM

## 2013-10-07 DIAGNOSIS — S069X5A Unspecified intracranial injury with loss of consciousness greater than 24 hours with return to pre-existing conscious level, initial encounter: Secondary | ICD-10-CM

## 2013-10-08 ENCOUNTER — Encounter: Payer: 59 | Admitting: Occupational Therapy

## 2013-10-08 ENCOUNTER — Ambulatory Visit: Payer: 59 | Admitting: Physical Therapy

## 2013-10-10 ENCOUNTER — Ambulatory Visit: Payer: 59 | Admitting: Physical Therapy

## 2013-10-10 ENCOUNTER — Encounter: Payer: 59 | Admitting: Occupational Therapy

## 2013-10-11 IMAGING — CR DG CHEST 1V PORT
1 series · 1 of 1 positions shown · non-contrast
Comparison: 01/01/2013 and prior chest radiographs dating back to
12/14/2012

CLINICAL DATA: Fever.

PORTABLE CHEST - 1 VIEW

[AP]
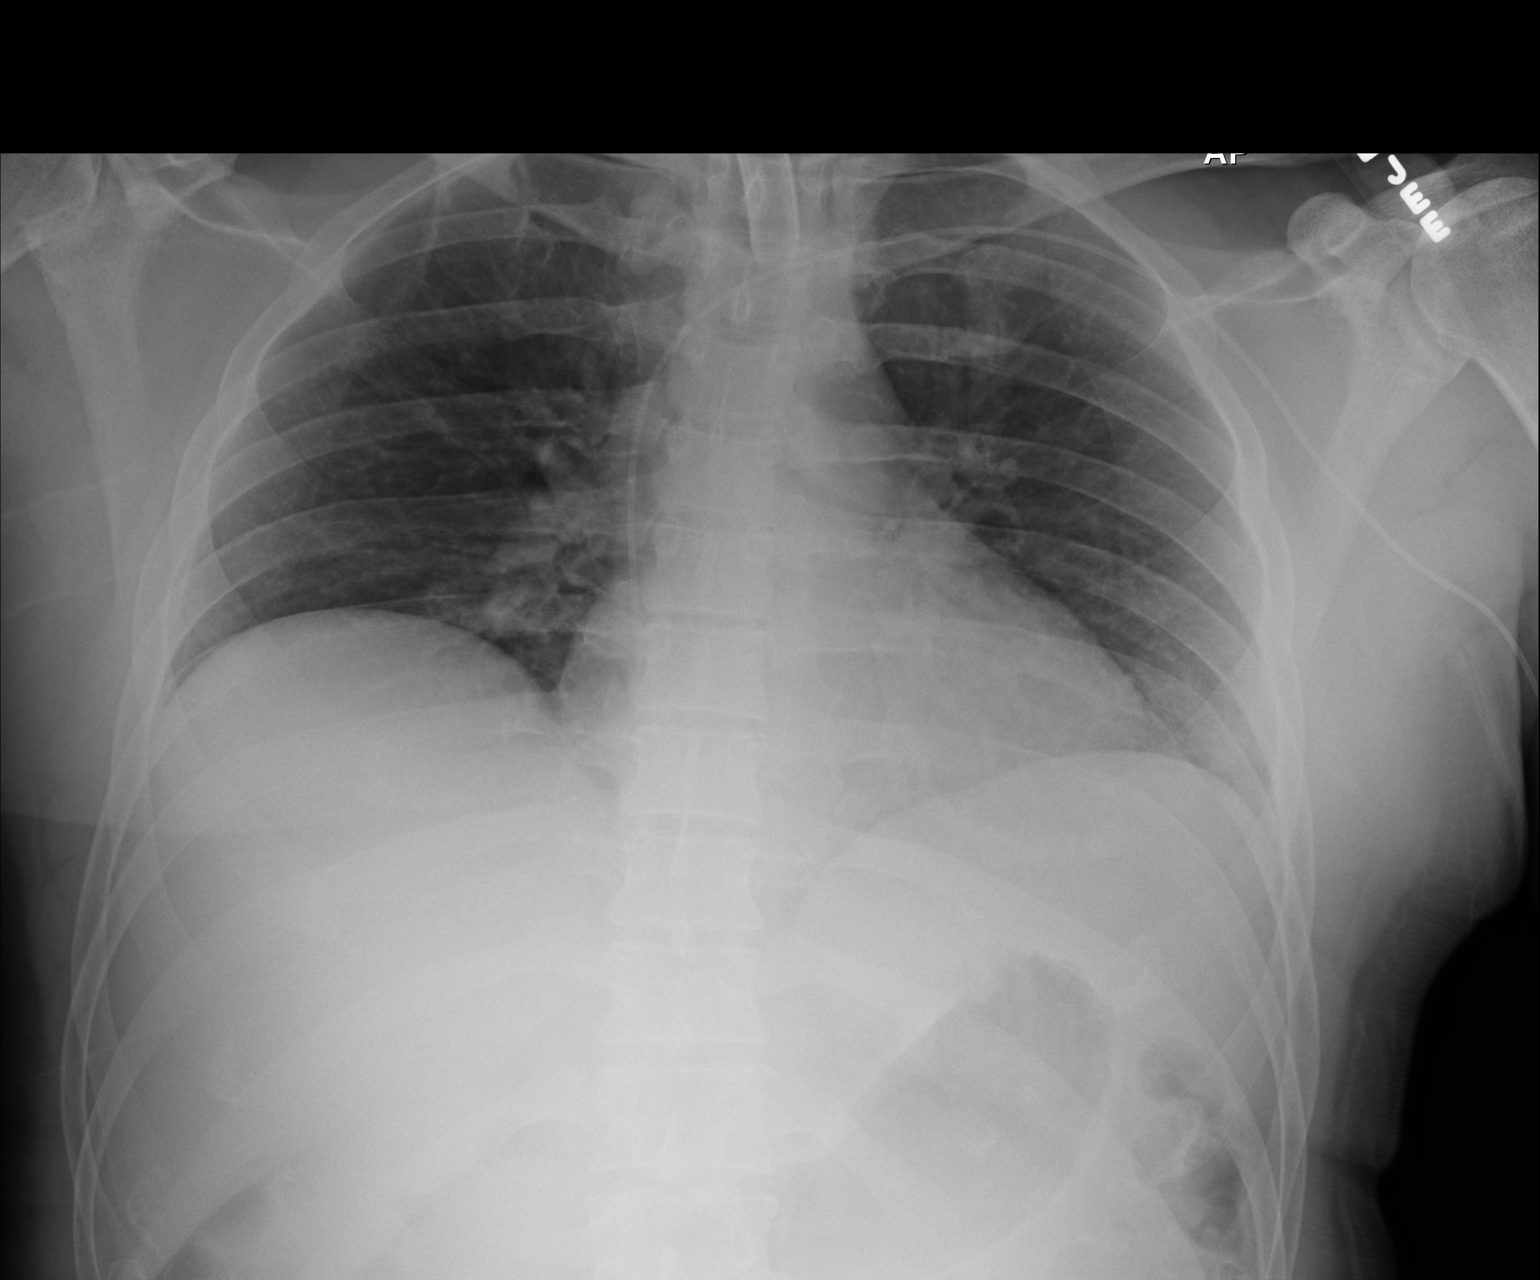

[1 of 1 positions shown; findings below may reference images not displayed]

FINDINGS: A tracheostomy tube and left PICC line with tip overlying
cavoatrial junction again noted.
This is a low-volume film.
Mild elevation of the right hemidiaphragm is unchanged.
There is no evidence of focal airspace disease, pulmonary edema,
suspicious pulmonary nodule/mass, pleural effusion, or
pneumothorax.
No acute bony abnormalities are identified.
IMPRESSION: No evidence of acute cardiopulmonary disease.

Support apparatus as described.

## 2013-10-14 ENCOUNTER — Ambulatory Visit: Payer: 59 | Admitting: Occupational Therapy

## 2013-10-14 ENCOUNTER — Ambulatory Visit: Payer: 59 | Attending: Physical Medicine & Rehabilitation | Admitting: Physical Therapy

## 2013-10-14 DIAGNOSIS — R4701 Aphasia: Secondary | ICD-10-CM | POA: Insufficient documentation

## 2013-10-14 DIAGNOSIS — R41842 Visuospatial deficit: Secondary | ICD-10-CM | POA: Insufficient documentation

## 2013-10-14 DIAGNOSIS — R262 Difficulty in walking, not elsewhere classified: Secondary | ICD-10-CM | POA: Insufficient documentation

## 2013-10-14 DIAGNOSIS — M629 Disorder of muscle, unspecified: Secondary | ICD-10-CM | POA: Insufficient documentation

## 2013-10-14 DIAGNOSIS — R279 Unspecified lack of coordination: Secondary | ICD-10-CM | POA: Insufficient documentation

## 2013-10-14 DIAGNOSIS — Z5189 Encounter for other specified aftercare: Secondary | ICD-10-CM | POA: Insufficient documentation

## 2013-10-14 DIAGNOSIS — M6281 Muscle weakness (generalized): Secondary | ICD-10-CM | POA: Insufficient documentation

## 2013-10-14 DIAGNOSIS — R4189 Other symptoms and signs involving cognitive functions and awareness: Secondary | ICD-10-CM | POA: Insufficient documentation

## 2013-10-14 DIAGNOSIS — M2420 Disorder of ligament, unspecified site: Secondary | ICD-10-CM | POA: Insufficient documentation

## 2013-10-16 ENCOUNTER — Ambulatory Visit: Payer: 59 | Admitting: Occupational Therapy

## 2013-10-16 ENCOUNTER — Ambulatory Visit: Payer: 59 | Admitting: Physical Therapy

## 2013-10-16 IMAGING — CT CT HEAD W/O CM
1 of 2 series · 15 of 30 positions shown, 19 images · non-contrast
Comparison: Head CTs 12/31/2012 and earlier.  Brain MRI 12/24/2012.

CLINICAL DATA: 37-year-old male with subdural and subarachnoid
hemorrhage after fall.  Traumatic brain injury.  Status post
craniotomy evacuation of hematoma with insertion of bone flap into
the abdomen.

CT HEAD WITHOUT CONTRAST
TECHNIQUE: Contiguous axial images were obtained from the base of
the skull through the vertex without contrast.

[Series 3: recon 2: brain · axial · 0.47mm/px · z∈[+84,+244]mm · 15 of 72 slices shown, 19 images]
[im 4/72  brain]
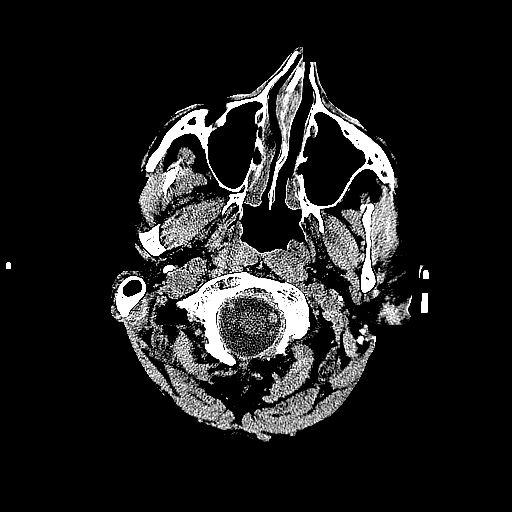
[im 4/72  bone]
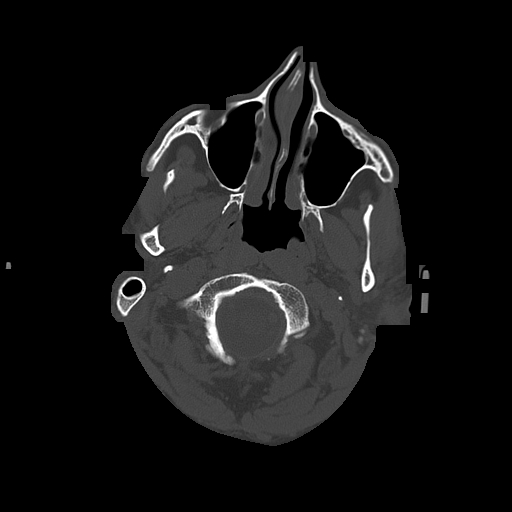
[im 8/72  brain]
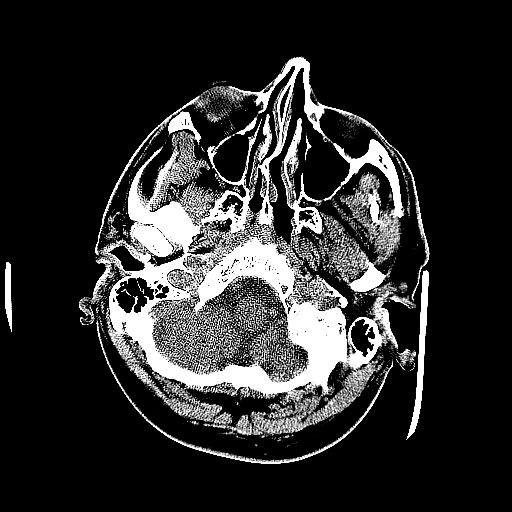
[im 15/72  brain]
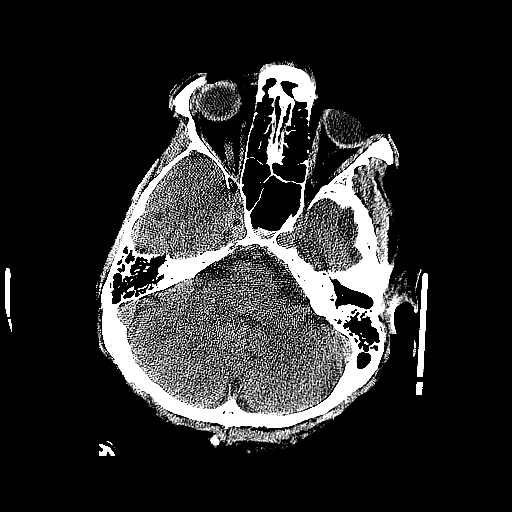
[im 19/72  brain]
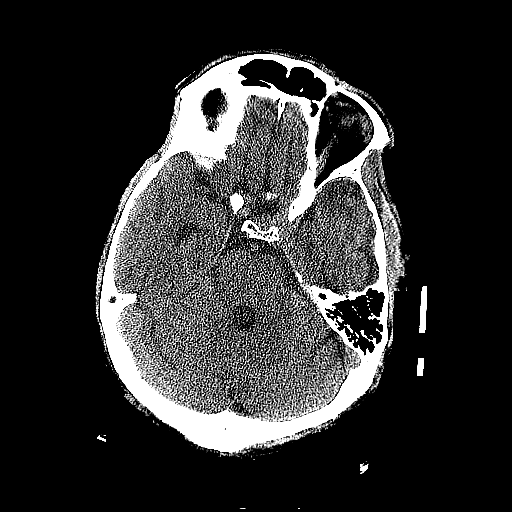
[im 23/72  brain]
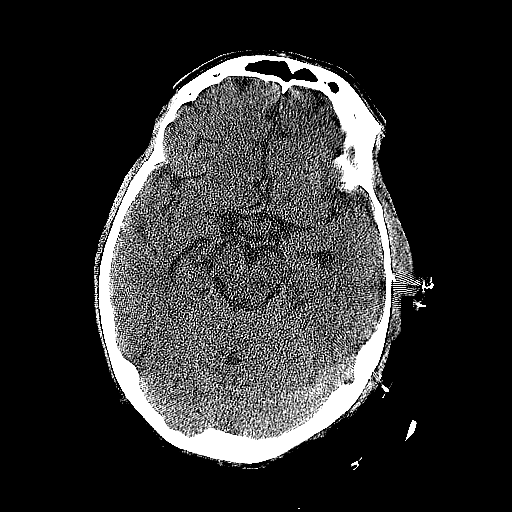
[im 23/72  bone]
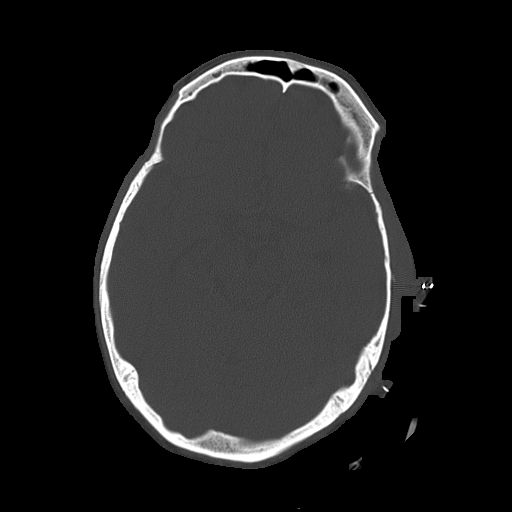
[im 27/72  brain]
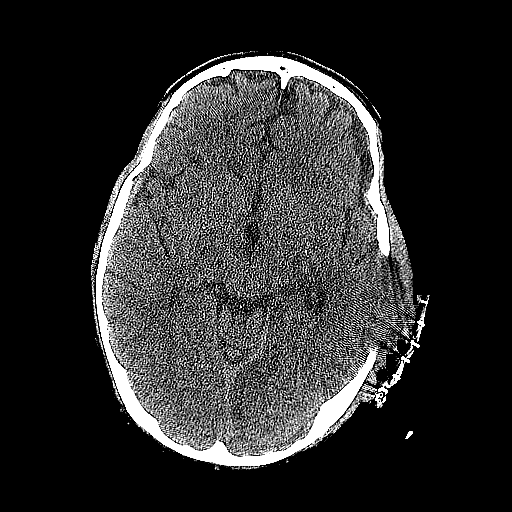
[im 30/72  brain]
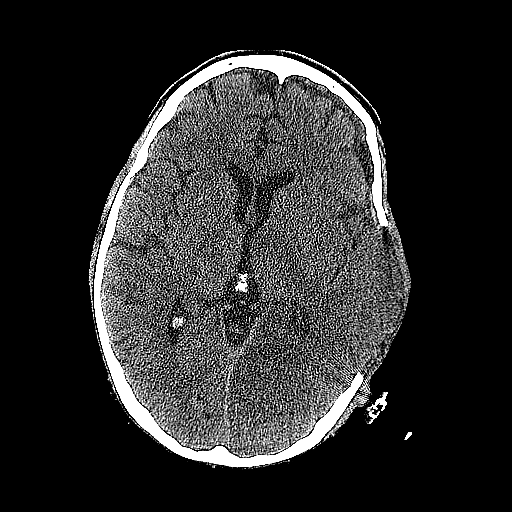
[im 38/72  brain]
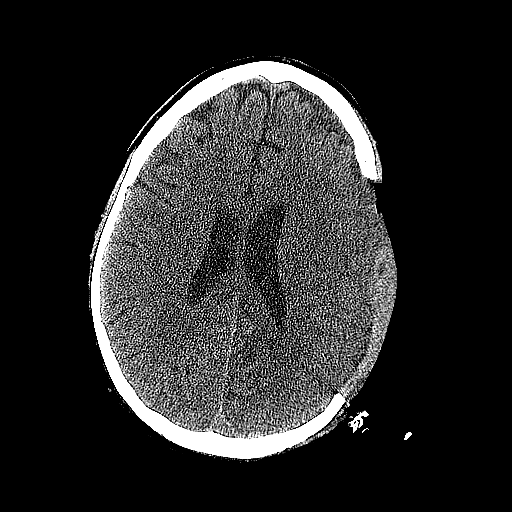
[im 42/72  brain]
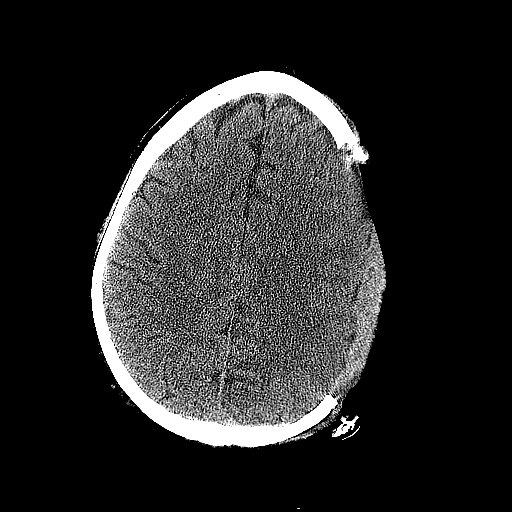
[im 42/72  bone]
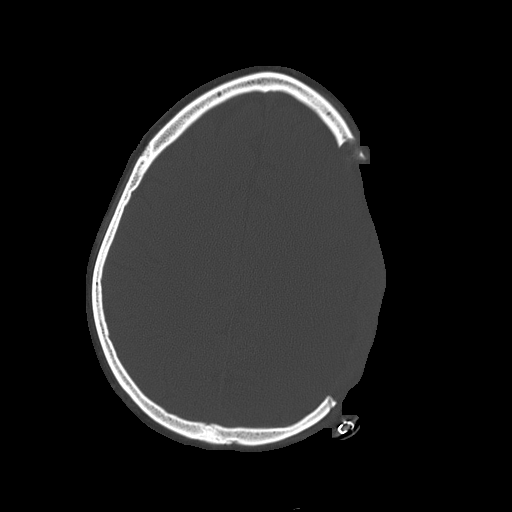
[im 45/72  brain]
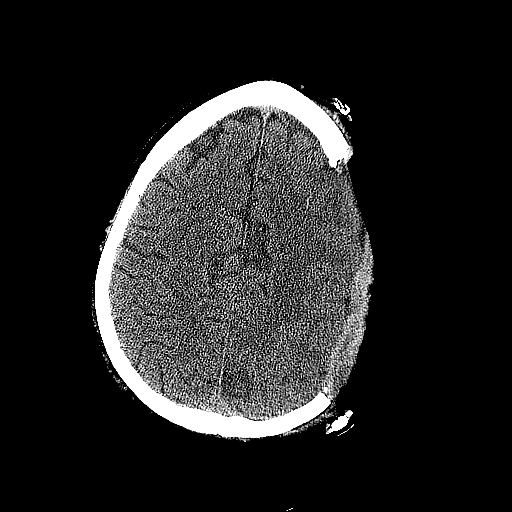
[im 49/72  brain]
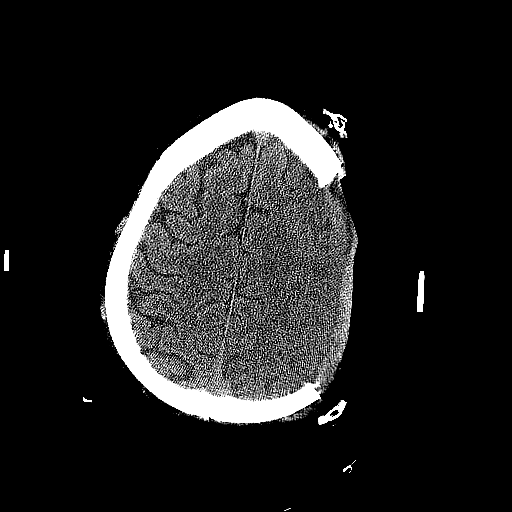
[im 53/72  brain]
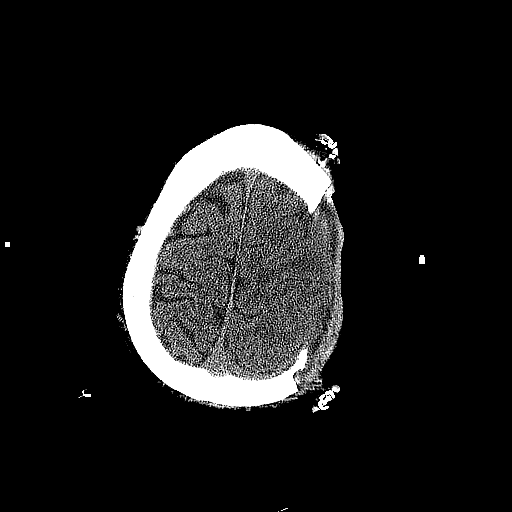
[im 60/72  brain]
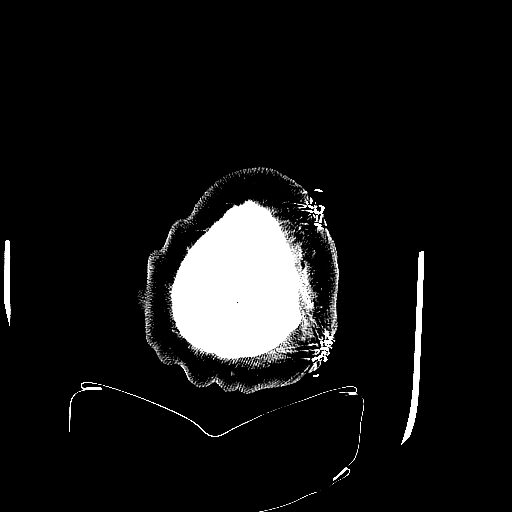
[im 60/72  bone]
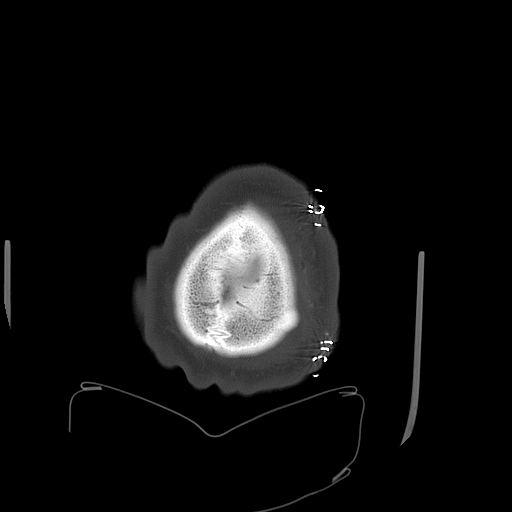
[im 64/72  brain]
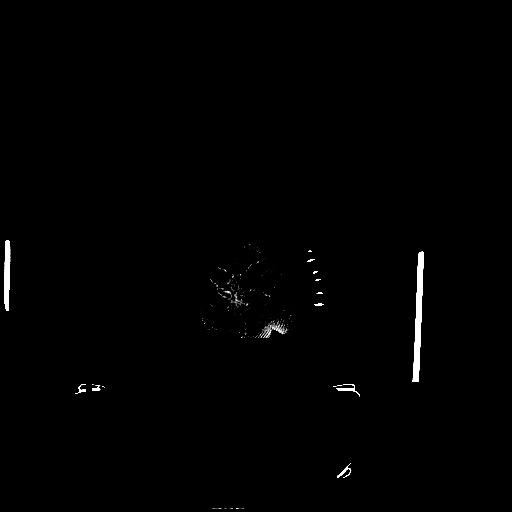
[im 68/72  brain]
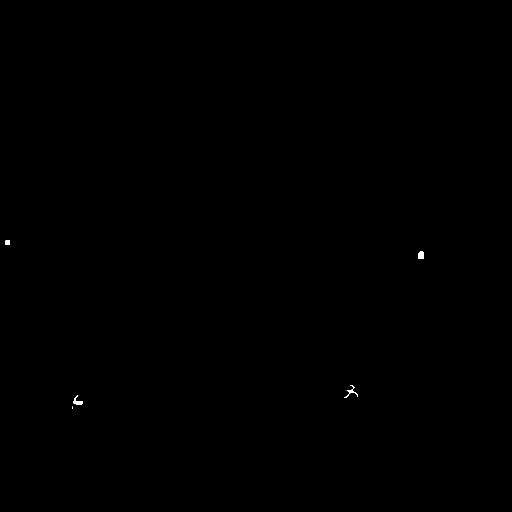

[15 of 30 positions shown; findings below may reference images not displayed]

FINDINGS: Scalp skin staples remain in place.  A percutaneous drain
along the craniectomy site has been removed since the prior study.
Decreased overlying scalp soft tissue swelling.  Trace residual
extradural gas. Stable visualized osseous structures.

Visualized orbit soft tissues are within normal limits.  Paranasal
sinuses and mastoids are stable and largely clear.

No pneumocephalus.  Decreased left hemisphere edema as evidenced by
left protrusion of brain material through the craniectomy site.
Small volume of residual now hypodense extra-axial blood or fluid
along the left anterior frontal convexity.  No residual hyperdense
intracranial blood products identified.  No ventriculomegaly.

There is mild rightward midline shift now, 3-4 mm.  Basilar
cisterns remain patent.

Interval evolved medial left thalamic and left PCA territory
infarcts (series 2 image 16).  No definite left MCA or ACA
territory ischemic changes.  Stable and normal gray-white matter
differentiation in the right hemisphere and posterior fossa. No
suspicious intracranial vascular hyperdensity.
IMPRESSION: 1.  Interval resolved hyperdense intracranial blood products and
overall decreased left hemisphere edema.
2.  At the same time interval evolution of right thalamic and right
PCA territory infarcts.  Mild rightward midline shift measuring 3-4
mm.
3.  Small volume residual extra-axial fluid along the left frontal
convexity.
4.  Craniectomy changes and interval removal craniectomy site
drain.

## 2013-10-20 ENCOUNTER — Ambulatory Visit: Payer: 59 | Admitting: Physical Therapy

## 2013-10-20 ENCOUNTER — Ambulatory Visit: Payer: 59 | Admitting: Occupational Therapy

## 2013-10-21 ENCOUNTER — Telehealth: Payer: Self-pay

## 2013-10-21 NOTE — Telephone Encounter (Signed)
Contacted patient's wife back, and informed her to PCP patient's PCP regarding his cough.

## 2013-10-21 NOTE — Telephone Encounter (Signed)
Patients wife called concerned about a cough he has.

## 2013-10-22 ENCOUNTER — Emergency Department (HOSPITAL_COMMUNITY): Payer: 59

## 2013-10-22 ENCOUNTER — Inpatient Hospital Stay (HOSPITAL_COMMUNITY)
Admission: EM | Admit: 2013-10-22 | Discharge: 2013-10-24 | DRG: 066 | Disposition: A | Discharge status: Home / Self Care | Payer: 59 | Attending: Neurosurgery | Admitting: Neurosurgery

## 2013-10-22 ENCOUNTER — Telehealth: Payer: Self-pay

## 2013-10-22 ENCOUNTER — Ambulatory Visit: Payer: 59 | Admitting: Occupational Therapy

## 2013-10-22 ENCOUNTER — Ambulatory Visit: Payer: 59 | Admitting: Physical Therapy

## 2013-10-22 ENCOUNTER — Encounter (HOSPITAL_COMMUNITY): Payer: Self-pay | Admitting: Emergency Medicine

## 2013-10-22 DIAGNOSIS — R4701 Aphasia: Secondary | ICD-10-CM | POA: Diagnosis present

## 2013-10-22 DIAGNOSIS — S065X9A Traumatic subdural hemorrhage with loss of consciousness of unspecified duration, initial encounter: Secondary | ICD-10-CM | POA: Diagnosis present

## 2013-10-22 DIAGNOSIS — Z87820 Personal history of traumatic brain injury: Secondary | ICD-10-CM

## 2013-10-22 DIAGNOSIS — J111 Influenza due to unidentified influenza virus with other respiratory manifestations: Secondary | ICD-10-CM | POA: Diagnosis present

## 2013-10-22 DIAGNOSIS — I6200 Nontraumatic subdural hemorrhage, unspecified: Principal | ICD-10-CM | POA: Diagnosis present

## 2013-10-22 DIAGNOSIS — J069 Acute upper respiratory infection, unspecified: Secondary | ICD-10-CM | POA: Diagnosis present

## 2013-10-22 DIAGNOSIS — R569 Unspecified convulsions: Secondary | ICD-10-CM | POA: Diagnosis present

## 2013-10-22 DIAGNOSIS — R29898 Other symptoms and signs involving the musculoskeletal system: Secondary | ICD-10-CM | POA: Diagnosis present

## 2013-10-22 DIAGNOSIS — Z87891 Personal history of nicotine dependence: Secondary | ICD-10-CM

## 2013-10-22 DIAGNOSIS — S065XAA Traumatic subdural hemorrhage with loss of consciousness status unknown, initial encounter: Secondary | ICD-10-CM | POA: Diagnosis present

## 2013-10-22 DIAGNOSIS — Z8673 Personal history of transient ischemic attack (TIA), and cerebral infarction without residual deficits: Secondary | ICD-10-CM

## 2013-10-22 LAB — PROTIME-INR
INR: 1.03 (ref 0.00–1.49)
Prothrombin Time: 13.3 seconds (ref 11.6–15.2)

## 2013-10-22 LAB — CBC
HCT: 42.3 % (ref 39.0–52.0)
Hemoglobin: 14.7 g/dL (ref 13.0–17.0)
MCH: 28.6 pg (ref 26.0–34.0)
MCHC: 34.8 g/dL (ref 30.0–36.0)
MCV: 82.3 fL (ref 78.0–100.0)
Platelets: 185 10*3/uL (ref 150–400)
RBC: 5.14 MIL/uL (ref 4.22–5.81)
RDW: 13.1 % (ref 11.5–15.5)
WBC: 4.4 10*3/uL (ref 4.0–10.5)

## 2013-10-22 LAB — APTT: aPTT: 32 seconds (ref 24–37)

## 2013-10-22 LAB — COMPREHENSIVE METABOLIC PANEL
ALT: 14 U/L (ref 0–53)
AST: 14 U/L (ref 0–37)
Albumin: 4.2 g/dL (ref 3.5–5.2)
Alkaline Phosphatase: 42 U/L (ref 39–117)
BUN: 12 mg/dL (ref 6–23)
CO2: 24 mEq/L (ref 19–32)
Calcium: 9.2 mg/dL (ref 8.4–10.5)
Chloride: 104 mEq/L (ref 96–112)
Creatinine, Ser: 0.83 mg/dL (ref 0.50–1.35)
GFR calc Af Amer: >90 mL/min (ref >90)
GFR calc non Af Amer: >90 mL/min (ref >90)
Glucose, Bld: 118 mg/dL — ABNORMAL HIGH (ref 70–99)
Potassium: 3.9 mEq/L (ref 3.7–5.3)
Sodium: 143 mEq/L (ref 137–147)
Total Bilirubin: 0.3 mg/dL (ref 0.3–1.2)
Total Protein: 7.2 g/dL (ref 6.0–8.3)

## 2013-10-22 LAB — DIFFERENTIAL
Basophils Absolute: 0.1 10*3/uL (ref 0.0–0.1)
Basophils Relative: 1 % (ref 0–1)
Eosinophils Absolute: 0.3 10*3/uL (ref 0.0–0.7)
Eosinophils Relative: 7 % — ABNORMAL HIGH (ref 0–5)
Lymphocytes Relative: 22 % (ref 12–46)
Lymphs Abs: 1 10*3/uL (ref 0.7–4.0)
Monocytes Absolute: 0.8 10*3/uL (ref 0.1–1.0)
Monocytes Relative: 18 % — ABNORMAL HIGH (ref 3–12)
Neutro Abs: 2.3 10*3/uL (ref 1.7–7.7)
Neutrophils Relative %: 52 % (ref 43–77)

## 2013-10-22 LAB — TROPONIN I: Troponin I: <0.3 ng/mL (ref <0.30)

## 2013-10-22 LAB — POCT I-STAT TROPONIN I: Troponin i, poc: 0.01 ng/mL (ref 0.00–0.08)

## 2013-10-22 MED ORDER — PANTOPRAZOLE SODIUM 40 MG IV SOLR
40.0000 mg | Freq: Every day | INTRAVENOUS | Status: DC
  Administered 2013-10-23: 40 mg via INTRAVENOUS
  Filled 2013-10-22 (×2): qty 40

## 2013-10-22 MED ORDER — SENNOSIDES-DOCUSATE SODIUM 8.6-50 MG PO TABS
1.0000 | ORAL_TABLET | Freq: Two times a day (BID) | ORAL | Status: DC
  Administered 2013-10-23 – 2013-10-24 (×2): 1 via ORAL
  Filled 2013-10-22 (×5): qty 1

## 2013-10-22 MED ORDER — LEVETIRACETAM 750 MG PO TABS
750.0000 mg | ORAL_TABLET | Freq: Two times a day (BID) | ORAL | Status: DC
  Administered 2013-10-23: 750 mg via ORAL
  Filled 2013-10-22 (×3): qty 1

## 2013-10-22 MED ORDER — ACETAMINOPHEN 325 MG PO TABS
650.0000 mg | ORAL_TABLET | ORAL | Status: DC | PRN
  Administered 2013-10-23 (×3): 650 mg via ORAL
  Filled 2013-10-22 (×3): qty 2

## 2013-10-22 MED ORDER — SODIUM CHLORIDE 0.9 % IV SOLN
INTRAVENOUS | Status: DC
  Administered 2013-10-23 (×2): via INTRAVENOUS

## 2013-10-22 MED ORDER — HYDROCODONE-HOMATROPINE 5-1.5 MG/5ML PO SYRP: 5.0000 mL | ORAL_SOLUTION | Freq: Four times a day (QID) | ORAL | Status: DC | PRN

## 2013-10-22 MED ORDER — ACETAMINOPHEN 650 MG RE SUPP: 650.0000 mg | RECTAL | Status: DC | PRN

## 2013-10-22 MED ORDER — DONEPEZIL HCL 10 MG PO TABS
10.0000 mg | ORAL_TABLET | Freq: Every day | ORAL | Status: DC
  Administered 2013-10-23: 10 mg via ORAL
  Filled 2013-10-22 (×2): qty 1

## 2013-10-22 MED ORDER — METHYLPHENIDATE HCL ER (LA) 10 MG PO CP24: 30.0000 mg | ORAL_CAPSULE | ORAL | Status: DC

## 2013-10-22 MED ORDER — MORPHINE SULFATE 2 MG/ML IJ SOLN
1.0000 mg | INTRAMUSCULAR | Status: DC | PRN
  Administered 2013-10-22 – 2013-10-23 (×5): 4 mg via INTRAVENOUS
  Administered 2013-10-23: 2 mg via INTRAVENOUS
  Filled 2013-10-22 (×4): qty 2
  Filled 2013-10-22: qty 1
  Filled 2013-10-22: qty 2

## 2013-10-22 MED ORDER — AMOXICILLIN-POT CLAVULANATE 875-125 MG PO TABS
1.0000 | ORAL_TABLET | Freq: Two times a day (BID) | ORAL | Status: DC
  Administered 2013-10-23 – 2013-10-24 (×4): 1 via ORAL
  Filled 2013-10-22 (×5): qty 1

## 2013-10-22 MED ORDER — METHYLPHENIDATE HCL ER (LA) 10 MG PO CP24
30.0000 mg | ORAL_CAPSULE | ORAL | Status: DC
  Administered 2013-10-23 – 2013-10-24 (×2): 30 mg via ORAL
  Filled 2013-10-22 (×2): qty 3

## 2013-10-22 NOTE — H&P (Signed)
Reason for Consult:L SDH Referring Physician: EDP  Albert [name] is an 38 y.o. male.   HPI:  38 yo WM s/p L Craniotomy several months ago who presents with headache. Has chronic headaches but this one a little more severe. No change in MS or N/T/W. Has current URI. Head CT with hyperdense fluid under crani site. MRI suggestive of blood. No history of recent trauma or seizure.  Past Medical History  Diagnosis Date  . Headache(784.0)   . Traumatic brain injury   . 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 Rotator cuff injury     right  . Stroke     some aphasia , weakness in the R arm , Out pt. rehab currently   . Swallowing problem     still relearning controlled swallowing - rec'ing speech therapy in rehab    Past Surgical History  Procedure Laterality Date  . Craniotomy Left 12/15/2012    Procedure: CRANIECTOMY HEMATOMA EVACUATION SUBDURAL WITH PLACEMENT OF BONE FLAP IN ABDOMINAL WALL;  Surgeon: Floyce Stakes, MD;  Location: [facility];  Service: Neurosurgery;  Laterality: Left;  . Craniotomy Left 12/28/2012    Procedure: CRANIOTOMY HEMATOMA EVACUATION SUBDURAL;  Surgeon: Erline Levine, MD;  Location: [facility];  Service: Neurosurgery;  Laterality: Left;  . Peg placement N/A 12/31/2012    Procedure: PERCUTANEOUS ENDOSCOPIC GASTROSTOMY (PEG) PLACEMENT;  Surgeon: Zenovia Jarred, MD;  Location: [place];  Service: General;  Laterality: N/A;  bedside  peg  . Percutaneous tracheostomy N/A 12/31/2012    Procedure: PERCUTANEOUS TRACHEOSTOMY (BEDSIDE);  Surgeon: Zenovia Jarred, MD;  Location: [place];  Service: General;  Laterality: N/A;  . Wrist surgery Right 2008  . Cranioplasty N/A 06/10/2013    Procedure: CRANIOPLASTY;  Surgeon: Floyce Stakes, MD;  Location: [facility];  Service: Neurosurgery;  Laterality: N/A;  CRANIOPLASTY    Allergies  Allergen Reactions  . Vancomycin Rash  . Zosyn [Piperacillin Sod-Tazobactam So] Rash    History  Substance Use Topics  . Smoking status: Former  Smoker    Types: Cigarettes    Quit date: 12/17/1999  . Smokeless tobacco: Never Used  . Alcohol Use: 0.0 oz/week    Family History  Problem Relation Age of Onset  . Heart disease Father      Review of Systems  Positive ROS: URI  All other systems have been reviewed and were otherwise negative with the exception of those mentioned in the HPI and as above.  Objective: Vital signs in last 24 hours: Temp:  [98 F (36.7 C)-98.2 F (36.8 C)] 98.2 F (36.8 C) (02/11 1802) Pulse Rate:  [68-92] 71 (02/11 2115) Resp:  [12-19] 19 (02/11 2115) BP: (117-130)/(63-86) 123/85 mmHg (02/11 2115) SpO2:  [94 %-98 %] 98 % (02/11 2115) Weight:  [89.812 kg (198 lb)] 89.812 kg (198 lb) (02/11 1701)  General Appearance: Alert, cooperative, no distress, appears stated age Head: Normocephalic Eyes: PERRL, EOM's intact      Neck: Supple, symmetrical, trachea midline Back: Symmetric, no curvature, ROM normal, no CVA tenderness Lungs: , respirations unlabored Heart: Regular rate and rhythm   NEUROLOGIC:   Mental status: A&O x4, no aphasia, good attention span, Memory and fund of knowledge Motor Exam - grossly normal, normal tone and bulk maybe mild weakness R compared to L Sensory Exam - grossly normal Coordination - grossly normal Gait - not tested Balance - not tested Cranial Nerves: I: smell Not tested  II: visual acuity  OS: na  OD:na   II: visual fields Full to confrontation  II: pupils Equal, round, reactive to light  III,VII: ptosis None  III,IV,VI: extraocular muscles  Full ROM  V: mastication Normal  V: facial light touch sensation  Normal  V,VII: corneal reflex  Present  VII: facial muscle function - upper  Normal  VII: facial muscle function - lower Normal  VIII: hearing Not tested  IX: soft palate elevation  Normal  IX,X: gag reflex Present  XI: trapezius strength  5/5  XI: sternocleidomastoid strength 5/5  XI: neck flexion strength  5/5  XII: tongue strength  Normal     Data Review Lab Results  Component Value Date   WBC 4.4 10/22/2013   HGB 14.7 10/22/2013   HCT 42.3 10/22/2013   MCV 82.3 10/22/2013   PLT 185 10/22/2013   Lab Results  Component Value Date   NA 143 10/22/2013   K 3.9 10/22/2013   CL 104 10/22/2013   CO2 24 10/22/2013   BUN 12 10/22/2013   CREATININE 0.83 10/22/2013   GLUCOSE 118* 10/22/2013   Lab Results  Component Value Date   INR 1.03 10/22/2013    Radiology: Ct Head Wo Contrast  10/22/2013   CLINICAL DATA:  Headache, nonproductive cough, generalized weakness, history traumatic brain injury, seizures, stroke  EXAM: CT HEAD WITHOUT CONTRAST  TECHNIQUE: Contiguous axial images were obtained from the base of the skull through the vertex without intravenous contrast.  COMPARISON:  02/18/2013  FINDINGS: Prior left parietal craniotomy and interval cranioplasty.  Mild atrophy.  Stable ventricular morphology with mild dilatation of the occipital horn of left lateral ventricle unchanged.  No midline shift.  Small high attenuation acute left parietal subdural collection 5 mm thick versus chronic dural thickening related to prior trauma and surgery.  Old left occipital PCA territory and anterior left temporal infarcts.  No acute infarction or mass lesion.  Remaining bones and sinuses unremarkable.  IMPRESSION: Interval left parietal cranioplasty.  5 mm thick left parietal subdural high attenuation collection question acute subdural hematoma versus chronic dural thickening related to prior trauma and surgery ; the left parietal dura appeared thickened on the previous exam.  Old left occipital and anterior left temporal infarcts.  No additional intracranial abnormalities.  Critical Value/emergent results were called by telephone at the time of interpretation on 10/22/2013 at 5:46 PM to Dr. Ezequiel Essex , who verbally acknowledged these results.   Electronically Signed   By: Lavonia Dana M.D.   On: 10/22/2013 17:47   Mr Brain Wo Contrast  10/22/2013    CLINICAL DATA:  38 year old male with history of Severe April 2014 traumatic brain injury requiring left craniectomy. Status post subsequent cranioplasty. Presented with headache, weakness and found to have left subdural hematoma. Initial encounter.  EXAM: MRI HEAD WITHOUT CONTRAST  TECHNIQUE: Multiplanar, multiecho pulse sequences of the brain and surrounding structures were obtained without intravenous contrast.  COMPARISON:  Head CT without contrast 10/22/2013 and earlier.  FINDINGS: Left convexity extra-axial collection measuring up to 6 mm in thickness does most resemble subdural blood. Mild rightward midline shift of 3 mm. Overlying craniotomy changes.  Superimposed chronic left PCA territory infarct. Sequelae of bilateral temporal lobe hemorrhagic contusions, with associated scattered chronic bilateral temporal lobe encephalomalacia. Sequelae of left thalamic lacunar infarct.  No restricted diffusion or evidence of acute infarction. Major intracranial vascular flow voids are stable. Basilar cisterns are normal. No acute cerebral edema identified. Outside of the above findings, gray and white matter signal is within  normal limits. No intraventricular hemorrhage. No ventriculomegaly. Negative pituitary, cervicomedullary junction and visualized cervical spine. Visible internal auditory structures appear normal. Mastoids are clear. Minor paranasal sinus mucosal thickening. Visualized orbit soft tissues are within normal limits. Normal bone marrow signal.  IMPRESSION: 1. 6 mm thick left subdural collection most compatible with subdural hematoma. Mild associated mass effect, with 3 mm of rightward midline shift. 2. Underlying chronic posttraumatic, post ischemic, and postoperative changes. No other acute intracranial abnormality.   Electronically Signed   By: Lars Pinks M.D.   On: 10/22/2013 20:36   Dg Chest Portable 1 View  10/22/2013   CLINICAL DATA:  Dry cough and headache since 10/18/2013, former smoker   EXAM: PORTABLE CHEST - 1 VIEW  COMPARISON:  Portable exam 1843 hr compared to 01/03/2013  FINDINGS: Normal heart size, mediastinal contours, and pulmonary vascularity.  Numerous EKG leads project over chest.  Lungs grossly clear.  No pleural effusion or pneumothorax.  Bones unremarkable.  Tracheostomy tube and left arm PICC line seen on previous exam no longer identified.  IMPRESSION: No acute abnormalities.   Electronically Signed   By: Lavonia Dana M.D.   On: 10/22/2013 18:52     Assessment/Plan:   Possible L SDH under craniotomy site, minimal mass effect, no need for surgery. Admit for observation.   Qiana Landgrebe S 10/22/2013 9:43 PM

## 2013-10-22 NOTE — Telephone Encounter (Signed)
If he's not having any other symptoms than headache and cold symptoms, then this very well may be due to the cold. They may try the narcotic cough medication to see if that give him some relief all around. Also, I would decrease the aricept back down to 10mg  as the aricept can cause headaches.- If headache doesn't improve over the next couple days, I would advise MD assessment

## 2013-10-22 NOTE — ED Notes (Signed)
Pt in gown, on monitor, continuous bp cuff and pulse ox, family at bedside

## 2013-10-22 NOTE — ED Notes (Signed)
Pt transported to MRI 

## 2013-10-22 NOTE — ED Notes (Addendum)
C/o headache and non-productive cough since yesterday.  Denies fever, body aches, nausea, vomiting, diarrhea. Reports generalized weakness. States he took Tylenol for headache this morning without relief.

## 2013-10-22 NOTE — ED Notes (Addendum)
Attempted report 

## 2013-10-22 NOTE — ED Notes (Signed)
MD at bedside. 

## 2013-10-22 NOTE — ED Provider Notes (Signed)
CSN: [id number]     Arrival date & time 10/22/13  1655 History   First MD Initiated Contact with Patient 10/22/13 1757     Chief Complaint  Patient presents with  . Headache  . Cough     (Consider location/radiation/quality/duration/timing/severity/associated sxs/prior Treatment) HPI Comments: Patient with four-day history of progressively worsening headache associated with cough and congestion. He was seen at urgent care yesterday and diagnosed with a URI and started on Mucinex and Augmentin. Today the headache is gotten worse on the left side of his head and described as splitting. Is associated with some nausea and photophobia. Patient denies any weakness, numbness or tingling. Denies any fever. Denies any visual change. Wife denies any behavior change. He said intermittent headaches since his surgery.  The history is provided by the patient and the spouse.    Past Medical History  Diagnosis Date  . Headache(784.0)   . Traumatic brain injury   . 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 Rotator cuff injury     right  . Stroke     some aphasia , weakness in the R arm , Out pt. rehab currently   . Swallowing problem     still relearning controlled swallowing - rec'ing speech therapy in rehab   Past Surgical History  Procedure Laterality Date  . Craniotomy Left 12/15/2012    Procedure: CRANIECTOMY HEMATOMA EVACUATION SUBDURAL WITH PLACEMENT OF BONE FLAP IN ABDOMINAL WALL;  Surgeon: Floyce Stakes, MD;  Location: [facility];  Service: Neurosurgery;  Laterality: Left;  . Craniotomy Left 12/28/2012    Procedure: CRANIOTOMY HEMATOMA EVACUATION SUBDURAL;  Surgeon: Erline Levine, MD;  Location: [place] [facility];  Service: Neurosurgery;  Laterality: Left;  . Peg placement N/A 12/31/2012    Procedure: PERCUTANEOUS ENDOSCOPIC GASTROSTOMY (PEG) PLACEMENT;  Surgeon: Zenovia Jarred, MD;  Location: [facility];  Service: General;  Laterality: N/A;  bedside  peg  . Percutaneous tracheostomy N/A 12/31/2012   Procedure: PERCUTANEOUS TRACHEOSTOMY (BEDSIDE);  Surgeon: Zenovia Jarred, MD;  Location: [facility];  Service: General;  Laterality: N/A;  . Wrist surgery Right 2008  . Cranioplasty N/A 06/10/2013    Procedure: CRANIOPLASTY;  Surgeon: Floyce Stakes, MD;  Location: [facility];  Service: Neurosurgery;  Laterality: N/A;  CRANIOPLASTY   Family History  Problem Relation Age of Onset  . Heart disease Father    History  Substance Use Topics  . Smoking status: Former Smoker    Types: Cigarettes    Quit date: 12/17/1999  . Smokeless tobacco: Never Used  . Alcohol Use: 0.0 oz/week    Review of Systems  Constitutional: Positive for fatigue. Negative for fever, activity change and appetite change.  HENT: Positive for congestion and rhinorrhea.   Respiratory: Positive for cough.   Cardiovascular: Negative for chest pain.  Gastrointestinal: Positive for nausea. Negative for vomiting and abdominal pain.  Genitourinary: Negative for dysuria and hematuria.  Musculoskeletal: Negative for back pain and joint swelling.  Skin: Negative for rash.  Neurological: Positive for dizziness and headaches. Negative for weakness.  A complete 10 system review of systems was obtained and all systems are negative except as noted in the HPI and PMH.      Allergies  Vancomycin and Zosyn  Home Medications   Current Outpatient Rx  Name  Route  Sig  Dispense  Refill  . acetaminophen (TYLENOL) 500 MG chewable tablet   Oral   Chew 1,000 mg by mouth every 6 (six) hours as needed  for pain.         Marland Kitchen amoxicillin-clavulanate (AUGMENTIN) 875-125 MG per tablet   Oral   Take 1 tablet by mouth 2 (two) times daily. Started medication on 10-21-13         . donepezil (ARICEPT) 10 MG tablet   Oral   Take 1 tablet (10 mg total) by mouth at bedtime.   30 tablet   3   . HYDROcodone-homatropine (HYDROMET) 5-1.5 MG/5ML syrup   Oral   Take 5 mLs by mouth every 6 (six) hours as needed for cough.         .  levETIRAcetam (KEPPRA) 750 MG tablet      TAKE 2 TABLETS BY MOUTH TWICE DAILY   120 tablet   11   . methylphenidate (RITALIN LA) 30 MG 24 hr capsule   Oral   Take 1 capsule (30 mg total) by mouth every morning.   30 capsule   0   . Multiple Vitamin (MULTIVITAMIN WITH MINERALS) TABS   Oral   Take 1 tablet by mouth daily.         . Omega-3 Fatty Acids ([employer] OMEGA-3 GUMMY FISH/DHA) 113.5 MG CHEW   Oral   Chew 1 tablet by mouth daily.           BP 129/81  Pulse 73  Temp(Src) 98.2 F (36.8 C) (Oral)  Resp 13  Wt 198 lb (89.812 kg)  SpO2 97% Physical Exam  Constitutional: He is oriented to person, place, and time. He appears well-developed and well-nourished. No distress.  HENT:  Head: Normocephalic and atraumatic.  Mouth/Throat: Oropharynx is clear and moist. No oropharyngeal exudate.  No sinus tenderness  Eyes: Conjunctivae and EOM are normal. Pupils are equal, round, and reactive to light.  Neck: Normal range of motion. Neck supple.  No meningismus  Cardiovascular: Normal rate, regular rhythm and normal heart sounds.   No murmur heard. Pulmonary/Chest: Effort normal and breath sounds normal. No respiratory distress.  Abdominal: Soft. There is no tenderness. There is no rebound and no guarding.  Musculoskeletal: Normal range of motion. He exhibits no edema and no tenderness.  Neurological: He is alert and oriented to person, place, and time. No cranial nerve deficit. He exhibits normal muscle tone. Coordination normal.  CN 2-12 intact, no ataxia on finger to nose, no nystagmus, 5/5 strength throughout, no pronator drift, Romberg negative, normal gait.   Skin: Skin is warm.    ED Course  Procedures (including critical care time) Labs Review Labs Reviewed  DIFFERENTIAL - Abnormal; Notable for the following:    Monocytes Relative 18 (*)    Eosinophils Relative 7 (*)    All other components within normal limits  COMPREHENSIVE METABOLIC PANEL - Abnormal; Notable for  the following:    Glucose, Bld 118 (*)    All other components within normal limits  PROTIME-INR  APTT  CBC  TROPONIN I  POCT I-STAT TROPONIN I   Imaging Review Ct Head Wo Contrast  10/22/2013   CLINICAL DATA:  Headache, nonproductive cough, generalized weakness, history traumatic brain injury, seizures, stroke  EXAM: CT HEAD WITHOUT CONTRAST  TECHNIQUE: Contiguous axial images were obtained from the base of the skull through the vertex without intravenous contrast.  COMPARISON:  02/18/2013  FINDINGS: Prior left parietal craniotomy and interval cranioplasty.  Mild atrophy.  Stable ventricular morphology with mild dilatation of the occipital horn of left lateral ventricle unchanged.  No midline shift.  Small high attenuation acute left parietal subdural collection  5 mm thick versus chronic dural thickening related to prior trauma and surgery.  Old left occipital PCA territory and anterior left temporal infarcts.  No acute infarction or mass lesion.  Remaining bones and sinuses unremarkable.  IMPRESSION: Interval left parietal cranioplasty.  5 mm thick left parietal subdural high attenuation collection question acute subdural hematoma versus chronic dural thickening related to prior trauma and surgery ; the left parietal dura appeared thickened on the previous exam.  Old left occipital and anterior left temporal infarcts.  No additional intracranial abnormalities.  Critical Value/emergent results were called by telephone at the time of interpretation on 10/22/2013 at 5:46 PM to Dr. Ezequiel Essex , who verbally acknowledged these results.   Electronically Signed   By: Lavonia Dana M.D.   On: 10/22/2013 17:47   Mr Brain Wo Contrast  10/22/2013   CLINICAL DATA:  38 year old male with history of Severe April 2014 traumatic brain injury requiring left craniectomy. Status post subsequent cranioplasty. Presented with headache, weakness and found to have left subdural hematoma. Initial encounter.  EXAM: MRI HEAD  WITHOUT CONTRAST  TECHNIQUE: Multiplanar, multiecho pulse sequences of the brain and surrounding structures were obtained without intravenous contrast.  COMPARISON:  Head CT without contrast 10/22/2013 and earlier.  FINDINGS: Left convexity extra-axial collection measuring up to 6 mm in thickness does most resemble subdural blood. Mild rightward midline shift of 3 mm. Overlying craniotomy changes.  Superimposed chronic left PCA territory infarct. Sequelae of bilateral temporal lobe hemorrhagic contusions, with associated scattered chronic bilateral temporal lobe encephalomalacia. Sequelae of left thalamic lacunar infarct.  No restricted diffusion or evidence of acute infarction. Major intracranial vascular flow voids are stable. Basilar cisterns are normal. No acute cerebral edema identified. Outside of the above findings, gray and white matter signal is within normal limits. No intraventricular hemorrhage. No ventriculomegaly. Negative pituitary, cervicomedullary junction and visualized cervical spine. Visible internal auditory structures appear normal. Mastoids are clear. Minor paranasal sinus mucosal thickening. Visualized orbit soft tissues are within normal limits. Normal bone marrow signal.  IMPRESSION: 1. 6 mm thick left subdural collection most compatible with subdural hematoma. Mild associated mass effect, with 3 mm of rightward midline shift. 2. Underlying chronic posttraumatic, post ischemic, and postoperative changes. No other acute intracranial abnormality.   Electronically Signed   By: Lars Pinks M.D.   On: 10/22/2013 20:36   Dg Chest Portable 1 View  10/22/2013   CLINICAL DATA:  Dry cough and headache since 10/18/2013, former smoker  EXAM: PORTABLE CHEST - 1 VIEW  COMPARISON:  Portable exam 1843 hr compared to 01/03/2013  FINDINGS: Normal heart size, mediastinal contours, and pulmonary vascularity.  Numerous EKG leads project over chest.  Lungs grossly clear.  No pleural effusion or pneumothorax.   Bones unremarkable.  Tracheostomy tube and left arm PICC line seen on previous exam no longer identified.  IMPRESSION: No acute abnormalities.   Electronically Signed   By: Lavonia Dana M.D.   On: 10/22/2013 18:52    EKG Interpretation   None       MDM   Final diagnoses:  SDH (subdural hematoma)    Patient presents with worsening headache since yesterday. Has an ongoing upper respiratory infection. History of previous craniotomy. Neurologically intact.  No fever.  CT shows possible sub dural fluid collection.  Blood versus post surgical change.  D/w Dr. Ronnald Ramp of neurosurgery who recommends MRI.  MRI shows likely subdural hematoma. Minimal midline shift. Patient remains neurologically intact. Blood pressure stable.  Discussed with Dr.  Jones who will admit for observation.  Ezequiel Essex, MD 10/22/13 [phone]

## 2013-10-22 NOTE — Telephone Encounter (Signed)
Patient's wife called and stated patient was seen yesterday @ the Urgent Care for a cold. He was given Amoxicillin, and a narcotic cough med. He has also been taking Mucinex DM. Patient has been having a constant headache since yesterday morning. Wife said he has been taking 1000 mg of Tylenol every 4 hours and it is not helping at all. She wanted to know if she should take him to the ER or if the headache could possibly be from the cold?

## 2013-10-22 NOTE — Telephone Encounter (Signed)
Patient wife called regarding patient having a splitting headache.  He has has it without any relief.  Advised them to go to emergency room.  Patient wife agreed.

## 2013-10-23 ENCOUNTER — Encounter (HOSPITAL_COMMUNITY): Payer: Self-pay | Admitting: General Practice

## 2013-10-23 LAB — MRSA PCR SCREENING: MRSA by PCR: POSITIVE — AB

## 2013-10-23 MED ORDER — MUPIROCIN 2 % EX OINT
1.0000 "application " | TOPICAL_OINTMENT | Freq: Two times a day (BID) | CUTANEOUS | Status: DC
  Administered 2013-10-23 – 2013-10-24 (×2): 1 via NASAL
  Filled 2013-10-23: qty 22

## 2013-10-23 MED ORDER — CHLORHEXIDINE GLUCONATE CLOTH 2 % EX PADS
6.0000 | MEDICATED_PAD | Freq: Every day | CUTANEOUS | Status: DC
  Administered 2013-10-24: 6 via TOPICAL

## 2013-10-23 MED ORDER — LEVETIRACETAM 750 MG PO TABS
1500.0000 mg | ORAL_TABLET | Freq: Two times a day (BID) | ORAL | Status: DC
  Administered 2013-10-23: 750 mg via ORAL
  Administered 2013-10-23 – 2013-10-24 (×2): 1500 mg via ORAL
  Filled 2013-10-23 (×4): qty 2

## 2013-10-23 MED ORDER — PANTOPRAZOLE SODIUM 40 MG PO TBEC
40.0000 mg | DELAYED_RELEASE_TABLET | Freq: Every day | ORAL | Status: DC
  Administered 2013-10-23 – 2013-10-24 (×2): 40 mg via ORAL
  Filled 2013-10-23 (×2): qty 1

## 2013-10-23 NOTE — Progress Notes (Signed)
Stopped by to see the patient/wife. MRI unimpressive. Headache is better today. Spoke with NS as well. There is a possibility that headaches are taking place due to the increase in his aricept last month in the context of his current URI.  I have stopped his aricept for now.  Meredith Staggers, MD, [place]

## 2013-10-23 NOTE — Progress Notes (Signed)
Patient ID: Albert [name], male   DOB: [birth year], 38 y.o.   MRN: [id number] Awake, headache better. No wweakness. i did speak with rehab medicine dr and there is a possibility that his headache can be related to the increase of the aricept. Mri bran seen. No need for surgical intervention. Wife not around

## 2013-10-23 NOTE — Telephone Encounter (Signed)
Patient was taken to ER admitted and CT/MRI showed blood.  Patient is being observed in the hospital now.

## 2013-10-23 NOTE — Progress Notes (Signed)
MD Ronnald Ramp ordered to discontinue ED order for patient to be NPO due to dysphagia history; MD Ronnald Ramp ordered regular diet and oral medications

## 2013-10-23 NOTE — Telephone Encounter (Signed)
Yes, I am aware. I spoke with NS this AM. ?small SDH---will stop by and see him this morning

## 2013-10-23 NOTE — Progress Notes (Signed)
UR completed. Patient changed to inpatient- SDH

## 2013-10-24 NOTE — Discharge Summary (Signed)
Physician Discharge Summary  Patient ID: Albert [name] MRN: [id number] DOB/AGE: August 21, 1976 38 y.o.  Admit date: 10/22/2013 Discharge date: 10/24/2013  Admission Diagnoses:sp  chi  Discharge Diagnoses: chi  SDH. FLU Active Problems:   Subdural hematoma   Discharged Condition: NO HEADACHE  Hospital Course: observation  Consults:rehab medicine  Significant Diagnostic Studies: mri  Treatments: pain control  Discharge Exam: Blood pressure 123/76, pulse 62, temperature 98.4 F (36.9 C), temperature source Oral, resp. rate 18, height 6' (1.829 m), weight 90.719 kg (200 lb), SpO2 96.00%. No weakness  Disposition: 01-Home or Self Care   Future Appointments Provider Department Dept Phone   10/28/2013 11:00 AM [place] [phone]   10/30/2013 9:30 AM Harriet Masson, [place] [phone]   10/30/2013 10:15 AM [place] [phone]   11/04/2013 9:30 AM [place] [phone]   11/07/2013 9:30 AM [place] [phone]   11/11/2013 10:20 AM Meredith Staggers, [facility] [phone]   11/12/2013 10:15 AM [place] [phone]   11/14/2013 9:30 AM Barkley Boards [place] [phone]       Medication List    ASK your doctor about these medications       acetaminophen 500 MG chewable tablet  Commonly known as:  TYLENOL  Chew 1,000 mg by mouth every 6 (six) hours as needed for pain.     amoxicillin-clavulanate 875-125 MG per tablet  Commonly known as:  AUGMENTIN  Take 1 tablet by mouth 2 (two) times daily. Started  medication on 10-21-13     [facility] OMEGA-3 GUMMY FISH/DHA 113.5 MG Chew  Chew 1 tablet by mouth daily.     donepezil 10 MG tablet  Commonly known as:  ARICEPT  Take 1 tablet (10 mg total) by mouth at bedtime.     HYDROMET 5-1.5 MG/5ML syrup  Generic drug:  HYDROcodone-homatropine  Take 5 mLs by mouth every 6 (six) hours as needed for cough.     levETIRAcetam 750 MG tablet  Commonly known as:  KEPPRA  Take 1,500 mg by mouth 2 (two) times daily.     methylphenidate 30 MG 24 hr capsule  Commonly known as:  RITALIN LA  Take 1 capsule (30 mg total) by mouth every morning.     multivitamin with minerals Tabs tablet  Take 1 tablet by mouth daily.         Signed: Floyce Stakes 10/24/2013, 11:45 AM

## 2013-10-24 NOTE — Progress Notes (Signed)
Pt denies any headache and hasnt required any pain medication on my shift. Pt and wife ready for discharge and I reviewed d/c instructions with patient and wife. No further questions. D/c'd to private vehicle in wheelchair

## 2013-10-24 NOTE — Clinical Documentation Improvement (Signed)
THIS DOCUMENT IS NOT A PERMANENT PART OF THE MEDICAL RECORD  Please update your documentation with the medical record to reflect your response to this query. If you need help knowing how to do this please call [phone].  10/24/13   Dear Dr. Joya Salm,   Per H&P and Notes this patient has "mild weakness R compared to L" from a previous SDH s/p surgery. In the Coding world "weakness" is considered nonspecific and low weighted. If possible, please clarify this term to better reflect your patient's severity of illness and risk of mortality. Thank you.  Possible Clinical Conditions? - mild hemiparesis  - other (please specify)   You may use possible, probable, or suspect with inpatient documentation. possible, probable, suspected diagnoses MUST be documented at the time of discharge  Reviewed:  no additional documentation provided  Thank You,  [place] Documentation Specialist: [place]

## 2013-10-28 ENCOUNTER — Ambulatory Visit: Payer: 59 | Admitting: Occupational Therapy

## 2013-10-30 ENCOUNTER — Ambulatory Visit: Payer: 59

## 2013-10-30 ENCOUNTER — Ambulatory Visit: Payer: 59 | Admitting: Occupational Therapy

## 2013-11-04 ENCOUNTER — Ambulatory Visit: Payer: 59 | Admitting: Occupational Therapy

## 2013-11-07 ENCOUNTER — Encounter: Payer: 59 | Admitting: Occupational Therapy

## 2013-11-11 ENCOUNTER — Encounter: Payer: Self-pay | Admitting: Physical Medicine & Rehabilitation

## 2013-11-11 ENCOUNTER — Encounter: Payer: 59 | Attending: Physical Medicine & Rehabilitation | Admitting: Physical Medicine & Rehabilitation

## 2013-11-11 VITALS — BP 169/75 | HR 97 | Resp 14 | Ht 72.0 in | Wt 203.0 lb

## 2013-11-11 DIAGNOSIS — S066X9A Traumatic subarachnoid hemorrhage with loss of consciousness of unspecified duration, initial encounter: Secondary | ICD-10-CM

## 2013-11-11 DIAGNOSIS — S065XAA Traumatic subdural hemorrhage with loss of consciousness status unknown, initial encounter: Secondary | ICD-10-CM | POA: Insufficient documentation

## 2013-11-11 DIAGNOSIS — E291 Testicular hypofunction: Secondary | ICD-10-CM

## 2013-11-11 DIAGNOSIS — S069X9A Unspecified intracranial injury with loss of consciousness of unspecified duration, initial encounter: Secondary | ICD-10-CM | POA: Insufficient documentation

## 2013-11-11 DIAGNOSIS — X58XXXA Exposure to other specified factors, initial encounter: Secondary | ICD-10-CM | POA: Insufficient documentation

## 2013-11-11 DIAGNOSIS — S066X1A Traumatic subarachnoid hemorrhage with loss of consciousness of 30 minutes or less, initial encounter: Secondary | ICD-10-CM

## 2013-11-11 DIAGNOSIS — S069XAA Unspecified intracranial injury with loss of consciousness status unknown, initial encounter: Secondary | ICD-10-CM | POA: Insufficient documentation

## 2013-11-11 DIAGNOSIS — E349 Endocrine disorder, unspecified: Secondary | ICD-10-CM

## 2013-11-11 DIAGNOSIS — S065X9A Traumatic subdural hemorrhage with loss of consciousness of unspecified duration, initial encounter: Secondary | ICD-10-CM | POA: Insufficient documentation

## 2013-11-11 DIAGNOSIS — E039 Hypothyroidism, unspecified: Secondary | ICD-10-CM | POA: Insufficient documentation

## 2013-11-11 LAB — TESTOSTERONE, FREE

## 2013-11-11 LAB — T4: T4, Total: 9.2 ug/dL (ref 5.0–12.5)

## 2013-11-11 LAB — T3: T3, Total: 85 ng/dL (ref 80.0–204.0)

## 2013-11-11 MED ORDER — METHYLPHENIDATE HCL ER (LA) 30 MG PO CP24: 30.0000 mg | ORAL_CAPSULE | ORAL | Status: DC

## 2013-11-11 MED ORDER — RIVASTIGMINE TARTRATE 3 MG PO CAPS: 3.0000 mg | ORAL_CAPSULE | Freq: Two times a day (BID) | ORAL | Status: DC

## 2013-11-11 MED ORDER — PROPRANOLOL HCL 10 MG PO TABS: 10.0000 mg | ORAL_TABLET | Freq: Two times a day (BID) | ORAL | Status: DC

## 2013-11-11 MED ORDER — PROPRANOLOL HCL 10 MG PO TABS: 10.0000 mg | ORAL_TABLET | Freq: Three times a day (TID) | ORAL | Status: DC

## 2013-11-11 NOTE — Patient Instructions (Signed)
PLEASE CALL ME WITH ANY PROBLEMS OR QUESTIONS (#[phone]).     YOU MAY NOT DRIVE.  YOU ARE NOT READY TO WORK. YOU WILL NEED TO BE CLEARED BY ME FIRST BEFORE YOU RETURN TO WORK

## 2013-11-11 NOTE — Progress Notes (Signed)
Subjective:    Patient ID: Albert [name], male    DOB: Apr 16, 1976, 38 y.o.   MRN: [id number]  HPI  [name] is back regarding his TBI.  He was admitted to the hospital a few weeks back with increased headaches. MRI of the brain was stable. The headache may have been triggered by a URI. He saw NS in the hospital. I also stopped his aricept over concern that this was triggering headaches. His wife came down with the same cold and headaches as well.    They haven't noticed much difference from a cognitive standpoint off the aricept.   Currently he's taking tylenol for headaches. His headaches tend to be the worst when he wakes up in the morning.   He is still a little irritable around his 71yo daughter. His energy levels seem to be tied to his pain and mood levels as well. The 36yo continues have difficulties understanding what's going on with her dad.    Pain Inventory Average Pain 8 Pain Right Now 4 My pain is intermittent and aching  In the last 24 hours, has pain interfered with the following? General activity 5 Relation with others 7 Enjoyment of life 6 What TIME of day is your pain at its worst? all day Sleep (in general) Fair  Pain is worse with: activities Pain improves with: rest Relief from Meds: 8  Mobility walk without assistance how many minutes can you walk? 45-60 ability to climb steps?  yes do you drive?  no transfers alone Do you have any goals in this area?  yes  Function not employed: date last employed na  Neuro/Psych weakness numbness trouble walking dizziness  Prior Studies Any changes since last visit?  yes CT/MRI  Physicians involved in your care Any changes since last visit?  no   Family History  Problem Relation Age of Onset  . Heart disease Father    History   Social History  . Marital Status: Married    Spouse Name: N/A    Number of Children: N/A  . Years of Education: N/A   Social History Main Topics  . Smoking status:  Former Smoker    Types: Cigarettes    Quit date: 12/17/1999  . Smokeless tobacco: Never Used  . Alcohol Use: 0.0 oz/week  . Drug Use: No  . Sexual Activity: None   Other Topics Concern  . None   Social History Narrative  . None   Past Surgical History  Procedure Laterality Date  . Craniotomy Left 12/15/2012    Procedure: CRANIECTOMY HEMATOMA EVACUATION SUBDURAL WITH PLACEMENT OF BONE FLAP IN ABDOMINAL WALL;  Surgeon: Floyce Stakes, MD;  Location: [facility];  Service: Neurosurgery;  Laterality: Left;  . Craniotomy Left 12/28/2012    Procedure: CRANIOTOMY HEMATOMA EVACUATION SUBDURAL;  Surgeon: Erline Levine, MD;  Location: [place] [facility];  Service: Neurosurgery;  Laterality: Left;  . Peg placement N/A 12/31/2012    Procedure: PERCUTANEOUS ENDOSCOPIC GASTROSTOMY (PEG) PLACEMENT;  Surgeon: Zenovia Jarred, MD;  Location: [place];  Service: General;  Laterality: N/A;  bedside  peg  . Percutaneous tracheostomy N/A 12/31/2012    Procedure: PERCUTANEOUS TRACHEOSTOMY (BEDSIDE);  Surgeon: Zenovia Jarred, MD;  Location: [place];  Service: General;  Laterality: N/A;  . Wrist surgery Right 2008  . Cranioplasty N/A 06/10/2013    Procedure: CRANIOPLASTY;  Surgeon: Floyce Stakes, MD;  Location: [facility];  Service: Neurosurgery;  Laterality: N/A;  CRANIOPLASTY   Past Medical History  Diagnosis Date  . Headache(784.0)   . Traumatic brain injury   . 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 Rotator cuff injury     right  . Stroke     some aphasia , weakness in the R arm , Out pt. rehab currently   . Swallowing problem     still relearning controlled swallowing - rec'ing speech therapy in rehab   BP 169/75  Pulse 97  Resp 14  Ht 6' (1.829 m)  Wt 203 lb (92.08 kg)  BMI 27.53 kg/m2  SpO2 98%  Opioid Risk Score:   Fall Risk Score: Low Fall Risk (0-5 points) (pt educated on fall risk, brochure given to pt.)    Review of Systems  Musculoskeletal: Positive for gait problem.  Neurological:  Positive for dizziness, weakness and numbness.  All other systems reviewed and are negative.       Objective:   Physical Exam  General: Alert and oriented x 3, No apparent distress. Wearing helmet.  HEENT: Head is normocephalic, atraumatic, PERRLA, EOMI, sclera anicteric, oral mucosa pink and moist, dentition intact, ext ear canals clear,  Neck: Supple without JVD or lymphadenopathy  Heart: Reg rate and rhythm. No murmurs rubs or gallops  Chest: CTA bilaterally without wheezes, rales, or rhonchi; no distress  Abdomen: Soft, non-tender, non-distended, bowel sounds positive.  Extremities: No clubbing, cyanosis, or edema. Pulses are 2+  Skin: Clean and intact without signs of breakdown  Neuro: Pt recalled the month, year. Told me why he was here. Recalls events at home. Doesn't initiate much but answers quickly when spoken to. Still with STM deficits. Right HH, no central deficits appreciated. Still apraxic with the right side.  Walks favoring the right side, with a bit of a steppage pattern. Appears fairly steady on his feet. DTR's 3+ on right.  Musculoskeletal: Right shoulder with minimal tenderness  Psych: Pt's affect is flat but generally pleasant. Pt is cooperative GU: scrotum not visualized    Assessment:  1. 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 Status post craniotomy evacuation of hematoma. He has made substantial progress but still has significant cognitive deficits which affect memory, attention, energy levels, focus, higher level cognition. 2. Old right shoulder injury, likely RTC tendonitis--improved  3. Seizure post- traumatic  4. Scrotal swelling, pain----?dermatitis  5. Recent headaches. ?post-traumatic, exacerbated by URI   Plan:  1. Add low dose propranolol for headache and mood. Watch bp and energy levels. Consider adjusting keppra for headache or using another anticonvulsant 2. Will try exelon 3mg  BID for memory.  3.  continue ritalin LA 30mg  daily for energy levels.  4. Continue keppra 750mg  bid for 12 months post injury. Consider weaning a bit further in January.  5. Hormonal work up today with thyroid, [place], and testosterone tested 6. Follow up with me in 2 months. 30 minutes of face to face patient care time was spent during this visit.  Reviewed the fact that he is pre-vocational and unable to drive at this point.

## 2013-11-12 ENCOUNTER — Telehealth: Payer: Self-pay

## 2013-11-12 ENCOUNTER — Ambulatory Visit: Payer: 59 | Attending: Physical Medicine & Rehabilitation | Admitting: Occupational Therapy

## 2013-11-12 DIAGNOSIS — R262 Difficulty in walking, not elsewhere classified: Secondary | ICD-10-CM | POA: Insufficient documentation

## 2013-11-12 DIAGNOSIS — R4701 Aphasia: Secondary | ICD-10-CM | POA: Insufficient documentation

## 2013-11-12 DIAGNOSIS — R41842 Visuospatial deficit: Secondary | ICD-10-CM | POA: Insufficient documentation

## 2013-11-12 DIAGNOSIS — R4189 Other symptoms and signs involving cognitive functions and awareness: Secondary | ICD-10-CM | POA: Insufficient documentation

## 2013-11-12 DIAGNOSIS — M6281 Muscle weakness (generalized): Secondary | ICD-10-CM | POA: Insufficient documentation

## 2013-11-12 DIAGNOSIS — M2420 Disorder of ligament, unspecified site: Secondary | ICD-10-CM | POA: Insufficient documentation

## 2013-11-12 DIAGNOSIS — R279 Unspecified lack of coordination: Secondary | ICD-10-CM | POA: Insufficient documentation

## 2013-11-12 DIAGNOSIS — Z5189 Encounter for other specified aftercare: Secondary | ICD-10-CM | POA: Insufficient documentation

## 2013-11-12 DIAGNOSIS — M629 Disorder of muscle, unspecified: Secondary | ICD-10-CM | POA: Insufficient documentation

## 2013-11-12 LAB — GROWTH HORMONE: Growth Hormone: <0.1 ng/mL (ref 0.00–3.00)

## 2013-11-12 LAB — TESTOSTERONE, FREE, TOTAL, SHBG
Sex Hormone Binding: 28 nmol/L (ref 13–71)
Testosterone, Free: 52.8 pg/mL (ref 47.0–244.0)
Testosterone-% Free: 2.1 % (ref 1.6–2.9)
Testosterone: 249 ng/dL — ABNORMAL LOW (ref 300–890)

## 2013-11-12 LAB — INSULIN-LIKE GROWTH FACTOR: Somatomedin (IGF-I): 189 ng/mL (ref 73–330)

## 2013-11-12 MED ORDER — METHYLPHENIDATE HCL ER (OSM) 27 MG PO TBCR: 27.0000 mg | EXTENDED_RELEASE_TABLET | ORAL | Status: DC

## 2013-11-12 NOTE — Telephone Encounter (Signed)
Erroneous encounter

## 2013-11-12 NOTE — Telephone Encounter (Signed)
yes

## 2013-11-12 NOTE — Telephone Encounter (Signed)
Patient request was sent over to changes patient's Ritalin to Concerta or Vyvanse due to insurance not covering the Ritalin 30 mg ER caps. Please advise.

## 2013-11-12 NOTE — Telephone Encounter (Signed)
Contacted patient's wife to inform her that due to insurance patient's Ritalin had to be changed to Concerta 27 mg. She was wondering if she could get 2 months RX for patient like they did with Ritalin?

## 2013-11-14 ENCOUNTER — Telehealth: Payer: Self-pay

## 2013-11-14 ENCOUNTER — Ambulatory Visit: Payer: 59 | Admitting: Occupational Therapy

## 2013-11-14 ENCOUNTER — Other Ambulatory Visit: Payer: Self-pay

## 2013-11-14 MED ORDER — METHYLPHENIDATE HCL ER (OSM) 27 MG PO TBCR: 27.0000 mg | EXTENDED_RELEASE_TABLET | ORAL | Status: DC

## 2013-11-14 MED ORDER — TESTOSTERONE 2.5 MG/24HR TD PT24: 1.0000 | MEDICATED_PATCH | Freq: Every day | TRANSDERMAL | Status: DC

## 2013-11-14 MED ORDER — TESTOSTERONE 2 MG/24HR TD PT24: 2.0000 mg | MEDICATED_PATCH | Freq: Every day | TRANSDERMAL | Status: DC

## 2013-11-14 NOTE — Telephone Encounter (Signed)
2mg  is fine. Someone needs to correct dose on Epic-----

## 2013-11-14 NOTE — Telephone Encounter (Signed)
Per Dr Naaman Plummer since this concerta is a change he wants him to come in next month.  Patient aware.

## 2013-11-14 NOTE — Telephone Encounter (Signed)
Second [employer].  Androderm called into [facility].

## 2013-11-14 NOTE — Telephone Encounter (Signed)
Corrected

## 2013-11-14 NOTE — Telephone Encounter (Signed)
Pharmacy called and stated that Androderm does not come is 2.5 mg. Pharmacist said it is only available in 2 mg and 4 mg patches. Please advise.

## 2013-11-20 ENCOUNTER — Ambulatory Visit: Payer: 59 | Admitting: Physical Therapy

## 2013-11-20 ENCOUNTER — Ambulatory Visit: Payer: 59 | Admitting: Occupational Therapy

## 2013-11-21 ENCOUNTER — Encounter: Payer: 59 | Admitting: Occupational Therapy

## 2013-11-21 ENCOUNTER — Ambulatory Visit: Payer: 59 | Admitting: Physical Therapy

## 2013-11-26 ENCOUNTER — Encounter: Payer: 59 | Admitting: Occupational Therapy

## 2013-11-28 ENCOUNTER — Ambulatory Visit: Payer: 59 | Admitting: Occupational Therapy

## 2013-11-28 DIAGNOSIS — F09 Unspecified mental disorder due to known physiological condition: Secondary | ICD-10-CM

## 2013-11-28 DIAGNOSIS — F4323 Adjustment disorder with mixed anxiety and depressed mood: Secondary | ICD-10-CM

## 2013-11-28 DIAGNOSIS — X58XXXA Exposure to other specified factors, initial encounter: Secondary | ICD-10-CM

## 2013-11-28 DIAGNOSIS — S069X5A Unspecified intracranial injury with loss of consciousness greater than 24 hours with return to pre-existing conscious level, initial encounter: Secondary | ICD-10-CM

## 2013-12-10 DIAGNOSIS — S069X5A Unspecified intracranial injury with loss of consciousness greater than 24 hours with return to pre-existing conscious level, initial encounter: Secondary | ICD-10-CM

## 2013-12-10 DIAGNOSIS — X58XXXA Exposure to other specified factors, initial encounter: Secondary | ICD-10-CM

## 2013-12-10 DIAGNOSIS — F09 Unspecified mental disorder due to known physiological condition: Secondary | ICD-10-CM

## 2013-12-10 DIAGNOSIS — F4323 Adjustment disorder with mixed anxiety and depressed mood: Secondary | ICD-10-CM

## 2013-12-22 ENCOUNTER — Telehealth: Payer: Self-pay

## 2013-12-22 MED ORDER — METHYLPHENIDATE HCL ER (OSM) 36 MG PO TBCR: 36.0000 mg | EXTENDED_RELEASE_TABLET | ORAL | Status: DC

## 2013-12-22 NOTE — Telephone Encounter (Signed)
Patient wife is requesting a refill of patient's Concerta. She is also requesting a increase in the dose of Concerta. She stated that the patient's attention span  is not as good as it was with Ritalin.

## 2013-12-22 NOTE — Telephone Encounter (Signed)
Have increase to 36mg . rx written

## 2013-12-23 ENCOUNTER — Other Ambulatory Visit: Payer: Self-pay

## 2013-12-23 NOTE — Telephone Encounter (Signed)
Contacted patient's wife to inform her that patient's Concerta RX is ready for pickup.

## 2013-12-24 ENCOUNTER — Encounter: Payer: Self-pay | Admitting: Physical Medicine & Rehabilitation

## 2013-12-24 ENCOUNTER — Telehealth: Payer: Self-pay | Admitting: *Deleted

## 2013-12-24 DIAGNOSIS — F09 Unspecified mental disorder due to known physiological condition: Secondary | ICD-10-CM

## 2013-12-24 DIAGNOSIS — S069X5A Unspecified intracranial injury with loss of consciousness greater than 24 hours with return to pre-existing conscious level, initial encounter: Secondary | ICD-10-CM

## 2013-12-24 NOTE — Telephone Encounter (Signed)
Notified Albert [name] that the insurance form/ letter is ready.  She requests that we put it in the mail.  Done.

## 2013-12-26 ENCOUNTER — Ambulatory Visit: Payer: 59 | Admitting: Physical Medicine & Rehabilitation

## 2013-12-29 ENCOUNTER — Encounter: Payer: 59 | Attending: Physical Medicine & Rehabilitation | Admitting: Physical Medicine & Rehabilitation

## 2013-12-29 ENCOUNTER — Encounter: Payer: Self-pay | Admitting: Physical Medicine & Rehabilitation

## 2013-12-29 VITALS — BP 161/78 | HR 69 | Resp 14 | Ht 72.0 in | Wt 200.0 lb

## 2013-12-29 DIAGNOSIS — S065XAA Traumatic subdural hemorrhage with loss of consciousness status unknown, initial encounter: Secondary | ICD-10-CM

## 2013-12-29 DIAGNOSIS — E039 Hypothyroidism, unspecified: Secondary | ICD-10-CM | POA: Insufficient documentation

## 2013-12-29 DIAGNOSIS — S069X9S Unspecified intracranial injury with loss of consciousness of unspecified duration, sequela: Secondary | ICD-10-CM

## 2013-12-29 DIAGNOSIS — S066X9A Traumatic subarachnoid hemorrhage with loss of consciousness of unspecified duration, initial encounter: Secondary | ICD-10-CM | POA: Insufficient documentation

## 2013-12-29 DIAGNOSIS — S069XAS Unspecified intracranial injury with loss of consciousness status unknown, sequela: Secondary | ICD-10-CM | POA: Insufficient documentation

## 2013-12-29 DIAGNOSIS — E291 Testicular hypofunction: Secondary | ICD-10-CM | POA: Insufficient documentation

## 2013-12-29 DIAGNOSIS — F0630 Mood disorder due to known physiological condition, unspecified: Secondary | ICD-10-CM

## 2013-12-29 DIAGNOSIS — S065X9A Traumatic subdural hemorrhage with loss of consciousness of unspecified duration, initial encounter: Secondary | ICD-10-CM

## 2013-12-29 DIAGNOSIS — X58XXXA Exposure to other specified factors, initial encounter: Secondary | ICD-10-CM | POA: Insufficient documentation

## 2013-12-29 DIAGNOSIS — F39 Unspecified mood [affective] disorder: Secondary | ICD-10-CM

## 2013-12-29 DIAGNOSIS — S069X9A Unspecified intracranial injury with loss of consciousness of unspecified duration, initial encounter: Secondary | ICD-10-CM | POA: Insufficient documentation

## 2013-12-29 DIAGNOSIS — I6200 Nontraumatic subdural hemorrhage, unspecified: Secondary | ICD-10-CM

## 2013-12-29 DIAGNOSIS — S069XAA Unspecified intracranial injury with loss of consciousness status unknown, initial encounter: Secondary | ICD-10-CM | POA: Insufficient documentation

## 2013-12-29 MED ORDER — PROPRANOLOL HCL 20 MG PO TABS: 20.0000 mg | ORAL_TABLET | Freq: Two times a day (BID) | ORAL | Status: DC

## 2013-12-29 MED ORDER — METHYLPHENIDATE HCL ER (OSM) 36 MG PO TBCR: 36.0000 mg | EXTENDED_RELEASE_TABLET | ORAL | Status: DC

## 2013-12-29 NOTE — Patient Instructions (Signed)
CHECK BP AT HOME. WE CAN INCREASE HIS PROPRANOLOL IF NEEDED.   WORK ON A WEEKLY SCHEDULE THAT YOU USE ROUTINELY TO  HELP WITH YOUR MEMORY AND TO KEEP YOURSELF MORE ORGANIZED.

## 2013-12-29 NOTE — Progress Notes (Signed)
Subjective:    Patient ID: Albert [name], male    DOB: [birth year], 38 y.o.   MRN: [id number]  HPI  Albert [name] is back regarding his TBI and associated behavior/cognitive deficits. He just returned from [place] after visiting a friend.    We added propranolol which helped some of his mood lability. His headaches are MUCH improved  His testosterone level was low so i added testosterone patch to supplement.   Overall Albert [name] is initiating more and more active around the house. He does a lot of house work and chores. He still has short term memory deficits.   We switched him to concerta which seems to have helped his initiation.   Exelon has been tolerated thus far as well.      Pain Inventory Average Pain 3 Pain Right Now 1 My pain is intermittent and other  In the last 24 hours, has pain interfered with the following? General activity 4 Relation with others 4 Enjoyment of life 4 What TIME of day is your pain at its worst? morning, evening Sleep (in general) Good  Pain is worse with: na Pain improves with: rest Relief from Meds: no pain meds  Mobility walk without assistance ability to climb steps?  yes do you drive?  no transfers alone  Function not employed: date last employed 12/14/12 Do you have any goals in this area?  yes  Neuro/Psych weakness trouble walking  Prior Studies Any changes since last visit?  no  Physicians involved in your care Any changes since last visit?  no   Family History  Problem Relation Age of Onset  . Heart disease Father    History   Social History  . Marital Status: Married    Spouse Name: N/A    Number of Children: N/A  . Years of Education: N/A   Social History Main Topics  . Smoking status: Former Smoker    Types: Cigarettes    Quit date: 12/17/1999  . Smokeless tobacco: Never Used  . Alcohol Use: 0.0 oz/week  . Drug Use: No  . Sexual Activity: None   Other Topics Concern  . None   Social History  Narrative  . None   Past Surgical History  Procedure Laterality Date  . Craniotomy Left 12/15/2012    Procedure: CRANIECTOMY HEMATOMA EVACUATION SUBDURAL WITH PLACEMENT OF BONE FLAP IN ABDOMINAL WALL;  Surgeon: Floyce Stakes, MD;  Location: [place] [facility];  Service: Neurosurgery;  Laterality: Left;  . Craniotomy Left 12/28/2012    Procedure: CRANIOTOMY HEMATOMA EVACUATION SUBDURAL;  Surgeon: Erline Levine, MD;  Location: [place] [facility];  Service: Neurosurgery;  Laterality: Left;  . Peg placement N/A 12/31/2012    Procedure: PERCUTANEOUS ENDOSCOPIC GASTROSTOMY (PEG) PLACEMENT;  Surgeon: Zenovia Jarred, MD;  Location: [place];  Service: General;  Laterality: N/A;  bedside  peg  . Percutaneous tracheostomy N/A 12/31/2012    Procedure: PERCUTANEOUS TRACHEOSTOMY (BEDSIDE);  Surgeon: Zenovia Jarred, MD;  Location: [place];  Service: General;  Laterality: N/A;  . Wrist surgery Right 2008  . Cranioplasty N/A 06/10/2013    Procedure: CRANIOPLASTY;  Surgeon: Floyce Stakes, MD;  Location: [facility];  Service: Neurosurgery;  Laterality: N/A;  CRANIOPLASTY   Past Medical History  Diagnosis Date  . Headache(784.0)   . Traumatic brain injury   . 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 Rotator cuff injury     right  . Stroke     some aphasia , weakness in the  R arm , Out pt. rehab currently   . Swallowing problem     still relearning controlled swallowing - rec'ing speech therapy in rehab   BP 161/78  Pulse 69  Resp 14  Ht 6' (1.829 m)  Wt 200 lb (90.719 kg)  BMI 27.12 kg/m2  SpO2 98%  Opioid Risk Score:   Fall Risk Score: Moderate Fall Risk (6-13 points) (pt educated and given brochure on fall risk previously)   Review of Systems  Musculoskeletal: Positive for gait problem.  Neurological: Positive for weakness and headaches.  All other systems reviewed and are negative.      Objective:   Physical Exam  General: Alert and oriented x 3, No apparent distress. Wearing helmet.  HEENT: Head is  normocephalic, atraumatic, PERRLA, EOMI, sclera anicteric, oral mucosa pink and moist, dentition intact, ext ear canals clear,  Neck: Supple without JVD or lymphadenopathy  Heart: Reg rate and rhythm. No murmurs rubs or gallops  Chest: CTA bilaterally without wheezes, rales, or rhonchi; no distress  Abdomen: Soft, non-tender, non-distended, bowel sounds positive.  Extremities: No clubbing, cyanosis, or edema. Pulses are 2+  Skin: Clean and intact without signs of breakdown  Neuro: Pt recalled the month, year. Told me why he was here. Recalls events at home. Memory improving for ST facts. Initiates MUCH more. Affect MORE dynamic. Smiles, jokes, etc. Seems to have better insight and awareness. Non-agitated.  Right HH, no central deficits appreciated.  Doesn't swing the right arm as much with gait.  Appears fairly steady on his feet. DTR's 3+ on right.  Musculoskeletal: Right shoulder with minimal tenderness  Psych: Pt's affect is more dynamic and pleasant.  Pt is cooperative GU: scrotum not visualized    Assessment:  1. 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 Status post craniotomy evacuation of hematoma. He has made substantial progress but still has significant cognitive deficits which affect memory, attention, energy levels, focus, higher level cognition.  2. Old right shoulder injury, likely RTC tendonitis--improved  3. Seizure post- traumatic  4. Scrotal swelling, pain----?dermatitis  5. Recent headaches. ?post-traumatic, exacerbated by URI    Plan:  1. Cntinue low dose propranolol for headache and mood. Consider further titration if bp remains elevated. 2. Exelon 3mg  BID for memory.   3. Concerta for attention and memory. Stay with 37mg  daily. Advised taking before 9am each day.  4. Continue keppra 750mg  bid forat least  12 months post injury.    5. Testosterone supplementation for now. Recheck levels in summer.  6. Follow up with me in 3  months. 30 minutes of face to face patient care time was spent during this visit. He remains pre-vocational. Milderd Meager to take more responbility at home. Advised that he create a schedule and routine within which he can keep appts, important things he needs to do, etc.

## 2013-12-30 ENCOUNTER — Encounter: Payer: 59 | Admitting: Registered Nurse

## 2014-01-07 DIAGNOSIS — F09 Unspecified mental disorder due to known physiological condition: Secondary | ICD-10-CM

## 2014-01-07 DIAGNOSIS — F4323 Adjustment disorder with mixed anxiety and depressed mood: Secondary | ICD-10-CM

## 2014-01-19 ENCOUNTER — Encounter: Payer: Self-pay | Admitting: Physical Medicine & Rehabilitation

## 2014-01-29 DIAGNOSIS — F09 Unspecified mental disorder due to known physiological condition: Secondary | ICD-10-CM

## 2014-01-29 DIAGNOSIS — F4323 Adjustment disorder with mixed anxiety and depressed mood: Secondary | ICD-10-CM

## 2014-02-11 DIAGNOSIS — F4323 Adjustment disorder with mixed anxiety and depressed mood: Secondary | ICD-10-CM

## 2014-02-11 DIAGNOSIS — F09 Unspecified mental disorder due to known physiological condition: Secondary | ICD-10-CM

## 2014-02-23 ENCOUNTER — Telehealth: Payer: Self-pay

## 2014-02-23 MED ORDER — METHYLPHENIDATE HCL ER (OSM) 36 MG PO TBCR: 36.0000 mg | EXTENDED_RELEASE_TABLET | ORAL | Status: DC

## 2014-02-23 NOTE — Telephone Encounter (Signed)
Attempted to contact patient's wife to inform her that patient's Concerta RX is ready for pickup. Left a voicemail.

## 2014-02-23 NOTE — Telephone Encounter (Signed)
Patients wife called requesting a refill on concerta.  Rx printed to be signed.  Will contact patient when ready for pick up.

## 2014-02-26 DIAGNOSIS — F4323 Adjustment disorder with mixed anxiety and depressed mood: Secondary | ICD-10-CM

## 2014-02-26 DIAGNOSIS — F09 Unspecified mental disorder due to known physiological condition: Secondary | ICD-10-CM

## 2014-03-24 ENCOUNTER — Encounter: Payer: 59 | Attending: Physical Medicine & Rehabilitation | Admitting: Physical Medicine & Rehabilitation

## 2014-03-24 ENCOUNTER — Encounter: Payer: Self-pay | Admitting: Physical Medicine & Rehabilitation

## 2014-03-24 VITALS — BP 137/81 | HR 69 | Resp 14 | Ht 72.0 in | Wt 207.0 lb

## 2014-03-24 DIAGNOSIS — S069XAS Unspecified intracranial injury with loss of consciousness status unknown, sequela: Secondary | ICD-10-CM

## 2014-03-24 DIAGNOSIS — S069X9A Unspecified intracranial injury with loss of consciousness of unspecified duration, initial encounter: Secondary | ICD-10-CM | POA: Insufficient documentation

## 2014-03-24 DIAGNOSIS — S065XAA Traumatic subdural hemorrhage with loss of consciousness status unknown, initial encounter: Secondary | ICD-10-CM | POA: Diagnosis present

## 2014-03-24 DIAGNOSIS — S065X9A Traumatic subdural hemorrhage with loss of consciousness of unspecified duration, initial encounter: Secondary | ICD-10-CM | POA: Insufficient documentation

## 2014-03-24 DIAGNOSIS — S069X5S Unspecified intracranial injury with loss of consciousness greater than 24 hours with return to pre-existing conscious level, sequela: Secondary | ICD-10-CM

## 2014-03-24 DIAGNOSIS — E039 Hypothyroidism, unspecified: Secondary | ICD-10-CM | POA: Insufficient documentation

## 2014-03-24 DIAGNOSIS — F4323 Adjustment disorder with mixed anxiety and depressed mood: Secondary | ICD-10-CM

## 2014-03-24 DIAGNOSIS — E349 Endocrine disorder, unspecified: Secondary | ICD-10-CM

## 2014-03-24 DIAGNOSIS — S069XAA Unspecified intracranial injury with loss of consciousness status unknown, initial encounter: Secondary | ICD-10-CM | POA: Insufficient documentation

## 2014-03-24 DIAGNOSIS — X58XXXA Exposure to other specified factors, initial encounter: Secondary | ICD-10-CM | POA: Insufficient documentation

## 2014-03-24 DIAGNOSIS — S066X9A Traumatic subarachnoid hemorrhage with loss of consciousness of unspecified duration, initial encounter: Secondary | ICD-10-CM | POA: Diagnosis present

## 2014-03-24 DIAGNOSIS — E291 Testicular hypofunction: Secondary | ICD-10-CM | POA: Diagnosis present

## 2014-03-24 DIAGNOSIS — F09 Unspecified mental disorder due to known physiological condition: Secondary | ICD-10-CM

## 2014-03-24 DIAGNOSIS — S069X9S Unspecified intracranial injury with loss of consciousness of unspecified duration, sequela: Secondary | ICD-10-CM

## 2014-03-24 MED ORDER — METHYLPHENIDATE HCL ER (OSM) 36 MG PO TBCR: 36.0000 mg | EXTENDED_RELEASE_TABLET | ORAL | Status: DC

## 2014-03-24 MED ORDER — PROPRANOLOL HCL 20 MG PO TABS
20.0000 mg | ORAL_TABLET | Freq: Two times a day (BID) | ORAL | Status: DC
Start: 2014-03-24 — End: 2015-02-23

## 2014-03-24 NOTE — Progress Notes (Signed)
Subjective:    Patient ID: Albert [name], male    DOB: 1976-03-01, 38 y.o.   MRN: [id number]  HPI  Albert [name] is back regarding his TBI and associated deficits. Overall things have been going fairly well. 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 He visited [place] in June with his daughters and things seemed to go well overall. He also went down to the beach with his family.   He is not using his testosterone patch consistently. He has however shown overall improvement in energy and awareness.   Wife asked about driving today.         Pain Inventory Average Pain 0 Pain Right Now 0 My pain is no pain  In the last 24 hours, has pain interfered with the following? General activity 0 Relation with others 0 Enjoyment of life 0 What TIME of day is your pain at its worst? no pain Sleep (in general) Good  Pain is worse with: no pain Pain improves with: no pain Relief from Meds: no pain  Mobility walk without assistance  Function disabled: date disabled . I need assistance with the following:  meal prep, household duties and shopping  Neuro/Psych trouble walking  Prior Studies Any changes since last visit?  no  Physicians involved in your care Any changes since last visit?  no   Family History  Problem Relation Age of Onset  . Heart disease Father    History   Social History  . Marital Status: Married    Spouse Name: N/A    Number of Children: N/A  . Years of Education: N/A   Social History Main Topics  . Smoking status: Former Smoker    Types: Cigarettes    Quit date: 12/17/1999  . Smokeless tobacco: Never Used  . Alcohol Use: 0.0 oz/week  . Drug Use: No  . Sexual Activity: None   Other Topics Concern  . None   Social History Narrative  . None   Past Surgical History  Procedure Laterality Date  . Craniotomy Left 12/15/2012    Procedure: CRANIECTOMY HEMATOMA EVACUATION SUBDURAL WITH PLACEMENT OF BONE FLAP IN ABDOMINAL WALL;  Surgeon: Floyce Stakes,  MD;  Location: [facility];  Service: Neurosurgery;  Laterality: Left;  . Craniotomy Left 12/28/2012    Procedure: CRANIOTOMY HEMATOMA EVACUATION SUBDURAL;  Surgeon: Erline Levine, MD;  Location: [facility];  Service: Neurosurgery;  Laterality: Left;  . Peg placement N/A 12/31/2012    Procedure: PERCUTANEOUS ENDOSCOPIC GASTROSTOMY (PEG) PLACEMENT;  Surgeon: Zenovia Jarred, MD;  Location: [facility];  Service: General;  Laterality: N/A;  bedside  peg  . Percutaneous tracheostomy N/A 12/31/2012    Procedure: PERCUTANEOUS TRACHEOSTOMY (BEDSIDE);  Surgeon: Zenovia Jarred, MD;  Location: [facility];  Service: General;  Laterality: N/A;  . Wrist surgery Right 2008  . Cranioplasty N/A 06/10/2013    Procedure: CRANIOPLASTY;  Surgeon: Floyce Stakes, MD;  Location: [facility];  Service: Neurosurgery;  Laterality: N/A;  CRANIOPLASTY   Past Medical History  Diagnosis Date  . Headache(784.0)   . Traumatic brain injury   . 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 Rotator cuff injury     right  . Stroke     some aphasia , weakness in the R arm , Out pt. rehab currently   . Swallowing problem     still relearning controlled swallowing - rec'ing speech therapy in rehab   BP 137/81  Pulse 69  Resp 14  Ht 6' (  1.829 m)  Wt 207 lb (93.895 kg)  BMI 28.07 kg/m2  SpO2 97%  Opioid Risk Score:   Fall Risk Score:  (previously educated and given a handout for fall prevention inthe home)  Review of Systems  Musculoskeletal: Positive for gait problem.  All other systems reviewed and are negative.      Objective:   Physical Exam   General: Alert and oriented x 3, No apparent distress. Wearing helmet.  HEENT: Head is normocephalic, atraumatic, PERRLA, EOMI, sclera anicteric, oral mucosa pink and moist, dentition intact, ext ear canals clear,  Neck: Supple without JVD or lymphadenopathy  Heart: Reg rate and rhythm. No murmurs rubs or gallops  Chest: CTA bilaterally without wheezes, rales, or rhonchi; no  distress  Abdomen: Soft, non-tender, non-distended, bowel sounds positive.  Extremities: No clubbing, cyanosis, or edema. Pulses are 2+  Skin: Clean and intact without signs of breakdown  Neuro: Pt recalled the month, year. Told me why he was here. Recalls events at home. Memory improving for ST facts. Initiates  more. Affect  Dynamic. better insight and awareness. Non-agitated. Right HH, no central deficits appreciated. Doesn't swing the right arm as much with gait. Appears fairly steady on his feet. DTR's 3+ on right.  Musculoskeletal: Right shoulder with minimal tenderness  Psych: Pt's affect is more dynamic and pleasant. Pt is cooperative GU: scrotum not visualized    Assessment:  1. 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 Status post craniotomy evacuation of hematoma. He has made substantial progress but still has significant cognitive deficits which affect memory, attention, energy levels, focus, higher level cognition.  2. Old right shoulder injury, likely RTC tendonitis--improved  3. Seizure post- traumatic  4. Scrotal swelling, pain----?dermatitis  5. Recent headaches. ?post-traumatic, exacerbated by URI    Plan:  1. Cntinue low dose propranolol for headache and mood. Consider further titration if bp remains elevated.  2. Exelon 3mg  BID for memory.  3. Concerta for attention and memory. Second rx for next month..  4. Continue keppra 750mg  bid for now.  5. Testosterone--stop . He's not taking it consistently anyway and energy levels are good.  6. Discussed driving. I asked that his wife try some driving in an empty parking lot and then in a low traffic area with him early in the morning. These should be trials only. He is NOT allowed to drive independently. I'm unsure that he will ever be abl to drive alone given his visual and cognitive deficits.  7. Follow up with me in 4 months. 30 minutes of face to face patient care time was spent during  this visit. He remains pre-vocational. Discussed the possibility of neuropsych testing after I see him back in November

## 2014-03-24 NOTE — Patient Instructions (Signed)
PLEASE CALL ME WITH ANY PROBLEMS OR QUESTIONS (#[phone]).      

## 2014-04-08 DIAGNOSIS — F4323 Adjustment disorder with mixed anxiety and depressed mood: Secondary | ICD-10-CM

## 2014-04-08 DIAGNOSIS — F09 Unspecified mental disorder due to known physiological condition: Secondary | ICD-10-CM

## 2014-04-08 DIAGNOSIS — S069XAS Unspecified intracranial injury with loss of consciousness status unknown, sequela: Secondary | ICD-10-CM

## 2014-04-08 DIAGNOSIS — X58XXXA Exposure to other specified factors, initial encounter: Secondary | ICD-10-CM

## 2014-04-08 DIAGNOSIS — S069X9S Unspecified intracranial injury with loss of consciousness of unspecified duration, sequela: Secondary | ICD-10-CM

## 2014-04-22 DIAGNOSIS — X58XXXA Exposure to other specified factors, initial encounter: Secondary | ICD-10-CM

## 2014-04-22 DIAGNOSIS — S069XAS Unspecified intracranial injury with loss of consciousness status unknown, sequela: Secondary | ICD-10-CM

## 2014-04-22 DIAGNOSIS — F4323 Adjustment disorder with mixed anxiety and depressed mood: Secondary | ICD-10-CM

## 2014-04-22 DIAGNOSIS — F09 Unspecified mental disorder due to known physiological condition: Secondary | ICD-10-CM

## 2014-04-22 DIAGNOSIS — S069X9S Unspecified intracranial injury with loss of consciousness of unspecified duration, sequela: Secondary | ICD-10-CM

## 2014-05-06 DIAGNOSIS — S069XAS Unspecified intracranial injury with loss of consciousness status unknown, sequela: Secondary | ICD-10-CM

## 2014-05-06 DIAGNOSIS — F4323 Adjustment disorder with mixed anxiety and depressed mood: Secondary | ICD-10-CM

## 2014-05-06 DIAGNOSIS — S069X9S Unspecified intracranial injury with loss of consciousness of unspecified duration, sequela: Secondary | ICD-10-CM

## 2014-05-06 DIAGNOSIS — F09 Unspecified mental disorder due to known physiological condition: Secondary | ICD-10-CM

## 2014-05-06 DIAGNOSIS — X58XXXA Exposure to other specified factors, initial encounter: Secondary | ICD-10-CM

## 2014-05-13 ENCOUNTER — Other Ambulatory Visit: Payer: Self-pay | Admitting: Physical Medicine & Rehabilitation

## 2014-05-14 ENCOUNTER — Telehealth: Payer: Self-pay

## 2014-05-14 MED ORDER — METHYLPHENIDATE HCL ER (OSM) 36 MG PO TBCR: 36.0000 mg | EXTENDED_RELEASE_TABLET | ORAL | Status: DC

## 2014-05-14 NOTE — Telephone Encounter (Signed)
Patient's wife called on his behalf requesting a refill on Concerta. RX printed to be signed. Will contact patient when ready for pickup.

## 2014-05-14 NOTE — Telephone Encounter (Signed)
Attempted to contact patient's wife to inform her the RX will be ready for pickup tomorrow afternoon. Left a voicemail.

## 2014-05-15 DIAGNOSIS — X58XXXA Exposure to other specified factors, initial encounter: Secondary | ICD-10-CM

## 2014-05-15 DIAGNOSIS — F4323 Adjustment disorder with mixed anxiety and depressed mood: Secondary | ICD-10-CM

## 2014-05-15 DIAGNOSIS — F09 Unspecified mental disorder due to known physiological condition: Secondary | ICD-10-CM

## 2014-05-15 DIAGNOSIS — S069XAS Unspecified intracranial injury with loss of consciousness status unknown, sequela: Secondary | ICD-10-CM

## 2014-05-15 DIAGNOSIS — S069X9S Unspecified intracranial injury with loss of consciousness of unspecified duration, sequela: Secondary | ICD-10-CM

## 2014-05-29 DIAGNOSIS — X58XXXA Exposure to other specified factors, initial encounter: Secondary | ICD-10-CM

## 2014-05-29 DIAGNOSIS — S069XAS Unspecified intracranial injury with loss of consciousness status unknown, sequela: Secondary | ICD-10-CM

## 2014-05-29 DIAGNOSIS — S069X9S Unspecified intracranial injury with loss of consciousness of unspecified duration, sequela: Secondary | ICD-10-CM

## 2014-05-29 DIAGNOSIS — F09 Unspecified mental disorder due to known physiological condition: Secondary | ICD-10-CM

## 2014-05-29 DIAGNOSIS — F4323 Adjustment disorder with mixed anxiety and depressed mood: Secondary | ICD-10-CM

## 2014-06-14 ENCOUNTER — Other Ambulatory Visit: Payer: Self-pay | Admitting: Physical Medicine & Rehabilitation

## 2014-06-15 ENCOUNTER — Telehealth: Payer: Self-pay | Admitting: *Deleted

## 2014-06-15 NOTE — Telephone Encounter (Signed)
Patient recently in [place] and has a rash. Not sure what Dr. Naaman Plummer would do for him.

## 2014-06-16 ENCOUNTER — Other Ambulatory Visit: Payer: Self-pay | Admitting: Physical Medicine & Rehabilitation

## 2014-06-16 DIAGNOSIS — X58XXXA Exposure to other specified factors, initial encounter: Secondary | ICD-10-CM

## 2014-06-16 DIAGNOSIS — F068 Other specified mental disorders due to known physiological condition: Secondary | ICD-10-CM

## 2014-06-16 DIAGNOSIS — S069X9S Unspecified intracranial injury with loss of consciousness of unspecified duration, sequela: Secondary | ICD-10-CM

## 2014-06-16 DIAGNOSIS — F4323 Adjustment disorder with mixed anxiety and depressed mood: Secondary | ICD-10-CM

## 2014-06-24 ENCOUNTER — Telehealth: Payer: Self-pay | Admitting: *Deleted

## 2014-06-24 NOTE — Telephone Encounter (Signed)
Patient would like a refill on his Concerta... I don't see it on his med list, but it listed in the last note.  Please advise.  Also requesting handicap placard  [place]

## 2014-06-25 NOTE — Telephone Encounter (Signed)
Methylphenidate is concerta.  Please have eunice refill---thanks  Does he really need a handicap placard?  What for?

## 2014-06-26 ENCOUNTER — Other Ambulatory Visit: Payer: Self-pay | Admitting: *Deleted

## 2014-06-26 DIAGNOSIS — F909 Attention-deficit hyperactivity disorder, unspecified type: Secondary | ICD-10-CM

## 2014-06-26 MED ORDER — METHYLPHENIDATE HCL ER (OSM) 36 MG PO TBCR: 36.0000 mg | EXTENDED_RELEASE_TABLET | ORAL | Status: DC

## 2014-06-26 NOTE — Telephone Encounter (Signed)
Called pt and told them their RX is ready.  Albert [name] asked about another Handicap Placard.

## 2014-06-26 NOTE — Telephone Encounter (Signed)
[name] called to stating pt's meds ran out yesterday. He will need to come and script today. Please call pt or his wife.. Thanks so much.Marland KitchenMarland Kitchen

## 2014-06-26 NOTE — Telephone Encounter (Signed)
Ok to fill as per [employer].  Done

## 2014-06-29 DIAGNOSIS — F4323 Adjustment disorder with mixed anxiety and depressed mood: Secondary | ICD-10-CM

## 2014-06-29 DIAGNOSIS — S069X9S Unspecified intracranial injury with loss of consciousness of unspecified duration, sequela: Secondary | ICD-10-CM

## 2014-06-29 DIAGNOSIS — F068 Other specified mental disorders due to known physiological condition: Secondary | ICD-10-CM

## 2014-07-11 ENCOUNTER — Other Ambulatory Visit: Payer: Self-pay | Admitting: Physical Medicine & Rehabilitation

## 2014-07-13 DIAGNOSIS — F068 Other specified mental disorders due to known physiological condition: Secondary | ICD-10-CM

## 2014-07-13 DIAGNOSIS — S069X9S Unspecified intracranial injury with loss of consciousness of unspecified duration, sequela: Secondary | ICD-10-CM

## 2014-07-13 DIAGNOSIS — F4323 Adjustment disorder with mixed anxiety and depressed mood: Secondary | ICD-10-CM

## 2014-07-18 ENCOUNTER — Other Ambulatory Visit: Payer: Self-pay | Admitting: Physical Medicine & Rehabilitation

## 2014-07-24 ENCOUNTER — Encounter: Payer: 59 | Attending: Physical Medicine & Rehabilitation | Admitting: Physical Medicine & Rehabilitation

## 2014-07-24 ENCOUNTER — Ambulatory Visit: Payer: 59 | Admitting: Physical Medicine & Rehabilitation

## 2014-07-24 ENCOUNTER — Encounter: Payer: Self-pay | Admitting: Physical Medicine & Rehabilitation

## 2014-07-24 VITALS — BP 136/76 | HR 70 | Resp 14 | Ht 72.0 in | Wt 205.6 lb

## 2014-07-24 DIAGNOSIS — R561 Post traumatic seizures: Secondary | ICD-10-CM | POA: Insufficient documentation

## 2014-07-24 DIAGNOSIS — R51 Headache: Secondary | ICD-10-CM | POA: Diagnosis not present

## 2014-07-24 DIAGNOSIS — N508 Other specified disorders of male genital organs: Secondary | ICD-10-CM | POA: Diagnosis not present

## 2014-07-24 DIAGNOSIS — F39 Unspecified mood [affective] disorder: Secondary | ICD-10-CM

## 2014-07-24 DIAGNOSIS — R4189 Other symptoms and signs involving cognitive functions and awareness: Secondary | ICD-10-CM | POA: Insufficient documentation

## 2014-07-24 DIAGNOSIS — F068 Other specified mental disorders due to known physiological condition: Secondary | ICD-10-CM | POA: Insufficient documentation

## 2014-07-24 DIAGNOSIS — S069XAS Unspecified intracranial injury with loss of consciousness status unknown, sequela: Secondary | ICD-10-CM

## 2014-07-24 DIAGNOSIS — S069X0S Unspecified intracranial injury without loss of consciousness, sequela: Secondary | ICD-10-CM

## 2014-07-24 DIAGNOSIS — Z87820 Personal history of traumatic brain injury: Secondary | ICD-10-CM | POA: Diagnosis present

## 2014-07-24 DIAGNOSIS — F900 Attention-deficit hyperactivity disorder, predominantly inattentive type: Secondary | ICD-10-CM

## 2014-07-24 DIAGNOSIS — S069X4S Unspecified intracranial injury with loss of consciousness of 6 hours to 24 hours, sequela: Secondary | ICD-10-CM

## 2014-07-24 DIAGNOSIS — S069X9S Unspecified intracranial injury with loss of consciousness of unspecified duration, sequela: Secondary | ICD-10-CM

## 2014-07-24 DIAGNOSIS — F909 Attention-deficit hyperactivity disorder, unspecified type: Secondary | ICD-10-CM

## 2014-07-24 DIAGNOSIS — F0630 Mood disorder due to known physiological condition, unspecified: Secondary | ICD-10-CM

## 2014-07-24 MED ORDER — RIVASTIGMINE TARTRATE 6 MG PO CAPS: ORAL_CAPSULE | ORAL | Status: DC

## 2014-07-24 MED ORDER — LEVETIRACETAM 500 MG PO TABS: 500.0000 mg | ORAL_TABLET | Freq: Two times a day (BID) | ORAL | Status: DC

## 2014-07-24 MED ORDER — METHYLPHENIDATE HCL ER (OSM) 36 MG PO TBCR: 36.0000 mg | EXTENDED_RELEASE_TABLET | ORAL | Status: DC

## 2014-07-24 NOTE — Patient Instructions (Signed)
PLEASE CALL ME WITH ANY PROBLEMS OR QUESTIONS (#[phone]).      

## 2014-07-24 NOTE — Progress Notes (Signed)
Subjective:    Patient ID: Albert [name], male    DOB: [birth year], 38 y.o.   MRN: [id number]  HPI   Albert [name] is back regarding his TBI. Things have been going pretty well at home. He has been taking care of the house. Emotionally, he's doing fairly well also.  [name] is interested in getting back to work but realizes that his memory and organizational skills might not be to that point yet.   He is interested in driving. His wife has done a couple trials in the neighborhood.   Pain Inventory Average Pain 0 Pain Right Now 0 My pain is NO PAIN  In the last 24 hours, has pain interfered with the following? General activity 0 Relation with others 0 Enjoyment of life 0 What TIME of day is your pain at its worst? NO PAIN Sleep (in general) Good  Pain is worse with: NO PAIN Pain improves with: . Relief from Meds: 0  Mobility walk without assistance how many minutes can you walk? 60 ability to climb steps?  yes do you drive?  no  Function not employed: date last employed 12/13/2012  Neuro/Psych No problems in this area  Prior Studies Any changes since last visit?  no  Physicians involved in your care Any changes since last visit?  no   Family History  Problem Relation Age of Onset  . Heart disease Father    History   Social History  . Marital Status: Married    Spouse Name: N/A    Number of Children: N/A  . Years of Education: N/A   Social History Main Topics  . Smoking status: Former Smoker    Types: Cigarettes    Quit date: 12/17/1999  . Smokeless tobacco: Never Used  . Alcohol Use: 0.0 oz/week  . Drug Use: No  . Sexual Activity: None   Other Topics Concern  . None   Social History Narrative   Past Surgical History  Procedure Laterality Date  . Craniotomy Left 12/15/2012    Procedure: CRANIECTOMY HEMATOMA EVACUATION SUBDURAL WITH PLACEMENT OF BONE FLAP IN ABDOMINAL WALL;  Surgeon: Floyce Stakes, MD;  Location: [facility];  Service:  Neurosurgery;  Laterality: Left;  . Craniotomy Left 12/28/2012    Procedure: CRANIOTOMY HEMATOMA EVACUATION SUBDURAL;  Surgeon: Erline Levine, MD;  Location: [facility];  Service: Neurosurgery;  Laterality: Left;  . Peg placement N/A 12/31/2012    Procedure: PERCUTANEOUS ENDOSCOPIC GASTROSTOMY (PEG) PLACEMENT;  Surgeon: Zenovia Jarred, MD;  Location: [facility];  Service: General;  Laterality: N/A;  bedside  peg  . Percutaneous tracheostomy N/A 12/31/2012    Procedure: PERCUTANEOUS TRACHEOSTOMY (BEDSIDE);  Surgeon: Zenovia Jarred, MD;  Location: [facility];  Service: General;  Laterality: N/A;  . Wrist surgery Right 2008  . Cranioplasty N/A 06/10/2013    Procedure: CRANIOPLASTY;  Surgeon: Floyce Stakes, MD;  Location: [facility];  Service: Neurosurgery;  Laterality: N/A;  CRANIOPLASTY   Past Medical History  Diagnosis Date  . Headache(784.0)   . Traumatic brain injury   . 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 Rotator cuff injury     right  . Stroke     some aphasia , weakness in the R arm , Out pt. rehab currently   . Swallowing problem     still relearning controlled swallowing - rec'ing speech therapy in rehab   BP 136/76 mmHg  Pulse 70  Resp 14  Ht 6' (1.829 m)  [employer]  205 lb 9.6 oz (93.26 kg)  BMI 27.88 kg/m2  SpO2 97%  Opioid Risk Score:   Fall Risk Score: Low Fall Risk (0-5 points)   Review of Systems  Constitutional: Negative.   HENT: Negative.   Eyes: Negative.   Respiratory: Negative.   Cardiovascular: Negative.   Gastrointestinal: Negative.   Endocrine: Negative.   Genitourinary: Negative.   Musculoskeletal: Negative.   Skin: Negative.   Allergic/Immunologic: Negative.   Neurological: Negative.   Hematological: Negative.   Psychiatric/Behavioral: Negative.        Objective:   Physical Exam   General: Alert and oriented x 3, No apparent distress. Wearing helmet.  HEENT: Head is normocephalic, atraumatic, PERRLA, EOMI, sclera anicteric, oral mucosa pink and  moist, dentition intact, ext ear canals clear,  Neck: Supple without JVD or lymphadenopathy  Heart: Reg rate and rhythm. No murmurs rubs or gallops  Chest: CTA bilaterally without wheezes, rales, or rhonchi; no distress  Abdomen: Soft, non-tender, non-distended, bowel sounds positive.  Extremities: No clubbing, cyanosis, or edema. Pulses are 2+  Skin: Clean and intact without signs of breakdown  Neuro: Pt recalled the month, year. Initiates conversations. Asked pertinent questions. Attention better. Affect Dynamic. better insight and awareness. Non-agitated. Right HH, no central deficits appreciated. Doesn't swing the right arm as much with gait. Appears fairly steady on his feet. DTR's 3+ on right.  Musculoskeletal: Right shoulder with minimal tenderness  Psych: Pt's affect is more dynamic and pleasant. Pt is cooperative GU: scrotum not visualized    Assessment:  1. 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 Status post craniotomy evacuation of hematoma. He has made substantial progress but still has significant cognitive deficits which affect memory, attention, energy levels, focus, higher level cognition.  2. Old right shoulder injury, likely RTC tendonitis--improved  3. Seizure post- traumatic  4. Scrotal swelling, pain----?dermatitis  5. Recent headaches. ?post-traumatic, exacerbated by URI    Plan:  1. Continue low dose propranolol for headache and mood. 2. Increase Exelon to 6mg  BID for memory.  3. Concerta for attention and memory. Second rx for next month..  4. Continue keppra 750mg  bid for now.  5. Will make a referral back to SLP givern his cognitive improvements to see if we can push him further. Would potentially consider formal neuropsych testing afterward.  6. Gave him information regarding driving services. He would need to complete a formal driving eval before I allow him to return to the wheel. 7. Follow up with me in 2 months. 30  minutes of face to face patient care time was spent during this visit. He remains pre-vocational. Discussed the possibility of neuropsych testing after I see him back in November

## 2014-07-28 DIAGNOSIS — F4323 Adjustment disorder with mixed anxiety and depressed mood: Secondary | ICD-10-CM

## 2014-07-28 DIAGNOSIS — F068 Other specified mental disorders due to known physiological condition: Secondary | ICD-10-CM

## 2014-07-28 DIAGNOSIS — S069X9S Unspecified intracranial injury with loss of consciousness of unspecified duration, sequela: Secondary | ICD-10-CM

## 2014-08-12 DIAGNOSIS — F068 Other specified mental disorders due to known physiological condition: Secondary | ICD-10-CM

## 2014-08-12 DIAGNOSIS — F4323 Adjustment disorder with mixed anxiety and depressed mood: Secondary | ICD-10-CM

## 2014-08-12 DIAGNOSIS — S069X9S Unspecified intracranial injury with loss of consciousness of unspecified duration, sequela: Secondary | ICD-10-CM

## 2014-08-13 ENCOUNTER — Ambulatory Visit: Payer: 59 | Attending: Physical Medicine & Rehabilitation | Admitting: Speech Pathology

## 2014-08-13 DIAGNOSIS — R41841 Cognitive communication deficit: Secondary | ICD-10-CM | POA: Diagnosis present

## 2014-08-13 NOTE — Therapy (Signed)
[facility] [address] [place], [place], [address] Phone: [phone]   Fax:  [phone]  Speech Language Pathology Evaluation  Patient Details  Name: Albert [name] MRN: [id number] Date of Birth: July 18, 1976  Encounter Date: 08/13/2014      End of Session - 08/13/14 1135    Visit Number 1   Number of Visits 16   Date for SLP Re-Evaluation 10/08/14   SLP Start Time 0932   SLP Stop Time  1017   SLP Time Calculation (min) 45 min   Activity Tolerance Patient tolerated treatment well      Past Medical History  Diagnosis Date  . Headache(784.0)   . Traumatic brain injury   . 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 Rotator cuff injury     right  . Stroke     some aphasia , weakness in the R arm , Out pt. rehab currently   . Swallowing problem     still relearning controlled swallowing - rec'ing speech therapy in rehab    Past Surgical History  Procedure Laterality Date  . Craniotomy Left 12/15/2012    Procedure: CRANIECTOMY HEMATOMA EVACUATION SUBDURAL WITH PLACEMENT OF BONE FLAP IN ABDOMINAL WALL;  Surgeon: Floyce Stakes, MD;  Location: [facility];  Service: Neurosurgery;  Laterality: Left;  . Craniotomy Left 12/28/2012    Procedure: CRANIOTOMY HEMATOMA EVACUATION SUBDURAL;  Surgeon: Erline Levine, MD;  Location: [facility];  Service: Neurosurgery;  Laterality: Left;  . Peg placement N/A 12/31/2012    Procedure: PERCUTANEOUS ENDOSCOPIC GASTROSTOMY (PEG) PLACEMENT;  Surgeon: Zenovia Jarred, MD;  Location: [facility];  Service: General;  Laterality: N/A;  bedside  peg  . Percutaneous tracheostomy N/A 12/31/2012    Procedure: PERCUTANEOUS TRACHEOSTOMY (BEDSIDE);  Surgeon: Zenovia Jarred, MD;  Location: [facility];  Service: General;  Laterality: N/A;  . Wrist surgery Right 2008  . Cranioplasty N/A 06/10/2013    Procedure: CRANIOPLASTY;  Surgeon: Floyce Stakes, MD;  Location: [facility];  Service: Neurosurgery;  Laterality: N/A;   CRANIOPLASTY    There were no vitals taken for this visit.  Visit Diagnosis: Cognitive communication deficit - Plan: SLP plan of care cert/re-cert      Subjective Assessment - 08/13/14 1006    Symptoms "His memory and organization are the most affected."   Currently in Pain? No/denies          SLP Evaluation [facility] - 08/13/14 1006    SLP Visit Information   SLP Received On 08/13/14   Onset Date April 2014   Medical Diagnosis TBI   Subjective   Patient/Family Stated Goal To get better and back to normal   General Information   HPI 38  y.o male know to [place]    Prior Functional Status   Cognitive/Linguistic Baseline Within functional limits   Type of Home House    Lives With Spouse;Son;Daughter   Available Support Family   Education College   Vocation On disability   Cognition   Overall Cognitive Status Impaired/Different from baseline   Area of Impairment Orientation;Attention;Memory   Current Attention Level Divided   Memory Decreased short-term memory   Attention Divided   Divided Attention Impaired   Divided Attention Impairment Verbal basic;Functional basic   Memory Impaired   Memory Impairment Decreased recall of new information;Decreased short term memory;Storage deficit   Decreased Short Term Memory Verbal basic;Functional basic   Awareness Appears intact   Problem Solving Appears intact   Executive  Function Organizing;Decision Making;Reasoning   Reasoning Impaired   Organizing Impaired   Decision Making Impaired   Standardized Assessments   Standardized Assessments  Albert [name])          ADULT SLP TREATMENT - 08/13/14 0001    Treatment Provided   Treatment provided Cognitive-Linquistic   Cognitive-Linquistic Treatment   Treatment focused on Cognition;Patient/family/caregiver education   Skilled Treatment Pt trained in compensation for tasks that he is perseverating on and disrupting current family event. Albert [name] was instructed  to write down the task he wants to do then designate a time or date in the future when he will be able to complete this task. He required occassoinal moderate cues to verbalize this strategy.Marland Kitchen Spouse to help him complete this at home.   Assessment / Recommendations / Plan   Plan Continue with current plan of care          SLP Education - 08/13/14 1135    Education provided Yes   Education Details Goals, POC, compensation for perseveration/inflexibility   Person(s) Educated Patient;Spouse   Methods Explanation   Comprehension Verbalized understanding;Other (comment)  Pt wrote info in his notebook          SLP Short Term Goals - 08/13/14 1141    SLP SHORT TERM GOAL #1   Title Pt will utlize external memory aids for daily chores, phone calls, and events, documenting date and time for these with occassional minimal asssitance   Time 4   Period Weeks   Status New   SLP SHORT TERM GOAL #2   Title Organize/plan mildly complex tasks/information with 80% accuracy and occassional minimal assistance   Time 4   Period Weeks   Status New   SLP SHORT TERM GOAL #3   Title Alternate attention between 2 mildly complex cognitive linguistic tasks with mild distractions 80% accuracy on each.   Time 4   Period Weeks   Status New          SLP Long Term Goals - 08/13/14 1144    SLP LONG TERM GOAL #1   Title Use external memory aids for daily chores, events, phone messages over 4 sessions with modified independence   Time 8   Period Weeks   Status New   SLP LONG TERM GOAL #2   Title Organize/plan moderately complex tasks/information with 90% accuracy and rare minimal assistance   Time 8   Period Weeks   Status New   SLP LONG TERM GOAL #3   Title Divide attention between 2 simple cognitive linguistic tasks with 80% on each and occassional minimal assistance   Time 8   Period Weeks   Status New          Plan - 08/13/14 1136    Clinical Impression Statement Pt presents with mild  to moderate cogntive impairments s/p TBI 12/2012. He scored a 23/30 on the Albert [name] with impaired areas of executive function, memory (he recalled 1/5 words with a delay). Organization of tasks, decision making and reasoning also impaired. Pt has shown improvement since his prior ST evaluation. Pt's spouse reports that [employer] on tasks such as taking out recycling and going through the mail, inisiting on stopping what the family is doing to complete these tasks. I recommend skilled ST for compensations for memory, thought and information organization and attention to maxmize functional independence and reduce caregiver burden.   Speech Therapy Frequency 2x / week   Duration --  8 weeks  Treatment/Interventions Functional tasks;Patient/family education;Cueing hierarchy;Environmental controls;Cognitive reorganization;SLP instruction and feedback;Internal/external aids;Compensatory techniques   Potential to Achieve Goals Good   Potential Considerations Ability to learn/carryover information   Consulted and Agree with Plan of Care Patient;Family member/caregiver   Family Member Consulted wife        Problem List Patient Active Problem List   Diagnosis Date Noted  . Cognitive deficit as late effect of traumatic brain injury 07/24/2014  . 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 Hypothyroid 11/11/2013  . Testosterone deficiency 11/11/2013  . Subdural hematoma 10/22/2013  . H/O craniotomy 04/30/2013  . Injury of right rotator cuff 02/28/2013  . Scrotal irritation 02/28/2013  . Closed fracture of facial bones 01/03/2013  . TBI (traumatic brain injury) 01/03/2013  . Pneumonia 01/03/2013  . Acute blood loss anemia 12/30/2012  . Subdural hemorrhage following injury 12/15/2012  . Respiratory failure following trauma and surgery 12/15/2012  . Cardiac arrest 12/15/2012  . Blunt trauma of face 12/15/2012  . Blunt head trauma 12/15/2012   . Traumatic subarachnoid bleed with LOC of 30 minutes or less 12/15/2012  . Elevated ETOH level 12/15/2012                          Jorgeluis Gurganus, Annye Rusk 08/13/2014, 11:53 AM  Georgiann Hahn, [place], CCC-SLP 08/13/2014 11:53 AM Phone: [phone] Fax: [phone]

## 2014-08-13 NOTE — Patient Instructions (Signed)
Pt to use notebook to write down tasks that he is perseverating on and interfering with present event. He is to write down perseverative task and day or time he is going to address this task to reduce disruption present event.  He is also going to re-start brain games on his tablet/iphone

## 2014-08-20 ENCOUNTER — Ambulatory Visit: Payer: 59 | Admitting: Speech Pathology

## 2014-08-20 DIAGNOSIS — R41841 Cognitive communication deficit: Secondary | ICD-10-CM

## 2014-08-20 NOTE — Therapy (Signed)
[facility] [address] [place], [place], [address] Phone: [phone]   Fax:  [phone]  Speech Language Pathology Treatment  Patient Details  Name: Albert [name] MRN: [id number] Date of Birth: 10/26/1975  Encounter Date: 08/20/2014      End of Session - 08/20/14 1455    Visit Number 2   Number of Visits 16   Date for SLP Re-Evaluation 10/08/14   SLP Start Time 1015   SLP Stop Time  1103   SLP Time Calculation (min) 48 min      Past Medical History  Diagnosis Date  . Headache(784.0)   . Traumatic brain injury   . 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 Rotator cuff injury     right  . Stroke     some aphasia , weakness in the R arm , Out pt. rehab currently   . Swallowing problem     still relearning controlled swallowing - rec'ing speech therapy in rehab    Past Surgical History  Procedure Laterality Date  . Craniotomy Left 12/15/2012    Procedure: CRANIECTOMY HEMATOMA EVACUATION SUBDURAL WITH PLACEMENT OF BONE FLAP IN ABDOMINAL WALL;  Surgeon: Floyce Stakes, MD;  Location: [place] [facility];  Service: Neurosurgery;  Laterality: Left;  . Craniotomy Left 12/28/2012    Procedure: CRANIOTOMY HEMATOMA EVACUATION SUBDURAL;  Surgeon: Erline Levine, MD;  Location: [place] [facility];  Service: Neurosurgery;  Laterality: Left;  . Peg placement N/A 12/31/2012    Procedure: PERCUTANEOUS ENDOSCOPIC GASTROSTOMY (PEG) PLACEMENT;  Surgeon: Zenovia Jarred, MD;  Location: [place];  Service: General;  Laterality: N/A;  bedside  peg  . Percutaneous tracheostomy N/A 12/31/2012    Procedure: PERCUTANEOUS TRACHEOSTOMY (BEDSIDE);  Surgeon: Zenovia Jarred, MD;  Location: [place];  Service: General;  Laterality: N/A;  . Wrist surgery Right 2008  . Cranioplasty N/A 06/10/2013    Procedure: CRANIOPLASTY;  Surgeon: Floyce Stakes, MD;  Location: [facility];  Service: Neurosurgery;  Laterality: N/A;  CRANIOPLASTY    There were no vitals taken for this  visit.  Visit Diagnosis: Cognitive communication deficit      Subjective Assessment - 08/20/14 1022    Symptoms "We've been busy, so we haven't had to write down any things he was stuck on"   Currently in Pain? Yes   Pain Score 6    Pain Location Head   Pain Descriptors / Indicators Aching   Pain Type Acute pain   Pain Onset Today   Pain Relieving Factors took tylenol            ADULT SLP TREATMENT - 08/20/14 0001    General Information   Behavior/Cognition Alert;Pleasant mood   HPI TBI 2014   Treatment Provided   Treatment provided Cognitive-Linquistic   Cognitive-Linquistic Treatment   Treatment focused on Cognition   Skilled Treatment Organize mildly complex information in chart with extended time and minimal distractions  (door open, minimal distractions) . Occassional minimal assitance to organize chart, data entered with rare minimal assistance. Pt solved detail math problems regarding chart. with moderate cues to check his work..Pt gave visual of  his pantry  with how he would organize pantry. Homework to organize pantry, cleanout pantry checking for expired foods.    Assessment / Recommendations / Plan   Plan Continue with current plan of care   Progression Toward Goals   Progression toward goals Progressing toward goals          SLP Education -  08/20/14 1454    Education provided Yes   Education Details strategies for organization tasks at home, limit time, schedule time in memory book to come back to a task   Person(s) Educated Patient;Spouse   Methods Explanation;Handout   Comprehension Verbalized understanding          SLP Short Term Goals - 08/20/14 1457    SLP SHORT TERM GOAL #1   Title Pt will utlize external memory aids for daily chores, phone calls, and events, documenting date and time for these with occassional minimal asssitance   Time 3   Period Weeks   Status On-going   SLP SHORT TERM GOAL #2   Title Organize/plan mildly complex  tasks/information with 80% accuracy and occassional minimal assistance   Time 3   Period Weeks   Status On-going   SLP SHORT TERM GOAL #3   Title Alternate attention between 2 mildly complex cognitive linguistic tasks with mild distractions 80% accuracy on each.   Time 3   Period Weeks          SLP Long Term Goals - 08/20/14 1457    SLP LONG TERM GOAL #1   Title Use external memory aids for daily chores, events, phone messages over 4 sessions with modified independence   Time 7   Period Weeks   Status On-going   SLP LONG TERM GOAL #2   Title Organize/plan moderately complex tasks/information with 90% accuracy and rare minimal assistance   Time 7   Period Weeks   Status On-going   SLP LONG TERM GOAL #3   Title Divide attention between 2 simple cognitive linguistic tasks with 80% on each and occassional minimal assistance   Time 7   Period Weeks   Status On-going          Plan - 08/20/14 1455    Clinical Impression Statement Pt required occassional min assistance to redirect to organization task with mild distractions. He required extended time and occasional min a for organization tasks. Short term recall reamins impaired. Recommendcontinue skilled ST to maximize infependence and redeuce caregiver burden                               Problem List Patient Active Problem List   Diagnosis Date Noted  . Cognitive deficit as late effect of traumatic brain injury 07/24/2014  . 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 Hypothyroid 11/11/2013  . Testosterone deficiency 11/11/2013  . Subdural hematoma 10/22/2013  . H/O craniotomy 04/30/2013  . Injury of right rotator cuff 02/28/2013  . Scrotal irritation 02/28/2013  . Closed fracture of facial bones 01/03/2013  . TBI (traumatic brain injury) 01/03/2013  . Pneumonia 01/03/2013  . Acute blood loss anemia 12/30/2012  . Subdural hemorrhage following injury 12/15/2012  .  Respiratory failure following trauma and surgery 12/15/2012  . Cardiac arrest 12/15/2012  . Blunt trauma of face 12/15/2012  . Blunt head trauma 12/15/2012  . Traumatic subarachnoid bleed with LOC of 30 minutes or less 12/15/2012  . Elevated ETOH level 12/15/2012    Lovvorn, Annye Rusk 08/20/2014, 2:58 PM  Georgiann Hahn, [place], [place] 08/20/2014 2:59 PM Phone: [phone] Fax: [phone]

## 2014-08-20 NOTE — Patient Instructions (Signed)
Pt to organize pantry, put like objects together Clean out expired objects  1 shelf a day   Chairs - 30 minutes to a hour 1x a day assemble  Use notebook to write down tasks that you need to put off for the moment

## 2014-08-24 ENCOUNTER — Telehealth: Payer: Self-pay | Admitting: Physical Medicine & Rehabilitation

## 2014-08-24 ENCOUNTER — Telehealth: Payer: Self-pay | Admitting: *Deleted

## 2014-08-24 NOTE — Telephone Encounter (Signed)
Spoke to Pt wife, passed on Dr. Charm Barges note, they are going to give it a try. They will call back if it does not work

## 2014-08-24 NOTE — Telephone Encounter (Signed)
Wife called needs a call back about patient's headaches.  Left a message on 12/11 and 12/14

## 2014-08-24 NOTE — Telephone Encounter (Signed)
Patient is having headaches for 10 days now... What can he do?  Please advise

## 2014-08-24 NOTE — Telephone Encounter (Signed)
He may try naproxen otc, 220mg  one q12 prn.  Perhaps we need to increase the keppra again? It may have been helping with h/a

## 2014-08-24 NOTE — Telephone Encounter (Signed)
i just answered this 4 hours ago. Please see last phone call

## 2014-08-24 NOTE — Telephone Encounter (Signed)
Pt complains of having a headache all week, tylenol is not working, He is asking if there is some other medication he could take. Pt also informs he has lowered his keppra dose to 500 mg in the morning and 750 mg at nite and the increase in exelon has caused nausea and vomiting, has reduced from 6 mg back to 3 mg

## 2014-08-24 NOTE — Telephone Encounter (Signed)
I called the pt, spoke with his wife, explained the note given by Dr. Naaman Plummer asking pt to try out Naproxen 220 mg and increasing keppra, will call back if it does not work

## 2014-08-26 ENCOUNTER — Ambulatory Visit: Payer: 59

## 2014-08-26 ENCOUNTER — Telehealth: Payer: Self-pay | Admitting: *Deleted

## 2014-08-26 DIAGNOSIS — R41841 Cognitive communication deficit: Secondary | ICD-10-CM

## 2014-08-26 MED ORDER — NAPROXEN 500 MG PO TABS: 500.0000 mg | ORAL_TABLET | Freq: Two times a day (BID) | ORAL | Status: DC

## 2014-08-26 NOTE — Patient Instructions (Addendum)
  Organize the other shelves of the pantry. Decide what is the best thing to do with the [place] food. Tell us how it went next time. Take a picture when you're done.  Give me the medication update in therapy next session.

## 2014-08-26 NOTE — Telephone Encounter (Signed)
I spoke with wife [name] and told her no MRI needed.  I reviewed what med he is currently taking.  Here is what he is taking:  Keppra 750 mg q am and 1500mg  q hs.  She said that is what he was taking in the hospital at one time and thought it was ok to increase.  The Exelon is the only thing she could pinpoint causing the nausea and he is currently taking 3 mg  Instead of the 6 mg he was increase to at visit in November.  As far as the naproxen goes, it was not making a lot of difference in the headache so she has been giving him (2) 500 mg tylenols about q 4 hours plus the naproxen bid.  The headache is lowered but not knocked out completely.  She was asking if there was something a little stronger that could be given.

## 2014-08-26 NOTE — Telephone Encounter (Signed)
This is a new phone call from pt's wife, she says the Aleve is not working either, asksing Dr. Naaman Plummer if her husband should get an MRI

## 2014-08-26 NOTE — Therapy (Signed)
[facility] [address] [place], [place], [address] Phone: [phone]   Fax:  [phone]  Speech Language Pathology Treatment  Patient Details  Name: Albert [name] MRN: [id number] Date of Birth: 1976-02-28  Encounter Date: 08/26/2014      End of Session - 08/26/14 1012    Visit Number 3   Number of Visits 16   Date for SLP Re-Evaluation 10/08/14   SLP Start Time 0934   SLP Stop Time  9024   SLP Time Calculation (min) 41 min      Past Medical History  Diagnosis Date  . Headache(784.0)   . Traumatic brain injury   . 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 Rotator cuff injury     right  . Stroke     some aphasia , weakness in the R arm , Out pt. rehab currently   . Swallowing problem     still relearning controlled swallowing - rec'ing speech therapy in rehab    Past Surgical History  Procedure Laterality Date  . Craniotomy Left 12/15/2012    Procedure: CRANIECTOMY HEMATOMA EVACUATION SUBDURAL WITH PLACEMENT OF BONE FLAP IN ABDOMINAL WALL;  Surgeon: Floyce Stakes, MD;  Location: [place] [facility];  Service: Neurosurgery;  Laterality: Left;  . Craniotomy Left 12/28/2012    Procedure: CRANIOTOMY HEMATOMA EVACUATION SUBDURAL;  Surgeon: Erline Levine, MD;  Location: [place] [facility];  Service: Neurosurgery;  Laterality: Left;  . Peg placement N/A 12/31/2012    Procedure: PERCUTANEOUS ENDOSCOPIC GASTROSTOMY (PEG) PLACEMENT;  Surgeon: Zenovia Jarred, MD;  Location: [place];  Service: General;  Laterality: N/A;  bedside  peg  . Percutaneous tracheostomy N/A 12/31/2012    Procedure: PERCUTANEOUS TRACHEOSTOMY (BEDSIDE);  Surgeon: Zenovia Jarred, MD;  Location: [place];  Service: General;  Laterality: N/A;  . Wrist surgery Right 2008  . Cranioplasty N/A 06/10/2013    Procedure: CRANIOPLASTY;  Surgeon: Floyce Stakes, MD;  Location: [facility];  Service: Neurosurgery;  Laterality: N/A;  CRANIOPLASTY    There were no vitals taken for this  visit.  Visit Diagnosis: Cognitive communication deficit      Subjective Assessment - 08/26/14 0947    Symptoms "I organized the first two shelves (of the pantry)." Pt unsure what to do with some ethnic food wife may not use on next shelf.            ADULT SLP TREATMENT - 08/26/14 0950    General Information   Behavior/Cognition Cooperative;Pleasant mood   Treatment Provided   Treatment provided Cognitive-Linquistic   Pain Assessment   Pain Assessment 0-10   Pain Score 6    Pain Location head   Pain Descriptors / Indicators Headache;Pounding   Pain Intervention(s) Monitored during session   Cognitive-Linquistic Treatment   Treatment focused on Cognition   Skilled Treatment Pt and SLP reasoned through how pt could handle the food his wife has bought in the pantry. SLP suggested pt set all food aside into one area  Pt was assisted with linguistic organization task. Extended time necessary during task. Pt exhibited decr'd alternating attention skills with SLP questions and alternating between calendar and writing information, requiring usual min A to regain place/reorient. Req'd SLP cue to recall need to bring in name and dosage of new med obtained since last ST visit. Pt wrote reminder in notepad independently.     Assessment / Recommendations / Plan   Plan Continue with current plan of care   Progression Toward  Goals   Progression toward goals Progressing toward goals            SLP Short Term Goals - 08/26/14 1016    SLP SHORT TERM GOAL #1   Title Pt will utlize external memory aids for daily chores, phone calls, and events, documenting date and time for these with occassional minimal asssitance   Time 3   Period Weeks   Status On-going   SLP SHORT TERM GOAL #2   Title Organize/plan mildly complex tasks/information with 80% accuracy and occassional minimal assistance   Period Weeks   Status On-going   SLP SHORT TERM GOAL #3   Title Alternate attention between 2  mildly complex cognitive linguistic tasks with mild distractions 80% accuracy on each.   Period Weeks   Status On-going          SLP Long Term Goals - 08/26/14 1017    SLP LONG TERM GOAL #1   Title Use external memory aids for daily chores, events, phone messages over 4 sessions with modified independence   Time 7   Period Weeks   Status On-going   SLP LONG TERM GOAL #2   Title Organize/plan moderately complex tasks/information with 90% accuracy and rare minimal assistance   Time 7   Period Weeks   Status On-going   SLP LONG TERM GOAL #3   Title Divide attention between 2 simple cognitive linguistic tasks with 80% on each and occassional minimal assistance   Time 7   Period Weeks   Status On-going          Plan - 08/26/14 1012    Clinical Impression Statement Req'd rare min A back to task after questioning from SLP (alternating attention). Pt reports he "double-triple checks" his work as he always used to do, but it takes him more time to do so post-TBI.   Speech Therapy Frequency 2x / week   Duration --  7 weeks   Treatment/Interventions Functional tasks;Patient/family education;Cueing hierarchy;Environmental controls;Cognitive reorganization;SLP instruction and feedback;Internal/external aids;Compensatory techniques   Potential to Achieve Goals Good   Potential Considerations Ability to learn/carryover information;Severity of impairments                               Problem List Patient Active Problem List   Diagnosis Date Noted  . Cognitive deficit as late effect of traumatic brain injury 07/24/2014  . 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 Hypothyroid 11/11/2013  . Testosterone deficiency 11/11/2013  . Subdural hematoma 10/22/2013  . H/O craniotomy 04/30/2013  . Injury of right rotator cuff 02/28/2013  . Scrotal irritation 02/28/2013  . Closed fracture of facial bones 01/03/2013  . TBI (traumatic brain  injury) 01/03/2013  . Pneumonia 01/03/2013  . Acute blood loss anemia 12/30/2012  . Subdural hemorrhage following injury 12/15/2012  . Respiratory failure following trauma and surgery 12/15/2012  . Cardiac arrest 12/15/2012  . Blunt trauma of face 12/15/2012  . Blunt head trauma 12/15/2012  . Traumatic subarachnoid bleed with LOC of 30 minutes or less 12/15/2012  . Elevated ETOH level 12/15/2012    Garald Balding, MS, [place]  08/26/2014 10:21 AM  Phone: [phone] Fax: [phone]

## 2014-08-26 NOTE — Telephone Encounter (Signed)
i have rx'ed naproxen 500mg  bid.  Also please back down on the tylenol to a max of 3000mg  a day

## 2014-08-27 DIAGNOSIS — S069X9S Unspecified intracranial injury with loss of consciousness of unspecified duration, sequela: Secondary | ICD-10-CM

## 2014-08-27 DIAGNOSIS — F068 Other specified mental disorders due to known physiological condition: Secondary | ICD-10-CM

## 2014-08-27 DIAGNOSIS — F4323 Adjustment disorder with mixed anxiety and depressed mood: Secondary | ICD-10-CM

## 2014-08-27 NOTE — Telephone Encounter (Signed)
I spoke again with Mrs Boney and gave her Dr Naaman Plummer reply about the tylenol She says his headaches are a little more manageable at the present time.

## 2014-08-27 NOTE — Telephone Encounter (Signed)
You mentioned topamax yesterday when talking about him--were you going to prescribe that? Andyou are saying it is ok for him to stay at the 750/1500mg  keppra schedule she has him on?

## 2014-08-28 ENCOUNTER — Ambulatory Visit: Payer: 59

## 2014-08-28 DIAGNOSIS — R41841 Cognitive communication deficit: Secondary | ICD-10-CM

## 2014-08-28 NOTE — Therapy (Signed)
[facility] [address] [place], [place], [address] Phone: [phone]   Fax:  [phone]  Speech Language Pathology Treatment  Patient Details  Name: Albert [name] MRN: [id number] Date of Birth: March 05, 1976  Encounter Date: 08/28/2014      End of Session - 08/28/14 1243    Visit Number 4   Number of Visits 16   Date for SLP Re-Evaluation 10/08/14   SLP Start Time 6226   SLP Stop Time  1230   SLP Time Calculation (min) 48 min      Past Medical History  Diagnosis Date  . Headache(784.0)   . Traumatic brain injury   . 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 Rotator cuff injury     right  . Stroke     some aphasia , weakness in the R arm , Out pt. rehab currently   . Swallowing problem     still relearning controlled swallowing - rec'ing speech therapy in rehab    Past Surgical History  Procedure Laterality Date  . Craniotomy Left 12/15/2012    Procedure: CRANIECTOMY HEMATOMA EVACUATION SUBDURAL WITH PLACEMENT OF BONE FLAP IN ABDOMINAL WALL;  Surgeon: Floyce Stakes, MD;  Location: [place] [facility];  Service: Neurosurgery;  Laterality: Left;  . Craniotomy Left 12/28/2012    Procedure: CRANIOTOMY HEMATOMA EVACUATION SUBDURAL;  Surgeon: Erline Levine, MD;  Location: [place] [facility];  Service: Neurosurgery;  Laterality: Left;  . Peg placement N/A 12/31/2012    Procedure: PERCUTANEOUS ENDOSCOPIC GASTROSTOMY (PEG) PLACEMENT;  Surgeon: Zenovia Jarred, MD;  Location: [place];  Service: General;  Laterality: N/A;  bedside  peg  . Percutaneous tracheostomy N/A 12/31/2012    Procedure: PERCUTANEOUS TRACHEOSTOMY (BEDSIDE);  Surgeon: Zenovia Jarred, MD;  Location: [place];  Service: General;  Laterality: N/A;  . Wrist surgery Right 2008  . Cranioplasty N/A 06/10/2013    Procedure: CRANIOPLASTY;  Surgeon: Floyce Stakes, MD;  Location: [facility];  Service: Neurosurgery;  Laterality: N/A;  CRANIOPLASTY    There were no vitals taken  for this visit.  Visit Diagnosis: Cognitive communication deficit      Subjective Assessment - 08/28/14 1150    Symptoms (p) Pantry is organized now.    Currently in Pain? (p) Yes             ADULT SLP TREATMENT - 08/28/14 1207    General Information   Behavior/Cognition Cooperative;Pleasant mood   Treatment Provided   Treatment provided Cognitive-Linquistic   Pain Assessment   Pain Assessment 0-10   Pain Score 3    Pain Location head   Pain Descriptors / Indicators Headache   Pain Intervention(s) Monitored during session   Cognitive-Linquistic Treatment   Treatment focused on Cognition   Skilled Treatment SLP offered assistance to pt and wife re: pt's perseverance re:  changing plans throughout the day (e.g., dinner plans). Pt completed divided attention task (auditory and written task) with 50% success (auditory) and 70% success (written).   Assessment / Recommendations / Plan   Plan Continue with current plan of care   Progression Toward Goals   Progression toward goals Progressing toward goals          SLP Education - 08/28/14 1242    Education provided Yes   Education Details strategies for memory/confirmation of hearing things correctly at home - a note pad   Person(s) Educated Patient;Spouse   Methods Explanation   Comprehension Verbalized understanding  SLP Short Term Goals - 08/28/14 1245    SLP SHORT TERM GOAL #1   Title Pt will utlize external memory aids for daily chores, phone calls, and events, documenting date and time for these with occassional minimal asssitance   Time 2   Period Weeks   Status On-going   SLP SHORT TERM GOAL #2   Title Organize/plan mildly complex tasks/information with 80% accuracy and occassional minimal assistance   Time 2   Period Weeks   Status On-going   SLP SHORT TERM GOAL #3   Title Alternate attention between 2 mildly complex cognitive linguistic tasks with mild distractions 80% accuracy on each.   Time  2   Period Weeks   Status On-going          SLP Long Term Goals - 08/28/14 1246    SLP LONG TERM GOAL #1   Title Use external memory aids for daily chores, events, phone messages over 4 sessions with modified independence   Time 6   Period Weeks   Status On-going   SLP LONG TERM GOAL #2   Title Organize/plan moderately complex tasks/information with 90% accuracy and rare minimal assistance   Time 6   Period Weeks   Status On-going   SLP LONG TERM GOAL #3   Title Divide attention between 2 simple cognitive linguistic tasks with 80% on each and occassional minimal assistance   Time 6   Period Weeks   Status On-going          Plan - 08/28/14 1243    Clinical Impression Statement Req'd usual min A in divided attention task today. Pt with decr'd STM as he asked SLP  what the question was x2.   Speech Therapy Frequency 2x / week   Duration --  6 weeks   Treatment/Interventions Functional tasks;Patient/family education;Cueing hierarchy;Environmental controls;Cognitive reorganization;SLP instruction and feedback;Internal/external aids;Compensatory techniques   Potential to Achieve Goals Good   Potential Considerations Ability to learn/carryover information;Severity of impairments        Problem List Patient Active Problem List   Diagnosis Date Noted  . Cognitive deficit as late effect of traumatic brain injury 07/24/2014  . 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 Hypothyroid 11/11/2013  . Testosterone deficiency 11/11/2013  . Subdural hematoma 10/22/2013  . H/O craniotomy 04/30/2013  . Injury of right rotator cuff 02/28/2013  . Scrotal irritation 02/28/2013  . Closed fracture of facial bones 01/03/2013  . TBI (traumatic brain injury) 01/03/2013  . Pneumonia 01/03/2013  . Acute blood loss anemia 12/30/2012  . Subdural hemorrhage following injury 12/15/2012  . Respiratory failure following trauma and surgery 12/15/2012  . Cardiac arrest  12/15/2012  . Blunt trauma of face 12/15/2012  . Blunt head trauma 12/15/2012  . Traumatic subarachnoid bleed with LOC of 30 minutes or less 12/15/2012  . Elevated ETOH level 12/15/2012    [facility] 08/28/2014, 12:48 PM  [facility] [address] [place], [place], [address] Phone: [phone]   Fax:  [phone]

## 2014-08-28 NOTE — Patient Instructions (Signed)
  Please complete the assigned speech therapy homework and return it to your next session. Write things down on a pad so that you know you have heard them correctly, instead of double-triple checking with [place].

## 2014-09-01 ENCOUNTER — Ambulatory Visit: Payer: 59 | Admitting: Speech Pathology

## 2014-09-08 ENCOUNTER — Ambulatory Visit: Payer: 59

## 2014-09-08 DIAGNOSIS — R41841 Cognitive communication deficit: Secondary | ICD-10-CM | POA: Diagnosis not present

## 2014-09-08 NOTE — Patient Instructions (Signed)
  Please organize your closet or the coat closet downstairs tonight for homework.

## 2014-09-08 NOTE — Therapy (Signed)
[facility] [address] [place], [place], [address] Phone: [phone]   Fax:  [phone]  Speech Language Pathology Treatment  Patient Details  Name: Albert [name] MRN: [id number] Date of Birth: [birth year]  Encounter Date: 09/08/2014      End of Session - 09/08/14 1702    Visit Number 5   Number of Visits 16   Date for SLP Re-Evaluation 10/08/14   SLP Start Time 1149   SLP Stop Time  1233   SLP Time Calculation (min) 44 min      Past Medical History  Diagnosis Date  . Headache(784.0)   . Traumatic brain injury   . 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 Rotator cuff injury     right  . Stroke     some aphasia , weakness in the R arm , Out pt. rehab currently   . Swallowing problem     still relearning controlled swallowing - rec'ing speech therapy in rehab    Past Surgical History  Procedure Laterality Date  . Craniotomy Left 12/15/2012    Procedure: CRANIECTOMY HEMATOMA EVACUATION SUBDURAL WITH PLACEMENT OF BONE FLAP IN ABDOMINAL WALL;  Surgeon: Floyce Stakes, MD;  Location: [facility];  Service: Neurosurgery;  Laterality: Left;  . Craniotomy Left 12/28/2012    Procedure: CRANIOTOMY HEMATOMA EVACUATION SUBDURAL;  Surgeon: Erline Levine, MD;  Location: [facility];  Service: Neurosurgery;  Laterality: Left;  . Peg placement N/A 12/31/2012    Procedure: PERCUTANEOUS ENDOSCOPIC GASTROSTOMY (PEG) PLACEMENT;  Surgeon: Zenovia Jarred, MD;  Location: [facility];  Service: General;  Laterality: N/A;  bedside  peg  . Percutaneous tracheostomy N/A 12/31/2012    Procedure: PERCUTANEOUS TRACHEOSTOMY (BEDSIDE);  Surgeon: Zenovia Jarred, MD;  Location: [facility];  Service: General;  Laterality: N/A;  . Wrist surgery Right 2008  . Cranioplasty N/A 06/10/2013    Procedure: CRANIOPLASTY;  Surgeon: Floyce Stakes, MD;  Location: [facility];  Service: Neurosurgery;  Laterality: N/A;  CRANIOPLASTY    There were no vitals taken  for this visit.  Visit Diagnosis: Cognitive communication deficit      Subjective Assessment - 09/08/14 1449    Symptoms Pt remarked x2 he is not organizing wife's closet.             ADULT SLP TREATMENT - 09/08/14 1449    General Information   Behavior/Cognition Cooperative;Pleasant mood   Treatment Provided   Treatment provided Cognitive-Linquistic   Pain Assessment   Pain Assessment No/denies pain   Cognitive-Linquistic Treatment   Treatment focused on Cognition   Skilled Treatment Pt wrote tonight's homework down. He used SLP's computer to comparitive shop/executive function. Pt req'd min a for navigation on computer screen. Wife suggested pt search for shoes and pt rolled eyes after her suggestion. Pt req'd extra time to perform [employer] for shoes and for ways to decr search field.     Assessment / Recommendations / Plan   Plan Continue with current plan of care   Progression Toward Goals   Progression toward goals Progressing toward goals            SLP Short Term Goals - 09/08/14 1703    SLP SHORT TERM GOAL #1   Title Pt will utlize external memory aids for daily chores, phone calls, and events, documenting date and time for these with occassional minimal asssitance   Time 2   Period Weeks   Status Achieved  SLP SHORT TERM GOAL #2   Title Organize/plan mildly complex tasks/information with 80% accuracy and occassional minimal assistance   Time 2   Period Weeks   Status On-going   SLP SHORT TERM GOAL #3   Title Alternate attention between 2 mildly complex cognitive linguistic tasks with mild distractions 80% accuracy on each.   Time 2   Period Weeks   Status On-going          SLP Long Term Goals - 09/08/14 1704    SLP LONG TERM GOAL #1   Title Use external memory aids for daily chores, events, phone messages over 4 sessions with modified independence   Time 6   Period Weeks   Status On-going   SLP LONG TERM GOAL #2   Title Organize/plan  moderately complex tasks/information with 90% accuracy and rare minimal assistance   Time 6   Period Weeks   Status On-going   SLP LONG TERM GOAL #3   Title Divide attention between 2 simple cognitive linguistic tasks with 80% on each and occassional minimal assistance   Time 6   Period Weeks   Status On-going          Plan - 09/08/14 1702    Clinical Impression Statement Pt with noted decr'd memory for street names and businesses.    Speech Therapy Frequency 2x / week   Duration --  5 weeks   Treatment/Interventions Functional tasks;Patient/family education;Cueing hierarchy;Environmental controls;Cognitive reorganization;SLP instruction and feedback;Internal/external aids;Compensatory techniques   Potential to Achieve Goals Good   Potential Considerations Ability to learn/carryover information;Severity of impairments        Problem List Patient Active Problem List   Diagnosis Date Noted  . Cognitive deficit as late effect of traumatic brain injury 07/24/2014  . 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 Hypothyroid 11/11/2013  . Testosterone deficiency 11/11/2013  . Subdural hematoma 10/22/2013  . H/O craniotomy 04/30/2013  . Injury of right rotator cuff 02/28/2013  . Scrotal irritation 02/28/2013  . Closed fracture of facial bones 01/03/2013  . TBI (traumatic brain injury) 01/03/2013  . Pneumonia 01/03/2013  . Acute blood loss anemia 12/30/2012  . Subdural hemorrhage following injury 12/15/2012  . Respiratory failure following trauma and surgery 12/15/2012  . Cardiac arrest 12/15/2012  . Blunt trauma of face 12/15/2012  . Blunt head trauma 12/15/2012  . Traumatic subarachnoid bleed with LOC of 30 minutes or less 12/15/2012  . Elevated ETOH level 12/15/2012    [facility], SLP 09/08/2014, 5:06 PM  [facility] [address] [place], [place], [address] Phone: [phone]    Fax:  [phone]

## 2014-09-09 ENCOUNTER — Ambulatory Visit: Payer: 59

## 2014-09-09 DIAGNOSIS — R41841 Cognitive communication deficit: Secondary | ICD-10-CM | POA: Diagnosis not present

## 2014-09-09 NOTE — Therapy (Signed)
[facility] [address] [place], [place], [address] Phone: [phone]   Fax:  [phone]  Speech Language Pathology Treatment  Patient Details  Name: Albert [name] MRN: [id number] Date of Birth: 09-Jan-1976  Encounter Date: 09/09/2014      End of Session - 09/09/14 1522    Visit Number 6   Number of Visits 16   Date for SLP Re-Evaluation 10/08/14   SLP Start Time 3785   SLP Stop Time  1230   SLP Time Calculation (min) 41 min      Past Medical History  Diagnosis Date  . Headache(784.0)   . Traumatic brain injury   . 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 Rotator cuff injury     right  . Stroke     some aphasia , weakness in the R arm , Out pt. rehab currently   . Swallowing problem     still relearning controlled swallowing - rec'ing speech therapy in rehab    Past Surgical History  Procedure Laterality Date  . Craniotomy Left 12/15/2012    Procedure: CRANIECTOMY HEMATOMA EVACUATION SUBDURAL WITH PLACEMENT OF BONE FLAP IN ABDOMINAL WALL;  Surgeon: Floyce Stakes, MD;  Location: [facility];  Service: Neurosurgery;  Laterality: Left;  . Craniotomy Left 12/28/2012    Procedure: CRANIOTOMY HEMATOMA EVACUATION SUBDURAL;  Surgeon: Erline Levine, MD;  Location: [facility];  Service: Neurosurgery;  Laterality: Left;  . Peg placement N/A 12/31/2012    Procedure: PERCUTANEOUS ENDOSCOPIC GASTROSTOMY (PEG) PLACEMENT;  Surgeon: Zenovia Jarred, MD;  Location: [facility];  Service: General;  Laterality: N/A;  bedside  peg  . Percutaneous tracheostomy N/A 12/31/2012    Procedure: PERCUTANEOUS TRACHEOSTOMY (BEDSIDE);  Surgeon: Zenovia Jarred, MD;  Location: [facility];  Service: General;  Laterality: N/A;  . Wrist surgery Right 2008  . Cranioplasty N/A 06/10/2013    Procedure: CRANIOPLASTY;  Surgeon: Floyce Stakes, MD;  Location: [facility];  Service: Neurosurgery;  Laterality: N/A;  CRANIOPLASTY    There were no vitals taken  for this visit.  Visit Diagnosis: Cognitive communication deficit      Subjective Assessment - 09/09/14 1156    Currently in Pain? No/denies             ADULT SLP TREATMENT - 09/09/14 1157    General Information   Behavior/Cognition Cooperative;Agitated   Treatment Provided   Treatment provided Cognitive-Linquistic   Pain Assessment   Pain Assessment No/denies pain   Cognitive-Linquistic Treatment   Treatment focused on Cognition   Skilled Treatment Pt wrote tonight's homework down again. He spent today comparison shopping with distraction of daughter in therapy room, conversation between SLP and family, and SLP and pt. Skilled observation from SLP used - pt required min A rarely back to task for shoe shopping online. Perseverated on wife's closet needing to be cleaned.   Assessment / Recommendations / Plan   Plan Continue with current plan of care   Progression Toward Goals   Progression toward goals Progressing toward goals            SLP Short Term Goals - 09/09/14 1524    SLP SHORT TERM GOAL #1   Title Pt will utlize external memory aids for daily chores, phone calls, and events, documenting date and time for these with occassional minimal asssitance   Status Achieved   SLP SHORT TERM GOAL #2   Title Organize/plan mildly complex tasks/information with 80% accuracy and  occassional minimal assistance   Status Achieved   SLP SHORT TERM GOAL #3   Title Alternate attention between 2 mildly complex cognitive linguistic tasks with mild distractions 80% accuracy on each.   Time 1  STG not met yet. Cont goal.   Period Weeks  goal enforced until 8 visits   Status On-going          SLP Long Term Goals - 09/09/14 1525    SLP LONG TERM GOAL #1   Title Use external memory aids for daily chores, events, phone messages over 4 sessions with modified independence   Time 4   Period Weeks   Status On-going   SLP LONG TERM GOAL #2   Title Organize/plan moderately complex  tasks/information with 90% accuracy and rare minimal assistance   Period Weeks   Status Achieved   SLP LONG TERM GOAL #3   Title Divide attention between 2 simple cognitive linguistic tasks with 75% on each and occassional minimal assistance   Time 4   Period Weeks   Status On-going          Plan - 09/09/14 1522    Clinical Impression Statement Pt continues with attention deficits hindering processing speed in linguistic tasks.    Speech Therapy Frequency 2x / week   Duration 4 weeks   Treatment/Interventions Functional tasks;Patient/family education;Cueing hierarchy;Environmental controls;Cognitive reorganization;SLP instruction and feedback;Internal/external aids;Compensatory techniques   Potential to Achieve Goals Good   Potential Considerations Ability to learn/carryover information;Severity of impairments        Problem List Patient Active Problem List   Diagnosis Date Noted  . Cognitive deficit as late effect of traumatic brain injury 07/24/2014  . 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 Hypothyroid 11/11/2013  . Testosterone deficiency 11/11/2013  . Subdural hematoma 10/22/2013  . H/O craniotomy 04/30/2013  . Injury of right rotator cuff 02/28/2013  . Scrotal irritation 02/28/2013  . Closed fracture of facial bones 01/03/2013  . TBI (traumatic brain injury) 01/03/2013  . Pneumonia 01/03/2013  . Acute blood loss anemia 12/30/2012  . Subdural hemorrhage following injury 12/15/2012  . Respiratory failure following trauma and surgery 12/15/2012  . Cardiac arrest 12/15/2012  . Blunt trauma of face 12/15/2012  . Blunt head trauma 12/15/2012  . Traumatic subarachnoid bleed with LOC of 30 minutes or less 12/15/2012  . Elevated ETOH level 12/15/2012    [facility], SLP 09/09/2014, 3:31 PM  [facility] [address] [place], [place], [address] Phone: [phone]   Fax:   [phone]

## 2014-09-15 ENCOUNTER — Ambulatory Visit: Payer: 59 | Attending: Physical Medicine & Rehabilitation

## 2014-09-15 DIAGNOSIS — F068 Other specified mental disorders due to known physiological condition: Secondary | ICD-10-CM

## 2014-09-15 DIAGNOSIS — R41841 Cognitive communication deficit: Secondary | ICD-10-CM | POA: Diagnosis not present

## 2014-09-15 DIAGNOSIS — S069X9S Unspecified intracranial injury with loss of consciousness of unspecified duration, sequela: Secondary | ICD-10-CM

## 2014-09-15 DIAGNOSIS — F4323 Adjustment disorder with mixed anxiety and depressed mood: Secondary | ICD-10-CM

## 2014-09-15 NOTE — Therapy (Signed)
[facility] [address] [place], [place], [address] Phone: [phone]   Fax:  [phone]  Speech Language Pathology Treatment  Patient Details  Name: Albert [name] MRN: [id number] Date of Birth: [birth year]  Encounter Date: 09/15/2014      End of Session - 09/15/14 1340    Visit Number 7   Number of Visits 16   Date for SLP Re-Evaluation 10/08/14   SLP Start Time 0933   SLP Stop Time  1021   SLP Time Calculation (min) 48 min   Activity Tolerance Treatment limited secondary to agitation      Past Medical History  Diagnosis Date  . Headache(784.0)   . Traumatic brain injury   . 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 Rotator cuff injury     right  . Stroke     some aphasia , weakness in the R arm , Out pt. rehab currently   . Swallowing problem     still relearning controlled swallowing - rec'ing speech therapy in rehab    Past Surgical History  Procedure Laterality Date  . Craniotomy Left 12/15/2012    Procedure: CRANIECTOMY HEMATOMA EVACUATION SUBDURAL WITH PLACEMENT OF BONE FLAP IN ABDOMINAL WALL;  Surgeon: Floyce Stakes, MD;  Location: [facility];  Service: Neurosurgery;  Laterality: Left;  . Craniotomy Left 12/28/2012    Procedure: CRANIOTOMY HEMATOMA EVACUATION SUBDURAL;  Surgeon: Erline Levine, MD;  Location: [facility];  Service: Neurosurgery;  Laterality: Left;  . Peg placement N/A 12/31/2012    Procedure: PERCUTANEOUS ENDOSCOPIC GASTROSTOMY (PEG) PLACEMENT;  Surgeon: Zenovia Jarred, MD;  Location: [facility];  Service: General;  Laterality: N/A;  bedside  peg  . Percutaneous tracheostomy N/A 12/31/2012    Procedure: PERCUTANEOUS TRACHEOSTOMY (BEDSIDE);  Surgeon: Zenovia Jarred, MD;  Location: [facility];  Service: General;  Laterality: N/A;  . Wrist surgery Right 2008  . Cranioplasty N/A 06/10/2013    Procedure: CRANIOPLASTY;  Surgeon: Floyce Stakes, MD;  Location: [facility];  Service: Neurosurgery;   Laterality: N/A;  CRANIOPLASTY    There were no vitals taken for this visit.  Visit Diagnosis: Cognitive communication deficit      Subjective Assessment - 09/15/14 0938    Symptoms Pt arrived with wife to therapy.   Currently in Pain? No/denies             ADULT SLP TREATMENT - 09/15/14 0938    General Information   Behavior/Cognition Alert;Pleasant mood   Treatment Provided   Treatment provided Cognitive-Linquistic   Pain Assessment   Pain Assessment No/denies pain   Cognitive-Linquistic Treatment   Treatment focused on Cognition   Skilled Treatment SLP suggestion re: pt writing down information he may question later/perseverates on is not being completed at home due to not being habitualized. SLP suggested this be one focus of subsequent sessions. Alternating attention when making brownies WFL/WNL 90% success given extra time consistently. Pt did not particpate in divided attention (conversation) while preparing brownies. Organization skills appeared functional in a non-familiar kitchen, given consistent extra time. [name] remarked pt will often burn the last few batches of pancakes due to his involvement with competing tasks (washing dishes, cleaning up ingredients, etc). SLP explained that appeared as divided attention deficit. Pt visibly upset by wife sharing this information, saying, "What the (expletive)? You're treating me like your (expletive) chef." SLP explained this was necessary so SLP knows things to attempt/work on in therapy to best treat  pt. SLP also referred the couple to discuss this with Dr. Valentina Shaggy.   Assessment / Recommendations / Plan   Plan Continue with current plan of care   Progression Toward Goals   Progression toward goals Progressing toward goals            SLP Short Term Goals - 09/15/14 1345    SLP SHORT TERM GOAL #1   Title Pt will utlize external memory aids for daily chores, phone calls, and events, documenting date and time for these  with occassional minimal asssitance   Status Achieved   SLP SHORT TERM GOAL #2   Title Organize/plan mildly complex tasks/information with 80% accuracy and occassional minimal assistance   Status Achieved   SLP SHORT TERM GOAL #3   Title Alternate attention between 2 mildly complex cognitive linguistic tasks with mild distractions 80% accuracy on each.   Period --  goal enforced until 8 visits   Status Achieved          SLP Long Term Goals - 09/15/14 1342    SLP LONG TERM GOAL #1   Title Use external memory aids for daily chores, events, phone messages over 4 sessions with modified independence   Time 4   Period Weeks   Status On-going   SLP LONG TERM GOAL #2   Title Organize/plan moderately complex tasks/information with 90% accuracy and rare minimal assistance   Period Weeks   Status Achieved   SLP LONG TERM GOAL #3   Title Divide attention between 2 simple cognitive linguistic tasks with 75% on each and occassional minimal assistance   Time 4   Period Weeks   Status On-going          Plan - 09/15/14 1341    Clinical Impression Statement Pt continues with attention deficits hindering processing speed in linguistic tasks.    Speech Therapy Frequency 2x / week   Duration 4 weeks   Treatment/Interventions Functional tasks;Patient/family education;Cueing hierarchy;Environmental controls;Cognitive reorganization;SLP instruction and feedback;Internal/external aids;Compensatory techniques   Potential to Achieve Goals Fair   Potential Considerations Ability to learn/carryover information;Severity of impairments        Problem List Patient Active Problem List   Diagnosis Date Noted  . Cognitive deficit as late effect of traumatic brain injury 07/24/2014  . 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 Hypothyroid 11/11/2013  . Testosterone deficiency 11/11/2013  . Subdural hematoma 10/22/2013  . H/O craniotomy 04/30/2013  . Injury of right  rotator cuff 02/28/2013  . Scrotal irritation 02/28/2013  . Closed fracture of facial bones 01/03/2013  . TBI (traumatic brain injury) 01/03/2013  . Pneumonia 01/03/2013  . Acute blood loss anemia 12/30/2012  . Subdural hemorrhage following injury 12/15/2012  . Respiratory failure following trauma and surgery 12/15/2012  . Cardiac arrest 12/15/2012  . Blunt trauma of face 12/15/2012  . Blunt head trauma 12/15/2012  . Traumatic subarachnoid bleed with LOC of 30 minutes or less 12/15/2012  . Elevated ETOH level 12/15/2012    Janecia Palau, SLP 09/15/2014, 1:47 PM  [facility] [address] [place], [place], [address] Phone: [phone]   Fax:  [phone]

## 2014-09-17 ENCOUNTER — Ambulatory Visit: Payer: 59 | Admitting: Speech Pathology

## 2014-09-17 DIAGNOSIS — R41841 Cognitive communication deficit: Secondary | ICD-10-CM

## 2014-09-17 NOTE — Patient Instructions (Signed)
Continue writing down answers to repeated questions,, consider a call/question log to document when you call or ask questions.  Continue to write down and schedule time for tasks, chores that you need to do at a more appropriate time.

## 2014-09-17 NOTE — Therapy (Signed)
[facility] [address] [place], [place], [address] Phone: [phone]   Fax:  [phone]  Speech Language Pathology Treatment  Patient Details  Name: Albert [name] MRN: [id number] Date of Birth: 1976/05/06 Referring Provider:  Meredith Staggers, MD  Encounter Date: 09/17/2014      End of Session - 09/17/14 1017    SLP Start Time 0930   SLP Stop Time  6384   SLP Time Calculation (min) 48 min      Past Medical History  Diagnosis Date  . Headache(784.0)   . Traumatic brain injury   . 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 Rotator cuff injury     right  . Stroke     some aphasia , weakness in the R arm , Out pt. rehab currently   . Swallowing problem     still relearning controlled swallowing - rec'ing speech therapy in rehab    Past Surgical History  Procedure Laterality Date  . Craniotomy Left 12/15/2012    Procedure: CRANIECTOMY HEMATOMA EVACUATION SUBDURAL WITH PLACEMENT OF BONE FLAP IN ABDOMINAL WALL;  Surgeon: Floyce Stakes, MD;  Location: [facility];  Service: Neurosurgery;  Laterality: Left;  . Craniotomy Left 12/28/2012    Procedure: CRANIOTOMY HEMATOMA EVACUATION SUBDURAL;  Surgeon: Erline Levine, MD;  Location: [facility];  Service: Neurosurgery;  Laterality: Left;  . Peg placement N/A 12/31/2012    Procedure: PERCUTANEOUS ENDOSCOPIC GASTROSTOMY (PEG) PLACEMENT;  Surgeon: Zenovia Jarred, MD;  Location: [facility];  Service: General;  Laterality: N/A;  bedside  peg  . Percutaneous tracheostomy N/A 12/31/2012    Procedure: PERCUTANEOUS TRACHEOSTOMY (BEDSIDE);  Surgeon: Zenovia Jarred, MD;  Location: [facility];  Service: General;  Laterality: N/A;  . Wrist surgery Right 2008  . Cranioplasty N/A 06/10/2013    Procedure: CRANIOPLASTY;  Surgeon: Floyce Stakes, MD;  Location: [facility];  Service: Neurosurgery;  Laterality: N/A;  CRANIOPLASTY    There were no vitals taken for this visit.  Visit Diagnosis:  Cognitive communication deficit      Subjective Assessment - 09/17/14 0941    Symptoms "It's not just me, it's Luana too"   Currently in Pain? Yes   Pain Score 4    Pain Descriptors / Indicators Headache   Pain Type Chronic pain   Pain Onset Today   Pain Frequency Occasional   Pain Relieving Factors tylenol             ADULT SLP TREATMENT - 09/17/14 0943    General Information   Behavior/Cognition Alert;Pleasant mood   Treatment Provided   Treatment provided Cognitive-Linquistic   Cognitive-Linquistic Treatment   Treatment focused on Cognition   Skilled Treatment Spouse not present - Pt reports writing down answers to questions he has for wife, such as what he needs to make for dinner. Consider logging each phone call or times he questions wife. Divided attention with mildly complex math word problems and adding up cards ST deals and participating in simple conversation.  Extended time, pt caught error I, rare min A for word problems with  %75 , and 100% on cards. Pt verbalizing frustration with family second guessing him consistently.   Assessment / Recommendations / Plan   Plan Continue with current plan of care   Progression Toward Goals   Progression toward goals Progressing toward goals          SLP Education - 09/17/14 1015    Education provided  Yes   Education Details memory strategies for repeated questions   Methods Explanation   Comprehension Verbalized understanding          SLP Short Term Goals - 09/15/14 1345    SLP SHORT TERM GOAL #1   Title Pt will utlize external memory aids for daily chores, phone calls, and events, documenting date and time for these with occassional minimal asssitance   Status Achieved   SLP SHORT TERM GOAL #2   Title Organize/plan mildly complex tasks/information with 80% accuracy and occassional minimal assistance   Status Achieved   SLP SHORT TERM GOAL #3   Title Alternate attention between 2 mildly complex cognitive  linguistic tasks with mild distractions 80% accuracy on each.   Period --  goal enforced until 8 visits   Status Achieved          SLP Long Term Goals - 09/15/14 1342    SLP LONG TERM GOAL #1   Title Use external memory aids for daily chores, events, phone messages over 4 sessions with modified independence   Time 4   Period Weeks   Status On-going   SLP LONG TERM GOAL #2   Title Organize/plan moderately complex tasks/information with 90% accuracy and rare minimal assistance   Period Weeks   Status Achieved   SLP LONG TERM GOAL #3   Title Divide attention between 2 simple cognitive linguistic tasks with 75% on each and occassional minimal assistance   Time 4   Period Weeks   Status On-going          Plan - 09/17/14 1016    Clinical Impression Statement Pt continues with attention deficits hindering processing speed in linguistic tasks.    Speech Therapy Frequency 2x / week   Duration 4 weeks   Treatment/Interventions Functional tasks;Patient/family education;Cueing hierarchy;Environmental controls;Cognitive reorganization;SLP instruction and feedback;Internal/external aids;Compensatory techniques   Potential to Achieve Goals Fair   Potential Considerations Ability to learn/carryover information;Severity of impairments   Consulted and Agree with Plan of Care Patient        Problem List Patient Active Problem List   Diagnosis Date Noted  . Cognitive deficit as late effect of traumatic brain injury 07/24/2014  . 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 Hypothyroid 11/11/2013  . Testosterone deficiency 11/11/2013  . Subdural hematoma 10/22/2013  . H/O craniotomy 04/30/2013  . Injury of right rotator cuff 02/28/2013  . Scrotal irritation 02/28/2013  . Closed fracture of facial bones 01/03/2013  . TBI (traumatic brain injury) 01/03/2013  . Pneumonia 01/03/2013  . Acute blood loss anemia 12/30/2012  . Subdural hemorrhage following injury  12/15/2012  . Respiratory failure following trauma and surgery 12/15/2012  . Cardiac arrest 12/15/2012  . Blunt trauma of face 12/15/2012  . Blunt head trauma 12/15/2012  . Traumatic subarachnoid bleed with LOC of 30 minutes or less 12/15/2012  . Elevated ETOH level 12/15/2012    Lovvorn, Annye Rusk, SLP 09/17/2014, 10:22 AM  [facility] [address] [place], [place], [address] Phone: [phone]   Fax:  [phone]

## 2014-09-22 ENCOUNTER — Ambulatory Visit: Payer: 59 | Admitting: Speech Pathology

## 2014-09-22 DIAGNOSIS — R41841 Cognitive communication deficit: Secondary | ICD-10-CM

## 2014-09-22 NOTE — Therapy (Signed)
[facility] [address] [place], [place], [address] Phone: [phone]   Fax:  [phone]  Speech Language Pathology Treatment  Patient Details  Name: Albert [name] MRN: [id number] Date of Birth: 1976-03-04 Referring Provider:  Meredith Staggers, MD  Encounter Date: 09/22/2014      End of Session - 09/22/14 1020    Visit Number 9   Number of Visits 16   Date for SLP Re-Evaluation 10/08/14   SLP Start Time 0932   SLP Stop Time  35   SLP Time Calculation (min) 47 min      Past Medical History  Diagnosis Date  . Headache(784.0)   . Traumatic brain injury   . 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 Rotator cuff injury     right  . Stroke     some aphasia , weakness in the R arm , Out pt. rehab currently   . Swallowing problem     still relearning controlled swallowing - rec'ing speech therapy in rehab    Past Surgical History  Procedure Laterality Date  . Craniotomy Left 12/15/2012    Procedure: CRANIECTOMY HEMATOMA EVACUATION SUBDURAL WITH PLACEMENT OF BONE FLAP IN ABDOMINAL WALL;  Surgeon: Floyce Stakes, MD;  Location: [facility];  Service: Neurosurgery;  Laterality: Left;  . Craniotomy Left 12/28/2012    Procedure: CRANIOTOMY HEMATOMA EVACUATION SUBDURAL;  Surgeon: Erline Levine, MD;  Location: [facility];  Service: Neurosurgery;  Laterality: Left;  . Peg placement N/A 12/31/2012    Procedure: PERCUTANEOUS ENDOSCOPIC GASTROSTOMY (PEG) PLACEMENT;  Surgeon: Zenovia Jarred, MD;  Location: [facility];  Service: General;  Laterality: N/A;  bedside  peg  . Percutaneous tracheostomy N/A 12/31/2012    Procedure: PERCUTANEOUS TRACHEOSTOMY (BEDSIDE);  Surgeon: Zenovia Jarred, MD;  Location: [facility];  Service: General;  Laterality: N/A;  . Wrist surgery Right 2008  . Cranioplasty N/A 06/10/2013    Procedure: CRANIOPLASTY;  Surgeon: Floyce Stakes, MD;  Location: [facility];  Service: Neurosurgery;  Laterality: N/A;   CRANIOPLASTY    There were no vitals taken for this visit.  Visit Diagnosis: Cognitive communication deficit      Subjective Assessment - 09/22/14 0935    Symptoms "I've been writing it down - trying to get used to using it is the problem"   Currently in Pain? No/denies             ADULT SLP TREATMENT - 09/22/14 0936    General Information   Behavior/Cognition Alert;Pleasant mood   Treatment Provided   Treatment provided Cognitive-Linquistic   Cognitive-Linquistic Treatment   Treatment focused on Cognition   Skilled Treatment Spouse not present, he states that he has reduced phone calls to his wife at work to 2x a day to check what time she will be home.  He reports that his frustrations are high when plans for dinner change and he has cooked and when the family doesn't come home when wife says they will. Divide attention between structured conversation with conversation starters and moderately complex math task.  Math finding and completing rule for addition or subtraction -  70%, conversation  80% accuracy.    Assessment / Recommendations / Plan   Plan Continue with current plan of care          SLP Education - 09/22/14 1020    Education provided Yes   Education Details strategies for repeated questions   Person(s) Educated Patient  Methods Explanation;Handout   Comprehension Verbalized understanding          SLP Short Term Goals - 09/22/14 1021    SLP SHORT TERM GOAL #1   Title Pt will utlize external memory aids for daily chores, phone calls, and events, documenting date and time for these with occassional minimal asssitance   Time 1   Period Weeks   Status Achieved   SLP SHORT TERM GOAL #2   Title Organize/plan mildly complex tasks/information with 80% accuracy and occassional minimal assistance   Time 1   Period Weeks   Status Achieved   SLP SHORT TERM GOAL #3   Title Alternate attention between 2 mildly complex cognitive linguistic tasks with mild  distractions 80% accuracy on each.   Time 1   Status Achieved          SLP Long Term Goals - 09/22/14 1021    SLP LONG TERM GOAL #1   Title Use external memory aids for daily chores, events, phone messages over 4 sessions with modified independence   Time 3   Period Weeks   Status On-going   SLP LONG TERM GOAL #2   Title Organize/plan moderately complex tasks/information with 90% accuracy and rare minimal assistance   Time 3   Period Weeks   Status Achieved   SLP LONG TERM GOAL #3   Title Divide attention between 2 simple cognitive linguistic tasks with 75% on each and occassional minimal assistance   Time 3   Status On-going          Plan - 09/22/14 1020    Clinical Impression Statement Pt continues with attention deficits hindering processing speed in linguistic tasks.    Speech Therapy Frequency 2x / week   Duration 4 weeks   Treatment/Interventions Functional tasks;Patient/family education;Cueing hierarchy;Environmental controls;Cognitive reorganization;SLP instruction and feedback;Internal/external aids;Compensatory techniques   Potential to Achieve Goals Fair   Potential Considerations Ability to learn/carryover information;Severity of impairments   Consulted and Agree with Plan of Care Patient        Problem List Patient Active Problem List   Diagnosis Date Noted  . Cognitive deficit as late effect of traumatic brain injury 07/24/2014  . 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 Hypothyroid 11/11/2013  . Testosterone deficiency 11/11/2013  . Subdural hematoma 10/22/2013  . H/O craniotomy 04/30/2013  . Injury of right rotator cuff 02/28/2013  . Scrotal irritation 02/28/2013  . Closed fracture of facial bones 01/03/2013  . TBI (traumatic brain injury) 01/03/2013  . Pneumonia 01/03/2013  . Acute blood loss anemia 12/30/2012  . Subdural hemorrhage following injury 12/15/2012  . Respiratory failure following trauma and surgery  12/15/2012  . Cardiac arrest 12/15/2012  . Blunt trauma of face 12/15/2012  . Blunt head trauma 12/15/2012  . Traumatic subarachnoid bleed with LOC of 30 minutes or less 12/15/2012  . Elevated ETOH level 12/15/2012    Lovvorn, Annye Rusk, SLP 09/22/2014, 10:23 AM  [facility] [address] [place], [place], [address] Phone: [phone]   Fax:  [phone]

## 2014-09-22 NOTE — Patient Instructions (Signed)
  Consider taping off area of counter to keep your personal area clear of clutter  Log calls/questions to Luana to keep track of what /when you have asked  Finish math sheet with TV or distractions

## 2014-09-23 ENCOUNTER — Encounter: Payer: 59 | Attending: Physical Medicine & Rehabilitation | Admitting: Physical Medicine & Rehabilitation

## 2014-09-23 ENCOUNTER — Encounter: Payer: Self-pay | Admitting: Physical Medicine & Rehabilitation

## 2014-09-23 VITALS — BP 122/72 | HR 76 | Resp 14

## 2014-09-23 DIAGNOSIS — R41841 Cognitive communication deficit: Secondary | ICD-10-CM | POA: Insufficient documentation

## 2014-09-23 DIAGNOSIS — S066X1S Traumatic subarachnoid hemorrhage with loss of consciousness of 30 minutes or less, sequela: Secondary | ICD-10-CM

## 2014-09-23 DIAGNOSIS — F068 Other specified mental disorders due to known physiological condition: Secondary | ICD-10-CM

## 2014-09-23 DIAGNOSIS — S069X0S Unspecified intracranial injury without loss of consciousness, sequela: Secondary | ICD-10-CM

## 2014-09-23 DIAGNOSIS — F909 Attention-deficit hyperactivity disorder, unspecified type: Secondary | ICD-10-CM

## 2014-09-23 DIAGNOSIS — F900 Attention-deficit hyperactivity disorder, predominantly inattentive type: Secondary | ICD-10-CM

## 2014-09-23 MED ORDER — METHYLPHENIDATE HCL ER (OSM) 36 MG PO TBCR: 36.0000 mg | EXTENDED_RELEASE_TABLET | ORAL | Status: DC

## 2014-09-23 MED ORDER — NAPROXEN 500 MG PO TABS: 500.0000 mg | ORAL_TABLET | Freq: Every day | ORAL | Status: DC | PRN

## 2014-09-23 MED ORDER — LEVETIRACETAM 750 MG PO TABS: 750.0000 mg | ORAL_TABLET | Freq: Two times a day (BID) | ORAL | Status: DC

## 2014-09-23 MED ORDER — TOPIRAMATE 25 MG PO TABS
25.0000 mg | ORAL_TABLET | Freq: Every day | ORAL | Status: DC
Start: 2014-09-23 — End: 2014-11-23

## 2014-09-23 NOTE — Progress Notes (Signed)
Subjective:    Patient ID: Albert [name], male    DOB: 01/30/1976, 39 y.o.   MRN: [id number]  HPI   Albert [name] is back regarding his traumatic brain injury. He developed increased headaches as we decreased  the keppra and increased the exelon. After decreasing the exelon and resuming higher dose keppra and treating a sinus infection, his headaches improved. I also rx'ed naproxen which he's taking in the AM. Currently, he's taking 750mg  BID of the keppra.   Headaches are located in various issues. It is worst at the end of the day when he's fatigued, tired or stressed.   Pain Inventory Average Pain 0 Pain Right Now 0 My pain is no pain  In the last 24 hours, has pain interfered with the following? General activity 0 Relation with others 0 Enjoyment of life 0 What TIME of day is your pain at its worst? no pain Sleep (in general) Fair  Pain is worse with: no pain Pain improves with: no pain Relief from Meds: no pain  Mobility walk without assistance ability to climb steps?  yes do you drive?  no  Function not employed: date last employed 12/14/12 I need assistance with the following:  meal prep  Neuro/Psych No problems in this area  Prior Studies Any changes since last visit?  no  Physicians involved in your care Any changes since last visit?  no   Family History  Problem Relation Age of Onset  . Heart disease Father    History   Social History  . Marital Status: Married    Spouse Name: N/A    Number of Children: N/A  . Years of Education: N/A   Social History Main Topics  . Smoking status: Former Smoker    Types: Cigarettes    Quit date: 12/17/1999  . Smokeless tobacco: Never Used  . Alcohol Use: 0.0 oz/week  . Drug Use: No  . Sexual Activity: None   Other Topics Concern  . None   Social History Narrative   Past Surgical History  Procedure Laterality Date  . Craniotomy Left 12/15/2012    Procedure: CRANIECTOMY HEMATOMA EVACUATION SUBDURAL WITH  PLACEMENT OF BONE FLAP IN ABDOMINAL WALL;  Surgeon: Floyce Stakes, MD;  Location: [facility];  Service: Neurosurgery;  Laterality: Left;  . Craniotomy Left 12/28/2012    Procedure: CRANIOTOMY HEMATOMA EVACUATION SUBDURAL;  Surgeon: Erline Levine, MD;  Location: [facility];  Service: Neurosurgery;  Laterality: Left;  . Peg placement N/A 12/31/2012    Procedure: PERCUTANEOUS ENDOSCOPIC GASTROSTOMY (PEG) PLACEMENT;  Surgeon: Zenovia Jarred, MD;  Location: [facility];  Service: General;  Laterality: N/A;  bedside  peg  . Percutaneous tracheostomy N/A 12/31/2012    Procedure: PERCUTANEOUS TRACHEOSTOMY (BEDSIDE);  Surgeon: Zenovia Jarred, MD;  Location: [facility];  Service: General;  Laterality: N/A;  . Wrist surgery Right 2008  . Cranioplasty N/A 06/10/2013    Procedure: CRANIOPLASTY;  Surgeon: Floyce Stakes, MD;  Location: [facility];  Service: Neurosurgery;  Laterality: N/A;  CRANIOPLASTY   Past Medical History  Diagnosis Date  . Headache(784.0)   . Traumatic brain injury   . 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 Rotator cuff injury     right  . Stroke     some aphasia , weakness in the R arm , Out pt. rehab currently   . Swallowing problem     still relearning controlled swallowing - rec'ing speech therapy in rehab  BP 122/72  P 76  o2 sat 96% Opioid Risk Score:   Fall Risk Score: Low Fall Risk (0-5 points) Review of Systems  All other systems reviewed and are negative.      Objective:   Physical Exam   General: Alert and oriented x 3, No apparent distress. Wearing helmet.  HEENT: Head is normocephalic, atraumatic, PERRLA, EOMI, sclera anicteric, oral mucosa pink and moist, dentition intact, ext ear canals clear,  Neck: Supple without JVD or lymphadenopathy  Heart: Reg rate and rhythm. No murmurs rubs or gallops  Chest: CTA bilaterally without wheezes, rales, or rhonchi; no distress  Abdomen: Soft, non-tender, non-distended, bowel sounds positive.  Extremities: No clubbing,  cyanosis, or edema. Pulses are 2+  Skin: Clean and intact without signs of breakdown  Neuro: Pt recalled the month, year. Initiates conversations. Asked pertinent questions. Attention better. Affect very Dynamic. Better insight and awareness. Non-agitated. Right HH, no central deficits appreciated. Doesn't swing the right arm as much with gait. Appears fairly steady on his feet. DTR's 3+ on right.  Musculoskeletal: Right shoulder with minimal tenderness  Psych: Pt's affect is more dynamic and pleasant. Pt is cooperative GU: scrotum not visualized    Assessment:  1. 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 Status post craniotomy evacuation of hematoma. He has made substantial progress but still has significant cognitive deficits which affect memory, attention, energy levels, focus, higher level cognition.  2. Old right shoulder injury, likely RTC tendonitis--improved  3. Seizure post- traumatic  4. Scrotal swelling, pain----?dermatitis  5. Post-traumatic headaches     Plan:  1. Continue low dose propranolol for headache and mood.  2. Continue exelon at 3mg  BID for memory.    3. Concerta for attention and memory. Second rx for next month..  4. Continue keppra 750mg  bid for now and add low dose topamax at night for headache prophylaxis with potential goal of weaning keppra. May benefit from neuro-psych assistance for coping skills. Discussed better coping skills as well and relaxation techniques.  5. Continue SLP will make a formal neuropsych testing afterward.  6. Consider a formal driving eval before I allow him to return to the wheel.  7. Follow up with me in 2 months. 30 minutes of face to face patient care time was spent during this visit. He remains pre-vocational. Discussed the possibility of neuropsych testing after I see him back in November

## 2014-09-23 NOTE — Patient Instructions (Signed)
TRY TOPAMAX 25MG  FOR ONE WEEK AT BEDTIME. IF NO IMPROVEMENT IN HEADACHES, AND NO SIDE EFFECTS INCREASE TO 50MG   CONTINUE KEPPRA AS IS  WORK ON STRESS MGT.  IT'S OK TO TAKE A LITTLE NAP DURING.

## 2014-09-24 ENCOUNTER — Ambulatory Visit: Payer: 59 | Admitting: Speech Pathology

## 2014-09-24 DIAGNOSIS — R41841 Cognitive communication deficit: Secondary | ICD-10-CM | POA: Diagnosis not present

## 2014-09-24 NOTE — Therapy (Signed)
[facility] [address] [place], [place], [address] Phone: [phone]   Fax:  [phone]  Speech Language Pathology Treatment  Patient Details  Name: Albert [name] MRN: [id number] Date of Birth: 1975-10-02 Referring Provider:  Meredith Staggers, MD  Encounter Date: 09/24/2014      End of Session - 09/24/14 1017    SLP Stop Time  1017   Activity Tolerance Patient tolerated treatment well      Past Medical History  Diagnosis Date  . Headache(784.0)   . Traumatic brain injury   . 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 Rotator cuff injury     right  . Stroke     some aphasia , weakness in the R arm , Out pt. rehab currently   . Swallowing problem     still relearning controlled swallowing - rec'ing speech therapy in rehab    Past Surgical History  Procedure Laterality Date  . Craniotomy Left 12/15/2012    Procedure: CRANIECTOMY HEMATOMA EVACUATION SUBDURAL WITH PLACEMENT OF BONE FLAP IN ABDOMINAL WALL;  Surgeon: Floyce Stakes, MD;  Location: [facility];  Service: Neurosurgery;  Laterality: Left;  . Craniotomy Left 12/28/2012    Procedure: CRANIOTOMY HEMATOMA EVACUATION SUBDURAL;  Surgeon: Erline Levine, MD;  Location: [facility];  Service: Neurosurgery;  Laterality: Left;  . Peg placement N/A 12/31/2012    Procedure: PERCUTANEOUS ENDOSCOPIC GASTROSTOMY (PEG) PLACEMENT;  Surgeon: Zenovia Jarred, MD;  Location: [facility];  Service: General;  Laterality: N/A;  bedside  peg  . Percutaneous tracheostomy N/A 12/31/2012    Procedure: PERCUTANEOUS TRACHEOSTOMY (BEDSIDE);  Surgeon: Zenovia Jarred, MD;  Location: [facility];  Service: General;  Laterality: N/A;  . Wrist surgery Right 2008  . Cranioplasty N/A 06/10/2013    Procedure: CRANIOPLASTY;  Surgeon: Floyce Stakes, MD;  Location: [facility];  Service: Neurosurgery;  Laterality: N/A;  CRANIOPLASTY    There were no vitals taken for this visit.  Visit Diagnosis:  Cognitive communication deficit      Subjective Assessment - 09/24/14 0937    Symptoms "I had an appointment yesterday - I can't remember if he changed any meds."   Currently in Pain? No/denies             ADULT SLP TREATMENT - 09/24/14 0937    General Information   Behavior/Cognition Alert;Cooperative;Pleasant mood   Treatment Provided   Treatment provided Cognitive-Linquistic   Cognitive-Linquistic Treatment   Treatment focused on Cognition   Skilled Treatment Pt completed homework,, I used phone to check on his MD appointment. However, he verbalized difficulty recalling what the doctor said. I instructed him to use his notebook to write down this type of info immediately after the appt to help him recall. Organize complex, multi step treasure huntword problem with  extended time and  mod assist. Organize dinner for [employer] day - menu plan, grocery list with exact amounts (ie: 2 lbs of beef, 1 head of lettuce) Cues required to include sides.  Extended time to generate meal ideas.  Min questioning cues for detailed grocery list (ie: put lettuce on list, not salad) Homework to complete list and estimate prices for weekly dinner budget.    Assessment / Recommendations / Plan   Plan Continue with current plan of care   Progression Toward Goals   Progression toward goals Progressing toward goals          SLP Education - 09/24/14 1012  Education provided Yes   Education Details use notebook to help recall MD appointment, grocery lists   Person(s) Educated Patient   Methods Explanation   Comprehension Verbalized understanding          SLP Short Term Goals - 09/22/14 1021    SLP SHORT TERM GOAL #1   Title Pt will utlize external memory aids for daily chores, phone calls, and events, documenting date and time for these with occassional minimal asssitance   Time 1   Period Weeks   Status Achieved   SLP SHORT TERM GOAL #2   Title Organize/plan mildly complex  tasks/information with 80% accuracy and occassional minimal assistance   Time 1   Period Weeks   Status Achieved   SLP SHORT TERM GOAL #3   Title Alternate attention between 2 mildly complex cognitive linguistic tasks with mild distractions 80% accuracy on each.   Time 1   Status Achieved          SLP Long Term Goals - 09/24/14 1101    SLP LONG TERM GOAL #1   Title Use external memory aids for daily chores, events, phone messages over 4 sessions with modified independence   Time 2   Period Weeks   Status On-going   SLP LONG TERM GOAL #2   Title Organize/plan mildly complex tasks/information with 90% accuracy and rare minimal assistance   Time 2   Period Weeks   SLP LONG TERM GOAL #3   Title Divide attention between 2 simple cognitive linguistic tasks with 75% on each and occassional minimal assistance   Time 2   Period Weeks   Status On-going          Plan - 09/24/14 1100    Speech Therapy Frequency 2x / week   Treatment/Interventions Functional tasks;Patient/family education;Cueing hierarchy;Environmental controls;Cognitive reorganization;SLP instruction and feedback;Internal/external aids;Compensatory techniques   Potential to Achieve Goals Fair   Potential Considerations Ability to learn/carryover information;Severity of impairments   Consulted and Agree with Plan of Care Patient        Problem List Patient Active Problem List   Diagnosis Date Noted  . Cognitive deficit as late effect of traumatic brain injury 07/24/2014  . 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 Hypothyroid 11/11/2013  . Testosterone deficiency 11/11/2013  . Subdural hematoma 10/22/2013  . H/O craniotomy 04/30/2013  . Injury of right rotator cuff 02/28/2013  . Scrotal irritation 02/28/2013  . Closed fracture of facial bones 01/03/2013  . TBI (traumatic brain injury) 01/03/2013  . Pneumonia 01/03/2013  . Acute blood loss anemia 12/30/2012  . Subdural  hemorrhage following injury 12/15/2012  . Respiratory failure following trauma and surgery 12/15/2012  . Cardiac arrest 12/15/2012  . Blunt trauma of face 12/15/2012  . Blunt head trauma 12/15/2012  . Traumatic subarachnoid bleed with LOC of 30 minutes or less 12/15/2012  . Elevated ETOH level 12/15/2012    Lovvorn, Annye Rusk, SLP 09/24/2014, 11:03 AM  [facility] [address] [place], [place], [address] Phone: [phone]   Fax:  [phone]

## 2014-09-24 NOTE — Patient Instructions (Addendum)
   Use pocket notebook to write down info from doctor's appointment immediately after so you can review it and recall any changes in meds or therapy. Can do this on the way home.   Use only 1 notebook at a time.

## 2014-09-29 ENCOUNTER — Ambulatory Visit: Payer: 59 | Admitting: Speech Pathology

## 2014-09-29 DIAGNOSIS — R41841 Cognitive communication deficit: Secondary | ICD-10-CM | POA: Diagnosis not present

## 2014-09-29 NOTE — Patient Instructions (Signed)
Pt to find names of 4 parks at [employer], select 3 of these that his children would enjoy best and select 3 restaurants in the [place] that his kids would like the menu for executive function tasks

## 2014-09-29 NOTE — Therapy (Signed)
[facility] [address] [place], [place], [address] Phone: [phone]   Fax:  [phone]  Speech Language Pathology Treatment  Patient Details  Name: Albert [name] MRN: [id number] Date of Birth: February 21, 1976 Referring Provider:  Meredith Staggers, MD  Encounter Date: 09/29/2014      End of Session - 09/29/14 1023    Visit Number 11   Number of Visits 16   Date for SLP Re-Evaluation 10/08/14   SLP Start Time 0933   SLP Stop Time  7341   SLP Time Calculation (min) 42 min      Past Medical History  Diagnosis Date  . Headache(784.0)   . Traumatic brain injury   . 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 Rotator cuff injury     right  . Stroke     some aphasia , weakness in the R arm , Out pt. rehab currently   . Swallowing problem     still relearning controlled swallowing - rec'ing speech therapy in rehab    Past Surgical History  Procedure Laterality Date  . Craniotomy Left 12/15/2012    Procedure: CRANIECTOMY HEMATOMA EVACUATION SUBDURAL WITH PLACEMENT OF BONE FLAP IN ABDOMINAL WALL;  Surgeon: Floyce Stakes, MD;  Location: [facility];  Service: Neurosurgery;  Laterality: Left;  . Craniotomy Left 12/28/2012    Procedure: CRANIOTOMY HEMATOMA EVACUATION SUBDURAL;  Surgeon: Erline Levine, MD;  Location: [facility];  Service: Neurosurgery;  Laterality: Left;  . Peg placement N/A 12/31/2012    Procedure: PERCUTANEOUS ENDOSCOPIC GASTROSTOMY (PEG) PLACEMENT;  Surgeon: Zenovia Jarred, MD;  Location: [facility];  Service: General;  Laterality: N/A;  bedside  peg  . Percutaneous tracheostomy N/A 12/31/2012    Procedure: PERCUTANEOUS TRACHEOSTOMY (BEDSIDE);  Surgeon: Zenovia Jarred, MD;  Location: [facility];  Service: General;  Laterality: N/A;  . Wrist surgery Right 2008  . Cranioplasty N/A 06/10/2013    Procedure: CRANIOPLASTY;  Surgeon: Floyce Stakes, MD;  Location: [facility];  Service: Neurosurgery;  Laterality: N/A;   CRANIOPLASTY    There were no vitals taken for this visit.  Visit Diagnosis: Cognitive communication deficit      Subjective Assessment - 09/29/14 0936    Symptoms "I can't remember about why I didn't get the homework"   Currently in Pain? No/denies             ADULT SLP TREATMENT - 09/29/14 0937    General Information   Behavior/Cognition Alert;Cooperative;Pleasant mood   Treatment Provided   Treatment provided Cognitive-Linquistic   Cognitive-Linquistic Treatment   Skilled Treatment Executive function planning family trip to [place]. Compare prices at hotels, ticket prices, [employer] using his smart phone.   Occassional mod A to get highest and lowest prices and to navigate ticket packages. Usual mod A to locate a [employer] attraction that his daughter would enjoy. Pt attended to this task with noise from gym with supervision assitance.     Assessment / Recommendations / Plan   Plan Continue with current plan of care   Progression Toward Goals   Progression toward goals Progressing toward goals          SLP Education - 09/29/14 1023    Education provided Yes   Education Details goals, executive functioning skills   Person(s) Educated Patient   Methods Explanation   Comprehension Verbalized understanding          SLP Short Term Goals - 09/22/14 1021  SLP SHORT TERM GOAL #1   Title Pt will utlize external memory aids for daily chores, phone calls, and events, documenting date and time for these with occassional minimal asssitance   Time 1   Period Weeks   Status Achieved   SLP SHORT TERM GOAL #2   Title Organize/plan mildly complex tasks/information with 80% accuracy and occassional minimal assistance   Time 1   Period Weeks   Status Achieved   SLP SHORT TERM GOAL #3   Title Alternate attention between 2 mildly complex cognitive linguistic tasks with mild distractions 80% accuracy on each.   Time 1   Status Achieved          SLP Long Term Goals -  09/29/14 1027    SLP LONG TERM GOAL #1   Title Use external memory aids for daily chores, events, phone messages over 4 sessions with modified independence   Time 1   Period Weeks   Status On-going   SLP LONG TERM GOAL #2   Title Organize/plan moderately complex tasks/information with 90% accuracy and rare minimal assistance   Time 1   Status Revised   SLP LONG TERM GOAL #3   Title Divide attention between 2 simple cognitive linguistic tasks with 75% on each and occassional minimal assistance   Time 1   Status On-going          Plan - 09/29/14 1025    Clinical Impression Statement Pt required occassional to usual moderate assistance for complex organization (executive function) tasks. Recommend continue skilled ST to maxmize functional cognitive linguistic skills and reduce caregiver burden   Speech Therapy Frequency 2x / week   Treatment/Interventions Functional tasks;Patient/family education;Cueing hierarchy;Environmental controls;Cognitive reorganization;SLP instruction and feedback;Internal/external aids;Compensatory techniques   Potential to Achieve Goals Fair   Potential Considerations Ability to learn/carryover information;Severity of impairments   Consulted and Agree with Plan of Care Patient        Problem List Patient Active Problem List   Diagnosis Date Noted  . Cognitive deficit as late effect of traumatic brain injury 07/24/2014  . 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 Hypothyroid 11/11/2013  . Testosterone deficiency 11/11/2013  . Subdural hematoma 10/22/2013  . H/O craniotomy 04/30/2013  . Injury of right rotator cuff 02/28/2013  . Scrotal irritation 02/28/2013  . Closed fracture of facial bones 01/03/2013  . TBI (traumatic brain injury) 01/03/2013  . Pneumonia 01/03/2013  . Acute blood loss anemia 12/30/2012  . Subdural hemorrhage following injury 12/15/2012  . Respiratory failure following trauma and surgery 12/15/2012  .  Cardiac arrest 12/15/2012  . Blunt trauma of face 12/15/2012  . Blunt head trauma 12/15/2012  . Traumatic subarachnoid bleed with LOC of 30 minutes or less 12/15/2012  . Elevated ETOH level 12/15/2012    [name], Annye Rusk, SLP 09/29/2014, 10:30 AM  [facility] [address] [place], [place], [address] Phone: [phone]   Fax:  [phone]

## 2014-10-01 ENCOUNTER — Ambulatory Visit: Payer: 59

## 2014-10-06 ENCOUNTER — Ambulatory Visit: Payer: 59 | Admitting: Speech Pathology

## 2014-10-09 ENCOUNTER — Ambulatory Visit: Payer: 59

## 2014-10-09 DIAGNOSIS — R41841 Cognitive communication deficit: Secondary | ICD-10-CM | POA: Diagnosis not present

## 2014-10-09 NOTE — Therapy (Signed)
[facility] [address] [place], [place], [address] Phone: [phone]   Fax:  [phone]  Speech Language Pathology Treatment  Patient Details  Name: Albert [name] MRN: [id number] Date of Birth: 1976-03-15 Referring Provider:  Meredith Staggers, MD  Encounter Date: 10/09/2014      End of Session - 10/09/14 1023    Visit Number 12   Number of Visits 16   Date for SLP Re-Evaluation 10/08/14   SLP Start Time 0932   SLP Stop Time  9678   SLP Time Calculation (min) 43 min   Activity Tolerance Patient tolerated treatment well      Past Medical History  Diagnosis Date  . Headache(784.0)   . Traumatic brain injury   . 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 Rotator cuff injury     right  . Stroke     some aphasia , weakness in the R arm , Out pt. rehab currently   . Swallowing problem     still relearning controlled swallowing - rec'ing speech therapy in rehab    Past Surgical History  Procedure Laterality Date  . Craniotomy Left 12/15/2012    Procedure: CRANIECTOMY HEMATOMA EVACUATION SUBDURAL WITH PLACEMENT OF BONE FLAP IN ABDOMINAL WALL;  Surgeon: Floyce Stakes, MD;  Location: [facility];  Service: Neurosurgery;  Laterality: Left;  . Craniotomy Left 12/28/2012    Procedure: CRANIOTOMY HEMATOMA EVACUATION SUBDURAL;  Surgeon: Erline Levine, MD;  Location: [facility];  Service: Neurosurgery;  Laterality: Left;  . Peg placement N/A 12/31/2012    Procedure: PERCUTANEOUS ENDOSCOPIC GASTROSTOMY (PEG) PLACEMENT;  Surgeon: Zenovia Jarred, MD;  Location: [facility];  Service: General;  Laterality: N/A;  bedside  peg  . Percutaneous tracheostomy N/A 12/31/2012    Procedure: PERCUTANEOUS TRACHEOSTOMY (BEDSIDE);  Surgeon: Zenovia Jarred, MD;  Location: [facility];  Service: General;  Laterality: N/A;  . Wrist surgery Right 2008  . Cranioplasty N/A 06/10/2013    Procedure: CRANIOPLASTY;  Surgeon: Floyce Stakes, MD;  Location: [facility];  Service: Neurosurgery;  Laterality: N/A;  CRANIOPLASTY    There were no vitals taken for this visit.  Visit Diagnosis: Cognitive communication deficit      Subjective Assessment - 10/09/14 0940    Symptoms Pt took notes re: next week being the last week in [place], and talking to Dr. 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             ADULT SLP TREATMENT - 10/09/14 0941    General Information   Behavior/Cognition Alert;Cooperative;Pleasant mood   Treatment Provided   Treatment provided Cognitive-Linquistic   Pain Assessment   Pain Assessment No/denies pain   Cognitive-Linquistic Treatment   Treatment focused on Cognition   Skilled Treatment SLP assisted pt in helping him reason through what planner would work best for him. Pt decided that a planner size between legal pad and 4x6" size would be best. SLP helped pt brainstorm and organize other therapy and volunteer opportunities. Pt took notes with occasional min A from SLP for accuracy.  Pt completed 85% his homework re: [employer]. Pt req'd min A to make details salient enough for him to recall later on (Necessary to add details).   Assessment / Recommendations / Plan   Plan Continue with current plan of care   Progression Toward Goals   Progression toward goals Progressing toward goals            SLP  Short Term Goals - 10/09/14 1647    SLP SHORT TERM GOAL #1   Title Pt will utlize external memory aids for daily chores, phone calls, and events, documenting date and time for these with occassional minimal asssitance   Time 1   Period Weeks   Status Achieved   SLP SHORT TERM GOAL #2   Title Organize/plan mildly complex tasks/information with 80% accuracy and occassional minimal assistance   Time 1   Period Weeks   Status Achieved   SLP SHORT TERM GOAL #3   Title Alternate attention between 2 mildly complex cognitive linguistic tasks with mild distractions 80% accuracy on each.   Time 1   Status  Achieved          SLP Long Term Goals - 10/09/14 1647    SLP LONG TERM GOAL #1   Title Use external memory aids for daily chores, events, phone messages over 4 sessions with modified independence   Time 1   Period Weeks   Status On-going   SLP LONG TERM GOAL #2   Title Organize/plan moderately complex tasks/information with 90% accuracy and rare minimal assistance   Time 1   Status Revised   SLP LONG TERM GOAL #3   Title Divide attention between 2 simple cognitive linguistic tasks with 75% on each and occassional minimal assistance   Time 1   Status On-going          Plan - 10/09/14 1023    Clinical Impression Statement Pt  relates difficulty with memory - SLP discussed with pt what means would be best for him to recall details.   Speech Therapy Frequency 2x / week   Duration 1 week   Treatment/Interventions Functional tasks;Patient/family education;Cueing hierarchy;Environmental controls;Cognitive reorganization;SLP instruction and feedback;Internal/external aids;Compensatory techniques   Potential to Achieve Goals Fair   Potential Considerations Ability to learn/carryover information;Severity of impairments        Problem List Patient Active Problem List   Diagnosis Date Noted  . Cognitive deficit as late effect of traumatic brain injury 07/24/2014  . 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 Hypothyroid 11/11/2013  . Testosterone deficiency 11/11/2013  . Subdural hematoma 10/22/2013  . H/O craniotomy 04/30/2013  . Injury of right rotator cuff 02/28/2013  . Scrotal irritation 02/28/2013  . Closed fracture of facial bones 01/03/2013  . TBI (traumatic brain injury) 01/03/2013  . Pneumonia 01/03/2013  . Acute blood loss anemia 12/30/2012  . Subdural hemorrhage following injury 12/15/2012  . Respiratory failure following trauma and surgery 12/15/2012  . Cardiac arrest 12/15/2012  . Blunt trauma of face 12/15/2012  . Blunt head trauma  12/15/2012  . Traumatic subarachnoid bleed with LOC of 30 minutes or less 12/15/2012  . Elevated ETOH level 12/15/2012    [facility], SLP 10/09/2014, 4:48 PM  [facility] [address] [place], [place], [address] Phone: [phone]   Fax:  [phone]

## 2014-10-09 NOTE — Patient Instructions (Addendum)
  Please complete the assigned speech therapy homework and return it to your next session.  

## 2014-10-13 ENCOUNTER — Ambulatory Visit: Payer: 59 | Attending: Physical Medicine & Rehabilitation

## 2014-10-13 DIAGNOSIS — F4323 Adjustment disorder with mixed anxiety and depressed mood: Secondary | ICD-10-CM

## 2014-10-13 DIAGNOSIS — S069X9S Unspecified intracranial injury with loss of consciousness of unspecified duration, sequela: Secondary | ICD-10-CM

## 2014-10-13 DIAGNOSIS — F068 Other specified mental disorders due to known physiological condition: Secondary | ICD-10-CM

## 2014-10-13 DIAGNOSIS — R41841 Cognitive communication deficit: Secondary | ICD-10-CM | POA: Diagnosis present

## 2014-10-13 NOTE — Therapy (Signed)
[facility] [address] [place], [place], [address] Phone: [phone]   Fax:  [phone]  Speech Language Pathology Treatment  Patient Details  Name: Albert [name] MRN: [id number] Date of Birth: 08/31/1976 Referring Provider:  Meredith Staggers, MD  Encounter Date: 10/13/2014      End of Session - 10/13/14 1705    Visit Number 14   Number of Visits 16   Date for SLP Re-Evaluation 10/16/14   SLP Start Time 0932   SLP Stop Time  1016   SLP Time Calculation (min) 44 min   Activity Tolerance Patient tolerated treatment well      Past Medical History  Diagnosis Date  . Headache(784.0)   . Traumatic brain injury   . 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 Rotator cuff injury     right  . Stroke     some aphasia , weakness in the R arm , Out pt. rehab currently   . Swallowing problem     still relearning controlled swallowing - rec'ing speech therapy in rehab    Past Surgical History  Procedure Laterality Date  . Craniotomy Left 12/15/2012    Procedure: CRANIECTOMY HEMATOMA EVACUATION SUBDURAL WITH PLACEMENT OF BONE FLAP IN ABDOMINAL WALL;  Surgeon: Floyce Stakes, MD;  Location: [facility];  Service: Neurosurgery;  Laterality: Left;  . Craniotomy Left 12/28/2012    Procedure: CRANIOTOMY HEMATOMA EVACUATION SUBDURAL;  Surgeon: Erline Levine, MD;  Location: [facility];  Service: Neurosurgery;  Laterality: Left;  . Peg placement N/A 12/31/2012    Procedure: PERCUTANEOUS ENDOSCOPIC GASTROSTOMY (PEG) PLACEMENT;  Surgeon: Zenovia Jarred, MD;  Location: [facility];  Service: General;  Laterality: N/A;  bedside  peg  . Percutaneous tracheostomy N/A 12/31/2012    Procedure: PERCUTANEOUS TRACHEOSTOMY (BEDSIDE);  Surgeon: Zenovia Jarred, MD;  Location: [facility];  Service: General;  Laterality: N/A;  . Wrist surgery Right 2008  . Cranioplasty N/A 06/10/2013    Procedure: CRANIOPLASTY;  Surgeon: Floyce Stakes, MD;  Location: [facility];  Service: Neurosurgery;  Laterality: N/A;  CRANIOPLASTY    There were no vitals taken for this visit.  Visit Diagnosis: Cognitive communication deficit      Subjective Assessment - 10/13/14 0938    Symptoms Pt req'd SLP cues to recall homework.             ADULT SLP TREATMENT - 10/13/14 1004    General Information   Behavior/Cognition Alert;Cooperative;Pleasant mood   Treatment Provided   Treatment provided Cognitive-Linquistic   Pain Assessment   Pain Assessment No/denies pain   Cognitive-Linquistic Treatment   Treatment focused on Cognition   Skilled Treatment Pt demo'd knowledge of where he wrote topics to talk to wife about after last session. Pt did not do so. He req'd cues about asking Dr. Valentina Shaggy about vocational rehab, and volunteering. Pt expressed frustration re: home situations with wife. SLP encouraged pt to talk with Dr. Valentina Shaggy about this. Pt required min-mod A  usually for executive function task ([employer] meal plans vs. piecemeal meals around the parks).    Assessment / Recommendations / Plan   Plan Continue with current plan of care   Progression Toward Goals   Progression toward goals Progressing toward goals            SLP Short Term Goals - 10/09/14 1647    SLP SHORT TERM GOAL #1   Title Pt will utlize external memory aids  for daily chores, phone calls, and events, documenting date and time for these with occassional minimal asssitance   Time 1   Period Weeks   Status Achieved   SLP SHORT TERM GOAL #2   Title Organize/plan mildly complex tasks/information with 80% accuracy and occassional minimal assistance   Time 1   Period Weeks   Status Achieved   SLP SHORT TERM GOAL #3   Title Alternate attention between 2 mildly complex cognitive linguistic tasks with mild distractions 80% accuracy on each.   Time 1   Status Achieved          SLP Long Term Goals - 10/13/14 1705    SLP LONG TERM GOAL #1   Title Use external memory aids  for daily chores, events, phone messages over 4 sessions with modified independence   Time 1   Period Weeks   Status On-going   SLP LONG TERM GOAL #2   Title Organize/plan moderately complex tasks/information with 90% accuracy and rare minimal assistance   Time 1   Status Revised   SLP LONG TERM GOAL #3   Title Divide attention between 2 simple cognitive linguistic tasks with 75% on each and occassional minimal assistance   Time 1   Status On-going          Plan - 10/13/14 1705    Clinical Impression Statement Pt cont with decr'd executive function skills, with perseveration today on family relationship difficulties with pt/wife. Pt req'd cues to think of other options of his original plan (meal plans).   Speech Therapy Frequency 2x / week   Duration 1 week   Treatment/Interventions Functional tasks;Patient/family education;Cueing hierarchy;Environmental controls;Cognitive reorganization;SLP instruction and feedback;Internal/external aids;Compensatory techniques   Potential to Achieve Goals Fair   Potential Considerations Severity of impairments;Ability to learn/carryover information        Problem List Patient Active Problem List   Diagnosis Date Noted  . Cognitive deficit as late effect of traumatic brain injury 07/24/2014  . 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 Hypothyroid 11/11/2013  . Testosterone deficiency 11/11/2013  . Subdural hematoma 10/22/2013  . H/O craniotomy 04/30/2013  . Injury of right rotator cuff 02/28/2013  . Scrotal irritation 02/28/2013  . Closed fracture of facial bones 01/03/2013  . TBI (traumatic brain injury) 01/03/2013  . Pneumonia 01/03/2013  . Acute blood loss anemia 12/30/2012  . Subdural hemorrhage following injury 12/15/2012  . Respiratory failure following trauma and surgery 12/15/2012  . Cardiac arrest 12/15/2012  . Blunt trauma of face 12/15/2012  . Blunt head trauma 12/15/2012  . Traumatic  subarachnoid bleed with LOC of 30 minutes or less 12/15/2012  . Elevated ETOH level 12/15/2012    [facility], SLP 10/13/2014, 5:07 PM  [facility] [address] [place], [place], [address] Phone: [phone]   Fax:  [phone]

## 2014-10-13 NOTE — Patient Instructions (Signed)
Ask Dr. Valentina Shaggy about the items we talked about today - voc rehab, volunteering, and family things

## 2014-10-15 ENCOUNTER — Ambulatory Visit: Payer: 59

## 2014-10-15 DIAGNOSIS — R41841 Cognitive communication deficit: Secondary | ICD-10-CM

## 2014-10-15 NOTE — Therapy (Addendum)
[facility] [address] [place], [place], [address] Phone: [phone]   Fax:  [phone]  Speech Language Pathology Treatment  Patient Details  Name: Albert [name] MRN: [id number] Date of Birth: 1975-09-21 Referring Provider:  Meredith Staggers, MD  Encounter Date: 10/15/2014      End of Session - 10/15/14 1017    Visit Number 15   Number of Visits 16   Date for SLP Re-Evaluation 10/16/14   SLP Start Time 0934   SLP Stop Time  1011   SLP Time Calculation (min) 37 min   Activity Tolerance Patient tolerated treatment well      Past Medical History  Diagnosis Date  . Headache(784.0)   . Traumatic brain injury   . 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 Rotator cuff injury     right  . Stroke     some aphasia , weakness in the R arm , Out pt. rehab currently   . Swallowing problem     still relearning controlled swallowing - rec'ing speech therapy in rehab    Past Surgical History  Procedure Laterality Date  . Craniotomy Left 12/15/2012    Procedure: CRANIECTOMY HEMATOMA EVACUATION SUBDURAL WITH PLACEMENT OF BONE FLAP IN ABDOMINAL WALL;  Surgeon: Floyce Stakes, MD;  Location: [facility];  Service: Neurosurgery;  Laterality: Left;  . Craniotomy Left 12/28/2012    Procedure: CRANIOTOMY HEMATOMA EVACUATION SUBDURAL;  Surgeon: Erline Levine, MD;  Location: [facility];  Service: Neurosurgery;  Laterality: Left;  . Peg placement N/A 12/31/2012    Procedure: PERCUTANEOUS ENDOSCOPIC GASTROSTOMY (PEG) PLACEMENT;  Surgeon: Zenovia Jarred, MD;  Location: [facility];  Service: General;  Laterality: N/A;  bedside  peg  . Percutaneous tracheostomy N/A 12/31/2012    Procedure: PERCUTANEOUS TRACHEOSTOMY (BEDSIDE);  Surgeon: Zenovia Jarred, MD;  Location: [facility];  Service: General;  Laterality: N/A;  . Wrist surgery Right 2008  . Cranioplasty N/A 06/10/2013    Procedure: CRANIOPLASTY;  Surgeon: Floyce Stakes, MD;  Location: [facility];  Service: Neurosurgery;  Laterality: N/A;  CRANIOPLASTY    There were no vitals taken for this visit.  Visit Diagnosis: Cognitive communication deficit      Subjective Assessment - 10/15/14 0942    Symptoms Pt again req'd SLP cues to recall homework.             ADULT SLP TREATMENT - 10/15/14 0944    General Information   Behavior/Cognition Cooperative;Pleasant mood   Treatment Provided   Treatment provided Cognitive-Linquistic   Pain Assessment   Pain Assessment 0-10   Pain Score 4    Pain Location head   Pain Descriptors / Indicators Headache   Pain Intervention(s) Monitored during session   Cognitive-Linquistic Treatment   Treatment focused on Cognition   Skilled Treatment Pt recalls talking to wife re: about other therapy options but does not recall the specifics of the conversation. SLP encouraged pt to continue to write details down - pt was not in a place where he could write down details from their conversation. The same thing happened with pt's conversation with Dr. Valentina Shaggy about details pertaining to their session. Pt again encouraged to write down more details and ask for repeats if he needs. Pt knows he needs to write things down but either forgets to do so, or does not do so because he does not want to make people upset with him. SLP encouraged/reinforced that pt must write  these things down or he is putting himself at a disadvantage. Pt agreed.   Assessment / Recommendations / Plan   Plan --  d/c due to max rehab potential met at this time            SLP Short Term Goals - 10/15/14 1019    SLP SHORT TERM GOAL #1   Title Pt will utlize external memory aids for daily chores, phone calls, and events, documenting date and time for these with occassional minimal asssitance   Time 1   Period Weeks   Status Achieved   SLP SHORT TERM GOAL #2   Title Organize/plan mildly complex tasks/information with 80% accuracy and occassional minimal assistance    Time 1   Period Weeks   Status Achieved   SLP SHORT TERM GOAL #3   Title Alternate attention between 2 mildly complex cognitive linguistic tasks with mild distractions 80% accuracy on each.   Time 1   Status Achieved          SLP Long Term Goals - 10/15/14 1020    SLP LONG TERM GOAL #1   Title Use external memory aids for daily chores, events, phone messages over 4 sessions with modified independence   Time 1   Period Weeks   Status Not Met   SLP LONG TERM GOAL #2   Title Organize/plan moderately complex tasks/information with 90% accuracy and rare minimal assistance   Time 1   Status Not Met   SLP LONG TERM GOAL #3   Title Divide attention between 2 simple cognitive linguistic tasks with 75% on each and occassional minimal assistance   Time 1   Status Not Met          Plan - 10/15/14 1018    Clinical Impression Statement Pt's skills are currently not advancing at a rate to continue therapy.   Treatment/Interventions Functional tasks;Patient/family education;Cueing hierarchy;Environmental controls;Cognitive reorganization;SLP instruction and feedback;Internal/external aids;Compensatory techniques     SPEECH THERAPY DISCHARGE SUMMARY  Visits from Start of Care: 15  Current functional level related to goals / functional outcomes: Pt met all STGs but no LTGs. He is able to complete executive function tasks with consistent assistance. His memory disorder persists and is complicated by the fact that he does not always use compensations due to being afraid he is "being a pain" by asking someone to repeat or to pause in their explanation so as to write details down. SLP educated pt re: he will NEED to write things down in order to recall any details of situations/conversations.   Remaining deficits: Memory, executive function   Education / Equipment: Compensations for memory.  Plan: Patient agrees to discharge.  Patient goals were not met. Patient is being discharged due to  lack of progress.  ?????  Pt was encouraged to cont neuropsych sessions every two weeks.      Problem List Patient Active Problem List   Diagnosis Date Noted  . Cognitive deficit as late effect of traumatic brain injury 07/24/2014  . 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 Hypothyroid 11/11/2013  . Testosterone deficiency 11/11/2013  . Subdural hematoma 10/22/2013  . H/O craniotomy 04/30/2013  . Injury of right rotator cuff 02/28/2013  . Scrotal irritation 02/28/2013  . Closed fracture of facial bones 01/03/2013  . TBI (traumatic brain injury) 01/03/2013  . Pneumonia 01/03/2013  . Acute blood loss anemia 12/30/2012  . Subdural hemorrhage following injury 12/15/2012  . Respiratory failure following trauma and  surgery 12/15/2012  . Cardiac arrest 12/15/2012  . Blunt trauma of face 12/15/2012  . Blunt head trauma 12/15/2012  . Traumatic subarachnoid bleed with LOC of 30 minutes or less 12/15/2012  . Elevated ETOH level 12/15/2012    SCHINKE,CARL , SLP  10/15/2014, 10:20 AM  [facility] [address] [place], [place], [address] Phone: [phone]   Fax:  [phone]

## 2014-10-27 ENCOUNTER — Other Ambulatory Visit: Payer: Self-pay | Admitting: Physical Medicine & Rehabilitation

## 2014-10-28 ENCOUNTER — Ambulatory Visit (INDEPENDENT_AMBULATORY_CARE_PROVIDER_SITE_OTHER): Payer: 59 | Admitting: Psychology

## 2014-10-28 ENCOUNTER — Encounter: Payer: Self-pay | Admitting: Psychology

## 2014-10-28 DIAGNOSIS — F4323 Adjustment disorder with mixed anxiety and depressed mood: Secondary | ICD-10-CM

## 2014-10-28 DIAGNOSIS — S069X9S Unspecified intracranial injury with loss of consciousness of unspecified duration, sequela: Secondary | ICD-10-CM

## 2014-10-28 DIAGNOSIS — F09 Unspecified mental disorder due to known physiological condition: Secondary | ICD-10-CM

## 2014-10-28 DIAGNOSIS — F068 Other specified mental disorders due to known physiological condition: Secondary | ICD-10-CM

## 2014-10-28 DIAGNOSIS — S069XAS Unspecified intracranial injury with loss of consciousness status unknown, sequela: Secondary | ICD-10-CM

## 2014-10-28 DIAGNOSIS — F4329 Adjustment disorder with other symptoms: Secondary | ICD-10-CM
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
## 2014-11-12 ENCOUNTER — Ambulatory Visit: Payer: 59 | Attending: Psychology | Admitting: Psychology

## 2014-11-12 DIAGNOSIS — F4323 Adjustment disorder with mixed anxiety and depressed mood: Secondary | ICD-10-CM

## 2014-11-12 DIAGNOSIS — S069X9S Unspecified intracranial injury with loss of consciousness of unspecified duration, sequela: Secondary | ICD-10-CM

## 2014-11-12 DIAGNOSIS — S069XAS Unspecified intracranial injury with loss of consciousness status unknown, sequela: Secondary | ICD-10-CM

## 2014-11-12 DIAGNOSIS — R41841 Cognitive communication deficit: Secondary | ICD-10-CM | POA: Insufficient documentation

## 2014-11-12 DIAGNOSIS — F4329 Adjustment disorder with other symptoms: Secondary | ICD-10-CM

## 2014-11-12 NOTE — Progress Notes (Signed)
Progress Note   Name: Albert [name] MRN: [id number]  Date:11/12/2014 (51m) psychotherapy No new concerns or issues expressed. 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 He will be volunteering a maximum of 2 -3 hours/day given his propensity to fatigue. His wife, who was invited in towards the end of the session, reported that he has resumed exercising with a Physiological scientist. She did not express any specific concerns.   Praised him for moving ahead with the volunteer position, which represents a step forward in his rehabilitation. Reminded him to take notes during his orientation.  Suggested that at this point we meet less frequently (i.e., every 3 or 4 weeks) though he expressed his desire to keep the schedule at every two weeks.   Return in two weeks.   Jamey Ripa, Ph.D Licensed Psychologist

## 2014-11-13 ENCOUNTER — Other Ambulatory Visit: Payer: Self-pay | Admitting: Physical Medicine & Rehabilitation

## 2014-11-23 ENCOUNTER — Encounter: Payer: Self-pay | Admitting: Physical Medicine & Rehabilitation

## 2014-11-23 ENCOUNTER — Encounter: Payer: 59 | Attending: Physical Medicine & Rehabilitation | Admitting: Physical Medicine & Rehabilitation

## 2014-11-23 VITALS — BP 123/84 | HR 71 | Resp 14

## 2014-11-23 DIAGNOSIS — F068 Other specified mental disorders due to known physiological condition: Secondary | ICD-10-CM

## 2014-11-23 DIAGNOSIS — R41841 Cognitive communication deficit: Secondary | ICD-10-CM | POA: Diagnosis present

## 2014-11-23 DIAGNOSIS — F39 Unspecified mood [affective] disorder: Secondary | ICD-10-CM

## 2014-11-23 DIAGNOSIS — F909 Attention-deficit hyperactivity disorder, unspecified type: Secondary | ICD-10-CM

## 2014-11-23 DIAGNOSIS — F0630 Mood disorder due to known physiological condition, unspecified: Secondary | ICD-10-CM

## 2014-11-23 DIAGNOSIS — F900 Attention-deficit hyperactivity disorder, predominantly inattentive type: Secondary | ICD-10-CM

## 2014-11-23 DIAGNOSIS — S069X9S Unspecified intracranial injury with loss of consciousness of unspecified duration, sequela: Secondary | ICD-10-CM

## 2014-11-23 DIAGNOSIS — S069X0S Unspecified intracranial injury without loss of consciousness, sequela: Secondary | ICD-10-CM

## 2014-11-23 DIAGNOSIS — S069XAS Unspecified intracranial injury with loss of consciousness status unknown, sequela: Secondary | ICD-10-CM

## 2014-11-23 MED ORDER — METHYLPHENIDATE HCL ER (OSM) 36 MG PO TBCR: 36.0000 mg | EXTENDED_RELEASE_TABLET | ORAL | Status: DC

## 2014-11-23 MED ORDER — RIVASTIGMINE TARTRATE 3 MG PO CAPS: 3.0000 mg | ORAL_CAPSULE | Freq: Two times a day (BID) | ORAL | Status: DC

## 2014-11-23 NOTE — Progress Notes (Signed)
Subjective:    Patient ID: Albert [name], male    DOB: 15-Aug-1976, 39 y.o.   MRN: [id number]  HPI   Albert [name] is here in follow up of his TBI. 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 He is also going to the [facility] and has been working on his balance, strength, cognitive/processing.   He tried topamax which wasn't tolerated. However headaches stopped after coming off the topamax.  He was told that he would not be eligible to return to driving given his HH.   Mood has been good. Outbursts are minimal.       Pain Inventory Average Pain 0 Pain Right Now 0 My pain is no pain  In the last 24 hours, has pain interfered with the following? General activity 0 Relation with others 0 Enjoyment of life 0 What TIME of day is your pain at its worst? no pain Sleep (in general) Good  Pain is worse with: no pain Pain improves with: no pain Relief from Meds: no pain  Mobility walk without assistance  Function not employed: date last employed . disabled: date disabled .  Neuro/Psych numbness confusion  Prior Studies Any changes since last visit?  no  Physicians involved in your care Any changes since last visit?  no   Family History  Problem Relation Age of Onset  . Heart disease Father    History   Social History  . Marital Status: Married    Spouse Name: N/A  . Number of Children: N/A  . Years of Education: N/A   Social History Main Topics  . Smoking status: Former Smoker    Types: Cigarettes    Quit date: 12/17/1999  . Smokeless tobacco: Never Used  . Alcohol Use: 0.0 oz/week  . Drug Use: No  . Sexual Activity: Not on file   Other Topics Concern  . None   Social History Narrative   Past Surgical History  Procedure Laterality Date  . Craniotomy Left 12/15/2012    Procedure: CRANIECTOMY HEMATOMA EVACUATION SUBDURAL WITH PLACEMENT OF BONE FLAP IN ABDOMINAL WALL;  Surgeon: Floyce Stakes, MD;  Location: [facility];   Service: Neurosurgery;  Laterality: Left;  . Craniotomy Left 12/28/2012    Procedure: CRANIOTOMY HEMATOMA EVACUATION SUBDURAL;  Surgeon: Erline Levine, MD;  Location: [facility];  Service: Neurosurgery;  Laterality: Left;  . Peg placement N/A 12/31/2012    Procedure: PERCUTANEOUS ENDOSCOPIC GASTROSTOMY (PEG) PLACEMENT;  Surgeon: Zenovia Jarred, MD;  Location: [facility];  Service: General;  Laterality: N/A;  bedside  peg  . Percutaneous tracheostomy N/A 12/31/2012    Procedure: PERCUTANEOUS TRACHEOSTOMY (BEDSIDE);  Surgeon: Zenovia Jarred, MD;  Location: [facility];  Service: General;  Laterality: N/A;  . Wrist surgery Right 2008  . Cranioplasty N/A 06/10/2013    Procedure: CRANIOPLASTY;  Surgeon: Floyce Stakes, MD;  Location: [facility];  Service: Neurosurgery;  Laterality: N/A;  CRANIOPLASTY   Past Medical History  Diagnosis Date  . Headache(784.0)   . Traumatic brain injury   . 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 Rotator cuff injury     right  . Stroke     some aphasia , weakness in the R arm , Out pt. rehab currently   . Swallowing problem     still relearning controlled swallowing - rec'ing speech therapy in rehab   There were no vitals taken for this visit.  Opioid Risk Score:   Fall  Risk Score: Low Fall Risk (0-5 points)  Review of Systems  Neurological: Positive for numbness and headaches.  All other systems reviewed and are negative.      Objective:   Physical Exam  General: Alert and oriented x 3, No apparent distress. Wearing helmet.  HEENT: Head is normocephalic, atraumatic, PERRLA, EOMI, sclera anicteric, oral mucosa pink and moist, dentition intact, ext ear canals clear,  Neck: Supple without JVD or lymphadenopathy  Heart: Reg rate and rhythm. No murmurs rubs or gallops  Chest: CTA bilaterally without wheezes, rales, or rhonchi; no distress  Abdomen: Soft, non-tender, non-distended, bowel sounds positive.  Extremities: No clubbing, cyanosis, or edema. Pulses are 2+    Skin: Clean and intact without signs of breakdown  Neuro: Pt recalled the month, year. Initiates conversations. Asked pertinent questions. Attention better. Affect very Dynamic. Better insight and awareness. Non-agitated. Right HH, no central deficits appreciated. Doesn't swing the right arm as much with gait. Appears fairly steady on his feet. DTR's 3+ on right.  Musculoskeletal: Right shoulder with minimal tenderness  Psych: Pt's affect is more dynamic and pleasant. Pt is cooperative GU: scrotum not visualized   Assessment:  1. 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 Status post craniotomy evacuation of hematoma. He has made substantial progress but still has significant cognitive deficits which affect memory, attention, energy levels, focus, higher level cognition.  2. Old right shoulder injury, likely RTC tendonitis--improved  3. Seizure post- traumatic  4. Scrotal swelling, pain----?dermatitis  5. Post-traumatic headaches---resolved  Plan:  1. Continue low dose propranolol for headache and mood.  2. Continue exelon at 3mg  BID for memory.  3. Concerta for attention and memory. Second rx for next month..  4. Continue keppra 750mg  bid for now 5. Neuropsych for coping skills, formal testing later this spring.  -asked [name] to check with Dr. Valentina Shaggy about taping their visits so that he can retain more of the information and recs.   6. May not be able to return to driving with his homonymous hemianopsia. Will reach out to Dr. Frederico Hamman regarding this as well. 7. Follow up with me in e months. 30 minutes of face to face patient care time was spent during this visit. He remains pre-vocational. He may pick up his concerta in 2 months.

## 2014-11-23 NOTE — Patient Instructions (Signed)
PLEASE CALL ME WITH ANY PROBLEMS OR QUESTIONS (#[phone]).      

## 2014-11-27 ENCOUNTER — Ambulatory Visit (INDEPENDENT_AMBULATORY_CARE_PROVIDER_SITE_OTHER): Payer: 59 | Admitting: Psychology

## 2014-11-27 ENCOUNTER — Other Ambulatory Visit: Payer: Self-pay | Admitting: Physical Medicine & Rehabilitation

## 2014-11-27 DIAGNOSIS — F068 Other specified mental disorders due to known physiological condition: Secondary | ICD-10-CM

## 2014-11-27 DIAGNOSIS — F4329 Adjustment disorder with other symptoms: Secondary | ICD-10-CM

## 2014-11-27 DIAGNOSIS — S069X0S Unspecified intracranial injury without loss of consciousness, sequela: Principal | ICD-10-CM

## 2014-11-27 DIAGNOSIS — S069X9S Unspecified intracranial injury with loss of consciousness of unspecified duration, sequela: Secondary | ICD-10-CM

## 2014-11-27 DIAGNOSIS — F4323 Adjustment disorder with mixed anxiety and depressed mood: Secondary | ICD-10-CM

## 2014-11-27 NOTE — Progress Notes (Signed)
[facility]  [address]   Telephone [phone] [address] Fax [phone] [place], [place] [address]   Psychology Progress Note   Name:  Albert [name] Date of Birth:  1976-01-29 MRN:  [id number]  Date:11/27/2014 (88m) psychotherapy No new problems or concerns reported. Reviewed recurring themes and problems in his life, including the effects of his cognitive deficits on his family relationships and ability to function in the community. He reported an improved relationship with his 30 year-old daughter as he has lately perceived her to be more responsive or obedient to his efforts to parent her. He continues to characterize his relationship with his wife as marked by her distance and detachment but without much conflict. He blames himself for any changes in their relationship as he is the one who had a TBI. 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 He continues to participate in the "[employer]" exercise program.   His mood continues to be stable and mostly euthymic.Affect slowly becoming more natural. Behavior continues to be socially appropriate and well-controlled. Initiation, thought generation and planning still significant problems. He continues to demonstrate significant memory impairment, both limited carryover for new information (both episodic and semantic) as well as an approximate six year period of retrograde amnesia. He has been more consistently using using a written notebook, at least in these treatment sessions.  Awareness of his cognitive limitations and everyday judgment in that context continue to be good.   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   Diagnostic Impressions: Cognitive Deficits as  late effect of TBI [F06.8] Adjustment Disorder with Mixed Emotional features [F43.23] Traumatic Brain Injury [S06.9X.9S]  Return on 12/16/14  Jamey Ripa, Ph.D Licensed Psychologist

## 2014-12-14 ENCOUNTER — Telehealth: Payer: Self-pay | Admitting: *Deleted
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
## 2014-12-14 NOTE — Telephone Encounter (Signed)
Pt seen by you 11/22/2013, TBI, he is asking for follow up information regarding the possibility of having his driving privileges restored. I relayed the note from his last visit in which you stated 'May not be able to return to driving with his homonymous hemianopsia. Will reach out to Dr. Frederico Hamman regarding this as well'. Patient asked if you had spoken to Dr. Frederico Hamman yet,  and asked if you would call him

## 2014-12-15 NOTE — Telephone Encounter (Signed)
I called Albert [name] to clarify what he was asking for. Albert [name] says you spoke with him, but it is unclear.  The wife's number is [phone] if you have not already spoken with them.

## 2014-12-15 NOTE — Telephone Encounter (Signed)
i spoke to Albert [name] two days ago. i asked him to follow up with dr. Frederico Hamman, as he (like I) was unaware that his visual deficits would automatically preclude him from applying for a drivers license. Dr. Frederico Hamman told me that he would look into the rules about driving/vision-----so he might know more and give the [facility] some feedback

## 2014-12-16 ENCOUNTER — Ambulatory Visit: Payer: 59 | Attending: Psychology | Admitting: Psychology

## 2014-12-16 DIAGNOSIS — F4329 Adjustment disorder with other symptoms: Secondary | ICD-10-CM

## 2014-12-16 DIAGNOSIS — S069X9S Unspecified intracranial injury with loss of consciousness of unspecified duration, sequela: Secondary | ICD-10-CM

## 2014-12-16 DIAGNOSIS — F4323 Adjustment disorder with mixed anxiety and depressed mood: Secondary | ICD-10-CM | POA: Diagnosis not present

## 2014-12-16 DIAGNOSIS — S069X0S Unspecified intracranial injury without loss of consciousness, sequela: Secondary | ICD-10-CM

## 2014-12-16 DIAGNOSIS — R41841 Cognitive communication deficit: Secondary | ICD-10-CM | POA: Insufficient documentation

## 2014-12-16 DIAGNOSIS — F068 Other specified mental disorders due to known physiological condition: Secondary | ICD-10-CM | POA: Diagnosis not present

## 2014-12-16 NOTE — Progress Notes (Signed)
[facility]  [address]   Telephone [phone] [address] Fax [phone] [place], [place] [address]   Psychology Progress Note   Name:  Albert [name] Date of Birth:  08-28-1976 MRN:  [id number]  Date:12/16/2014 (39m) psychotherapy [name] expressed a recurrent theme of feeling underappreciated and disrespected by his wife and daughter. His wife, who joined [place] for the second half of the session, expressed concern about the fractured relationship between [place] and their 92 year-old daughter. She reported that their daughter confided that she views her father as always mad at her and does not want to be alone with him in public. His wife has begun to cue him when his tone sounds overly harsh towards her. There was absolutely no report of any incidences of physical abuse or threatening behavior on [name]'s part. [name] reacted defensively in response to this feedback as he has viewed himself as trying to appropriately parent a somewhat oppositional child.     We discussed the importance of him repairing his relationship with his daughter. He acknowledged feeling helpless about this. As the adult, he needs to accept his daughter's perception of their relationship even though he may feel that his motives and behavior have been misunderstood. We discussed again that the goal is not just a reduction in negative interactions but the occurrance of positive ones. To this end, I recommended that he and his wife consciously plan out and schedule father-daughter interactions during which [name]'s only goal is to interact in a positive and nurturing way with his daughter regardless of her behavior. I do not believe he has the requisite emotional flexibility since his TBI to be able to shift to a positive/caring mode once he has been frustrated by his daughter's behavior in the course of everyday life.    Diagnostic Impressions Cognitive Disorder as a late effect of  Traumatic Brain Injury [F06.8] Adjustment Disorder with Mixed Emotional features [F43.23]  Return in two weeks.   Jamey Ripa, Ph.D Licensed Psychologist

## 2015-01-01 ENCOUNTER — Ambulatory Visit (INDEPENDENT_AMBULATORY_CARE_PROVIDER_SITE_OTHER): Payer: 59 | Admitting: Psychology

## 2015-01-01 ENCOUNTER — Encounter: Payer: Self-pay | Admitting: Psychology

## 2015-01-01 DIAGNOSIS — F4329 Adjustment disorder with other symptoms: Secondary | ICD-10-CM

## 2015-01-01 DIAGNOSIS — F068 Other specified mental disorders due to known physiological condition: Secondary | ICD-10-CM | POA: Diagnosis not present

## 2015-01-01 DIAGNOSIS — F4323 Adjustment disorder with mixed anxiety and depressed mood: Secondary | ICD-10-CM

## 2015-01-01 DIAGNOSIS — S069X0S Unspecified intracranial injury without loss of consciousness, sequela: Principal | ICD-10-CM

## 2015-01-01 NOTE — Progress Notes (Signed)
[facility]  [address]   Telephone [phone] [address] Fax [phone] [place], [place] [address]   Psychology Progress Note   Name:  Albert [name] Date of Birth:  1976-04-10 MRN:  [id number]  Date:01/01/2015 (52m) psychotherapy "I feel frustrated". [name] expressed the familiar theme of feeling underappreciated and somewhat disrespected at home. He has felt annoyed by his wife's spending habits and how the family income has been allocated. 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 He asked about pursuing other volunteer possibilities.   Discussed with him the effects of his persisting cognitive deficits on his ability to generate alternatives and make decisions on how he is perceived and treated by others. Suggested we defer looking into other volunteer or even work opportunities until after neuropsychological testing is completed next month.  His wife did not accompany him to this visit.   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   Jamey Ripa, Ph.D Licensed Psychologist

## 2015-01-15 ENCOUNTER — Ambulatory Visit: Payer: 59 | Attending: Physical Medicine & Rehabilitation | Admitting: Psychology

## 2015-01-15 DIAGNOSIS — F4329 Adjustment disorder with other symptoms: Secondary | ICD-10-CM

## 2015-01-15 DIAGNOSIS — R41841 Cognitive communication deficit: Secondary | ICD-10-CM | POA: Insufficient documentation

## 2015-01-15 DIAGNOSIS — F068 Other specified mental disorders due to known physiological condition: Secondary | ICD-10-CM

## 2015-01-15 DIAGNOSIS — F4323 Adjustment disorder with mixed anxiety and depressed mood: Secondary | ICD-10-CM

## 2015-01-15 DIAGNOSIS — S069X0S Unspecified intracranial injury without loss of consciousness, sequela: Secondary | ICD-10-CM

## 2015-01-15 NOTE — Progress Notes (Signed)
[facility]  [address]   Telephone [phone] [address] Fax [phone] [place], [place] [address]   Psychology Progress Note   Name:  Albert [name] Date of Birth:  Aug 26, 1976 MRN:  [id number]  Date:01/15/2015 (33m) psychotherapy He did not report any changes or major events over the past two weeks. He again spoke of his frustration in feeling disrespected or ignored by his wife and rejected by his daughter. He recalled having attended a child psychotherapy session with his daughter sometime this year but did not find it to be helpful.  His wife did not accompany him to this visit.   Diagnostic Impressions Cognitive Disorder as a late effect of Traumatic Brain Injury [F06.8] Adjustment Disorder with Mixed Emotional features [F43.23]  Discussed his reduced initiation and idea generation due to his TBI. Encouraged him to practice formulating and expressing ideas for activities to do with his wife and/or family. Advised him not to fret if his ideas are rejected as the main goal is for him to improve his skills in this area. Reviewed the purposes and procedures of upcoming neuropsychological testing that is scheduled for the week of 01/25/15.   Will meet again on 02/05/15.     Jamey Ripa, Ph.D Licensed Psychologist

## 2015-01-25 ENCOUNTER — Ambulatory Visit (INDEPENDENT_AMBULATORY_CARE_PROVIDER_SITE_OTHER): Payer: 59 | Admitting: Psychology

## 2015-01-25 ENCOUNTER — Encounter: Payer: Self-pay | Admitting: Psychology

## 2015-01-25 DIAGNOSIS — F068 Other specified mental disorders due to known physiological condition: Secondary | ICD-10-CM | POA: Diagnosis not present

## 2015-01-25 DIAGNOSIS — F4329 Adjustment disorder with other symptoms: Secondary | ICD-10-CM

## 2015-01-25 DIAGNOSIS — S069X0S Unspecified intracranial injury without loss of consciousness, sequela: Secondary | ICD-10-CM

## 2015-01-25 DIAGNOSIS — F4323 Adjustment disorder with mixed anxiety and depressed mood: Secondary | ICD-10-CM | POA: Diagnosis not present

## 2015-01-25 NOTE — Progress Notes (Signed)
[facility]  [address]   Telephone [phone] [address] Fax [phone] [place], [place] [address]   Psychology Progress Note   Name:  LEOCADIO HEAL Date of Birth:  1976-06-08 MRN:  [id number] Date:  01/25/2015  Neuropsychological testing was begun today. A total of 3 hours was spent today administering and scoring neurocognitive tests (CPT [id number] & [id number]).  Diagnostic Impressions: Cognitive Disorder as a late effect of Traumatic Brain Injury [F06.8] Adjustment Disorder with Mixed Emotional features [F43.23]  There were no concerns expressed or behaviors displayed by Vevelyn Francois that would require immediate attention.   A full report will follow once the planned testing has been completed. His next appointment is scheduled for 01/27/15.   Jamey Ripa, Ph.D Licensed Psychologist 01/25/2015

## 2015-01-27 ENCOUNTER — Ambulatory Visit (INDEPENDENT_AMBULATORY_CARE_PROVIDER_SITE_OTHER): Payer: 59 | Admitting: Psychology

## 2015-01-27 DIAGNOSIS — S069X0S Unspecified intracranial injury without loss of consciousness, sequela: Principal | ICD-10-CM

## 2015-01-27 DIAGNOSIS — F4323 Adjustment disorder with mixed anxiety and depressed mood: Secondary | ICD-10-CM

## 2015-01-27 DIAGNOSIS — F068 Other specified mental disorders due to known physiological condition: Secondary | ICD-10-CM

## 2015-01-27 DIAGNOSIS — S069X9S Unspecified intracranial injury with loss of consciousness of unspecified duration, sequela: Secondary | ICD-10-CM | POA: Diagnosis not present

## 2015-01-27 DIAGNOSIS — F4329 Adjustment disorder with other symptoms: Secondary | ICD-10-CM
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
## 2015-02-02 ENCOUNTER — Telehealth: Payer: Self-pay | Admitting: *Deleted

## 2015-02-02 DIAGNOSIS — F909 Attention-deficit hyperactivity disorder, unspecified type: Secondary | ICD-10-CM

## 2015-02-02 MED ORDER — METHYLPHENIDATE HCL ER (OSM) 36 MG PO TBCR: 36.0000 mg | EXTENDED_RELEASE_TABLET | ORAL | Status: DC

## 2015-02-02 NOTE — Telephone Encounter (Signed)
Wife called stating Murphy will need a refill on his Concerta.  Rx printed for Dr Naaman Plummer to sign in the morning and it can be picked up at the front desk.  Mrs [place] notified.

## 2015-02-05 ENCOUNTER — Ambulatory Visit (INDEPENDENT_AMBULATORY_CARE_PROVIDER_SITE_OTHER): Payer: 59 | Admitting: Psychology

## 2015-02-05 DIAGNOSIS — F068 Other specified mental disorders due to known physiological condition: Secondary | ICD-10-CM | POA: Diagnosis not present

## 2015-02-05 DIAGNOSIS — F4329 Adjustment disorder with other symptoms: Secondary | ICD-10-CM

## 2015-02-05 DIAGNOSIS — F4323 Adjustment disorder with mixed anxiety and depressed mood: Secondary | ICD-10-CM | POA: Diagnosis not present

## 2015-02-05 DIAGNOSIS — S069X0S Unspecified intracranial injury without loss of consciousness, sequela: Secondary | ICD-10-CM

## 2015-02-05 NOTE — Progress Notes (Signed)
[facility]  [address]   Telephone [phone] [address] Fax [phone] [place], [place] [address]   Psychology Progress Note   Name:  Albert [name] Date of Birth:  September 17, 1975 MRN:  [id number]  Date:02/05/2015 (29m) psychotherapy Reviewed the results of neuropsychological testing with [facility] and his wife. Focussed on how his cognitive deficits might impact how he relates to family members and his perception of everyday situations at home. Discussed his vocational potential in a supported employment model.  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 He has started wearing prism glasses. Hopefully this will help.  Return in two weeks.   Jamey Ripa, Ph.D Licensed Psychologist

## 2015-02-19 ENCOUNTER — Ambulatory Visit: Payer: 59 | Attending: Physical Medicine & Rehabilitation | Admitting: Psychology

## 2015-02-19 DIAGNOSIS — S069X0S Unspecified intracranial injury without loss of consciousness, sequela: Secondary | ICD-10-CM

## 2015-02-19 DIAGNOSIS — F068 Other specified mental disorders due to known physiological condition: Secondary | ICD-10-CM

## 2015-02-19 DIAGNOSIS — F4323 Adjustment disorder with mixed anxiety and depressed mood: Secondary | ICD-10-CM | POA: Diagnosis not present

## 2015-02-19 DIAGNOSIS — F4329 Adjustment disorder with other symptoms: Secondary | ICD-10-CM

## 2015-02-19 NOTE — Progress Notes (Signed)
[facility]  [address]   Telephone [phone] [address] Fax [phone] [place], [place] [address]   Psychology Progress Note   Name:  Albert [name] Date of Birth:  Oct 22, 1975 MRN:  [id number]  Date:02/19/2015 (33m) psychotherapy [name] came alone. Focus of session was on understanding and dealing with his daughter's behavior that he perceives as disrespectful to him.  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  Jamey Ripa, Ph.D Licensed Psychologist

## 2015-02-23 ENCOUNTER — Encounter: Payer: Self-pay | Admitting: Physical Medicine & Rehabilitation

## 2015-02-23 ENCOUNTER — Encounter: Payer: 59 | Attending: Physical Medicine & Rehabilitation | Admitting: Physical Medicine & Rehabilitation

## 2015-02-23 ENCOUNTER — Other Ambulatory Visit: Payer: Self-pay | Admitting: Physical Medicine & Rehabilitation

## 2015-02-23 VITALS — BP 152/78 | HR 66 | Resp 14

## 2015-02-23 DIAGNOSIS — F0630 Mood disorder due to known physiological condition, unspecified: Secondary | ICD-10-CM

## 2015-02-23 DIAGNOSIS — S069XAS Unspecified intracranial injury with loss of consciousness status unknown, sequela: Secondary | ICD-10-CM

## 2015-02-23 DIAGNOSIS — F900 Attention-deficit hyperactivity disorder, predominantly inattentive type: Secondary | ICD-10-CM

## 2015-02-23 DIAGNOSIS — F39 Unspecified mood [affective] disorder: Secondary | ICD-10-CM

## 2015-02-23 DIAGNOSIS — S065X5S Traumatic subdural hemorrhage with loss of consciousness greater than 24 hours with return to pre-existing conscious level, sequela: Secondary | ICD-10-CM | POA: Diagnosis not present

## 2015-02-23 DIAGNOSIS — F068 Other specified mental disorders due to known physiological condition: Secondary | ICD-10-CM

## 2015-02-23 DIAGNOSIS — S069X9S Unspecified intracranial injury with loss of consciousness of unspecified duration, sequela: Secondary | ICD-10-CM

## 2015-02-23 DIAGNOSIS — R41841 Cognitive communication deficit: Secondary | ICD-10-CM | POA: Insufficient documentation

## 2015-02-23 DIAGNOSIS — S46001S Unspecified injury of muscle(s) and tendon(s) of the rotator cuff of right shoulder, sequela: Secondary | ICD-10-CM | POA: Diagnosis not present

## 2015-02-23 DIAGNOSIS — S069X0S Unspecified intracranial injury without loss of consciousness, sequela: Secondary | ICD-10-CM

## 2015-02-23 DIAGNOSIS — F909 Attention-deficit hyperactivity disorder, unspecified type: Secondary | ICD-10-CM

## 2015-02-23 DIAGNOSIS — R4189 Other symptoms and signs involving cognitive functions and awareness: Secondary | ICD-10-CM

## 2015-02-23 MED ORDER — METHYLPHENIDATE HCL ER (OSM) 36 MG PO TBCR: 36.0000 mg | EXTENDED_RELEASE_TABLET | ORAL | Status: DC

## 2015-02-23 NOTE — Progress Notes (Signed)
Subjective:    Patient ID: Albert [name], male    DOB: 04-12-1976, 39 y.o.   MRN: [id number]  HPI   Albert [name] is here in follow up of his TBI. He has had his neuropsych testing. He was deemed a RLAS and potentially employable in a supported environment. He is seeing a neuro eye rehab specialist who has given him prism lenses and a light retraining lense to help potentiate some recovery.   Mood has been stable. [name] has spent a lot of time this summer with his daughter baby sitting and enterteaining.   He would like to return to work. He has not communicated with [facility] in some time.   Medications remain the same.   Pain Inventory Average Pain 3 Pain Right Now 0 My pain is intermittent, dull and aching  In the last 24 hours, has pain interfered with the following? General activity 0 Relation with others 0 Enjoyment of life 1 What TIME of day is your pain at its worst? evening Sleep (in general) Fair  Pain is worse with: some activites Pain improves with: rest and medication Relief from Meds: 7  Mobility walk without assistance how many minutes can you walk? 45 ability to climb steps?  yes do you drive?  no  Function disabled: date disabled .  Neuro/Psych No problems in this area  Prior Studies Any changes since last visit?  no  Physicians involved in your care Any changes since last visit?  no   Family History  Problem Relation Age of Onset  . Heart disease Father    History   Social History  . Marital Status: Married    Spouse Name: N/A  . Number of Children: N/A  . Years of Education: N/A   Social History Main Topics  . Smoking status: Former Smoker    Types: Cigarettes    Quit date: 12/17/1999  . Smokeless tobacco: Never Used  . Alcohol Use: 0.0 oz/week  . Drug Use: No  . Sexual Activity: Not on file   Other Topics Concern  . None   Social History Narrative   Past Surgical History  Procedure Laterality Date  . Craniotomy Left 12/15/2012    Procedure: CRANIECTOMY HEMATOMA EVACUATION SUBDURAL WITH PLACEMENT OF BONE FLAP IN ABDOMINAL WALL;  Surgeon: Floyce Stakes, MD;  Location: [place] [facility];  Service: Neurosurgery;  Laterality: Left;  . Craniotomy Left 12/28/2012    Procedure: CRANIOTOMY HEMATOMA EVACUATION SUBDURAL;  Surgeon: Erline Levine, MD;  Location: [place] [facility];  Service: Neurosurgery;  Laterality: Left;  . Peg placement N/A 12/31/2012    Procedure: PERCUTANEOUS ENDOSCOPIC GASTROSTOMY (PEG) PLACEMENT;  Surgeon: Zenovia Jarred, MD;  Location: [facility];  Service: General;  Laterality: N/A;  bedside  peg  . Percutaneous tracheostomy N/A 12/31/2012    Procedure: PERCUTANEOUS TRACHEOSTOMY (BEDSIDE);  Surgeon: Zenovia Jarred, MD;  Location: [facility];  Service: General;  Laterality: N/A;  . Wrist surgery Right 2008  . Cranioplasty N/A 06/10/2013    Procedure: CRANIOPLASTY;  Surgeon: Floyce Stakes, MD;  Location: [facility];  Service: Neurosurgery;  Laterality: N/A;  CRANIOPLASTY   Past Medical History  Diagnosis Date  . Headache(784.0)   . Traumatic brain injury   . 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 Rotator cuff injury     right  . Stroke     some aphasia , weakness in the R arm , Out pt. rehab currently   . Swallowing problem  still relearning controlled swallowing - rec'ing speech therapy in rehab   BP 152/78 mmHg  Pulse 66  Resp 14  SpO2 99%  Opioid Risk Score:   Fall Risk Score: Low Fall Risk (0-5 points)`1  Depression screen PHQ 2/9  Depression screen PHQ 2/9 04/30/2013  Decreased Interest 0  Down, Depressed, Hopeless 0  PHQ - 2 Score 0     Review of Systems  Constitutional: Negative.   Eyes: Negative.   Respiratory: Negative.   Cardiovascular: Negative.   Gastrointestinal: Negative.   Endocrine: Negative.   Genitourinary: Negative.   Musculoskeletal: Negative.   Skin: Negative.   Allergic/Immunologic: Negative.   Neurological: Positive for headaches.  Hematological: Negative.     Psychiatric/Behavioral: Negative.        Objective:   Physical Exam   General: Alert and oriented x 3, No apparent distress. Wearing helmet.  HEENT: Head is normocephalic, atraumatic, PERRLA, EOMI, sclera anicteric, oral mucosa pink and moist, dentition intact, ext ear canals clear,  Neck: Supple without JVD or lymphadenopathy  Heart: Reg rate and rhythm. No murmurs rubs or gallops  Chest: CTA bilaterally without wheezes, rales, or rhonchi; no distress  Abdomen: Soft, non-tender, non-distended, bowel sounds positive.  Extremities: No clubbing, cyanosis, or edema. Pulses are 2+  Skin: Clean and intact without signs of breakdown   Neuro: Pt recalled the month, year. Initiates conversations. Asked pertinent questions. Attention better. Affect very Dynamic. Improved insight and awareness. Non-agitated. Right HH, no central deficits appreciated. gait normal.  Musculoskeletal: Right shoulder with minimal tenderness  Psych: Pt's affect is more dynamic and pleasant. Pt is cooperative GU: scrotum not visualized   Assessment:  1. 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 Status post craniotomy evacuation of hematoma. He has made substantial progress but still has significant cognitive deficits which affect memory, attention, energy levels, focus, higher level cognition.  2. Old right shoulder injury, likely RTC tendonitis--improved  3. Seizure post- traumatic  4. Scrotal swelling, pain----?dermatitis  5. Post-traumatic headaches---resolved    Plan:  1. Continue low dose propranolol for headache and mood--effective.  2. Continue exelon at 3mg  BID for memory.  3. Concerta for attention and memory. Second rx for next month. May pick up rxes for months 3 and 4 after two months.  4. Continue keppra 750mg  bid for now  5. Made a referral to [facility]. Gave pt information for [facility]  6. Unable to return to driving to right HH. Continue rehab efforts per MD in [place]   7. Follow up with me in 4 months. 30 minutes of face to face patient care time was spent during this visit. He remains pre-vocational. He may pick up his concerta in 2 months.

## 2015-02-23 NOTE — Patient Instructions (Signed)
PLEASE CALL ME WITH ANY PROBLEMS OR QUESTIONS (#[phone]).  HAVE A GOOD DAY!    

## 2015-03-02 ENCOUNTER — Encounter: Payer: 59 | Admitting: Psychology

## 2015-03-04 ENCOUNTER — Encounter: Payer: 59 | Admitting: Psychology

## 2015-03-09 ENCOUNTER — Ambulatory Visit (INDEPENDENT_AMBULATORY_CARE_PROVIDER_SITE_OTHER): Payer: 59 | Admitting: Psychology

## 2015-03-09 DIAGNOSIS — F068 Other specified mental disorders due to known physiological condition: Secondary | ICD-10-CM | POA: Diagnosis not present

## 2015-03-09 DIAGNOSIS — S069X0S Unspecified intracranial injury without loss of consciousness, sequela: Secondary | ICD-10-CM

## 2015-03-09 DIAGNOSIS — F4329 Adjustment disorder with other symptoms: Secondary | ICD-10-CM

## 2015-03-09 DIAGNOSIS — F4323 Adjustment disorder with mixed anxiety and depressed mood: Secondary | ICD-10-CM | POA: Diagnosis not present

## 2015-03-09 DIAGNOSIS — R4189 Other symptoms and signs involving cognitive functions and awareness: Secondary | ICD-10-CM

## 2015-03-09 NOTE — Progress Notes (Signed)
[facility]  [address]   Telephone [phone] [address] Fax [phone] [place], [place] [address]   Psychology Progress Note   Name:  Albert [name] Date of Birth:  [birth year] MRN:  [id number]  Date:03/09/2015 (61m psychotherapy Met with [name] alone and then together with his wife and s73year old daughter. His daughter described him as "nervous" since his TBI but perceives him as being less nervous lately.    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  Return in two weeks.   MJamey Ripa Ph.D Licensed Psychologist

## 2015-03-24 ENCOUNTER — Ambulatory Visit: Payer: 59 | Attending: Physical Medicine & Rehabilitation | Admitting: Psychology

## 2015-03-24 DIAGNOSIS — F4329 Adjustment disorder with other symptoms: Secondary | ICD-10-CM

## 2015-03-24 DIAGNOSIS — S069X0S Unspecified intracranial injury without loss of consciousness, sequela: Secondary | ICD-10-CM | POA: Diagnosis not present

## 2015-03-24 DIAGNOSIS — F4323 Adjustment disorder with mixed anxiety and depressed mood: Secondary | ICD-10-CM | POA: Diagnosis not present

## 2015-03-24 DIAGNOSIS — F068 Other specified mental disorders due to known physiological condition: Secondary | ICD-10-CM | POA: Diagnosis not present

## 2015-03-24 DIAGNOSIS — R4189 Other symptoms and signs involving cognitive functions and awareness: Secondary | ICD-10-CM

## 2015-03-24 NOTE — Progress Notes (Signed)
[facility]  [address]   Telephone [phone] [address] Fax [phone] [place],  [address]   Psychology Progress Note   Name:  Albert [name] Date of Birth:  [birth year] MRN:  [id number]  Date:03/24/2015 (46m) psychotherapy He cited his ongoing frustration with his daughter's perceived oppositional behavior and disagreement with his wife over financial decisions. We also addressed some of his personality changes post-TBI, especially no longer since his TBI making "smart ass comments"  in a humorous manner due to a combination of difficulty generating humor and fear of saying something that would make him appear to have a brain injury.  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 Encouraged him to experiment with the balance between being cautious/inhibited versus being more of a "smart ass" with friends and family. He appears to be more often writing down information to be remembered.  Return in 2 weeks.  Jamey Ripa, Ph.D Licensed Psychologist

## 2015-04-05 ENCOUNTER — Telehealth: Payer: Self-pay | Admitting: *Deleted

## 2015-04-05 MED ORDER — PROPRANOLOL HCL 20 MG PO TABS: 20.0000 mg | ORAL_TABLET | Freq: Two times a day (BID) | ORAL | Status: DC

## 2015-04-05 NOTE — Telephone Encounter (Signed)
Albert [name]' wife called for a refill on his propanolol.  E scribed to [facility].

## 2015-04-08 ENCOUNTER — Ambulatory Visit (INDEPENDENT_AMBULATORY_CARE_PROVIDER_SITE_OTHER): Payer: 59 | Admitting: Psychology

## 2015-04-08 DIAGNOSIS — F068 Other specified mental disorders due to known physiological condition: Secondary | ICD-10-CM | POA: Diagnosis not present

## 2015-04-08 DIAGNOSIS — F4329 Adjustment disorder with other symptoms: Secondary | ICD-10-CM

## 2015-04-08 DIAGNOSIS — F4323 Adjustment disorder with mixed anxiety and depressed mood: Secondary | ICD-10-CM | POA: Diagnosis not present

## 2015-04-08 DIAGNOSIS — S069X0S Unspecified intracranial injury without loss of consciousness, sequela: Secondary | ICD-10-CM

## 2015-04-08 NOTE — Progress Notes (Signed)
[facility]  [address]   Telephone [phone] [address] Fax [phone] [place], [place] [address]   Psychology Progress Note   Name:  Albert [name] Date of Birth:  February 11, 1976 MRN:  [id number]  Date:04/08/2015 (78m psychotherapy "It's so frustrating". [name] spoke of his frustration with his wife as he perceives her as not respecting him, not treating him as a partner and emotionally detached from him.    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 Many of the issues he speaks of often occur in relationships where one partner has suffered a TBI. I have not met with his wife for some time due to her work schedule.  Return in two weeks.   MJamey Ripa Ph.D Licensed Psychologist

## 2015-04-22 ENCOUNTER — Ambulatory Visit: Payer: 59 | Admitting: Psychology

## 2015-05-06 ENCOUNTER — Telehealth: Payer: Self-pay | Admitting: Physical Medicine & Rehabilitation

## 2015-05-06 DIAGNOSIS — F909 Attention-deficit hyperactivity disorder, unspecified type: Secondary | ICD-10-CM

## 2015-05-06 NOTE — Telephone Encounter (Signed)
Patient needs a refill on Concerta, please call Luana at [phone]

## 2015-05-07 MED ORDER — METHYLPHENIDATE HCL ER (OSM) 36 MG PO TBCR
36.0000 mg | EXTENDED_RELEASE_TABLET | ORAL | Status: DC
Start: 2015-05-07 — End: 2015-06-11

## 2015-05-07 NOTE — Telephone Encounter (Signed)
Refill rx written

## 2015-05-07 NOTE — Telephone Encounter (Signed)
Spoke to wife and she is aware that prescription is ready to be picked up

## 2015-05-20 ENCOUNTER — Ambulatory Visit: Payer: 59 | Attending: Physical Medicine & Rehabilitation | Admitting: Psychology

## 2015-05-20 DIAGNOSIS — S069X0S Unspecified intracranial injury without loss of consciousness, sequela: Secondary | ICD-10-CM

## 2015-05-20 DIAGNOSIS — F4323 Adjustment disorder with mixed anxiety and depressed mood: Secondary | ICD-10-CM | POA: Diagnosis not present

## 2015-05-20 DIAGNOSIS — F068 Other specified mental disorders due to known physiological condition: Secondary | ICD-10-CM

## 2015-05-20 DIAGNOSIS — F4329 Adjustment disorder with other symptoms: Secondary | ICD-10-CM

## 2015-05-20 NOTE — Progress Notes (Addendum)
[facility]  [address]   Telephone [phone] [address] Fax [phone] [place], [place] [address]   Psychology Progress Note   Name:  Albert [name] Date of Birth:  [birth year] MRN:  [id number]  Date:05/20/2015 (69m) psychotherapy His father suddenly died last month at the age of 62. We discussed his reactions to this. He also continues to express frustration about his marital relationship, primarily due to his perception that his wife finds ways to avoid interacting with him and takes what he does at home for granted.    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 His wife has not accompanied him to sessions over the past several weeks, reportedly due to her work schedule (though I have never required or insisted that she participate in his treatment). I suggested that marriage counseling might be an option to consider. Marriages after one partner suffers a TBI have a high rate of dissolution.  Return in two weeks.   Jamey Ripa, Ph.D Licensed Psychologist

## 2015-06-03 ENCOUNTER — Ambulatory Visit (INDEPENDENT_AMBULATORY_CARE_PROVIDER_SITE_OTHER): Payer: 59 | Admitting: Psychology

## 2015-06-03 ENCOUNTER — Telehealth: Payer: Self-pay | Admitting: *Deleted

## 2015-06-03 DIAGNOSIS — F068 Other specified mental disorders due to known physiological condition: Secondary | ICD-10-CM | POA: Diagnosis not present

## 2015-06-03 DIAGNOSIS — F4323 Adjustment disorder with mixed anxiety and depressed mood: Secondary | ICD-10-CM | POA: Diagnosis not present

## 2015-06-03 DIAGNOSIS — S069X0S Unspecified intracranial injury without loss of consciousness, sequela: Secondary | ICD-10-CM | POA: Diagnosis not present

## 2015-06-03 DIAGNOSIS — F4329 Adjustment disorder with other symptoms: Secondary | ICD-10-CM

## 2015-06-03 NOTE — Telephone Encounter (Signed)
Andreas wife called to get a refill on his concerta.  His next appt is not until 06/22/15.  He filled his concerta last on 0/27/74 so that he will be out 9/27.  Dr Naaman Plummer has an appt available for 06/07/15 so I have called and left a message for Mrs Lowrey to call us back if he can come to the appt 12:20 for a 12:40 appt.  I have put Hy in that slot and will cancel the October appt if we get confirmation  He can come Monday.

## 2015-06-04 NOTE — Progress Notes (Signed)
[facility]  [address]   Telephone [phone] [address] Fax [phone] [place], [place] [address]   Psychology Progress Note  Name:  Albert [name] Date of Birth:  [birth year] MRN:  [id number]  Date:06/04/2015 (62m) psychotherapy "It's so frustrating". He repeated the recurrent theme of feeling underappreciated and ignored by family members. He also expressed dissatisfaction over his inability to drive, which he accepts. He denied depressed mood, suicidal thinking and behavioral dyscontrol.  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 Revisit seeking volunteer position? Agreed to my suggestion that we change to meet every 3 weeks.    Jamey Ripa, Ph.D Licensed Psychologist

## 2015-06-04 NOTE — Telephone Encounter (Signed)
Mr. Canning has been scheduled for Monday 06-07-15.

## 2015-06-07 ENCOUNTER — Encounter: Payer: 59 | Attending: Physical Medicine & Rehabilitation | Admitting: Physical Medicine & Rehabilitation

## 2015-06-07 DIAGNOSIS — X58XXXS Exposure to other specified factors, sequela: Secondary | ICD-10-CM | POA: Insufficient documentation

## 2015-06-07 DIAGNOSIS — Z79899 Other long term (current) drug therapy: Secondary | ICD-10-CM | POA: Insufficient documentation

## 2015-06-07 DIAGNOSIS — R4189 Other symptoms and signs involving cognitive functions and awareness: Secondary | ICD-10-CM | POA: Insufficient documentation

## 2015-06-07 DIAGNOSIS — G4089 Other seizures: Secondary | ICD-10-CM | POA: Insufficient documentation

## 2015-06-07 DIAGNOSIS — R42 Dizziness and giddiness: Secondary | ICD-10-CM | POA: Insufficient documentation

## 2015-06-07 DIAGNOSIS — M779 Enthesopathy, unspecified: Secondary | ICD-10-CM | POA: Insufficient documentation

## 2015-06-07 DIAGNOSIS — Z87891 Personal history of nicotine dependence: Secondary | ICD-10-CM | POA: Insufficient documentation

## 2015-06-07 DIAGNOSIS — F39 Unspecified mood [affective] disorder: Secondary | ICD-10-CM | POA: Insufficient documentation

## 2015-06-07 DIAGNOSIS — Z8673 Personal history of transient ischemic attack (TIA), and cerebral infarction without residual deficits: Secondary | ICD-10-CM | POA: Insufficient documentation

## 2015-06-07 DIAGNOSIS — R531 Weakness: Secondary | ICD-10-CM | POA: Insufficient documentation

## 2015-06-07 DIAGNOSIS — R4701 Aphasia: Secondary | ICD-10-CM | POA: Insufficient documentation

## 2015-06-07 DIAGNOSIS — R51 Headache: Secondary | ICD-10-CM | POA: Insufficient documentation

## 2015-06-07 DIAGNOSIS — S065X5S Traumatic subdural hemorrhage with loss of consciousness greater than 24 hours with return to pre-existing conscious level, sequela: Secondary | ICD-10-CM | POA: Insufficient documentation

## 2015-06-07 DIAGNOSIS — F068 Other specified mental disorders due to known physiological condition: Secondary | ICD-10-CM | POA: Insufficient documentation

## 2015-06-07 DIAGNOSIS — L309 Dermatitis, unspecified: Secondary | ICD-10-CM | POA: Insufficient documentation

## 2015-06-11 ENCOUNTER — Encounter (HOSPITAL_BASED_OUTPATIENT_CLINIC_OR_DEPARTMENT_OTHER): Payer: 59 | Admitting: Physical Medicine & Rehabilitation

## 2015-06-11 ENCOUNTER — Encounter: Payer: Self-pay | Admitting: Physical Medicine & Rehabilitation

## 2015-06-11 VITALS — BP 127/75 | HR 65 | Resp 16

## 2015-06-11 DIAGNOSIS — S069X9S Unspecified intracranial injury with loss of consciousness of unspecified duration, sequela: Secondary | ICD-10-CM

## 2015-06-11 DIAGNOSIS — G4089 Other seizures: Secondary | ICD-10-CM | POA: Diagnosis not present

## 2015-06-11 DIAGNOSIS — F909 Attention-deficit hyperactivity disorder, unspecified type: Secondary | ICD-10-CM

## 2015-06-11 DIAGNOSIS — F900 Attention-deficit hyperactivity disorder, predominantly inattentive type: Secondary | ICD-10-CM | POA: Diagnosis not present

## 2015-06-11 DIAGNOSIS — R51 Headache: Secondary | ICD-10-CM | POA: Diagnosis not present

## 2015-06-11 DIAGNOSIS — R42 Dizziness and giddiness: Secondary | ICD-10-CM | POA: Diagnosis not present

## 2015-06-11 DIAGNOSIS — X58XXXS Exposure to other specified factors, sequela: Secondary | ICD-10-CM | POA: Diagnosis not present

## 2015-06-11 DIAGNOSIS — R4189 Other symptoms and signs involving cognitive functions and awareness: Secondary | ICD-10-CM | POA: Diagnosis not present

## 2015-06-11 DIAGNOSIS — F068 Other specified mental disorders due to known physiological condition: Secondary | ICD-10-CM | POA: Diagnosis not present

## 2015-06-11 DIAGNOSIS — Z79899 Other long term (current) drug therapy: Secondary | ICD-10-CM | POA: Diagnosis not present

## 2015-06-11 DIAGNOSIS — R4701 Aphasia: Secondary | ICD-10-CM | POA: Diagnosis not present

## 2015-06-11 DIAGNOSIS — S065X5S Traumatic subdural hemorrhage with loss of consciousness greater than 24 hours with return to pre-existing conscious level, sequela: Secondary | ICD-10-CM

## 2015-06-11 DIAGNOSIS — F0630 Mood disorder due to known physiological condition, unspecified: Secondary | ICD-10-CM

## 2015-06-11 DIAGNOSIS — M779 Enthesopathy, unspecified: Secondary | ICD-10-CM | POA: Diagnosis not present

## 2015-06-11 DIAGNOSIS — S069X0S Unspecified intracranial injury without loss of consciousness, sequela: Secondary | ICD-10-CM

## 2015-06-11 DIAGNOSIS — S069XAS Unspecified intracranial injury with loss of consciousness status unknown, sequela: Secondary | ICD-10-CM

## 2015-06-11 DIAGNOSIS — Z87891 Personal history of nicotine dependence: Secondary | ICD-10-CM | POA: Diagnosis not present

## 2015-06-11 DIAGNOSIS — F39 Unspecified mood [affective] disorder: Secondary | ICD-10-CM

## 2015-06-11 DIAGNOSIS — R531 Weakness: Secondary | ICD-10-CM | POA: Diagnosis not present

## 2015-06-11 DIAGNOSIS — Z8673 Personal history of transient ischemic attack (TIA), and cerebral infarction without residual deficits: Secondary | ICD-10-CM | POA: Diagnosis not present

## 2015-06-11 DIAGNOSIS — L309 Dermatitis, unspecified: Secondary | ICD-10-CM | POA: Diagnosis not present

## 2015-06-11 MED ORDER — METHYLPHENIDATE HCL ER (OSM) 36 MG PO TBCR: 36.0000 mg | EXTENDED_RELEASE_TABLET | ORAL | Status: DC

## 2015-06-11 MED ORDER — LEVETIRACETAM 500 MG PO TABS: 500.0000 mg | ORAL_TABLET | Freq: Two times a day (BID) | ORAL | Status: DC

## 2015-06-11 NOTE — Patient Instructions (Addendum)
TRY TAKING KEPPRA 500MG  IN AM AND 750MG  IN PM UNTIL 750MG  ARE GONE THEN DECREASE 500MG  TWICE DAILY   YOU MAY CALL TO PICK UP CONCERTA IN 2 MONTHS.

## 2015-06-11 NOTE — Progress Notes (Signed)
Subjective:    Patient ID: Albert [name], male    DOB: 07/12/1976, 39 y.o.   MRN: [id number]  HPI   Albert [name] is here in follow up of his TBI. Things have remained stable since I last saw him. They never followed through with him apparently?  He has been "bored" at home. When he does go out and does something, it really wears him out.   Behaviorally, he's been pretty well controlled at home. He only really seems irritable when he's tired.       Pain Inventory Average Pain 0 Pain Right Now 0 My pain is no pain  In the last 24 hours, has pain interfered with the following? General activity 0 Relation with others 0 Enjoyment of life 0 What TIME of day is your pain at its worst? no pain Sleep (in general) Good  Pain is worse with: some activites Pain improves with: rest Relief from Meds: no pain  Mobility walk without assistance how many minutes can you walk? 45 ability to climb steps?  yes do you drive?  no Do you have any goals in this area?  no  Function not employed: date last employed . disabled: date disabled .  Neuro/Psych weakness trouble walking dizziness confusion  Prior Studies Any changes since last visit?  no  Physicians involved in your care Any changes since last visit?  no   Family History  Problem Relation Age of Onset  . Heart disease Father    Social History   Social History  . Marital Status: Married    Spouse Name: N/A  . Number of Children: N/A  . Years of Education: N/A   Social History Main Topics  . Smoking status: Former Smoker    Types: Cigarettes    Quit date: 12/17/1999  . Smokeless tobacco: Never Used  . Alcohol Use: 0.0 oz/week  . Drug Use: No  . Sexual Activity: Not Asked   Other Topics Concern  . None   Social History Narrative   Past Surgical History  Procedure Laterality Date  . Craniotomy Left 12/15/2012    Procedure: CRANIECTOMY HEMATOMA EVACUATION SUBDURAL WITH PLACEMENT OF BONE FLAP IN ABDOMINAL WALL;   Surgeon: Floyce Stakes, MD;  Location: [facility];  Service: Neurosurgery;  Laterality: Left;  . Craniotomy Left 12/28/2012    Procedure: CRANIOTOMY HEMATOMA EVACUATION SUBDURAL;  Surgeon: Erline Levine, MD;  Location: [place] [facility];  Service: Neurosurgery;  Laterality: Left;  . Peg placement N/A 12/31/2012    Procedure: PERCUTANEOUS ENDOSCOPIC GASTROSTOMY (PEG) PLACEMENT;  Surgeon: Zenovia Jarred, MD;  Location: [facility];  Service: General;  Laterality: N/A;  bedside  peg  . Percutaneous tracheostomy N/A 12/31/2012    Procedure: PERCUTANEOUS TRACHEOSTOMY (BEDSIDE);  Surgeon: Zenovia Jarred, MD;  Location: [facility];  Service: General;  Laterality: N/A;  . Wrist surgery Right 2008  . Cranioplasty N/A 06/10/2013    Procedure: CRANIOPLASTY;  Surgeon: Floyce Stakes, MD;  Location: [facility];  Service: Neurosurgery;  Laterality: N/A;  CRANIOPLASTY   Past Medical History  Diagnosis Date  . Headache(784.0)   . Traumatic brain injury   . 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 Rotator cuff injury     right  . Stroke     some aphasia , weakness in the R arm , Out pt. rehab currently   . Swallowing problem     still relearning controlled swallowing - rec'ing speech therapy in rehab   BP 127/75  mmHg  Pulse 65  Resp 16  SpO2 96%  Opioid Risk Score:   Fall Risk Score:  `1  Depression screen PHQ 2/9  Depression screen PHQ 2/9 04/30/2013  Decreased Interest 0  Down, Depressed, Hopeless 0  PHQ - 2 Score 0     Review of Systems  Constitutional: Positive for unexpected weight change.  Musculoskeletal: Positive for gait problem.  Neurological: Positive for dizziness and weakness.  Psychiatric/Behavioral: Positive for confusion.  All other systems reviewed and are negative.      Objective:   Physical Exam   General: Alert and oriented x 3, No apparent distress. Wearing helmet.  HEENT: Head is normocephalic, atraumatic, PERRLA, EOMI, sclera anicteric, oral mucosa pink and moist,  dentition intact, ext ear canals clear,  Neck: Supple without JVD or lymphadenopathy  Heart: Reg rate and rhythm. No murmurs rubs or gallops  Chest: CTA bilaterally without wheezes, rales, or rhonchi; no distress  Abdomen: Soft, non-tender, non-distended, bowel sounds positive.  Extremities: No clubbing, cyanosis, or edema. Pulses are 2+  Skin: Clean and intact without signs of breakdown  Neuro: Pt recalled the month, year. Initiates conversations. Asked pertinent questions. Attention better. Affect very Dynamic. Improved insight and awareness. Non-agitated. Right HH, no central deficits appreciated. gait normal.  Musculoskeletal: Right shoulder with minimal tenderness  Psych: Pt's affect is more dynamic and pleasant. Pt is cooperative GU: scrotum not visualized   Assessment:  1. 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 Status post craniotomy evacuation of hematoma. He has made substantial progress but still has significant cognitive deficits which affect memory, attention, energy levels, focus, higher level cognition.  2. Old right shoulder injury, likely RTC tendonitis--improved  3. Seizure post- traumatic  4. Scrotal swelling, pain----?dermatitis  5. Post-traumatic headaches---resolved    Plan:  1. Continue low dose propranolol for headache and mood--effective.  2. Continue exelon at 3mg  BID for memory.  3. Concerta for attention and memory. Second rx for next month. May pick up rxes for months 3 and 4 after two months.  4. Continue keppra 750mg  bid for now---decrease to 750mg  HS and 500mg  qam for 3-4 weeks then down to 500mg  bid thereafter.  5. [facility] if patient feels that he wants to pursue  6. Unable to return to driving to right HH. Continue rehab efforts per MD in [place]  7. Follow up with me in 4 months. 30 minutes of face to face patient care time was spent during this visit. He remains pre-vocational. He may pick up his concerta in 2 months.

## 2015-06-22 ENCOUNTER — Ambulatory Visit: Payer: 59 | Admitting: Physical Medicine & Rehabilitation

## 2015-06-24 ENCOUNTER — Ambulatory Visit: Payer: 59 | Attending: Physical Medicine & Rehabilitation | Admitting: Psychology

## 2015-06-24 DIAGNOSIS — R4189 Other symptoms and signs involving cognitive functions and awareness: Secondary | ICD-10-CM

## 2015-06-24 DIAGNOSIS — F4323 Adjustment disorder with mixed anxiety and depressed mood: Secondary | ICD-10-CM | POA: Diagnosis not present

## 2015-06-24 DIAGNOSIS — F068 Other specified mental disorders due to known physiological condition: Secondary | ICD-10-CM | POA: Diagnosis not present

## 2015-06-24 DIAGNOSIS — S069X0S Unspecified intracranial injury without loss of consciousness, sequela: Secondary | ICD-10-CM

## 2015-06-24 DIAGNOSIS — F4329 Adjustment disorder with other symptoms: Secondary | ICD-10-CM

## 2015-06-24 NOTE — Progress Notes (Signed)
[facility]  [address]   Telephone [phone] [address] Fax [phone] [place], [place] [address]   Psychology Progress Note   Name:  Albert [name] Date of Birth:  1976/07/14 MRN:  [id number]  Date:06/24/2015 (89m) psychotherapy No major changes. He expressed frustration with his family relationships. No report of symptoms of depression or behavioral dyscontrol.  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 Revisited the idea of him volunteering or doing some kind of unskilled work.  Return in two weeks (his choice).   Jamey Ripa, Ph.D Licensed Psychologist

## 2015-06-25 ENCOUNTER — Other Ambulatory Visit: Payer: Self-pay | Admitting: Physical Medicine & Rehabilitation

## 2015-07-09 ENCOUNTER — Ambulatory Visit (INDEPENDENT_AMBULATORY_CARE_PROVIDER_SITE_OTHER): Payer: 59 | Admitting: Psychology

## 2015-07-09 DIAGNOSIS — F068 Other specified mental disorders due to known physiological condition: Secondary | ICD-10-CM

## 2015-07-09 DIAGNOSIS — F4323 Adjustment disorder with mixed anxiety and depressed mood: Secondary | ICD-10-CM

## 2015-07-09 DIAGNOSIS — S069X0S Unspecified intracranial injury without loss of consciousness, sequela: Secondary | ICD-10-CM

## 2015-07-09 DIAGNOSIS — F4329 Adjustment disorder with other symptoms: Secondary | ICD-10-CM

## 2015-07-09 NOTE — Progress Notes (Signed)
[facility]  [address]   Telephone [phone] [address] Fax [phone] [place], [place] [address]   Psychology Progress Note   Name:  SHERRI LEVENHAGEN Date of Birth:  07-24-1976 MRN:  [id number]  Date:07/09/2015 (47m) psychotherapy No major changes. Re-visited the possibly of him volunteering somewhere. Discussed new ways to exercise. No report of symptoms of depression or behavioral dyscontrol.  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  Return in two to three weeks.   Jamey Ripa, Ph.D Licensed Psychologist

## 2015-07-27 ENCOUNTER — Other Ambulatory Visit: Payer: Self-pay | Admitting: Physical Medicine & Rehabilitation

## 2015-07-29 ENCOUNTER — Ambulatory Visit: Payer: 59 | Attending: Physical Medicine & Rehabilitation | Admitting: Psychology

## 2015-07-29 DIAGNOSIS — F4323 Adjustment disorder with mixed anxiety and depressed mood: Secondary | ICD-10-CM

## 2015-07-29 DIAGNOSIS — S069X0S Unspecified intracranial injury without loss of consciousness, sequela: Secondary | ICD-10-CM

## 2015-07-29 DIAGNOSIS — F4329 Adjustment disorder with other symptoms: Secondary | ICD-10-CM

## 2015-07-29 DIAGNOSIS — F068 Other specified mental disorders due to known physiological condition: Secondary | ICD-10-CM

## 2015-07-29 NOTE — Progress Notes (Signed)
[facility]  [address]   Telephone [phone] [address] Fax [phone] [place], [place] [address]   Psychology Progress Note   Name:  Albert [name] Date of Birth:  1976/01/28 MRN:  [id number]  Date:07/29/2015 (57m) psychotherapy His wife joined [place] for part of this session. He is doing about the same. He maintains a well-established routine at home. Fatigue is still a major problem. No behavioral or mood issues reported. She expressed surprise at how muted his emotional reaction has seemed to his father's death. [name] confirmed that he has been grieving in his own way. His wife reported that he has been consistently writing information down to compensate for his poor memory.  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   Jamey Ripa, Ph.D Licensed Psychologist

## 2015-08-02 ENCOUNTER — Other Ambulatory Visit: Payer: Self-pay | Admitting: Physical Medicine & Rehabilitation

## 2015-08-10 ENCOUNTER — Telehealth: Payer: Self-pay

## 2015-08-10 DIAGNOSIS — F909 Attention-deficit hyperactivity disorder, unspecified type: Secondary | ICD-10-CM

## 2015-08-10 MED ORDER — METHYLPHENIDATE HCL ER (OSM) 36 MG PO TBCR: 36.0000 mg | EXTENDED_RELEASE_TABLET | ORAL | Status: DC

## 2015-08-10 NOTE — Telephone Encounter (Signed)
Pt called and is requesting a refill on Concerta. Please advise?

## 2015-08-10 NOTE — Telephone Encounter (Signed)
May have 2 new scripts. i'll sign tomorrow if you could print out. thanks

## 2015-08-10 NOTE — Telephone Encounter (Signed)
Scripts have been printed. Awaiting signature.

## 2015-08-11 NOTE — Telephone Encounter (Signed)
Called patient to inform that scripts are printed, signed, and ready for pick up

## 2015-08-13 ENCOUNTER — Ambulatory Visit: Payer: 59 | Attending: Physical Medicine & Rehabilitation | Admitting: Psychology

## 2015-08-13 DIAGNOSIS — F4323 Adjustment disorder with mixed anxiety and depressed mood: Secondary | ICD-10-CM

## 2015-08-13 DIAGNOSIS — F068 Other specified mental disorders due to known physiological condition: Secondary | ICD-10-CM | POA: Diagnosis not present

## 2015-08-13 DIAGNOSIS — S069X0S Unspecified intracranial injury without loss of consciousness, sequela: Secondary | ICD-10-CM | POA: Diagnosis not present

## 2015-08-13 DIAGNOSIS — F4329 Adjustment disorder with other symptoms: Secondary | ICD-10-CM

## 2015-08-13 NOTE — Progress Notes (Signed)
[facility]  [address]   Telephone [phone] [address] Fax [phone] [place], [place] [address]   Psychology Progress Note   Name:  Albert [name] Date of Birth:  [birth year] MRN:  [id number]  Date:08/13/2015 (80m psychotherapy [name] reported that he met with someone at his [employer] a volunteer position. No life changes or new stressors reported. As typical, he spoke of his dissatisfaction with the lack of attention or willful disregard he receives from his wife. Invited his wife in. [name] was able to initiate a dialogue with her about why they no longer talk much to each other and (with cuing) his desire for more interaction. His wife spoke of how the changes in his personality, initiation and cognitive abilities since his TBI have thwarted their ability to connect.   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 They both readily agreed. Will give [name] some names of local marital counselors.  Return in two weeks.   MJamey Ripa Ph.D Licensed Psychologist

## 2015-08-16 ENCOUNTER — Telehealth: Payer: Self-pay

## 2015-08-16 NOTE — Telephone Encounter (Signed)
Pt called stating that he had an issue refilling his Concerta. Pt received two scripts, and they tried to fill the script with the "do not fill date." Advised pt to bring other script to pharmacy to get it filled.

## 2015-08-17 NOTE — Telephone Encounter (Signed)
Patient called in stating that both rx's say "do not fill til the end of December" one had the saying hand written on there and the other was typed on it. Please Advise.

## 2015-08-17 NOTE — Telephone Encounter (Signed)
I spoke with pharmacist and they will fill if he brings in today.  I have given Albert [name] this information.  If there is a problem pharmacy can call us.

## 2015-08-17 NOTE — Telephone Encounter (Signed)
Can we call his pharmacy and activate one rx?----not sure why one was typed on-----i don't type that information on rx'es---thanks!!

## 2015-08-31 ENCOUNTER — Ambulatory Visit (INDEPENDENT_AMBULATORY_CARE_PROVIDER_SITE_OTHER): Payer: 59 | Admitting: Psychology

## 2015-08-31 DIAGNOSIS — S069X0S Unspecified intracranial injury without loss of consciousness, sequela: Secondary | ICD-10-CM | POA: Diagnosis not present

## 2015-08-31 DIAGNOSIS — F068 Other specified mental disorders due to known physiological condition: Secondary | ICD-10-CM | POA: Diagnosis not present

## 2015-08-31 DIAGNOSIS — F4329 Adjustment disorder with other symptoms: Secondary | ICD-10-CM

## 2015-08-31 DIAGNOSIS — Z630 Problems in relationship with spouse or partner: Secondary | ICD-10-CM

## 2015-08-31 DIAGNOSIS — F4323 Adjustment disorder with mixed anxiety and depressed mood: Secondary | ICD-10-CM

## 2015-08-31 NOTE — Progress Notes (Signed)
[facility]  [address]   Telephone [phone] [address] Fax [phone] [place], [place] [address]   Psychology Progress Note   Name:  Albert [name] Date of Birth:  March 28, 1976 MRN:  [id number]  Date:08/31/2015 (4m) psychotherapy He continues to focus on his dissatisfaction with his marital relationship. He reported that they have not yet contacted the marital therapist but plan to. 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 Suggested that given his reduced initiation, he ahead of time generate specific topics of interest to [place] conversations with his wife. Encouraged him to reflect on what interests they shared and what kept them connected prior to his TBI, which will be a  challenging task for him given his memory gaps for past events. Urged him to follow-up on marriage counseling.     Return in two weeks.    Jamey Ripa, Ph.D Licensed Psychologist

## 2015-09-16 ENCOUNTER — Ambulatory Visit: Payer: 59 | Attending: Physical Medicine & Rehabilitation | Admitting: Psychology

## 2015-09-16 DIAGNOSIS — F068 Other specified mental disorders due to known physiological condition: Secondary | ICD-10-CM | POA: Diagnosis not present

## 2015-09-16 DIAGNOSIS — F4329 Adjustment disorder with other symptoms: Secondary | ICD-10-CM

## 2015-09-16 DIAGNOSIS — S069X0S Unspecified intracranial injury without loss of consciousness, sequela: Secondary | ICD-10-CM | POA: Diagnosis not present

## 2015-09-16 DIAGNOSIS — Z630 Problems in relationship with spouse or partner: Secondary | ICD-10-CM

## 2015-09-16 DIAGNOSIS — F4323 Adjustment disorder with mixed anxiety and depressed mood: Secondary | ICD-10-CM

## 2015-09-16 NOTE — Progress Notes (Signed)
[facility]  [address]   Telephone [phone] [address] Fax [phone] [place], [place] [address]   Psychology Progress Note   Name:  Albert [name] Date of Birth:  1975-09-18 MRN:  [id number]  Date:09/16/2015 (32m) psychotherapy He was focused exclusively on marital dissatisfaction.He reported that they have an appointment with the marital therapist I suggested on 10/04/15.  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 Also suggested that he read a humorous book/audio book on dealing with the trials and tribulations of family life.  Return on 10/05/15.    Jamey Ripa, Ph.D Licensed Psychologist

## 2015-09-22 ENCOUNTER — Encounter: Payer: 59 | Attending: Physical Medicine & Rehabilitation | Admitting: Physical Medicine & Rehabilitation

## 2015-09-22 ENCOUNTER — Encounter: Payer: Self-pay | Admitting: Physical Medicine & Rehabilitation

## 2015-09-22 VITALS — BP 133/78 | HR 70 | Resp 14

## 2015-09-22 DIAGNOSIS — S069X0A Unspecified intracranial injury without loss of consciousness, initial encounter: Secondary | ICD-10-CM | POA: Insufficient documentation

## 2015-09-22 DIAGNOSIS — Z87891 Personal history of nicotine dependence: Secondary | ICD-10-CM | POA: Insufficient documentation

## 2015-09-22 DIAGNOSIS — F909 Attention-deficit hyperactivity disorder, unspecified type: Secondary | ICD-10-CM

## 2015-09-22 DIAGNOSIS — R1310 Dysphagia, unspecified: Secondary | ICD-10-CM | POA: Insufficient documentation

## 2015-09-22 DIAGNOSIS — N5089 Other specified disorders of the male genital organs: Secondary | ICD-10-CM | POA: Diagnosis not present

## 2015-09-22 DIAGNOSIS — Z8673 Personal history of transient ischemic attack (TIA), and cerebral infarction without residual deficits: Secondary | ICD-10-CM | POA: Insufficient documentation

## 2015-09-22 DIAGNOSIS — R4189 Other symptoms and signs involving cognitive functions and awareness: Secondary | ICD-10-CM | POA: Diagnosis not present

## 2015-09-22 DIAGNOSIS — R4701 Aphasia: Secondary | ICD-10-CM | POA: Insufficient documentation

## 2015-09-22 DIAGNOSIS — Z8249 Family history of ischemic heart disease and other diseases of the circulatory system: Secondary | ICD-10-CM | POA: Diagnosis not present

## 2015-09-22 DIAGNOSIS — R569 Unspecified convulsions: Secondary | ICD-10-CM | POA: Diagnosis not present

## 2015-09-22 DIAGNOSIS — Z79899 Other long term (current) drug therapy: Secondary | ICD-10-CM

## 2015-09-22 DIAGNOSIS — Z5181 Encounter for therapeutic drug level monitoring: Secondary | ICD-10-CM

## 2015-09-22 DIAGNOSIS — S066X9A Traumatic subarachnoid hemorrhage with loss of consciousness of unspecified duration, initial encounter: Secondary | ICD-10-CM | POA: Diagnosis not present

## 2015-09-22 DIAGNOSIS — F900 Attention-deficit hyperactivity disorder, predominantly inattentive type: Secondary | ICD-10-CM

## 2015-09-22 MED ORDER — METHYLPHENIDATE HCL ER (OSM) 36 MG PO TBCR
36.0000 mg | EXTENDED_RELEASE_TABLET | ORAL | Status: DC
Start: 2015-09-22 — End: 2015-09-22

## 2015-09-22 MED ORDER — METHYLPHENIDATE HCL ER (OSM) 36 MG PO TBCR: 36.0000 mg | EXTENDED_RELEASE_TABLET | ORAL | Status: DC

## 2015-09-22 NOTE — Patient Instructions (Addendum)
PLEASE CALL ME WITH ANY PROBLEMS OR QUESTIONS [id number]).     TRY TAKING THE CONCERTA AN HOUR LATER TO SEE IF IT HELPS WITH YOUR LATE DAY FATIGUE.  TRY GOING TO BED AN HOUR LATER (CLOSER TO 10PM)  CONSIDER HEADPHONES WITH CLASSICAL MUSIC OR OTHER SOOTHING BACKGROUND SOUNDS.   ?INCREASE EXELON DOSE.  IS THE CURRENT DOSE HELPING??  DISCUSS WITH LIBRARY REGARDING TAKING ON MORE RESPONSIBILITIES

## 2015-09-22 NOTE — Addendum Note (Signed)
Addended by: Geryl Rankins D on: 09/22/2015 11:59 AM   Modules accepted: Orders

## 2015-09-22 NOTE — Progress Notes (Signed)
Subjective:    Patient ID: Albert [name], male    DOB: 13-Aug-1976, 40 y.o.   MRN: [id number]  HPI   Albert [name] is here in follow up of his TBI. He is volunteering in the [employer]. He has a hard time coming to terms with the fact that he's doing that compared to his life before the injury.   He experiences fatigue late in the day, but then has a hard time falling asleep when it comes to actually going to bed.   His wife reports that he's doing better with the reduced keppra. He is taking on activities around the home and seems to be doing well with them.    Pain Inventory Average Pain 0 Pain Right Now 0 My pain is sharp  In the last 24 hours, has pain interfered with the following? General activity 9 Relation with others 9 Enjoyment of life 9 What TIME of day is your pain at its worst? morning, evening Sleep (in general) Fair  Pain is worse with: sitting and unsure Pain improves with: rest and medication Relief from Meds: 7  Mobility walk without assistance how many minutes can you walk? 30 ability to climb steps?  yes do you drive?  no Do you have any goals in this area?  yes  Function disabled: date disabled . Do you have any goals in this area?  yes  Neuro/Psych weakness  Prior Studies Any changes since last visit?  no  Physicians involved in your care Any changes since last visit?  no   Family History  Problem Relation Age of Onset  . Heart disease Father    Social History   Social History  . Marital Status: Married    Spouse Name: N/A  . Number of Children: N/A  . Years of Education: N/A   Social History Main Topics  . Smoking status: Former Smoker    Types: Cigarettes    Quit date: 12/17/1999  . Smokeless tobacco: Never Used  . Alcohol Use: 0.0 oz/week  . Drug Use: No  . Sexual Activity: Not Asked   Other Topics Concern  . None   Social History Narrative   Past Surgical History  Procedure Laterality Date  . Craniotomy  Left 12/15/2012    Procedure: CRANIECTOMY HEMATOMA EVACUATION SUBDURAL WITH PLACEMENT OF BONE FLAP IN ABDOMINAL WALL;  Surgeon: Floyce Stakes, MD;  Location: [place] [facility];  Service: Neurosurgery;  Laterality: Left;  . Craniotomy Left 12/28/2012    Procedure: CRANIOTOMY HEMATOMA EVACUATION SUBDURAL;  Surgeon: Erline Levine, MD;  Location: [place] [facility];  Service: Neurosurgery;  Laterality: Left;  . Peg placement N/A 12/31/2012    Procedure: PERCUTANEOUS ENDOSCOPIC GASTROSTOMY (PEG) PLACEMENT;  Surgeon: Zenovia Jarred, MD;  Location: [place];  Service: General;  Laterality: N/A;  bedside  peg  . Percutaneous tracheostomy N/A 12/31/2012    Procedure: PERCUTANEOUS TRACHEOSTOMY (BEDSIDE);  Surgeon: Zenovia Jarred, MD;  Location: [place];  Service: General;  Laterality: N/A;  . Wrist surgery Right 2008  . Cranioplasty N/A 06/10/2013    Procedure: CRANIOPLASTY;  Surgeon: Floyce Stakes, MD;  Location: [facility];  Service: Neurosurgery;  Laterality: N/A;  CRANIOPLASTY   Past Medical History  Diagnosis Date  . Headache(784.0)   . Traumatic brain injury ([place])   . Seizures ([place])     12/28/2012  . Rotator cuff injury     right  . Stroke [facility])     some aphasia , weakness in  the R arm , Out pt. rehab currently   . Swallowing problem     still relearning controlled swallowing - rec'ing speech therapy in rehab   BP 133/78 mmHg  Pulse 70  Resp 14  SpO2 96%  Opioid Risk Score:   Fall Risk Score:  `1  Depression screen PHQ 2/9  Depression screen PHQ 2/9 04/30/2013  Decreased Interest 0  Down, Depressed, Hopeless 0  PHQ - 2 Score 0     Review of Systems  Constitutional: Positive for unexpected weight change.  All other systems reviewed and are negative.      Objective:   Physical Exam  General: Alert and oriented x 3, No apparent distress. Wearing helmet.  HEENT: Head is normocephalic, atraumatic, PERRLA, EOMI, sclera anicteric, oral mucosa pink and moist, dentition intact, ext  ear canals clear,  Neck: Supple without JVD or lymphadenopathy  Heart: Reg rate and rhythm. No murmurs rubs or gallops  Chest: CTA bilaterally without wheezes, rales, or rhonchi; no distress  Abdomen: Soft, non-tender, non-distended, bowel sounds positive.  Extremities: No clubbing, cyanosis, or edema. Pulses are 2+  Skin: Clean and intact without signs of breakdown  Neuro: Pt recalled the month, year. Initiates conversations. Asked pertinent questions. Attention better. Affect very Dynamic. Improved insight and awareness. Non-agitated. Right HH, no central deficits appreciated. gait normal.  Musculoskeletal: Right shoulder with minimal tenderness  Psych: Pt's affect is more dynamic and pleasant. Pt is cooperative GU: scrotum not visualized    Assessment:  1. 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 Status post craniotomy evacuation of hematoma. He has made substantial progress but still has significant cognitive deficits which affect memory, attention, energy levels, focus, higher level cognition.  2. Old right shoulder injury, likely RTC tendonitis--improved  3. Seizure post- traumatic  4. Scrotal swelling, pain----?dermatitis  5. Post-traumatic headaches---resolved    Plan:  1. Continue low dose propranolol for headache and mood--effective.  2. Continue exelon at 3mg  BID for memory. Consider increase to 6mg ? 3. Concerta for attention and memory---try taking an hour later. Second rx for next month. May pick up rxes for months 3 and 4 after two months.  4. Continue keppra 500mg  bid  .  5. VR if patient feels that he wants to pursue. Encouraged him to ask the [place] if he can take on more responsibility. 6. Unable to return to driving to right HH.   7. Follow up with me in 4 months. 30 minutes of face to face patient care time was spent during this visit. He remains pre-vocational. He may pick up his concerta in 2 months.

## 2015-09-28 LAB — TOXASSURE SELECT,+ANTIDEPR,UR: PDF: 0

## 2015-10-04 NOTE — Progress Notes (Signed)
Urine drug screen for this encounter is consistent for prescribed medication 

## 2015-10-05 ENCOUNTER — Ambulatory Visit (INDEPENDENT_AMBULATORY_CARE_PROVIDER_SITE_OTHER): Payer: 59 | Admitting: Psychology

## 2015-10-05 DIAGNOSIS — F068 Other specified mental disorders due to known physiological condition: Secondary | ICD-10-CM

## 2015-10-05 DIAGNOSIS — F4329 Adjustment disorder with other symptoms: Secondary | ICD-10-CM

## 2015-10-05 DIAGNOSIS — S069X0S Unspecified intracranial injury without loss of consciousness, sequela: Secondary | ICD-10-CM

## 2015-10-05 DIAGNOSIS — F4323 Adjustment disorder with mixed anxiety and depressed mood: Secondary | ICD-10-CM

## 2015-10-05 DIAGNOSIS — R4189 Other symptoms and signs involving cognitive functions and awareness: Secondary | ICD-10-CM

## 2015-10-05 DIAGNOSIS — Z630 Problems in relationship with spouse or partner: Secondary | ICD-10-CM

## 2015-10-05 NOTE — Progress Notes (Signed)
[facility]  [address]   Telephone [phone] [address] Fax [phone] [place], [place] [address]   Psychology Progress Note   Name:  Albert [name] Date of Birth:  23-Jan-1976 MRN:  [id number]  Date:10/05/2015 (60m) psychotherapy Discussed his impressions of he and his wife's initial marital counseling session. Focussed on how his communication patterns (e.g., he tends not to express himself when feeling negative because he fears that he will say the wrong thing) contribute to their marital problems. He reports that he continues to volunteer at [employer] though does not derive much satisfaction from this.   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  Jamey Ripa, Ph.D Licensed Psychologist

## 2015-10-19 ENCOUNTER — Other Ambulatory Visit: Payer: Self-pay | Admitting: Physical Medicine & Rehabilitation

## 2015-10-25 ENCOUNTER — Ambulatory Visit: Payer: 59 | Attending: Physical Medicine & Rehabilitation | Admitting: Psychology

## 2015-10-25 DIAGNOSIS — F068 Other specified mental disorders due to known physiological condition: Secondary | ICD-10-CM | POA: Diagnosis not present

## 2015-10-25 DIAGNOSIS — F4323 Adjustment disorder with mixed anxiety and depressed mood: Secondary | ICD-10-CM

## 2015-10-25 DIAGNOSIS — F4329 Adjustment disorder with other symptoms: Secondary | ICD-10-CM

## 2015-10-25 DIAGNOSIS — Z630 Problems in relationship with spouse or partner: Secondary | ICD-10-CM

## 2015-10-25 DIAGNOSIS — S069X0S Unspecified intracranial injury without loss of consciousness, sequela: Secondary | ICD-10-CM

## 2015-10-25 NOTE — Progress Notes (Signed)
[facility]  [address]   Telephone [phone] [address] Fax [phone] [place], [place] [address]   Psychology Progress Note   Name:  KETCHER LOTTMAN Date of Birth:  December 17, 1975 MRN:  [id number]  Date:10/25/2015 (67m) psychotherapy [name] expresssed frustration associated with the theme of lack of control, specifically how his wife handles their finances as well as his inability to drive. He stated his belief that his relationship with his daughter has improved as she now seeks him out with questions. He continues to go to marriage counseling.  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   Jamey Ripa, Ph.D Licensed Psychologist

## 2015-11-16 ENCOUNTER — Encounter: Payer: Self-pay | Admitting: Psychology

## 2015-11-16 ENCOUNTER — Ambulatory Visit: Payer: Medicare Other | Attending: Physical Medicine & Rehabilitation | Admitting: Psychology

## 2015-11-16 DIAGNOSIS — F068 Other specified mental disorders due to known physiological condition: Secondary | ICD-10-CM | POA: Diagnosis not present

## 2015-11-16 DIAGNOSIS — Z630 Problems in relationship with spouse or partner: Secondary | ICD-10-CM

## 2015-11-16 DIAGNOSIS — S069X0S Unspecified intracranial injury without loss of consciousness, sequela: Secondary | ICD-10-CM | POA: Diagnosis not present

## 2015-11-16 DIAGNOSIS — F4329 Adjustment disorder with other symptoms: Secondary | ICD-10-CM

## 2015-11-16 DIAGNOSIS — F4323 Adjustment disorder with mixed anxiety and depressed mood: Secondary | ICD-10-CM

## 2015-11-16 NOTE — Progress Notes (Signed)
[facility]  [address]   Telephone [phone] [address] Fax [phone] [place], [place] [address]   Psychology Progress Note   Name:  Albert [name] Date of Birth:  01-21-1976 MRN:  [id number]  Date:11/16/2015 (18m) psychotherapy [name] as usual expressed his frustration about his wife's behavior, which he perceives as distancing and rejecting. When asked, he was not able to express how he might be contributing to the decline in their relationship. We invited his wife in for the last part of the session. They both expressed discontent with the other though also committent to finding ways to improve their relationship. They continue in marital counseling.  He reported that his volunteer position at [employer] is going well.  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   Jamey Ripa, Ph.D Licensed Psychologist

## 2015-12-07 ENCOUNTER — Ambulatory Visit (INDEPENDENT_AMBULATORY_CARE_PROVIDER_SITE_OTHER): Payer: Medicare Other | Admitting: Psychology

## 2015-12-07 DIAGNOSIS — F4323 Adjustment disorder with mixed anxiety and depressed mood: Secondary | ICD-10-CM | POA: Diagnosis not present

## 2015-12-07 DIAGNOSIS — F4329 Adjustment disorder with other symptoms: Secondary | ICD-10-CM

## 2015-12-07 DIAGNOSIS — Z630 Problems in relationship with spouse or partner: Secondary | ICD-10-CM | POA: Diagnosis not present

## 2015-12-07 DIAGNOSIS — F068 Other specified mental disorders due to known physiological condition: Secondary | ICD-10-CM

## 2015-12-07 DIAGNOSIS — S069X0S Unspecified intracranial injury without loss of consciousness, sequela: Principal | ICD-10-CM

## 2015-12-07 NOTE — Progress Notes (Signed)
[facility]  [address]   Telephone [phone] [address] Fax [phone] [place], [place] [address]   Psychology Progress Note   Name:  Albert [name] Date of Birth:  [birth year] MRN:  [id number]  Date:12/07/2015 (35m) psychotherapy [name] asked about identfying other volunteer positions. 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 He was assisted in submitting an online application to the [facility]. He also plans to look into volunteer opportunities at [facility].   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  Jamey Ripa, Ph.D Licensed Psychologist

## 2015-12-15 ENCOUNTER — Telehealth: Payer: Self-pay | Admitting: *Deleted

## 2015-12-15 DIAGNOSIS — F909 Attention-deficit hyperactivity disorder, unspecified type: Secondary | ICD-10-CM

## 2015-12-15 MED ORDER — METHYLPHENIDATE HCL ER (OSM) 36 MG PO TBCR: 36.0000 mg | EXTENDED_RELEASE_TABLET | ORAL | Status: DC

## 2015-12-15 NOTE — Telephone Encounter (Signed)
Luana called for a refill on [employer].  His next appt is 01/18/16.  His last fill was 11/18/15 and this RX should be just enoughto get to that appt.  Rx printed for Albert [name] to sign. Notified Luana it will be ready this afternoon.

## 2015-12-28 ENCOUNTER — Ambulatory Visit: Payer: Medicare Other | Attending: Physical Medicine & Rehabilitation | Admitting: Psychology

## 2015-12-28 DIAGNOSIS — S069X0S Unspecified intracranial injury without loss of consciousness, sequela: Secondary | ICD-10-CM

## 2015-12-28 DIAGNOSIS — F4329 Adjustment disorder with other symptoms: Secondary | ICD-10-CM

## 2015-12-28 DIAGNOSIS — Z630 Problems in relationship with spouse or partner: Secondary | ICD-10-CM

## 2015-12-28 DIAGNOSIS — F068 Other specified mental disorders due to known physiological condition: Secondary | ICD-10-CM

## 2015-12-28 DIAGNOSIS — F4323 Adjustment disorder with mixed anxiety and depressed mood: Secondary | ICD-10-CM

## 2015-12-28 NOTE — Progress Notes (Signed)
[facility]  [address]   Telephone [phone] [address] Fax [phone] [place], [place] [address]   Psychology Progress Note   Name:  Albert [name] Date of Birth:  [birth year] MRN:  [id number]  Date:12/28/2015 (7m) psychotherapy No new symptoms or problems reported. He reported that he enjoyed a recent vacation with his family. His wife recently lost her job. They continue to see a marital counselor, most recently yesterday. He has yet to hear back from the [facility] regarding volunteer opportunities.  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 Discussed benefits of aerobic exercise. He and his wife may try to do some exercising together now that she is out of work. Asked him to start using an audio tape recorder to help him with memory, at the outset at least in individual and marital therapy sessions, as the slowness of his handwriting and reduced legibility have essentially negated the benefits of note taking to compensate for memory loss.   Return in 3 weeks.   Jamey Ripa, Ph.D Licensed Psychologist

## 2016-01-06 ENCOUNTER — Other Ambulatory Visit: Payer: Self-pay | Admitting: Physical Medicine & Rehabilitation

## 2016-01-18 ENCOUNTER — Encounter: Payer: 59 | Attending: Physical Medicine & Rehabilitation | Admitting: Physical Medicine & Rehabilitation

## 2016-01-18 ENCOUNTER — Encounter: Payer: Self-pay | Admitting: Physical Medicine & Rehabilitation

## 2016-01-18 VITALS — BP 125/82 | HR 59 | Resp 16

## 2016-01-18 DIAGNOSIS — G894 Chronic pain syndrome: Secondary | ICD-10-CM

## 2016-01-18 DIAGNOSIS — G8929 Other chronic pain: Secondary | ICD-10-CM | POA: Insufficient documentation

## 2016-01-18 DIAGNOSIS — Z87891 Personal history of nicotine dependence: Secondary | ICD-10-CM | POA: Diagnosis not present

## 2016-01-18 DIAGNOSIS — R569 Unspecified convulsions: Secondary | ICD-10-CM | POA: Insufficient documentation

## 2016-01-18 DIAGNOSIS — N5089 Other specified disorders of the male genital organs: Secondary | ICD-10-CM | POA: Diagnosis not present

## 2016-01-18 DIAGNOSIS — Z79899 Other long term (current) drug therapy: Secondary | ICD-10-CM | POA: Diagnosis not present

## 2016-01-18 DIAGNOSIS — S065X0D Traumatic subdural hemorrhage without loss of consciousness, subsequent encounter: Secondary | ICD-10-CM | POA: Diagnosis present

## 2016-01-18 DIAGNOSIS — S066X0D Traumatic subarachnoid hemorrhage without loss of consciousness, subsequent encounter: Secondary | ICD-10-CM | POA: Diagnosis present

## 2016-01-18 DIAGNOSIS — F068 Other specified mental disorders due to known physiological condition: Secondary | ICD-10-CM

## 2016-01-18 DIAGNOSIS — S069X0S Unspecified intracranial injury without loss of consciousness, sequela: Secondary | ICD-10-CM

## 2016-01-18 DIAGNOSIS — R4189 Other symptoms and signs involving cognitive functions and awareness: Secondary | ICD-10-CM | POA: Diagnosis not present

## 2016-01-18 DIAGNOSIS — X58XXXD Exposure to other specified factors, subsequent encounter: Secondary | ICD-10-CM | POA: Diagnosis not present

## 2016-01-18 DIAGNOSIS — F900 Attention-deficit hyperactivity disorder, predominantly inattentive type: Secondary | ICD-10-CM

## 2016-01-18 DIAGNOSIS — F0630 Mood disorder due to known physiological condition, unspecified: Secondary | ICD-10-CM

## 2016-01-18 DIAGNOSIS — S069X9S Unspecified intracranial injury with loss of consciousness of unspecified duration, sequela: Secondary | ICD-10-CM

## 2016-01-18 DIAGNOSIS — Z5181 Encounter for therapeutic drug level monitoring: Secondary | ICD-10-CM

## 2016-01-18 DIAGNOSIS — R1310 Dysphagia, unspecified: Secondary | ICD-10-CM | POA: Diagnosis not present

## 2016-01-18 DIAGNOSIS — Z8673 Personal history of transient ischemic attack (TIA), and cerebral infarction without residual deficits: Secondary | ICD-10-CM | POA: Insufficient documentation

## 2016-01-18 DIAGNOSIS — F39 Unspecified mood [affective] disorder: Secondary | ICD-10-CM

## 2016-01-18 DIAGNOSIS — Z9889 Other specified postprocedural states: Secondary | ICD-10-CM | POA: Insufficient documentation

## 2016-01-18 DIAGNOSIS — S065X9D Traumatic subdural hemorrhage with loss of consciousness of unspecified duration, subsequent encounter: Secondary | ICD-10-CM | POA: Diagnosis not present

## 2016-01-18 DIAGNOSIS — R531 Weakness: Secondary | ICD-10-CM | POA: Insufficient documentation

## 2016-01-18 DIAGNOSIS — F909 Attention-deficit hyperactivity disorder, unspecified type: Secondary | ICD-10-CM

## 2016-01-18 MED ORDER — METHYLPHENIDATE HCL ER (OSM) 36 MG PO TBCR
36.0000 mg | EXTENDED_RELEASE_TABLET | ORAL | Status: DC
Start: 2016-01-18 — End: 2016-03-13

## 2016-01-18 MED ORDER — RIVASTIGMINE TARTRATE 6 MG PO CAPS: 6.0000 mg | ORAL_CAPSULE | Freq: Two times a day (BID) | ORAL | Status: DC

## 2016-01-18 MED ORDER — METHYLPHENIDATE HCL ER (OSM) 36 MG PO TBCR: 36.0000 mg | EXTENDED_RELEASE_TABLET | ORAL | Status: DC

## 2016-01-18 NOTE — Addendum Note (Signed)
Addended by: Gerald Leitz A on: 01/18/2016 02:48 PM   Modules accepted: Orders

## 2016-01-18 NOTE — Progress Notes (Signed)
Subjective:    Patient ID: Albert [name], male    DOB: Jun 05, 1976, 40 y.o.   MRN: [id number]  HPI   Albert [name] is here in follow up of his TBI. He is looking at a Psychologist, occupational job at the [employer].  He grew bored of [employer].   He is stable from a cognitive standpoint. He uses reminders on his phone/calendar to keep himself organized. He continues on concerta and exelon for concentration and memory. His wife notices a big difference when he doesn't have the concerta. He is much better with his organizational skills, memory aides, etc.     Pain Inventory Average Pain NA Pain Right Now NA My pain is NA  In the last 24 hours, has pain interfered with the following? General activity NA Relation with others NA Enjoyment of life NA What TIME of day is your pain at its worst? NA Sleep (in general) NA  Pain is worse with: NA Pain improves with: NA Relief from Meds: NA  Mobility walk without assistance ability to climb steps?  yes do you drive?  no Do you have any goals in this area?  yes  Function disabled: date disabled NA  Neuro/Psych confusion  Prior Studies Any changes since last visit?  no  Physicians involved in your care Any changes since last visit?  no   Family History  Problem Relation Age of Onset  . Heart disease Father    Social History   Social History  . Marital Status: Married    Spouse Name: N/A  . Number of Children: N/A  . Years of Education: N/A   Social History Main Topics  . Smoking status: Former Smoker    Types: Cigarettes    Quit date: 12/17/1999  . Smokeless tobacco: Never Used  . Alcohol Use: 0.0 oz/week  . Drug Use: No  . Sexual Activity: Not Asked   Other Topics Concern  . None   Social History Narrative   Past Surgical History  Procedure Laterality Date  . Craniotomy Left 12/15/2012    Procedure: CRANIECTOMY HEMATOMA EVACUATION SUBDURAL WITH PLACEMENT OF BONE FLAP IN ABDOMINAL WALL;  Surgeon: Floyce Stakes, MD;  Location:  [place] [facility];  Service: Neurosurgery;  Laterality: Left;  . Craniotomy Left 12/28/2012    Procedure: CRANIOTOMY HEMATOMA EVACUATION SUBDURAL;  Surgeon: Erline Levine, MD;  Location: [place] [facility];  Service: Neurosurgery;  Laterality: Left;  . Peg placement N/A 12/31/2012    Procedure: PERCUTANEOUS ENDOSCOPIC GASTROSTOMY (PEG) PLACEMENT;  Surgeon: Zenovia Jarred, MD;  Location: [place];  Service: General;  Laterality: N/A;  bedside  peg  . Percutaneous tracheostomy N/A 12/31/2012    Procedure: PERCUTANEOUS TRACHEOSTOMY (BEDSIDE);  Surgeon: Zenovia Jarred, MD;  Location: [facility] [place];  Service: General;  Laterality: N/A;  . Wrist surgery Right 2008  . Cranioplasty N/A 06/10/2013    Procedure: CRANIOPLASTY;  Surgeon: Floyce Stakes, MD;  Location: [facility];  Service: Neurosurgery;  Laterality: N/A;  CRANIOPLASTY   Past Medical History  Diagnosis Date  . Headache(784.0)   . Traumatic brain injury ([place])   . Seizures ([place])     12/28/2012  . Rotator cuff injury     right  . Stroke [facility])     some aphasia , weakness in the R arm , Out pt. rehab currently   . Swallowing problem     still relearning controlled swallowing - rec'ing speech therapy in rehab   BP 125/82 mmHg  Pulse 59  Resp 16  SpO2 98%  Opioid Risk Score:   Fall Risk Score:  `1  Depression screen PHQ 2/9  Depression screen PHQ 2/9 04/30/2013  Decreased Interest 0  Down, Depressed, Hopeless 0  PHQ - 2 Score 0       Review of Systems     Objective:   Physical Exam  General: Alert and oriented x 3, No apparent distress. Wearing helmet.  HEENT: Head is normocephalic, atraumatic, PERRLA, EOMI, sclera anicteric, oral mucosa pink and moist, dentition intact, ext ear canals clear,  Neck: Supple without JVD or lymphadenopathy  Heart: Reg rate and rhythm. No murmurs rubs or gallops  Chest: CTA bilaterally without wheezes, rales, or rhonchi; no distress  Abdomen: Soft, non-tender, non-distended, bowel sounds positive.    Extremities: No clubbing, cyanosis, or edema. Pulses are 2+  Skin: Clean and intact without signs of breakdown  Neuro: Pt recalled the month, year. Initiates conversations. Asked pertinent questions. Attention better. Affect very Dynamic. Improved insight and awareness. Non-agitated. Right HH, no central deficits appreciated. gait normal.  Musculoskeletal: Right shoulder with minimal tenderness  Psych: Pt's affect is more dynamic and pleasant. Pt is cooperative GU: scrotum not visualized    Assessment:  1. 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 Status post craniotomy evacuation of hematoma. He has made substantial progress but still has significant cognitive deficits which affect memory, attention, energy levels, focus, higher level cognition.  2. Old right shoulder injury, likely RTC tendonitis--improved  3. Seizure post- traumatic  4. Scrotal swelling, pain----?dermatitis  5. Post-traumatic headaches---resolved    Plan:  1. Continue low dose propranolol for headache and mood--effective---continue 2. Continue exelon at 3mg  BID for memory--- increase to 6mg . They will call me regarding any intolerance or for lack of effect 3. Concerta for attention and memory---try taking an hour later. Second rx for next month. May pick up rxes for months 3 and 4 after two months.  4. Continue keppra 500mg  bid .  5. Continue vocational reentry--volunteering  6. Unable to return to driving to right HH.  7. Follow up with me in 4 months. 30 minutes of face to face patient care time was spent during this visit. He remains pre-vocational. He may pick up his concerta in 2 months.

## 2016-01-18 NOTE — Patient Instructions (Addendum)
PLEASE CALL ME WITH ANY PROBLEMS OR QUESTIONS [id number]).     IF YOU HAVE ANY ISSUES WITH THE EXELON OR ITO DOESN'T HAVE A POSITIVE EFFECT ON YOUR DAY TO DAY MEMORY AFTER A MONTH, GIVE ME A CALL AND WE'LL DISCUSS THE PLAN.

## 2016-01-20 ENCOUNTER — Ambulatory Visit: Payer: 59 | Admitting: Psychology

## 2016-01-26 ENCOUNTER — Ambulatory Visit: Payer: 59 | Attending: Physical Medicine & Rehabilitation | Admitting: Psychology

## 2016-01-26 DIAGNOSIS — Z630 Problems in relationship with spouse or partner: Secondary | ICD-10-CM

## 2016-01-26 DIAGNOSIS — F4323 Adjustment disorder with mixed anxiety and depressed mood: Secondary | ICD-10-CM

## 2016-01-26 DIAGNOSIS — F068 Other specified mental disorders due to known physiological condition: Secondary | ICD-10-CM

## 2016-01-26 DIAGNOSIS — F4329 Adjustment disorder with other symptoms: Secondary | ICD-10-CM

## 2016-01-26 DIAGNOSIS — S069X0S Unspecified intracranial injury without loss of consciousness, sequela: Secondary | ICD-10-CM

## 2016-01-26 LAB — TOXASSURE SELECT,+ANTIDEPR,UR

## 2016-01-26 NOTE — Progress Notes (Signed)
[facility]  [address]   Telephone [phone] [address] Fax [phone] [place],  [address]   Psychology Progress Note   Name:  ZAHIR MOMINEE Date of Birth:  07/17/1976 MRN:  [id number]  Date:01/26/2016 (6m) psychotherapy No new changes or problems reported. Some increased family stress due to his wife having lost her job a few weeks ago but no changes in marital relationship. He reports being pleased that his 25 year-old daughter has lately been more frequently seeking out his company. He has increased his daily exercise. He volunteered one time at the [facility].   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 He needs this level of structure to succeed. He so worries about offending others that he tends to act in a passive, non-assertive manner and then does not seek information or help that he needs. Continue to emphasize to him the need to use of organizational and memory aids.  Return in three weeks.   Jamey Ripa, Ph.D Licensed Psychologist

## 2016-01-26 NOTE — Progress Notes (Signed)
Urine drug screen for this encounter is consistent for prescribed medication 

## 2016-02-16 ENCOUNTER — Ambulatory Visit: Payer: 59 | Attending: Physical Medicine & Rehabilitation | Admitting: Psychology

## 2016-02-16 DIAGNOSIS — F4323 Adjustment disorder with mixed anxiety and depressed mood: Secondary | ICD-10-CM

## 2016-02-16 DIAGNOSIS — Z630 Problems in relationship with spouse or partner: Secondary | ICD-10-CM

## 2016-02-16 DIAGNOSIS — F4329 Adjustment disorder with other symptoms: Secondary | ICD-10-CM

## 2016-02-16 DIAGNOSIS — S069X0S Unspecified intracranial injury without loss of consciousness, sequela: Secondary | ICD-10-CM

## 2016-02-16 DIAGNOSIS — F068 Other specified mental disorders due to known physiological condition: Secondary | ICD-10-CM

## 2016-02-16 NOTE — Progress Notes (Addendum)
[facility]  [address]   Telephone [phone] [address] Fax [phone] [place], [place] [address]   Psychology Progress Note   Name:  Albert [name] Date of Birth:  03-08-1976 MRN:  [id number]  Date:02/16/2016 (2m) psychotherapy No new changes or problems reported. He expressed frustration about his persisting fatigability, memory loss and reduced mobility. He is hoping to begin work with a  Physiological scientist after MD approval.    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 I beleive that using this app will make him more efficient and effective as a note taker than writing in a notebook.  Return in three weeks.   Jamey Ripa, Ph.D Licensed Psychologist

## 2016-03-08 ENCOUNTER — Encounter: Payer: Self-pay | Admitting: Psychology

## 2016-03-08 ENCOUNTER — Ambulatory Visit (INDEPENDENT_AMBULATORY_CARE_PROVIDER_SITE_OTHER): Payer: 59 | Admitting: Psychology

## 2016-03-08 DIAGNOSIS — F068 Other specified mental disorders due to known physiological condition: Secondary | ICD-10-CM | POA: Diagnosis not present

## 2016-03-08 DIAGNOSIS — R4189 Other symptoms and signs involving cognitive functions and awareness: Secondary | ICD-10-CM

## 2016-03-08 DIAGNOSIS — F4323 Adjustment disorder with mixed anxiety and depressed mood: Secondary | ICD-10-CM

## 2016-03-08 DIAGNOSIS — S069X0S Unspecified intracranial injury without loss of consciousness, sequela: Principal | ICD-10-CM

## 2016-03-08 DIAGNOSIS — F4329 Adjustment disorder with other symptoms: Secondary | ICD-10-CM

## 2016-03-08 NOTE — Progress Notes (Signed)
[facility]  [address]   Telephone [phone] [address] Fax [phone] [place], [place] [address]   Psychology Progress Note   Name:  SHAQUIL MCCALLEN Date of Birth:  Apr 13, 1976 MRN:  [id number]  Date:03/08/2016 (43m) psychotherapy No new problems reported. Mood stable. He has essentially stopped volunteering at [employer]") and the animal shelter (too far from home) but has applied to volunteer at [facility]. He is currently taking care of his two children without difficulty while his wife away on a trip. He reported that he did not use the speech-to-text function on his IPhone to take notes.  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 Encouraged him to use on a trial basis over the next three weeks. Practiced in session. Praised him for having recovered enough from his TBI to be able to independently take care of his children.  Return in three weeks.   Jamey Ripa, Ph.D Licensed Psychologist

## 2016-03-09 ENCOUNTER — Other Ambulatory Visit: Payer: Self-pay | Admitting: *Deleted

## 2016-03-09 MED ORDER — PROPRANOLOL HCL 20 MG PO TABS: 20.0000 mg | ORAL_TABLET | Freq: Two times a day (BID) | ORAL | Status: DC

## 2016-03-13 ENCOUNTER — Telehealth: Payer: Self-pay

## 2016-03-13 DIAGNOSIS — F909 Attention-deficit hyperactivity disorder, unspecified type: Secondary | ICD-10-CM

## 2016-03-13 MED ORDER — METHYLPHENIDATE HCL ER (OSM) 36 MG PO TBCR: 36.0000 mg | EXTENDED_RELEASE_TABLET | ORAL | Status: DC

## 2016-03-13 NOTE — Telephone Encounter (Signed)
Pt needs a refill on Concerta. Please advise.

## 2016-03-13 NOTE — Telephone Encounter (Signed)
Rx's placed up front. Pt aware.

## 2016-03-13 NOTE — Telephone Encounter (Signed)
concerta rx'es written

## 2016-03-21 ENCOUNTER — Other Ambulatory Visit: Payer: Self-pay | Admitting: Physical Medicine & Rehabilitation

## 2016-03-28 ENCOUNTER — Encounter: Payer: Self-pay | Admitting: Psychology

## 2016-03-28 ENCOUNTER — Ambulatory Visit: Payer: 59 | Attending: Physical Medicine & Rehabilitation | Admitting: Psychology

## 2016-03-28 DIAGNOSIS — F4329 Adjustment disorder with other symptoms: Secondary | ICD-10-CM

## 2016-03-28 DIAGNOSIS — S069X0S Unspecified intracranial injury without loss of consciousness, sequela: Secondary | ICD-10-CM

## 2016-03-28 DIAGNOSIS — F4323 Adjustment disorder with mixed anxiety and depressed mood: Secondary | ICD-10-CM

## 2016-03-28 DIAGNOSIS — Z630 Problems in relationship with spouse or partner: Secondary | ICD-10-CM

## 2016-03-28 DIAGNOSIS — F068 Other specified mental disorders due to known physiological condition: Secondary | ICD-10-CM | POA: Diagnosis not present

## 2016-03-28 NOTE — Progress Notes (Addendum)
[facility]  [address]   Telephone [phone] [address] Fax [phone] [place], [place] [address]   Psychology Progress Note   Name:  AULDEN MOREN Date of Birth:  [birth year] MRN:  [id number]  Date:03/28/2016 (52m) psychotherapy No new changes or problems reported. [name] discussed his ongoing frustrations with his marriage and family life. He is working towards getting approved to Psychologist, occupational at [facility]. He has not been using the voice-to-text function on his cell phone to take notes as he finds it too frustrating.   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 He hopes to take a trip to [place] with a buddy later this summer to see a baseball game. He plans to discuss the dosing of his psychostimulant with Dr. Naaman Plummer. Memory impairment and fatigue continue to be major limiting factors.   I informed him that I will be leaving my position with [facility] as of 05/26/16. I offered the options of being referred to another provider, continuing treatment with me at my new practice location in [place], [place] (he would like to do this but lacks transportation) or terminating psychological services. Will discuss again at next visit.    Jamey Ripa, Ph.D Licensed Psychologist

## 2016-04-11 ENCOUNTER — Telehealth: Payer: Self-pay

## 2016-04-11 DIAGNOSIS — F909 Attention-deficit hyperactivity disorder, unspecified type: Secondary | ICD-10-CM

## 2016-04-11 MED ORDER — METHYLPHENIDATE HCL ER (OSM) 36 MG PO TBCR: 36.0000 mg | EXTENDED_RELEASE_TABLET | ORAL | 0 refills | Status: DC

## 2016-04-11 MED ORDER — METHYLPHENIDATE HCL ER (OSM) 36 MG PO TBCR: 36.0000 mg | EXTENDED_RELEASE_TABLET | Freq: Every day | ORAL | 0 refills | Status: DC

## 2016-04-11 NOTE — Telephone Encounter (Signed)
concerta refilled 

## 2016-04-11 NOTE — Telephone Encounter (Signed)
Pt needs a refill on his methylphenidate. Please advise.

## 2016-04-12 NOTE — Telephone Encounter (Signed)
Rx signed and placed up front. Pt is aware.

## 2016-04-14 ENCOUNTER — Ambulatory Visit: Payer: 59 | Attending: Physical Medicine & Rehabilitation | Admitting: Psychology

## 2016-04-14 DIAGNOSIS — F068 Other specified mental disorders due to known physiological condition: Secondary | ICD-10-CM

## 2016-04-14 DIAGNOSIS — F4329 Adjustment disorder with other symptoms: Secondary | ICD-10-CM

## 2016-04-14 DIAGNOSIS — S069X0S Unspecified intracranial injury without loss of consciousness, sequela: Secondary | ICD-10-CM | POA: Diagnosis not present

## 2016-04-14 DIAGNOSIS — F4323 Adjustment disorder with mixed anxiety and depressed mood: Secondary | ICD-10-CM

## 2016-04-14 NOTE — Progress Notes (Addendum)
[facility]  [address]   Telephone [phone] [address] Fax [phone] [place], [place] [address]   Psychology Progress Note   Name:  Albert [name] Date of Birth:  [birth year] MRN:  [id number]  Date:04/14/2016 (20m) psychotherapy No changes or new problems reported. He continues to feel aggrieved by his sense of being disrespected and ignored by his family. He and his wife have a pending appointment for marital counseling   He stated that continuing services with me at my new location in [place], [place] will be cost prohibitive as he has to pay for transportation. He expressed a desire to be referred to another provider.  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     I will attempt to find him a local counselor for referral.   Return on 05/04/16. Jamey Ripa, Ph.D Licensed Psychologist

## 2016-05-03 ENCOUNTER — Telehealth: Payer: Self-pay | Admitting: Physical Medicine & Rehabilitation

## 2016-05-03 ENCOUNTER — Other Ambulatory Visit: Payer: Self-pay | Admitting: Physical Medicine & Rehabilitation

## 2016-05-03 NOTE — Telephone Encounter (Signed)
Patient is needing a refill on Keppra

## 2016-05-04 ENCOUNTER — Ambulatory Visit (INDEPENDENT_AMBULATORY_CARE_PROVIDER_SITE_OTHER): Payer: 59 | Admitting: Psychology

## 2016-05-04 ENCOUNTER — Encounter: Payer: Self-pay | Admitting: Psychology

## 2016-05-04 DIAGNOSIS — S069X0S Unspecified intracranial injury without loss of consciousness, sequela: Secondary | ICD-10-CM

## 2016-05-04 DIAGNOSIS — F068 Other specified mental disorders due to known physiological condition: Secondary | ICD-10-CM

## 2016-05-04 DIAGNOSIS — R4189 Other symptoms and signs involving cognitive functions and awareness: Secondary | ICD-10-CM

## 2016-05-04 DIAGNOSIS — F4323 Adjustment disorder with mixed anxiety and depressed mood: Secondary | ICD-10-CM

## 2016-05-04 DIAGNOSIS — F4329 Adjustment disorder with other symptoms: Secondary | ICD-10-CM

## 2016-05-04 MED ORDER — LEVETIRACETAM 500 MG PO TABS: 500.0000 mg | ORAL_TABLET | Freq: Two times a day (BID) | ORAL | 0 refills | Status: DC

## 2016-05-04 MED ORDER — LEVETIRACETAM 500 MG PO TABS: 500.0000 mg | ORAL_TABLET | Freq: Two times a day (BID) | ORAL | 4 refills | Status: DC

## 2016-05-04 NOTE — Progress Notes (Signed)
[facility]  [address]   Telephone [phone] [address] Fax [phone] [place], [place] [address]   Psychology Progress Note   Name:  Albert [name] Date of Birth:  03/20/1976 MRN:  [id number]  Date:05/04/2016 (35m) psychotherapy Final session due to my job change. Reviewed his progress over the past three years of our association and his ongoing challenges.  Albert [name]'s major barriers continue to be impairment of memory and executive function, fatigue, visual deficts and reduced social cognition. He has expressed ongoing frustration in reaction to the behavior of his wife and eldest child, which in part reflects his perseveration on these issues. He has not and does not display or report any serious psychopathological symptoms. As usual he did not report major depressive symptoms, undue anxiety. behavioral dyscontrol, suicidal/homicidal ideation, asocial behavior or unsafe behavior.   Diagnostic Impressions Adjustment disorder with mixed emotional features [F43.23] Cognitive deficit as late effect of TBI [F06.8]  Albert [name] had stated his desire to continue receiving counseling services. I gave him the contact information of a colleague, Albert [name], Psy.D, a psychologist with [place]. I had spoken with Dr. [name] about [place] last week and she expressed willingness to work with him.     Jamey Ripa, Ph.D Licensed Psychologist

## 2016-05-17 ENCOUNTER — Encounter: Payer: Self-pay | Admitting: Physical Medicine & Rehabilitation

## 2016-05-17 ENCOUNTER — Encounter
Payer: BLUE CROSS/BLUE SHIELD | Attending: Physical Medicine & Rehabilitation | Admitting: Physical Medicine & Rehabilitation

## 2016-05-17 VITALS — BP 127/83 | HR 63 | Resp 14

## 2016-05-17 DIAGNOSIS — Z87820 Personal history of traumatic brain injury: Secondary | ICD-10-CM | POA: Insufficient documentation

## 2016-05-17 DIAGNOSIS — G44309 Post-traumatic headache, unspecified, not intractable: Secondary | ICD-10-CM | POA: Insufficient documentation

## 2016-05-17 DIAGNOSIS — F900 Attention-deficit hyperactivity disorder, predominantly inattentive type: Secondary | ICD-10-CM | POA: Diagnosis not present

## 2016-05-17 DIAGNOSIS — R561 Post traumatic seizures: Secondary | ICD-10-CM | POA: Insufficient documentation

## 2016-05-17 DIAGNOSIS — F909 Attention-deficit hyperactivity disorder, unspecified type: Secondary | ICD-10-CM

## 2016-05-17 DIAGNOSIS — S066X1S Traumatic subarachnoid hemorrhage with loss of consciousness of 30 minutes or less, sequela: Secondary | ICD-10-CM | POA: Diagnosis not present

## 2016-05-17 DIAGNOSIS — F068 Other specified mental disorders due to known physiological condition: Secondary | ICD-10-CM | POA: Diagnosis not present

## 2016-05-17 DIAGNOSIS — Z09 Encounter for follow-up examination after completed treatment for conditions other than malignant neoplasm: Secondary | ICD-10-CM | POA: Diagnosis not present

## 2016-05-17 DIAGNOSIS — N5089 Other specified disorders of the male genital organs: Secondary | ICD-10-CM | POA: Insufficient documentation

## 2016-05-17 DIAGNOSIS — Z87828 Personal history of other (healed) physical injury and trauma: Secondary | ICD-10-CM | POA: Diagnosis not present

## 2016-05-17 DIAGNOSIS — N5082 Scrotal pain: Secondary | ICD-10-CM | POA: Diagnosis not present

## 2016-05-17 MED ORDER — METHYLPHENIDATE HCL ER (OSM) 36 MG PO TBCR: 36.0000 mg | EXTENDED_RELEASE_TABLET | Freq: Every day | ORAL | 0 refills | Status: DC

## 2016-05-17 MED ORDER — METHYLPHENIDATE HCL ER (OSM) 36 MG PO TBCR: 36.0000 mg | EXTENDED_RELEASE_TABLET | ORAL | 0 refills | Status: DC

## 2016-05-17 NOTE — Progress Notes (Signed)
Subjective:    Patient ID: Albert [name], male    DOB: 04/01/1976, 40 y.o.   MRN: [id number]  HPI   Albert [name] is here in follow up of his TBI. He has started volunteering at [facility] which he will do each Tuesday. Otherwise things are going well. Emotionally he's in a good place. He tried taking more of the exelon and it made him nauseas and he's not sure if it helped his memory regardless.   He finds it difficult to fall asleep at night and wonders if it's the concerta which we moved back to around 0800. He also reports premature ejaculation    Pain Inventory Average Pain 0 Pain Right Now 0 My pain is intermittent and sharp  In the last 24 hours, has pain interfered with the following? General activity 0 Relation with others 0 Enjoyment of life 0 What TIME of day is your pain at its worst? daytime Sleep (in general) NA  Pain is worse with: some activites Pain improves with: rest Relief from Meds: 6  Mobility walk without assistance how many minutes can you walk? 60 ability to climb steps?  yes do you drive?  no  Function disabled: date disabled 12/14/12  Neuro/Psych No problems in this area  Prior Studies Any changes since last visit?  no  Physicians involved in your care Any changes since last visit?  no   Family History  Problem Relation Age of Onset  . Heart disease Father    Social History   Social History  . Marital status: Married    Spouse name: N/A  . Number of children: N/A  . Years of education: N/A   Social History Main Topics  . Smoking status: Former Smoker    Types: Cigarettes    Quit date: 12/17/1999  . Smokeless tobacco: Never Used  . Alcohol use 0.0 oz/week  . Drug use: No  . Sexual activity: Not Asked   Other Topics Concern  . None   Social History Narrative  . None   Past Surgical History:  Procedure Laterality Date  . CRANIOPLASTY N/A 06/10/2013   Procedure: CRANIOPLASTY;  Surgeon: Floyce Stakes, MD;   Location: [facility];  Service: Neurosurgery;  Laterality: N/A;  CRANIOPLASTY  . CRANIOTOMY Left 12/15/2012   Procedure: CRANIECTOMY HEMATOMA EVACUATION SUBDURAL WITH PLACEMENT OF BONE FLAP IN ABDOMINAL WALL;  Surgeon: Floyce Stakes, MD;  Location: [place] [facility];  Service: Neurosurgery;  Laterality: Left;  . CRANIOTOMY Left 12/28/2012   Procedure: CRANIOTOMY HEMATOMA EVACUATION SUBDURAL;  Surgeon: Erline Levine, MD;  Location: [place] [facility]UR[facility];  Service: Neurosurgery;  Laterality: Left;  . PEG PLACEMENT N/A 12/31/2012   Procedure: PERCUTANEOUS ENDOSCOPIC GASTROSTOMY (PEG) PLACEMENT;  Surgeon: Zenovia Jarred, MD;  Location: [place];  Service: General;  Laterality: N/A;  bedside  peg  . PERCUTANEOUS TRACHEOSTOMY N/A 12/31/2012   Procedure: PERCUTANEOUS TRACHEOSTOMY (BEDSIDE);  Surgeon: Zenovia Jarred, MD;  Location: [place];  Service: General;  Laterality: N/A;  . WRIST SURGERY Right 2008   Past Medical History:  Diagnosis Date  . Headache(784.0)   . Rotator cuff injury    right  . Seizures ([place])    12/28/2012  . Stroke [facility])    some aphasia , weakness in the R arm , Out pt. rehab currently   . Swallowing problem    still relearning controlled swallowing - rec'ing speech therapy in rehab  . Traumatic brain injury ([place])    BP 127/83  Pulse 63   Resp 14   SpO2 95%   Opioid Risk Score:   Fall Risk Score:  `1  Depression screen PHQ 2/9  Depression screen [facility] 2/9 05/17/2016 04/30/2013  Decreased Interest 0 0  Down, Depressed, Hopeless 0 0  PHQ - 2 Score 0 0  Some recent data might be hidden    Review of Systems  Constitutional: Positive for unexpected weight change.  All other systems reviewed and are negative.      Objective:   Physical Exam  General: Alert and oriented x 3, No apparent distress. Wearing helmet.  HEENT: Head is normocephalic, atraumatic, PERRLA, EOMI, sclera anicteric, oral mucosa pink and moist, dentition intact, ext ear canals clear,  Neck: Supple  without JVD or lymphadenopathy  Heart: Reg rate and rhythm. No murmurs rubs or gallops  Chest: CTA bilaterally without wheezes, rales, or rhonchi; no distress  Abdomen: Soft, non-tender, non-distended, bowel sounds positive.  Extremities: No clubbing, cyanosis, or edema. Pulses are 2+  Skin: Clean and intact without signs of breakdown  Neuro: Pt recalled the month, year. Initiates conversations. Asked pertinent questions. Attention better. Affect very Dynamic. Improved insight and awareness. Non-agitated. Right HH, no central deficits appreciated. gait normal.  Musculoskeletal: Right shoulder with minimal tenderness  Psych: Pt's affect is more dynamic and pleasant. Pt is cooperative GU: scrotum not visualized    Assessment:  1. 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 Status post craniotomy evacuation of hematoma. He has made substantial progress but still has significant cognitive deficits which affect memory, attention, energy levels, focus, higher level cognition.  2. Old right shoulder injury, likely RTC tendonitis--improved  3. Seizure post- traumatic  4. Scrotal swelling, pain----?dermatitis  5. Post-traumatic headaches---resolved    Plan:  1. Continue low dose propranolol for headache and mood--effective---continue at current dosing 2. Stop exelon due to lack of perceived effect.  3. Concerta for attention and memory--will go ahead and take earlier in the morning so as to avoid residual effects at bed time.  Second rx for next month. May pick up rxes for months 3 and 4 after two months.  4. Continue keppra 500mg  bid 5. Continue vocational reentry--volunteering  6. Unable to return to driving to right HH.  7. Follow up with me in 4 months. 30 minutes of face to face patient care time was spent during this visit. He remains pre-vocational. He may pick up his concerta in 2 months.

## 2016-05-17 NOTE — Patient Instructions (Signed)
PLEASE CALL ME WITH ANY PROBLEMS OR QUESTIONS ([phone])  

## 2016-06-06 ENCOUNTER — Telehealth: Payer: Self-pay | Admitting: Physical Medicine & Rehabilitation

## 2016-06-06 NOTE — Telephone Encounter (Signed)
Patient needs a refill on Concerta and Keppra.

## 2016-06-06 NOTE — Telephone Encounter (Signed)
According to note he was given RX at 05/17/16 appointment and one for October and then he can call for Nov and December refills.  Keppra has refills at pharmacy.  I have spoken with Albert [name].

## 2016-06-14 ENCOUNTER — Ambulatory Visit: Payer: BLUE CROSS/BLUE SHIELD | Admitting: Psychiatry

## 2016-06-22 ENCOUNTER — Telehealth: Payer: Self-pay | Admitting: Physical Medicine & Rehabilitation

## 2016-06-22 NOTE — Telephone Encounter (Signed)
Patient's wife is calling us back to let us know that need a new prescription for Exelon, they threw it away since they weren't going to use it anymore.  She has noticed a difference since the patient has been off of this medication.  Please call her at [phone].

## 2016-06-22 NOTE — Telephone Encounter (Signed)
Please advise 

## 2016-06-22 NOTE — Telephone Encounter (Signed)
wife has called noticing some changes in the patient since SWARTZ took him off EXELON. Wants patient to be back on the medicationa and the medication to be put back on the patients medication list.  If there are any questions, please call patient or wife.

## 2016-06-23 MED ORDER — RIVASTIGMINE TARTRATE 3 MG PO CAPS: 3.0000 mg | ORAL_CAPSULE | Freq: Two times a day (BID) | ORAL | 5 refills | Status: DC

## 2016-06-23 NOTE — Telephone Encounter (Signed)
This has been sent to Dr Naaman Plummer by Yvone Neu

## 2016-06-23 NOTE — Telephone Encounter (Signed)
Order written

## 2016-06-23 NOTE — Telephone Encounter (Signed)
Wife notified order sent to pharmacy.

## 2016-07-04 ENCOUNTER — Ambulatory Visit (INDEPENDENT_AMBULATORY_CARE_PROVIDER_SITE_OTHER): Payer: BLUE CROSS/BLUE SHIELD | Admitting: Psychiatry

## 2016-07-04 DIAGNOSIS — F331 Major depressive disorder, recurrent, moderate: Secondary | ICD-10-CM | POA: Diagnosis not present

## 2016-07-06 ENCOUNTER — Telehealth: Payer: Self-pay | Admitting: *Deleted

## 2016-07-06 NOTE — Telephone Encounter (Signed)
Needs to know why he is taking Concerta. Another physcian seeing him needs this information. Notified Albert [name] that he is taking "Concerta for attention and memory"

## 2016-07-11 ENCOUNTER — Telehealth: Payer: Self-pay | Admitting: Physical Medicine & Rehabilitation

## 2016-07-11 DIAGNOSIS — S066X1S Traumatic subarachnoid hemorrhage with loss of consciousness of 30 minutes or less, sequela: Secondary | ICD-10-CM

## 2016-07-11 DIAGNOSIS — Z6830 Body mass index (BMI) 30.0-30.9, adult: Secondary | ICD-10-CM | POA: Diagnosis not present

## 2016-07-11 DIAGNOSIS — T148XXA Other injury of unspecified body region, initial encounter: Secondary | ICD-10-CM | POA: Diagnosis not present

## 2016-07-11 DIAGNOSIS — M25511 Pain in right shoulder: Secondary | ICD-10-CM | POA: Diagnosis not present

## 2016-07-11 MED ORDER — METHYLPHENIDATE HCL ER (OSM) 36 MG PO TBCR: 36.0000 mg | EXTENDED_RELEASE_TABLET | Freq: Every day | ORAL | 0 refills | Status: DC

## 2016-07-11 NOTE — Telephone Encounter (Signed)
Patient needs a refill on Concerta.  Please call patient at [phone].

## 2016-07-11 NOTE — Telephone Encounter (Signed)
Rxs for Concerta (2 months) printed for Naaman Plummer to sign.

## 2016-07-12 NOTE — Telephone Encounter (Signed)
Message left for Mr Klema notifying of prescriptions ready for pick up

## 2016-07-20 ENCOUNTER — Telehealth: Payer: Self-pay | Admitting: Physical Medicine & Rehabilitation

## 2016-07-20 ENCOUNTER — Ambulatory Visit (INDEPENDENT_AMBULATORY_CARE_PROVIDER_SITE_OTHER): Payer: Medicare Other | Admitting: Psychiatry

## 2016-07-20 DIAGNOSIS — F422 Mixed obsessional thoughts and acts: Secondary | ICD-10-CM | POA: Diagnosis not present

## 2016-07-20 NOTE — Telephone Encounter (Signed)
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  He is needing an okay for him to do so.  Please call patient.

## 2016-07-21 NOTE — Telephone Encounter (Signed)
I think that would be GREAT!!! He has my full support

## 2016-07-23 ENCOUNTER — Other Ambulatory Visit: Payer: Self-pay | Admitting: Physical Medicine & Rehabilitation

## 2016-08-10 ENCOUNTER — Ambulatory Visit: Payer: BLUE CROSS/BLUE SHIELD | Admitting: Psychiatry

## 2016-08-11 ENCOUNTER — Other Ambulatory Visit: Payer: Self-pay | Admitting: *Deleted

## 2016-08-11 ENCOUNTER — Other Ambulatory Visit: Payer: Self-pay

## 2016-08-11 MED ORDER — LEVETIRACETAM 500 MG PO TABS: 500.0000 mg | ORAL_TABLET | Freq: Two times a day (BID) | ORAL | 4 refills | Status: DC

## 2016-08-11 MED ORDER — PROPRANOLOL HCL 20 MG PO TABS: 20.0000 mg | ORAL_TABLET | Freq: Two times a day (BID) | ORAL | 5 refills | Status: DC

## 2016-08-17 ENCOUNTER — Ambulatory Visit: Payer: Self-pay | Admitting: Psychiatry

## 2016-08-30 DIAGNOSIS — H5111 Convergence insufficiency: Secondary | ICD-10-CM | POA: Diagnosis not present

## 2016-08-30 DIAGNOSIS — H5330 Unspecified disorder of binocular vision: Secondary | ICD-10-CM | POA: Diagnosis not present

## 2016-08-30 DIAGNOSIS — Z0100 Encounter for examination of eyes and vision without abnormal findings: Secondary | ICD-10-CM | POA: Diagnosis not present

## 2016-08-30 DIAGNOSIS — H539 Unspecified visual disturbance: Secondary | ICD-10-CM | POA: Diagnosis not present

## 2016-08-30 DIAGNOSIS — H5347 Heteronymous bilateral field defects: Secondary | ICD-10-CM | POA: Diagnosis not present

## 2016-08-31 ENCOUNTER — Ambulatory Visit (INDEPENDENT_AMBULATORY_CARE_PROVIDER_SITE_OTHER): Payer: Medicare Other | Admitting: Psychiatry

## 2016-08-31 DIAGNOSIS — F4321 Adjustment disorder with depressed mood: Secondary | ICD-10-CM

## 2016-09-13 ENCOUNTER — Ambulatory Visit (INDEPENDENT_AMBULATORY_CARE_PROVIDER_SITE_OTHER): Payer: BLUE CROSS/BLUE SHIELD | Admitting: Psychiatry

## 2016-09-13 DIAGNOSIS — Z6830 Body mass index (BMI) 30.0-30.9, adult: Secondary | ICD-10-CM | POA: Diagnosis not present

## 2016-09-13 DIAGNOSIS — J329 Chronic sinusitis, unspecified: Secondary | ICD-10-CM | POA: Diagnosis not present

## 2016-09-13 DIAGNOSIS — F4321 Adjustment disorder with depressed mood: Secondary | ICD-10-CM | POA: Diagnosis not present

## 2016-09-18 ENCOUNTER — Ambulatory Visit: Payer: BLUE CROSS/BLUE SHIELD | Admitting: Physical Medicine & Rehabilitation

## 2016-09-18 ENCOUNTER — Encounter: Payer: Self-pay | Admitting: Physical Medicine & Rehabilitation

## 2016-09-18 ENCOUNTER — Encounter
Payer: BLUE CROSS/BLUE SHIELD | Attending: Physical Medicine & Rehabilitation | Admitting: Physical Medicine & Rehabilitation

## 2016-09-18 VITALS — BP 127/87 | HR 70 | Resp 14

## 2016-09-18 DIAGNOSIS — S066X9D Traumatic subarachnoid hemorrhage with loss of consciousness of unspecified duration, subsequent encounter: Secondary | ICD-10-CM | POA: Diagnosis not present

## 2016-09-18 DIAGNOSIS — S069X0S Unspecified intracranial injury without loss of consciousness, sequela: Secondary | ICD-10-CM

## 2016-09-18 DIAGNOSIS — Z9889 Other specified postprocedural states: Secondary | ICD-10-CM | POA: Diagnosis not present

## 2016-09-18 DIAGNOSIS — R4189 Other symptoms and signs involving cognitive functions and awareness: Secondary | ICD-10-CM | POA: Diagnosis not present

## 2016-09-18 DIAGNOSIS — S066X1S Traumatic subarachnoid hemorrhage with loss of consciousness of 30 minutes or less, sequela: Secondary | ICD-10-CM

## 2016-09-18 DIAGNOSIS — Z87891 Personal history of nicotine dependence: Secondary | ICD-10-CM | POA: Insufficient documentation

## 2016-09-18 DIAGNOSIS — F068 Other specified mental disorders due to known physiological condition: Secondary | ICD-10-CM

## 2016-09-18 DIAGNOSIS — R569 Unspecified convulsions: Secondary | ICD-10-CM | POA: Diagnosis not present

## 2016-09-18 DIAGNOSIS — Z8249 Family history of ischemic heart disease and other diseases of the circulatory system: Secondary | ICD-10-CM | POA: Diagnosis not present

## 2016-09-18 DIAGNOSIS — Z87820 Personal history of traumatic brain injury: Secondary | ICD-10-CM | POA: Insufficient documentation

## 2016-09-18 DIAGNOSIS — R51 Headache: Secondary | ICD-10-CM | POA: Insufficient documentation

## 2016-09-18 MED ORDER — RIVASTIGMINE TARTRATE 3 MG PO CAPS: 3.0000 mg | ORAL_CAPSULE | Freq: Two times a day (BID) | ORAL | 5 refills | Status: DC

## 2016-09-18 MED ORDER — METHYLPHENIDATE HCL ER (OSM) 36 MG PO TBCR: 36.0000 mg | EXTENDED_RELEASE_TABLET | Freq: Every day | ORAL | 0 refills | Status: DC

## 2016-09-18 NOTE — Progress Notes (Signed)
Subjective:    Patient ID: Albert [name], male    DOB: 1976-02-11, 41 y.o.   MRN: [id number]  HPI   Albert [name] is here in follow up of his TBI. He came down with a URI and had to back off his volunteering a couple weeks ago. He is supposed to go back tomorrow. It had been going fairly well although he wants to do more than he's doing. He is assisting with physical therapy at the moment and has been limited to only an hour or so here and there thus far. He remains busy at home and helps manage the house and take care of his daughter. He discussed several pertinent issues regarding her today during the visit.   We had stopped the exelon this Fall as he felt it wasn't helping. After coming off the medication, he noticed it did make a difference. He has been on 3mg  BID since. He hasn't had any negative side effects. He also continues on concerta as rxed.    Pain Inventory Average Pain 0 Pain Right Now 0 My pain is sharp  In the last 24 hours, has pain interfered with the following? General activity 0 Relation with others 0 Enjoyment of life 0 What TIME of day is your pain at its worst? morning Sleep (in general) Fair  Pain is worse with: standing Pain improves with: rest Relief from Meds: 6  Mobility walk without assistance how many minutes can you walk? 45 ability to climb steps?  yes do you drive?  no Do you have any goals in this area?  yes  Function disabled: date disabled 12/15/12 Do you have any goals in this area?  yes  Neuro/Psych No problems in this area  Prior Studies Any changes since last visit?  no  Physicians involved in your care Any changes since last visit?  no   Family History  Problem Relation Age of Onset  . Heart disease Father    Social History   Social History  . Marital status: Married    Spouse name: N/A  . Number of children: N/A  . Years of education: N/A   Social History Main Topics  . Smoking status: Former Smoker    Types:  Cigarettes    Quit date: 12/17/1999  . Smokeless tobacco: Never Used  . Alcohol use 0.0 oz/week  . Drug use: No  . Sexual activity: Not Asked   Other Topics Concern  . None   Social History Narrative  . None   Past Surgical History:  Procedure Laterality Date  . CRANIOPLASTY N/A 06/10/2013   Procedure: CRANIOPLASTY;  Surgeon: Floyce Stakes, MD;  Location: [facility];  Service: Neurosurgery;  Laterality: N/A;  CRANIOPLASTY  . CRANIOTOMY Left 12/15/2012   Procedure: CRANIECTOMY HEMATOMA EVACUATION SUBDURAL WITH PLACEMENT OF BONE FLAP IN ABDOMINAL WALL;  Surgeon: Floyce Stakes, MD;  Location: [place] [facility]UR[facility];  Service: Neurosurgery;  Laterality: Left;  . CRANIOTOMY Left 12/28/2012   Procedure: CRANIOTOMY HEMATOMA EVACUATION SUBDURAL;  Surgeon: Erline Levine, MD;  Location: [place] NEUR[facility] OR[facility];  Service: Neurosurgery;  Laterality: Left;  . PEG PLACEMENT N/A 12/31/2012   Procedure: PERCUTANEOUS ENDOSCOPIC GASTROSTOMY (PEG) PLACEMENT;  Surgeon: Zenovia Jarred, MD;  Location: [place];  Service: General;  Laterality: N/A;  bedside  peg  . PERCUTANEOUS TRACHEOSTOMY N/A 12/31/2012   Procedure: PERCUTANEOUS TRACHEOSTOMY (BEDSIDE);  Surgeon: Zenovia Jarred, MD;  Location: [place];  Service: General;  Laterality: N/A;  . WRIST SURGERY Right 2008  Past Medical History:  Diagnosis Date  . Headache(784.0)   . Rotator cuff injury    right  . Seizures ([place])    12/28/2012  . Stroke [facility])    some aphasia , weakness in the R arm , Out pt. rehab currently   . Swallowing problem    still relearning controlled swallowing - rec'ing speech therapy in rehab  . Traumatic brain injury ([place])    BP 127/87 (BP Location: Left Arm, Patient Position: Sitting, Cuff Size: Large)   Pulse 70   Resp 14   SpO2 96%   Opioid Risk Score:   Fall Risk Score:  `1  Depression screen PHQ 2/9  Depression screen [facility] 2/9 05/17/2016 04/30/2013  Decreased Interest 0 0  Down, Depressed, Hopeless 0 0  PHQ - 2 Score 0 0  Some  recent data might be hidden     Review of Systems  Constitutional: Positive for unexpected weight change.       Night sweats   HENT: Negative.   Eyes: Negative.   Respiratory: Negative.   Cardiovascular: Negative.   Gastrointestinal: Negative.   Endocrine: Negative.   Genitourinary: Negative.   Musculoskeletal: Negative.   Skin: Negative.   Allergic/Immunologic: Negative.   Neurological: Negative.   Hematological: Negative.   Psychiatric/Behavioral: Negative.   All other systems reviewed and are negative.      Objective:   Physical Exam  General: Alert and oriented x 3, No apparent distress. Wearing helmet.  HEENT:Head is normocephalic, atraumatic, PERRLA, EOMI, sclera anicteric, oral mucosa pink and moist, dentition intact, ext ear canals clear,  Neck:Supple without JVD or lymphadenopathy  Heart:RRR   Chest:CTA bilaterally  Abdomen:Soft, non-tender, non-distended, bowel sounds positive.  Extremities:No clubbing, cyanosis, or edema. Pulses are 2+  Skin:Clean and intact without signs of breakdown  Neuro:memory improving as is insight. He uses his phone to augment his memory.  Affect  Dynamic. I  Right HH, no central deficits appreciated. gait normal.  Musculoskeletal:Right shoulder with minimal tenderness  Psych:Pt's affect is more dynamic and pleasant. Pt is cooperative GU: scrotum not visualized    Assessment:  1. 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 Status post craniotomy evacuation of hematoma. He has made substantial progress but still has significant cognitive deficits which affect memory, attention, energy levels, focus, higher level cognition.  2. Old right shoulder injury, likely RTC tendonitis--improved  3. Seizure post- traumatic  4. Scrotal swelling, pain----resolved 5. Post-traumatic headaches---resolved    Plan:  1. Continue low dose propranolol for headache and mood--effective---continue at  current dosing 2. Continue exelon but try to increase to 6mg  to boost memory further . 3. Concerta for attention and memory--will go ahead and take earlier in the morning so as to avoid residual effects at bed time.  Second rx for next month. May pick up rxes for months 3 and 4 after two months.  4. Continue keppra 500mg  bid 5. Continue vocational reentry--volunteering is great. i'll see if we can help him a bit more.  6. Unable to return to driving to right HH.  7. Follow up with me in 4 months. 30 minutes of face to face patient care time was spent during this visit. He remains pre-vocational. He may pick up his concerta in 2 months.

## 2016-09-18 NOTE — Patient Instructions (Signed)
PLEASE CALL ME WITH ANY PROBLEMS OR QUESTIONS ([phone])  

## 2016-09-19 ENCOUNTER — Telehealth: Payer: Self-pay | Admitting: *Deleted

## 2016-09-19 NOTE — Telephone Encounter (Signed)
Cy called to let Dr Naaman Plummer know that the Exelon makes him throw up every time he takes it.

## 2016-09-20 ENCOUNTER — Other Ambulatory Visit: Payer: Self-pay | Admitting: *Deleted

## 2016-09-20 DIAGNOSIS — S066X1S Traumatic subarachnoid hemorrhage with loss of consciousness of 30 minutes or less, sequela: Secondary | ICD-10-CM

## 2016-09-20 DIAGNOSIS — F068 Other specified mental disorders due to known physiological condition: Secondary | ICD-10-CM

## 2016-09-20 DIAGNOSIS — S069X0S Unspecified intracranial injury without loss of consciousness, sequela: Principal | ICD-10-CM

## 2016-09-20 MED ORDER — RIVASTIGMINE TARTRATE 3 MG PO CAPS: 3.0000 mg | ORAL_CAPSULE | Freq: Two times a day (BID) | ORAL | 5 refills | Status: DC

## 2016-09-20 NOTE — Telephone Encounter (Signed)
Spoke with patient, he is tolerating 1 tablet (3mg ) twice a day.  He will continue with that and call us if there are any further complications

## 2016-09-20 NOTE — Telephone Encounter (Signed)
Then he needs to reduce back to the 3mg  as we discussed! (that's why I gave him an rx for the 3mg  caps)

## 2016-09-20 NOTE — Telephone Encounter (Signed)
Spoke with patient, h

## 2016-10-04 ENCOUNTER — Ambulatory Visit (INDEPENDENT_AMBULATORY_CARE_PROVIDER_SITE_OTHER): Payer: Medicare Other | Admitting: Psychiatry

## 2016-10-04 DIAGNOSIS — F4321 Adjustment disorder with depressed mood: Secondary | ICD-10-CM

## 2016-11-02 ENCOUNTER — Ambulatory Visit (INDEPENDENT_AMBULATORY_CARE_PROVIDER_SITE_OTHER): Payer: BLUE CROSS/BLUE SHIELD | Admitting: Psychiatry

## 2016-11-02 DIAGNOSIS — F4321 Adjustment disorder with depressed mood: Secondary | ICD-10-CM | POA: Diagnosis not present

## 2016-11-03 DIAGNOSIS — Z0000 Encounter for general adult medical examination without abnormal findings: Secondary | ICD-10-CM | POA: Diagnosis not present

## 2016-11-03 DIAGNOSIS — Z136 Encounter for screening for cardiovascular disorders: Secondary | ICD-10-CM | POA: Diagnosis not present

## 2016-11-06 DIAGNOSIS — Z0000 Encounter for general adult medical examination without abnormal findings: Secondary | ICD-10-CM | POA: Diagnosis not present

## 2016-11-06 DIAGNOSIS — Z6829 Body mass index (BMI) 29.0-29.9, adult: Secondary | ICD-10-CM | POA: Diagnosis not present

## 2016-11-08 ENCOUNTER — Other Ambulatory Visit: Payer: Self-pay | Admitting: Family Medicine

## 2016-11-08 DIAGNOSIS — R1903 Right lower quadrant abdominal swelling, mass and lump: Secondary | ICD-10-CM

## 2016-11-13 ENCOUNTER — Other Ambulatory Visit: Payer: Self-pay

## 2016-11-13 ENCOUNTER — Ambulatory Visit
Admission: RE | Admit: 2016-11-13 | Discharge: 2016-11-13 | Disposition: A | Discharge status: Home / Self Care | Payer: BLUE CROSS/BLUE SHIELD | Source: Ambulatory Visit | Attending: Family Medicine | Admitting: Family Medicine

## 2016-11-13 DIAGNOSIS — R1031 Right lower quadrant pain: Secondary | ICD-10-CM | POA: Diagnosis not present

## 2016-11-13 DIAGNOSIS — R1903 Right lower quadrant abdominal swelling, mass and lump: Secondary | ICD-10-CM

## 2016-11-28 ENCOUNTER — Ambulatory Visit
Admission: RE | Admit: 2016-11-28 | Discharge: 2016-11-28 | Disposition: A | Discharge status: Home / Self Care | Payer: BLUE CROSS/BLUE SHIELD | Source: Ambulatory Visit | Attending: Family Medicine | Admitting: Family Medicine

## 2016-11-28 ENCOUNTER — Other Ambulatory Visit: Payer: Self-pay | Admitting: Family Medicine

## 2016-11-28 DIAGNOSIS — M79671 Pain in right foot: Secondary | ICD-10-CM | POA: Diagnosis not present

## 2016-11-28 DIAGNOSIS — S99921A Unspecified injury of right foot, initial encounter: Secondary | ICD-10-CM | POA: Diagnosis not present

## 2016-11-28 DIAGNOSIS — M25571 Pain in right ankle and joints of right foot: Secondary | ICD-10-CM | POA: Diagnosis not present

## 2016-11-28 DIAGNOSIS — Z6829 Body mass index (BMI) 29.0-29.9, adult: Secondary | ICD-10-CM | POA: Diagnosis not present

## 2016-11-29 ENCOUNTER — Ambulatory Visit (INDEPENDENT_AMBULATORY_CARE_PROVIDER_SITE_OTHER): Payer: BLUE CROSS/BLUE SHIELD | Admitting: Psychiatry

## 2016-11-29 DIAGNOSIS — F4321 Adjustment disorder with depressed mood: Secondary | ICD-10-CM

## 2016-12-04 ENCOUNTER — Other Ambulatory Visit: Payer: Self-pay

## 2016-12-04 MED ORDER — LEVETIRACETAM 500 MG PO TABS: 500.0000 mg | ORAL_TABLET | Freq: Two times a day (BID) | ORAL | 4 refills | Status: DC

## 2016-12-05 ENCOUNTER — Other Ambulatory Visit: Payer: Self-pay

## 2016-12-05 ENCOUNTER — Telehealth: Payer: Self-pay | Admitting: *Deleted

## 2016-12-05 DIAGNOSIS — S066X1S Traumatic subarachnoid hemorrhage with loss of consciousness of 30 minutes or less, sequela: Secondary | ICD-10-CM

## 2016-12-05 NOTE — Telephone Encounter (Signed)
Patient called and is requesting refills for his concerta, states is leaving country for [place] this weekend and will not be back for 1 week, states is running out of medication either on Friday or Saturday this week. Please advise

## 2016-12-05 NOTE — Telephone Encounter (Signed)
Patient left a message stating that he is ready for his concerta refills. Per Dr. Charm Barges 09/18/2016 clinic note, patient was instructed to call us for next set of Rx's, he is going out of town at the end opf the week.  Please advise

## 2016-12-05 NOTE — Telephone Encounter (Signed)
I will write them tomorrow. Please remind me. thx

## 2016-12-07 ENCOUNTER — Telehealth: Payer: Self-pay

## 2016-12-07 ENCOUNTER — Other Ambulatory Visit: Payer: Self-pay

## 2016-12-07 DIAGNOSIS — S066X1S Traumatic subarachnoid hemorrhage with loss of consciousness of 30 minutes or less, sequela: Secondary | ICD-10-CM

## 2016-12-07 MED ORDER — METHYLPHENIDATE HCL ER (OSM) 36 MG PO TBCR: 36.0000 mg | EXTENDED_RELEASE_TABLET | Freq: Every day | ORAL | 0 refills | Status: DC

## 2016-12-07 MED ORDER — METHYLPHENIDATE HCL ER (OSM) 36 MG PO TBCR
36.0000 mg | EXTENDED_RELEASE_TABLET | Freq: Every day | ORAL | 0 refills | Status: DC
Start: 2016-12-07 — End: 2016-12-07

## 2016-12-07 NOTE — Telephone Encounter (Signed)
error 

## 2016-12-07 NOTE — Telephone Encounter (Signed)
Rx Concerta has ben printed off and patient has been notified.

## 2016-12-28 ENCOUNTER — Ambulatory Visit (INDEPENDENT_AMBULATORY_CARE_PROVIDER_SITE_OTHER): Payer: BLUE CROSS/BLUE SHIELD | Admitting: Psychiatry

## 2016-12-28 DIAGNOSIS — F4321 Adjustment disorder with depressed mood: Secondary | ICD-10-CM | POA: Diagnosis not present

## 2017-01-04 ENCOUNTER — Other Ambulatory Visit: Payer: Self-pay | Admitting: Physical Medicine & Rehabilitation

## 2017-01-04 ENCOUNTER — Other Ambulatory Visit: Payer: Self-pay

## 2017-01-04 DIAGNOSIS — S066X1S Traumatic subarachnoid hemorrhage with loss of consciousness of 30 minutes or less, sequela: Secondary | ICD-10-CM

## 2017-01-04 DIAGNOSIS — S069X0S Unspecified intracranial injury without loss of consciousness, sequela: Principal | ICD-10-CM

## 2017-01-04 DIAGNOSIS — R739 Hyperglycemia, unspecified: Secondary | ICD-10-CM | POA: Diagnosis not present

## 2017-01-04 DIAGNOSIS — M79671 Pain in right foot: Secondary | ICD-10-CM | POA: Diagnosis not present

## 2017-01-04 DIAGNOSIS — F068 Other specified mental disorders due to known physiological condition: Secondary | ICD-10-CM

## 2017-01-04 DIAGNOSIS — I639 Cerebral infarction, unspecified: Secondary | ICD-10-CM | POA: Diagnosis not present

## 2017-01-04 DIAGNOSIS — J309 Allergic rhinitis, unspecified: Secondary | ICD-10-CM | POA: Diagnosis not present

## 2017-01-04 MED ORDER — RIVASTIGMINE TARTRATE 3 MG PO CAPS: 3.0000 mg | ORAL_CAPSULE | Freq: Two times a day (BID) | ORAL | 5 refills | Status: DC

## 2017-01-17 ENCOUNTER — Encounter
Payer: BLUE CROSS/BLUE SHIELD | Attending: Physical Medicine & Rehabilitation | Admitting: Physical Medicine & Rehabilitation

## 2017-01-17 ENCOUNTER — Encounter: Payer: Self-pay | Admitting: Physical Medicine & Rehabilitation

## 2017-01-17 VITALS — BP 115/72 | HR 73 | Resp 14

## 2017-01-17 DIAGNOSIS — R569 Unspecified convulsions: Secondary | ICD-10-CM | POA: Insufficient documentation

## 2017-01-17 DIAGNOSIS — R51 Headache: Secondary | ICD-10-CM | POA: Insufficient documentation

## 2017-01-17 DIAGNOSIS — F39 Unspecified mood [affective] disorder: Secondary | ICD-10-CM | POA: Diagnosis not present

## 2017-01-17 DIAGNOSIS — Z87891 Personal history of nicotine dependence: Secondary | ICD-10-CM | POA: Insufficient documentation

## 2017-01-17 DIAGNOSIS — F0630 Mood disorder due to known physiological condition, unspecified: Secondary | ICD-10-CM

## 2017-01-17 DIAGNOSIS — Z8673 Personal history of transient ischemic attack (TIA), and cerebral infarction without residual deficits: Secondary | ICD-10-CM | POA: Insufficient documentation

## 2017-01-17 DIAGNOSIS — F068 Other specified mental disorders due to known physiological condition: Secondary | ICD-10-CM

## 2017-01-17 DIAGNOSIS — S069X9S Unspecified intracranial injury with loss of consciousness of unspecified duration, sequela: Secondary | ICD-10-CM

## 2017-01-17 DIAGNOSIS — S069X0S Unspecified intracranial injury without loss of consciousness, sequela: Secondary | ICD-10-CM

## 2017-01-17 DIAGNOSIS — S066X9A Traumatic subarachnoid hemorrhage with loss of consciousness of unspecified duration, initial encounter: Secondary | ICD-10-CM | POA: Diagnosis not present

## 2017-01-17 DIAGNOSIS — Z9889 Other specified postprocedural states: Secondary | ICD-10-CM | POA: Insufficient documentation

## 2017-01-17 DIAGNOSIS — Z09 Encounter for follow-up examination after completed treatment for conditions other than malignant neoplasm: Secondary | ICD-10-CM | POA: Diagnosis not present

## 2017-01-17 DIAGNOSIS — S066X1S Traumatic subarachnoid hemorrhage with loss of consciousness of 30 minutes or less, sequela: Secondary | ICD-10-CM | POA: Diagnosis not present

## 2017-01-17 DIAGNOSIS — X58XXXA Exposure to other specified factors, initial encounter: Secondary | ICD-10-CM | POA: Insufficient documentation

## 2017-01-17 MED ORDER — METHYLPHENIDATE HCL ER (OSM) 36 MG PO TBCR: 36.0000 mg | EXTENDED_RELEASE_TABLET | Freq: Every day | ORAL | 0 refills | Status: DC

## 2017-01-17 NOTE — Patient Instructions (Signed)
I DON'T THINK WE NEED TO INCREASE MEDICATIONS FOR IRRITABILITY, HOWEVER IF YOU AND YOUR WIFE FEEL THAT IT'S SOMETHING WHICH NEEDS TO BE ADDRESSED, I THINK THE MOST APPROPRIATE NEXT STEP WOULD BE TO DISCUSS WITH OUR NEUROPSYCHOLOGIST, DR. Sima Matas.   IF YOU WANT, WE CAN TRY REDUCING THE EXELON TO 3MG  AT NIGHT TIME ONLY. IF YOU SEEN NO DIFFERENCE IN MEMORY AFTER TWO WEEKS THEN YOU COULD STOP. IF YOU FEEL LIKE IT'S MAKING A DEFINITE DIFFERENCE, YOU CAN CONTINUE IT INDEFINITELY.

## 2017-01-17 NOTE — Progress Notes (Signed)
Subjective:    Patient ID: Albert [name], male    DOB: May 16, 1976, 41 y.o.   MRN: [id number]  HPI   Albert [name] is here in follow up of his TBi. He has continued to volunteer with the hospital. He likes that quite a bit. He also has started up with tennis lessons. He has found it challenging from a balance and reaction time standpoint.   His wife states that he is irritable around the children. Albert [name] doesn't feel that he is and that it's just a different style of parenting. (Mom is more passive). He feels that his wife tends to facilitate some of the kids behavior at times.   His pain is minimal at this point. He is rarely having headaches.      Pain Inventory Average Pain 0 Pain Right Now 0 My pain is intermittent and sharp  In the last 24 hours, has pain interfered with the following? General activity 0 Relation with others 0 Enjoyment of life 0 What TIME of day is your pain at its worst? evening Sleep (in general) Fair  Pain is worse with: standing Pain improves with: rest and pacing activities Relief from Meds: no selection  Mobility walk without assistance how many minutes can you walk? 45 ability to climb steps?  yes do you drive?  no Do you have any goals in this area?  yes  Function disabled: date disabled .  Neuro/Psych No problems in this area  Prior Studies Any changes since last visit?  no  Physicians involved in your care Any changes since last visit?  no   Family History  Problem Relation Age of Onset  . Heart disease Father    Social History   Social History  . Marital status: Married    Spouse name: N/A  . Number of children: N/A  . Years of education: N/A   Social History Main Topics  . Smoking status: Former Smoker    Types: Cigarettes    Quit date: 12/17/1999  . Smokeless tobacco: Never Used  . Alcohol use 0.0 oz/week  . Drug use: No  . Sexual activity: Not Asked   Other Topics Concern  . None   Social History Narrative  .  None   Past Surgical History:  Procedure Laterality Date  . CRANIOPLASTY N/A 06/10/2013   Procedure: CRANIOPLASTY;  Surgeon: Floyce Stakes, MD;  Location: [facility];  Service: Neurosurgery;  Laterality: N/A;  CRANIOPLASTY  . CRANIOTOMY Left 12/15/2012   Procedure: CRANIECTOMY HEMATOMA EVACUATION SUBDURAL WITH PLACEMENT OF BONE FLAP IN ABDOMINAL WALL;  Surgeon: Floyce Stakes, MD;  Location: [place] [facility];  Service: Neurosurgery;  Laterality: Left;  . CRANIOTOMY Left 12/28/2012   Procedure: CRANIOTOMY HEMATOMA EVACUATION SUBDURAL;  Surgeon: Erline Levine, MD;  Location: [place] [facility];  Service: Neurosurgery;  Laterality: Left;  . PEG PLACEMENT N/A 12/31/2012   Procedure: PERCUTANEOUS ENDOSCOPIC GASTROSTOMY (PEG) PLACEMENT;  Surgeon: Zenovia Jarred, MD;  Location: [place];  Service: General;  Laterality: N/A;  bedside  peg  . PERCUTANEOUS TRACHEOSTOMY N/A 12/31/2012   Procedure: PERCUTANEOUS TRACHEOSTOMY (BEDSIDE);  Surgeon: Zenovia Jarred, MD;  Location: [place];  Service: General;  Laterality: N/A;  . WRIST SURGERY Right 2008   Past Medical History:  Diagnosis Date  . Headache(784.0)   . Rotator cuff injury    right  . Seizures ([place])    12/28/2012  . Stroke [facility])    some aphasia , weakness in the R arm , Out  pt. rehab currently   . Swallowing problem    still relearning controlled swallowing - rec'ing speech therapy in rehab  . Traumatic brain injury ([place])    BP 115/72 (BP Location: Right Arm, Patient Position: Sitting, Cuff Size: Normal)   Pulse 73   Resp 14   SpO2 96%   Opioid Risk Score:   Fall Risk Score:  `1  Depression screen PHQ 2/9  Depression screen [facility] 2/9 05/17/2016 04/30/2013  Decreased Interest 0 0  Down, Depressed, Hopeless 0 0  PHQ - 2 Score 0 0  Some recent data might be hidden    Review of Systems  Constitutional: Positive for unexpected weight change.  HENT: Negative.   Eyes: Negative.   Respiratory: Negative.   Cardiovascular: Negative.     Gastrointestinal: Negative.   Endocrine: Negative.   Genitourinary: Negative.   Musculoskeletal: Negative.   Skin: Negative.   Allergic/Immunologic: Negative.   Neurological: Negative.   Hematological: Negative.   Psychiatric/Behavioral: Negative.   All other systems reviewed and are negative.      Objective:   Physical Exam  General: Alert and oriented x 3, No apparent distress. Wearing helmet.  HEENT:Head is normocephalic, atraumatic, PERRLA, EOMI, sclera anicteric, oral mucosa pink and moist, dentition intact, ext ear canals clear,  Neck:Supple without JVD or lymphadenopathy  Heart:RRR   Chest:CTA B Abdomen:Soft, non-tender, non-distended, bowel sounds positive.  Extremities:No clubbing, cyanosis, or edema. Pulses are 2+  Skin:Clean and intact without signs of breakdown  Neuro:memory better as is attention. Keeps his thoughts together much more. much improved insight Musculoskeletal:Right shoulder with minimal tenderness  Psych:Pt's affect is  dynamic and pleasant. Pt is cooperative. No irritability seen     Assessment:  1. 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 Status post craniotomy evacuation of hematoma. He has made substantial progress but still has significant cognitive deficits which affect memory, attention, energy levels, focus, higher level cognition.  2. Old right shoulder injury, likely RTC tendonitis--improved  3. Seizure post- traumatic  4. Scrotal swelling, pain----resolved 5. Post-traumatic headaches---resolved    Plan:  1. Continue low dose propranolol for headache and mood--effective---may have an opp to decr soon 2. Continue exelon if he feels it's beneficial. Can try to decrease 3mg  qhs only to judge benefit. If no difference, can consider discontinuing entirely.. 3. Concerta for attention and memory. Second rx for next month. May pick up rxes for months 3 and 4 after two months.  4. Continue  keppra 500mg  bid 5. Continue volunteering. He is doing well with this and seems to enjoy. Encouraged by tennis lessons too!  6. Unable to return to driving to right HH.  7. Discussed pt's behavior, and I see nothing but improvement overall. Some of the episodes described appear to be typical interactions between, mothers,fathers, and children. He and his wife have different styles of parenting it appears as well.  Follow up with me in 4 months. 15 minutes of face to face patient care time was spent during this visit. He remains pre-vocational. He may pick up his concerta in 2 months.

## 2017-01-31 ENCOUNTER — Ambulatory Visit (INDEPENDENT_AMBULATORY_CARE_PROVIDER_SITE_OTHER): Payer: BLUE CROSS/BLUE SHIELD | Admitting: Psychiatry

## 2017-01-31 DIAGNOSIS — F4321 Adjustment disorder with depressed mood: Secondary | ICD-10-CM | POA: Diagnosis not present

## 2017-02-01 DIAGNOSIS — Z0100 Encounter for examination of eyes and vision without abnormal findings: Secondary | ICD-10-CM | POA: Diagnosis not present

## 2017-02-01 DIAGNOSIS — H5111 Convergence insufficiency: Secondary | ICD-10-CM | POA: Diagnosis not present

## 2017-02-01 DIAGNOSIS — H5347 Heteronymous bilateral field defects: Secondary | ICD-10-CM | POA: Diagnosis not present

## 2017-02-01 DIAGNOSIS — H5581 Saccadic eye movements: Secondary | ICD-10-CM | POA: Diagnosis not present

## 2017-02-01 DIAGNOSIS — H539 Unspecified visual disturbance: Secondary | ICD-10-CM | POA: Diagnosis not present

## 2017-03-01 ENCOUNTER — Ambulatory Visit (INDEPENDENT_AMBULATORY_CARE_PROVIDER_SITE_OTHER): Payer: BLUE CROSS/BLUE SHIELD | Admitting: Psychiatry

## 2017-03-01 DIAGNOSIS — F4321 Adjustment disorder with depressed mood: Secondary | ICD-10-CM | POA: Diagnosis not present

## 2017-03-09 DIAGNOSIS — Z6829 Body mass index (BMI) 29.0-29.9, adult: Secondary | ICD-10-CM | POA: Diagnosis not present

## 2017-03-09 DIAGNOSIS — L03031 Cellulitis of right toe: Secondary | ICD-10-CM | POA: Diagnosis not present

## 2017-03-12 DIAGNOSIS — Z6829 Body mass index (BMI) 29.0-29.9, adult: Secondary | ICD-10-CM | POA: Diagnosis not present

## 2017-03-12 DIAGNOSIS — L03031 Cellulitis of right toe: Secondary | ICD-10-CM | POA: Diagnosis not present

## 2017-03-12 DIAGNOSIS — L929 Granulomatous disorder of the skin and subcutaneous tissue, unspecified: Secondary | ICD-10-CM | POA: Diagnosis not present

## 2017-03-13 DIAGNOSIS — L929 Granulomatous disorder of the skin and subcutaneous tissue, unspecified: Secondary | ICD-10-CM | POA: Diagnosis not present

## 2017-03-13 DIAGNOSIS — L03031 Cellulitis of right toe: Secondary | ICD-10-CM | POA: Diagnosis not present

## 2017-03-19 ENCOUNTER — Telehealth: Payer: Self-pay | Admitting: Physical Medicine & Rehabilitation

## 2017-03-19 DIAGNOSIS — Z6828 Body mass index (BMI) 28.0-28.9, adult: Secondary | ICD-10-CM | POA: Diagnosis not present

## 2017-03-19 DIAGNOSIS — L929 Granulomatous disorder of the skin and subcutaneous tissue, unspecified: Secondary | ICD-10-CM | POA: Diagnosis not present

## 2017-03-19 DIAGNOSIS — L03031 Cellulitis of right toe: Secondary | ICD-10-CM | POA: Diagnosis not present

## 2017-03-19 NOTE — Telephone Encounter (Signed)
MAILING MD COMPLETED SECTION FOR [employer] BACK TO [place]

## 2017-04-05 ENCOUNTER — Ambulatory Visit (INDEPENDENT_AMBULATORY_CARE_PROVIDER_SITE_OTHER): Payer: BLUE CROSS/BLUE SHIELD | Admitting: Psychiatry

## 2017-04-05 DIAGNOSIS — F4321 Adjustment disorder with depressed mood: Secondary | ICD-10-CM

## 2017-04-09 DIAGNOSIS — L980 Pyogenic granuloma: Secondary | ICD-10-CM | POA: Diagnosis not present

## 2017-04-10 ENCOUNTER — Encounter: Payer: Self-pay | Admitting: Podiatry

## 2017-04-10 ENCOUNTER — Telehealth: Payer: Self-pay

## 2017-04-10 ENCOUNTER — Ambulatory Visit (INDEPENDENT_AMBULATORY_CARE_PROVIDER_SITE_OTHER): Payer: Medicare Other | Admitting: Podiatry

## 2017-04-10 VITALS — BP 149/76

## 2017-04-10 DIAGNOSIS — L600 Ingrowing nail: Secondary | ICD-10-CM

## 2017-04-10 DIAGNOSIS — S066X1S Traumatic subarachnoid hemorrhage with loss of consciousness of 30 minutes or less, sequela: Secondary | ICD-10-CM

## 2017-04-10 MED ORDER — NEOMYCIN-POLYMYXIN-HC 3.5-10000-1 OT SOLN: OTIC | 0 refills | Status: DC

## 2017-04-10 MED ORDER — METHYLPHENIDATE HCL ER (OSM) 36 MG PO TBCR: 36.0000 mg | EXTENDED_RELEASE_TABLET | Freq: Every day | ORAL | 0 refills | Status: DC

## 2017-04-10 NOTE — Progress Notes (Signed)
   Subjective:    Patient ID: Albert [name], male    DOB: October 05, 1975, 41 y.o.   MRN: [id number]  HPI: He presents today with his wife with a chief complaint of a painful right great toenail. He states that his ingrown and has been this way for about 3 weeks. Has a history of head trauma with change in memory status.  Review of Systems  Skin: Positive for color change and rash.  All other systems reviewed and are negative.      Objective:   Physical Exam: Vital signs are stable alert and oriented 3. Pulses are palpable. Neurologic sensorium is intact deep tendon reflexes are intact symmetrical bilateral. Orthopedic evaluation shows all joints distal to the ankle for range of motion without crepitation. Cutaneous evaluation of a straight supple well-hydrated cutis abscess with granuloma medial border of the hallux right painful in nature sharp incurvated nail margin.        Assessment & Plan:  Ingrown nail paronychia Says hallux right.  Plan: Discussed etiology pathology conservative versus surgical therapies. At this point performed chemical matrixectomy. He tolerated this procedure well without complications after local anesthesia was admission. He was provided with oral and written home-going instructions for care and soaking of his toe as well as prescription for Cortisporin otic which she will apply twice daily after soaking. I will follow up with him once they return from [place].

## 2017-04-10 NOTE — Patient Instructions (Signed)

## 2017-04-10 NOTE — Telephone Encounter (Signed)
Patient called requesting refills of medication concerta, per your last note:   Concerta for attention and memory. Second rx for next month. May pick up rxes for months 3 and 4 after two months.   Prescriptions printed for doctor to be signed.

## 2017-05-03 ENCOUNTER — Ambulatory Visit: Payer: Medicare Other | Admitting: Podiatry

## 2017-05-10 ENCOUNTER — Ambulatory Visit (INDEPENDENT_AMBULATORY_CARE_PROVIDER_SITE_OTHER): Payer: Self-pay | Admitting: Podiatry

## 2017-05-10 ENCOUNTER — Encounter: Payer: Self-pay | Admitting: Podiatry

## 2017-05-10 VITALS — BP 158/96 | HR 64

## 2017-05-10 DIAGNOSIS — L600 Ingrowing nail: Secondary | ICD-10-CM

## 2017-05-10 NOTE — Progress Notes (Signed)
He presents today for follow-up of his matrixectomy right hallux. States that it is no longer draining.  Objective: Vital signs are stable alert and oriented 3. Pulses are palpable. Neurologic sensorium is intact. Deep tendon reflexes are intact. No erythema edema cellulitis drainage or odor tibial border of the hallux right has gone on to heal uneventfully.  Assessment: Well-healing matrixectomy hallux right.  Plan: Discontinue soaks follow up with me as needed

## 2017-05-17 ENCOUNTER — Ambulatory Visit (INDEPENDENT_AMBULATORY_CARE_PROVIDER_SITE_OTHER): Payer: BLUE CROSS/BLUE SHIELD | Admitting: Psychiatry

## 2017-05-17 DIAGNOSIS — F4321 Adjustment disorder with depressed mood: Secondary | ICD-10-CM | POA: Diagnosis not present

## 2017-05-22 ENCOUNTER — Encounter: Payer: Self-pay | Admitting: Physical Medicine & Rehabilitation

## 2017-05-22 ENCOUNTER — Encounter
Payer: BLUE CROSS/BLUE SHIELD | Attending: Physical Medicine & Rehabilitation | Admitting: Physical Medicine & Rehabilitation

## 2017-05-22 VITALS — BP 146/84 | HR 62

## 2017-05-22 DIAGNOSIS — R569 Unspecified convulsions: Secondary | ICD-10-CM | POA: Insufficient documentation

## 2017-05-22 DIAGNOSIS — Z23 Encounter for immunization: Secondary | ICD-10-CM | POA: Diagnosis not present

## 2017-05-22 DIAGNOSIS — Z9889 Other specified postprocedural states: Secondary | ICD-10-CM | POA: Insufficient documentation

## 2017-05-22 DIAGNOSIS — R51 Headache: Secondary | ICD-10-CM | POA: Diagnosis not present

## 2017-05-22 DIAGNOSIS — Z8673 Personal history of transient ischemic attack (TIA), and cerebral infarction without residual deficits: Secondary | ICD-10-CM | POA: Diagnosis not present

## 2017-05-22 DIAGNOSIS — F068 Other specified mental disorders due to known physiological condition: Secondary | ICD-10-CM | POA: Diagnosis not present

## 2017-05-22 DIAGNOSIS — Z09 Encounter for follow-up examination after completed treatment for conditions other than malignant neoplasm: Secondary | ICD-10-CM | POA: Insufficient documentation

## 2017-05-22 DIAGNOSIS — S066X9A Traumatic subarachnoid hemorrhage with loss of consciousness of unspecified duration, initial encounter: Secondary | ICD-10-CM | POA: Insufficient documentation

## 2017-05-22 DIAGNOSIS — S065X9S Traumatic subdural hemorrhage with loss of consciousness of unspecified duration, sequela: Secondary | ICD-10-CM

## 2017-05-22 DIAGNOSIS — S066X1S Traumatic subarachnoid hemorrhage with loss of consciousness of 30 minutes or less, sequela: Secondary | ICD-10-CM | POA: Diagnosis not present

## 2017-05-22 DIAGNOSIS — Z87891 Personal history of nicotine dependence: Secondary | ICD-10-CM | POA: Diagnosis not present

## 2017-05-22 DIAGNOSIS — R4189 Other symptoms and signs involving cognitive functions and awareness: Secondary | ICD-10-CM

## 2017-05-22 DIAGNOSIS — S069X0S Unspecified intracranial injury without loss of consciousness, sequela: Secondary | ICD-10-CM

## 2017-05-22 MED ORDER — METHYLPHENIDATE HCL ER (OSM) 36 MG PO TBCR: 36.0000 mg | EXTENDED_RELEASE_TABLET | Freq: Every day | ORAL | 0 refills | Status: DC

## 2017-05-22 NOTE — Progress Notes (Signed)
Subjective:    Patient ID: Albert [name], male    DOB: 1976/02/14, 41 y.o.   MRN: [id number]  HPI   Albert [name] is here in follow up of his chronic pain.  He has had an uneventful summer for the most part. He did develop an infected right ingrown toe nail which was treated at the [facility].   He remains on ritalin for his attention and mood. This is effective.   He provides care for his family and keeps up the house. His wife is working currently.    Pain Inventory Average Pain 0 Pain Right Now 0 My pain is na  In the last 24 hours, has pain interfered with the following? General activity 0 Relation with others 0 Enjoyment of life 0 What TIME of day is your pain at its worst? evening Sleep (in general) Fair  Pain is worse with: na Pain improves with: rest Relief from Meds: 8  Mobility Do you have any goals in this area?  no  Function Do you have any goals in this area?  no  Neuro/Psych No problems in this area  Prior Studies Any changes since last visit?  no  Physicians involved in your care Any changes since last visit?  no   Family History  Problem Relation Age of Onset  . Heart disease Father    Social History   Social History  . Marital status: Married    Spouse name: N/A  . Number of children: N/A  . Years of education: N/A   Social History Main Topics  . Smoking status: Former Smoker    Types: Cigarettes    Quit date: 12/17/1999  . Smokeless tobacco: Never Used  . Alcohol use 0.0 oz/week  . Drug use: No  . Sexual activity: Not on file   Other Topics Concern  . Not on file   Social History Narrative  . No narrative on file   Past Surgical History:  Procedure Laterality Date  . CRANIOPLASTY N/A 06/10/2013   Procedure: CRANIOPLASTY;  Surgeon: Floyce Stakes, MD;  Location: [facility];  Service: Neurosurgery;  Laterality: N/A;  CRANIOPLASTY  . CRANIOTOMY Left 12/15/2012   Procedure: CRANIECTOMY HEMATOMA EVACUATION SUBDURAL WITH  PLACEMENT OF BONE FLAP IN ABDOMINAL WALL;  Surgeon: Floyce Stakes, MD;  Location: [place] [facility];  Service: Neurosurgery;  Laterality: Left;  . CRANIOTOMY Left 12/28/2012   Procedure: CRANIOTOMY HEMATOMA EVACUATION SUBDURAL;  Surgeon: Erline Levine, MD;  Location: [place] [facility];  Service: Neurosurgery;  Laterality: Left;  . PEG PLACEMENT N/A 12/31/2012   Procedure: PERCUTANEOUS ENDOSCOPIC GASTROSTOMY (PEG) PLACEMENT;  Surgeon: Zenovia Jarred, MD;  Location: [place];  Service: General;  Laterality: N/A;  bedside  peg  . PERCUTANEOUS TRACHEOSTOMY N/A 12/31/2012   Procedure: PERCUTANEOUS TRACHEOSTOMY (BEDSIDE);  Surgeon: Zenovia Jarred, MD;  Location: [place];  Service: General;  Laterality: N/A;  . WRIST SURGERY Right 2008   Past Medical History:  Diagnosis Date  . Headache(784.0)   . Rotator cuff injury    right  . Seizures ([place])    12/28/2012  . Stroke [facility])    some aphasia , weakness in the R arm , Out pt. rehab currently   . Swallowing problem    still relearning controlled swallowing - rec'ing speech therapy in rehab  . Traumatic brain injury ([place])    There were no vitals taken for this visit.  Opioid Risk Score:   Fall Risk Score:  `1  Depression screen PHQ 2/9  Depression screen [facility] 2/9 05/17/2016 04/30/2013  Decreased Interest 0 0  Down, Depressed, Hopeless 0 0  PHQ - 2 Score 0 0  Some recent data might be hidden     Review of Systems  Constitutional: Negative.   HENT: Negative.   Eyes: Negative.   Respiratory: Negative.   Cardiovascular: Negative.   Gastrointestinal: Negative.   Endocrine: Negative.   Genitourinary: Negative.   Musculoskeletal: Negative.   Skin: Negative.   Allergic/Immunologic: Negative.   Neurological: Negative.   Hematological: Negative.   Psychiatric/Behavioral: Negative.   All other systems reviewed and are negative.      Objective:   Physical Exam  General: Alert and oriented x 3, No apparent distress. Wearing helmet.    HEENT:Head is normocephalic, atraumatic, PERRLA, EOMI, sclera anicteric, oral mucosa pink and moist, dentition intact, ext ear canals clear,  Neck:Supple without JVD or lymphadenopathy  Heart:RRR  Chest:CTA B Abdomen:Soft, non-tender, non-distended, bowel sounds positive.  Extremities:no edema  Skin:Clean and intact without signs of breakdown  Neuro:memory better as is attention. Keeps his thoughts together much more. much improved insight Musculoskeletal:right shoulder is moving well.  Psych:Pt's affect is  dynamic and pleasant. Pt is cooperative. No irritability seen     Assessment:  1. 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 Status post craniotomy evacuation of hematoma. Persistent ongoing cognitive deficits 2. Old right shoulder injury, likely RTC tendonitis--improved  3. Seizure post- traumatic  4. Scrotal swelling, pain----resolved 5. Post-traumatic headaches---resolved    Plan:  1. Continue low dose propranolol for headache and mood--effective---may have an opp to decr soon 2. Continue exelon if he feels it's beneficial. Can try to decrease 3mg  qhs only to judge benefit. If no difference, can consider discontinuing entirely.. 3. Concerta for attention and memory. Second rx for next month. May pick up rxes for months 3 and 4 after two months.  4. Continue keppra 500mg  bid 5. Continue HEP and community volunteering as possible.  6. Unable to return to driving to right HH.  7. Discussed pt's behavior, and I see nothing but improvement overall. Some of the episodes described appear to be typical interactions between, mothers,fathers, and children. He and his wife have different styles of parenting it appears as well.  Follow up with me or NP in about 2 months. 15 minutes of face to face patient care time was spent during this visit. He remains pre-vocational.  Greater than 50% of time during this encounter was spent  counseling patient/family in regard to review of medications, HEP.

## 2017-05-22 NOTE — Patient Instructions (Signed)
PLEASE FEEL FREE TO CALL OUR OFFICE WITH ANY PROBLEMS OR QUESTIONS ([phone])      

## 2017-05-31 ENCOUNTER — Telehealth: Payer: Self-pay | Admitting: Physical Medicine & Rehabilitation

## 2017-05-31 NOTE — Telephone Encounter (Signed)
Patient would like to know if Dr. Naaman Plummer could put something in writing for tax purposes, that patient is taking tennis and golf lesions to help with eye-hand coordination due to his brain injury.  Please call patient.

## 2017-06-01 NOTE — Telephone Encounter (Signed)
Mr [place] notified.  Letter placed in mail.

## 2017-06-01 NOTE — Telephone Encounter (Signed)
Letter written

## 2017-06-26 ENCOUNTER — Ambulatory Visit (INDEPENDENT_AMBULATORY_CARE_PROVIDER_SITE_OTHER): Payer: Medicare Other | Admitting: Psychiatry

## 2017-06-26 DIAGNOSIS — F4321 Adjustment disorder with depressed mood: Secondary | ICD-10-CM

## 2017-06-28 ENCOUNTER — Ambulatory Visit: Payer: BLUE CROSS/BLUE SHIELD | Admitting: Psychiatry

## 2017-07-02 DIAGNOSIS — L929 Granulomatous disorder of the skin and subcutaneous tissue, unspecified: Secondary | ICD-10-CM | POA: Diagnosis not present

## 2017-07-02 DIAGNOSIS — S069X0S Unspecified intracranial injury without loss of consciousness, sequela: Secondary | ICD-10-CM | POA: Diagnosis not present

## 2017-07-02 DIAGNOSIS — Z6829 Body mass index (BMI) 29.0-29.9, adult: Secondary | ICD-10-CM | POA: Diagnosis not present

## 2017-07-02 DIAGNOSIS — J309 Allergic rhinitis, unspecified: Secondary | ICD-10-CM | POA: Diagnosis not present

## 2017-07-18 ENCOUNTER — Encounter: Payer: Self-pay | Admitting: Physical Medicine & Rehabilitation

## 2017-07-18 ENCOUNTER — Encounter
Payer: BLUE CROSS/BLUE SHIELD | Attending: Physical Medicine & Rehabilitation | Admitting: Physical Medicine & Rehabilitation

## 2017-07-18 VITALS — BP 117/79 | HR 71

## 2017-07-18 DIAGNOSIS — F068 Other specified mental disorders due to known physiological condition: Secondary | ICD-10-CM | POA: Diagnosis not present

## 2017-07-18 DIAGNOSIS — Z87891 Personal history of nicotine dependence: Secondary | ICD-10-CM | POA: Insufficient documentation

## 2017-07-18 DIAGNOSIS — Z8673 Personal history of transient ischemic attack (TIA), and cerebral infarction without residual deficits: Secondary | ICD-10-CM | POA: Insufficient documentation

## 2017-07-18 DIAGNOSIS — R51 Headache: Secondary | ICD-10-CM | POA: Diagnosis not present

## 2017-07-18 DIAGNOSIS — Z09 Encounter for follow-up examination after completed treatment for conditions other than malignant neoplasm: Secondary | ICD-10-CM | POA: Insufficient documentation

## 2017-07-18 DIAGNOSIS — S066X1S Traumatic subarachnoid hemorrhage with loss of consciousness of 30 minutes or less, sequela: Secondary | ICD-10-CM | POA: Diagnosis not present

## 2017-07-18 DIAGNOSIS — R569 Unspecified convulsions: Secondary | ICD-10-CM | POA: Insufficient documentation

## 2017-07-18 DIAGNOSIS — S069X0S Unspecified intracranial injury without loss of consciousness, sequela: Secondary | ICD-10-CM | POA: Diagnosis not present

## 2017-07-18 DIAGNOSIS — Z9889 Other specified postprocedural states: Secondary | ICD-10-CM | POA: Insufficient documentation

## 2017-07-18 DIAGNOSIS — S066X9A Traumatic subarachnoid hemorrhage with loss of consciousness of unspecified duration, initial encounter: Secondary | ICD-10-CM | POA: Insufficient documentation

## 2017-07-18 DIAGNOSIS — M7918 Myalgia, other site: Secondary | ICD-10-CM

## 2017-07-18 MED ORDER — METHYLPHENIDATE HCL ER (OSM) 36 MG PO TBCR: 36.0000 mg | EXTENDED_RELEASE_TABLET | Freq: Every day | ORAL | 0 refills | Status: DC

## 2017-07-18 NOTE — Progress Notes (Signed)
Subjective:    Patient ID: Albert [name], male    DOB: 1976/07/08, 41 y.o.   MRN: [id number]  HPI   Albert [name] is here in follow up of his TBI. He has been "trying to stay busy" with the kids and household. He's still doing some volunteering. He has also taken up tennis again and is struggling somewhat with depth perception and balance.   He has some interest in playing golf.   His pain levels have been reasonable. He has noticed pain in his upper right back/shoulder.  He thinks it has been present for about a week to 10 days.  He is doing some general stretching but not focusing on it.  He did not play tennis this past week as he had the kids while his wife was away.  Pain does not radiate past the upper shoulder.  He has not applied any heat or other modalities nor has he used any Tylenol for pain.  He remains on exelon for memory as well as concerta daily.  Headaches are minimal.  He remains on propranolol 20 mg twice daily.   Pain Inventory Average Pain 0 Pain Right Now 6 My pain is sharp  In the last 24 hours, has pain interfered with the following? General activity 0 Relation with others 0 Enjoyment of life 0 What TIME of day is your pain at its worst? morning Sleep (in general) Good  Pain is worse with: walking, bending, standing and some activites Pain improves with: rest Relief from Meds: na  Mobility walk without assistance ability to climb steps?  yes do you drive?  no  Function disabled: date disabled 2014  Neuro/Psych No problems in this area  Prior Studies Any changes since last visit?  no  Physicians involved in your care Any changes since last visit?  no   Family History  Problem Relation Age of Onset  . Heart disease Father    Social History   Socioeconomic History  . Marital status: Married    Spouse name: Not on file  . Number of children: Not on file  . Years of education: Not on file  . Highest education level: Not on file  Social  Needs  . Financial resource strain: Not on file  . Food insecurity - worry: Not on file  . Food insecurity - inability: Not on file  . Transportation needs - medical: Not on file  . Transportation needs - non-medical: Not on file  Occupational History  . Not on file  Tobacco Use  . Smoking status: Former Smoker    Types: Cigarettes    Last attempt to quit: 12/17/1999    Years since quitting: 17.5  . Smokeless tobacco: Never Used  Substance and Sexual Activity  . Alcohol use: Yes    Alcohol/week: 0.0 oz  . Drug use: No  . Sexual activity: Not on file  Other Topics Concern  . Not on file  Social History Narrative  . Not on file   Past Surgical History:  Procedure Laterality Date  . WRIST SURGERY Right 2008   Past Medical History:  Diagnosis Date  . Headache(784.0)   . Rotator cuff injury    right  . Seizures ([place])    12/28/2012  . Stroke [facility])    some aphasia , weakness in the R arm , Out pt. rehab currently   . Swallowing problem    still relearning controlled swallowing - rec'ing speech therapy in rehab  . Traumatic brain injury (  [place])    There were no vitals taken for this visit.  Opioid Risk Score:   Fall Risk Score:  `1  Depression screen PHQ 2/9  Depression screen [facility] 2/9 05/17/2016 04/30/2013  Decreased Interest 0 0  Down, Depressed, Hopeless 0 0  PHQ - 2 Score 0 0  Some recent data might be hidden     Review of Systems  Constitutional: Negative.   HENT: Negative.   Eyes: Negative.   Respiratory: Negative.   Cardiovascular: Negative.   Gastrointestinal: Negative.   Endocrine: Negative.   Genitourinary: Negative.   Musculoskeletal: Positive for neck pain.  Skin: Negative.   Allergic/Immunologic: Negative.   Neurological: Negative.   Hematological: Negative.   Psychiatric/Behavioral: Negative.   All other systems reviewed and are negative.      Objective:   Physical Exam  General: Alert and oriented x 3, No apparent distress. Wearing helmet.    HEENT:Head is normocephalic, atraumatic, PERRLA, EOMI, sclera anicteric, oral mucosa pink and moist, dentition intact, ext ear canals clear,  Neck:Supple without JVD or lymphadenopathy  Heart:Regular rate chest:Normal effort Abdomen:Soft, non-tender, non-distended, bowel sounds positive.  Extremities:no edema  Skin:Clean and intact without signs of breakdown  Neuro:Cognition is near baseline.  He continues to display attention deficits as well as problems with short-term memory and processing.  He is functional however at a community level. Musculoskeletal:right trapezius is tender with palpation and there is a palpable knot which reproduces pain when pressed upon.  Right shoulder range of motion is fairly functional.  Pain was not increased with shoulder movement or neck range of motion. Psych:Pt's affect is dynamic and pleasant. Pt is cooperative. No irritability seen    Assessment:  1. 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 Status post craniotomy evacuation of hematoma. Persistent ongoing cognitive deficits 2. Old right shoulder injury, likely RTC tendonitis- now with myofascial  Pain in right trap---suspect this is secondary to shoulder mechanics/RTC  3. Seizure post- traumatic  4. Scrotal swelling, pain----resolved 5. Post-traumatic headaches---resolved    Plan:  1. Continue low dose propranolol for headache and mood--effective---may have an opp to decr soon 2. Continue exelon 3 mg twice daily as he feels that this helps with his memory.   3. Concerta for attention and memory. Second rx for next month.  He has been compliant with the use of the Concerta. 4. Continue keppra 500mg  bid 5. Continue  community volunteering and exercise as possible.  6. Unable to return to driving to right HH.  7. Right shoulder ROM exercises were described. Also should apply heat and ice/massage too. If persistent we could consider other  options.  Follow up with me or NP in about 2 months.  Fifteen minutes of face to face patient care time were spent during this visit. All questions were encouraged and answered.

## 2017-07-18 NOTE — Patient Instructions (Signed)
STRETCH YOUR RIGHT SHOULDER DAILY, USING THE EXERCISES YOU HAVE, ESPECIALLY BEFORE YOU PLAY TENNIS.  APPLY HEAT AS WELL. CONSIDER MASSAGE.     PLEASE FEEL FREE TO CALL OUR OFFICE WITH ANY PROBLEMS OR QUESTIONS ([phone])

## 2017-07-26 ENCOUNTER — Ambulatory Visit (INDEPENDENT_AMBULATORY_CARE_PROVIDER_SITE_OTHER): Payer: BLUE CROSS/BLUE SHIELD | Admitting: Psychiatry

## 2017-07-26 DIAGNOSIS — F4321 Adjustment disorder with depressed mood: Secondary | ICD-10-CM

## 2017-07-27 ENCOUNTER — Telehealth: Payer: Self-pay

## 2017-07-27 DIAGNOSIS — R454 Irritability and anger: Secondary | ICD-10-CM | POA: Diagnosis not present

## 2017-07-27 DIAGNOSIS — S069X0S Unspecified intracranial injury without loss of consciousness, sequela: Secondary | ICD-10-CM | POA: Diagnosis not present

## 2017-07-27 DIAGNOSIS — Z6829 Body mass index (BMI) 29.0-29.9, adult: Secondary | ICD-10-CM | POA: Diagnosis not present

## 2017-07-27 NOTE — Telephone Encounter (Signed)
Albert [name], calling for Dr. Benjamine Mola, wants to speak to Dr. Naaman Plummer about a mutual patient.

## 2017-07-30 NOTE — Telephone Encounter (Signed)
There is not a return phone number?  No indication about what this is about either?

## 2017-07-30 NOTE — Telephone Encounter (Signed)
This came from Dr. Murvin Natal office. You can reach her by calling [phone]. I spoke with Lattie Haw (front office) and she said this is regarding patients desire for depression medication.  Dr. Ernie Hew was hoping to consult with Dr. Naaman Plummer about this before prescribing.  She said that Dr. Ernie Hew asked to be called out of exam room as soon as you call

## 2017-08-06 DIAGNOSIS — J069 Acute upper respiratory infection, unspecified: Secondary | ICD-10-CM | POA: Diagnosis not present

## 2017-08-07 ENCOUNTER — Encounter: Payer: Self-pay | Admitting: Psychology

## 2017-08-14 DIAGNOSIS — Z6829 Body mass index (BMI) 29.0-29.9, adult: Secondary | ICD-10-CM | POA: Diagnosis not present

## 2017-08-14 DIAGNOSIS — G4700 Insomnia, unspecified: Secondary | ICD-10-CM | POA: Diagnosis not present

## 2017-08-14 DIAGNOSIS — J069 Acute upper respiratory infection, unspecified: Secondary | ICD-10-CM | POA: Diagnosis not present

## 2017-08-14 DIAGNOSIS — F411 Generalized anxiety disorder: Secondary | ICD-10-CM | POA: Diagnosis not present

## 2017-08-30 ENCOUNTER — Ambulatory Visit (INDEPENDENT_AMBULATORY_CARE_PROVIDER_SITE_OTHER): Payer: BLUE CROSS/BLUE SHIELD | Admitting: Psychiatry

## 2017-08-30 ENCOUNTER — Other Ambulatory Visit: Payer: Self-pay | Admitting: Physical Medicine & Rehabilitation

## 2017-08-30 DIAGNOSIS — F4321 Adjustment disorder with depressed mood: Secondary | ICD-10-CM

## 2017-09-05 IMAGING — CR DG FOOT COMPLETE 3+V*R*
3 series · 3 of 3 positions shown · non-contrast
Comparison: None

CLINICAL DATA: Inversion injury RIGHT foot 3 weeks ago playing
baseball, increased pain for 3 days after extensive walking, pain
across to forefoot and third through fifth toes, initial encounter

EXAM:
RIGHT FOOT COMPLETE - 3+ VIEW

[x foot ap right]
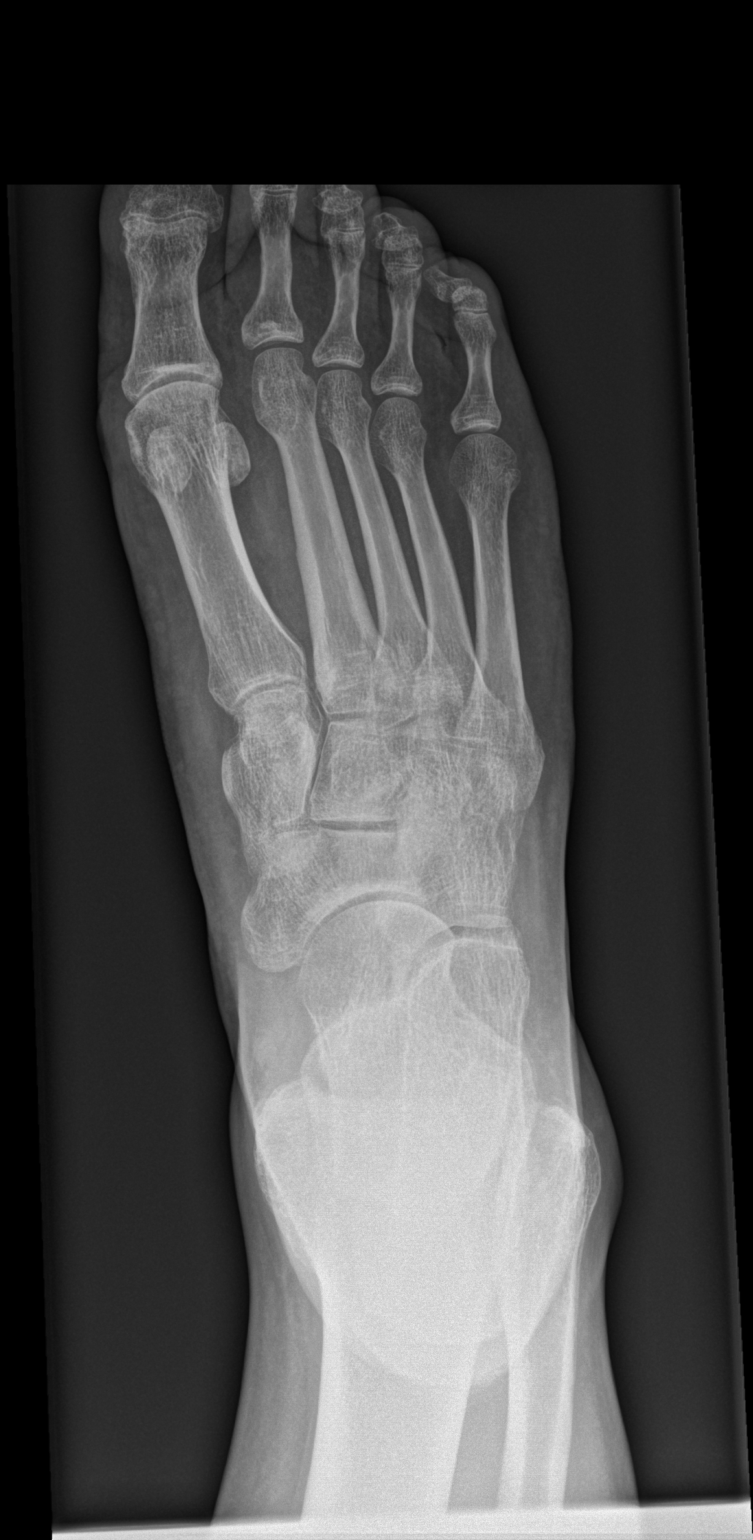

[x foot obl right]
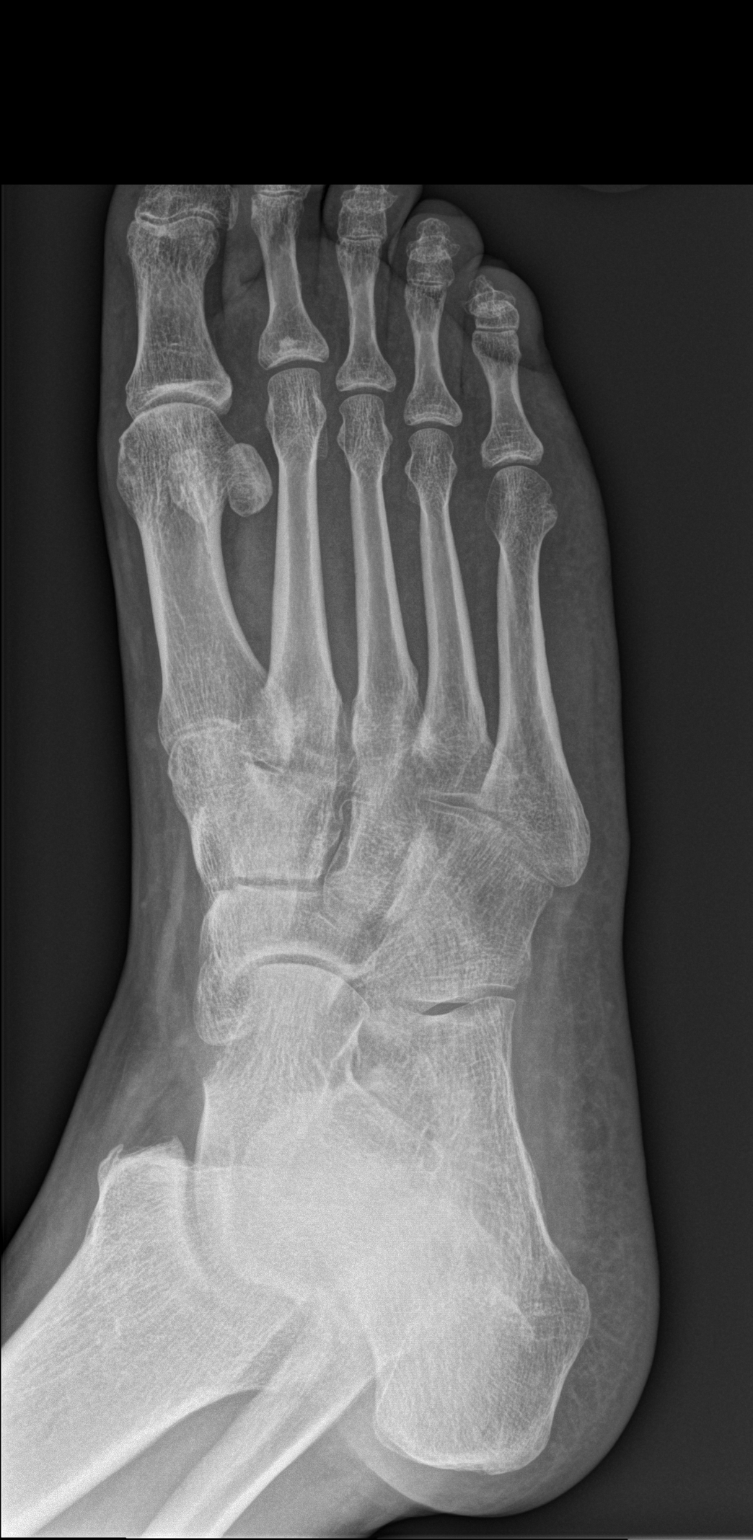

[x foot lat right]
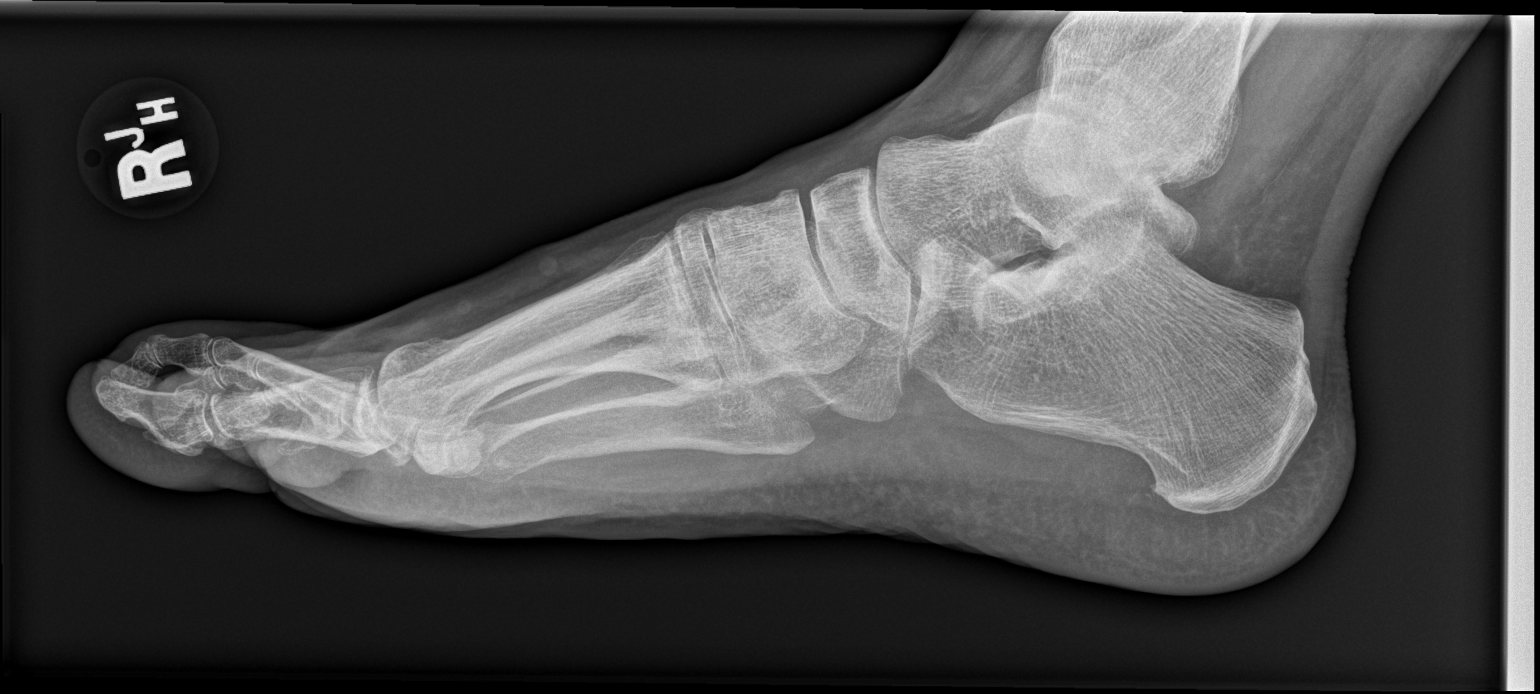

[3 of 3 positions shown; findings below may reference images not displayed]

FINDINGS: Osseous demineralization.

Mild degenerative changes at IP joint great toe.

Remain joint spaces fairly well preserved.

Small bone island at base of proximal phalanx second toe.

No acute fracture, dislocation, or bone destruction.
IMPRESSION: No acute osseous abnormalities.

## 2017-09-07 ENCOUNTER — Other Ambulatory Visit: Payer: Self-pay | Admitting: Physical Medicine & Rehabilitation

## 2017-09-10 ENCOUNTER — Encounter: Payer: Self-pay | Admitting: Physical Medicine & Rehabilitation

## 2017-09-10 DIAGNOSIS — S066X1S Traumatic subarachnoid hemorrhage with loss of consciousness of 30 minutes or less, sequela: Secondary | ICD-10-CM

## 2017-09-18 NOTE — Telephone Encounter (Signed)
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 We will arrange follow up neuro-psych testing to quanitfy cognitive levels more specifically. I will supplement that with a letter as well when complete.

## 2017-09-26 ENCOUNTER — Encounter: Payer: Self-pay | Admitting: Physical Medicine & Rehabilitation

## 2017-09-26 ENCOUNTER — Encounter
Payer: BLUE CROSS/BLUE SHIELD | Attending: Physical Medicine & Rehabilitation | Admitting: Physical Medicine & Rehabilitation

## 2017-09-26 VITALS — BP 142/85 | HR 68 | Resp 14

## 2017-09-26 DIAGNOSIS — Z09 Encounter for follow-up examination after completed treatment for conditions other than malignant neoplasm: Secondary | ICD-10-CM | POA: Insufficient documentation

## 2017-09-26 DIAGNOSIS — S066X9A Traumatic subarachnoid hemorrhage with loss of consciousness of unspecified duration, initial encounter: Secondary | ICD-10-CM | POA: Diagnosis not present

## 2017-09-26 DIAGNOSIS — S066X1S Traumatic subarachnoid hemorrhage with loss of consciousness of 30 minutes or less, sequela: Secondary | ICD-10-CM

## 2017-09-26 DIAGNOSIS — Z87891 Personal history of nicotine dependence: Secondary | ICD-10-CM | POA: Diagnosis not present

## 2017-09-26 DIAGNOSIS — F0630 Mood disorder due to known physiological condition, unspecified: Secondary | ICD-10-CM

## 2017-09-26 DIAGNOSIS — Z9889 Other specified postprocedural states: Secondary | ICD-10-CM | POA: Diagnosis not present

## 2017-09-26 DIAGNOSIS — R51 Headache: Secondary | ICD-10-CM | POA: Insufficient documentation

## 2017-09-26 DIAGNOSIS — S069X9S Unspecified intracranial injury with loss of consciousness of unspecified duration, sequela: Secondary | ICD-10-CM | POA: Diagnosis not present

## 2017-09-26 DIAGNOSIS — S069XAS Unspecified intracranial injury with loss of consciousness status unknown, sequela: Secondary | ICD-10-CM

## 2017-09-26 DIAGNOSIS — Z8673 Personal history of transient ischemic attack (TIA), and cerebral infarction without residual deficits: Secondary | ICD-10-CM | POA: Insufficient documentation

## 2017-09-26 DIAGNOSIS — F068 Other specified mental disorders due to known physiological condition: Secondary | ICD-10-CM | POA: Diagnosis not present

## 2017-09-26 DIAGNOSIS — S069X0S Unspecified intracranial injury without loss of consciousness, sequela: Secondary | ICD-10-CM | POA: Diagnosis not present

## 2017-09-26 DIAGNOSIS — H53461 Homonymous bilateral field defects, right side: Secondary | ICD-10-CM | POA: Diagnosis not present

## 2017-09-26 DIAGNOSIS — R569 Unspecified convulsions: Secondary | ICD-10-CM | POA: Insufficient documentation

## 2017-09-26 MED ORDER — METHYLPHENIDATE HCL ER (OSM) 36 MG PO TBCR: 36.0000 mg | EXTENDED_RELEASE_TABLET | Freq: Every day | ORAL | 0 refills | Status: DC

## 2017-09-26 NOTE — Patient Instructions (Signed)
PLEASE FEEL FREE TO CALL OUR OFFICE WITH ANY PROBLEMS OR QUESTIONS ([phone])      
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
## 2017-09-27 ENCOUNTER — Ambulatory Visit: Payer: Self-pay | Admitting: Psychology

## 2017-09-27 DIAGNOSIS — H5581 Saccadic eye movements: Secondary | ICD-10-CM | POA: Diagnosis not present

## 2017-09-27 DIAGNOSIS — H5111 Convergence insufficiency: Secondary | ICD-10-CM | POA: Diagnosis not present

## 2017-09-27 DIAGNOSIS — Z0100 Encounter for examination of eyes and vision without abnormal findings: Secondary | ICD-10-CM | POA: Diagnosis not present

## 2017-09-27 DIAGNOSIS — H539 Unspecified visual disturbance: Secondary | ICD-10-CM | POA: Diagnosis not present

## 2017-09-27 DIAGNOSIS — H5347 Heteronymous bilateral field defects: Secondary | ICD-10-CM | POA: Diagnosis not present

## 2017-09-28 ENCOUNTER — Encounter (HOSPITAL_BASED_OUTPATIENT_CLINIC_OR_DEPARTMENT_OTHER): Payer: BLUE CROSS/BLUE SHIELD | Admitting: Psychology

## 2017-09-28 DIAGNOSIS — R51 Headache: Secondary | ICD-10-CM | POA: Diagnosis not present

## 2017-09-28 DIAGNOSIS — F068 Other specified mental disorders due to known physiological condition: Secondary | ICD-10-CM | POA: Diagnosis not present

## 2017-09-28 DIAGNOSIS — Z09 Encounter for follow-up examination after completed treatment for conditions other than malignant neoplasm: Secondary | ICD-10-CM | POA: Diagnosis not present

## 2017-09-28 DIAGNOSIS — R569 Unspecified convulsions: Secondary | ICD-10-CM | POA: Diagnosis not present

## 2017-09-28 DIAGNOSIS — Z8673 Personal history of transient ischemic attack (TIA), and cerebral infarction without residual deficits: Secondary | ICD-10-CM | POA: Diagnosis not present

## 2017-09-28 DIAGNOSIS — S066X1S Traumatic subarachnoid hemorrhage with loss of consciousness of 30 minutes or less, sequela: Secondary | ICD-10-CM

## 2017-09-28 DIAGNOSIS — S069X9S Unspecified intracranial injury with loss of consciousness of unspecified duration, sequela: Secondary | ICD-10-CM | POA: Diagnosis not present

## 2017-09-28 DIAGNOSIS — Z87891 Personal history of nicotine dependence: Secondary | ICD-10-CM | POA: Diagnosis not present

## 2017-09-28 DIAGNOSIS — S069X0S Unspecified intracranial injury without loss of consciousness, sequela: Secondary | ICD-10-CM | POA: Diagnosis not present

## 2017-09-28 DIAGNOSIS — S069XAS Unspecified intracranial injury with loss of consciousness status unknown, sequela: Secondary | ICD-10-CM

## 2017-09-28 DIAGNOSIS — R4189 Other symptoms and signs involving cognitive functions and awareness: Secondary | ICD-10-CM

## 2017-09-28 DIAGNOSIS — S066X9A Traumatic subarachnoid hemorrhage with loss of consciousness of unspecified duration, initial encounter: Secondary | ICD-10-CM | POA: Diagnosis not present

## 2017-09-28 DIAGNOSIS — F0630 Mood disorder due to known physiological condition, unspecified: Secondary | ICD-10-CM | POA: Diagnosis not present

## 2017-09-28 DIAGNOSIS — Z9889 Other specified postprocedural states: Secondary | ICD-10-CM | POA: Diagnosis not present

## 2017-10-03 ENCOUNTER — Ambulatory Visit: Payer: BLUE CROSS/BLUE SHIELD | Admitting: Psychiatry

## 2017-10-28 ENCOUNTER — Encounter: Payer: Self-pay | Admitting: Psychology
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
## 2017-10-30 DIAGNOSIS — J309 Allergic rhinitis, unspecified: Secondary | ICD-10-CM | POA: Diagnosis not present

## 2017-10-30 DIAGNOSIS — J069 Acute upper respiratory infection, unspecified: Secondary | ICD-10-CM | POA: Diagnosis not present

## 2017-10-30 DIAGNOSIS — Z6830 Body mass index (BMI) 30.0-30.9, adult: Secondary | ICD-10-CM | POA: Diagnosis not present

## 2017-11-07 DIAGNOSIS — Z114 Encounter for screening for human immunodeficiency virus [HIV]: Secondary | ICD-10-CM | POA: Diagnosis not present

## 2017-11-07 DIAGNOSIS — Z0000 Encounter for general adult medical examination without abnormal findings: Secondary | ICD-10-CM | POA: Diagnosis not present

## 2017-11-07 DIAGNOSIS — Z1329 Encounter for screening for other suspected endocrine disorder: Secondary | ICD-10-CM | POA: Diagnosis not present

## 2017-11-07 DIAGNOSIS — Z13220 Encounter for screening for lipoid disorders: Secondary | ICD-10-CM | POA: Diagnosis not present

## 2017-11-09 DIAGNOSIS — Z6829 Body mass index (BMI) 29.0-29.9, adult: Secondary | ICD-10-CM | POA: Diagnosis not present

## 2017-11-09 DIAGNOSIS — Z0000 Encounter for general adult medical examination without abnormal findings: Secondary | ICD-10-CM | POA: Diagnosis not present

## 2017-11-09 DIAGNOSIS — Z23 Encounter for immunization: Secondary | ICD-10-CM | POA: Diagnosis not present

## 2017-11-15 ENCOUNTER — Encounter: Payer: Self-pay | Admitting: Psychology

## 2017-11-15 ENCOUNTER — Encounter: Payer: BLUE CROSS/BLUE SHIELD | Attending: Physical Medicine & Rehabilitation | Admitting: Psychology

## 2017-11-15 DIAGNOSIS — G44309 Post-traumatic headache, unspecified, not intractable: Secondary | ICD-10-CM | POA: Diagnosis not present

## 2017-11-15 DIAGNOSIS — F0630 Mood disorder due to known physiological condition, unspecified: Secondary | ICD-10-CM | POA: Diagnosis not present

## 2017-11-15 DIAGNOSIS — R4189 Other symptoms and signs involving cognitive functions and awareness: Secondary | ICD-10-CM | POA: Diagnosis not present

## 2017-11-15 DIAGNOSIS — R569 Unspecified convulsions: Secondary | ICD-10-CM | POA: Diagnosis not present

## 2017-11-15 DIAGNOSIS — Z9889 Other specified postprocedural states: Secondary | ICD-10-CM | POA: Insufficient documentation

## 2017-11-15 DIAGNOSIS — S066X9A Traumatic subarachnoid hemorrhage with loss of consciousness of unspecified duration, initial encounter: Secondary | ICD-10-CM | POA: Diagnosis not present

## 2017-11-15 DIAGNOSIS — F068 Other specified mental disorders due to known physiological condition: Secondary | ICD-10-CM

## 2017-11-15 DIAGNOSIS — S069X0S Unspecified intracranial injury without loss of consciousness, sequela: Secondary | ICD-10-CM | POA: Diagnosis not present

## 2017-11-15 DIAGNOSIS — F39 Unspecified mood [affective] disorder: Secondary | ICD-10-CM | POA: Diagnosis not present

## 2017-11-15 DIAGNOSIS — R51 Headache: Secondary | ICD-10-CM | POA: Diagnosis not present

## 2017-11-15 DIAGNOSIS — S065X9S Traumatic subdural hemorrhage with loss of consciousness of unspecified duration, sequela: Secondary | ICD-10-CM | POA: Diagnosis not present

## 2017-11-15 DIAGNOSIS — H53461 Homonymous bilateral field defects, right side: Secondary | ICD-10-CM | POA: Insufficient documentation

## 2017-11-15 DIAGNOSIS — Z87891 Personal history of nicotine dependence: Secondary | ICD-10-CM | POA: Diagnosis not present

## 2017-11-15 DIAGNOSIS — S069X9S Unspecified intracranial injury with loss of consciousness of unspecified duration, sequela: Secondary | ICD-10-CM

## 2017-11-15 DIAGNOSIS — S069XAS Unspecified intracranial injury with loss of consciousness status unknown, sequela: Secondary | ICD-10-CM

## 2017-11-15 DIAGNOSIS — S066X1S Traumatic subarachnoid hemorrhage with loss of consciousness of 30 minutes or less, sequela: Secondary | ICD-10-CM | POA: Diagnosis not present

## 2017-11-15 DIAGNOSIS — Z09 Encounter for follow-up examination after completed treatment for conditions other than malignant neoplasm: Secondary | ICD-10-CM | POA: Insufficient documentation

## 2017-11-15 DIAGNOSIS — Z8673 Personal history of transient ischemic attack (TIA), and cerebral infarction without residual deficits: Secondary | ICD-10-CM | POA: Diagnosis not present

## 2017-11-15 DIAGNOSIS — R561 Post traumatic seizures: Secondary | ICD-10-CM | POA: Diagnosis not present

## 2017-11-15 NOTE — Progress Notes (Signed)
Administered the Comprehensive Attention Battery and the CAB CPT measure.  Patient will return to complete the WIAS-IV and the WMS-IV.  Today was 2 hours of test administration.

## 2017-11-19 ENCOUNTER — Other Ambulatory Visit: Payer: Self-pay | Admitting: Physical Medicine & Rehabilitation

## 2017-12-06 ENCOUNTER — Encounter: Payer: BLUE CROSS/BLUE SHIELD | Admitting: Psychology

## 2017-12-07 ENCOUNTER — Encounter: Payer: BLUE CROSS/BLUE SHIELD | Admitting: Psychology

## 2017-12-10 ENCOUNTER — Telehealth: Payer: Self-pay | Admitting: *Deleted

## 2017-12-10 DIAGNOSIS — S066X1S Traumatic subarachnoid hemorrhage with loss of consciousness of 30 minutes or less, sequela: Secondary | ICD-10-CM

## 2017-12-10 NOTE — Telephone Encounter (Signed)
Albert [name] called for a refill on his concerta.  His last fill was 11/09/17 and his next appt is 01/23/18. I will notify him it will be electronically sent to pharmacy.

## 2017-12-11 ENCOUNTER — Encounter: Payer: BLUE CROSS/BLUE SHIELD | Admitting: Psychology

## 2017-12-11 MED ORDER — METHYLPHENIDATE HCL ER (OSM) 36 MG PO TBCR: 36.0000 mg | EXTENDED_RELEASE_TABLET | Freq: Every day | ORAL | 0 refills | Status: DC

## 2017-12-11 NOTE — Telephone Encounter (Signed)
Filled rx's for this month and next

## 2017-12-13 ENCOUNTER — Telehealth: Payer: Self-pay

## 2017-12-13 NOTE — Telephone Encounter (Signed)
Patient called requesting information on paperwork for use of [employer] bus.  Please advise.

## 2017-12-14 NOTE — Telephone Encounter (Signed)
Paperwork was filled out and signed. It is up in the front office collapsible folder.  Patient notified that it is ready for pick up

## 2017-12-14 NOTE — Telephone Encounter (Signed)
I believe I filled that paperwork out weeks to months ago. Can someone follow up on this?

## 2017-12-19 ENCOUNTER — Encounter: Payer: Self-pay | Admitting: Psychology

## 2017-12-19 ENCOUNTER — Encounter: Payer: BLUE CROSS/BLUE SHIELD | Attending: Physical Medicine & Rehabilitation | Admitting: Psychology

## 2017-12-19 ENCOUNTER — Ambulatory Visit: Payer: BLUE CROSS/BLUE SHIELD | Admitting: Psychology

## 2017-12-19 DIAGNOSIS — S065X9S Traumatic subdural hemorrhage with loss of consciousness of unspecified duration, sequela: Secondary | ICD-10-CM | POA: Diagnosis not present

## 2017-12-19 DIAGNOSIS — F39 Unspecified mood [affective] disorder: Secondary | ICD-10-CM | POA: Insufficient documentation

## 2017-12-19 DIAGNOSIS — S066X9A Traumatic subarachnoid hemorrhage with loss of consciousness of unspecified duration, initial encounter: Secondary | ICD-10-CM | POA: Diagnosis not present

## 2017-12-19 DIAGNOSIS — H53461 Homonymous bilateral field defects, right side: Secondary | ICD-10-CM | POA: Diagnosis not present

## 2017-12-19 DIAGNOSIS — G44309 Post-traumatic headache, unspecified, not intractable: Secondary | ICD-10-CM | POA: Diagnosis not present

## 2017-12-19 DIAGNOSIS — Z09 Encounter for follow-up examination after completed treatment for conditions other than malignant neoplasm: Secondary | ICD-10-CM | POA: Diagnosis not present

## 2017-12-19 DIAGNOSIS — S066X1S Traumatic subarachnoid hemorrhage with loss of consciousness of 30 minutes or less, sequela: Secondary | ICD-10-CM

## 2017-12-19 DIAGNOSIS — Z87891 Personal history of nicotine dependence: Secondary | ICD-10-CM | POA: Diagnosis not present

## 2017-12-19 DIAGNOSIS — S069X0S Unspecified intracranial injury without loss of consciousness, sequela: Secondary | ICD-10-CM

## 2017-12-19 DIAGNOSIS — R561 Post traumatic seizures: Secondary | ICD-10-CM | POA: Insufficient documentation

## 2017-12-19 DIAGNOSIS — Z8673 Personal history of transient ischemic attack (TIA), and cerebral infarction without residual deficits: Secondary | ICD-10-CM | POA: Insufficient documentation

## 2017-12-19 DIAGNOSIS — R51 Headache: Secondary | ICD-10-CM | POA: Diagnosis not present

## 2017-12-19 DIAGNOSIS — R569 Unspecified convulsions: Secondary | ICD-10-CM | POA: Diagnosis not present

## 2017-12-19 DIAGNOSIS — F068 Other specified mental disorders due to known physiological condition: Secondary | ICD-10-CM

## 2017-12-19 DIAGNOSIS — Z9889 Other specified postprocedural states: Secondary | ICD-10-CM | POA: Diagnosis not present

## 2017-12-19 DIAGNOSIS — R4189 Other symptoms and signs involving cognitive functions and awareness: Secondary | ICD-10-CM | POA: Diagnosis not present

## 2017-12-19 NOTE — Progress Notes (Signed)
Today we administered the Wechsler Adult Intelligence Scale-for as well as the [employer] Scale-IV.  The patient appeared to put his full effort forward and try his hardest.  There were clear difficulties observed in the patient during test taking.  The patient had significant retrieval deficits.  He did have storage deficits and difficulties as well but the biggest observed deficit during the test administration phases was inability to retrieve information even long-term stores of information such as general fax and knowledge of information he would have derived through his life.  This test administration was 4 hours along with face-to-face administration by myself at a physician's level.  The administration started around 1222 [id number] and was completed around 420.

## 2017-12-24 ENCOUNTER — Encounter (HOSPITAL_BASED_OUTPATIENT_CLINIC_OR_DEPARTMENT_OTHER): Payer: BLUE CROSS/BLUE SHIELD | Admitting: Psychology

## 2017-12-24 DIAGNOSIS — Z8673 Personal history of transient ischemic attack (TIA), and cerebral infarction without residual deficits: Secondary | ICD-10-CM | POA: Diagnosis not present

## 2017-12-24 DIAGNOSIS — Z09 Encounter for follow-up examination after completed treatment for conditions other than malignant neoplasm: Secondary | ICD-10-CM | POA: Diagnosis not present

## 2017-12-24 DIAGNOSIS — R569 Unspecified convulsions: Secondary | ICD-10-CM | POA: Diagnosis not present

## 2017-12-24 DIAGNOSIS — G44309 Post-traumatic headache, unspecified, not intractable: Secondary | ICD-10-CM | POA: Diagnosis not present

## 2017-12-24 DIAGNOSIS — S069XAS Unspecified intracranial injury with loss of consciousness status unknown, sequela: Secondary | ICD-10-CM

## 2017-12-24 DIAGNOSIS — S066X1S Traumatic subarachnoid hemorrhage with loss of consciousness of 30 minutes or less, sequela: Secondary | ICD-10-CM | POA: Diagnosis not present

## 2017-12-24 DIAGNOSIS — Z9889 Other specified postprocedural states: Secondary | ICD-10-CM | POA: Diagnosis not present

## 2017-12-24 DIAGNOSIS — S069X9S Unspecified intracranial injury with loss of consciousness of unspecified duration, sequela: Secondary | ICD-10-CM

## 2017-12-24 DIAGNOSIS — F39 Unspecified mood [affective] disorder: Secondary | ICD-10-CM | POA: Diagnosis not present

## 2017-12-24 DIAGNOSIS — F0630 Mood disorder due to known physiological condition, unspecified: Secondary | ICD-10-CM | POA: Diagnosis not present

## 2017-12-24 DIAGNOSIS — S069X0S Unspecified intracranial injury without loss of consciousness, sequela: Secondary | ICD-10-CM

## 2017-12-24 DIAGNOSIS — S066X9A Traumatic subarachnoid hemorrhage with loss of consciousness of unspecified duration, initial encounter: Secondary | ICD-10-CM | POA: Diagnosis not present

## 2017-12-24 DIAGNOSIS — R561 Post traumatic seizures: Secondary | ICD-10-CM | POA: Diagnosis not present

## 2017-12-24 DIAGNOSIS — Z87891 Personal history of nicotine dependence: Secondary | ICD-10-CM | POA: Diagnosis not present

## 2017-12-24 DIAGNOSIS — S065X9S Traumatic subdural hemorrhage with loss of consciousness of unspecified duration, sequela: Secondary | ICD-10-CM | POA: Diagnosis not present

## 2017-12-24 DIAGNOSIS — F068 Other specified mental disorders due to known physiological condition: Secondary | ICD-10-CM | POA: Diagnosis not present

## 2017-12-24 DIAGNOSIS — G894 Chronic pain syndrome: Secondary | ICD-10-CM | POA: Diagnosis not present

## 2017-12-24 DIAGNOSIS — R51 Headache: Secondary | ICD-10-CM | POA: Diagnosis not present

## 2017-12-24 DIAGNOSIS — R4189 Other symptoms and signs involving cognitive functions and awareness: Secondary | ICD-10-CM | POA: Diagnosis not present

## 2017-12-27 ENCOUNTER — Encounter: Payer: Self-pay | Admitting: Psychology

## 2017-12-27 ENCOUNTER — Encounter (HOSPITAL_BASED_OUTPATIENT_CLINIC_OR_DEPARTMENT_OTHER): Payer: BLUE CROSS/BLUE SHIELD | Admitting: Psychology

## 2017-12-27 DIAGNOSIS — R51 Headache: Secondary | ICD-10-CM | POA: Diagnosis not present

## 2017-12-27 DIAGNOSIS — R561 Post traumatic seizures: Secondary | ICD-10-CM | POA: Diagnosis not present

## 2017-12-27 DIAGNOSIS — R4189 Other symptoms and signs involving cognitive functions and awareness: Secondary | ICD-10-CM | POA: Diagnosis not present

## 2017-12-27 DIAGNOSIS — S069X0S Unspecified intracranial injury without loss of consciousness, sequela: Secondary | ICD-10-CM | POA: Diagnosis not present

## 2017-12-27 DIAGNOSIS — S066X1S Traumatic subarachnoid hemorrhage with loss of consciousness of 30 minutes or less, sequela: Secondary | ICD-10-CM | POA: Diagnosis not present

## 2017-12-27 DIAGNOSIS — G894 Chronic pain syndrome: Secondary | ICD-10-CM

## 2017-12-27 DIAGNOSIS — S065X9S Traumatic subdural hemorrhage with loss of consciousness of unspecified duration, sequela: Secondary | ICD-10-CM | POA: Diagnosis not present

## 2017-12-27 DIAGNOSIS — G44309 Post-traumatic headache, unspecified, not intractable: Secondary | ICD-10-CM | POA: Diagnosis not present

## 2017-12-27 DIAGNOSIS — F39 Unspecified mood [affective] disorder: Secondary | ICD-10-CM | POA: Diagnosis not present

## 2017-12-27 DIAGNOSIS — F0630 Mood disorder due to known physiological condition, unspecified: Secondary | ICD-10-CM

## 2017-12-27 DIAGNOSIS — F068 Other specified mental disorders due to known physiological condition: Secondary | ICD-10-CM | POA: Diagnosis not present

## 2017-12-27 DIAGNOSIS — Z87891 Personal history of nicotine dependence: Secondary | ICD-10-CM | POA: Diagnosis not present

## 2017-12-27 DIAGNOSIS — S069X9S Unspecified intracranial injury with loss of consciousness of unspecified duration, sequela: Secondary | ICD-10-CM

## 2017-12-27 DIAGNOSIS — S066X9A Traumatic subarachnoid hemorrhage with loss of consciousness of unspecified duration, initial encounter: Secondary | ICD-10-CM | POA: Diagnosis not present

## 2017-12-27 DIAGNOSIS — R569 Unspecified convulsions: Secondary | ICD-10-CM | POA: Diagnosis not present

## 2017-12-27 DIAGNOSIS — Z09 Encounter for follow-up examination after completed treatment for conditions other than malignant neoplasm: Secondary | ICD-10-CM | POA: Diagnosis not present

## 2017-12-27 DIAGNOSIS — Z8673 Personal history of transient ischemic attack (TIA), and cerebral infarction without residual deficits: Secondary | ICD-10-CM | POA: Diagnosis not present

## 2017-12-27 DIAGNOSIS — Z9889 Other specified postprocedural states: Secondary | ICD-10-CM | POA: Diagnosis not present

## 2017-12-27 DIAGNOSIS — S069XAS Unspecified intracranial injury with loss of consciousness status unknown, sequela: Secondary | ICD-10-CM
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
## 2018-01-08 ENCOUNTER — Telehealth: Payer: Self-pay | Admitting: *Deleted

## 2018-01-08 ENCOUNTER — Encounter: Payer: Self-pay | Admitting: Psychology

## 2018-01-08 DIAGNOSIS — S066X1S Traumatic subarachnoid hemorrhage with loss of consciousness of 30 minutes or less, sequela: Secondary | ICD-10-CM

## 2018-01-08 NOTE — Telephone Encounter (Deleted)
Patient left a message asking for a Concerta

## 2018-01-08 NOTE — Progress Notes (Signed)
Provided feedback regarding results.

## 2018-01-08 NOTE — Telephone Encounter (Signed)
Error

## 2018-01-08 NOTE — Telephone Encounter (Signed)
Patient left a message asking for Concerta refill

## 2018-01-09 ENCOUNTER — Encounter: Payer: Self-pay | Admitting: Physical Medicine & Rehabilitation

## 2018-01-09 MED ORDER — METHYLPHENIDATE HCL ER (OSM) 36 MG PO TBCR: 36.0000 mg | EXTENDED_RELEASE_TABLET | Freq: Every day | ORAL | 0 refills | Status: DC

## 2018-01-09 NOTE — Telephone Encounter (Signed)
Pt notified Concerta prescription was sent to pharmacy

## 2018-01-09 NOTE — Telephone Encounter (Signed)
rx'es written

## 2018-01-12 ENCOUNTER — Encounter: Payer: Self-pay | Admitting: Physical Medicine & Rehabilitation

## 2018-01-15 ENCOUNTER — Encounter: Payer: Self-pay | Admitting: Physical Medicine & Rehabilitation

## 2018-01-17 ENCOUNTER — Telehealth: Payer: Self-pay | Admitting: Psychology

## 2018-01-17 NOTE — Telephone Encounter (Signed)
Pt phoned and stated he would like you to phone his wife at [phone]. Pt states it is in regards to the psych testing he had with you.  Please advise.

## 2018-01-23 ENCOUNTER — Encounter: Payer: BLUE CROSS/BLUE SHIELD | Admitting: Physical Medicine & Rehabilitation

## 2018-01-29 ENCOUNTER — Encounter: Payer: Self-pay | Admitting: Physical Medicine & Rehabilitation

## 2018-01-29 ENCOUNTER — Encounter
Payer: BLUE CROSS/BLUE SHIELD | Attending: Physical Medicine & Rehabilitation | Admitting: Physical Medicine & Rehabilitation

## 2018-01-29 VITALS — BP 122/82 | HR 64 | Ht 72.0 in | Wt 222.4 lb

## 2018-01-29 DIAGNOSIS — Z8673 Personal history of transient ischemic attack (TIA), and cerebral infarction without residual deficits: Secondary | ICD-10-CM | POA: Diagnosis not present

## 2018-01-29 DIAGNOSIS — S066X1S Traumatic subarachnoid hemorrhage with loss of consciousness of 30 minutes or less, sequela: Secondary | ICD-10-CM | POA: Diagnosis not present

## 2018-01-29 DIAGNOSIS — Z09 Encounter for follow-up examination after completed treatment for conditions other than malignant neoplasm: Secondary | ICD-10-CM | POA: Insufficient documentation

## 2018-01-29 DIAGNOSIS — R4189 Other symptoms and signs involving cognitive functions and awareness: Secondary | ICD-10-CM | POA: Diagnosis not present

## 2018-01-29 DIAGNOSIS — Z79899 Other long term (current) drug therapy: Secondary | ICD-10-CM | POA: Diagnosis not present

## 2018-01-29 DIAGNOSIS — R561 Post traumatic seizures: Secondary | ICD-10-CM | POA: Insufficient documentation

## 2018-01-29 DIAGNOSIS — Z9889 Other specified postprocedural states: Secondary | ICD-10-CM | POA: Diagnosis not present

## 2018-01-29 DIAGNOSIS — G44309 Post-traumatic headache, unspecified, not intractable: Secondary | ICD-10-CM | POA: Diagnosis not present

## 2018-01-29 DIAGNOSIS — Z87891 Personal history of nicotine dependence: Secondary | ICD-10-CM | POA: Insufficient documentation

## 2018-01-29 DIAGNOSIS — F39 Unspecified mood [affective] disorder: Secondary | ICD-10-CM | POA: Insufficient documentation

## 2018-01-29 DIAGNOSIS — S069X0S Unspecified intracranial injury without loss of consciousness, sequela: Secondary | ICD-10-CM

## 2018-01-29 DIAGNOSIS — F068 Other specified mental disorders due to known physiological condition: Secondary | ICD-10-CM

## 2018-01-29 DIAGNOSIS — Z5181 Encounter for therapeutic drug level monitoring: Secondary | ICD-10-CM

## 2018-01-29 DIAGNOSIS — S065X9S Traumatic subdural hemorrhage with loss of consciousness of unspecified duration, sequela: Secondary | ICD-10-CM | POA: Insufficient documentation

## 2018-01-29 DIAGNOSIS — H53461 Homonymous bilateral field defects, right side: Secondary | ICD-10-CM | POA: Insufficient documentation

## 2018-01-29 MED ORDER — METHYLPHENIDATE HCL ER (OSM) 36 MG PO TBCR: 36.0000 mg | EXTENDED_RELEASE_TABLET | Freq: Every day | ORAL | 0 refills | Status: DC

## 2018-01-29 NOTE — Patient Instructions (Addendum)
PLEASE FEEL FREE TO CALL OUR OFFICE WITH ANY PROBLEMS OR QUESTIONS ([phone])     DECREASE PROPRANOLOL TO 20MG  ONCE DAILY FOR 2 WEEKS THEN STOP AND OBSERVE

## 2018-01-29 NOTE — Progress Notes (Signed)
Subjective:    Patient ID: Albert [name], male    DOB: 1976/02/15, 42 y.o.   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  He states that things have been fairly stable since I last saw him.  His headaches have been under control.  He continues on Concerta for his attention and cognition.  He states that on occasion or 2 he stopped the medication in that his wife did not notice a difference in his abilities.    Pain Inventory Average Pain 0 Pain Right Now 0 My pain is na  In the last 24 hours, has pain interfered with the following? General activity 0 Relation with others 0 Enjoyment of life 0 What TIME of day is your pain at its worst? na Sleep (in general) Good  Pain is worse with: na Pain improves with: na Relief from Meds: na  Mobility walk without assistance ability to climb steps?  yes do you drive?  no  Function disabled: date disabled .  Neuro/Psych No problems in this area  Prior Studies Any changes since last visit?  no  Physicians involved in your care Any changes since last visit?  no   Family History  Problem Relation Age of Onset  . Heart disease Father    Social History   Socioeconomic History  . Marital status: Married    Spouse name: Not on file  . Number of children: Not on file  . Years of education: Not on file  . Highest education level: Not on file  Occupational History  . Not on file  Social Needs  . Financial resource strain: Not on file  . Food insecurity:    Worry: Not on file    Inability: Not on file  . Transportation needs:    Medical: Not on file    Non-medical: Not on file  Tobacco Use  . Smoking status: Former Smoker    Types: Cigarettes    Last attempt to quit: 12/17/1999    Years since quitting: 18.1  . Smokeless tobacco: Never Used  Substance and Sexual Activity  . Alcohol use: Yes    Alcohol/week: 0.0 oz  . Drug use: No  . Sexual activity: Not on file  Lifestyle  .  Physical activity:    Days per week: Not on file    Minutes per session: Not on file  . Stress: Not on file  Relationships  . Social connections:    Talks on phone: Not on file    Gets together: Not on file    Attends religious service: Not on file    Active member of club or organization: Not on file    Attends meetings of clubs or organizations: Not on file    Relationship status: Not on file  Other Topics Concern  . Not on file  Social History Narrative  . Not on file   Past Surgical History:  Procedure Laterality Date  . CRANIOPLASTY N/A 06/10/2013   Procedure: CRANIOPLASTY;  Surgeon: Floyce Stakes, MD;  Location: [facility];  Service: Neurosurgery;  Laterality: N/A;  CRANIOPLASTY  . CRANIOTOMY Left 12/15/2012   Procedure: CRANIECTOMY HEMATOMA EVACUATION SUBDURAL WITH PLACEMENT OF BONE FLAP IN ABDOMINAL WALL;  Surgeon: Floyce Stakes, MD;  Location: [place] [facility];  Service: Neurosurgery;  Laterality: Left;  . CRANIOTOMY Left 12/28/2012   Procedure: CRANIOTOMY HEMATOMA EVACUATION SUBDURAL;  Surgeon: Erline Levine, MD;  Location: [place] [facility];  Service: Neurosurgery;  Laterality: Left;  . PEG PLACEMENT N/A 12/31/2012   Procedure: PERCUTANEOUS ENDOSCOPIC GASTROSTOMY (PEG) PLACEMENT;  Surgeon: Zenovia Jarred, MD;  Location: [place];  Service: General;  Laterality: N/A;  bedside  peg  . PERCUTANEOUS TRACHEOSTOMY N/A 12/31/2012   Procedure: PERCUTANEOUS TRACHEOSTOMY (BEDSIDE);  Surgeon: Zenovia Jarred, MD;  Location: [place];  Service: General;  Laterality: N/A;  . WRIST SURGERY Right 2008   Past Medical History:  Diagnosis Date  . Headache(784.0)   . Rotator cuff injury    right  . Seizures ([place])    12/28/2012  . Stroke [facility])    some aphasia , weakness in the R arm , Out pt. rehab currently   . Swallowing problem    still relearning controlled swallowing - rec'ing speech therapy in rehab  . Traumatic brain injury ([place])    There were no vitals taken for this  visit.  Opioid Risk Score:   Fall Risk Score:  `1  Depression screen PHQ 2/9  Depression screen [facility] 2/9 05/17/2016 04/30/2013  Decreased Interest 0 0  Down, Depressed, Hopeless 0 0  PHQ - 2 Score 0 0  Some recent data might be hidden     Review of Systems  Constitutional: Positive for unexpected weight change.  HENT: Negative.   Eyes: Negative.   Respiratory: Negative.   Cardiovascular: Negative.   Gastrointestinal: Negative.   Endocrine: Negative.   Genitourinary: Negative.   Musculoskeletal: Negative.   Skin: Negative.   Allergic/Immunologic: Negative.   Neurological: Negative.   Hematological: Negative.   Psychiatric/Behavioral: Negative.   All other systems reviewed and are negative.      Objective:   Physical Exam  General: No acute distress HEENT: EOMI, oral membranes moist Cards: reg rate  Chest: normal effort Abdomen: Soft, NT, ND Skin: dry, intact Extremities: no edema Skin:Clean and intact without signs of breakdown  Neuro:short term memory and attention deficits continue. Some delays in processing. Does take notes to help with recall.  Right homonymous hemianopsia still present. Musculoskeletal: Right shoulder range of motion is generally functional  Psych:Pt's affect is dynamic and pleasant. Pt is cooperative. No irritability seen.    Assessment:  1. 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 Status post craniotomy evacuation of hematoma.Persistent ongoing cognitive deficits as well as right visual field loss.  He remains non-vocational given his ongoing deficits and inability to drive.  He has improved from a standpoint of using compensatory strategies however. 2. Old right shoulder injury, likely RTC tendonitis-generally resolved  3. Seizure post- traumatic  4. Scrotal swelling, pain----resolved 5. Post-traumatic headaches---resolved    Plan:  1. Low dose propranolol for headache and mood--WEAN  TO OFF 2. Continue exelon3 mg twice daily as he feels that this helps with his memory. 3. Concerta for attention and memory. Second rx for next month. He has been compliant with the use of the Concerta. He may continue using this as long as he feels it's helping/.   -We will continue the controlled substance monitoring program, this consists of regular clinic visits, examinations, routine drug screening, pill counts as well as use of [place] Controlled Substance Reporting System. [place] today.    -uds  -he may call for rf in 2 months l 4. Continue keppra 500mg  bid 5. Continuecommunity volunteering and community engagement as possible 6. Unable to return to driving to right homonymous hemianopsia.  7.Continue follow up with Dr. Sima Matas regarding coping/cognitive adaptation.  This  is been beneficial  Follow up with me in about 30months.15 minutes of face to face patient care time were spent during this visit. All questions were encouraged and answered.

## 2018-02-07 ENCOUNTER — Telehealth: Payer: Self-pay | Admitting: *Deleted

## 2018-02-07 LAB — TOXASSURE SELECT,+ANTIDEPR,UR

## 2018-02-07 NOTE — Telephone Encounter (Signed)
Urine drug screen for this encounter is consistent for prescribed medication 

## 2018-02-11 ENCOUNTER — Telehealth: Payer: Self-pay | Admitting: *Deleted

## 2018-02-11 NOTE — Telephone Encounter (Signed)
[name] is going to have make notes about when we fill this. There is a DNF b/f 02/27/18 at pharmacy already.

## 2018-02-11 NOTE — Telephone Encounter (Signed)
Patient is asking for refill on Concerta

## 2018-02-12 NOTE — Telephone Encounter (Signed)
Contacted Pharmacy, Rx on file, Rx filled,  Patient notified

## 2018-02-15 ENCOUNTER — Encounter: Payer: BLUE CROSS/BLUE SHIELD | Attending: Physical Medicine & Rehabilitation | Admitting: Psychology

## 2018-02-15 DIAGNOSIS — S066X1S Traumatic subarachnoid hemorrhage with loss of consciousness of 30 minutes or less, sequela: Secondary | ICD-10-CM

## 2018-02-15 DIAGNOSIS — S069XAS Unspecified intracranial injury with loss of consciousness status unknown, sequela: Secondary | ICD-10-CM

## 2018-02-15 DIAGNOSIS — F068 Other specified mental disorders due to known physiological condition: Secondary | ICD-10-CM

## 2018-02-15 DIAGNOSIS — H53461 Homonymous bilateral field defects, right side: Secondary | ICD-10-CM | POA: Diagnosis not present

## 2018-02-15 DIAGNOSIS — F39 Unspecified mood [affective] disorder: Secondary | ICD-10-CM | POA: Diagnosis not present

## 2018-02-15 DIAGNOSIS — R561 Post traumatic seizures: Secondary | ICD-10-CM | POA: Insufficient documentation

## 2018-02-15 DIAGNOSIS — Z8673 Personal history of transient ischemic attack (TIA), and cerebral infarction without residual deficits: Secondary | ICD-10-CM | POA: Insufficient documentation

## 2018-02-15 DIAGNOSIS — R4189 Other symptoms and signs involving cognitive functions and awareness: Secondary | ICD-10-CM | POA: Diagnosis not present

## 2018-02-15 DIAGNOSIS — Z09 Encounter for follow-up examination after completed treatment for conditions other than malignant neoplasm: Secondary | ICD-10-CM | POA: Diagnosis not present

## 2018-02-15 DIAGNOSIS — Z9889 Other specified postprocedural states: Secondary | ICD-10-CM | POA: Diagnosis not present

## 2018-02-15 DIAGNOSIS — S069X0S Unspecified intracranial injury without loss of consciousness, sequela: Secondary | ICD-10-CM | POA: Diagnosis not present

## 2018-02-15 DIAGNOSIS — Z87891 Personal history of nicotine dependence: Secondary | ICD-10-CM | POA: Insufficient documentation

## 2018-02-15 DIAGNOSIS — G44309 Post-traumatic headache, unspecified, not intractable: Secondary | ICD-10-CM | POA: Diagnosis not present

## 2018-02-15 DIAGNOSIS — S065X9S Traumatic subdural hemorrhage with loss of consciousness of unspecified duration, sequela: Secondary | ICD-10-CM | POA: Diagnosis not present

## 2018-02-15 DIAGNOSIS — S069X9S Unspecified intracranial injury with loss of consciousness of unspecified duration, sequela: Secondary | ICD-10-CM

## 2018-02-15 DIAGNOSIS — F0630 Mood disorder due to known physiological condition, unspecified: Secondary | ICD-10-CM | POA: Diagnosis not present

## 2018-02-18 ENCOUNTER — Encounter: Payer: Self-pay | Admitting: Psychology

## 2018-02-18 NOTE — Progress Notes (Signed)
Today was her first primary psychotherapeutic intervention with the patient.  We worked on Radiographer, therapeutic and strategies around dealing with the residual effects of his traumatic brain injury.  The patient is continued to struggle with severe deficits in his memory but has developed some coping resources that are sometimes effective for him.  There continue to be a lot of stress with the patient due to his inability to work at all and his need for supervision.  The patient does report some stressors around dealing with the interaction between the patient and his wife.  We continue to work on building coping skills and strategies around residual effects of his traumatic brain injury.

## 2018-03-04 ENCOUNTER — Encounter: Payer: Self-pay | Admitting: Physical Medicine & Rehabilitation

## 2018-03-06 NOTE — Telephone Encounter (Addendum)
Forwarded message to Dr. Naaman Plummer and printed off attachments and placed them on Dr. Charm Barges desk for review.

## 2018-03-13 ENCOUNTER — Other Ambulatory Visit: Payer: Self-pay | Admitting: Physical Medicine & Rehabilitation

## 2018-03-18 ENCOUNTER — Encounter: Payer: BLUE CROSS/BLUE SHIELD | Attending: Physical Medicine & Rehabilitation | Admitting: Psychology

## 2018-03-18 DIAGNOSIS — S069X9S Unspecified intracranial injury with loss of consciousness of unspecified duration, sequela: Secondary | ICD-10-CM | POA: Diagnosis not present

## 2018-03-18 DIAGNOSIS — S069X0S Unspecified intracranial injury without loss of consciousness, sequela: Secondary | ICD-10-CM | POA: Diagnosis not present

## 2018-03-18 DIAGNOSIS — R4189 Other symptoms and signs involving cognitive functions and awareness: Secondary | ICD-10-CM | POA: Insufficient documentation

## 2018-03-18 DIAGNOSIS — H53461 Homonymous bilateral field defects, right side: Secondary | ICD-10-CM | POA: Insufficient documentation

## 2018-03-18 DIAGNOSIS — G44309 Post-traumatic headache, unspecified, not intractable: Secondary | ICD-10-CM | POA: Insufficient documentation

## 2018-03-18 DIAGNOSIS — Z9889 Other specified postprocedural states: Secondary | ICD-10-CM | POA: Insufficient documentation

## 2018-03-18 DIAGNOSIS — Z87891 Personal history of nicotine dependence: Secondary | ICD-10-CM | POA: Insufficient documentation

## 2018-03-18 DIAGNOSIS — S066X1S Traumatic subarachnoid hemorrhage with loss of consciousness of 30 minutes or less, sequela: Secondary | ICD-10-CM | POA: Diagnosis not present

## 2018-03-18 DIAGNOSIS — F0630 Mood disorder due to known physiological condition, unspecified: Secondary | ICD-10-CM | POA: Diagnosis not present

## 2018-03-18 DIAGNOSIS — F39 Unspecified mood [affective] disorder: Secondary | ICD-10-CM | POA: Diagnosis not present

## 2018-03-18 DIAGNOSIS — R561 Post traumatic seizures: Secondary | ICD-10-CM | POA: Diagnosis not present

## 2018-03-18 DIAGNOSIS — Z8673 Personal history of transient ischemic attack (TIA), and cerebral infarction without residual deficits: Secondary | ICD-10-CM | POA: Insufficient documentation

## 2018-03-18 DIAGNOSIS — F068 Other specified mental disorders due to known physiological condition: Secondary | ICD-10-CM | POA: Diagnosis not present

## 2018-03-18 DIAGNOSIS — S065X9S Traumatic subdural hemorrhage with loss of consciousness of unspecified duration, sequela: Secondary | ICD-10-CM | POA: Diagnosis not present

## 2018-03-18 DIAGNOSIS — Z09 Encounter for follow-up examination after completed treatment for conditions other than malignant neoplasm: Secondary | ICD-10-CM | POA: Diagnosis not present

## 2018-03-28 ENCOUNTER — Encounter: Payer: Self-pay | Admitting: Psychology

## 2018-03-28 NOTE — Progress Notes (Signed)
Today, we continue to work on therapeutic issues around adjusting to his residual deficits following his cerebrovascular event.  The patient came in today along with his wife.  Patient and his wife talk about some of the frustrations that are happening in the relationship following changes in the patient following his subarachnoid bleed.  The patient's wife reports that the patient will have difficulty comprehending what is happening with the children and will get very frustrated and irritable.  She reports that she will avoid talking to him about certain things to try not to get him in an agitated state.  The patient reports that he feels like he is doing better around the kids but realizes that he needs supervision throughout.  The patient reports that it is clear that the kids are manipulating both parents as ways of trying to get what they want and splitting the patient and his wife.

## 2018-04-03 ENCOUNTER — Other Ambulatory Visit: Payer: Self-pay | Admitting: Physical Medicine & Rehabilitation

## 2018-04-04 ENCOUNTER — Telehealth: Payer: Self-pay

## 2018-04-04 NOTE — Telephone Encounter (Signed)
Casandra from [employer] called requesting that Keppra quantity be increase to #144, they have a program that is a one convenient pick up date.

## 2018-04-08 MED ORDER — LEVETIRACETAM 500 MG PO TABS: ORAL_TABLET | ORAL | 2 refills | Status: DC

## 2018-04-08 NOTE — Telephone Encounter (Signed)
Changes made, rx sent

## 2018-04-10 ENCOUNTER — Telehealth: Payer: Self-pay | Admitting: Psychology

## 2018-04-10 ENCOUNTER — Telehealth: Payer: Self-pay | Admitting: *Deleted

## 2018-04-10 NOTE — Telephone Encounter (Signed)
Patient left a voicemail asking if Dr. Sima Matas has sent 'the' letter to [employer]. I spoke with patient and he clarified that both Dr. Naaman Plummer and Dr. Sima Matas are aware.  Insurance company is Psych[employer], prison and probation services from Dr. Sima Matas.  Please contact patient

## 2018-04-10 NOTE — Telephone Encounter (Signed)
Patient called and left message asking if JR sent letter to [employer] for him?

## 2018-04-12 ENCOUNTER — Telehealth: Payer: Self-pay | Admitting: *Deleted

## 2018-04-12 DIAGNOSIS — S066X1S Traumatic subarachnoid hemorrhage with loss of consciousness of 30 minutes or less, sequela: Secondary | ICD-10-CM

## 2018-04-12 MED ORDER — METHYLPHENIDATE HCL ER (OSM) 36 MG PO TBCR: 36.0000 mg | EXTENDED_RELEASE_TABLET | Freq: Every day | ORAL | 0 refills | Status: DC

## 2018-04-12 NOTE — Telephone Encounter (Signed)
Albert [name] notified. 

## 2018-04-12 NOTE — Telephone Encounter (Signed)
rx's sent including one dated 05/11/18

## 2018-04-12 NOTE — Telephone Encounter (Signed)
  Patient called. He needs his Concerta refilled.

## 2018-04-16 ENCOUNTER — Encounter: Payer: Self-pay | Admitting: Psychology

## 2018-04-16 ENCOUNTER — Encounter: Payer: BLUE CROSS/BLUE SHIELD | Attending: Physical Medicine & Rehabilitation | Admitting: Psychology

## 2018-04-16 DIAGNOSIS — R561 Post traumatic seizures: Secondary | ICD-10-CM | POA: Diagnosis not present

## 2018-04-16 DIAGNOSIS — Z9889 Other specified postprocedural states: Secondary | ICD-10-CM | POA: Diagnosis not present

## 2018-04-16 DIAGNOSIS — S069X9S Unspecified intracranial injury with loss of consciousness of unspecified duration, sequela: Secondary | ICD-10-CM

## 2018-04-16 DIAGNOSIS — F39 Unspecified mood [affective] disorder: Secondary | ICD-10-CM | POA: Insufficient documentation

## 2018-04-16 DIAGNOSIS — Z8673 Personal history of transient ischemic attack (TIA), and cerebral infarction without residual deficits: Secondary | ICD-10-CM | POA: Insufficient documentation

## 2018-04-16 DIAGNOSIS — S066X1S Traumatic subarachnoid hemorrhage with loss of consciousness of 30 minutes or less, sequela: Secondary | ICD-10-CM

## 2018-04-16 DIAGNOSIS — F068 Other specified mental disorders due to known physiological condition: Secondary | ICD-10-CM

## 2018-04-16 DIAGNOSIS — R4189 Other symptoms and signs involving cognitive functions and awareness: Secondary | ICD-10-CM | POA: Insufficient documentation

## 2018-04-16 DIAGNOSIS — S065X9S Traumatic subdural hemorrhage with loss of consciousness of unspecified duration, sequela: Secondary | ICD-10-CM | POA: Insufficient documentation

## 2018-04-16 DIAGNOSIS — Z87891 Personal history of nicotine dependence: Secondary | ICD-10-CM | POA: Diagnosis not present

## 2018-04-16 DIAGNOSIS — H53461 Homonymous bilateral field defects, right side: Secondary | ICD-10-CM | POA: Diagnosis not present

## 2018-04-16 DIAGNOSIS — G44309 Post-traumatic headache, unspecified, not intractable: Secondary | ICD-10-CM | POA: Diagnosis not present

## 2018-04-16 DIAGNOSIS — S069X0S Unspecified intracranial injury without loss of consciousness, sequela: Secondary | ICD-10-CM

## 2018-04-16 DIAGNOSIS — Z09 Encounter for follow-up examination after completed treatment for conditions other than malignant neoplasm: Secondary | ICD-10-CM | POA: Diagnosis not present

## 2018-04-16 DIAGNOSIS — S069XAS Unspecified intracranial injury with loss of consciousness status unknown, sequela: Secondary | ICD-10-CM

## 2018-04-16 DIAGNOSIS — F0630 Mood disorder due to known physiological condition, unspecified: Secondary | ICD-10-CM

## 2018-04-16 NOTE — Progress Notes (Signed)
Today, the patient came in on his own.  His wife did drive him to the appointment and he will be riding the transportation bus home after the appointment.  The patient reports that there continues to be some considerable stressors at home.  He reports that he has done better about impulse control recently but continues to have a lot of frustration particular with his interactions with his wife.  He reports that there are a lot of frustrations with the kids learning how to split them in order to get their way.  The patient reports that he is frustrated because much of the responsibility for trying to get them to behave and follow the rules falls on his shoulder from his perspective.  The patient reports that he continues to have cognitive difficulties and is having great difficulty with reading.  We continue to work on coping skills and strategies around managing his significant residual deficits following a traumatic brain injury.

## 2018-05-07 ENCOUNTER — Encounter: Payer: Self-pay | Admitting: Psychology

## 2018-05-07 ENCOUNTER — Encounter (HOSPITAL_BASED_OUTPATIENT_CLINIC_OR_DEPARTMENT_OTHER): Payer: BLUE CROSS/BLUE SHIELD | Admitting: Psychology

## 2018-05-07 DIAGNOSIS — F0630 Mood disorder due to known physiological condition, unspecified: Secondary | ICD-10-CM

## 2018-05-07 DIAGNOSIS — S066X1S Traumatic subarachnoid hemorrhage with loss of consciousness of 30 minutes or less, sequela: Secondary | ICD-10-CM

## 2018-05-07 DIAGNOSIS — F068 Other specified mental disorders due to known physiological condition: Secondary | ICD-10-CM | POA: Diagnosis not present

## 2018-05-07 DIAGNOSIS — S065X9S Traumatic subdural hemorrhage with loss of consciousness of unspecified duration, sequela: Secondary | ICD-10-CM | POA: Diagnosis not present

## 2018-05-07 DIAGNOSIS — R561 Post traumatic seizures: Secondary | ICD-10-CM | POA: Diagnosis not present

## 2018-05-07 DIAGNOSIS — Z8673 Personal history of transient ischemic attack (TIA), and cerebral infarction without residual deficits: Secondary | ICD-10-CM | POA: Diagnosis not present

## 2018-05-07 DIAGNOSIS — H53461 Homonymous bilateral field defects, right side: Secondary | ICD-10-CM | POA: Diagnosis not present

## 2018-05-07 DIAGNOSIS — Z09 Encounter for follow-up examination after completed treatment for conditions other than malignant neoplasm: Secondary | ICD-10-CM | POA: Diagnosis not present

## 2018-05-07 DIAGNOSIS — Z87891 Personal history of nicotine dependence: Secondary | ICD-10-CM | POA: Diagnosis not present

## 2018-05-07 DIAGNOSIS — F39 Unspecified mood [affective] disorder: Secondary | ICD-10-CM | POA: Diagnosis not present

## 2018-05-07 DIAGNOSIS — S069X0S Unspecified intracranial injury without loss of consciousness, sequela: Secondary | ICD-10-CM

## 2018-05-07 DIAGNOSIS — R4189 Other symptoms and signs involving cognitive functions and awareness: Secondary | ICD-10-CM | POA: Diagnosis not present

## 2018-05-07 DIAGNOSIS — S069X9S Unspecified intracranial injury with loss of consciousness of unspecified duration, sequela: Secondary | ICD-10-CM

## 2018-05-07 DIAGNOSIS — G44309 Post-traumatic headache, unspecified, not intractable: Secondary | ICD-10-CM | POA: Diagnosis not present

## 2018-05-07 DIAGNOSIS — Z9889 Other specified postprocedural states: Secondary | ICD-10-CM | POA: Diagnosis not present

## 2018-05-07 NOTE — Progress Notes (Signed)
Today, the patient returns reporting that he has been actively working on the issues particularly the stressors between the patient and his wife.  The patient continues to not be able to drive and has to use transportation to get to our office.  The patient is not driving on his own.  The patient continues to need supervision during the day as his memory issues continue to be very problematic.  The patient has recently had an independent medical evaluation but has not heard about any of the results from this evaluation.  The patient reports that his mood is been generally good but he continues to have significant negative self-esteem and constantly criticizes himself about his lack of ability to do what he was able to do before his brain injury.  The patient reports that he is actively working on therapeutic interventions and feels like overall he is doing better.

## 2018-05-08 ENCOUNTER — Telehealth: Payer: Self-pay

## 2018-05-08 DIAGNOSIS — Z13220 Encounter for screening for lipoid disorders: Secondary | ICD-10-CM | POA: Diagnosis not present

## 2018-05-08 DIAGNOSIS — F411 Generalized anxiety disorder: Secondary | ICD-10-CM | POA: Diagnosis not present

## 2018-05-08 DIAGNOSIS — I639 Cerebral infarction, unspecified: Secondary | ICD-10-CM | POA: Diagnosis not present

## 2018-05-08 DIAGNOSIS — Z6829 Body mass index (BMI) 29.0-29.9, adult: Secondary | ICD-10-CM | POA: Diagnosis not present

## 2018-05-08 DIAGNOSIS — Z23 Encounter for immunization: Secondary | ICD-10-CM | POA: Diagnosis not present

## 2018-05-08 DIAGNOSIS — J309 Allergic rhinitis, unspecified: Secondary | ICD-10-CM | POA: Diagnosis not present

## 2018-05-08 NOTE — Telephone Encounter (Signed)
Pt called concerned about the medication Concerta that he is on. He states it decreases his appetite during the day but increases it at night starting at about 8 pm. He wants to know if there is a different medication he can take. Please advice.

## 2018-05-08 NOTE — Telephone Encounter (Signed)
It is common for the medication to decrease appetite. It shouldn't increase appetite however. I think he's probably getting hungry at night because he's not eating as much during the day. There are other options, but we decided on the concerta, I believe, given his formulary. Also other similar medicines can cause the same issue. If he has a list of available options on his formulary that I can prescribe, I would be willing to switch him.

## 2018-05-09 NOTE — Telephone Encounter (Signed)
Pt notified and will call back if he decides to check with the insurance to see what is covered.

## 2018-05-21 ENCOUNTER — Encounter: Payer: Self-pay | Admitting: Psychology

## 2018-05-21 ENCOUNTER — Encounter: Payer: BLUE CROSS/BLUE SHIELD | Attending: Physical Medicine & Rehabilitation | Admitting: Psychology

## 2018-05-21 DIAGNOSIS — S069X9S Unspecified intracranial injury with loss of consciousness of unspecified duration, sequela: Secondary | ICD-10-CM

## 2018-05-21 DIAGNOSIS — G44309 Post-traumatic headache, unspecified, not intractable: Secondary | ICD-10-CM | POA: Insufficient documentation

## 2018-05-21 DIAGNOSIS — R561 Post traumatic seizures: Secondary | ICD-10-CM | POA: Insufficient documentation

## 2018-05-21 DIAGNOSIS — F0630 Mood disorder due to known physiological condition, unspecified: Secondary | ICD-10-CM | POA: Diagnosis not present

## 2018-05-21 DIAGNOSIS — S066X1S Traumatic subarachnoid hemorrhage with loss of consciousness of 30 minutes or less, sequela: Secondary | ICD-10-CM

## 2018-05-21 DIAGNOSIS — Z87891 Personal history of nicotine dependence: Secondary | ICD-10-CM | POA: Diagnosis not present

## 2018-05-21 DIAGNOSIS — Z9889 Other specified postprocedural states: Secondary | ICD-10-CM | POA: Diagnosis not present

## 2018-05-21 DIAGNOSIS — Z09 Encounter for follow-up examination after completed treatment for conditions other than malignant neoplasm: Secondary | ICD-10-CM | POA: Diagnosis not present

## 2018-05-21 DIAGNOSIS — R4189 Other symptoms and signs involving cognitive functions and awareness: Secondary | ICD-10-CM | POA: Insufficient documentation

## 2018-05-21 DIAGNOSIS — H53461 Homonymous bilateral field defects, right side: Secondary | ICD-10-CM | POA: Insufficient documentation

## 2018-05-21 DIAGNOSIS — F068 Other specified mental disorders due to known physiological condition: Secondary | ICD-10-CM

## 2018-05-21 DIAGNOSIS — Z8673 Personal history of transient ischemic attack (TIA), and cerebral infarction without residual deficits: Secondary | ICD-10-CM | POA: Diagnosis not present

## 2018-05-21 DIAGNOSIS — F39 Unspecified mood [affective] disorder: Secondary | ICD-10-CM | POA: Diagnosis not present

## 2018-05-21 DIAGNOSIS — S065X9S Traumatic subdural hemorrhage with loss of consciousness of unspecified duration, sequela: Secondary | ICD-10-CM | POA: Diagnosis not present

## 2018-05-21 DIAGNOSIS — S069X0S Unspecified intracranial injury without loss of consciousness, sequela: Secondary | ICD-10-CM

## 2018-05-21 NOTE — Progress Notes (Signed)
Today, (05/21/2018) the patient came in and brought a copy of his recent IME conducted by a neuropsychologist in [place].  The patient reports that he understood most of what the report was talking about but did have some concerns about the description of him not having catastrophic functional deficits.  We reviewed the issues and discussed the results and talked about the consistencies between the IME evaluation and the evaluation that I done several months prior.  We talked about the deficits that continue to be shown particularly around issues related to information processing speed and memory deficits.  We continue to work on coping issues around executive functioning issues and visual-spatial and neglect issue.  The patient reports that he is coping fairly well with some of the functional loss but his inability to do things around the house like he used to do continue to create conflicts between the patient and his wife.

## 2018-06-06 ENCOUNTER — Encounter (HOSPITAL_BASED_OUTPATIENT_CLINIC_OR_DEPARTMENT_OTHER): Payer: BLUE CROSS/BLUE SHIELD | Admitting: Psychology

## 2018-06-06 ENCOUNTER — Encounter: Payer: Self-pay | Admitting: Psychology

## 2018-06-06 DIAGNOSIS — H53461 Homonymous bilateral field defects, right side: Secondary | ICD-10-CM | POA: Diagnosis not present

## 2018-06-06 DIAGNOSIS — Z87891 Personal history of nicotine dependence: Secondary | ICD-10-CM | POA: Diagnosis not present

## 2018-06-06 DIAGNOSIS — R561 Post traumatic seizures: Secondary | ICD-10-CM | POA: Diagnosis not present

## 2018-06-06 DIAGNOSIS — S065X9S Traumatic subdural hemorrhage with loss of consciousness of unspecified duration, sequela: Secondary | ICD-10-CM | POA: Diagnosis not present

## 2018-06-06 DIAGNOSIS — S069X0S Unspecified intracranial injury without loss of consciousness, sequela: Secondary | ICD-10-CM

## 2018-06-06 DIAGNOSIS — F068 Other specified mental disorders due to known physiological condition: Secondary | ICD-10-CM | POA: Diagnosis not present

## 2018-06-06 DIAGNOSIS — G44309 Post-traumatic headache, unspecified, not intractable: Secondary | ICD-10-CM | POA: Diagnosis not present

## 2018-06-06 DIAGNOSIS — F0630 Mood disorder due to known physiological condition, unspecified: Secondary | ICD-10-CM | POA: Diagnosis not present

## 2018-06-06 DIAGNOSIS — Z09 Encounter for follow-up examination after completed treatment for conditions other than malignant neoplasm: Secondary | ICD-10-CM | POA: Diagnosis not present

## 2018-06-06 DIAGNOSIS — S069X9S Unspecified intracranial injury with loss of consciousness of unspecified duration, sequela: Secondary | ICD-10-CM

## 2018-06-06 DIAGNOSIS — S066X1S Traumatic subarachnoid hemorrhage with loss of consciousness of 30 minutes or less, sequela: Secondary | ICD-10-CM | POA: Diagnosis not present

## 2018-06-06 DIAGNOSIS — R4189 Other symptoms and signs involving cognitive functions and awareness: Secondary | ICD-10-CM | POA: Diagnosis not present

## 2018-06-06 DIAGNOSIS — Z9889 Other specified postprocedural states: Secondary | ICD-10-CM | POA: Diagnosis not present

## 2018-06-06 DIAGNOSIS — F39 Unspecified mood [affective] disorder: Secondary | ICD-10-CM | POA: Diagnosis not present

## 2018-06-06 DIAGNOSIS — Z8673 Personal history of transient ischemic attack (TIA), and cerebral infarction without residual deficits: Secondary | ICD-10-CM | POA: Diagnosis not present

## 2018-06-06 NOTE — Progress Notes (Signed)
Today, (06/06/2018) the patient came in alone.  The patient reports that there continues to be some issues but they are improving between the patient is wife.  He knows that this is overwhelming to her.  The reason for service today was to continue to work on coping and dealing with residual effects of his traumatic brain injury related to an intracranial hemorrhage.  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  The patient was there recently and I was able to see him while he was there and he talked with other patients about what it happened to him who it had similar events in their lives.  The patient reports that it does help him to talk with other people and to be involved in the brain injury support groups.  The patient would like to look at volunteering efforts particularly around issues of brain injury but he is concerned that his cognitive difficulties could make that problematic.  We have continue to work on therapeutic interventions around adjusting to and dealing with current residual effects of his traumatic brain injury.  I have encouraged the patient to look at opportunities to try to volunteer or do some very limited types of work possibly associated with opportunities to help others who have had struggles to deal with.  The patient does not have the stamina or cognitive abilities to return to his previous jobs or occupation and does not have the ability to work a gainful full-time job.  We have looked at opportunities for very limited regular work.  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

## 2018-06-08 ENCOUNTER — Other Ambulatory Visit: Payer: Self-pay | Admitting: Physical Medicine & Rehabilitation

## 2018-06-10 NOTE — Telephone Encounter (Signed)
Called [name]. He is down to once daily. Will stop and see how he does. (he had not taper off yet)

## 2018-06-10 NOTE — Telephone Encounter (Signed)
Recieved electronic medication refill request for propanolol, according to last note on 01-29-18:   Plan:  1. Low dose propranolol for headache and mood--WEAN TO OFF  Unsure if ok to refill this medication given avaliable information for it.  Please advise.

## 2018-06-11 ENCOUNTER — Other Ambulatory Visit: Payer: Self-pay | Admitting: Physical Medicine & Rehabilitation

## 2018-06-14 NOTE — Telephone Encounter (Signed)
Patient requesting a refill of concerta.

## 2018-06-18 ENCOUNTER — Telehealth: Payer: Self-pay | Admitting: *Deleted

## 2018-06-18 DIAGNOSIS — S066X1S Traumatic subarachnoid hemorrhage with loss of consciousness of 30 minutes or less, sequela: Secondary | ICD-10-CM

## 2018-06-18 NOTE — Telephone Encounter (Signed)
Patient left a message asking Dr. Naaman Plummer to please refill Concerta. Patient states he has 4 tablets left

## 2018-06-19 MED ORDER — METHYLPHENIDATE HCL ER (OSM) 36 MG PO TBCR: 36.0000 mg | EXTENDED_RELEASE_TABLET | Freq: Every day | ORAL | 0 refills | Status: DC

## 2018-06-19 NOTE — Telephone Encounter (Signed)
Notified. 

## 2018-06-19 NOTE — Telephone Encounter (Signed)
Done

## 2018-07-02 DIAGNOSIS — Z6829 Body mass index (BMI) 29.0-29.9, adult: Secondary | ICD-10-CM | POA: Diagnosis not present

## 2018-07-02 DIAGNOSIS — Z719 Counseling, unspecified: Secondary | ICD-10-CM | POA: Diagnosis not present

## 2018-07-02 DIAGNOSIS — Z23 Encounter for immunization: Secondary | ICD-10-CM | POA: Diagnosis not present

## 2018-07-05 ENCOUNTER — Encounter: Payer: BLUE CROSS/BLUE SHIELD | Attending: Physical Medicine & Rehabilitation | Admitting: Psychology

## 2018-07-05 DIAGNOSIS — H53461 Homonymous bilateral field defects, right side: Secondary | ICD-10-CM | POA: Diagnosis not present

## 2018-07-05 DIAGNOSIS — Z09 Encounter for follow-up examination after completed treatment for conditions other than malignant neoplasm: Secondary | ICD-10-CM | POA: Insufficient documentation

## 2018-07-05 DIAGNOSIS — R4189 Other symptoms and signs involving cognitive functions and awareness: Secondary | ICD-10-CM | POA: Diagnosis not present

## 2018-07-05 DIAGNOSIS — S069X0S Unspecified intracranial injury without loss of consciousness, sequela: Secondary | ICD-10-CM

## 2018-07-05 DIAGNOSIS — F39 Unspecified mood [affective] disorder: Secondary | ICD-10-CM | POA: Diagnosis not present

## 2018-07-05 DIAGNOSIS — S069X9S Unspecified intracranial injury with loss of consciousness of unspecified duration, sequela: Secondary | ICD-10-CM

## 2018-07-05 DIAGNOSIS — F068 Other specified mental disorders due to known physiological condition: Secondary | ICD-10-CM | POA: Diagnosis not present

## 2018-07-05 DIAGNOSIS — Z9889 Other specified postprocedural states: Secondary | ICD-10-CM | POA: Insufficient documentation

## 2018-07-05 DIAGNOSIS — Z87891 Personal history of nicotine dependence: Secondary | ICD-10-CM | POA: Insufficient documentation

## 2018-07-05 DIAGNOSIS — G44309 Post-traumatic headache, unspecified, not intractable: Secondary | ICD-10-CM | POA: Diagnosis not present

## 2018-07-05 DIAGNOSIS — S065X9S Traumatic subdural hemorrhage with loss of consciousness of unspecified duration, sequela: Secondary | ICD-10-CM | POA: Insufficient documentation

## 2018-07-05 DIAGNOSIS — F0630 Mood disorder due to known physiological condition, unspecified: Secondary | ICD-10-CM

## 2018-07-05 DIAGNOSIS — S066X1S Traumatic subarachnoid hemorrhage with loss of consciousness of 30 minutes or less, sequela: Secondary | ICD-10-CM | POA: Diagnosis not present

## 2018-07-05 DIAGNOSIS — S069XAS Unspecified intracranial injury with loss of consciousness status unknown, sequela: Secondary | ICD-10-CM

## 2018-07-05 DIAGNOSIS — Z8673 Personal history of transient ischemic attack (TIA), and cerebral infarction without residual deficits: Secondary | ICD-10-CM | POA: Insufficient documentation

## 2018-07-05 DIAGNOSIS — R561 Post traumatic seizures: Secondary | ICD-10-CM | POA: Insufficient documentation

## 2018-07-09 ENCOUNTER — Encounter: Payer: Self-pay | Admitting: Psychology

## 2018-07-09 NOTE — Progress Notes (Signed)
Today (07/05/2018 11 AM to 12 PM) we continued our therapeutic efforts with this patient.  The patient has a history of significant ongoing cognitive difficulties resulting from his traumatic brain injury.  The patient is continued to volunteer and do as much as he can but he is quite frustrated at his lack of ability to function at a level he had before his traumatic brain injury.  The patient reports that he would like to do more thingsFrom a work standpoint and is doing as much as he can around the house but his cognitive difficulties and inability to maintain focus continue to be quite problematic.  The patient reports that his frustration continues to be quite problematic even though he is doing everything he can to try to continue to improve.  Today we continue to work on coping skills and strategies and problem-solving for how he can begin to steadily increase some of the outside activities.  However, because of his visual disturbance he is unable to drive and many things that might be available to him are none available.  We have talked about how far the walk it is to the bus stop and it would take him approximately 30 minutes to walk clear and 30 minutes back home.

## 2018-07-23 ENCOUNTER — Encounter: Payer: BLUE CROSS/BLUE SHIELD | Attending: Physical Medicine & Rehabilitation | Admitting: Psychology

## 2018-07-23 DIAGNOSIS — H53461 Homonymous bilateral field defects, right side: Secondary | ICD-10-CM | POA: Diagnosis not present

## 2018-07-23 DIAGNOSIS — F0630 Mood disorder due to known physiological condition, unspecified: Secondary | ICD-10-CM

## 2018-07-23 DIAGNOSIS — S069X0S Unspecified intracranial injury without loss of consciousness, sequela: Secondary | ICD-10-CM

## 2018-07-23 DIAGNOSIS — G44309 Post-traumatic headache, unspecified, not intractable: Secondary | ICD-10-CM | POA: Insufficient documentation

## 2018-07-23 DIAGNOSIS — F068 Other specified mental disorders due to known physiological condition: Secondary | ICD-10-CM

## 2018-07-23 DIAGNOSIS — Z8673 Personal history of transient ischemic attack (TIA), and cerebral infarction without residual deficits: Secondary | ICD-10-CM | POA: Insufficient documentation

## 2018-07-23 DIAGNOSIS — S069XAS Unspecified intracranial injury with loss of consciousness status unknown, sequela: Secondary | ICD-10-CM

## 2018-07-23 DIAGNOSIS — R4189 Other symptoms and signs involving cognitive functions and awareness: Secondary | ICD-10-CM

## 2018-07-23 DIAGNOSIS — F39 Unspecified mood [affective] disorder: Secondary | ICD-10-CM | POA: Diagnosis not present

## 2018-07-23 DIAGNOSIS — Z09 Encounter for follow-up examination after completed treatment for conditions other than malignant neoplasm: Secondary | ICD-10-CM | POA: Insufficient documentation

## 2018-07-23 DIAGNOSIS — S066X1S Traumatic subarachnoid hemorrhage with loss of consciousness of 30 minutes or less, sequela: Secondary | ICD-10-CM

## 2018-07-23 DIAGNOSIS — Z87891 Personal history of nicotine dependence: Secondary | ICD-10-CM | POA: Insufficient documentation

## 2018-07-23 DIAGNOSIS — S065X9S Traumatic subdural hemorrhage with loss of consciousness of unspecified duration, sequela: Secondary | ICD-10-CM | POA: Insufficient documentation

## 2018-07-23 DIAGNOSIS — R561 Post traumatic seizures: Secondary | ICD-10-CM | POA: Insufficient documentation

## 2018-07-23 DIAGNOSIS — Z9889 Other specified postprocedural states: Secondary | ICD-10-CM | POA: Diagnosis not present

## 2018-07-23 DIAGNOSIS — S069X9S Unspecified intracranial injury with loss of consciousness of unspecified duration, sequela: Secondary | ICD-10-CM | POA: Diagnosis not present

## 2018-07-31 ENCOUNTER — Encounter (HOSPITAL_BASED_OUTPATIENT_CLINIC_OR_DEPARTMENT_OTHER): Payer: BLUE CROSS/BLUE SHIELD | Admitting: Physical Medicine & Rehabilitation

## 2018-07-31 ENCOUNTER — Encounter: Payer: Self-pay | Admitting: Physical Medicine & Rehabilitation

## 2018-07-31 VITALS — BP 129/84 | HR 70 | Resp 70 | Ht 72.0 in | Wt 221.0 lb

## 2018-07-31 DIAGNOSIS — F068 Other specified mental disorders due to known physiological condition: Secondary | ICD-10-CM

## 2018-07-31 DIAGNOSIS — S066X1S Traumatic subarachnoid hemorrhage with loss of consciousness of 30 minutes or less, sequela: Secondary | ICD-10-CM | POA: Diagnosis not present

## 2018-07-31 DIAGNOSIS — Z8673 Personal history of transient ischemic attack (TIA), and cerebral infarction without residual deficits: Secondary | ICD-10-CM | POA: Diagnosis not present

## 2018-07-31 DIAGNOSIS — G44309 Post-traumatic headache, unspecified, not intractable: Secondary | ICD-10-CM | POA: Diagnosis not present

## 2018-07-31 DIAGNOSIS — H53461 Homonymous bilateral field defects, right side: Secondary | ICD-10-CM

## 2018-07-31 DIAGNOSIS — S069X0S Unspecified intracranial injury without loss of consciousness, sequela: Secondary | ICD-10-CM

## 2018-07-31 DIAGNOSIS — Z87891 Personal history of nicotine dependence: Secondary | ICD-10-CM | POA: Diagnosis not present

## 2018-07-31 DIAGNOSIS — Z09 Encounter for follow-up examination after completed treatment for conditions other than malignant neoplasm: Secondary | ICD-10-CM | POA: Diagnosis not present

## 2018-07-31 DIAGNOSIS — F39 Unspecified mood [affective] disorder: Secondary | ICD-10-CM | POA: Diagnosis not present

## 2018-07-31 DIAGNOSIS — S065X9S Traumatic subdural hemorrhage with loss of consciousness of unspecified duration, sequela: Secondary | ICD-10-CM | POA: Diagnosis not present

## 2018-07-31 DIAGNOSIS — R4189 Other symptoms and signs involving cognitive functions and awareness: Secondary | ICD-10-CM

## 2018-07-31 DIAGNOSIS — Z9889 Other specified postprocedural states: Secondary | ICD-10-CM | POA: Diagnosis not present

## 2018-07-31 DIAGNOSIS — R561 Post traumatic seizures: Secondary | ICD-10-CM | POA: Diagnosis not present

## 2018-07-31 MED ORDER — METHYLPHENIDATE HCL ER (OSM) 36 MG PO TBCR: 36.0000 mg | EXTENDED_RELEASE_TABLET | Freq: Every day | ORAL | 0 refills | Status: DC

## 2018-07-31 NOTE — Progress Notes (Signed)
Subjective:    Patient ID: Albert [name], male    DOB: 03/03/1976, 42 y.o.   MRN: [id number]  HPI   Albert [name] is here in follow up of his TBI.  He states that he has been doing fairly well. He is independent at home. 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 He has continued volunteering at [facility].   He has occasional low back pain when he walks the dog. He uses tylenol and ibuprofen for pain relief.  He does some simple stretches.    Pain Inventory Average Pain 6 Pain Right Now 6 My pain is sharp  In the last 24 hours, has pain interfered with the following? General activity 0 Relation with others 0 Enjoyment of life 0 What TIME of day is your pain at its worst? night Sleep (in general) Fair  Pain is worse with: bending and some activites Pain improves with: rest and medication Relief from Meds: no pain meds  Mobility walk without assistance how many minutes can you walk? 60  ability to climb steps?  yes do you drive?  no Do you have any goals in this area?  yes  Function not employed: date last employed . disabled: date disabled .  Neuro/Psych No problems in this area  Prior Studies Any changes since last visit?  no  Physicians involved in your care Any changes since last visit?  no   Family History  Problem Relation Age of Onset  . Heart disease Father    Social History   Socioeconomic History  . Marital status: Married    Spouse name: Not on file  . Number of children: Not on file  . Years of education: Not on file  . Highest education level: Not on file  Occupational History  . Not on file  Social Needs  . Financial resource strain: Not on file  . Food insecurity:    Worry: Not on file    Inability: Not on file  . Transportation needs:    Medical: Not on file    Non-medical: Not on file  Tobacco Use  . Smoking status: Former Smoker    Types: Cigarettes    Last attempt to quit: 12/17/1999    Years since quitting: 18.6  .  Smokeless tobacco: Never Used  Substance and Sexual Activity  . Alcohol use: Yes  . Drug use: No  . Sexual activity: Not on file  Lifestyle  . Physical activity:    Days per week: Not on file    Minutes per session: Not on file  . Stress: Not on file  Relationships  . Social connections:    Talks on phone: Not on file    Gets together: Not on file    Attends religious service: Not on file    Active member of club or organization: Not on file    Attends meetings of clubs or organizations: Not on file    Relationship status: Not on file  Other Topics Concern  . Not on file  Social History Narrative  . Not on file   Past Surgical History:  Procedure Laterality Date  . CRANIOPLASTY N/A 06/10/2013   Procedure: CRANIOPLASTY;  Surgeon: Floyce Stakes, MD;  Location: [facility];  Service: Neurosurgery;  Laterality: N/A;  CRANIOPLASTY  . CRANIOTOMY Left 12/15/2012   Procedure: CRANIECTOMY HEMATOMA EVACUATION SUBDURAL WITH PLACEMENT OF BONE FLAP IN ABDOMINAL WALL;  Surgeon: Floyce Stakes, MD;  Location: [facility];  Service: Neurosurgery;  Laterality: Left;  . CRANIOTOMY Left 12/28/2012   Procedure: CRANIOTOMY HEMATOMA EVACUATION SUBDURAL;  Surgeon: Erline Levine, MD;  Location: [place] [facility];  Service: Neurosurgery;  Laterality: Left;  . PEG PLACEMENT N/A 12/31/2012   Procedure: PERCUTANEOUS ENDOSCOPIC GASTROSTOMY (PEG) PLACEMENT;  Surgeon: Zenovia Jarred, MD;  Location: [place];  Service: General;  Laterality: N/A;  bedside  peg  . PERCUTANEOUS TRACHEOSTOMY N/A 12/31/2012   Procedure: PERCUTANEOUS TRACHEOSTOMY (BEDSIDE);  Surgeon: Zenovia Jarred, MD;  Location: [place];  Service: General;  Laterality: N/A;  . WRIST SURGERY Right 2008   Past Medical History:  Diagnosis Date  . Headache(784.0)   . Rotator cuff injury    right  . Seizures ([place])    12/28/2012  . Stroke [facility])    some aphasia , weakness in the R arm , Out pt. rehab currently   . Swallowing problem    still  relearning controlled swallowing - rec'ing speech therapy in rehab  . Traumatic brain injury ([place])    BP 129/84   Pulse 70   Resp (!) 70   Ht 6' (1.829 m)   Wt 221 lb (100.2 kg)   SpO2 94%   BMI 29.97 kg/m   Opioid Risk Score:   Fall Risk Score:  `1  Depression screen PHQ 2/9  Depression screen [facility] 2/9 05/17/2016 04/30/2013  Decreased Interest 0 0  Down, Depressed, Hopeless 0 0  PHQ - 2 Score 0 0  Some recent data might be hidden    Review of Systems  Constitutional: Negative.   HENT: Negative.   Eyes: Negative.   Respiratory: Negative.   Cardiovascular: Negative.   Gastrointestinal: Negative.   Endocrine: Negative.   Genitourinary: Negative.   Musculoskeletal: Negative.   Skin: Negative.   Allergic/Immunologic: Negative.   Neurological: Negative.   Hematological: Negative.   Psychiatric/Behavioral: Negative.   All other systems reviewed and are negative.      Objective:   Physical Exam General: No acute distress HEENT: EOMI, oral membranes moist Cards: reg rate  Chest: normal effort Abdomen: Soft, NT, ND Skin: dry, intact Extremities: no edema Skin:Clean and intact without signs of breakdown  Neuro:short term memory and attention deficits ongoing. Processes better. Uses notes.  Right homonymous hemianopsia still present. Musculoskeletal: LB tender.  Psych:Pt's affect is dynamic and pleasant. Pt is cooperative. No irritability seen.    Assessment:  1. 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 Status post craniotomy evacuation of hematoma.Persistent ongoing cognitive deficitsas well as right visual field loss. He remains non-vocational given his ongoing deficits and inability to drive. He has improved from a standpoint of using compensatory strategies however. 2. Old right shoulder injury, likely RTC tendonitis-generally resolved 3. Seizure post- traumatic  4. Scrotal swelling, pain----resolved 5.  Post-traumatic headaches---resolved    Plan:  1. Low dose propranolol for headache and mood---ongoing 2. Continue exelon3 mg twice daily as he feels that this helps with his memory. 3. Concerta for attention and memory. Second rx for next month. He has been compliant with the use of the Concerta. He may continue using this as long as he feels it's helping/.              We will continue the controlled substance monitoring program, this consists of regular clinic visits, examinations, routine drug screening, pill counts as well as use of [place] Controlled Substance Reporting System. [place] was [place] today.  Marland Kitchen               -  he may call for rf in 2 months  4. Continue keppra 500mg  bid 5. Continuecommunity volunteering. Encouraged vocational efforts.  6. Unable to return to driving to righthomonymous hemianopsia.  7.Continue follow up with Dr. Sima Matas regarding coping/cognitive adaptation.  This is been beneficial 8. Provided low back exercise packet  Follow up with me in about85months.63minutes of face to face patient care time were spent during this visit. All questions were encouraged and answered.Greater than 50% of time during this encounter was spent counseling patient/family in regard to vocational efforts, medication effects.

## 2018-07-31 NOTE — Patient Instructions (Signed)
PLEASE FEEL FREE TO CALL OUR OFFICE WITH ANY PROBLEMS OR QUESTIONS ([phone])      

## 2018-08-04 ENCOUNTER — Encounter: Payer: Self-pay | Admitting: Psychology

## 2018-08-04 NOTE — Progress Notes (Signed)
Today we continue to work on therapeutic interventions around adjustment to the ongoing cognitive changes and difficulties he has experienced following his traumatic brain injury via a vascular event.  The patient reports that he is coping better with the issues at home with his wife not being fully engaged in the household when he is there.  The patient reports that he is coping a little bit better with his volunteer work at the hospital and finding a better time to come in and volunteer.  The patient, however, continues to struggle with transportation as his visual deficits and attentional deficits remain very problematic.

## 2018-08-13 ENCOUNTER — Ambulatory Visit: Payer: Self-pay | Admitting: Psychology

## 2018-08-13 DIAGNOSIS — Z3009 Encounter for other general counseling and advice on contraception: Secondary | ICD-10-CM | POA: Diagnosis not present

## 2018-08-19 ENCOUNTER — Encounter

## 2018-08-22 ENCOUNTER — Encounter: Payer: Self-pay | Admitting: Psychology

## 2018-08-22 ENCOUNTER — Encounter: Payer: BLUE CROSS/BLUE SHIELD | Attending: Physical Medicine & Rehabilitation | Admitting: Psychology

## 2018-08-22 DIAGNOSIS — F39 Unspecified mood [affective] disorder: Secondary | ICD-10-CM | POA: Diagnosis not present

## 2018-08-22 DIAGNOSIS — S065X9S Traumatic subdural hemorrhage with loss of consciousness of unspecified duration, sequela: Secondary | ICD-10-CM | POA: Diagnosis not present

## 2018-08-22 DIAGNOSIS — Z09 Encounter for follow-up examination after completed treatment for conditions other than malignant neoplasm: Secondary | ICD-10-CM | POA: Diagnosis not present

## 2018-08-22 DIAGNOSIS — G44309 Post-traumatic headache, unspecified, not intractable: Secondary | ICD-10-CM | POA: Insufficient documentation

## 2018-08-22 DIAGNOSIS — F0630 Mood disorder due to known physiological condition, unspecified: Secondary | ICD-10-CM

## 2018-08-22 DIAGNOSIS — R4189 Other symptoms and signs involving cognitive functions and awareness: Secondary | ICD-10-CM | POA: Diagnosis not present

## 2018-08-22 DIAGNOSIS — F068 Other specified mental disorders due to known physiological condition: Secondary | ICD-10-CM | POA: Diagnosis not present

## 2018-08-22 DIAGNOSIS — R561 Post traumatic seizures: Secondary | ICD-10-CM | POA: Insufficient documentation

## 2018-08-22 DIAGNOSIS — H53461 Homonymous bilateral field defects, right side: Secondary | ICD-10-CM | POA: Diagnosis not present

## 2018-08-22 DIAGNOSIS — S069X0S Unspecified intracranial injury without loss of consciousness, sequela: Secondary | ICD-10-CM

## 2018-08-22 DIAGNOSIS — Z87891 Personal history of nicotine dependence: Secondary | ICD-10-CM | POA: Insufficient documentation

## 2018-08-22 DIAGNOSIS — Z8673 Personal history of transient ischemic attack (TIA), and cerebral infarction without residual deficits: Secondary | ICD-10-CM | POA: Insufficient documentation

## 2018-08-22 DIAGNOSIS — S066X1S Traumatic subarachnoid hemorrhage with loss of consciousness of 30 minutes or less, sequela: Secondary | ICD-10-CM | POA: Diagnosis not present

## 2018-08-22 DIAGNOSIS — Z9889 Other specified postprocedural states: Secondary | ICD-10-CM | POA: Diagnosis not present

## 2018-08-22 DIAGNOSIS — S069X9S Unspecified intracranial injury with loss of consciousness of unspecified duration, sequela: Secondary | ICD-10-CM

## 2018-08-22 NOTE — Progress Notes (Signed)
08/22/2018:  Today the patient comes in and reports that he is continued to work on the therapeutic interventions we have been developing.  The patient reports that he has been working on trying to find some type of part-time job if possible.  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  The patient reports that he is generally positive.  However, he stresses that not being able to provide for his kids like he would like to during the Christmas time and this really reinforces some of the changes that he has had since his traumatic brain injury.  We continue to work on therapeutic interventions around coping and dealing with the residual effects of his traumatic brain injury.  The patient is to return in 1 month.

## 2018-09-10 ENCOUNTER — Ambulatory Visit: Payer: Self-pay | Admitting: Psychology

## 2018-09-10 ENCOUNTER — Other Ambulatory Visit: Payer: Self-pay | Admitting: Physical Medicine & Rehabilitation

## 2018-09-12 ENCOUNTER — Telehealth: Payer: Self-pay

## 2018-09-12 DIAGNOSIS — S066X1S Traumatic subarachnoid hemorrhage with loss of consciousness of 30 minutes or less, sequela: Secondary | ICD-10-CM

## 2018-09-12 MED ORDER — METHYLPHENIDATE HCL ER (OSM) 36 MG PO TBCR: 36.0000 mg | EXTENDED_RELEASE_TABLET | Freq: Every day | ORAL | 0 refills | Status: DC

## 2018-09-12 NOTE — Telephone Encounter (Signed)
Patient is calling in for his refills of concerta. Last refill was around 08-15-2018

## 2018-09-12 NOTE — Telephone Encounter (Signed)
Meds refilled.

## 2018-09-19 ENCOUNTER — Ambulatory Visit: Payer: Self-pay | Admitting: Psychology

## 2018-09-25 DIAGNOSIS — Z302 Encounter for sterilization: Secondary | ICD-10-CM | POA: Diagnosis not present

## 2018-09-26 ENCOUNTER — Encounter: Payer: BLUE CROSS/BLUE SHIELD | Admitting: Psychology

## 2018-10-14 ENCOUNTER — Telehealth: Payer: Self-pay

## 2018-10-14 NOTE — Telephone Encounter (Signed)
Pt called requesting refill on Concerta. Pt called pharmacy and they have a prescription on file to fill.

## 2018-10-17 ENCOUNTER — Encounter: Payer: Self-pay | Admitting: Psychology

## 2018-10-17 ENCOUNTER — Encounter: Payer: BLUE CROSS/BLUE SHIELD | Attending: Physical Medicine & Rehabilitation | Admitting: Psychology

## 2018-10-17 DIAGNOSIS — G44309 Post-traumatic headache, unspecified, not intractable: Secondary | ICD-10-CM | POA: Diagnosis not present

## 2018-10-17 DIAGNOSIS — Z87891 Personal history of nicotine dependence: Secondary | ICD-10-CM | POA: Diagnosis not present

## 2018-10-17 DIAGNOSIS — Z9889 Other specified postprocedural states: Secondary | ICD-10-CM | POA: Insufficient documentation

## 2018-10-17 DIAGNOSIS — S066X1S Traumatic subarachnoid hemorrhage with loss of consciousness of 30 minutes or less, sequela: Secondary | ICD-10-CM

## 2018-10-17 DIAGNOSIS — F068 Other specified mental disorders due to known physiological condition: Secondary | ICD-10-CM | POA: Diagnosis not present

## 2018-10-17 DIAGNOSIS — R4189 Other symptoms and signs involving cognitive functions and awareness: Secondary | ICD-10-CM | POA: Insufficient documentation

## 2018-10-17 DIAGNOSIS — F0630 Mood disorder due to known physiological condition, unspecified: Secondary | ICD-10-CM | POA: Diagnosis not present

## 2018-10-17 DIAGNOSIS — R561 Post traumatic seizures: Secondary | ICD-10-CM | POA: Diagnosis not present

## 2018-10-17 DIAGNOSIS — S069X0S Unspecified intracranial injury without loss of consciousness, sequela: Secondary | ICD-10-CM

## 2018-10-17 DIAGNOSIS — S069X9S Unspecified intracranial injury with loss of consciousness of unspecified duration, sequela: Secondary | ICD-10-CM

## 2018-10-17 DIAGNOSIS — S065X9S Traumatic subdural hemorrhage with loss of consciousness of unspecified duration, sequela: Secondary | ICD-10-CM | POA: Insufficient documentation

## 2018-10-17 DIAGNOSIS — Z09 Encounter for follow-up examination after completed treatment for conditions other than malignant neoplasm: Secondary | ICD-10-CM | POA: Insufficient documentation

## 2018-10-17 DIAGNOSIS — H53461 Homonymous bilateral field defects, right side: Secondary | ICD-10-CM | POA: Diagnosis not present

## 2018-10-17 DIAGNOSIS — Z8673 Personal history of transient ischemic attack (TIA), and cerebral infarction without residual deficits: Secondary | ICD-10-CM | POA: Insufficient documentation

## 2018-10-17 DIAGNOSIS — F39 Unspecified mood [affective] disorder: Secondary | ICD-10-CM | POA: Diagnosis not present

## 2018-10-17 NOTE — Progress Notes (Signed)
10/17/2018    Today the patient comes in reports that he is continuing to work on therapeutic interventions we have been developing.  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  While this is significantly below previous work level demands he had the patient is interacting with others and reports that he is handling the expectations adequately.  There are number of people working at [employer] that have various types of cognitive deficits and reports that it is quite positive for him to get out of the house and do things.  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  The patient is able to do this on his own time and the information and needed is fairly limited.  The patient reports that his mood is been quite good although he continues to have a lot of stressors in dealing with and managing issues related to home stressors.  The patient's attempts to try to help with childcare continue to be quite demanding form.

## 2018-11-12 DIAGNOSIS — Z0000 Encounter for general adult medical examination without abnormal findings: Secondary | ICD-10-CM | POA: Diagnosis not present

## 2018-11-12 DIAGNOSIS — Z6829 Body mass index (BMI) 29.0-29.9, adult: Secondary | ICD-10-CM | POA: Diagnosis not present

## 2018-11-12 DIAGNOSIS — Z13220 Encounter for screening for lipoid disorders: Secondary | ICD-10-CM | POA: Diagnosis not present

## 2018-11-12 DIAGNOSIS — Z125 Encounter for screening for malignant neoplasm of prostate: Secondary | ICD-10-CM | POA: Diagnosis not present

## 2018-11-14 ENCOUNTER — Telehealth: Payer: Self-pay

## 2018-11-14 DIAGNOSIS — Z6829 Body mass index (BMI) 29.0-29.9, adult: Secondary | ICD-10-CM | POA: Diagnosis not present

## 2018-11-14 DIAGNOSIS — Z0000 Encounter for general adult medical examination without abnormal findings: Secondary | ICD-10-CM | POA: Diagnosis not present

## 2018-11-14 DIAGNOSIS — H6122 Impacted cerumen, left ear: Secondary | ICD-10-CM | POA: Diagnosis not present

## 2018-11-14 MED ORDER — PROPRANOLOL HCL 20 MG PO TABS: 20.0000 mg | ORAL_TABLET | Freq: Two times a day (BID) | ORAL | 3 refills | Status: DC

## 2018-11-14 NOTE — Telephone Encounter (Signed)
Patient called stating that he was advised by you to not refill Propanolol after finishing prescription. He states he saw his PCP Dr. Ernie Hew today and she told him to stay on Propanolol but to call Dr. Naaman Plummer to see if he agrees. If patient is to stay on medication he needs a prescription called into [facility].

## 2018-11-14 NOTE — Telephone Encounter (Signed)
I don't recall telling him to stop it nor do I find it my notes. I have sent in new rx to [facility].

## 2018-11-19 ENCOUNTER — Telehealth: Payer: Self-pay | Admitting: *Deleted

## 2018-11-19 DIAGNOSIS — S066X1S Traumatic subarachnoid hemorrhage with loss of consciousness of 30 minutes or less, sequela: Secondary | ICD-10-CM

## 2018-11-19 NOTE — Telephone Encounter (Signed)
Albert [name] needs a refill on his Concerta.  He only has 4 pills left.

## 2018-11-19 NOTE — Telephone Encounter (Signed)
Contacted patient, left a voicemail indicating as well

## 2018-11-20 MED ORDER — METHYLPHENIDATE HCL ER (OSM) 36 MG PO TBCR: 36.0000 mg | EXTENDED_RELEASE_TABLET | Freq: Every day | ORAL | 0 refills | Status: DC

## 2018-11-20 NOTE — Telephone Encounter (Signed)
Mr Lamont notified.

## 2018-11-20 NOTE — Telephone Encounter (Signed)
2 mos of rx written

## 2018-11-28 ENCOUNTER — Encounter: Payer: BLUE CROSS/BLUE SHIELD | Admitting: Psychology

## 2018-12-16 ENCOUNTER — Telehealth: Payer: Self-pay | Admitting: Psychology

## 2018-12-16 NOTE — Telephone Encounter (Signed)
Patient's wife called needing a letter from you about an insurance claim.  Please call her at [phone].

## 2018-12-19 ENCOUNTER — Ambulatory Visit: Payer: Self-pay | Admitting: Psychology

## 2018-12-24 ENCOUNTER — Encounter: Payer: BLUE CROSS/BLUE SHIELD | Admitting: Psychology

## 2019-01-20 ENCOUNTER — Telehealth: Payer: Self-pay

## 2019-01-20 DIAGNOSIS — S066X1S Traumatic subarachnoid hemorrhage with loss of consciousness of 30 minutes or less, sequela: Secondary | ICD-10-CM

## 2019-01-20 MED ORDER — METHYLPHENIDATE HCL ER (OSM) 36 MG PO TBCR: 36.0000 mg | EXTENDED_RELEASE_TABLET | Freq: Every day | ORAL | 0 refills | Status: DC

## 2019-01-20 NOTE — Telephone Encounter (Signed)
concerta RF"d

## 2019-01-20 NOTE — Telephone Encounter (Signed)
Pt called requesting refill on Concerta. Last filled #30 on 12/23/2018, next appt 01/29/2019

## 2019-01-22 ENCOUNTER — Telehealth: Payer: Self-pay | Admitting: Psychology

## 2019-01-22 NOTE — Telephone Encounter (Signed)
Patient's wife is needing a letter from Dr. Sima Matas about patient having Alzheimer's, she said that you guys had discussed this in previous appointment.  Any questions please call her at [phone].

## 2019-01-29 ENCOUNTER — Ambulatory Visit: Payer: Self-pay | Admitting: Physical Medicine & Rehabilitation

## 2019-02-17 ENCOUNTER — Telehealth: Payer: Self-pay | Admitting: *Deleted

## 2019-02-17 DIAGNOSIS — S066X1S Traumatic subarachnoid hemorrhage with loss of consciousness of 30 minutes or less, sequela: Secondary | ICD-10-CM

## 2019-02-17 NOTE — Telephone Encounter (Signed)
Mr Rhoda called and says that he is needing a refill on his Concerta.  He has about 5-6 pills ledt but they are going out of town Friday and needs to fill the Rx before they leave. Last Rx was filled 01/20/19 and he has an appointment with Dr Naaman Plummer on 6/30 20

## 2019-02-18 DIAGNOSIS — Z0100 Encounter for examination of eyes and vision without abnormal findings: Secondary | ICD-10-CM | POA: Diagnosis not present

## 2019-02-18 DIAGNOSIS — I69398 Other sequelae of cerebral infarction: Secondary | ICD-10-CM | POA: Diagnosis not present

## 2019-02-18 DIAGNOSIS — H5347 Heteronymous bilateral field defects: Secondary | ICD-10-CM | POA: Diagnosis not present

## 2019-02-18 MED ORDER — METHYLPHENIDATE HCL ER (OSM) 36 MG PO TBCR: 36.0000 mg | EXTENDED_RELEASE_TABLET | Freq: Every day | ORAL | 0 refills | Status: DC

## 2019-02-18 NOTE — Telephone Encounter (Signed)
Notified. 

## 2019-02-18 NOTE — Telephone Encounter (Signed)
done

## 2019-03-11 ENCOUNTER — Encounter
Payer: BC Managed Care – PPO | Attending: Physical Medicine & Rehabilitation | Admitting: Physical Medicine & Rehabilitation

## 2019-03-11 ENCOUNTER — Encounter: Payer: Self-pay | Admitting: Physical Medicine & Rehabilitation

## 2019-03-11 ENCOUNTER — Other Ambulatory Visit: Payer: Self-pay

## 2019-03-11 ENCOUNTER — Other Ambulatory Visit: Payer: Self-pay | Admitting: Physical Medicine & Rehabilitation

## 2019-03-11 VITALS — BP 134/83 | HR 62 | Temp 97.5°F | Ht 72.0 in | Wt 220.6 lb

## 2019-03-11 DIAGNOSIS — S069X0S Unspecified intracranial injury without loss of consciousness, sequela: Secondary | ICD-10-CM

## 2019-03-11 DIAGNOSIS — Z79891 Long term (current) use of opiate analgesic: Secondary | ICD-10-CM | POA: Diagnosis not present

## 2019-03-11 DIAGNOSIS — F068 Other specified mental disorders due to known physiological condition: Secondary | ICD-10-CM | POA: Diagnosis not present

## 2019-03-11 DIAGNOSIS — G894 Chronic pain syndrome: Secondary | ICD-10-CM | POA: Diagnosis not present

## 2019-03-11 DIAGNOSIS — H53461 Homonymous bilateral field defects, right side: Secondary | ICD-10-CM | POA: Diagnosis not present

## 2019-03-11 DIAGNOSIS — Z5181 Encounter for therapeutic drug level monitoring: Secondary | ICD-10-CM | POA: Diagnosis not present

## 2019-03-11 DIAGNOSIS — S066X1S Traumatic subarachnoid hemorrhage with loss of consciousness of 30 minutes or less, sequela: Secondary | ICD-10-CM | POA: Insufficient documentation

## 2019-03-11 MED ORDER — METHYLPHENIDATE HCL ER (OSM) 36 MG PO TBCR: 36.0000 mg | EXTENDED_RELEASE_TABLET | Freq: Every day | ORAL | 0 refills | Status: DC

## 2019-03-11 NOTE — Patient Instructions (Addendum)
PLEASE FEEL FREE TO CALL OUR OFFICE WITH ANY PROBLEMS OR QUESTIONS ([phone])   IF YOU WANT TO TAPER THE CONCERTA WE CAN. JUST LET ME KNOW. WE CAN ALSO SWITCH TO SOMETHING WHICH IS CHEAPER BASED ON YOUR FORMULARY.

## 2019-03-11 NOTE — Progress Notes (Signed)
Subjective:    Patient ID: Albert [name], male    DOB: [birth year], 43 y.o.   MRN: [id number]  HPI   Pain Inventory Average Pain 0 Pain Right Now 0 My pain is sharp  In the last 24 hours, has pain interfered with the following? General activity 0 Relation with others 0 Enjoyment of life 0 What TIME of day is your pain at its worst? evening Sleep (in general) Fair  Pain is worse with: some activites Pain improves with: rest Relief from Meds: na  Mobility how many minutes can you walk? 40 ability to climb steps?  yes do you drive?  no  Function employed # of hrs/week 8-10 disabled: date disabled 2014  Neuro/Psych No problems in this area  Prior Studies Any changes since last visit?  no  Physicians involved in your care Any changes since last visit?  no   Family History  Problem Relation Age of Onset  . Heart disease Father    Social History   Socioeconomic History  . Marital status: Married    Spouse name: Not on file  . Number of children: Not on file  . Years of education: Not on file  . Highest education level: Not on file  Occupational History  . Not on file  Social Needs  . Financial resource strain: Not on file  . Food insecurity    Worry: Not on file    Inability: Not on file  . Transportation needs    Medical: Not on file    Non-medical: Not on file  Tobacco Use  . Smoking status: Former Smoker    Types: Cigarettes    Quit date: 12/17/1999    Years since quitting: 19.2  . Smokeless tobacco: Never Used  Substance and Sexual Activity  . Alcohol use: Yes  . Drug use: No  . Sexual activity: Not on file  Lifestyle  . Physical activity    Days per week: Not on file    Minutes per session: Not on file  . Stress: Not on file  Relationships  . Social Herbalist on phone: Not on file    Gets together: Not on file    Attends religious service: Not on file    Active member of club or organization: Not on file    Attends meetings  of clubs or organizations: Not on file    Relationship status: Not on file  Other Topics Concern  . Not on file  Social History Narrative  . Not on file   Past Surgical History:  Procedure Laterality Date  . CRANIOPLASTY N/A 06/10/2013   Procedure: CRANIOPLASTY;  Surgeon: Floyce Stakes, MD;  Location: [facility];  Service: Neurosurgery;  Laterality: N/A;  CRANIOPLASTY  . CRANIOTOMY Left 12/15/2012   Procedure: CRANIECTOMY HEMATOMA EVACUATION SUBDURAL WITH PLACEMENT OF BONE FLAP IN ABDOMINAL WALL;  Surgeon: Floyce Stakes, MD;  Location: [place] [facility];  Service: Neurosurgery;  Laterality: Left;  . CRANIOTOMY Left 12/28/2012   Procedure: CRANIOTOMY HEMATOMA EVACUATION SUBDURAL;  Surgeon: Erline Levine, MD;  Location: [place] [facility];  Service: Neurosurgery;  Laterality: Left;  . PEG PLACEMENT N/A 12/31/2012   Procedure: PERCUTANEOUS ENDOSCOPIC GASTROSTOMY (PEG) PLACEMENT;  Surgeon: Zenovia Jarred, MD;  Location: [place];  Service: General;  Laterality: N/A;  bedside  peg  . PERCUTANEOUS TRACHEOSTOMY N/A 12/31/2012   Procedure: PERCUTANEOUS TRACHEOSTOMY (BEDSIDE);  Surgeon: Zenovia Jarred, MD;  Location: [place];  Service: General;  Laterality:  N/A;  . WRIST SURGERY Right 2008   Past Medical History:  Diagnosis Date  . Headache(784.0)   . Rotator cuff injury    right  . Seizures ([place])    12/28/2012  . Stroke [facility])    some aphasia , weakness in the R arm , Out pt. rehab currently   . Swallowing problem    still relearning controlled swallowing - rec'ing speech therapy in rehab  . Traumatic brain injury ([place])    BP 134/83   Pulse 62   Temp (!) 97.5 F (36.4 C)   Ht 6' (1.829 m)   Wt 220 lb 9.6 oz (100.1 kg)   SpO2 96%   BMI 29.92 kg/m   Opioid Risk Score:   Fall Risk Score:  `1  Depression screen PHQ 2/9  Depression screen [facility] 2/9 03/11/2019 05/17/2016 04/30/2013  Decreased Interest 0 0 0  Down, Depressed, Hopeless 0 0 0  PHQ - 2 Score 0 0 0  Some recent data might be  hidden   Review of Systems  Constitutional: Negative.   HENT: Negative.   Eyes: Negative.   Respiratory: Negative.   Cardiovascular: Negative.   Gastrointestinal: Negative.   Endocrine: Negative.   Genitourinary: Negative.   Musculoskeletal: Negative.   Skin: Negative.   Allergic/Immunologic: Negative.   Neurological: Negative.   Hematological: Negative.   Psychiatric/Behavioral: Negative.   All other systems reviewed and are negative.      Objective:   Physical Exam Constitutional: No distress . Vital signs reviewed. HEENT: EOMI, oral membranes moist Neck: supple Cardiovascular: RRR without murmur. No JVD    Respiratory: CTA Bilaterally without wheezes or rales. Normal effort    GI: BS +, non-tender, non-distended  Extremities: no edema Skin:Clean and intact without signs of breakdown  Neuro:attention better, memory more functional with aids/organizews.   Right homonymous hemianopsia still present. Musculoskeletal: LB rom functional.  Psych:Pt's affect is dynamic and pleasant. Pt is cooperative. No irritability seen.    Assessment:  1. 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 Status post craniotomy evacuation of hematoma.Persistent ongoing cognitive deficitsas well as right visual field loss. He remains non-vocational given his ongoing deficits and inability to drive. He has improved from a standpoint of using compensatory strategies however. 2. Old right shoulder injury, likely RTC tendonitis-generally resolved 3. Seizure post- traumatic  4. Scrotal swelling, pain----resolved 5. Post-traumatic headaches---resolved    Plan:  1.Low dose propranolol for headache and mood---ongoing 2. Continue exelon3 mg twice daily as he feels that this helps with his memory. 3. Concerta for attention and memory. Second rx for next month. He has been compliant with the use of the Concerta. He may continue using this as  long as he feels it's helping. We can also consider tapering or using a different med We will continue the controlled substance monitoring program, this consists of regular clinic visits, examinations, routine drug screening, pill counts as well as use of [place] Controlled Substance Reporting System. [place] was reviewed today.  .  . -may call for RF in 2 months (early September) 4. Continue keppra 500mg  bid 5. Continuecommunity activity as possible  6. Unable to return to driving to righthomonymous hemianopsia.       Follow up with me in about23months.60minutes of face to face patient care time were spent during this visit. All questions were encouraged and answered.

## 2019-03-13 ENCOUNTER — Ambulatory Visit: Payer: BLUE CROSS/BLUE SHIELD | Admitting: Psychology

## 2019-03-13 LAB — TOXASSURE SELECT,+ANTIDEPR,UR

## 2019-03-15 ENCOUNTER — Other Ambulatory Visit: Payer: Self-pay | Admitting: Physical Medicine & Rehabilitation

## 2019-03-18 ENCOUNTER — Telehealth: Payer: Self-pay | Admitting: *Deleted

## 2019-03-18 NOTE — Telephone Encounter (Signed)
Urine drug screen for this encounter is consistent for prescribed medication ritalin/concerta.

## 2019-03-25 DIAGNOSIS — F411 Generalized anxiety disorder: Secondary | ICD-10-CM | POA: Diagnosis not present

## 2019-03-25 DIAGNOSIS — S069X0S Unspecified intracranial injury without loss of consciousness, sequela: Secondary | ICD-10-CM | POA: Diagnosis not present

## 2019-03-25 DIAGNOSIS — J309 Allergic rhinitis, unspecified: Secondary | ICD-10-CM | POA: Diagnosis not present

## 2019-04-03 ENCOUNTER — Encounter: Payer: BC Managed Care – PPO | Attending: Physical Medicine & Rehabilitation | Admitting: Psychology

## 2019-04-03 ENCOUNTER — Other Ambulatory Visit: Payer: Self-pay

## 2019-04-03 DIAGNOSIS — F068 Other specified mental disorders due to known physiological condition: Secondary | ICD-10-CM | POA: Diagnosis not present

## 2019-04-03 DIAGNOSIS — S069X0S Unspecified intracranial injury without loss of consciousness, sequela: Secondary | ICD-10-CM

## 2019-04-03 DIAGNOSIS — Z79891 Long term (current) use of opiate analgesic: Secondary | ICD-10-CM | POA: Diagnosis not present

## 2019-04-03 DIAGNOSIS — S066X1S Traumatic subarachnoid hemorrhage with loss of consciousness of 30 minutes or less, sequela: Secondary | ICD-10-CM | POA: Insufficient documentation

## 2019-04-03 DIAGNOSIS — G894 Chronic pain syndrome: Secondary | ICD-10-CM | POA: Insufficient documentation

## 2019-04-03 DIAGNOSIS — Z5181 Encounter for therapeutic drug level monitoring: Secondary | ICD-10-CM | POA: Diagnosis not present

## 2019-04-03 DIAGNOSIS — H53461 Homonymous bilateral field defects, right side: Secondary | ICD-10-CM | POA: Insufficient documentation

## 2019-04-04 ENCOUNTER — Other Ambulatory Visit: Payer: Self-pay

## 2019-04-04 DIAGNOSIS — S066X1S Traumatic subarachnoid hemorrhage with loss of consciousness of 30 minutes or less, sequela: Secondary | ICD-10-CM

## 2019-04-04 NOTE — Telephone Encounter (Signed)
Patient called today, requesting a refill of Concerta medication.  Verified in PMP that his last pharmacy fill date was 02/18/2019 and 01/20/2019.  Patients next appointment date is 09/10/2019.  Noticed in his medications listing that a note on the medication in question stated "do not fill before 04/20/2019"  Called over to pharmacy and verified that the patient did have 2 refills remaining already that have not been dispensed yet.    Called patient and informed him of findings.

## 2019-04-09 ENCOUNTER — Encounter: Payer: Self-pay | Admitting: Psychology

## 2019-04-09 NOTE — Progress Notes (Signed)
04/03/2019: Today was an in person visit that occurred in my outpatient clinic office with the patient and myself present.  The patient was well oriented and reported that he has been continuing to try to do as much as he can around the house.  The patient has not been able to work recently because of COVID-19 but even when he was working and was very much diminished cognitive requiring job.  There have been concerns about issues of the type of disability there has and we reviewed the issue regarding his cognitive deficits.  There was a period of time during this visit where his wife was connected via speaker phone where we also reviewed issues.  The patient reports that his mood is been pretty stable although he does look forward to being able to do some type of work beyond trying to help out at home.  We reviewed issues regarding psychosocial stressors at home and how he is coping and managing with these.

## 2019-05-01 ENCOUNTER — Other Ambulatory Visit: Payer: Self-pay

## 2019-05-01 ENCOUNTER — Encounter: Payer: BC Managed Care – PPO | Attending: Physical Medicine & Rehabilitation | Admitting: Psychology

## 2019-05-01 ENCOUNTER — Encounter

## 2019-05-01 DIAGNOSIS — F068 Other specified mental disorders due to known physiological condition: Secondary | ICD-10-CM | POA: Insufficient documentation

## 2019-05-01 DIAGNOSIS — H53461 Homonymous bilateral field defects, right side: Secondary | ICD-10-CM | POA: Insufficient documentation

## 2019-05-01 DIAGNOSIS — Z5181 Encounter for therapeutic drug level monitoring: Secondary | ICD-10-CM | POA: Diagnosis not present

## 2019-05-01 DIAGNOSIS — Z79891 Long term (current) use of opiate analgesic: Secondary | ICD-10-CM | POA: Insufficient documentation

## 2019-05-01 DIAGNOSIS — G894 Chronic pain syndrome: Secondary | ICD-10-CM | POA: Insufficient documentation

## 2019-05-01 DIAGNOSIS — S069X0S Unspecified intracranial injury without loss of consciousness, sequela: Secondary | ICD-10-CM | POA: Insufficient documentation

## 2019-05-01 DIAGNOSIS — S066X1S Traumatic subarachnoid hemorrhage with loss of consciousness of 30 minutes or less, sequela: Secondary | ICD-10-CM | POA: Insufficient documentation

## 2019-05-05 ENCOUNTER — Encounter: Payer: Self-pay | Admitting: Psychology

## 2019-05-05 NOTE — Progress Notes (Signed)
05/02/19:  Patient continues to struggle with complications from his TBI.  Patient dealing with two young children that are at home for school now and there continue to be stressors between pt and wife with his changes is function.  Worked on coping and adjustment issues.

## 2019-05-06 ENCOUNTER — Telehealth: Payer: Self-pay

## 2019-05-06 DIAGNOSIS — S066X1S Traumatic subarachnoid hemorrhage with loss of consciousness of 30 minutes or less, sequela: Secondary | ICD-10-CM

## 2019-05-06 NOTE — Telephone Encounter (Signed)
Patient called requesting refill on Concerta. Last filled 04/04/2019 #30.

## 2019-05-07 MED ORDER — METHYLPHENIDATE HCL ER (OSM) 36 MG PO TBCR: 36.0000 mg | EXTENDED_RELEASE_TABLET | Freq: Every day | ORAL | 0 refills | Status: DC

## 2019-05-07 NOTE — Telephone Encounter (Signed)
RF'ed but should have had a DNF b/f "8/6" rx too. Remind him this time that i've also written an rx for 9/24. thx

## 2019-05-08 NOTE — Telephone Encounter (Signed)
I notified Mr Albert [name]. He said it was because the pharmacy told him when he called for refill he needed a new order. (they frequently tell patients that and do not look for the order held in their system). I have reminded him there will be 2 months rx there and he may have to go and ask them in person to fill it.

## 2019-05-13 DIAGNOSIS — Z23 Encounter for immunization: Secondary | ICD-10-CM | POA: Diagnosis not present

## 2019-06-02 ENCOUNTER — Ambulatory Visit: Payer: BLUE CROSS/BLUE SHIELD | Admitting: Psychology

## 2019-06-09 ENCOUNTER — Other Ambulatory Visit: Payer: Self-pay | Admitting: Physical Medicine & Rehabilitation

## 2019-06-12 ENCOUNTER — Encounter: Payer: BC Managed Care – PPO | Attending: Physical Medicine & Rehabilitation | Admitting: Psychology

## 2019-06-12 ENCOUNTER — Encounter

## 2019-06-12 ENCOUNTER — Other Ambulatory Visit: Payer: Self-pay

## 2019-06-12 DIAGNOSIS — S069X0S Unspecified intracranial injury without loss of consciousness, sequela: Secondary | ICD-10-CM

## 2019-06-12 DIAGNOSIS — S066X1S Traumatic subarachnoid hemorrhage with loss of consciousness of 30 minutes or less, sequela: Secondary | ICD-10-CM | POA: Insufficient documentation

## 2019-06-12 DIAGNOSIS — F068 Other specified mental disorders due to known physiological condition: Secondary | ICD-10-CM | POA: Insufficient documentation

## 2019-06-12 DIAGNOSIS — G894 Chronic pain syndrome: Secondary | ICD-10-CM | POA: Diagnosis not present

## 2019-06-12 DIAGNOSIS — R4189 Other symptoms and signs involving cognitive functions and awareness: Secondary | ICD-10-CM

## 2019-06-12 DIAGNOSIS — Z79891 Long term (current) use of opiate analgesic: Secondary | ICD-10-CM | POA: Diagnosis not present

## 2019-06-12 DIAGNOSIS — Z5181 Encounter for therapeutic drug level monitoring: Secondary | ICD-10-CM | POA: Insufficient documentation

## 2019-06-12 DIAGNOSIS — H53461 Homonymous bilateral field defects, right side: Secondary | ICD-10-CM | POA: Insufficient documentation

## 2019-06-26 ENCOUNTER — Encounter: Payer: Self-pay | Admitting: Psychology

## 2019-06-26 NOTE — Progress Notes (Signed)
06/12/2019.  Today's visit was a 1 hour visit that was conducted in my outpatient clinic office with the patient myself present for this visit.  The visit was very constructive and we were able to review a number of issues related to continuing adjusting to the residual effects of his traumatic brain injury and its effect on his ability to work and maintain cognitive functioning and help out around the house.  The patient continues to have difficulties that are exacerbated by the COVID-19 school restrictions for his children but he is continuing to adjust and doing much better at home.  The patient reports that his mood is been stable and while there are times where he is frustrated with various aspects he is coping generally with these issues.

## 2019-07-31 ENCOUNTER — Telehealth: Payer: Self-pay

## 2019-07-31 DIAGNOSIS — S066X1S Traumatic subarachnoid hemorrhage with loss of consciousness of 30 minutes or less, sequela: Secondary | ICD-10-CM

## 2019-07-31 MED ORDER — METHYLPHENIDATE HCL ER (OSM) 36 MG PO TBCR: 36.0000 mg | EXTENDED_RELEASE_TABLET | Freq: Every day | ORAL | 0 refills | Status: DC

## 2019-07-31 NOTE — Telephone Encounter (Signed)
Patient called stating needs a refill on Concerta, no prescription is at the pharmacy. I called the pharmacy to confirm. Last filled 07/04/2019, next appt 09/10/2019. He states he only has #4 left

## 2019-07-31 NOTE — Telephone Encounter (Signed)
Filled for this month and next

## 2019-07-31 NOTE — Telephone Encounter (Signed)
Notified Albert [name].

## 2019-08-28 ENCOUNTER — Encounter: Payer: BC Managed Care – PPO | Attending: Physical Medicine & Rehabilitation | Admitting: Psychology

## 2019-08-28 ENCOUNTER — Other Ambulatory Visit: Payer: Self-pay

## 2019-08-28 DIAGNOSIS — S069X0S Unspecified intracranial injury without loss of consciousness, sequela: Secondary | ICD-10-CM | POA: Diagnosis not present

## 2019-08-28 DIAGNOSIS — H53461 Homonymous bilateral field defects, right side: Secondary | ICD-10-CM | POA: Insufficient documentation

## 2019-08-28 DIAGNOSIS — S066X1S Traumatic subarachnoid hemorrhage with loss of consciousness of 30 minutes or less, sequela: Secondary | ICD-10-CM | POA: Diagnosis not present

## 2019-08-28 DIAGNOSIS — G894 Chronic pain syndrome: Secondary | ICD-10-CM | POA: Diagnosis not present

## 2019-08-28 DIAGNOSIS — Z5181 Encounter for therapeutic drug level monitoring: Secondary | ICD-10-CM | POA: Insufficient documentation

## 2019-08-28 DIAGNOSIS — F0630 Mood disorder due to known physiological condition, unspecified: Secondary | ICD-10-CM

## 2019-08-28 DIAGNOSIS — S069X9S Unspecified intracranial injury with loss of consciousness of unspecified duration, sequela: Secondary | ICD-10-CM

## 2019-08-28 DIAGNOSIS — Z79891 Long term (current) use of opiate analgesic: Secondary | ICD-10-CM | POA: Insufficient documentation

## 2019-08-28 DIAGNOSIS — F068 Other specified mental disorders due to known physiological condition: Secondary | ICD-10-CM | POA: Diagnosis not present

## 2019-09-07 ENCOUNTER — Other Ambulatory Visit: Payer: Self-pay | Admitting: Physical Medicine & Rehabilitation

## 2019-09-10 ENCOUNTER — Encounter: Payer: Self-pay | Admitting: Physical Medicine & Rehabilitation

## 2019-09-10 ENCOUNTER — Encounter (HOSPITAL_BASED_OUTPATIENT_CLINIC_OR_DEPARTMENT_OTHER): Payer: BC Managed Care – PPO | Admitting: Physical Medicine & Rehabilitation

## 2019-09-10 ENCOUNTER — Other Ambulatory Visit: Payer: Self-pay

## 2019-09-10 DIAGNOSIS — S069X0S Unspecified intracranial injury without loss of consciousness, sequela: Secondary | ICD-10-CM | POA: Diagnosis not present

## 2019-09-10 DIAGNOSIS — Z79891 Long term (current) use of opiate analgesic: Secondary | ICD-10-CM | POA: Diagnosis not present

## 2019-09-10 DIAGNOSIS — S066X1S Traumatic subarachnoid hemorrhage with loss of consciousness of 30 minutes or less, sequela: Secondary | ICD-10-CM | POA: Diagnosis not present

## 2019-09-10 DIAGNOSIS — Z5181 Encounter for therapeutic drug level monitoring: Secondary | ICD-10-CM | POA: Diagnosis not present

## 2019-09-10 DIAGNOSIS — H53461 Homonymous bilateral field defects, right side: Secondary | ICD-10-CM | POA: Diagnosis not present

## 2019-09-10 DIAGNOSIS — F068 Other specified mental disorders due to known physiological condition: Secondary | ICD-10-CM | POA: Diagnosis not present

## 2019-09-10 DIAGNOSIS — G894 Chronic pain syndrome: Secondary | ICD-10-CM | POA: Diagnosis not present

## 2019-09-10 MED ORDER — METHYLPHENIDATE HCL ER (OSM) 36 MG PO TBCR: 36.0000 mg | EXTENDED_RELEASE_TABLET | Freq: Every day | ORAL | 0 refills | Status: DC

## 2019-09-10 NOTE — Progress Notes (Signed)
Subjective:    Patient ID: Albert [name], male    DOB: 06-09-1976, 43 y.o.   MRN: [id number]  HPI   Albert [name] is here in follow up of his TBI. He is tired of being couped up from covid but is dealing with it the best they can.   He denies any new pains. He has not experienced any changes with his cognition. He still struggles with STM and concentration.   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    Pain Inventory Average Pain 0 Pain Right Now 0 My pain is sharp  In the last 24 hours, has pain interfered with the following? General activity 0 Relation with others 0 Enjoyment of life 0 What TIME of day is your pain at its worst? evening, night Sleep (in general) Fair  Pain is worse with: unsure Pain improves with: rest and medication Relief from Meds: 4  Mobility walk without assistance ability to climb steps?  yes do you drive?  no Do you have any goals in this area?  no  Function employed # of hrs/week . disabled: date disabled . Do you have any goals in this area?  yes  Neuro/Psych No problems in this area  Prior Studies Any changes since last visit?  no  Physicians involved in your care Any changes since last visit?  no   Family History  Problem Relation Age of Onset  . Heart disease Father    Social History   Socioeconomic History  . Marital status: Married    Spouse name: Not on file  . Number of children: Not on file  . Years of education: Not on file  . Highest education level: Not on file  Occupational History  . Not on file  Tobacco Use  . Smoking status: Former Smoker    Types: Cigarettes    Quit date: 12/17/1999    Years since quitting: 19.7  . Smokeless tobacco: Never Used  Substance and Sexual Activity  . Alcohol use: Yes  . Drug use: No  . Sexual activity: Not on file  Other Topics Concern  . Not on file  Social History Narrative  . Not on file   Social Determinants of Health   Financial  Resource Strain:   . Difficulty of Paying Living Expenses: Not on file  Food Insecurity:   . Worried About Charity fundraiser in the Last Year: Not on file  . Ran Out of Food in the Last Year: Not on file  Transportation Needs:   . Lack of Transportation (Medical): Not on file  . Lack of Transportation (Non-Medical): Not on file  Physical Activity:   . Days of Exercise per Week: Not on file  . Minutes of Exercise per Session: Not on file  Stress:   . Feeling of Stress : Not on file  Social Connections:   . Frequency of Communication with Friends and Family: Not on file  . Frequency of Social Gatherings with Friends and Family: Not on file  . Attends Religious Services: Not on file  . Active Member of Clubs or Organizations: Not on file  . Attends Archivist Meetings: Not on file  . Marital Status: Not on file   Past Surgical History:  Procedure Laterality Date  . CRANIOPLASTY N/A 06/10/2013   Procedure: CRANIOPLASTY;  Surgeon: Floyce Stakes, MD;  Location: [facility];  Service: Neurosurgery;  Laterality: N/A;  CRANIOPLASTY  .  CRANIOTOMY Left 12/15/2012   Procedure: CRANIECTOMY HEMATOMA EVACUATION SUBDURAL WITH PLACEMENT OF BONE FLAP IN ABDOMINAL WALL;  Surgeon: Floyce Stakes, MD;  Location: [place] [facility];  Service: Neurosurgery;  Laterality: Left;  . CRANIOTOMY Left 12/28/2012   Procedure: CRANIOTOMY HEMATOMA EVACUATION SUBDURAL;  Surgeon: Erline Levine, MD;  Location: [place] NEURO ORS;  Service: Neurosurgery;  Laterality: Left;  . PEG PLACEMENT N/A 12/31/2012   Procedure: PERCUTANEOUS ENDOSCOPIC GASTROSTOMY (PEG) PLACEMENT;  Surgeon: Zenovia Jarred, MD;  Location: [place];  Service: General;  Laterality: N/A;  bedside  peg  . PERCUTANEOUS TRACHEOSTOMY N/A 12/31/2012   Procedure: PERCUTANEOUS TRACHEOSTOMY (BEDSIDE);  Surgeon: Zenovia Jarred, MD;  Location: [place];  Service: General;  Laterality: N/A;  . WRIST SURGERY Right 2008   Past Medical History:  Diagnosis  Date  . Headache(784.0)   . Rotator cuff injury    right  . Seizures ([place])    12/28/2012  . Stroke [facility])    some aphasia , weakness in the R arm , Out pt. rehab currently   . Swallowing problem    still relearning controlled swallowing - rec'ing speech therapy in rehab  . Traumatic brain injury ([place])    BP 136/89   Pulse 69   Temp 97.9 F (36.6 C)   Ht 6' (1.829 m)   Wt 217 lb (98.4 kg)   SpO2 96%   BMI 29.43 kg/m   Opioid Risk Score:   Fall Risk Score:  `1  Depression screen PHQ 2/9  Depression screen [facility] 2/9 03/11/2019 05/17/2016 04/30/2013  Decreased Interest 0 0 0  Down, Depressed, Hopeless 0 0 0  PHQ - 2 Score 0 0 0  Some recent data might be hidden    Review of Systems  Constitutional: Negative.   HENT: Negative.   Eyes: Negative.   Respiratory: Negative.   Cardiovascular: Negative.   Gastrointestinal: Negative.   Endocrine: Negative.   Genitourinary: Negative.   Musculoskeletal: Negative.   Skin: Negative.   Allergic/Immunologic: Negative.   Neurological: Positive for headaches.  Hematological: Negative.   Psychiatric/Behavioral: Negative.   All other systems reviewed and are negative.      Objective:   Physical Exam  General: No acute distress HEENT: EOMI, oral membranes moist Cards: reg rate  Chest: normal effort Abdomen: Soft, NT, ND Skin: dry, intact Extremities: no edema  Neuro:STM deficits, concentration deficits. Uses phone/tape recorder for organization.  Right homonymous hemianopsia still present.  Musculoskeletal: LB rom functional. Psych:Pt's affect is dynamic and pleasant. Pt is cooperative. No irritability seen.    Assessment:  1. 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 Status post craniotomy evacuation of hematoma.Persistent ongoing cognitive deficitsas well as right visual field loss. He remains non-vocational given his ongoing deficits and inability to drive. He has  improved from a standpoint of using compensatory strategies however. 2. Old right shoulder injury, likely RTC tendonitis-generally resolved 3. Seizure post- traumatic  4. Scrotal swelling, pain----resolved 5. Post-traumatic headaches---resolved  2  Plan:  1.Low dose propranolol for headache and mood---this has been effective 2. Continue exelon3 mg twice daily as he feels that this helps with his memory.consider increase to 6mg . He should discuss with his wife 3. Concerta for attention and memory. Second rx for next month. He has been compliant with the use of the Concerta. He may continue using this as long as he feels it's helping. We can also consider tapering or using a different med We will continue the controlled  substance monitoring program, this consists of regular clinic visits, examinations, routine drug screening, pill counts as well as use of [place] Controlled Substance Reporting System. [place]CC[place] was [place] today.   -may call for RF in 2 months 4. Continue keppra 500mg  bid 5. Continuecommunity activity as possible given COVID 6. Unable to return to driving to righthomonymous hemianopsia.    Fifteen minutes of face to face patient care time were spent during this visit. All questions were encouraged and answered.  Follow up with me in 4 months .

## 2019-09-10 NOTE — Patient Instructions (Addendum)
PLEASE FEEL FREE TO CALL OUR OFFICE WITH ANY PROBLEMS OR QUESTIONS [phone]  DO YOU FEEL THAT THE EXELON IS HELPING WITH YOUR MEMORY?   IF NOT, WE CAN INCREASE TO 6MG ---call me to let me know.  OR WE COULD JUST STOP IT COMPLETELY.

## 2019-09-11 ENCOUNTER — Encounter: Payer: Self-pay | Admitting: Psychology

## 2019-09-11 NOTE — Progress Notes (Signed)
08/28/2019.  Today's visit was a 1 hour in person visit that was conducted in my outpatient clinic office with my self and the patient present for this visit.  Today's visit was focused on building coping skills and strategies around residual deficits from his traumatic brain injury.  The patient was very active and this was a very constructive visit where we were able to review a great deal of stressors and difficulties that are complicating his coping and adjustment.  We continue to work on mood stability and coping with stressors within his family.

## 2019-09-18 DIAGNOSIS — F068 Other specified mental disorders due to known physiological condition: Secondary | ICD-10-CM

## 2019-09-18 DIAGNOSIS — S066X1S Traumatic subarachnoid hemorrhage with loss of consciousness of 30 minutes or less, sequela: Secondary | ICD-10-CM

## 2019-09-18 DIAGNOSIS — S069X0S Unspecified intracranial injury without loss of consciousness, sequela: Secondary | ICD-10-CM

## 2019-09-18 NOTE — Telephone Encounter (Signed)
Message from patient

## 2019-09-19 MED ORDER — RIVASTIGMINE TARTRATE 6 MG PO CAPS: 6.0000 mg | ORAL_CAPSULE | Freq: Two times a day (BID) | ORAL | 5 refills | Status: DC

## 2019-09-19 NOTE — Telephone Encounter (Signed)
Exelon increased to 6mg  bid per pt request

## 2019-09-30 ENCOUNTER — Other Ambulatory Visit: Payer: Self-pay

## 2019-09-30 ENCOUNTER — Encounter: Payer: BC Managed Care – PPO | Attending: Physical Medicine & Rehabilitation | Admitting: Psychology

## 2019-09-30 DIAGNOSIS — G894 Chronic pain syndrome: Secondary | ICD-10-CM | POA: Diagnosis not present

## 2019-09-30 DIAGNOSIS — Z5181 Encounter for therapeutic drug level monitoring: Secondary | ICD-10-CM | POA: Insufficient documentation

## 2019-09-30 DIAGNOSIS — S069X0S Unspecified intracranial injury without loss of consciousness, sequela: Secondary | ICD-10-CM

## 2019-09-30 DIAGNOSIS — H53461 Homonymous bilateral field defects, right side: Secondary | ICD-10-CM | POA: Insufficient documentation

## 2019-09-30 DIAGNOSIS — S066X1S Traumatic subarachnoid hemorrhage with loss of consciousness of 30 minutes or less, sequela: Secondary | ICD-10-CM | POA: Diagnosis not present

## 2019-09-30 DIAGNOSIS — F068 Other specified mental disorders due to known physiological condition: Secondary | ICD-10-CM

## 2019-09-30 DIAGNOSIS — Z79891 Long term (current) use of opiate analgesic: Secondary | ICD-10-CM | POA: Insufficient documentation

## 2019-09-30 NOTE — Progress Notes (Signed)
09/30/2019:  Today's visit was a 1 hour in person visit that was conducted in my outpatient clinic office.  The patient and myself were present for this visit.  We continue to work on building coping skills and strategies around residual deficits of his traumatic brain injury.  We looked at psychosocial variables that were causing stressors including situations at home as well as his return to work following COVID-19 changes in the difficulties he has with regard to cognitive functioning but is successful ability to do new types of activities at work.  The patient is working as a Market researcher at [employer] and while he is enjoying being able to get out to work there is frustration about the limitations in his cognitive functioning to do more demanding types of jobs.

## 2019-10-31 ENCOUNTER — Telehealth: Payer: Self-pay

## 2019-10-31 DIAGNOSIS — S066X1S Traumatic subarachnoid hemorrhage with loss of consciousness of 30 minutes or less, sequela: Secondary | ICD-10-CM

## 2019-10-31 MED ORDER — METHYLPHENIDATE HCL ER (OSM) 36 MG PO TBCR: 36.0000 mg | EXTENDED_RELEASE_TABLET | Freq: Every day | ORAL | 0 refills | Status: DC

## 2019-10-31 NOTE — Telephone Encounter (Signed)
rf's sent for feb and mar

## 2019-10-31 NOTE — Telephone Encounter (Signed)
Patient called in a refill of methylphenidate medication.  Next appointment is scheduled on 01-07-2020. According to PMP, last prescriptions were dispensed on 10-01-2019 and 08-28-2019.

## 2019-11-04 ENCOUNTER — Encounter (INDEPENDENT_AMBULATORY_CARE_PROVIDER_SITE_OTHER): Payer: Self-pay

## 2019-11-14 DIAGNOSIS — Z125 Encounter for screening for malignant neoplasm of prostate: Secondary | ICD-10-CM | POA: Diagnosis not present

## 2019-11-14 DIAGNOSIS — Z0184 Encounter for antibody response examination: Secondary | ICD-10-CM | POA: Diagnosis not present

## 2019-11-14 DIAGNOSIS — Z0000 Encounter for general adult medical examination without abnormal findings: Secondary | ICD-10-CM | POA: Diagnosis not present

## 2019-11-14 DIAGNOSIS — Z20828 Contact with and (suspected) exposure to other viral communicable diseases: Secondary | ICD-10-CM | POA: Diagnosis not present

## 2019-11-18 DIAGNOSIS — I10 Essential (primary) hypertension: Secondary | ICD-10-CM | POA: Diagnosis not present

## 2019-11-18 DIAGNOSIS — Z6829 Body mass index (BMI) 29.0-29.9, adult: Secondary | ICD-10-CM | POA: Diagnosis not present

## 2019-11-18 DIAGNOSIS — G4700 Insomnia, unspecified: Secondary | ICD-10-CM | POA: Diagnosis not present

## 2019-11-18 DIAGNOSIS — Z0000 Encounter for general adult medical examination without abnormal findings: Secondary | ICD-10-CM | POA: Diagnosis not present

## 2019-11-18 DIAGNOSIS — E782 Mixed hyperlipidemia: Secondary | ICD-10-CM | POA: Diagnosis not present

## 2019-11-21 ENCOUNTER — Telehealth: Payer: Self-pay

## 2019-11-21 NOTE — Telephone Encounter (Signed)
Patient called stating that he was prescribed medication by another doctor and need approval to take. Called patient back and medication is Olmesartan Medoxomil  20 mg, Rosuvastatin 5 mg given by PCP. He would like to make sure these medications are ok to take with his injury along with the other medications he already takes.

## 2019-11-22 ENCOUNTER — Other Ambulatory Visit: Payer: Self-pay | Admitting: Physical Medicine & Rehabilitation

## 2019-11-24 NOTE — Telephone Encounter (Signed)
They are fine to take with his current meds.

## 2019-11-25 NOTE — Telephone Encounter (Signed)
Patient notified

## 2019-11-27 ENCOUNTER — Other Ambulatory Visit: Payer: Self-pay

## 2019-11-27 ENCOUNTER — Encounter: Payer: Self-pay | Admitting: Physician Assistant

## 2019-11-27 ENCOUNTER — Ambulatory Visit (INDEPENDENT_AMBULATORY_CARE_PROVIDER_SITE_OTHER): Payer: BC Managed Care – PPO | Admitting: Physician Assistant

## 2019-11-27 DIAGNOSIS — D485 Neoplasm of uncertain behavior of skin: Secondary | ICD-10-CM | POA: Diagnosis not present

## 2019-11-27 DIAGNOSIS — D1809 Hemangioma of other sites: Secondary | ICD-10-CM | POA: Diagnosis not present

## 2019-11-27 NOTE — Patient Instructions (Signed)

## 2019-11-27 NOTE — Progress Notes (Addendum)
    Follow up Visit  Subjective  Albert [name] is a 44 y.o. male who presents for the following: Cyst (Right chestx 2weeks-no pain, just wants to know what it is). Red spot on right chest. Came up about 1 month ago. Has continued to get larger. Patient did scratch it one day and it started bleeding and he had trouble getting it to stop.   Objective  Well appearing patient in no apparent distress; mood and affect are within normal limits.  A focused examination was performed including chest, back, arms, shoulder, neck.. Relevant physical exam findings are noted in the Assessment and Plan. No suspicious moles noted on back.  Objective  Right Breast: Large red papule. 2.5cm-mole     Assessment & Plan  Neoplasm of uncertain behavior of skin Right Breast  Skin / nail biopsy Type of biopsy: tangential   Informed consent: discussed and consent obtained   Procedure prep:  Patient was prepped and draped in usual sterile fashion (Non sterile) Prep type:  Chlorhexidine Anesthesia: the lesion was anesthetized in a standard fashion   Anesthetic:  1% lidocaine w/ epinephrine 1-100,000 local infiltration Instrument used: flexible razor blade    Specimen 1 - Surgical pathology Differential Diagnosis: bcc,scc , angioma Check Margins: No  I, Verlyn Dannenberg Clark-Bruning, PA-C, have reviewed all documentation for this visit. The documentation on 01/14/20 for the exam, diagnosis, procedures, and orders are all accurate and complete.

## 2019-12-01 ENCOUNTER — Telehealth: Payer: Self-pay | Admitting: *Deleted

## 2019-12-01 ENCOUNTER — Encounter: Payer: Self-pay | Admitting: *Deleted

## 2019-12-01 NOTE — Telephone Encounter (Signed)
Request and image from patient.

## 2019-12-01 NOTE — Telephone Encounter (Signed)
Path report to patient. Patient is to call if needed.

## 2019-12-02 ENCOUNTER — Other Ambulatory Visit: Payer: Self-pay | Admitting: Physical Medicine & Rehabilitation

## 2019-12-02 NOTE — Telephone Encounter (Signed)
This encounter was created in error - please disregard.

## 2019-12-11 ENCOUNTER — Ambulatory Visit: Payer: BC Managed Care – PPO | Admitting: Psychology

## 2019-12-16 DIAGNOSIS — I10 Essential (primary) hypertension: Secondary | ICD-10-CM | POA: Diagnosis not present

## 2019-12-16 DIAGNOSIS — Z719 Counseling, unspecified: Secondary | ICD-10-CM | POA: Diagnosis not present

## 2019-12-16 DIAGNOSIS — E782 Mixed hyperlipidemia: Secondary | ICD-10-CM | POA: Diagnosis not present

## 2019-12-16 DIAGNOSIS — I639 Cerebral infarction, unspecified: Secondary | ICD-10-CM | POA: Diagnosis not present

## 2019-12-26 ENCOUNTER — Telehealth: Payer: Self-pay | Admitting: *Deleted

## 2019-12-26 DIAGNOSIS — S066X1S Traumatic subarachnoid hemorrhage with loss of consciousness of 30 minutes or less, sequela: Secondary | ICD-10-CM

## 2019-12-26 MED ORDER — METHYLPHENIDATE HCL ER (OSM) 36 MG PO TBCR: 36.0000 mg | EXTENDED_RELEASE_TABLET | Freq: Every day | ORAL | 0 refills | Status: DC

## 2019-12-26 NOTE — Telephone Encounter (Signed)
Refill sent to pharmacy.   

## 2019-12-26 NOTE — Telephone Encounter (Signed)
Mr Albert [name] called to request refill on his Concerta.last filled 11/28/19, next appt is 4/28 21.

## 2020-01-07 ENCOUNTER — Encounter
Payer: BC Managed Care – PPO | Attending: Physical Medicine & Rehabilitation | Admitting: Physical Medicine & Rehabilitation

## 2020-01-07 ENCOUNTER — Encounter: Payer: Self-pay | Admitting: Physical Medicine & Rehabilitation

## 2020-01-07 ENCOUNTER — Other Ambulatory Visit: Payer: Self-pay

## 2020-01-07 DIAGNOSIS — Z79891 Long term (current) use of opiate analgesic: Secondary | ICD-10-CM | POA: Insufficient documentation

## 2020-01-07 DIAGNOSIS — S066X1S Traumatic subarachnoid hemorrhage with loss of consciousness of 30 minutes or less, sequela: Secondary | ICD-10-CM | POA: Insufficient documentation

## 2020-01-07 DIAGNOSIS — H53461 Homonymous bilateral field defects, right side: Secondary | ICD-10-CM | POA: Insufficient documentation

## 2020-01-07 DIAGNOSIS — F068 Other specified mental disorders due to known physiological condition: Secondary | ICD-10-CM | POA: Insufficient documentation

## 2020-01-07 DIAGNOSIS — Z5181 Encounter for therapeutic drug level monitoring: Secondary | ICD-10-CM | POA: Insufficient documentation

## 2020-01-07 DIAGNOSIS — G894 Chronic pain syndrome: Secondary | ICD-10-CM | POA: Diagnosis not present

## 2020-01-07 DIAGNOSIS — S069X0S Unspecified intracranial injury without loss of consciousness, sequela: Secondary | ICD-10-CM

## 2020-01-07 MED ORDER — RIVASTIGMINE TARTRATE 6 MG PO CAPS: 6.0000 mg | ORAL_CAPSULE | Freq: Two times a day (BID) | ORAL | 5 refills | Status: DC

## 2020-01-07 MED ORDER — METHYLPHENIDATE HCL ER (OSM) 36 MG PO TBCR: 36.0000 mg | EXTENDED_RELEASE_TABLET | Freq: Every day | ORAL | 0 refills | Status: DC

## 2020-01-07 NOTE — Progress Notes (Signed)
Subjective:    Patient ID: Albert [name], male    DOB: Sep 22, 1975, 43 y.o.   MRN: [id number]  HPI   Albert [name] is here in follow up of his TBI. 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 He likes being around people.  He feels that the increase in exelon has helped his memory, especially when he works. It may have bumped his bp slightly, but generally bp has been under good control.   [name] remains very involved with his family. He has a son and daughter both of whom he helps with at home.     Pain Inventory Average Pain 1 Pain Right Now 0 My pain is sharp  In the last 24 hours, has pain interfered with the following? General activity 0 Relation with others 0 Enjoyment of life 0 What TIME of day is your pain at its worst? evening Sleep (in general) Fair  Pain is worse with: bending and inactivity Pain improves with: music Relief from Meds: 0  Mobility walk without assistance how many minutes can you walk? 60+ ability to climb steps?  yes do you drive?  no  Function employed # of hrs/week 10  Neuro/Psych numbness tingling  Prior Studies Any changes since last visit?  no  Physicians involved in your care Any changes since last visit?  no   Family History  Problem Relation Age of Onset  . Heart disease Father    Social History   Socioeconomic History  . Marital status: Married    Spouse name: Not on file  . Number of children: Not on file  . Years of education: Not on file  . Highest education level: Not on file  Occupational History  . Not on file  Tobacco Use  . Smoking status: Former Smoker    Types: Cigarettes    Quit date: 12/17/1999    Years since quitting: 20.0  . Smokeless tobacco: Never Used  Substance and Sexual Activity  . Alcohol use: Yes  . Drug use: No  . Sexual activity: Not on file  Other Topics Concern  . Not on file  Social History Narrative  . Not on file    Social Determinants of Health   Financial Resource Strain:   . Difficulty of Paying Living Expenses:   Food Insecurity:   . Worried About Charity fundraiser in the Last Year:   . Arboriculturist in the Last Year:   Transportation Needs:   . Film/video editor (Medical):   Marland Kitchen Lack of Transportation (Non-Medical):   Physical Activity:   . Days of Exercise per Week:   . Minutes of Exercise per Session:   Stress:   . Feeling of Stress :   Social Connections:   . Frequency of Communication with Friends and Family:   . Frequency of Social Gatherings with Friends and Family:   . Attends Religious Services:   . Active Member of Clubs or Organizations:   . Attends Archivist Meetings:   Marland Kitchen Marital Status:    Past Surgical History:  Procedure Laterality Date  . CRANIOPLASTY N/A 06/10/2013   Procedure: CRANIOPLASTY;  Surgeon: Floyce Stakes, MD;  Location: [facility];  Service: Neurosurgery;  Laterality: N/A;  CRANIOPLASTY  . CRANIOTOMY Left 12/15/2012   Procedure: CRANIECTOMY HEMATOMA EVACUATION SUBDURAL WITH PLACEMENT OF BONE FLAP IN ABDOMINAL WALL;  Surgeon: Floyce Stakes, MD;  Location: [facility]  NEURO ORS;  Service: Neurosurgery;  Laterality: Left;  . CRANIOTOMY Left 12/28/2012   Procedure: CRANIOTOMY HEMATOMA EVACUATION SUBDURAL;  Surgeon: Erline Levine, MD;  Location: [place] NEURO ORS;  Service: Neurosurgery;  Laterality: Left;  . PEG PLACEMENT N/A 12/31/2012   Procedure: PERCUTANEOUS ENDOSCOPIC GASTROSTOMY (PEG) PLACEMENT;  Surgeon: Zenovia Jarred, MD;  Location: [place];  Service: General;  Laterality: N/A;  bedside  peg  . PERCUTANEOUS TRACHEOSTOMY N/A 12/31/2012   Procedure: PERCUTANEOUS TRACHEOSTOMY (BEDSIDE);  Surgeon: Zenovia Jarred, MD;  Location: [place];  Service: General;  Laterality: N/A;  . WRIST SURGERY Right 2008   Past Medical History:  Diagnosis Date  . Headache(784.0)   . Rotator cuff injury    right  . Seizures ([place])    12/28/2012  . Stroke [facility])     some aphasia , weakness in the R arm , Out pt. rehab currently   . Swallowing problem    still relearning controlled swallowing - rec'ing speech therapy in rehab  . Traumatic brain injury ([place])    BP 130/87   Pulse 65   Temp (!) 97.3 F (36.3 C)   Ht 6' (1.829 m)   Wt 217 lb 12.8 oz (98.8 kg)   SpO2 96%   BMI 29.54 kg/m   Opioid Risk Score:   Fall Risk Score:  `1  Depression screen PHQ 2/9  Depression screen [facility] 2/9 01/07/2020 03/11/2019 05/17/2016 04/30/2013  Decreased Interest 0 0 0 0  Down, Depressed, Hopeless 0 0 0 0  PHQ - 2 Score 0 0 0 0  Some recent data might be hidden    Review of Systems  Constitutional: Negative.   HENT: Negative.   Eyes: Negative.   Respiratory: Negative.   Cardiovascular: Negative.   Gastrointestinal: Negative.   Endocrine: Negative.   Genitourinary: Negative.   Musculoskeletal: Negative.   Skin: Negative.   Allergic/Immunologic: Negative.   Neurological: Positive for numbness.       Tingling  Hematological: Negative.   Psychiatric/Behavioral: Negative.   All other systems reviewed and are negative.      Objective:   Physical Exam  General: No acute distress HEENT: EOMI, oral membranes moist Cards: reg rate  Chest: normal effort Abdomen: Soft, NT, ND Skin: dry, intact Extremities: no edema Neuro:more focused. Improved attention.Right homonymous hemianopsia still present.  Musculoskeletal: functional rom. Psych: pleasant    Assessment:  1. 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 Status post craniotomy evacuation of hematoma.Persistent ongoing cognitive deficitsas well as right visual field loss. He remains non-vocational given his ongoing deficits and inability to drive. He has improved from a standpoint of using compensatory strategies however. 2. Old right shoulder injury, likely RTC tendonitis-generally resolved 3. Seizure post- traumatic  4. Scrotal swelling,  pain----resolved 5. Post-traumatic headaches---resolved     Plan:  1.Continue Low dose propranolol for headache and mood---this has been effective 2. Will continue with exelon at 6mg  as he feels it's helped with his memory.  3. Concerta for attention and memory. Second rx for next month. He has been compliant with the use of the Concerta. He may continue using this as long as he feels it's helping. We can also consider tapering or using a different med We will continue the controlled substance monitoring program, this consists of regular clinic visits, examinations, routine drug screening, pill counts as well as use of [place] Controlled Substance Reporting System. [place]CCS[place] was reviewed today.   -may call for RF in about  2 months 4. Continue keppra 500mg  bid 5. Encouraged community and vocational interactions as possible 6. Unable to return to driving to righthomonymous hemianopsia.   15 minutes of face to face patient care time were spent during this visit. All questions were encouraged and answered.  Follow up with me in 4 months .

## 2020-01-07 NOTE — Patient Instructions (Signed)
PLEASE FEEL FREE TO CALL OUR OFFICE WITH ANY PROBLEMS OR QUESTIONS ([phone])      

## 2020-01-12 ENCOUNTER — Encounter: Payer: BC Managed Care – PPO | Attending: Physical Medicine & Rehabilitation | Admitting: Psychology

## 2020-01-12 ENCOUNTER — Other Ambulatory Visit: Payer: Self-pay

## 2020-01-12 DIAGNOSIS — Z5181 Encounter for therapeutic drug level monitoring: Secondary | ICD-10-CM | POA: Insufficient documentation

## 2020-01-12 DIAGNOSIS — S069X9S Unspecified intracranial injury with loss of consciousness of unspecified duration, sequela: Secondary | ICD-10-CM | POA: Diagnosis not present

## 2020-01-12 DIAGNOSIS — Z79891 Long term (current) use of opiate analgesic: Secondary | ICD-10-CM | POA: Insufficient documentation

## 2020-01-12 DIAGNOSIS — S066X1S Traumatic subarachnoid hemorrhage with loss of consciousness of 30 minutes or less, sequela: Secondary | ICD-10-CM | POA: Diagnosis not present

## 2020-01-12 DIAGNOSIS — G894 Chronic pain syndrome: Secondary | ICD-10-CM | POA: Diagnosis not present

## 2020-01-12 DIAGNOSIS — S069X0S Unspecified intracranial injury without loss of consciousness, sequela: Secondary | ICD-10-CM | POA: Insufficient documentation

## 2020-01-12 DIAGNOSIS — F068 Other specified mental disorders due to known physiological condition: Secondary | ICD-10-CM | POA: Diagnosis not present

## 2020-01-12 DIAGNOSIS — F0630 Mood disorder due to known physiological condition, unspecified: Secondary | ICD-10-CM | POA: Diagnosis not present

## 2020-01-12 DIAGNOSIS — H53461 Homonymous bilateral field defects, right side: Secondary | ICD-10-CM | POA: Insufficient documentation

## 2020-01-13 ENCOUNTER — Encounter: Payer: Self-pay | Admitting: Psychology

## 2020-01-13 NOTE — Progress Notes (Signed)
01/12/2020  Today's visit was a 1 hour in person visit that was conducted in my outpatient clinic office.  The patient and myself were present for this visit.  We have continue to work on adjustment coping strategies around residual effects of his CVA and resulting changes in cognitive functioning.  While the patient has made significant improvements he has not been able to return back to his previous job.  The patient has been working at [employer] and they have "promoted" him and he is now waiting tables at [employer] he has been working out.  However, he does not take food orders but does take the food out to people's tables and maintain their drinks and other waiting responsibilities.  The patient reports that he does enjoy being around people and communicating with them but is aware of some of his limitations.  The patient has been doing much better as far as home and has been working on the interactions between he and his 2 children as well as he and his wife.  There have been some improvements in family dynamics but there continues to be some challenges with his kids.

## 2020-01-16 DIAGNOSIS — R110 Nausea: Secondary | ICD-10-CM | POA: Diagnosis not present

## 2020-01-16 DIAGNOSIS — S069X0S Unspecified intracranial injury without loss of consciousness, sequela: Secondary | ICD-10-CM | POA: Diagnosis not present

## 2020-01-16 DIAGNOSIS — R413 Other amnesia: Secondary | ICD-10-CM | POA: Diagnosis not present

## 2020-02-19 ENCOUNTER — Other Ambulatory Visit: Payer: Self-pay

## 2020-02-19 ENCOUNTER — Encounter: Payer: BC Managed Care – PPO | Attending: Physical Medicine & Rehabilitation | Admitting: Psychology

## 2020-02-19 ENCOUNTER — Encounter: Payer: Self-pay | Admitting: Psychology

## 2020-02-19 DIAGNOSIS — Z79891 Long term (current) use of opiate analgesic: Secondary | ICD-10-CM | POA: Diagnosis not present

## 2020-02-19 DIAGNOSIS — F0630 Mood disorder due to known physiological condition, unspecified: Secondary | ICD-10-CM | POA: Diagnosis not present

## 2020-02-19 DIAGNOSIS — F068 Other specified mental disorders due to known physiological condition: Secondary | ICD-10-CM | POA: Insufficient documentation

## 2020-02-19 DIAGNOSIS — S069X9S Unspecified intracranial injury with loss of consciousness of unspecified duration, sequela: Secondary | ICD-10-CM | POA: Diagnosis not present

## 2020-02-19 DIAGNOSIS — Z5181 Encounter for therapeutic drug level monitoring: Secondary | ICD-10-CM | POA: Insufficient documentation

## 2020-02-19 DIAGNOSIS — S069XAS Unspecified intracranial injury with loss of consciousness status unknown, sequela: Secondary | ICD-10-CM

## 2020-02-19 DIAGNOSIS — G894 Chronic pain syndrome: Secondary | ICD-10-CM | POA: Diagnosis not present

## 2020-02-19 DIAGNOSIS — S066X1S Traumatic subarachnoid hemorrhage with loss of consciousness of 30 minutes or less, sequela: Secondary | ICD-10-CM

## 2020-02-19 DIAGNOSIS — R4189 Other symptoms and signs involving cognitive functions and awareness: Secondary | ICD-10-CM

## 2020-02-19 DIAGNOSIS — S069X0S Unspecified intracranial injury without loss of consciousness, sequela: Secondary | ICD-10-CM | POA: Diagnosis not present

## 2020-02-19 DIAGNOSIS — H53461 Homonymous bilateral field defects, right side: Secondary | ICD-10-CM | POA: Insufficient documentation

## 2020-02-19 NOTE — Progress Notes (Signed)
02/19/2020  Today's visit was a 1 hour in person visit that was conducted in my outpatient clinic office.  The patient myself were present for this visit.  We continue to work on adjustment issues and residual effects of his CVA and resulting changes in cognitive functioning.  The patient has been progressively increasing the amount of work type activities that he has been doing.  The patient has been doing part-time work for [employer] and is gone from doing primarily [employer] work to now doing more waiting types of activities within [employer].  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  The patient reports that he continues to improve some of the behavioral interactions between he and his daughter but there are some stressors between he and his wife but those have been fairly stable and not extremely problematic but just continuing.  The patient reports that his young son still has difficulties with impulse control and various manipulative behaviors but the patient is working on parenting strategies and parenting goals.

## 2020-02-23 DIAGNOSIS — E782 Mixed hyperlipidemia: Secondary | ICD-10-CM | POA: Diagnosis not present

## 2020-02-23 DIAGNOSIS — I10 Essential (primary) hypertension: Secondary | ICD-10-CM | POA: Diagnosis not present

## 2020-02-25 DIAGNOSIS — E782 Mixed hyperlipidemia: Secondary | ICD-10-CM | POA: Diagnosis not present

## 2020-02-25 DIAGNOSIS — S069X9A Unspecified intracranial injury with loss of consciousness of unspecified duration, initial encounter: Secondary | ICD-10-CM | POA: Diagnosis not present

## 2020-02-25 DIAGNOSIS — J309 Allergic rhinitis, unspecified: Secondary | ICD-10-CM | POA: Diagnosis not present

## 2020-02-25 DIAGNOSIS — I10 Essential (primary) hypertension: Secondary | ICD-10-CM | POA: Diagnosis not present

## 2020-02-26 ENCOUNTER — Telehealth: Payer: Self-pay

## 2020-02-26 ENCOUNTER — Other Ambulatory Visit: Payer: Self-pay

## 2020-02-26 DIAGNOSIS — S066X1S Traumatic subarachnoid hemorrhage with loss of consciousness of 30 minutes or less, sequela: Secondary | ICD-10-CM

## 2020-02-26 MED ORDER — RIVASTIGMINE TARTRATE 4.5 MG PO CAPS: 4.5000 mg | ORAL_CAPSULE | Freq: Two times a day (BID) | ORAL | 5 refills | Status: DC

## 2020-02-26 NOTE — Telephone Encounter (Signed)
Methylphenidate refill request.

## 2020-02-26 NOTE — Telephone Encounter (Signed)
Albert [name] c/o nausea when taking Rivastigmine 6 MG. No problem with Rivastigmine  4.5 MG. He always takes it with food.   Please advise

## 2020-02-26 NOTE — Addendum Note (Signed)
Addended by: Alger Simons T on: 02/26/2020 04:08 PM   Modules accepted: Orders

## 2020-02-26 NOTE — Telephone Encounter (Signed)
I sent an rx in for the 4.5mg  dose

## 2020-02-27 MED ORDER — METHYLPHENIDATE HCL ER (OSM) 36 MG PO TBCR: 36.0000 mg | EXTENDED_RELEASE_TABLET | Freq: Every day | ORAL | 0 refills | Status: DC

## 2020-02-27 NOTE — Telephone Encounter (Signed)
Please advise Dr. Naaman Plummer is off today. Thank you

## 2020-02-27 NOTE — Addendum Note (Signed)
Addended by: Bayard Hugger on: 02/27/2020 02:25 PM   Modules accepted: Orders

## 2020-02-27 NOTE — Telephone Encounter (Signed)
PMP was Reviewed: Dr Naaman Plummer Note was Reviewed. Concerta e-scribed today. Placed a call to Mr. Kagel, he verbalizes understanding.

## 2020-03-17 ENCOUNTER — Other Ambulatory Visit: Payer: Self-pay

## 2020-03-17 ENCOUNTER — Encounter: Payer: BC Managed Care – PPO | Attending: Physical Medicine & Rehabilitation | Admitting: Psychology

## 2020-03-17 DIAGNOSIS — Z79891 Long term (current) use of opiate analgesic: Secondary | ICD-10-CM | POA: Diagnosis not present

## 2020-03-17 DIAGNOSIS — S069X0S Unspecified intracranial injury without loss of consciousness, sequela: Secondary | ICD-10-CM | POA: Insufficient documentation

## 2020-03-17 DIAGNOSIS — G894 Chronic pain syndrome: Secondary | ICD-10-CM | POA: Insufficient documentation

## 2020-03-17 DIAGNOSIS — F0630 Mood disorder due to known physiological condition, unspecified: Secondary | ICD-10-CM

## 2020-03-17 DIAGNOSIS — S069X9S Unspecified intracranial injury with loss of consciousness of unspecified duration, sequela: Secondary | ICD-10-CM

## 2020-03-17 DIAGNOSIS — F068 Other specified mental disorders due to known physiological condition: Secondary | ICD-10-CM | POA: Insufficient documentation

## 2020-03-17 DIAGNOSIS — S066X1S Traumatic subarachnoid hemorrhage with loss of consciousness of 30 minutes or less, sequela: Secondary | ICD-10-CM | POA: Insufficient documentation

## 2020-03-17 DIAGNOSIS — H53461 Homonymous bilateral field defects, right side: Secondary | ICD-10-CM | POA: Insufficient documentation

## 2020-03-17 DIAGNOSIS — S069XAS Unspecified intracranial injury with loss of consciousness status unknown, sequela: Secondary | ICD-10-CM

## 2020-03-17 DIAGNOSIS — Z5181 Encounter for therapeutic drug level monitoring: Secondary | ICD-10-CM | POA: Insufficient documentation

## 2020-03-18 ENCOUNTER — Encounter: Payer: Self-pay | Admitting: Psychology

## 2020-03-18 NOTE — Progress Notes (Signed)
03/16/2020:  Today's visit was a 1 hour in person visit that was conducted in my outpatient clinic office.  The patient myself were present for this visit.  We have continue to work on adjustment issues around his residual effects of his CVA following blunt trauma.  The patient has continued to work at [employer] and has been managing more responsible activities but continues to struggle with memory and residual effects.  He has been working on consistency at home with his interactions with his wife and his kids and has remained highly motivated and active in the therapeutic efforts as well as working on issues between visits.  The patient continues with residual cognitive deficits but has made great improvements in his overall life functioning following his traumatic brain injury.

## 2020-04-05 DIAGNOSIS — S069X9A Unspecified intracranial injury with loss of consciousness of unspecified duration, initial encounter: Secondary | ICD-10-CM | POA: Diagnosis not present

## 2020-04-05 DIAGNOSIS — I10 Essential (primary) hypertension: Secondary | ICD-10-CM | POA: Diagnosis not present

## 2020-04-05 DIAGNOSIS — E782 Mixed hyperlipidemia: Secondary | ICD-10-CM | POA: Diagnosis not present

## 2020-04-09 DIAGNOSIS — E782 Mixed hyperlipidemia: Secondary | ICD-10-CM | POA: Diagnosis not present

## 2020-04-09 DIAGNOSIS — G4700 Insomnia, unspecified: Secondary | ICD-10-CM | POA: Diagnosis not present

## 2020-04-09 DIAGNOSIS — I10 Essential (primary) hypertension: Secondary | ICD-10-CM | POA: Diagnosis not present

## 2020-04-09 DIAGNOSIS — E1169 Type 2 diabetes mellitus with other specified complication: Secondary | ICD-10-CM | POA: Diagnosis not present

## 2020-05-05 ENCOUNTER — Encounter
Payer: BC Managed Care – PPO | Attending: Physical Medicine & Rehabilitation | Admitting: Physical Medicine & Rehabilitation

## 2020-05-05 ENCOUNTER — Encounter: Payer: Self-pay | Admitting: Physical Medicine & Rehabilitation

## 2020-05-05 ENCOUNTER — Other Ambulatory Visit: Payer: Self-pay

## 2020-05-05 VITALS — BP 135/68 | HR 55 | Temp 98.6°F | Ht 72.0 in | Wt 223.0 lb

## 2020-05-05 DIAGNOSIS — H53461 Homonymous bilateral field defects, right side: Secondary | ICD-10-CM | POA: Insufficient documentation

## 2020-05-05 DIAGNOSIS — F0630 Mood disorder due to known physiological condition, unspecified: Secondary | ICD-10-CM | POA: Diagnosis not present

## 2020-05-05 DIAGNOSIS — S069X9S Unspecified intracranial injury with loss of consciousness of unspecified duration, sequela: Secondary | ICD-10-CM

## 2020-05-05 DIAGNOSIS — S069X0S Unspecified intracranial injury without loss of consciousness, sequela: Secondary | ICD-10-CM

## 2020-05-05 DIAGNOSIS — Z79891 Long term (current) use of opiate analgesic: Secondary | ICD-10-CM

## 2020-05-05 DIAGNOSIS — F068 Other specified mental disorders due to known physiological condition: Secondary | ICD-10-CM | POA: Diagnosis not present

## 2020-05-05 DIAGNOSIS — S066X1S Traumatic subarachnoid hemorrhage with loss of consciousness of 30 minutes or less, sequela: Secondary | ICD-10-CM | POA: Diagnosis not present

## 2020-05-05 DIAGNOSIS — G894 Chronic pain syndrome: Secondary | ICD-10-CM | POA: Insufficient documentation

## 2020-05-05 DIAGNOSIS — Z5181 Encounter for therapeutic drug level monitoring: Secondary | ICD-10-CM | POA: Diagnosis not present

## 2020-05-05 MED ORDER — METHYLPHENIDATE HCL ER (OSM) 36 MG PO TBCR: 36.0000 mg | EXTENDED_RELEASE_TABLET | Freq: Every day | ORAL | 0 refills | Status: DC

## 2020-05-05 NOTE — Patient Instructions (Signed)
PLEASE FEEL FREE TO CALL OUR OFFICE WITH ANY PROBLEMS OR QUESTIONS ([phone])      

## 2020-05-05 NOTE — Progress Notes (Signed)
Subjective:    Patient ID: Albert [name], male    DOB: 11-13-1975, 44 y.o.   MRN: [id number]  HPI   Albert [name] is here in follow up of his TBI and associated cognitive deficits. Things have been going well. 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 He likes it because he is able to meet, talk with people. He does whatever's needed some days.   He is doing better with the 4.5mg  dose of exelon as the 6mg  dose made him nauseas .      Pain Inventory Average Pain 0 Pain Right Now 0 My pain is sharp  In the last 24 hours, has pain interfered with the following? General activity 0 Relation with others 0 Enjoyment of life 0 What TIME of day is your pain at its worst? evening Sleep (in general) Fair  Pain is worse with: walking and inactivity Pain improves with: rest, therapy/exercise and medication Relief from Meds: 3  Family History  Problem Relation Age of Onset  . Heart disease Father    Social History   Socioeconomic History  . Marital status: Married    Spouse name: Not on file  . Number of children: Not on file  . Years of education: Not on file  . Highest education level: Not on file  Occupational History  . Not on file  Tobacco Use  . Smoking status: Former Smoker    Types: Cigarettes    Quit date: 12/17/1999    Years since quitting: 20.3  . Smokeless tobacco: Never Used  Substance and Sexual Activity  . Alcohol use: Yes  . Drug use: No  . Sexual activity: Not on file  Other Topics Concern  . Not on file  Social History Narrative  . Not on file   Social Determinants of Health   Financial Resource Strain:   . Difficulty of Paying Living Expenses: Not on file  Food Insecurity:   . Worried About Charity fundraiser in the Last Year: Not on file  . Ran Out of Food in the Last Year: Not on file  Transportation Needs:   . Lack of Transportation (Medical): Not on file  . Lack of Transportation  (Non-Medical): Not on file  Physical Activity:   . Days of Exercise per Week: Not on file  . Minutes of Exercise per Session: Not on file  Stress:   . Feeling of Stress : Not on file  Social Connections:   . Frequency of Communication with Friends and Family: Not on file  . Frequency of Social Gatherings with Friends and Family: Not on file  . Attends Religious Services: Not on file  . Active Member of Clubs or Organizations: Not on file  . Attends Archivist Meetings: Not on file  . Marital Status: Not on file   Past Surgical History:  Procedure Laterality Date  . CRANIOPLASTY N/A 06/10/2013   Procedure: CRANIOPLASTY;  Surgeon: Floyce Stakes, MD;  Location: [facility];  Service: Neurosurgery;  Laterality: N/A;  CRANIOPLASTY  . CRANIOTOMY Left 12/15/2012   Procedure: CRANIECTOMY HEMATOMA EVACUATION SUBDURAL WITH PLACEMENT OF BONE FLAP IN ABDOMINAL WALL;  Surgeon: Floyce Stakes, MD;  Location: [place] [facility];  Service: Neurosurgery;  Laterality: Left;  . CRANIOTOMY Left 12/28/2012   Procedure: CRANIOTOMY HEMATOMA EVACUATION SUBDURAL;  Surgeon: Erline Levine, MD;  Location: [place] [facility];  Service: Neurosurgery;  Laterality: Left;  . PEG PLACEMENT  N/A 12/31/2012   Procedure: PERCUTANEOUS ENDOSCOPIC GASTROSTOMY (PEG) PLACEMENT;  Surgeon: Zenovia Jarred, MD;  Location: [place];  Service: General;  Laterality: N/A;  bedside  peg  . PERCUTANEOUS TRACHEOSTOMY N/A 12/31/2012   Procedure: PERCUTANEOUS TRACHEOSTOMY (BEDSIDE);  Surgeon: Zenovia Jarred, MD;  Location: [place];  Service: General;  Laterality: N/A;  . WRIST SURGERY Right 2008   Past Surgical History:  Procedure Laterality Date  . CRANIOPLASTY N/A 06/10/2013   Procedure: CRANIOPLASTY;  Surgeon: Floyce Stakes, MD;  Location: [facility];  Service: Neurosurgery;  Laterality: N/A;  CRANIOPLASTY  . CRANIOTOMY Left 12/15/2012   Procedure: CRANIECTOMY HEMATOMA EVACUATION SUBDURAL WITH PLACEMENT OF BONE FLAP IN ABDOMINAL  WALL;  Surgeon: Floyce Stakes, MD;  Location: [place] [facility];  Service: Neurosurgery;  Laterality: Left;  . CRANIOTOMY Left 12/28/2012   Procedure: CRANIOTOMY HEMATOMA EVACUATION SUBDURAL;  Surgeon: Erline Levine, MD;  Location: [place] [facility];  Service: Neurosurgery;  Laterality: Left;  . PEG PLACEMENT N/A 12/31/2012   Procedure: PERCUTANEOUS ENDOSCOPIC GASTROSTOMY (PEG) PLACEMENT;  Surgeon: Zenovia Jarred, MD;  Location: [place];  Service: General;  Laterality: N/A;  bedside  peg  . PERCUTANEOUS TRACHEOSTOMY N/A 12/31/2012   Procedure: PERCUTANEOUS TRACHEOSTOMY (BEDSIDE);  Surgeon: Zenovia Jarred, MD;  Location: [place];  Service: General;  Laterality: N/A;  . WRIST SURGERY Right 2008   Past Medical History:  Diagnosis Date  . Headache(784.0)   . Rotator cuff injury    right  . Seizures ([place])    12/28/2012  . Stroke [facility])    some aphasia , weakness in the R arm , Out pt. rehab currently   . Swallowing problem    still relearning controlled swallowing - rec'ing speech therapy in rehab  . Traumatic brain injury ([place])    BP 135/68   Pulse (!) 55   Temp 98.6 F (37 C)   Ht 6' (1.829 m)   Wt 223 lb (101.2 kg)   SpO2 96%   BMI 30.24 kg/m   Opioid Risk Score:   Fall Risk Score:  `1  Depression screen PHQ 2/9  Depression screen [facility] 2/9 01/07/2020 03/11/2019 05/17/2016 04/30/2013  Decreased Interest 0 0 0 0  Down, Depressed, Hopeless 0 0 0 0  PHQ - 2 Score 0 0 0 0  Some recent data might be hidden    Review of Systems  Constitutional: Negative.   HENT: Negative.   Eyes: Negative.   Respiratory: Negative.   Cardiovascular: Negative.   Gastrointestinal: Negative.   Endocrine: Negative.   Genitourinary: Negative.   Musculoskeletal: Negative.   Skin: Negative.   Allergic/Immunologic: Negative.   Neurological: Negative.   Hematological: Negative.   Psychiatric/Behavioral: Positive for decreased concentration.  All other systems reviewed and are negative.      Objective:    Physical Exam  General: No acute distress HEENT: EOMI, oral membranes moist Cards: reg rate  Chest: normal effort Abdomen: Soft, NT, ND Skin: dry, intact Extremities: no edema Neuro: more focused. functional attention.  Right HH remains. compensates for it better.  Musculoskeletal:  functional rom.  Psych: pleasant and appropriate       Assessment:  1. 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 Status post craniotomy evacuation of hematoma. Persistent ongoing cognitive deficits as well as right visual field loss.  he is non-vocational 2. Old right shoulder injury, likely RTC tendonitis- generally resolved  3. Seizure post- traumatic  4. Scrotal swelling, pain----resolved 5. Post-traumatic  headaches---resolved       Plan:  1. Maintain dose propranolol for headache and mood---this has been effective 2. Will continue with exelon at 4.5mg  BID as he feels it's helped with his memory.  3. Concerta for attention and memory.  Second rx for next month.  He has been compliant with the use of the Concerta. He may continue using this as long as he feels it's helping. We can also consider tapering or using a different med           We will continue the controlled substance monitoring program, this consists of regular clinic visits, examinations, routine drug screening, pill counts as well as use of [place] Controlled Substance Reporting System. [place]CCS[place] was reviewed today.    -UDS today             -may call for RF in about 2 months 4. Continue keppra 500mg  bid 5. continue vocational issues as he's doing 6. Unable to return to driving to right homonymous hemianopsia.    15 minutes of face to face patient care time were spent during this visit. All questions were encouraged and answered.  Follow up with me in 4 months .

## 2020-05-06 ENCOUNTER — Encounter: Payer: Self-pay | Admitting: Psychology

## 2020-05-06 ENCOUNTER — Encounter (HOSPITAL_BASED_OUTPATIENT_CLINIC_OR_DEPARTMENT_OTHER): Payer: BC Managed Care – PPO | Admitting: Psychology

## 2020-05-06 DIAGNOSIS — S066X1S Traumatic subarachnoid hemorrhage with loss of consciousness of 30 minutes or less, sequela: Secondary | ICD-10-CM | POA: Diagnosis not present

## 2020-05-06 DIAGNOSIS — F068 Other specified mental disorders due to known physiological condition: Secondary | ICD-10-CM

## 2020-05-06 DIAGNOSIS — S069X0S Unspecified intracranial injury without loss of consciousness, sequela: Secondary | ICD-10-CM

## 2020-05-06 DIAGNOSIS — S069X9S Unspecified intracranial injury with loss of consciousness of unspecified duration, sequela: Secondary | ICD-10-CM | POA: Diagnosis not present

## 2020-05-06 DIAGNOSIS — H53461 Homonymous bilateral field defects, right side: Secondary | ICD-10-CM | POA: Diagnosis not present

## 2020-05-06 DIAGNOSIS — Z5181 Encounter for therapeutic drug level monitoring: Secondary | ICD-10-CM | POA: Diagnosis not present

## 2020-05-06 DIAGNOSIS — Z79891 Long term (current) use of opiate analgesic: Secondary | ICD-10-CM | POA: Diagnosis not present

## 2020-05-06 DIAGNOSIS — G894 Chronic pain syndrome: Secondary | ICD-10-CM | POA: Diagnosis not present

## 2020-05-06 DIAGNOSIS — F0630 Mood disorder due to known physiological condition, unspecified: Secondary | ICD-10-CM

## 2020-05-06 NOTE — Progress Notes (Signed)
05/06/2020:  Today's visit was a 1 hour in person visit that was conducted in my outpatient clinic office.  The patient myself were present for this visit.  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  The patient reports that doing the waiting portion is challenging and does put stress on his reduced memory functions.  The patient reports that he is doing better as far as managing home life situations and helping out with the children and dealing with psychosocial stressors there.  The patient reports that he continues to have ongoing cognitive difficulties that are direct result of his cerebrovascular accident.  We have continue to work on coping skills and strategies around these issues.

## 2020-05-08 LAB — TOXASSURE SELECT,+ANTIDEPR,UR

## 2020-05-13 ENCOUNTER — Telehealth: Payer: Self-pay | Admitting: *Deleted

## 2020-05-13 NOTE — Telephone Encounter (Signed)
Urine drug screen for this encounter is consistent for prescribed medication Ritalin. There is alcohol present. This is first positive for ETOH.

## 2020-05-28 DIAGNOSIS — L309 Dermatitis, unspecified: Secondary | ICD-10-CM | POA: Diagnosis not present

## 2020-06-01 DIAGNOSIS — H5581 Saccadic eye movements: Secondary | ICD-10-CM | POA: Diagnosis not present

## 2020-06-01 DIAGNOSIS — I69398 Other sequelae of cerebral infarction: Secondary | ICD-10-CM | POA: Diagnosis not present

## 2020-06-01 DIAGNOSIS — H5347 Heteronymous bilateral field defects: Secondary | ICD-10-CM | POA: Diagnosis not present

## 2020-06-01 DIAGNOSIS — H524 Presbyopia: Secondary | ICD-10-CM | POA: Diagnosis not present

## 2020-06-02 ENCOUNTER — Encounter: Payer: BC Managed Care – PPO | Attending: Physical Medicine & Rehabilitation | Admitting: Psychology

## 2020-06-02 ENCOUNTER — Other Ambulatory Visit: Payer: Self-pay

## 2020-06-02 DIAGNOSIS — Z79891 Long term (current) use of opiate analgesic: Secondary | ICD-10-CM | POA: Insufficient documentation

## 2020-06-02 DIAGNOSIS — Z5181 Encounter for therapeutic drug level monitoring: Secondary | ICD-10-CM | POA: Insufficient documentation

## 2020-06-02 DIAGNOSIS — G894 Chronic pain syndrome: Secondary | ICD-10-CM | POA: Diagnosis not present

## 2020-06-02 DIAGNOSIS — H53461 Homonymous bilateral field defects, right side: Secondary | ICD-10-CM | POA: Insufficient documentation

## 2020-06-02 DIAGNOSIS — F0630 Mood disorder due to known physiological condition, unspecified: Secondary | ICD-10-CM

## 2020-06-02 DIAGNOSIS — F068 Other specified mental disorders due to known physiological condition: Secondary | ICD-10-CM | POA: Insufficient documentation

## 2020-06-02 DIAGNOSIS — S069X0S Unspecified intracranial injury without loss of consciousness, sequela: Secondary | ICD-10-CM

## 2020-06-02 DIAGNOSIS — S069X9S Unspecified intracranial injury with loss of consciousness of unspecified duration, sequela: Secondary | ICD-10-CM | POA: Insufficient documentation

## 2020-06-02 DIAGNOSIS — S066X1S Traumatic subarachnoid hemorrhage with loss of consciousness of 30 minutes or less, sequela: Secondary | ICD-10-CM | POA: Diagnosis not present

## 2020-06-08 ENCOUNTER — Encounter: Payer: Self-pay | Admitting: Psychology

## 2020-06-08 NOTE — Progress Notes (Signed)
05/06/2020:  Today's visit was a 1 hour in person visit that was conducted in my outpatient clinic office.  The patient myself were present for this visit.  The patient has continued to work in [employer].  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  However, his memory deficits continue to be problematic and he is clearly not able to return to his previous job functions.  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  The patient reports that this was quite rewarding for him and he felt good about doing it.  There continue to be psychosocial stressors within the house that are exacerbated by his underlying cognitive changes from his traumatic brain injury.  However, the patient is doing much better managing these issues and the stress around them and become much more manageable.

## 2020-06-29 ENCOUNTER — Ambulatory Visit: Payer: BC Managed Care – PPO | Admitting: Psychology

## 2020-06-30 DIAGNOSIS — S066X1S Traumatic subarachnoid hemorrhage with loss of consciousness of 30 minutes or less, sequela: Secondary | ICD-10-CM

## 2020-06-30 MED ORDER — METHYLPHENIDATE HCL ER (OSM) 36 MG PO TBCR: 36.0000 mg | EXTENDED_RELEASE_TABLET | Freq: Every day | ORAL | 0 refills | Status: DC

## 2020-06-30 NOTE — Telephone Encounter (Signed)
concerta rf'd

## 2020-07-13 ENCOUNTER — Other Ambulatory Visit: Payer: Self-pay

## 2020-07-13 ENCOUNTER — Encounter: Payer: Self-pay | Admitting: Psychology

## 2020-07-13 ENCOUNTER — Encounter: Payer: BC Managed Care – PPO | Attending: Physical Medicine & Rehabilitation | Admitting: Psychology

## 2020-07-13 DIAGNOSIS — F068 Other specified mental disorders due to known physiological condition: Secondary | ICD-10-CM | POA: Insufficient documentation

## 2020-07-13 DIAGNOSIS — S069X0S Unspecified intracranial injury without loss of consciousness, sequela: Secondary | ICD-10-CM | POA: Insufficient documentation

## 2020-07-13 DIAGNOSIS — S066X1S Traumatic subarachnoid hemorrhage with loss of consciousness of 30 minutes or less, sequela: Secondary | ICD-10-CM | POA: Diagnosis not present

## 2020-07-13 DIAGNOSIS — S069X9S Unspecified intracranial injury with loss of consciousness of unspecified duration, sequela: Secondary | ICD-10-CM

## 2020-07-13 DIAGNOSIS — F0630 Mood disorder due to known physiological condition, unspecified: Secondary | ICD-10-CM | POA: Insufficient documentation

## 2020-07-13 NOTE — Progress Notes (Signed)
07/13/2020:  Today's visit was a 1 hour in person visit that was conducted in my outpatient clinic office.  The patient and myself were present for this visit.  The patient has continued to make progress in his coping with his residual effects of his traumatic brain injury.  Has not been able to return to prior work but has been working as a Education officer, museum and has been doing well with this job responsibility.  However his memory deficits continue to be problematic.  They are also significant psychosocial stressors going on but the patient has difficulty with flexibility and adjustment but some of the psychosocial stressors are not due to his difficulties but just overall situation.  The patient is working hard on developing and continuing to provide good coping responses.  We have continue to work on therapeutic interventions around coping and management with the frustrations and agitations that are magnified by his traumatic brain injury.

## 2020-08-12 ENCOUNTER — Other Ambulatory Visit: Payer: Self-pay

## 2020-08-12 ENCOUNTER — Encounter: Payer: BC Managed Care – PPO | Attending: Physical Medicine & Rehabilitation | Admitting: Psychology

## 2020-08-12 DIAGNOSIS — F0630 Mood disorder due to known physiological condition, unspecified: Secondary | ICD-10-CM | POA: Diagnosis not present

## 2020-08-12 DIAGNOSIS — S069X0S Unspecified intracranial injury without loss of consciousness, sequela: Secondary | ICD-10-CM | POA: Diagnosis not present

## 2020-08-12 DIAGNOSIS — S069X9S Unspecified intracranial injury with loss of consciousness of unspecified duration, sequela: Secondary | ICD-10-CM | POA: Diagnosis not present

## 2020-08-12 DIAGNOSIS — F068 Other specified mental disorders due to known physiological condition: Secondary | ICD-10-CM | POA: Diagnosis not present

## 2020-08-12 DIAGNOSIS — S066X1S Traumatic subarachnoid hemorrhage with loss of consciousness of 30 minutes or less, sequela: Secondary | ICD-10-CM | POA: Insufficient documentation

## 2020-08-13 NOTE — Progress Notes (Signed)
08/12/2020: 10 AM to 11 AM  Today's visit was an in person visit that was conducted in my outpatient clinic office.  The patient myself were present.  The patient has continued with progress regarding coping with his residual effects of his traumatic brain injury.  Memory and attention and information processing speed continue to be issues he is coping with.  The patient has had an increase in work in the job that he has been doing where he is now doing a Pharmacologist job.  He reports that it requires a lot of effort on his part but he is able to successfully do this work.  The patient reports that it is very important to him to stay as active as he can.  Psychosocial stressors have stabilized for the most part but there are some ongoing issues that crop up frustrations and agitations that he is having some deal with that are magnified by his traumatic brain injury.

## 2020-09-01 ENCOUNTER — Encounter (HOSPITAL_BASED_OUTPATIENT_CLINIC_OR_DEPARTMENT_OTHER): Payer: BC Managed Care – PPO | Admitting: Physical Medicine & Rehabilitation

## 2020-09-01 ENCOUNTER — Other Ambulatory Visit: Payer: Self-pay | Admitting: Physical Medicine & Rehabilitation

## 2020-09-01 ENCOUNTER — Encounter: Payer: Self-pay | Admitting: Physical Medicine & Rehabilitation

## 2020-09-01 ENCOUNTER — Other Ambulatory Visit: Payer: Self-pay

## 2020-09-01 VITALS — BP 133/80 | HR 66 | Temp 97.9°F | Ht 72.0 in | Wt 222.6 lb

## 2020-09-01 DIAGNOSIS — F068 Other specified mental disorders due to known physiological condition: Secondary | ICD-10-CM

## 2020-09-01 DIAGNOSIS — S066X1S Traumatic subarachnoid hemorrhage with loss of consciousness of 30 minutes or less, sequela: Secondary | ICD-10-CM

## 2020-09-01 DIAGNOSIS — S069X0S Unspecified intracranial injury without loss of consciousness, sequela: Secondary | ICD-10-CM | POA: Diagnosis not present

## 2020-09-01 DIAGNOSIS — S069X9S Unspecified intracranial injury with loss of consciousness of unspecified duration, sequela: Secondary | ICD-10-CM

## 2020-09-01 DIAGNOSIS — F0630 Mood disorder due to known physiological condition, unspecified: Secondary | ICD-10-CM

## 2020-09-01 DIAGNOSIS — Z20822 Contact with and (suspected) exposure to covid-19: Secondary | ICD-10-CM | POA: Diagnosis not present

## 2020-09-01 DIAGNOSIS — S069XAS Unspecified intracranial injury with loss of consciousness status unknown, sequela: Secondary | ICD-10-CM

## 2020-09-01 MED ORDER — RIVASTIGMINE TARTRATE 4.5 MG PO CAPS: 4.5000 mg | ORAL_CAPSULE | Freq: Two times a day (BID) | ORAL | 5 refills | Status: DC

## 2020-09-01 MED ORDER — METHYLPHENIDATE HCL ER (OSM) 36 MG PO TBCR: 36.0000 mg | EXTENDED_RELEASE_TABLET | Freq: Every day | ORAL | 0 refills | Status: DC

## 2020-09-01 MED ORDER — LEVETIRACETAM 500 MG PO TABS: ORAL_TABLET | ORAL | 2 refills | Status: DC

## 2020-09-01 NOTE — Addendum Note (Signed)
Addended by: Alger Simons T on: 09/01/2020 04:47 PM   Modules accepted: Orders

## 2020-09-01 NOTE — Progress Notes (Signed)
Subjective:    Patient ID: Albert [name], male    DOB: 1976/06/30, 44 y.o.   MRN: [id number]  HPI   [name] is here in follow up of his TBI.  He continues to do well at home. He still has memory and concentration deficits but compensates with a schedule/organizer. His concerta and exelon have also provided benefit. We tried to wean these but he felt a difference when he did. Mood wise he's been in a good place  He's flying to [place] on Christmas day with his family! He's a little anxious but looking forward to the trip.    Pain Inventory Average Pain 0 Pain Right Now 0 My pain is constant, sharp and when I do get a headache. But no pain now.  LOCATION OF PAIN  Head (Frontal)  BOWEL Number of stools per week: 7 Oral laxative use No  Type of laxative None Enema or suppository use No  History of colostomy No  Incontinent No   BLADDER Normal In and out cath, frequency  N/a Able to self cath N/A Bladder incontinence No  Frequent urination No  Leakage with coughing No  Difficulty starting stream No  Incomplete bladder emptying No    Mobility walk without assistance how many minutes can you walk? no problem walking. ability to climb steps?  yes do you drive?  no Do you have any goals in this area?  yes  Function employed # of hrs/week 15 hrs per week also disable in 2015 Do you have any goals in this area?  yes  Neuro/Psych numbness tingling loss of taste or smell  Prior Studies Any changes since last visit?  no  Physicians involved in your care Any changes since last visit?  no   Family History  Problem Relation Age of Onset  . Heart disease Father    Social History   Socioeconomic History  . Marital status: Married    Spouse name: Not on file  . Number of children: Not on file  . Years of education: Not on file  . Highest education level: Not on file  Occupational History  . Not on file  Tobacco Use  . Smoking status: Former Smoker    Types:  Cigarettes    Quit date: 12/17/1999    Years since quitting: 20.7  . Smokeless tobacco: Never Used  Vaping Use  . Vaping Use: Never used  Substance and Sexual Activity  . Alcohol use: Yes  . Drug use: No  . Sexual activity: Not on file  Other Topics Concern  . Not on file  Social History Narrative  . Not on file   Social Determinants of Health   Financial Resource Strain: Not on file  Food Insecurity: Not on file  Transportation Needs: Not on file  Physical Activity: Not on file  Stress: Not on file  Social Connections: Not on file   Past Surgical History:  Procedure Laterality Date  . CRANIOPLASTY N/A 06/10/2013   Procedure: CRANIOPLASTY;  Surgeon: Floyce Stakes, MD;  Location: [facility];  Service: Neurosurgery;  Laterality: N/A;  CRANIOPLASTY  . CRANIOTOMY Left 12/15/2012   Procedure: CRANIECTOMY HEMATOMA EVACUATION SUBDURAL WITH PLACEMENT OF BONE FLAP IN ABDOMINAL WALL;  Surgeon: Floyce Stakes, MD;  Location: [place] [facility];  Service: Neurosurgery;  Laterality: Left;  . CRANIOTOMY Left 12/28/2012   Procedure: CRANIOTOMY HEMATOMA EVACUATION SUBDURAL;  Surgeon: Erline Levine, MD;  Location: [place] [facility];  Service: Neurosurgery;  Laterality: Left;  .  PEG PLACEMENT N/A 12/31/2012   Procedure: PERCUTANEOUS ENDOSCOPIC GASTROSTOMY (PEG) PLACEMENT;  Surgeon: Zenovia Jarred, MD;  Location: [place];  Service: General;  Laterality: N/A;  bedside  peg  . PERCUTANEOUS TRACHEOSTOMY N/A 12/31/2012   Procedure: PERCUTANEOUS TRACHEOSTOMY (BEDSIDE);  Surgeon: Zenovia Jarred, MD;  Location: [place];  Service: General;  Laterality: N/A;  . WRIST SURGERY Right 2008   Past Medical History:  Diagnosis Date  . Headache(784.0)   . Rotator cuff injury    right  . Seizures ([place])    12/28/2012  . Stroke [facility])    some aphasia , weakness in the R arm , Out pt. rehab currently   . Swallowing problem    still relearning controlled swallowing - rec'ing speech therapy in rehab  . Traumatic  brain injury ([place])    BP 133/80   Pulse 66   Temp 97.9 F (36.6 C)   Ht 6' (1.829 m)   Wt 222 lb 9.6 oz (101 kg)   SpO2 96%   BMI 30.19 kg/m   Opioid Risk Score:   Fall Risk Score:  `1  Depression screen PHQ 2/9  Depression screen [facility] 2/9 01/07/2020 03/11/2019 05/17/2016 04/30/2013  Decreased Interest 0 0 0 0  Down, Depressed, Hopeless 0 0 0 0  PHQ - 2 Score 0 0 0 0  Some recent data might be hidden   Review of Systems  Neurological: Positive for numbness and headaches.       On and off headaches Right side of body numbness  All other systems reviewed and are negative.      Objective:   Physical Exam General: No acute distress HEENT: EOMI, oral membranes moist Cards: reg rate  Chest: normal effort Abdomen: Soft, NT, ND Skin: dry, intact Extremities: no edema Neuro: focused attention. Sometimes forgetful but able to reminds himself.  Right HH remains.    Musculoskeletal:  normal gait.  Psych: pleasant and appropriate       Assessment:  1. 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 Status post craniotomy evacuation of hematoma. Persistent ongoing cognitive deficits as well as right visual field loss---compensates fairly well.  2. Old right shoulder injury, likely RTC tendonitis- generally resolved  3. Seizure post- traumatic  4. Scrotal swelling, pain----resolved 5. Post-traumatic headaches---resolved       Plan:  1. Maintain dose propranolol for headache and mood---this has been effective 2. Will continue with exelon at 4.5mg  BID as he feels it's helped with his memory.  3. Concerta for attention and memory.   second rx for next mos  - He has been compliant with the use of the Concerta. He may continue using this as long as he feels it's helping.   -We will continue the controlled substance monitoring program, this consists of regular clinic visits, examinations, routine drug screening, pill counts as well as use of [place] Controlled Substance Reporting System. NCCSRS was reviewed today.    -Medication was refilled and a second prescription was sent to the patient's pharmacy for next month.     -may call for RF in about 2 months 4. Continue keppra 500mg  bid 5. continue vocational issues as he's doing 6. Unable to return to driving to right homonymous hemianopsia.    Fifteen minutes of face to face patient care time were spent during this visit. All questions were encouraged and answered.  Follow up with me in 4 mos .

## 2020-09-01 NOTE — Patient Instructions (Signed)
PLEASE FEEL FREE TO CALL OUR OFFICE WITH ANY PROBLEMS OR QUESTIONS ([phone])                                @                 @@               @@@                        @@@@                      @@@@@         @@@@@@                  @@@@@@@                @@@@@@@@              @@@@@@@@@             @@@@@@@@@@       IIII                  IIII                                                        HAPPY HOLIDAYS!!!!!  

## 2020-09-30 ENCOUNTER — Other Ambulatory Visit: Payer: Self-pay

## 2020-09-30 ENCOUNTER — Encounter: Payer: BC Managed Care – PPO | Attending: Physical Medicine & Rehabilitation | Admitting: Psychology

## 2020-09-30 DIAGNOSIS — F0630 Mood disorder due to known physiological condition, unspecified: Secondary | ICD-10-CM | POA: Diagnosis not present

## 2020-09-30 DIAGNOSIS — S069X9S Unspecified intracranial injury with loss of consciousness of unspecified duration, sequela: Secondary | ICD-10-CM | POA: Insufficient documentation

## 2020-09-30 DIAGNOSIS — S066X1S Traumatic subarachnoid hemorrhage with loss of consciousness of 30 minutes or less, sequela: Secondary | ICD-10-CM | POA: Insufficient documentation

## 2020-09-30 DIAGNOSIS — F068 Other specified mental disorders due to known physiological condition: Secondary | ICD-10-CM | POA: Diagnosis not present

## 2020-09-30 DIAGNOSIS — S069X0S Unspecified intracranial injury without loss of consciousness, sequela: Secondary | ICD-10-CM | POA: Insufficient documentation

## 2020-09-30 MED ORDER — METHYLPHENIDATE HCL ER (OSM) 36 MG PO TBCR: 36.0000 mg | EXTENDED_RELEASE_TABLET | Freq: Every day | ORAL | 0 refills | Status: DC

## 2020-09-30 NOTE — Telephone Encounter (Signed)
Changed fill date to 10/01/20

## 2020-10-01 ENCOUNTER — Encounter: Payer: Self-pay | Admitting: Psychology

## 2020-10-01 NOTE — Progress Notes (Signed)
09/30/2020: 8 AM to 9 AM  Today's visit was an in person visit that was conducted in my outpatient clinic office with the patient myself present.  The patient was able to successfully go to [place] with his family to visit his wife's family there.  This allowed for his children to spend time with her grandparents and cousins.  However, this was very stressful for him with travel and accommodation challenges during their trip with particular difficulties returning to the [place] due to complications with COVID testing entering the country and complications with weather patterns in other parts of the country.  The patient reports that he was able to manage the situation relatively well and utilized multitude of coping mechanisms to complete the process.  The patient reports that it continues to be a challenge for him with regard to memory deficits with his job working as a Doctor, general practice and that he spends significant time trying to remember and relearn the menu items with his job.  However, his employer is quite accommodating and understanding of his deficits and continues to work with him in a very positive manner.  The patient reports that some of the psychosocial stressors at home have been improving with time and he is getting more support with his wife regarding parenting differences.  We have continue to work on coping skills and strategies around residual/late effects of his traumatic brain injury due to subarachnoid hemorrhage.

## 2020-10-11 DIAGNOSIS — G4700 Insomnia, unspecified: Secondary | ICD-10-CM | POA: Diagnosis not present

## 2020-10-11 DIAGNOSIS — J309 Allergic rhinitis, unspecified: Secondary | ICD-10-CM | POA: Diagnosis not present

## 2020-10-11 DIAGNOSIS — I10 Essential (primary) hypertension: Secondary | ICD-10-CM | POA: Diagnosis not present

## 2020-10-28 ENCOUNTER — Other Ambulatory Visit: Payer: Self-pay | Admitting: Physical Medicine & Rehabilitation

## 2020-10-28 DIAGNOSIS — S069X9S Unspecified intracranial injury with loss of consciousness of unspecified duration, sequela: Secondary | ICD-10-CM

## 2020-10-28 DIAGNOSIS — F0630 Mood disorder due to known physiological condition, unspecified: Secondary | ICD-10-CM

## 2020-10-28 DIAGNOSIS — F068 Other specified mental disorders due to known physiological condition: Secondary | ICD-10-CM

## 2020-10-28 DIAGNOSIS — S066X1S Traumatic subarachnoid hemorrhage with loss of consciousness of 30 minutes or less, sequela: Secondary | ICD-10-CM

## 2020-11-04 ENCOUNTER — Other Ambulatory Visit: Payer: Self-pay

## 2020-11-04 ENCOUNTER — Encounter: Payer: Self-pay | Admitting: Psychology

## 2020-11-04 ENCOUNTER — Encounter: Payer: BC Managed Care – PPO | Attending: Physical Medicine & Rehabilitation | Admitting: Psychology

## 2020-11-04 DIAGNOSIS — F068 Other specified mental disorders due to known physiological condition: Secondary | ICD-10-CM | POA: Insufficient documentation

## 2020-11-04 DIAGNOSIS — S066X1S Traumatic subarachnoid hemorrhage with loss of consciousness of 30 minutes or less, sequela: Secondary | ICD-10-CM | POA: Insufficient documentation

## 2020-11-04 DIAGNOSIS — F0630 Mood disorder due to known physiological condition, unspecified: Secondary | ICD-10-CM | POA: Insufficient documentation

## 2020-11-04 DIAGNOSIS — S069X0S Unspecified intracranial injury without loss of consciousness, sequela: Secondary | ICD-10-CM | POA: Insufficient documentation

## 2020-11-04 DIAGNOSIS — S069X9S Unspecified intracranial injury with loss of consciousness of unspecified duration, sequela: Secondary | ICD-10-CM

## 2020-11-04 NOTE — Progress Notes (Signed)
11/04/2020: 9 AM-10 AM  Today's visit was an in person visit that was conducted in my outpatient clinic office with the patient myself present.  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  The patient reports that the owner is aware of his cognitive difficulties and allows for successful adjustment in compensation to his memory difficulties.  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  The patient reports that he has been looking at other possible things to engage in including a desire to return to volunteering at the hospital but because of Covid restrictions has not been able to do so.  The patient reports that his mood is been quite good and there have been significant improvements in household behaviors of his son and that relationship with his wife has improved somewhat although his wife continues to spend in a great deal of time on her phone and avoiding a lot of direct interactions.  We have continue to work on therapeutic interventions around coping and adjustment issues due to residual cognitive deficits and mood changes after a significant cerebrovascular event.

## 2020-11-09 DIAGNOSIS — I10 Essential (primary) hypertension: Secondary | ICD-10-CM | POA: Diagnosis not present

## 2020-11-09 DIAGNOSIS — G4700 Insomnia, unspecified: Secondary | ICD-10-CM | POA: Diagnosis not present

## 2020-11-09 DIAGNOSIS — F988 Other specified behavioral and emotional disorders with onset usually occurring in childhood and adolescence: Secondary | ICD-10-CM | POA: Diagnosis not present

## 2020-11-09 DIAGNOSIS — E663 Overweight: Secondary | ICD-10-CM | POA: Diagnosis not present

## 2020-11-15 DIAGNOSIS — Z125 Encounter for screening for malignant neoplasm of prostate: Secondary | ICD-10-CM | POA: Diagnosis not present

## 2020-11-15 DIAGNOSIS — Z0000 Encounter for general adult medical examination without abnormal findings: Secondary | ICD-10-CM | POA: Diagnosis not present

## 2020-11-18 DIAGNOSIS — Z0000 Encounter for general adult medical examination without abnormal findings: Secondary | ICD-10-CM | POA: Diagnosis not present

## 2020-11-18 DIAGNOSIS — H6122 Impacted cerumen, left ear: Secondary | ICD-10-CM | POA: Diagnosis not present

## 2020-11-18 DIAGNOSIS — H6123 Impacted cerumen, bilateral: Secondary | ICD-10-CM | POA: Diagnosis not present

## 2020-11-18 DIAGNOSIS — H6121 Impacted cerumen, right ear: Secondary | ICD-10-CM | POA: Diagnosis not present

## 2020-11-29 ENCOUNTER — Other Ambulatory Visit: Payer: Self-pay

## 2020-11-29 DIAGNOSIS — S066X1S Traumatic subarachnoid hemorrhage with loss of consciousness of 30 minutes or less, sequela: Secondary | ICD-10-CM

## 2020-11-29 NOTE — Telephone Encounter (Signed)
Albert [name] has requested his Methylphenidate 36 MG refill, he  has only 3 pills left. Can it be filled today? Per patient his wife will be leaving town today. She is the household driver.   Call back ph [phone]. Thank you.

## 2020-11-30 MED ORDER — METHYLPHENIDATE HCL ER (OSM) 36 MG PO TBCR: 36.0000 mg | EXTENDED_RELEASE_TABLET | Freq: Every day | ORAL | 0 refills | Status: DC

## 2020-11-30 NOTE — Telephone Encounter (Signed)
Refill sent to pharmacy.   

## 2020-12-01 ENCOUNTER — Other Ambulatory Visit: Payer: Self-pay

## 2020-12-01 ENCOUNTER — Encounter: Payer: BC Managed Care – PPO | Attending: Physical Medicine & Rehabilitation | Admitting: Psychology

## 2020-12-01 DIAGNOSIS — S069X0S Unspecified intracranial injury without loss of consciousness, sequela: Secondary | ICD-10-CM | POA: Insufficient documentation

## 2020-12-01 DIAGNOSIS — F0630 Mood disorder due to known physiological condition, unspecified: Secondary | ICD-10-CM | POA: Insufficient documentation

## 2020-12-01 DIAGNOSIS — F068 Other specified mental disorders due to known physiological condition: Secondary | ICD-10-CM | POA: Insufficient documentation

## 2020-12-01 DIAGNOSIS — S069X9S Unspecified intracranial injury with loss of consciousness of unspecified duration, sequela: Secondary | ICD-10-CM | POA: Insufficient documentation

## 2020-12-01 DIAGNOSIS — S066X1S Traumatic subarachnoid hemorrhage with loss of consciousness of 30 minutes or less, sequela: Secondary | ICD-10-CM | POA: Diagnosis not present

## 2020-12-01 DIAGNOSIS — S069XAS Unspecified intracranial injury with loss of consciousness status unknown, sequela: Secondary | ICD-10-CM

## 2020-12-02 ENCOUNTER — Encounter: Payer: Self-pay | Admitting: Psychology

## 2020-12-02 NOTE — Progress Notes (Signed)
12/01/2020-11 AM-12 PM:  Today's visit was an in person visit was conducted in my outpatient clinic office with the patient myself present.  We have continue to work on issues associated with residual effects of his traumatic brain injury.  The patient has progressively increased responsibility at his job and is working as a Doctor, general practice and while it is quite frustrating to him that he is not able to do his previous occupation he has been getting very positive feedback from his employer and coworkers.  In fact, they did a special birthday card for him at work which was very positive and encouraging to him.  Patient reports he still struggles with his memory and that his employer makes concessions for he and other coworkers who have been dealing with various limitations.  The patient reports that he remains quite motivated.  He has been able to achieve some significant parental gains with his sons behavioral patterns which have also been reinforcing to him his ability to be a good father.  We have continue to work on coping issues and adjustment issues the patient remains highly motivated and an active participant in the therapeutic process.

## 2020-12-17 MED ORDER — METHYLPHENIDATE HCL ER (OSM) 36 MG PO TBCR: 36.0000 mg | EXTENDED_RELEASE_TABLET | Freq: Every day | ORAL | 0 refills | Status: DC

## 2020-12-17 NOTE — Telephone Encounter (Signed)
Med ordered for earlier date d/t travel conflict.

## 2020-12-29 ENCOUNTER — Ambulatory Visit: Payer: BC Managed Care – PPO | Admitting: Psychology

## 2021-01-04 DIAGNOSIS — F411 Generalized anxiety disorder: Secondary | ICD-10-CM | POA: Diagnosis not present

## 2021-01-04 DIAGNOSIS — I10 Essential (primary) hypertension: Secondary | ICD-10-CM | POA: Diagnosis not present

## 2021-01-04 DIAGNOSIS — F331 Major depressive disorder, recurrent, moderate: Secondary | ICD-10-CM | POA: Diagnosis not present

## 2021-01-04 DIAGNOSIS — G4700 Insomnia, unspecified: Secondary | ICD-10-CM | POA: Diagnosis not present

## 2021-01-05 ENCOUNTER — Encounter: Payer: Self-pay | Admitting: Physical Medicine & Rehabilitation

## 2021-01-05 ENCOUNTER — Other Ambulatory Visit: Payer: Self-pay

## 2021-01-05 ENCOUNTER — Encounter
Payer: BC Managed Care – PPO | Attending: Physical Medicine & Rehabilitation | Admitting: Physical Medicine & Rehabilitation

## 2021-01-05 VITALS — BP 128/76 | HR 58 | Temp 97.7°F | Ht 72.0 in | Wt 215.0 lb

## 2021-01-05 DIAGNOSIS — S066X1S Traumatic subarachnoid hemorrhage with loss of consciousness of 30 minutes or less, sequela: Secondary | ICD-10-CM

## 2021-01-05 DIAGNOSIS — S069X0S Unspecified intracranial injury without loss of consciousness, sequela: Secondary | ICD-10-CM | POA: Diagnosis not present

## 2021-01-05 DIAGNOSIS — F068 Other specified mental disorders due to known physiological condition: Secondary | ICD-10-CM | POA: Diagnosis not present

## 2021-01-05 DIAGNOSIS — H53461 Homonymous bilateral field defects, right side: Secondary | ICD-10-CM | POA: Diagnosis not present

## 2021-01-05 MED ORDER — METHYLPHENIDATE HCL ER (OSM) 36 MG PO TBCR: 36.0000 mg | EXTENDED_RELEASE_TABLET | Freq: Every day | ORAL | 0 refills | Status: DC

## 2021-01-05 NOTE — Progress Notes (Signed)
Subjective:    Patient ID: Albert [name], male    DOB: Feb 03, 1976, 45 y.o.   MRN: [id number]  HPI  Albert [name] is here in follow up of his TBI. He has been doing fairly well. His biggest issue has been falling asleep. He often is able to settle down. He is going up to bed at 9pm and often has difficulty settling down. His primary had recommended taking less concerta which affects his concentration. She also started him on zoloft recently and increased the dose to 22m this past week. It looks as if she is treating anxiety. His concerta dose is 379KWdaily.   He has taken two trips with his family since we last met. They both went well. [facility] [place] [facility].      Pain Inventory Average Pain 1 Pain Right Now 0 My pain is intermittent and sharp  LOCATION OF PAIN  hEADACHE  BOWEL Number of stools per week: 14 Oral laxative use No  Type of laxative N/A Enema or suppository use No  History of colostomy No  Incontinent No   BLADDER Normal In and out cath, frequency N/A Able to self cath No  Bladder incontinence No  Frequent urination No  Leakage with coughing No  Difficulty starting stream No  Incomplete bladder emptying No    Mobility ability to climb steps?  yes do you drive?  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  no  Physicians involved in your care Any changes since last visit?  no   Family History  Problem Relation Age of Onset  . Heart disease Father    Social History   Socioeconomic History  . Marital status: Married    Spouse name: Not on file  . Number of children: Not on file  . Years of education: Not on file  . Highest education level: Not on file  Occupational History  . Not on file  Tobacco Use  . Smoking status: Former Smoker    Types: Cigarettes    Quit date: 12/17/1999    Years since quitting: 21.0   . Smokeless tobacco: Never Used  Vaping Use  . Vaping Use: Never used  Substance and Sexual Activity  . Alcohol use: Yes  . Drug use: No  . Sexual activity: Not on file  Other Topics Concern  . Not on file  Social History Narrative  . Not on file   Social Determinants of Health   Financial Resource Strain: Not on file  Food Insecurity: Not on file  Transportation Needs: Not on file  Physical Activity: Not on file  Stress: Not on file  Social Connections: Not on file   Past Surgical History:  Procedure Laterality Date  . CRANIOPLASTY N/A 06/10/2013   Procedure: CRANIOPLASTY;  Surgeon: EFloyce Stakes MD;  [facility];  Service: Neurosurgery;  Laterality: N/A;  CRANIOPLASTY  . CRANIOTOMY Left 12/15/2012   Procedure: CRANIECTOMY HEMATOMA EVACUATION SUBDURAL WITH PLACEMENT OF BONE FLAP IN ABDOMINAL WALL;  Surgeon: EFloyce Stakes MD;  [facility];  Service: Neurosurgery;  Laterality: Left;  . CRANIOTOMY Left 12/28/2012   Procedure: CRANIOTOMY HEMATOMA EVACUATION SUBDURAL;  Surgeon: JErline Levine MD;  [facility];  Service: Neurosurgery;  Laterality: Left;  . PEG PLACEMENT N/A 12/31/2012   Procedure: PERCUTANEOUS ENDOSCOPIC GASTROSTOMY (PEG) PLACEMENT;  Surgeon: Zenovia Jarred, MD;  Location: [place];  Service: General;  Laterality: N/A;  bedside  peg  . PERCUTANEOUS TRACHEOSTOMY N/A 12/31/2012   Procedure: PERCUTANEOUS TRACHEOSTOMY (BEDSIDE);  Surgeon: Zenovia Jarred, MD;  Location: [place];  Service: General;  Laterality: N/A;  . WRIST SURGERY Right 2008   Past Medical History:  Diagnosis Date  . Headache(784.0)   . Rotator cuff injury    right  . Seizures ([place])    12/28/2012  . Stroke [facility])    some aphasia , weakness in the R arm , Out pt. rehab currently   . Swallowing problem    still relearning controlled swallowing - rec'ing speech therapy in rehab  . Traumatic brain injury ([place])    There were no vitals taken for this  visit.  Opioid Risk Score:   Fall Risk Score:  `1  Depression screen PHQ 2/9  Depression screen [facility] 2/9 09/01/2020 01/07/2020 03/11/2019 05/17/2016 04/30/2013  Decreased Interest 0 0 0 0 0  Down, Depressed, Hopeless 0 0 0 0 0  PHQ - 2 Score 0 0 0 0 0  Some recent data might be hidden   Review of Systems  Neurological: Positive for numbness and headaches.       Numbness in right foot  All other systems reviewed and are negative.      Objective:   Physical Exam  Constitutional: No distress . Vital signs reviewed. HEENT: EOMI, oral membranes moist Neck: supple Cardiovascular: RRR without murmur. No JVD    Respiratory/Chest: CTA Bilaterally without wheezes or rales. Normal effort    GI/Abdomen: BS +, non-tender, non-distended Ext: no clubbing, cyanosis, or edema Psych: pleasant and cooperative Neuro: focused attention. Sometimes forgetful but able to reminds himself.  Right HH remains.    Musculoskeletal:  normal gait.  Psych: pleasant and appropriate       Assessment:  1. 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 Status post craniotomy evacuation of hematoma. Persistent ongoing cognitive deficits as well as right visual field loss---compensates fairly nice 2. Old right shoulder injury, likely RTC tendonitis- generally resolved  3. Seizure post- traumatic  4. Scrotal swelling, pain----resolved 5. Post-traumatic headaches---resolved       Plan:  1. Maintain dose propranolol for headache and mood---this has been effective 2. Will continue with exelon at 4.57m BID for memory.  3. Concerta for attention and memory.   second rx for next mos               -We will continue the controlled substance monitoring program, this consists of regular clinic visits, examinations, routine drug screening, pill [place] well [place].                -Medication was refilled  and a second prescription was sent to the patient's pharmacy for next month.  .                -may call for RF in about 2 months 4. Continue keppra 5061mbid 5. DISCUSSED sleep hygiene with pt, including reading/later bedtime etc 6. 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  7. Don't believe he has anxiety but he can get anxious when he fatigues or is overwhelmed/overstimulated cognitively. I left it up to him as to whether he would take the zoloft.      Fifteen minutes of face to face patient care time were spent during this  visit. All questions were encouraged and answered.  Follow up with me in 6 mos .

## 2021-01-05 NOTE — Patient Instructions (Addendum)
PLEASE FEEL FREE TO CALL OUR OFFICE WITH ANY PROBLEMS OR QUESTIONS ([phone])  SLEEP 1. 10PM BEDTIME 2. BACKGROUND NOISE (OWN PERSONAL) 3. READ AN ARTICLE, BOOK TO HELP YOU WIND DOWN 4. TURN TV OFF 5. TAKE MELATONIN 9-9:30PM

## 2021-01-27 ENCOUNTER — Encounter: Payer: Self-pay | Admitting: Psychology

## 2021-01-27 ENCOUNTER — Encounter: Payer: BC Managed Care – PPO | Attending: Physical Medicine & Rehabilitation | Admitting: Psychology

## 2021-01-27 ENCOUNTER — Other Ambulatory Visit: Payer: Self-pay

## 2021-01-27 DIAGNOSIS — H53461 Homonymous bilateral field defects, right side: Secondary | ICD-10-CM | POA: Insufficient documentation

## 2021-01-27 DIAGNOSIS — S069X9S Unspecified intracranial injury with loss of consciousness of unspecified duration, sequela: Secondary | ICD-10-CM | POA: Insufficient documentation

## 2021-01-27 DIAGNOSIS — S066X1S Traumatic subarachnoid hemorrhage with loss of consciousness of 30 minutes or less, sequela: Secondary | ICD-10-CM | POA: Diagnosis not present

## 2021-01-27 DIAGNOSIS — F068 Other specified mental disorders due to known physiological condition: Secondary | ICD-10-CM | POA: Diagnosis not present

## 2021-01-27 DIAGNOSIS — F0630 Mood disorder due to known physiological condition, unspecified: Secondary | ICD-10-CM | POA: Diagnosis not present

## 2021-01-27 DIAGNOSIS — S069X0S Unspecified intracranial injury without loss of consciousness, sequela: Secondary | ICD-10-CM

## 2021-01-27 NOTE — Progress Notes (Signed)
01/27/2021 8 AM-9 AM:  Today's visit was an in person visit was conducted in my outpatient clinic office with the patient myself present.  The patient continues to struggle with memory issues but reports that he is doing better as far as adapting to them being aware of areas where memory creates significant impacts on his day-to-day functioning.  The patient reports that he has been doing better as far as remembering appointments but continues to struggle.  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  The patient reports that things are going fairly well at home and the significant psychosocial stressors that have been going on in the past have stabilized and calm down a good deal.  Today we spent some time talking about possible future treatment as a friend of his had given him information about benefits from micro dosing of various psychedelics like psilocybin or other micro dosing efforts for effects of memory following TBI.  We spent time discussing the current literature of these and that these initial efforts are fairly new and at this point there is no standardized strategy for implementing these.  There have been uses clinically and medical settings for ketamine treatments but the patient does not have any significant depression, PTSD or other issues that are usually combined when ketamine infusion therapies are utilized.  We continue to work on therapeutic interventions and the patient will return in approximately 1 month.

## 2021-02-15 DIAGNOSIS — F331 Major depressive disorder, recurrent, moderate: Secondary | ICD-10-CM | POA: Diagnosis not present

## 2021-02-15 DIAGNOSIS — Z23 Encounter for immunization: Secondary | ICD-10-CM | POA: Diagnosis not present

## 2021-02-15 DIAGNOSIS — G4700 Insomnia, unspecified: Secondary | ICD-10-CM | POA: Diagnosis not present

## 2021-02-15 DIAGNOSIS — F411 Generalized anxiety disorder: Secondary | ICD-10-CM | POA: Diagnosis not present

## 2021-02-15 DIAGNOSIS — I10 Essential (primary) hypertension: Secondary | ICD-10-CM | POA: Diagnosis not present

## 2021-02-22 ENCOUNTER — Other Ambulatory Visit: Payer: Self-pay

## 2021-02-22 DIAGNOSIS — S066X1S Traumatic subarachnoid hemorrhage with loss of consciousness of 30 minutes or less, sequela: Secondary | ICD-10-CM

## 2021-02-28 MED ORDER — METHYLPHENIDATE HCL ER (OSM) 36 MG PO TBCR
36.0000 mg | EXTENDED_RELEASE_TABLET | Freq: Every day | ORAL | 0 refills | Status: DC
Start: 2021-02-28 — End: 2021-03-25

## 2021-02-28 NOTE — Telephone Encounter (Signed)
PMP was Reviewd: Concerta e- scribed today. Sybil RN will call patient regarding the above.

## 2021-03-01 ENCOUNTER — Encounter: Payer: Self-pay | Admitting: Psychology

## 2021-03-01 ENCOUNTER — Other Ambulatory Visit: Payer: Self-pay

## 2021-03-01 ENCOUNTER — Encounter: Payer: BC Managed Care – PPO | Attending: Physical Medicine & Rehabilitation | Admitting: Psychology

## 2021-03-01 DIAGNOSIS — S069X9S Unspecified intracranial injury with loss of consciousness of unspecified duration, sequela: Secondary | ICD-10-CM

## 2021-03-01 DIAGNOSIS — S069XAS Unspecified intracranial injury with loss of consciousness status unknown, sequela: Secondary | ICD-10-CM

## 2021-03-01 DIAGNOSIS — S069X0S Unspecified intracranial injury without loss of consciousness, sequela: Secondary | ICD-10-CM

## 2021-03-01 DIAGNOSIS — S066X1S Traumatic subarachnoid hemorrhage with loss of consciousness of 30 minutes or less, sequela: Secondary | ICD-10-CM

## 2021-03-01 DIAGNOSIS — F068 Other specified mental disorders due to known physiological condition: Secondary | ICD-10-CM | POA: Diagnosis not present

## 2021-03-01 DIAGNOSIS — F0630 Mood disorder due to known physiological condition, unspecified: Secondary | ICD-10-CM | POA: Diagnosis not present

## 2021-03-01 DIAGNOSIS — H53461 Homonymous bilateral field defects, right side: Secondary | ICD-10-CM | POA: Insufficient documentation

## 2021-03-01 NOTE — Progress Notes (Signed)
03/01/2021 10 AM-11 AM: Today's visit was an in person visit was conducted in my outpatient clinic office with the patient myself present.    The patient is continued to struggle with concerns around financial limitations and conflicts between he and his wife regarding how financial resources are spent within the household.  There are also frustrations about the strategies for handling disruptive behaviors and the children.  The patient reports that overall his relationship with his wife is good but there are number of very stressful situations particularly around how she manages the aspects of the family's financial resources that she is in charge of.  The patient reports that he has been very careful with the limited amount of financial resources he has at his disposal and is always planning on long-term expected expenditures etc.  The patient reports that he has continued to work at his job and is glad he is able to do this but would like to progress into a more diverse/demanding type of job opportunities but his cognitive difficulties particularly memory deficits remain problematic.  The patient reports that he has not thought much more about the ketamine infusion therapies that potentially could help.  We continue to work on therapeutic interventions and the patient will return in approximately 1 month.

## 2021-03-25 ENCOUNTER — Other Ambulatory Visit: Payer: Self-pay

## 2021-03-25 DIAGNOSIS — S066X1S Traumatic subarachnoid hemorrhage with loss of consciousness of 30 minutes or less, sequela: Secondary | ICD-10-CM

## 2021-03-28 MED ORDER — METHYLPHENIDATE HCL ER (OSM) 36 MG PO TBCR
36.0000 mg | EXTENDED_RELEASE_TABLET | Freq: Every day | ORAL | 0 refills | Status: DC
Start: 2021-03-28 — End: 2021-04-21

## 2021-03-29 ENCOUNTER — Encounter: Payer: BC Managed Care – PPO | Attending: Physical Medicine & Rehabilitation | Admitting: Psychology

## 2021-03-29 ENCOUNTER — Encounter: Payer: Self-pay | Admitting: Psychology

## 2021-03-29 ENCOUNTER — Other Ambulatory Visit: Payer: Self-pay

## 2021-03-29 DIAGNOSIS — F068 Other specified mental disorders due to known physiological condition: Secondary | ICD-10-CM | POA: Diagnosis not present

## 2021-03-29 DIAGNOSIS — S066X1S Traumatic subarachnoid hemorrhage with loss of consciousness of 30 minutes or less, sequela: Secondary | ICD-10-CM

## 2021-03-29 DIAGNOSIS — S069X0S Unspecified intracranial injury without loss of consciousness, sequela: Secondary | ICD-10-CM

## 2021-03-29 DIAGNOSIS — H53461 Homonymous bilateral field defects, right side: Secondary | ICD-10-CM

## 2021-03-29 DIAGNOSIS — S069X9S Unspecified intracranial injury with loss of consciousness of unspecified duration, sequela: Secondary | ICD-10-CM | POA: Diagnosis not present

## 2021-03-29 DIAGNOSIS — F0630 Mood disorder due to known physiological condition, unspecified: Secondary | ICD-10-CM

## 2021-03-29 NOTE — Progress Notes (Signed)
03/29/2021: 10 AM-11 AM: Today's visit was an in person visit was conducted in my outpatient clinic office with the patient myself present.  Today we continue to work on therapeutic interventions around residual effects of his TBI.  The patient describes ongoing psychosocial stressors that are directly related to his degree of loss of function.  The patient reports that stressors within his family regarding the way they have been communicating and relating continue to be problematic and he has difficulty adjusting and coping to these.  The patient has been trying to work more with his job in [employer] to keep and is active as he can.  The patient reports that there are stressors going on with his marriage that are indirectly related to his TBI but also to some of the almost obsessive behaviors of his family related to interactions with cell phones and social media and games etc.  This is limiting the amount of direct person-to-person communication that he has with his wife and family which are stressful to him.  Today we worked on Radiographer, therapeutic and strategies around these issues.

## 2021-03-30 DIAGNOSIS — I10 Essential (primary) hypertension: Secondary | ICD-10-CM | POA: Diagnosis not present

## 2021-03-30 DIAGNOSIS — G4700 Insomnia, unspecified: Secondary | ICD-10-CM | POA: Diagnosis not present

## 2021-03-30 DIAGNOSIS — F331 Major depressive disorder, recurrent, moderate: Secondary | ICD-10-CM | POA: Diagnosis not present

## 2021-03-30 DIAGNOSIS — F411 Generalized anxiety disorder: Secondary | ICD-10-CM | POA: Diagnosis not present

## 2021-04-20 ENCOUNTER — Encounter: Payer: Self-pay | Admitting: Psychology

## 2021-04-20 ENCOUNTER — Other Ambulatory Visit: Payer: Self-pay

## 2021-04-20 ENCOUNTER — Encounter: Payer: BC Managed Care – PPO | Attending: Physical Medicine & Rehabilitation | Admitting: Psychology

## 2021-04-20 DIAGNOSIS — X58XXXS Exposure to other specified factors, sequela: Secondary | ICD-10-CM | POA: Diagnosis not present

## 2021-04-20 DIAGNOSIS — R4189 Other symptoms and signs involving cognitive functions and awareness: Secondary | ICD-10-CM | POA: Diagnosis not present

## 2021-04-20 DIAGNOSIS — S069X0S Unspecified intracranial injury without loss of consciousness, sequela: Secondary | ICD-10-CM

## 2021-04-20 DIAGNOSIS — S066X1S Traumatic subarachnoid hemorrhage with loss of consciousness of 30 minutes or less, sequela: Secondary | ICD-10-CM | POA: Insufficient documentation

## 2021-04-20 DIAGNOSIS — F068 Other specified mental disorders due to known physiological condition: Secondary | ICD-10-CM

## 2021-04-20 DIAGNOSIS — F0630 Mood disorder due to known physiological condition, unspecified: Secondary | ICD-10-CM | POA: Diagnosis not present

## 2021-04-20 DIAGNOSIS — H53461 Homonymous bilateral field defects, right side: Secondary | ICD-10-CM | POA: Insufficient documentation

## 2021-04-20 DIAGNOSIS — S069X9S Unspecified intracranial injury with loss of consciousness of unspecified duration, sequela: Secondary | ICD-10-CM | POA: Diagnosis not present

## 2021-04-20 NOTE — Progress Notes (Signed)
04/20/2021 9 AM-10 AM: Today's visit was an in person visit was conducted in my outpatient clinic office.  The patient, myself were present for this visit.  Will continue to work on therapeutic interventions around residual effects of his traumatic brain injury.  The patient reports that some of the psychosocial stressors that he has been dealing with have continued to improve and he is actually getting more attempts at help from his son although the son still has a lot to learn about taking care of animals and feeding and etc.  Stressors continue with his 16 year old daughter who at times can manipulate or attempt to manipulate situations because of the residual effects of the patient's CVA.  This is quite frustrating to the patient but he knows that over time that his daughter will continue to mature and adjust.  The patient reports that work situation continues to work go well and while he has thought about things that he could do more both at work he is also been going back to the traumatic brain injury support group held at the inpatient rehab unit each month and also looking for opportunities to be able to volunteer some more with the hospital now that [place] restrictions have been slowly changing.

## 2021-04-21 ENCOUNTER — Other Ambulatory Visit: Payer: Self-pay

## 2021-04-21 DIAGNOSIS — S066X1S Traumatic subarachnoid hemorrhage with loss of consciousness of 30 minutes or less, sequela: Secondary | ICD-10-CM

## 2021-04-21 NOTE — Telephone Encounter (Signed)
Last filled in PMP 03/29/2021. Patient has 10 tablets of Concerta on hand. Will you please send in a script for to be released next week?  Call back phone [phone].

## 2021-04-22 MED ORDER — METHYLPHENIDATE HCL ER (OSM) 36 MG PO TBCR
36.0000 mg | EXTENDED_RELEASE_TABLET | Freq: Every day | ORAL | 0 refills | Status: DC
Start: 2021-04-22 — End: 2021-05-25

## 2021-04-25 ENCOUNTER — Other Ambulatory Visit: Payer: Self-pay | Admitting: Physical Medicine & Rehabilitation

## 2021-04-25 DIAGNOSIS — F0630 Mood disorder due to known physiological condition, unspecified: Secondary | ICD-10-CM

## 2021-04-25 DIAGNOSIS — F068 Other specified mental disorders due to known physiological condition: Secondary | ICD-10-CM

## 2021-04-25 DIAGNOSIS — S069X0S Unspecified intracranial injury without loss of consciousness, sequela: Secondary | ICD-10-CM

## 2021-04-25 DIAGNOSIS — S069X9S Unspecified intracranial injury with loss of consciousness of unspecified duration, sequela: Secondary | ICD-10-CM

## 2021-04-25 DIAGNOSIS — S066X1S Traumatic subarachnoid hemorrhage with loss of consciousness of 30 minutes or less, sequela: Secondary | ICD-10-CM

## 2021-04-28 ENCOUNTER — Ambulatory Visit: Payer: BC Managed Care – PPO | Admitting: Psychology

## 2021-05-02 DIAGNOSIS — Z23 Encounter for immunization: Secondary | ICD-10-CM | POA: Diagnosis not present

## 2021-05-20 DIAGNOSIS — M9901 Segmental and somatic dysfunction of cervical region: Secondary | ICD-10-CM | POA: Diagnosis not present

## 2021-05-20 DIAGNOSIS — M9905 Segmental and somatic dysfunction of pelvic region: Secondary | ICD-10-CM | POA: Diagnosis not present

## 2021-05-20 DIAGNOSIS — F0781 Postconcussional syndrome: Secondary | ICD-10-CM | POA: Diagnosis not present

## 2021-05-20 DIAGNOSIS — M9902 Segmental and somatic dysfunction of thoracic region: Secondary | ICD-10-CM | POA: Diagnosis not present

## 2021-05-20 DIAGNOSIS — M2560 Stiffness of unspecified joint, not elsewhere classified: Secondary | ICD-10-CM | POA: Diagnosis not present

## 2021-05-20 DIAGNOSIS — M9903 Segmental and somatic dysfunction of lumbar region: Secondary | ICD-10-CM | POA: Diagnosis not present

## 2021-05-24 DIAGNOSIS — M2560 Stiffness of unspecified joint, not elsewhere classified: Secondary | ICD-10-CM | POA: Diagnosis not present

## 2021-05-24 DIAGNOSIS — F0781 Postconcussional syndrome: Secondary | ICD-10-CM | POA: Diagnosis not present

## 2021-05-24 DIAGNOSIS — M9905 Segmental and somatic dysfunction of pelvic region: Secondary | ICD-10-CM | POA: Diagnosis not present

## 2021-05-24 DIAGNOSIS — M9902 Segmental and somatic dysfunction of thoracic region: Secondary | ICD-10-CM | POA: Diagnosis not present

## 2021-05-24 DIAGNOSIS — M9903 Segmental and somatic dysfunction of lumbar region: Secondary | ICD-10-CM | POA: Diagnosis not present

## 2021-05-24 DIAGNOSIS — M9901 Segmental and somatic dysfunction of cervical region: Secondary | ICD-10-CM | POA: Diagnosis not present

## 2021-05-25 ENCOUNTER — Other Ambulatory Visit: Payer: Self-pay

## 2021-05-25 ENCOUNTER — Telehealth: Payer: Self-pay | Admitting: Physical Medicine & Rehabilitation

## 2021-05-25 DIAGNOSIS — S066X1S Traumatic subarachnoid hemorrhage with loss of consciousness of 30 minutes or less, sequela: Secondary | ICD-10-CM

## 2021-05-25 MED ORDER — METHYLPHENIDATE HCL ER (OSM) 36 MG PO TBCR: 36.0000 mg | EXTENDED_RELEASE_TABLET | Freq: Every day | ORAL | 0 refills | Status: DC

## 2021-05-25 NOTE — Telephone Encounter (Signed)
Patient is needing a refill on methylphenidate.   He only has 5 pills left, please call patient.

## 2021-05-25 NOTE — Telephone Encounter (Signed)
Notified. 

## 2021-05-31 ENCOUNTER — Encounter: Payer: BC Managed Care – PPO | Attending: Psychology | Admitting: Psychology

## 2021-05-31 ENCOUNTER — Other Ambulatory Visit: Payer: Self-pay

## 2021-05-31 DIAGNOSIS — F0630 Mood disorder due to known physiological condition, unspecified: Secondary | ICD-10-CM | POA: Insufficient documentation

## 2021-05-31 DIAGNOSIS — S066X1S Traumatic subarachnoid hemorrhage with loss of consciousness of 30 minutes or less, sequela: Secondary | ICD-10-CM | POA: Diagnosis not present

## 2021-05-31 DIAGNOSIS — X58XXXA Exposure to other specified factors, initial encounter: Secondary | ICD-10-CM | POA: Diagnosis not present

## 2021-05-31 DIAGNOSIS — H53461 Homonymous bilateral field defects, right side: Secondary | ICD-10-CM | POA: Insufficient documentation

## 2021-05-31 DIAGNOSIS — Z87820 Personal history of traumatic brain injury: Secondary | ICD-10-CM | POA: Insufficient documentation

## 2021-05-31 DIAGNOSIS — F068 Other specified mental disorders due to known physiological condition: Secondary | ICD-10-CM | POA: Diagnosis not present

## 2021-05-31 DIAGNOSIS — S069X0S Unspecified intracranial injury without loss of consciousness, sequela: Secondary | ICD-10-CM | POA: Insufficient documentation

## 2021-05-31 DIAGNOSIS — S069X9S Unspecified intracranial injury with loss of consciousness of unspecified duration, sequela: Secondary | ICD-10-CM | POA: Insufficient documentation

## 2021-06-01 DIAGNOSIS — H524 Presbyopia: Secondary | ICD-10-CM | POA: Diagnosis not present

## 2021-06-01 DIAGNOSIS — H5330 Unspecified disorder of binocular vision: Secondary | ICD-10-CM | POA: Diagnosis not present

## 2021-06-01 DIAGNOSIS — H539 Unspecified visual disturbance: Secondary | ICD-10-CM | POA: Diagnosis not present

## 2021-06-01 DIAGNOSIS — H5347 Heteronymous bilateral field defects: Secondary | ICD-10-CM | POA: Diagnosis not present

## 2021-06-01 DIAGNOSIS — I69398 Other sequelae of cerebral infarction: Secondary | ICD-10-CM | POA: Diagnosis not present

## 2021-06-01 DIAGNOSIS — H53483 Generalized contraction of visual field, bilateral: Secondary | ICD-10-CM | POA: Diagnosis not present

## 2021-06-05 NOTE — Progress Notes (Signed)
04/20/2021 9 AM-10 AM: Today's visit was an in person visit was conducted in my outpatient clinic office.  The patient, myself were present for this visit.  Will continue to work on therapeutic interventions around residual effects of his traumatic brain injury.  The patient reports that some of the psychosocial stressors that he has been dealing with have continued to improve and he is actually getting more attempts at help from his son although the son still has a lot to learn about taking care of animals and feeding and etc.  Stressors continue with his 16 year old daughter who at times can manipulate or attempt to manipulate situations because of the residual effects of the patient's CVA.  This is quite frustrating to the patient but he knows that over time that his daughter will continue to mature and adjust.  The patient reports that work situation continues to work go well and while he has thought about things that he could do more both at work he is also been going back to the traumatic brain injury support group held at the inpatient rehab unit each month and also looking for opportunities to be able to volunteer some more with the hospital now that [place] restrictions have been slowly changing.

## 2021-06-08 DIAGNOSIS — F0781 Postconcussional syndrome: Secondary | ICD-10-CM | POA: Diagnosis not present

## 2021-06-08 DIAGNOSIS — M9905 Segmental and somatic dysfunction of pelvic region: Secondary | ICD-10-CM | POA: Diagnosis not present

## 2021-06-08 DIAGNOSIS — M2560 Stiffness of unspecified joint, not elsewhere classified: Secondary | ICD-10-CM | POA: Diagnosis not present

## 2021-06-08 DIAGNOSIS — M9903 Segmental and somatic dysfunction of lumbar region: Secondary | ICD-10-CM | POA: Diagnosis not present

## 2021-06-08 DIAGNOSIS — M9901 Segmental and somatic dysfunction of cervical region: Secondary | ICD-10-CM | POA: Diagnosis not present

## 2021-06-08 DIAGNOSIS — M9902 Segmental and somatic dysfunction of thoracic region: Secondary | ICD-10-CM | POA: Diagnosis not present

## 2021-06-14 DIAGNOSIS — F0781 Postconcussional syndrome: Secondary | ICD-10-CM | POA: Diagnosis not present

## 2021-06-14 DIAGNOSIS — M9903 Segmental and somatic dysfunction of lumbar region: Secondary | ICD-10-CM | POA: Diagnosis not present

## 2021-06-14 DIAGNOSIS — M9905 Segmental and somatic dysfunction of pelvic region: Secondary | ICD-10-CM | POA: Diagnosis not present

## 2021-06-14 DIAGNOSIS — M2560 Stiffness of unspecified joint, not elsewhere classified: Secondary | ICD-10-CM | POA: Diagnosis not present

## 2021-06-14 DIAGNOSIS — M9902 Segmental and somatic dysfunction of thoracic region: Secondary | ICD-10-CM | POA: Diagnosis not present

## 2021-06-14 DIAGNOSIS — M9901 Segmental and somatic dysfunction of cervical region: Secondary | ICD-10-CM | POA: Diagnosis not present

## 2021-06-22 ENCOUNTER — Other Ambulatory Visit: Payer: Self-pay

## 2021-06-22 DIAGNOSIS — S066X1S Traumatic subarachnoid hemorrhage with loss of consciousness of 30 minutes or less, sequela: Secondary | ICD-10-CM

## 2021-06-22 MED ORDER — METHYLPHENIDATE HCL ER (OSM) 36 MG PO TBCR: 36.0000 mg | EXTENDED_RELEASE_TABLET | Freq: Every day | ORAL | 0 refills | Status: DC

## 2021-06-29 ENCOUNTER — Other Ambulatory Visit: Payer: Self-pay

## 2021-06-29 ENCOUNTER — Encounter: Payer: BC Managed Care – PPO | Attending: Physical Medicine & Rehabilitation | Admitting: Psychology

## 2021-06-29 DIAGNOSIS — H53461 Homonymous bilateral field defects, right side: Secondary | ICD-10-CM | POA: Diagnosis not present

## 2021-06-29 DIAGNOSIS — S066X1S Traumatic subarachnoid hemorrhage with loss of consciousness of 30 minutes or less, sequela: Secondary | ICD-10-CM

## 2021-06-29 DIAGNOSIS — F0630 Mood disorder due to known physiological condition, unspecified: Secondary | ICD-10-CM | POA: Diagnosis not present

## 2021-06-29 DIAGNOSIS — F068 Other specified mental disorders due to known physiological condition: Secondary | ICD-10-CM | POA: Diagnosis not present

## 2021-06-29 DIAGNOSIS — S069X0S Unspecified intracranial injury without loss of consciousness, sequela: Secondary | ICD-10-CM | POA: Diagnosis not present

## 2021-06-29 DIAGNOSIS — S069XAS Unspecified intracranial injury with loss of consciousness status unknown, sequela: Secondary | ICD-10-CM

## 2021-06-30 ENCOUNTER — Ambulatory Visit: Payer: BC Managed Care – PPO | Admitting: Psychology

## 2021-07-06 ENCOUNTER — Encounter: Payer: Self-pay | Admitting: Psychology

## 2021-07-06 ENCOUNTER — Ambulatory Visit: Payer: BC Managed Care – PPO | Admitting: Physical Medicine & Rehabilitation

## 2021-07-06 NOTE — Progress Notes (Signed)
06/29/2021 9 AM-10 AM: Today's visit was an in person visit that was conducted in my outpatient clinic office with the patient myself present.  It was 1 hour in duration.  The patient was well-groomed and oriented with good mental status although he continues to have attention and concentration and memory deficits.  We have continue to work on therapeutic interventions around residual effects of his traumatic brain injury.  The patient does continue to work as a Doctor, general practice at [employer] and has been attempting to pick up shifts as he enjoys doing as much as he can.  Memory and executive functioning deficits continue and the patient has not been able to return to his previous level of occupation.  The patient is working hard on being as active as he can and coping with stressors and the impacts of his residual cognitive deficits have on him managing specific psychosocial stressors.

## 2021-07-13 ENCOUNTER — Encounter
Payer: BC Managed Care – PPO | Attending: Physical Medicine & Rehabilitation | Admitting: Physical Medicine & Rehabilitation

## 2021-07-13 ENCOUNTER — Other Ambulatory Visit: Payer: Self-pay

## 2021-07-13 ENCOUNTER — Encounter: Payer: Self-pay | Admitting: Physical Medicine & Rehabilitation

## 2021-07-13 ENCOUNTER — Encounter: Payer: BC Managed Care – PPO | Admitting: Physical Medicine & Rehabilitation

## 2021-07-13 VITALS — BP 108/72 | HR 66 | Temp 98.2°F | Ht 72.0 in | Wt 220.0 lb

## 2021-07-13 DIAGNOSIS — S066X1S Traumatic subarachnoid hemorrhage with loss of consciousness of 30 minutes or less, sequela: Secondary | ICD-10-CM | POA: Diagnosis not present

## 2021-07-13 DIAGNOSIS — F0670 Mild neurocognitive disorder due to known physiological condition without behavioral disturbance: Secondary | ICD-10-CM | POA: Diagnosis not present

## 2021-07-13 DIAGNOSIS — H5461 Unqualified visual loss, right eye, normal vision left eye: Secondary | ICD-10-CM | POA: Diagnosis not present

## 2021-07-13 DIAGNOSIS — F068 Other specified mental disorders due to known physiological condition: Secondary | ICD-10-CM | POA: Diagnosis not present

## 2021-07-13 DIAGNOSIS — H53461 Homonymous bilateral field defects, right side: Secondary | ICD-10-CM | POA: Diagnosis not present

## 2021-07-13 DIAGNOSIS — Z87820 Personal history of traumatic brain injury: Secondary | ICD-10-CM | POA: Insufficient documentation

## 2021-07-13 DIAGNOSIS — F0630 Mood disorder due to known physiological condition, unspecified: Secondary | ICD-10-CM | POA: Insufficient documentation

## 2021-07-13 DIAGNOSIS — X58XXXS Exposure to other specified factors, sequela: Secondary | ICD-10-CM | POA: Insufficient documentation

## 2021-07-13 DIAGNOSIS — S069X0S Unspecified intracranial injury without loss of consciousness, sequela: Secondary | ICD-10-CM | POA: Diagnosis not present

## 2021-07-13 DIAGNOSIS — R4189 Other symptoms and signs involving cognitive functions and awareness: Secondary | ICD-10-CM | POA: Diagnosis not present

## 2021-07-13 DIAGNOSIS — S069XAS Unspecified intracranial injury with loss of consciousness status unknown, sequela: Secondary | ICD-10-CM | POA: Diagnosis not present

## 2021-07-13 MED ORDER — METHYLPHENIDATE HCL ER (OSM) 36 MG PO TBCR: 36.0000 mg | EXTENDED_RELEASE_TABLET | Freq: Every day | ORAL | 0 refills | Status: DC

## 2021-07-13 NOTE — Patient Instructions (Signed)
PLEASE FEEL FREE TO CALL OUR OFFICE WITH ANY PROBLEMS OR QUESTIONS ([phone])      

## 2021-07-13 NOTE — Progress Notes (Signed)
Subjective:    Patient ID: Albert [name], male    DOB: 01-25-1976, 45 y.o.   MRN: [id number]  HPI  Albert [name] is here in follow up of his TBI and associated deficits.  He tells me that he has been doing well since we last talked.  He remains active with his family.  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  He uses scat transport to appointments and for mobility within the community.  He denies any headaches.  His memory and concentration are at baseline and he continues with Concerta to help boost his concentration levels.  He uses Exelon for memory supplementation.    Pain Inventory Average Pain 0 Pain Right Now 0 My pain is  no pain  In the last 24 hours, has pain interfered with the following? General activity 0 Relation with others 0 Enjoyment of life 0 What TIME of day is your pain at its worst? na Sleep (in general) Fair  Pain is worse with:  high pitch noises Pain improves with:  quite Relief from Meds:  na  Family History  Problem Relation Age of Onset   Heart disease Father    Social History   Socioeconomic History   Marital status: Married    Spouse name: Not on file   Number of children: Not on file   Years of education: Not on file   Highest education level: Not on file  Occupational History   Not on file  Tobacco Use   Smoking status: Former    Types: Cigarettes    Quit date: 12/17/1999    Years since quitting: 21.5   Smokeless tobacco: Never  Vaping Use   Vaping Use: Never used  Substance and Sexual Activity   Alcohol use: Yes   Drug use: No   Sexual activity: Not on file  Other Topics Concern   Not on file  Social History Narrative   Not on file   Social Determinants of Health   Financial Resource Strain: Not on file  Food Insecurity: Not on file  Transportation Needs: Not on file  Physical Activity: Not on file  Stress: Not on file  Social Connections: Not on file   Past Surgical History:  Procedure Laterality Date    CRANIOPLASTY N/A 06/10/2013   Procedure: CRANIOPLASTY;  Surgeon: Floyce Stakes, MD;  Location: [facility];  Service: Neurosurgery;  Laterality: N/A;  CRANIOPLASTY   CRANIOTOMY Left 12/15/2012   Procedure: CRANIECTOMY HEMATOMA EVACUATION SUBDURAL WITH PLACEMENT OF BONE FLAP IN ABDOMINAL WALL;  Surgeon: Floyce Stakes, MD;  Location: [place] [facility];  Service: Neurosurgery;  Laterality: Left;   CRANIOTOMY Left 12/28/2012   Procedure: CRANIOTOMY HEMATOMA EVACUATION SUBDURAL;  Surgeon: Erline Levine, MD;  Location: [place] [facility];  Service: Neurosurgery;  Laterality: Left;   PEG PLACEMENT N/A 12/31/2012   Procedure: PERCUTANEOUS ENDOSCOPIC GASTROSTOMY (PEG) PLACEMENT;  Surgeon: Zenovia Jarred, MD;  Location: [place];  Service: General;  Laterality: N/A;  bedside  peg   PERCUTANEOUS TRACHEOSTOMY N/A 12/31/2012   Procedure: PERCUTANEOUS TRACHEOSTOMY (BEDSIDE);  Surgeon: Zenovia Jarred, MD;  Location: [place];  Service: General;  Laterality: N/A;   WRIST SURGERY Right 2008   Past Surgical History:  Procedure Laterality Date   CRANIOPLASTY N/A 06/10/2013   Procedure: CRANIOPLASTY;  Surgeon: Floyce Stakes, MD;  Location: [place] [facility];  Service: Neurosurgery;  Laterality: N/A;  CRANIOPLASTY   CRANIOTOMY Left 12/15/2012   Procedure: CRANIECTOMY HEMATOMA EVACUATION SUBDURAL WITH  PLACEMENT OF BONE FLAP IN ABDOMINAL WALL;  Surgeon: Floyce Stakes, MD;  [facility]: [facility];  Service: Neurosurgery;  Laterality: Left;   CRANIOTOMY Left 12/28/2012   Procedure: CRANIOTOMY HEMATOMA EVACUATION SUBDURAL;  Surgeon: Erline Levine, MD;  Location: [place] [facility];  Service: Neurosurgery;  Laterality: Left;   PEG PLACEMENT N/A 12/31/2012   Procedure: PERCUTANEOUS ENDOSCOPIC GASTROSTOMY (PEG) PLACEMENT;  Surgeon: Zenovia Jarred, MD;  Location: [place];  Service: General;  Laterality: N/A;  bedside  peg   PERCUTANEOUS TRACHEOSTOMY N/A 12/31/2012   Procedure: PERCUTANEOUS TRACHEOSTOMY (BEDSIDE);  Surgeon: Zenovia Jarred, MD;  Location: [place];  Service: General;  Laterality: N/A;   WRIST SURGERY Right 2008   Past Medical History:  Diagnosis Date   Headache(784.0)    Rotator cuff injury    right   Seizures ([place])    12/28/2012   Stroke ([place])    some aphasia , weakness in the R arm , Out pt. rehab currently    Swallowing problem    still relearning controlled swallowing - rec'ing speech therapy in rehab   Traumatic brain injury    BP 108/72   Pulse 66   Temp 98.2 F (36.8 C) (Oral)   Ht 6' (1.829 m)   Wt 220 lb (99.8 kg)   SpO2 95%   BMI 29.84 kg/m   Opioid Risk Score:   Fall Risk Score:  `1  Depression screen PHQ 2/9  Depression screen [facility] 2/9 01/05/2021 09/01/2020 01/07/2020 03/11/2019 05/17/2016  Decreased Interest 0 0 0 0 0  Down, Depressed, Hopeless 0 0 0 0 0  PHQ - 2 Score 0 0 0 0 0  Some recent data might be hidden      Review of Systems  Neurological:  Positive for headaches.  All other systems reviewed and are negative.     Objective:   Physical Exam  General: No acute distress HEENT: NCAT, EOMI, oral membranes moist Cards: reg rate  Chest: normal effort Abdomen: Soft, NT, ND Skin: dry, intact Extremities: no edema Psych: pleasant and appropriate  Neuro: focused attention. Sometimes forgetful but able to reminds himself.  Right HH ongoing  Musculoskeletal:  normal gait.          Assessment:  1. 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 Status post craniotomy evacuation of hematoma. Persistent ongoing cognitive deficits as well as right visual field loss---compensates fairly nice 2. Old right shoulder injury, likely RTC tendonitis- generally resolved  3. Seizure post- traumatic  4. Scrotal swelling, pain----resolved 5. Post-traumatic headaches---resolved       Plan:  1. Maintain dose propranolol for headache and mood---this has been effective 2. Will continue with exelon at 4.5mg  BID for memory.  3. Concerta for  attention and memory.   second rx for next mos               We will continue the controlled substance monitoring program, this consists of regular clinic visits, examinations, routine drug screening, pill counts as well as use of [place] Controlled Substance Reporting System. [place].                 -Medication was refilled and a second prescription was sent to the patient's pharmacy for next month.  .                -may call for RF in about 2 months 4. Continue keppra 500mg  bid 5. Reiterated sleep hygiene  with pt, including reading/later bedtime etc 6. 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      15 minutes of face to face patient care time were spent during this visit. All questions were encouraged and answered.  Follow up with me in 6 mos .

## 2021-07-27 ENCOUNTER — Other Ambulatory Visit: Payer: Self-pay

## 2021-07-27 ENCOUNTER — Encounter (HOSPITAL_BASED_OUTPATIENT_CLINIC_OR_DEPARTMENT_OTHER): Payer: BC Managed Care – PPO | Admitting: Psychology

## 2021-07-27 DIAGNOSIS — R4189 Other symptoms and signs involving cognitive functions and awareness: Secondary | ICD-10-CM | POA: Diagnosis not present

## 2021-07-27 DIAGNOSIS — Z87820 Personal history of traumatic brain injury: Secondary | ICD-10-CM | POA: Diagnosis not present

## 2021-07-27 DIAGNOSIS — S066X1S Traumatic subarachnoid hemorrhage with loss of consciousness of 30 minutes or less, sequela: Secondary | ICD-10-CM

## 2021-07-27 DIAGNOSIS — F0670 Mild neurocognitive disorder due to known physiological condition without behavioral disturbance: Secondary | ICD-10-CM | POA: Diagnosis not present

## 2021-07-27 DIAGNOSIS — F0630 Mood disorder due to known physiological condition, unspecified: Secondary | ICD-10-CM

## 2021-07-27 DIAGNOSIS — H53461 Homonymous bilateral field defects, right side: Secondary | ICD-10-CM

## 2021-07-27 DIAGNOSIS — F068 Other specified mental disorders due to known physiological condition: Secondary | ICD-10-CM

## 2021-07-27 DIAGNOSIS — S069XAS Unspecified intracranial injury with loss of consciousness status unknown, sequela: Secondary | ICD-10-CM | POA: Diagnosis not present

## 2021-07-27 DIAGNOSIS — S069X0S Unspecified intracranial injury without loss of consciousness, sequela: Secondary | ICD-10-CM

## 2021-07-28 ENCOUNTER — Encounter: Payer: Self-pay | Admitting: Psychology

## 2021-07-28 ENCOUNTER — Ambulatory Visit: Payer: BC Managed Care – PPO | Admitting: Psychology

## 2021-07-28 NOTE — Progress Notes (Signed)
07/27/2021 9 AM-10 AM:  Today's visit was an in person visit was conducted in my outpatient clinic office with the patient and myself present.  It was 1 hour in duration.  Patient displayed good mental status but identifying issues with attention and concentration and memory deficits but oriented x4.  Patient was well-groomed and appropriate.  Therapeutic interventions continue around residual effects of his traumatic brain injury from a cerebrovascular event.  The patient continues to work as a Doctor, general practice at [employer] and has done well there for the most part but has to cope.  He has been having more orthopedic issues with his feet that have to be addressed.  The patient reports that he is constantly having a challenge with his memory and problem solving issues at work.  He is doing better at home with managing and helping out with the kids.  There have been some significant psychosocial stressors that have improved within his household.  We continue to work on therapeutic interventions around coping and management with residual effects of his cerebrovascular accident.

## 2021-08-09 ENCOUNTER — Other Ambulatory Visit: Payer: Self-pay | Admitting: Physical Medicine & Rehabilitation

## 2021-08-09 DIAGNOSIS — S066X1S Traumatic subarachnoid hemorrhage with loss of consciousness of 30 minutes or less, sequela: Secondary | ICD-10-CM

## 2021-08-09 MED ORDER — METHYLPHENIDATE HCL ER (OSM) 36 MG PO TBCR: 36.0000 mg | EXTENDED_RELEASE_TABLET | Freq: Every day | ORAL | 0 refills | Status: DC

## 2021-08-23 ENCOUNTER — Other Ambulatory Visit: Payer: Self-pay | Admitting: Physical Medicine & Rehabilitation

## 2021-08-23 DIAGNOSIS — F0630 Mood disorder due to known physiological condition, unspecified: Secondary | ICD-10-CM

## 2021-08-23 DIAGNOSIS — S066X1S Traumatic subarachnoid hemorrhage with loss of consciousness of 30 minutes or less, sequela: Secondary | ICD-10-CM

## 2021-08-30 ENCOUNTER — Encounter: Payer: BC Managed Care – PPO | Attending: Psychology | Admitting: Psychology

## 2021-08-30 ENCOUNTER — Other Ambulatory Visit: Payer: Self-pay

## 2021-08-30 DIAGNOSIS — X58XXXS Exposure to other specified factors, sequela: Secondary | ICD-10-CM | POA: Diagnosis not present

## 2021-08-30 DIAGNOSIS — S069XAS Unspecified intracranial injury with loss of consciousness status unknown, sequela: Secondary | ICD-10-CM | POA: Insufficient documentation

## 2021-08-30 DIAGNOSIS — F068 Other specified mental disorders due to known physiological condition: Secondary | ICD-10-CM | POA: Diagnosis not present

## 2021-08-30 DIAGNOSIS — H53461 Homonymous bilateral field defects, right side: Secondary | ICD-10-CM | POA: Diagnosis not present

## 2021-08-30 DIAGNOSIS — F0630 Mood disorder due to known physiological condition, unspecified: Secondary | ICD-10-CM | POA: Insufficient documentation

## 2021-08-30 DIAGNOSIS — S066X1S Traumatic subarachnoid hemorrhage with loss of consciousness of 30 minutes or less, sequela: Secondary | ICD-10-CM | POA: Insufficient documentation

## 2021-08-30 DIAGNOSIS — S069X0S Unspecified intracranial injury without loss of consciousness, sequela: Secondary | ICD-10-CM | POA: Diagnosis not present

## 2021-08-31 ENCOUNTER — Other Ambulatory Visit: Payer: Self-pay | Admitting: Physical Medicine & Rehabilitation

## 2021-08-31 ENCOUNTER — Encounter: Payer: Self-pay | Admitting: Psychology

## 2021-08-31 ENCOUNTER — Encounter: Payer: Self-pay | Admitting: Physical Medicine & Rehabilitation

## 2021-08-31 DIAGNOSIS — S066X1S Traumatic subarachnoid hemorrhage with loss of consciousness of 30 minutes or less, sequela: Secondary | ICD-10-CM

## 2021-08-31 MED ORDER — METHYLPHENIDATE HCL ER (OSM) 36 MG PO TBCR: 36.0000 mg | EXTENDED_RELEASE_TABLET | Freq: Every day | ORAL | 0 refills | Status: DC

## 2021-08-31 NOTE — Progress Notes (Signed)
08/30/2021 10 AM-11  AM:  Today's visit was an in person visit as conducted in outpatient clinic office with the patient myself present.  We spent 1 hour going over her current symptoms and how he is coping and managing with ongoing memory and cognitive difficulties.  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  The patient reports that this is rewarding for him and while he knows he continues to have difficulty with his memory is adapting to the situation and doing well and enjoys spending time with customers and interacting as much as he can.  The patient reports that his mood status remains good and that he is continuing to try to expand and work on residual effects of his cerebrovascular event.

## 2021-09-27 DIAGNOSIS — H524 Presbyopia: Secondary | ICD-10-CM | POA: Diagnosis not present

## 2021-09-27 DIAGNOSIS — H52223 Regular astigmatism, bilateral: Secondary | ICD-10-CM | POA: Diagnosis not present

## 2021-09-27 DIAGNOSIS — H5330 Unspecified disorder of binocular vision: Secondary | ICD-10-CM | POA: Diagnosis not present

## 2021-09-27 DIAGNOSIS — I69398 Other sequelae of cerebral infarction: Secondary | ICD-10-CM | POA: Diagnosis not present

## 2021-09-27 DIAGNOSIS — H5347 Heteronymous bilateral field defects: Secondary | ICD-10-CM | POA: Diagnosis not present

## 2021-10-05 ENCOUNTER — Other Ambulatory Visit: Payer: Self-pay | Admitting: Physical Medicine & Rehabilitation

## 2021-10-05 DIAGNOSIS — S066X1S Traumatic subarachnoid hemorrhage with loss of consciousness of 30 minutes or less, sequela: Secondary | ICD-10-CM

## 2021-10-05 MED ORDER — METHYLPHENIDATE HCL ER (OSM) 36 MG PO TBCR: 36.0000 mg | EXTENDED_RELEASE_TABLET | Freq: Every day | ORAL | 0 refills | Status: DC

## 2021-10-05 NOTE — Telephone Encounter (Signed)
Last filled #30 on 09/05/21, no more refills at pharmacy. Next appt 01/11/22

## 2021-10-07 ENCOUNTER — Telehealth: Payer: Self-pay

## 2021-10-07 MED ORDER — METHYLPHENIDATE HCL ER (OSM) 36 MG PO TBCR: 36.0000 mg | EXTENDED_RELEASE_TABLET | Freq: Every day | ORAL | 0 refills | Status: DC

## 2021-10-07 NOTE — Telephone Encounter (Signed)
PMP was Reviewed.  Methylphenidate e-scribed

## 2021-10-07 NOTE — Telephone Encounter (Signed)
Methylphenidate 36 mg is on back order at [facility] but [facility] Has it. Can you send this prescription in Dr. Naaman Plummer absence? Patient is almost out.

## 2021-10-24 ENCOUNTER — Other Ambulatory Visit: Payer: Self-pay | Admitting: Physical Medicine & Rehabilitation

## 2021-10-24 DIAGNOSIS — S069XAS Unspecified intracranial injury with loss of consciousness status unknown, sequela: Secondary | ICD-10-CM

## 2021-10-24 DIAGNOSIS — F068 Other specified mental disorders due to known physiological condition: Secondary | ICD-10-CM

## 2021-10-24 DIAGNOSIS — S066X1S Traumatic subarachnoid hemorrhage with loss of consciousness of 30 minutes or less, sequela: Secondary | ICD-10-CM

## 2021-10-24 DIAGNOSIS — F0630 Mood disorder due to known physiological condition, unspecified: Secondary | ICD-10-CM

## 2021-10-26 ENCOUNTER — Telehealth: Payer: Self-pay

## 2021-10-26 DIAGNOSIS — S066X1S Traumatic subarachnoid hemorrhage with loss of consciousness of 30 minutes or less, sequela: Secondary | ICD-10-CM

## 2021-10-26 MED ORDER — METHYLPHENIDATE HCL ER (OSM) 36 MG PO TBCR: 36.0000 mg | EXTENDED_RELEASE_TABLET | Freq: Every day | ORAL | 0 refills | Status: DC

## 2021-10-26 NOTE — Telephone Encounter (Signed)
Filled  Written  ID  Drug  QTY  Days  Prescriber  RX #  Dispenser  Refill  Daily Dose*  Pymt Type  PMP  10/07/2021 10/07/2021 3  Methylphenidate Er 36 Mg Tab 30.00 30 Eu Tho [id number] Har ([id number]) 0/0  [employer]

## 2021-10-26 NOTE — Telephone Encounter (Signed)
RF signed and sent thx

## 2021-11-03 ENCOUNTER — Other Ambulatory Visit: Payer: Self-pay | Admitting: Physical Medicine & Rehabilitation

## 2021-11-08 ENCOUNTER — Other Ambulatory Visit: Payer: Self-pay

## 2021-11-08 ENCOUNTER — Encounter: Payer: BC Managed Care – PPO | Attending: Psychology | Admitting: Psychology

## 2021-11-08 DIAGNOSIS — F068 Other specified mental disorders due to known physiological condition: Secondary | ICD-10-CM

## 2021-11-08 DIAGNOSIS — F0630 Mood disorder due to known physiological condition, unspecified: Secondary | ICD-10-CM | POA: Diagnosis not present

## 2021-11-08 DIAGNOSIS — S069X0S Unspecified intracranial injury without loss of consciousness, sequela: Secondary | ICD-10-CM | POA: Diagnosis not present

## 2021-11-08 DIAGNOSIS — H53461 Homonymous bilateral field defects, right side: Secondary | ICD-10-CM | POA: Diagnosis not present

## 2021-11-08 DIAGNOSIS — S069XAS Unspecified intracranial injury with loss of consciousness status unknown, sequela: Secondary | ICD-10-CM

## 2021-11-08 DIAGNOSIS — S066X1S Traumatic subarachnoid hemorrhage with loss of consciousness of 30 minutes or less, sequela: Secondary | ICD-10-CM | POA: Diagnosis not present

## 2021-11-15 ENCOUNTER — Encounter: Payer: Self-pay | Admitting: Psychology

## 2021-11-15 NOTE — Progress Notes (Signed)
11/08/2021 9 AM-10 AM:  Today's visit was an in person visit was conducted in my outpatient clinic office with the patient myself present.  1 hour spent going over her current symptoms and continue to work on coping and management skills due to ongoing memory and cognitive difficulties.  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  The patient continues to be rewarded by this but memory issues do challenge him issues with working as a Doctor, general practice.  The patient reports that they have continued to be some home stresses that we continue to work on building better coping skills and strategies particular around residual effects of his cerebrovascular accident and changes that have happened within the family dynamics.

## 2021-11-16 DIAGNOSIS — Z125 Encounter for screening for malignant neoplasm of prostate: Secondary | ICD-10-CM | POA: Diagnosis not present

## 2021-11-16 DIAGNOSIS — Z0000 Encounter for general adult medical examination without abnormal findings: Secondary | ICD-10-CM | POA: Diagnosis not present

## 2021-11-22 DIAGNOSIS — Z23 Encounter for immunization: Secondary | ICD-10-CM | POA: Diagnosis not present

## 2021-11-22 DIAGNOSIS — Z0000 Encounter for general adult medical examination without abnormal findings: Secondary | ICD-10-CM | POA: Diagnosis not present

## 2021-11-29 ENCOUNTER — Other Ambulatory Visit: Payer: Self-pay | Admitting: Physical Medicine & Rehabilitation

## 2021-11-29 DIAGNOSIS — S066X1S Traumatic subarachnoid hemorrhage with loss of consciousness of 30 minutes or less, sequela: Secondary | ICD-10-CM

## 2021-11-30 MED ORDER — METHYLPHENIDATE HCL ER (OSM) 36 MG PO TBCR: 36.0000 mg | EXTENDED_RELEASE_TABLET | Freq: Every day | ORAL | 0 refills | Status: DC

## 2021-11-30 NOTE — Telephone Encounter (Signed)
Rxs sent

## 2021-12-02 ENCOUNTER — Other Ambulatory Visit: Payer: Self-pay | Admitting: Physical Medicine & Rehabilitation

## 2021-12-02 DIAGNOSIS — S066X1S Traumatic subarachnoid hemorrhage with loss of consciousness of 30 minutes or less, sequela: Secondary | ICD-10-CM

## 2021-12-06 ENCOUNTER — Other Ambulatory Visit: Payer: Self-pay

## 2021-12-06 ENCOUNTER — Encounter: Payer: BC Managed Care – PPO | Attending: Psychology | Admitting: Psychology

## 2021-12-06 DIAGNOSIS — S066X1S Traumatic subarachnoid hemorrhage with loss of consciousness of 30 minutes or less, sequela: Secondary | ICD-10-CM | POA: Diagnosis not present

## 2021-12-06 DIAGNOSIS — F068 Other specified mental disorders due to known physiological condition: Secondary | ICD-10-CM | POA: Diagnosis not present

## 2021-12-06 DIAGNOSIS — Z8679 Personal history of other diseases of the circulatory system: Secondary | ICD-10-CM

## 2021-12-06 DIAGNOSIS — I69011 Memory deficit following nontraumatic subarachnoid hemorrhage: Secondary | ICD-10-CM | POA: Diagnosis not present

## 2021-12-06 DIAGNOSIS — F0630 Mood disorder due to known physiological condition, unspecified: Secondary | ICD-10-CM | POA: Insufficient documentation

## 2021-12-06 DIAGNOSIS — S069XAS Unspecified intracranial injury with loss of consciousness status unknown, sequela: Secondary | ICD-10-CM | POA: Diagnosis not present

## 2021-12-06 DIAGNOSIS — H53461 Homonymous bilateral field defects, right side: Secondary | ICD-10-CM | POA: Diagnosis not present

## 2021-12-06 DIAGNOSIS — I69012 Visuospatial deficit and spatial neglect following nontraumatic subarachnoid hemorrhage: Secondary | ICD-10-CM | POA: Diagnosis not present

## 2021-12-06 DIAGNOSIS — S069X0S Unspecified intracranial injury without loss of consciousness, sequela: Secondary | ICD-10-CM | POA: Diagnosis not present

## 2021-12-06 DIAGNOSIS — I69015 Cognitive social or emotional deficit following nontraumatic subarachnoid hemorrhage: Secondary | ICD-10-CM | POA: Diagnosis not present

## 2021-12-07 ENCOUNTER — Encounter: Payer: Self-pay | Admitting: Psychology

## 2021-12-07 NOTE — Progress Notes (Signed)
12/07/2018 3:10 AM-11 AM: ? ?Today's visit was an in person visit was conducted in my outpatient clinic office with the patient myself present.  1 hour spent going over her current symptoms and continue to work on coping and management skills due to ongoing memory and cognitive difficulties. ? 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  The patient continues to struggle with his memory and cognitive functioning.  He was able to go to a traumatic brain injury event at [facility] health put on and was able to have some of his friends come and they went through a number of different events including displays about limited vision after TBI.  The patient reports that it was very helpful for him to have others see what it is like when his right visual neglect impairments are experienced so people can appreciate what he goes through. ? ? ? ? ? ? ? ? ? ? ? ? ? ? ? ? ? ? ? ? ? ?

## 2021-12-25 ENCOUNTER — Other Ambulatory Visit: Payer: Self-pay | Admitting: Physical Medicine & Rehabilitation

## 2021-12-25 DIAGNOSIS — S066X1S Traumatic subarachnoid hemorrhage with loss of consciousness of 30 minutes or less, sequela: Secondary | ICD-10-CM

## 2021-12-27 ENCOUNTER — Encounter: Payer: BC Managed Care – PPO | Attending: Psychology | Admitting: Psychology

## 2021-12-27 DIAGNOSIS — I69311 Memory deficit following cerebral infarction: Secondary | ICD-10-CM | POA: Diagnosis not present

## 2021-12-27 DIAGNOSIS — I69398 Other sequelae of cerebral infarction: Secondary | ICD-10-CM | POA: Diagnosis not present

## 2021-12-27 DIAGNOSIS — F0630 Mood disorder due to known physiological condition, unspecified: Secondary | ICD-10-CM | POA: Diagnosis not present

## 2021-12-27 DIAGNOSIS — H53461 Homonymous bilateral field defects, right side: Secondary | ICD-10-CM | POA: Diagnosis not present

## 2021-12-27 DIAGNOSIS — S069X0S Unspecified intracranial injury without loss of consciousness, sequela: Secondary | ICD-10-CM | POA: Diagnosis present

## 2021-12-27 DIAGNOSIS — S069XAS Unspecified intracranial injury with loss of consciousness status unknown, sequela: Secondary | ICD-10-CM | POA: Diagnosis present

## 2021-12-27 DIAGNOSIS — S066X1S Traumatic subarachnoid hemorrhage with loss of consciousness of 30 minutes or less, sequela: Secondary | ICD-10-CM | POA: Diagnosis not present

## 2021-12-27 DIAGNOSIS — F068 Other specified mental disorders due to known physiological condition: Secondary | ICD-10-CM | POA: Diagnosis not present

## 2021-12-28 ENCOUNTER — Encounter: Payer: Self-pay | Admitting: *Deleted

## 2021-12-28 NOTE — Telephone Encounter (Signed)
Opened in error

## 2022-01-05 ENCOUNTER — Encounter: Payer: Self-pay | Admitting: Psychology

## 2022-01-05 NOTE — Progress Notes (Signed)
12/27/2021:  10 AM- 11 AM ? ?Today's visit was an in person visit was conducted in my outpatient clinic office with the patient myself present.  1 hour spent going over her current symptoms and continue to work on coping and management skills due to ongoing memory and cognitive difficulties. ? 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  The patient continues to struggle with his memory and cognitive functioning.  Patient describes stressors with stressors with family and coping with memory and cognitive issues. ? ? ? ? ? ? ? ? ? ? ? ? ? ? ? ? ? ? ? ? ? ?

## 2022-01-11 ENCOUNTER — Encounter: Payer: Self-pay | Admitting: Physical Medicine & Rehabilitation

## 2022-01-11 ENCOUNTER — Encounter
Payer: BC Managed Care – PPO | Attending: Physical Medicine & Rehabilitation | Admitting: Physical Medicine & Rehabilitation

## 2022-01-11 VITALS — BP 118/80 | HR 65 | Ht 72.0 in | Wt 216.0 lb

## 2022-01-11 DIAGNOSIS — H53461 Homonymous bilateral field defects, right side: Secondary | ICD-10-CM | POA: Diagnosis not present

## 2022-01-11 DIAGNOSIS — R519 Headache, unspecified: Secondary | ICD-10-CM | POA: Diagnosis not present

## 2022-01-11 DIAGNOSIS — S066X1S Traumatic subarachnoid hemorrhage with loss of consciousness of 30 minutes or less, sequela: Secondary | ICD-10-CM

## 2022-01-11 DIAGNOSIS — S069X0S Unspecified intracranial injury without loss of consciousness, sequela: Secondary | ICD-10-CM | POA: Insufficient documentation

## 2022-01-11 DIAGNOSIS — X58XXXS Exposure to other specified factors, sequela: Secondary | ICD-10-CM | POA: Diagnosis not present

## 2022-01-11 DIAGNOSIS — Z87891 Personal history of nicotine dependence: Secondary | ICD-10-CM | POA: Insufficient documentation

## 2022-01-11 DIAGNOSIS — Z87820 Personal history of traumatic brain injury: Secondary | ICD-10-CM | POA: Diagnosis not present

## 2022-01-11 DIAGNOSIS — S069XAS Unspecified intracranial injury with loss of consciousness status unknown, sequela: Secondary | ICD-10-CM | POA: Diagnosis not present

## 2022-01-11 DIAGNOSIS — F068 Other specified mental disorders due to known physiological condition: Secondary | ICD-10-CM | POA: Diagnosis not present

## 2022-01-11 DIAGNOSIS — Z79899 Other long term (current) drug therapy: Secondary | ICD-10-CM | POA: Insufficient documentation

## 2022-01-11 DIAGNOSIS — F0630 Mood disorder due to known physiological condition, unspecified: Secondary | ICD-10-CM

## 2022-01-11 NOTE — Progress Notes (Signed)
? ?Subjective:  ? ? Patient ID: Albert [name], male    DOB: 07-09-1976, 46 y.o.   MRN: [id number] ? ?HPI ? ?Albert [name] is here in follow up of his TBI. He doesn't report any many changes. Memory and concentration are stable with his meds. He uses notes and reminders ? 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 He uses notes and templates to stay on track with tables and orders.  ? ?Albert [name] does well with his family as well.  ? ?Headaches are under control for the most part except at the end of the day. Sometimes loud noises and stress can set them off.  ? ?Pain Inventory ?Average Pain 3 ?Pain Right Now 0 ?My pain is sharp ? ?LOCATION OF PAIN  head ? ?BOWEL ?Number of stools per week: 14 ? ? ?BLADDER ?Normal ? ? ? ?Mobility ?walk without assistance ?how many minutes can you walk? 60 ?ability to climb steps?  yes ?do you drive?  no ? 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 ?Neuro/Psych ?numbness ?tingling ? ?Prior Studies ?Any changes since last visit?  no ? ?Physicians involved in your care ?Any changes since last visit?  no ? ? ?Family History  ?Problem Relation Age of Onset  ? Heart disease Father   ? ?Social History  ? ?Socioeconomic History  ? Marital status: Married  ?  Spouse name: Not on file  ? Number of children: Not on file  ? Years of education: Not on file  ? Highest education level: Not on file  ?Occupational History  ? Not on file  ?Tobacco Use  ? Smoking status: Former  ?  Types: Cigarettes  ?  Quit date: 12/17/1999  ?  Years since quitting: 22.0  ? Smokeless tobacco: Never  ?Vaping Use  ? Vaping Use: Never used  ?Substance and Sexual Activity  ? Alcohol use: Yes  ? Drug use: No  ? Sexual activity: Not on file  ?Other Topics Concern  ? Not on file  ?Social History Narrative  ? Not on file  ? ?Social Determinants of Health  ? ?Financial Resource Strain: Not on file  ?Food Insecurity: Not on file  ?Transportation Needs: Not on file   ?Physical Activity: Not on file  ?Stress: Not on file  ?Social Connections: Not on file  ? ?Past Surgical History:  ?Procedure Laterality Date  ? CRANIOPLASTY N/A 06/10/2013  ? Procedure: CRANIOPLASTY;  Surgeon: Floyce Stakes, MD;  Location: [facility];  Service: Neurosurgery;  Laterality: N/A;  CRANIOPLASTY  ? CRANIOTOMY Left 12/15/2012  ? Procedure: CRANIECTOMY HEMATOMA EVACUATION SUBDURAL WITH PLACEMENT OF BONE FLAP IN ABDOMINAL WALL;  Surgeon: Floyce Stakes, MD;  Location: [facility];  Service: Neurosurgery;  Laterality: Left;  ? CRANIOTOMY Left 12/28/2012  ? Procedure: CRANIOTOMY HEMATOMA EVACUATION SUBDURAL;  Surgeon: Erline Levine, MD;  Location: [place] [facility];  Service: Neurosurgery;  Laterality: Left;  ? PEG PLACEMENT N/A 12/31/2012  ? Procedure: PERCUTANEOUS ENDOSCOPIC GASTROSTOMY (PEG) PLACEMENT;  Surgeon: Zenovia Jarred, MD;  Location: [place];  Service: General;  Laterality: N/A;  bedside  peg  ? PERCUTANEOUS TRACHEOSTOMY N/A 12/31/2012  ? Procedure: PERCUTANEOUS TRACHEOSTOMY (BEDSIDE);  Surgeon: Zenovia Jarred, MD;  Location: [place];  Service: General;  Laterality: N/A;  ? WRIST SURGERY Right 2008  ? ?Past Medical History:  ?Diagnosis Date  ? Headache(784.0)   ? Rotator cuff injury   ?  right  ? Seizures ([place])   ? 12/28/2012  ? Stroke [facility])   ? some aphasia , weakness in the R arm , Out pt. rehab currently   ? Swallowing problem   ? still relearning controlled swallowing - rec'ing speech therapy in rehab  ? Traumatic brain injury [facility])   ? ?BP 118/80   Pulse 65   Ht 6' (1.829 m)   Wt 216 lb (98 kg)   SpO2 97%   BMI 29.29 kg/m?  ? ?Opioid Risk Score:   ?Fall Risk Score:  `1 ? ?Depression screen PHQ 2/9 ? ? ?  01/11/2022  ?  8:43 AM 07/13/2021  ?  9:37 AM 01/05/2021  ? 10:03 AM 09/01/2020  ?  9:55 AM 01/07/2020  ?  9:22 AM 03/11/2019  ? 10:08 AM 05/17/2016  ?  9:52 AM  ?Depression screen PHQ 2/9  ?Decreased Interest 0 0 0 0 0 0 0  ?Down, Depressed, Hopeless 0 0 0 0 0 0 0  ?PHQ - 2 Score 0 0 0 0 0 0  0  ?  ? ?Review of Systems  ?Constitutional: Negative.   ?HENT: Negative.    ?Eyes: Negative.   ?Respiratory: Negative.    ?Cardiovascular: Negative.   ?Gastrointestinal: Negative.   ?Endocrine: Negative.   ?Genitourinary: Negative.   ?Musculoskeletal: Negative.   ?Skin: Negative.   ?Allergic/Immunologic: Negative.   ?Neurological:  Positive for numbness and headaches.  ?Hematological: Negative.   ?Psychiatric/Behavioral: Negative.    ? ?   ?Objective:  ? Physical Exam ? ?General: No acute distress ?HEENT: NCAT, EOMI, oral membranes moist ?Cards: reg rate  ?Chest: normal effort ?Abdomen: Soft, NT, ND ?Skin: dry, intact ?Extremities: no edema ?Psych: pleasant and appropriate  ?Neuro: focused attention. Improved insight and awareness. Better functional memory.  Right HH ongoing  ?Musculoskeletal:  normal gait.  ?  ?  ?  ?  ?Assessment:  ?1. 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 Status post craniotomy evacuation of hematoma. Persistent ongoing cognitive deficits as well as right visual field loss---compensates fairly nice ?2. Old right shoulder injury, likely RTC tendonitis- generally resolved  ?3. Seizure post- traumatic  ?4. Scrotal swelling, pain----resolved ?5. Post-traumatic headaches---resolved  ?   ?  ?Plan:  ?1. Maintain dose propranolol for headache and mood---this has been effective ?2. Can stop exelon  4.'5mg'$  BID and see if it has any adverse affects on memory.  ?3. Concerta for attention and memory.   second rx for next mos ?              We will continue the controlled substance monitoring program, this consists of regular clinic visits, examinations, routine drug screening, pill counts as well as use of [place] Controlled Substance Reporting System. [place] today.   ?            Medication was refilled and a second prescription was sent to the patient's pharmacy for next month.    ?             -may call for RF in about 2 months ?4. Continue  keppra '500mg'$  bid ?5. sleep hygiene improved ?6. 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  ?  ?  ?15 minutes of face to face patient care time were spent during this visit. All questions were encouraged and answered.  Follow up with me in 6 mos .  ? ? ? ?    ? ?

## 2022-01-11 NOTE — Patient Instructions (Addendum)
PLEASE FEEL FREE TO CALL OUR OFFICE WITH ANY PROBLEMS OR QUESTIONS ([phone]) ? ? ?MAY STOP EXELON AND SEE IF THERE IS ANY ADVERSE EFFECT ON YOUR MEMORY. IF YOU NOTICE WORSENING, JUST RESUME IT ? ?                ?

## 2022-01-28 ENCOUNTER — Encounter: Payer: Self-pay | Admitting: Physical Medicine & Rehabilitation

## 2022-01-28 DIAGNOSIS — S066X1S Traumatic subarachnoid hemorrhage with loss of consciousness of 30 minutes or less, sequela: Secondary | ICD-10-CM

## 2022-01-30 MED ORDER — METHYLPHENIDATE HCL ER (OSM) 36 MG PO TBCR: 36.0000 mg | EXTENDED_RELEASE_TABLET | Freq: Every day | ORAL | 0 refills | Status: DC

## 2022-01-30 NOTE — Telephone Encounter (Signed)
Concerta refilled.  

## 2022-01-31 ENCOUNTER — Encounter: Payer: Self-pay | Admitting: Psychology

## 2022-01-31 ENCOUNTER — Encounter (HOSPITAL_BASED_OUTPATIENT_CLINIC_OR_DEPARTMENT_OTHER): Payer: BC Managed Care – PPO | Admitting: Psychology

## 2022-01-31 DIAGNOSIS — S066X1S Traumatic subarachnoid hemorrhage with loss of consciousness of 30 minutes or less, sequela: Secondary | ICD-10-CM

## 2022-01-31 DIAGNOSIS — S069X0S Unspecified intracranial injury without loss of consciousness, sequela: Secondary | ICD-10-CM

## 2022-01-31 DIAGNOSIS — F0630 Mood disorder due to known physiological condition, unspecified: Secondary | ICD-10-CM | POA: Diagnosis not present

## 2022-01-31 DIAGNOSIS — F068 Other specified mental disorders due to known physiological condition: Secondary | ICD-10-CM

## 2022-01-31 DIAGNOSIS — Z79899 Other long term (current) drug therapy: Secondary | ICD-10-CM | POA: Diagnosis not present

## 2022-01-31 DIAGNOSIS — Z87891 Personal history of nicotine dependence: Secondary | ICD-10-CM | POA: Diagnosis not present

## 2022-01-31 DIAGNOSIS — H53461 Homonymous bilateral field defects, right side: Secondary | ICD-10-CM | POA: Diagnosis not present

## 2022-01-31 DIAGNOSIS — S069XAS Unspecified intracranial injury with loss of consciousness status unknown, sequela: Secondary | ICD-10-CM | POA: Diagnosis not present

## 2022-01-31 DIAGNOSIS — R419 Unspecified symptoms and signs involving cognitive functions and awareness: Secondary | ICD-10-CM

## 2022-01-31 DIAGNOSIS — R519 Headache, unspecified: Secondary | ICD-10-CM | POA: Diagnosis not present

## 2022-01-31 NOTE — Progress Notes (Signed)
01/31/2022 10 AM-11 AM:    Today's visit was an in person visit conducted in my outpatient clinic office with the patient myself present.  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  The patient has continued to struggle with memory and cognitive functioning but has made great progress over time.  The patient has continued to work as a Doctor, general practice and done so successfully but has not been able to return to his previous occupation which demanded more sustained attention and memory functions than he is able to manage.  The patient continues to get fulfillment and encouragement from his job.  There continue to be psychosocial stressors going on at home but he again is managing and coping better with these issues as well.  Today we continue to work on therapeutic interventions around his residual deficits.

## 2022-02-13 ENCOUNTER — Other Ambulatory Visit: Payer: Self-pay | Admitting: Physical Medicine & Rehabilitation

## 2022-02-13 DIAGNOSIS — S066X1S Traumatic subarachnoid hemorrhage with loss of consciousness of 30 minutes or less, sequela: Secondary | ICD-10-CM

## 2022-02-13 MED ORDER — METHYLPHENIDATE HCL ER (OSM) 36 MG PO TBCR: 36.0000 mg | EXTENDED_RELEASE_TABLET | Freq: Every day | ORAL | 0 refills | Status: DC

## 2022-02-17 DIAGNOSIS — R1032 Left lower quadrant pain: Secondary | ICD-10-CM | POA: Diagnosis not present

## 2022-02-20 ENCOUNTER — Other Ambulatory Visit: Payer: Self-pay | Admitting: Physical Medicine & Rehabilitation

## 2022-02-20 DIAGNOSIS — S066X1S Traumatic subarachnoid hemorrhage with loss of consciousness of 30 minutes or less, sequela: Secondary | ICD-10-CM

## 2022-02-20 NOTE — Telephone Encounter (Signed)
Patient called clinic and stated [place] is going [place] this [place] morning and needs a refill of Methylphenidate. [facility] [address]. Per PMP, last fill date was 01/30/22

## 2022-02-21 ENCOUNTER — Other Ambulatory Visit: Payer: Self-pay | Admitting: Physical Medicine & Rehabilitation

## 2022-02-21 DIAGNOSIS — S066X1S Traumatic subarachnoid hemorrhage with loss of consciousness of 30 minutes or less, sequela: Secondary | ICD-10-CM

## 2022-02-21 MED ORDER — METHYLPHENIDATE HCL ER (OSM) 36 MG PO TBCR: 36.0000 mg | EXTENDED_RELEASE_TABLET | Freq: Every day | ORAL | 0 refills | Status: DC

## 2022-02-21 NOTE — Telephone Encounter (Signed)
I'm assuming [facility]? Notes are confusing. Rx sent in.

## 2022-02-22 ENCOUNTER — Telehealth: Payer: Self-pay | Admitting: *Deleted

## 2022-02-22 NOTE — Telephone Encounter (Signed)
Ben from [facility] is asking for a call back about the Rx for Mr Hamilton.

## 2022-02-22 NOTE — Telephone Encounter (Signed)
Called and spoke to [place]

## 2022-03-01 ENCOUNTER — Ambulatory Visit: Payer: BC Managed Care – PPO | Admitting: Psychology

## 2022-03-06 ENCOUNTER — Other Ambulatory Visit: Payer: Self-pay | Admitting: Physical Medicine & Rehabilitation

## 2022-03-06 DIAGNOSIS — S066X1S Traumatic subarachnoid hemorrhage with loss of consciousness of 30 minutes or less, sequela: Secondary | ICD-10-CM

## 2022-03-06 DIAGNOSIS — S069XAS Unspecified intracranial injury with loss of consciousness status unknown, sequela: Secondary | ICD-10-CM

## 2022-03-28 ENCOUNTER — Encounter: Payer: BC Managed Care – PPO | Attending: Psychology | Admitting: Psychology

## 2022-03-28 ENCOUNTER — Encounter: Payer: Self-pay | Admitting: Psychology

## 2022-03-28 DIAGNOSIS — Z8679 Personal history of other diseases of the circulatory system: Secondary | ICD-10-CM | POA: Diagnosis not present

## 2022-03-28 DIAGNOSIS — X58XXXA Exposure to other specified factors, initial encounter: Secondary | ICD-10-CM | POA: Diagnosis not present

## 2022-03-28 DIAGNOSIS — S066X1A Traumatic subarachnoid hemorrhage with loss of consciousness of 30 minutes or less, initial encounter: Secondary | ICD-10-CM | POA: Diagnosis not present

## 2022-03-28 DIAGNOSIS — S069X0S Unspecified intracranial injury without loss of consciousness, sequela: Secondary | ICD-10-CM | POA: Diagnosis not present

## 2022-03-28 DIAGNOSIS — I69015 Cognitive social or emotional deficit following nontraumatic subarachnoid hemorrhage: Secondary | ICD-10-CM

## 2022-03-28 DIAGNOSIS — S066X1S Traumatic subarachnoid hemorrhage with loss of consciousness of 30 minutes or less, sequela: Secondary | ICD-10-CM | POA: Diagnosis not present

## 2022-03-28 DIAGNOSIS — F0630 Mood disorder due to known physiological condition, unspecified: Secondary | ICD-10-CM | POA: Insufficient documentation

## 2022-03-28 DIAGNOSIS — S069X0A Unspecified intracranial injury without loss of consciousness, initial encounter: Secondary | ICD-10-CM | POA: Insufficient documentation

## 2022-03-28 DIAGNOSIS — F068 Other specified mental disorders due to known physiological condition: Secondary | ICD-10-CM | POA: Diagnosis not present

## 2022-03-28 DIAGNOSIS — S069XAS Unspecified intracranial injury with loss of consciousness status unknown, sequela: Secondary | ICD-10-CM | POA: Insufficient documentation

## 2022-03-28 NOTE — Progress Notes (Signed)
03/28/2022 11 AM-12 PM:    Today's visit was an in person visit conducted in my outpatient clinic office with the patient myself present.  We have continue to work on better coping managements and strategies to deal with the residual effects of his cerebrovascular event and traumatic brain injury.  The patient reports that his work has been going fairly well but he continues to deal with coping regarding his occupational loss and inability to do things like he had done in the past.  1 particular issue had to do with his ability to help his son who is now playing baseball which the patient had a prior history of doing quite well.  He does have some frustrations about his inability to do the things that he had done before or would hope to of been able to do now.

## 2022-03-29 ENCOUNTER — Ambulatory Visit: Payer: BC Managed Care – PPO | Admitting: Psychology

## 2022-04-03 ENCOUNTER — Other Ambulatory Visit: Payer: Self-pay | Admitting: Physical Medicine & Rehabilitation

## 2022-04-03 DIAGNOSIS — S066X1S Traumatic subarachnoid hemorrhage with loss of consciousness of 30 minutes or less, sequela: Secondary | ICD-10-CM

## 2022-04-04 ENCOUNTER — Other Ambulatory Visit: Payer: Self-pay | Admitting: Physical Medicine & Rehabilitation

## 2022-04-04 DIAGNOSIS — F411 Generalized anxiety disorder: Secondary | ICD-10-CM | POA: Diagnosis not present

## 2022-04-04 DIAGNOSIS — E782 Mixed hyperlipidemia: Secondary | ICD-10-CM | POA: Diagnosis not present

## 2022-04-04 DIAGNOSIS — F331 Major depressive disorder, recurrent, moderate: Secondary | ICD-10-CM | POA: Diagnosis not present

## 2022-04-04 DIAGNOSIS — G4700 Insomnia, unspecified: Secondary | ICD-10-CM | POA: Diagnosis not present

## 2022-04-04 DIAGNOSIS — S066X1S Traumatic subarachnoid hemorrhage with loss of consciousness of 30 minutes or less, sequela: Secondary | ICD-10-CM

## 2022-04-04 DIAGNOSIS — I10 Essential (primary) hypertension: Secondary | ICD-10-CM | POA: Diagnosis not present

## 2022-04-04 MED ORDER — METHYLPHENIDATE HCL ER (OSM) 36 MG PO TBCR: 36.0000 mg | EXTENDED_RELEASE_TABLET | Freq: Every day | ORAL | 0 refills | Status: DC

## 2022-04-04 NOTE — Telephone Encounter (Signed)
Patient called for a refill on Methylphenidate. Per PMP, last fill was 02/22/22

## 2022-04-10 ENCOUNTER — Ambulatory Visit: Payer: BC Managed Care – PPO | Admitting: Psychology

## 2022-04-25 DIAGNOSIS — G4700 Insomnia, unspecified: Secondary | ICD-10-CM | POA: Diagnosis not present

## 2022-04-25 DIAGNOSIS — F411 Generalized anxiety disorder: Secondary | ICD-10-CM | POA: Diagnosis not present

## 2022-04-25 DIAGNOSIS — Z23 Encounter for immunization: Secondary | ICD-10-CM | POA: Diagnosis not present

## 2022-04-25 DIAGNOSIS — F331 Major depressive disorder, recurrent, moderate: Secondary | ICD-10-CM | POA: Diagnosis not present

## 2022-05-01 ENCOUNTER — Other Ambulatory Visit: Payer: Self-pay | Admitting: Physical Medicine & Rehabilitation

## 2022-05-01 DIAGNOSIS — S066X1S Traumatic subarachnoid hemorrhage with loss of consciousness of 30 minutes or less, sequela: Secondary | ICD-10-CM

## 2022-05-01 DIAGNOSIS — R1031 Right lower quadrant pain: Secondary | ICD-10-CM | POA: Diagnosis not present

## 2022-05-02 ENCOUNTER — Encounter: Payer: Self-pay | Admitting: Psychology

## 2022-05-02 ENCOUNTER — Encounter: Payer: BC Managed Care – PPO | Attending: Physical Medicine & Rehabilitation | Admitting: Psychology

## 2022-05-02 DIAGNOSIS — S069X0S Unspecified intracranial injury without loss of consciousness, sequela: Secondary | ICD-10-CM | POA: Diagnosis not present

## 2022-05-02 DIAGNOSIS — S069XAS Unspecified intracranial injury with loss of consciousness status unknown, sequela: Secondary | ICD-10-CM | POA: Insufficient documentation

## 2022-05-02 DIAGNOSIS — S066X1S Traumatic subarachnoid hemorrhage with loss of consciousness of 30 minutes or less, sequela: Secondary | ICD-10-CM

## 2022-05-02 DIAGNOSIS — F0630 Mood disorder due to known physiological condition, unspecified: Secondary | ICD-10-CM | POA: Insufficient documentation

## 2022-05-02 DIAGNOSIS — X58XXXA Exposure to other specified factors, initial encounter: Secondary | ICD-10-CM | POA: Diagnosis not present

## 2022-05-02 DIAGNOSIS — S069X0A Unspecified intracranial injury without loss of consciousness, initial encounter: Secondary | ICD-10-CM | POA: Diagnosis not present

## 2022-05-02 DIAGNOSIS — F068 Other specified mental disorders due to known physiological condition: Secondary | ICD-10-CM | POA: Insufficient documentation

## 2022-05-02 DIAGNOSIS — I69015 Cognitive social or emotional deficit following nontraumatic subarachnoid hemorrhage: Secondary | ICD-10-CM | POA: Diagnosis not present

## 2022-05-02 DIAGNOSIS — S069XAA Unspecified intracranial injury with loss of consciousness status unknown, initial encounter: Secondary | ICD-10-CM | POA: Insufficient documentation

## 2022-05-02 DIAGNOSIS — S066X1A Traumatic subarachnoid hemorrhage with loss of consciousness of 30 minutes or less, initial encounter: Secondary | ICD-10-CM | POA: Diagnosis not present

## 2022-05-02 DIAGNOSIS — R4189 Other symptoms and signs involving cognitive functions and awareness: Secondary | ICD-10-CM

## 2022-05-02 MED ORDER — METHYLPHENIDATE HCL ER (OSM) 36 MG PO TBCR: 36.0000 mg | EXTENDED_RELEASE_TABLET | Freq: Every day | ORAL | 0 refills | Status: DC

## 2022-05-02 NOTE — Progress Notes (Signed)
05/02/2022  The patient returns today for follow-up therapeutic interventions in the outpatient clinic.  It was an in person visit that was conducted in my outpatient clinic with the patient myself present.  The patient reports that he continues to have his cognitive difficulties associated with memory etc. although he has made significant improvements in recovery.  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  However, he has not been able to return to his prior occupation due to cognitive changes post cerebrovascular injury.  The patient is continuing to work on strategies and skills dealing with various psychosocial stressors particularly behaviors of his kids with the patient having significant responsibility for care of his children.  We continue to work on coping skills and strategies around these issues.  Patient denies any significant depressive symptoms but stress continues to be problematic with coping issues around his cerebrovascular accident.

## 2022-05-30 ENCOUNTER — Encounter: Payer: BC Managed Care – PPO | Attending: Physical Medicine & Rehabilitation | Admitting: Psychology

## 2022-05-30 ENCOUNTER — Encounter: Payer: Self-pay | Admitting: Psychology

## 2022-05-30 DIAGNOSIS — X58XXXA Exposure to other specified factors, initial encounter: Secondary | ICD-10-CM | POA: Diagnosis not present

## 2022-05-30 DIAGNOSIS — S069XAA Unspecified intracranial injury with loss of consciousness status unknown, initial encounter: Secondary | ICD-10-CM | POA: Insufficient documentation

## 2022-05-30 DIAGNOSIS — S069X0A Unspecified intracranial injury without loss of consciousness, initial encounter: Secondary | ICD-10-CM | POA: Diagnosis not present

## 2022-05-30 DIAGNOSIS — S066X1A Traumatic subarachnoid hemorrhage with loss of consciousness of 30 minutes or less, initial encounter: Secondary | ICD-10-CM | POA: Diagnosis not present

## 2022-05-30 DIAGNOSIS — R41844 Frontal lobe and executive function deficit: Secondary | ICD-10-CM | POA: Diagnosis not present

## 2022-05-30 DIAGNOSIS — S066X1S Traumatic subarachnoid hemorrhage with loss of consciousness of 30 minutes or less, sequela: Secondary | ICD-10-CM | POA: Diagnosis not present

## 2022-05-30 DIAGNOSIS — R412 Retrograde amnesia: Secondary | ICD-10-CM

## 2022-05-30 DIAGNOSIS — F068 Other specified mental disorders due to known physiological condition: Secondary | ICD-10-CM | POA: Diagnosis not present

## 2022-05-30 DIAGNOSIS — S069XAS Unspecified intracranial injury with loss of consciousness status unknown, sequela: Secondary | ICD-10-CM | POA: Insufficient documentation

## 2022-05-30 DIAGNOSIS — S069X0S Unspecified intracranial injury without loss of consciousness, sequela: Secondary | ICD-10-CM | POA: Diagnosis not present

## 2022-05-30 DIAGNOSIS — R4589 Other symptoms and signs involving emotional state: Secondary | ICD-10-CM | POA: Diagnosis not present

## 2022-05-30 DIAGNOSIS — F0630 Mood disorder due to known physiological condition, unspecified: Secondary | ICD-10-CM | POA: Insufficient documentation

## 2022-05-30 NOTE — Progress Notes (Signed)
05/30/2022 9 AM-10 AM:  Today's visit was an in person visit that was conducted in my outpatient clinic office.  The patient and myself were present for this appointment.  We have continue to work on therapeutic interventions around building coping and adjustment strategies with resulting residual cognitive deficits and memory deficits following his traumatic brain injury.  The patient reports that he has continued to successfully do the job that he has been doing but has not been able to return back to his previous employment and job expectations.  Memory and decision-making continue to be impacted.  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  He is working on reconnecting with the person on the inpatient unit about trying to start this back.  Patient continues to work on strategies and skills around coping better with psychosocial stressors and having the patient's and adaptive strategy to cope with adjusting and maturing children.

## 2022-06-01 ENCOUNTER — Telehealth: Payer: Self-pay | Admitting: *Deleted

## 2022-06-01 DIAGNOSIS — S066X1S Traumatic subarachnoid hemorrhage with loss of consciousness of 30 minutes or less, sequela: Secondary | ICD-10-CM

## 2022-06-01 NOTE — Telephone Encounter (Signed)
Albert [name] is calling for a refill on his methylphenidate 36 mg (concerta). He is going out of town and would [facility]

## 2022-06-02 MED ORDER — METHYLPHENIDATE HCL ER (OSM) 36 MG PO TBCR: 36.0000 mg | EXTENDED_RELEASE_TABLET | Freq: Every day | ORAL | 0 refills | Status: DC

## 2022-06-02 MED ORDER — METHYLPHENIDATE HCL ER (OSM) 36 MG PO TBCR
36.0000 mg | EXTENDED_RELEASE_TABLET | Freq: Every day | ORAL | 0 refills | Status: DC
Start: 2022-06-02 — End: 2022-07-31

## 2022-06-02 NOTE — Telephone Encounter (Signed)
Rx written and sent to the pharmacy. Thanks!  

## 2022-06-08 ENCOUNTER — Encounter: Payer: Self-pay | Admitting: Physical Medicine & Rehabilitation

## 2022-06-08 DIAGNOSIS — S066X1S Traumatic subarachnoid hemorrhage with loss of consciousness of 30 minutes or less, sequela: Secondary | ICD-10-CM

## 2022-06-08 DIAGNOSIS — F0630 Mood disorder due to known physiological condition, unspecified: Secondary | ICD-10-CM

## 2022-06-08 MED ORDER — LEVETIRACETAM 500 MG PO TABS: 500.0000 mg | ORAL_TABLET | Freq: Two times a day (BID) | ORAL | 2 refills | Status: DC

## 2022-06-08 NOTE — Addendum Note (Signed)
Addended by: Caro Hight on: 06/08/2022 02:18 PM   Modules accepted: Orders

## 2022-07-04 ENCOUNTER — Encounter: Payer: BC Managed Care – PPO | Attending: Physical Medicine & Rehabilitation | Admitting: Psychology

## 2022-07-04 DIAGNOSIS — S069XAS Unspecified intracranial injury with loss of consciousness status unknown, sequela: Secondary | ICD-10-CM | POA: Insufficient documentation

## 2022-07-04 DIAGNOSIS — S069X0S Unspecified intracranial injury without loss of consciousness, sequela: Secondary | ICD-10-CM | POA: Insufficient documentation

## 2022-07-04 DIAGNOSIS — F068 Other specified mental disorders due to known physiological condition: Secondary | ICD-10-CM | POA: Insufficient documentation

## 2022-07-04 DIAGNOSIS — F0630 Mood disorder due to known physiological condition, unspecified: Secondary | ICD-10-CM | POA: Diagnosis not present

## 2022-07-04 DIAGNOSIS — H53461 Homonymous bilateral field defects, right side: Secondary | ICD-10-CM | POA: Insufficient documentation

## 2022-07-04 DIAGNOSIS — S066X1S Traumatic subarachnoid hemorrhage with loss of consciousness of 30 minutes or less, sequela: Secondary | ICD-10-CM | POA: Insufficient documentation

## 2022-07-05 ENCOUNTER — Encounter: Payer: Self-pay | Admitting: Psychology

## 2022-07-05 NOTE — Progress Notes (Signed)
06/30/2022 9 AM-10 AM:  Today's visit was an in person visit that was conducted in outpatient clinic office.  The patient myself were present for this visit.  The patient continues to have limitations to his cognitive functioning and motor functioning.  He has been working on trying to help his son with baseball as the patient himself had played baseball when he was younger.  The patient reports that there are some stressors trying to work with his son in this capacity as he cannot do all of the things physically that he used to do.  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  The patient reports that he enjoys this job but he knows that he is pushing himself as far as his memory and capacity and is unable to return to the type of work he did prior to his cerebrovascular accident.  Patient reports that his mood has been better.

## 2022-07-19 ENCOUNTER — Encounter
Payer: BC Managed Care – PPO | Attending: Physical Medicine & Rehabilitation | Admitting: Physical Medicine & Rehabilitation

## 2022-07-19 ENCOUNTER — Encounter: Payer: Self-pay | Admitting: Physical Medicine & Rehabilitation

## 2022-07-19 VITALS — BP 125/78 | HR 66 | Ht 72.0 in | Wt 211.0 lb

## 2022-07-19 DIAGNOSIS — F0630 Mood disorder due to known physiological condition, unspecified: Secondary | ICD-10-CM | POA: Insufficient documentation

## 2022-07-19 DIAGNOSIS — S065X9A Traumatic subdural hemorrhage with loss of consciousness of unspecified duration, initial encounter: Secondary | ICD-10-CM

## 2022-07-19 DIAGNOSIS — S065X9D Traumatic subdural hemorrhage with loss of consciousness of unspecified duration, subsequent encounter: Secondary | ICD-10-CM

## 2022-07-19 DIAGNOSIS — R41841 Cognitive communication deficit: Secondary | ICD-10-CM

## 2022-07-19 DIAGNOSIS — S066X1S Traumatic subarachnoid hemorrhage with loss of consciousness of 30 minutes or less, sequela: Secondary | ICD-10-CM | POA: Diagnosis not present

## 2022-07-19 DIAGNOSIS — H53461 Homonymous bilateral field defects, right side: Secondary | ICD-10-CM | POA: Diagnosis not present

## 2022-07-19 DIAGNOSIS — S066X9D Traumatic subarachnoid hemorrhage with loss of consciousness of unspecified duration, subsequent encounter: Secondary | ICD-10-CM | POA: Diagnosis not present

## 2022-07-19 DIAGNOSIS — S069X0S Unspecified intracranial injury without loss of consciousness, sequela: Secondary | ICD-10-CM | POA: Diagnosis not present

## 2022-07-19 DIAGNOSIS — H53451 Other localized visual field defect, right eye: Secondary | ICD-10-CM

## 2022-07-19 DIAGNOSIS — S065X9S Traumatic subdural hemorrhage with loss of consciousness of unspecified duration, sequela: Secondary | ICD-10-CM | POA: Insufficient documentation

## 2022-07-19 DIAGNOSIS — S066X9A Traumatic subarachnoid hemorrhage with loss of consciousness of unspecified duration, initial encounter: Secondary | ICD-10-CM | POA: Diagnosis not present

## 2022-07-19 DIAGNOSIS — R4189 Other symptoms and signs involving cognitive functions and awareness: Secondary | ICD-10-CM | POA: Insufficient documentation

## 2022-07-19 DIAGNOSIS — S069XAS Unspecified intracranial injury with loss of consciousness status unknown, sequela: Secondary | ICD-10-CM | POA: Diagnosis not present

## 2022-07-19 DIAGNOSIS — F068 Other specified mental disorders due to known physiological condition: Secondary | ICD-10-CM | POA: Diagnosis not present

## 2022-07-19 DIAGNOSIS — R561 Post traumatic seizures: Secondary | ICD-10-CM

## 2022-07-19 MED ORDER — METHYLPHENIDATE HCL ER (OSM) 36 MG PO TBCR: 36.0000 mg | EXTENDED_RELEASE_TABLET | Freq: Every day | ORAL | 0 refills | Status: DC

## 2022-07-19 NOTE — Patient Instructions (Signed)
ALWAYS FEEL FREE TO CALL OUR OFFICE WITH ANY PROBLEMS OR QUESTIONS ([phone])  **PLEASE NOTE** ALL MEDICATION REFILL REQUESTS (INCLUDING CONTROLLED SUBSTANCES) NEED TO BE MADE AT LEAST 7 DAYS PRIOR TO REFILL BEING DUE. ANY REFILL REQUESTS INSIDE THAT TIME FRAME MAY RESULT IN DELAYS IN RECEIVING YOUR PRESCRIPTION.                    

## 2022-07-19 NOTE — Progress Notes (Signed)
Subjective:    Patient ID: Albert [name], male    DOB: August 29, 1976, 46 y.o.   MRN: [id number]  HPI  Albert [name] is here in follow up of his TBI and cognitive deficits. Things have been going pretty well. He is at baseline from a cognitive standpoint. He seems to be doing well from an interpersonal standpoint.   He remains on concerta for concentration and memory. This continues to work well for him. He is on exelon as well.   He is active with his kids. He stays active physically as ewll. He has also done some work part time.      Pain Inventory Average Pain  8 random headache Pain Right Now 0 My pain is intermittent and sharp  LOCATION OF PAIN  headache  BOWEL Number of stools per week: 10  BLADDER Normal   Mobility ability to climb steps?  yes do you drive?  no Do you have any goals in this area?  yes  Function disabled: date disabled 2014 Do you have any goals in this area?  yes  Neuro/Psych numbness tingling  Prior Studies Any changes since last visit?  no  Physicians involved in your care Any changes since last visit?  no   Family History  Problem Relation Age of Onset   Heart disease Father    Social History   Socioeconomic History   Marital status: Married    Spouse name: Not on file   Number of children: Not on file   Years of education: Not on file   Highest education level: Not on file  Occupational History   Not on file  Tobacco Use   Smoking status: Former    Types: Cigarettes    Quit date: 12/17/1999    Years since quitting: 22.6   Smokeless tobacco: Never  Vaping Use   Vaping Use: Never used  Substance and Sexual Activity   Alcohol use: Yes   Drug use: No   Sexual activity: Not on file  Other Topics Concern   Not on file  Social History Narrative   Not on file   Social Determinants of Health   Financial Resource Strain: Not on file  Food Insecurity: Not on file  Transportation Needs: Not on file  Physical Activity: Not on  file  Stress: Not on file  Social Connections: Not on file   Past Surgical History:  Procedure Laterality Date   CRANIOPLASTY N/A 06/10/2013   Procedure: CRANIOPLASTY;  Surgeon: Floyce Stakes, MD;  Location: [facility];  Service: Neurosurgery;  Laterality: N/A;  CRANIOPLASTY   CRANIOTOMY Left 12/15/2012   Procedure: CRANIECTOMY HEMATOMA EVACUATION SUBDURAL WITH PLACEMENT OF BONE FLAP IN ABDOMINAL WALL;  Surgeon: Floyce Stakes, MD;  Location: [place] [facility];  Service: Neurosurgery;  Laterality: Left;   CRANIOTOMY Left 12/28/2012   Procedure: CRANIOTOMY HEMATOMA EVACUATION SUBDURAL;  Surgeon: Erline Levine, MD;  Location: [place] [facility];  Service: Neurosurgery;  Laterality: Left;   PEG PLACEMENT N/A 12/31/2012   Procedure: PERCUTANEOUS ENDOSCOPIC GASTROSTOMY (PEG) PLACEMENT;  Surgeon: Zenovia Jarred, MD;  Location: [place];  Service: General;  Laterality: N/A;  bedside  peg   PERCUTANEOUS TRACHEOSTOMY N/A 12/31/2012   Procedure: PERCUTANEOUS TRACHEOSTOMY (BEDSIDE);  Surgeon: Zenovia Jarred, MD;  Location: [place];  Service: General;  Laterality: N/A;   WRIST SURGERY Right 2008   Past Medical History:  Diagnosis Date   Headache(784.0)    Rotator cuff injury    right   Seizures (  [place])    12/28/2012   Stroke ([place])    some aphasia , weakness in the R arm , Out pt. rehab currently    Swallowing problem    still relearning controlled swallowing - rec'ing speech therapy in rehab   Traumatic brain injury ([place])    Ht 6' (1.829 m)   Wt 211 lb (95.7 kg)   BMI 28.62 kg/m   Opioid Risk Score:   Fall Risk Score:  `1  Depression screen [facility] 2/9     07/19/2022    8:35 AM 01/11/2022    8:43 AM 07/13/2021    9:37 AM 01/05/2021   10:03 AM 09/01/2020    9:55 AM 01/07/2020    9:22 AM 03/11/2019   10:08 AM  Depression screen PHQ 2/9  Decreased Interest 0 0 0 0 0 0 0  Down, Depressed, Hopeless 0 0 0 0 0 0 0  PHQ - 2 Score 0 0 0 0 0 0 0    Review of Systems  Neurological:  Positive for  numbness and headaches.       Tingling       Objective:   Physical Exam General: No acute distress HEENT: NCAT, EOMI, oral membranes moist Cards: reg rate  Chest: normal effort Abdomen: Soft, NT, ND Skin: dry, intact Extremities: no edema Psych: pleasant and appropriate  Neuro: focused attention. functional insight and awareness. Better functional memory.  Right HH unchanged.  Musculoskeletal:  normal gait.          Assessment:  1. 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 Status post craniotomy evacuation of hematoma. Persistent ongoing cognitive deficits as well as right visual field loss---compensates fairly nice 2. Old right shoulder injury, likely RTC tendonitis- generally resolved  3. Seizure post- traumatic  4. Scrotal swelling, pain----resolved 5. Post-traumatic headaches---resolved       Plan:  1. Maintain dose propranolol for headache and mood---this has been effective 2. Can stop exelon  4.'5mg'$  BID and see if it has any adverse affects on memory.  3. Concerta for attention and memory.   second rx for next mos             We will continue the controlled substance monitoring program, this consists of regular clinic visits, examinations, routine drug screening, pill counts as well as use of [place] Controlled Substance Reporting System. [place] was [place] today.  .              Medication was refilled and a second prescription was not needed               -may call for RF in about 2 months 4. Continue keppra '500mg'$  bid 5. sleep hygiene improved 6. 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      15 minutes of face to face patient care time were spent during this visit. All questions were encouraged and answered.  Follow up with me in 6 mos .        v

## 2022-07-25 DIAGNOSIS — F411 Generalized anxiety disorder: Secondary | ICD-10-CM | POA: Diagnosis not present

## 2022-07-25 DIAGNOSIS — I10 Essential (primary) hypertension: Secondary | ICD-10-CM | POA: Diagnosis not present

## 2022-07-25 DIAGNOSIS — F331 Major depressive disorder, recurrent, moderate: Secondary | ICD-10-CM | POA: Diagnosis not present

## 2022-07-25 DIAGNOSIS — G4700 Insomnia, unspecified: Secondary | ICD-10-CM | POA: Diagnosis not present

## 2022-07-31 ENCOUNTER — Other Ambulatory Visit (HOSPITAL_BASED_OUTPATIENT_CLINIC_OR_DEPARTMENT_OTHER): Payer: Self-pay

## 2022-07-31 ENCOUNTER — Other Ambulatory Visit: Payer: Self-pay | Admitting: Physical Medicine & Rehabilitation

## 2022-07-31 DIAGNOSIS — S066X1S Traumatic subarachnoid hemorrhage with loss of consciousness of 30 minutes or less, sequela: Secondary | ICD-10-CM

## 2022-08-01 ENCOUNTER — Other Ambulatory Visit: Payer: Self-pay

## 2022-08-01 DIAGNOSIS — Z23 Encounter for immunization: Secondary | ICD-10-CM | POA: Diagnosis not present

## 2022-08-01 MED ORDER — METHYLPHENIDATE HCL ER (OSM) 36 MG PO TBCR
36.0000 mg | EXTENDED_RELEASE_TABLET | Freq: Every day | ORAL | 0 refills | Status: DC
  Filled 2022-08-01: qty 30, 30d supply, fill #0

## 2022-08-01 MED ORDER — METHYLPHENIDATE HCL ER (OSM) 36 MG PO TBCR: 36.0000 mg | EXTENDED_RELEASE_TABLET | Freq: Every day | ORAL | 0 refills | Status: DC

## 2022-08-08 ENCOUNTER — Encounter (HOSPITAL_BASED_OUTPATIENT_CLINIC_OR_DEPARTMENT_OTHER): Payer: BC Managed Care – PPO | Admitting: Psychology

## 2022-08-08 DIAGNOSIS — S065X9S Traumatic subdural hemorrhage with loss of consciousness of unspecified duration, sequela: Secondary | ICD-10-CM | POA: Diagnosis not present

## 2022-08-08 DIAGNOSIS — S066X1S Traumatic subarachnoid hemorrhage with loss of consciousness of 30 minutes or less, sequela: Secondary | ICD-10-CM

## 2022-08-08 DIAGNOSIS — S069XAS Unspecified intracranial injury with loss of consciousness status unknown, sequela: Secondary | ICD-10-CM

## 2022-08-08 DIAGNOSIS — H53461 Homonymous bilateral field defects, right side: Secondary | ICD-10-CM | POA: Diagnosis not present

## 2022-08-08 DIAGNOSIS — S065X9D Traumatic subdural hemorrhage with loss of consciousness of unspecified duration, subsequent encounter: Secondary | ICD-10-CM | POA: Diagnosis not present

## 2022-08-08 DIAGNOSIS — R4189 Other symptoms and signs involving cognitive functions and awareness: Secondary | ICD-10-CM | POA: Diagnosis not present

## 2022-08-08 DIAGNOSIS — F068 Other specified mental disorders due to known physiological condition: Secondary | ICD-10-CM | POA: Diagnosis not present

## 2022-08-08 DIAGNOSIS — S069X0S Unspecified intracranial injury without loss of consciousness, sequela: Secondary | ICD-10-CM | POA: Diagnosis not present

## 2022-08-08 DIAGNOSIS — F0630 Mood disorder due to known physiological condition, unspecified: Secondary | ICD-10-CM | POA: Diagnosis not present

## 2022-08-08 NOTE — Progress Notes (Signed)
08/08/2022 9 AM-10 AM:  Today's visit was an in person visit conducted in my outpatient clinic office.  The patient myself were both present for this visit.  The patient reports that he was able to get through the Thanksgiving holiday effectively and his family has been decorating his house and getting ready for Christmas which is a big time of the year for both his immediate family and extended family.  The patient spent time with his mother and larger family over the holidays and this went well.  The patient reports that there have been some improvements with his son's behavior and his son is now sleeping in his own bedroom almost all nights now.  This has been an improvement.  The sinus had difficulty with maturational issues but he is doing much better now.  The patient reports that stressors between he and his wife have improved and he is working on more coping strategies and that area.  We continue to work on coping skills and strategies for managing his efforts where he is working now in [employer] and reports that he continues to do well there and has gotten a recent pay raise.  The patient reports that this work is very helpful to reinforce and try to improve his memory functions.  The patient reports that his mood has been generally good lately.

## 2022-08-28 ENCOUNTER — Other Ambulatory Visit: Payer: Self-pay | Admitting: Physical Medicine & Rehabilitation

## 2022-08-28 DIAGNOSIS — S066X1S Traumatic subarachnoid hemorrhage with loss of consciousness of 30 minutes or less, sequela: Secondary | ICD-10-CM

## 2022-08-29 ENCOUNTER — Other Ambulatory Visit (HOSPITAL_COMMUNITY): Payer: Self-pay

## 2022-08-29 MED ORDER — METHYLPHENIDATE HCL ER (OSM) 36 MG PO TBCR
36.0000 mg | EXTENDED_RELEASE_TABLET | Freq: Every day | ORAL | 0 refills | Status: DC
  Filled 2022-08-29 – 2022-08-31 (×3): qty 30, 30d supply, fill #0

## 2022-08-29 NOTE — Telephone Encounter (Signed)
error 

## 2022-08-30 ENCOUNTER — Other Ambulatory Visit: Payer: Self-pay

## 2022-08-30 ENCOUNTER — Other Ambulatory Visit (HOSPITAL_COMMUNITY): Payer: Self-pay

## 2022-08-31 ENCOUNTER — Other Ambulatory Visit (HOSPITAL_COMMUNITY): Payer: Self-pay

## 2022-09-23 ENCOUNTER — Other Ambulatory Visit: Payer: Self-pay | Admitting: Physical Medicine & Rehabilitation

## 2022-09-23 DIAGNOSIS — S066X1S Traumatic subarachnoid hemorrhage with loss of consciousness of 30 minutes or less, sequela: Secondary | ICD-10-CM

## 2022-09-25 ENCOUNTER — Other Ambulatory Visit (HOSPITAL_BASED_OUTPATIENT_CLINIC_OR_DEPARTMENT_OTHER): Payer: Self-pay

## 2022-09-25 MED ORDER — METHYLPHENIDATE HCL ER (OSM) 36 MG PO TBCR
36.0000 mg | EXTENDED_RELEASE_TABLET | Freq: Every day | ORAL | 0 refills | Status: DC
  Filled 2022-09-25: qty 30, 30d supply, fill #0
  Filled ????-??-??: fill #0

## 2022-09-26 ENCOUNTER — Other Ambulatory Visit (HOSPITAL_BASED_OUTPATIENT_CLINIC_OR_DEPARTMENT_OTHER): Payer: Self-pay

## 2022-09-26 ENCOUNTER — Other Ambulatory Visit (HOSPITAL_COMMUNITY): Payer: Self-pay

## 2022-09-27 ENCOUNTER — Other Ambulatory Visit (HOSPITAL_BASED_OUTPATIENT_CLINIC_OR_DEPARTMENT_OTHER): Payer: Self-pay

## 2022-09-27 ENCOUNTER — Encounter
Payer: No Typology Code available for payment source | Attending: Physical Medicine & Rehabilitation | Admitting: Physical Medicine & Rehabilitation

## 2022-09-27 ENCOUNTER — Encounter: Payer: Self-pay | Admitting: Physical Medicine & Rehabilitation

## 2022-09-27 VITALS — BP 130/81 | HR 64 | Ht 72.0 in | Wt 211.0 lb

## 2022-09-27 DIAGNOSIS — G894 Chronic pain syndrome: Secondary | ICD-10-CM | POA: Insufficient documentation

## 2022-09-27 DIAGNOSIS — S066X1S Traumatic subarachnoid hemorrhage with loss of consciousness of 30 minutes or less, sequela: Secondary | ICD-10-CM | POA: Diagnosis not present

## 2022-09-27 DIAGNOSIS — Z79899 Other long term (current) drug therapy: Secondary | ICD-10-CM | POA: Diagnosis not present

## 2022-09-27 DIAGNOSIS — Z5181 Encounter for therapeutic drug level monitoring: Secondary | ICD-10-CM | POA: Diagnosis not present

## 2022-09-27 MED ORDER — METHYLPHENIDATE HCL ER (OSM) 36 MG PO TBCR
36.0000 mg | EXTENDED_RELEASE_TABLET | Freq: Every day | ORAL | 0 refills | Status: DC
  Filled 2022-09-27 – 2022-10-31 (×2): qty 30, 30d supply, fill #0

## 2022-09-27 MED ORDER — METHYLPHENIDATE HCL ER (OSM) 36 MG PO TBCR
36.0000 mg | EXTENDED_RELEASE_TABLET | Freq: Every day | ORAL | 0 refills | Status: DC
  Filled 2022-09-27: qty 30, 30d supply, fill #0

## 2022-09-27 NOTE — Patient Instructions (Signed)
ALWAYS FEEL FREE TO CALL OUR OFFICE WITH ANY PROBLEMS OR QUESTIONS ([phone])  **PLEASE NOTE** ALL MEDICATION REFILL REQUESTS (INCLUDING CONTROLLED SUBSTANCES) NEED TO BE MADE AT LEAST 7 DAYS PRIOR TO REFILL BEING DUE. ANY REFILL REQUESTS INSIDE THAT TIME FRAME MAY RESULT IN DELAYS IN RECEIVING YOUR PRESCRIPTION.                   IF YOU WANT TO REDUCE EXELON, LET ME KNOW.

## 2022-09-27 NOTE — Progress Notes (Signed)
Subjective:    Patient ID: Albert [name], male    DOB: May 22, 1976, 47 y.o.   MRN: [id number]  HPI  Albert [name] is here in follow up of his TBI and associated deficits. Things have been fairly steady. He remains involved with his family. His mood has been fairly stable. Cognitively he's at baseline. He understands his limitations and is able to work around them. He remains on ritalin to help improve his attention and focus.      Pain Inventory Average Pain 4 Pain Right Now 0 My pain is intermittent, sharp, and aching  LOCATION OF PAIN  HEAD  BOWEL Number of stools per week: 10  BLADDER Normal    Mobility how many minutes can you walk? 4 HOURS ability to climb steps?  yes do you drive?  no Do you have any goals in this area?  yes  Function  employed # of hrs/week 16-20 Hours per week  Neuro/Psych numbness  Prior Studies Any changes since last visit?  no  Physicians involved in your care Any changes since last visit?  no   Family History  Problem Relation Age of Onset   Heart disease Father    Social History   Socioeconomic History   Marital status: Married    Spouse name: Not on file   Number of children: Not on file   Years of education: Not on file   Highest education level: Not on file  Occupational History   Not on file  Tobacco Use   Smoking status: Former    Types: Cigarettes    Quit date: 12/17/1999    Years since quitting: 22.7   Smokeless tobacco: Never  Vaping Use   Vaping Use: Never used  Substance and Sexual Activity   Alcohol use: Yes   Drug use: No   Sexual activity: Not on file  Other Topics Concern   Not on file  Social History Narrative   Not on file   Social Determinants of Health   Financial Resource Strain: Not on file  Food Insecurity: Not on file  Transportation Needs: Not on file  Physical Activity: Not on file  Stress: Not on file  Social Connections: Not on file   Past Surgical History:  Procedure Laterality  Date   CRANIOPLASTY N/A 06/10/2013   Procedure: CRANIOPLASTY;  Surgeon: Floyce Stakes, MD;  Location: [facility];  Service: Neurosurgery;  Laterality: N/A;  CRANIOPLASTY   CRANIOTOMY Left 12/15/2012   Procedure: CRANIECTOMY HEMATOMA EVACUATION SUBDURAL WITH PLACEMENT OF BONE FLAP IN ABDOMINAL WALL;  Surgeon: Floyce Stakes, MD;  Location: [place] [facility];  Service: Neurosurgery;  Laterality: Left;   CRANIOTOMY Left 12/28/2012   Procedure: CRANIOTOMY HEMATOMA EVACUATION SUBDURAL;  Surgeon: Erline Levine, MD;  Location: [place] [facility];  Service: Neurosurgery;  Laterality: Left;   PEG PLACEMENT N/A 12/31/2012   Procedure: PERCUTANEOUS ENDOSCOPIC GASTROSTOMY (PEG) PLACEMENT;  Surgeon: Zenovia Jarred, MD;  Location: [place];  Service: General;  Laterality: N/A;  bedside  peg   PERCUTANEOUS TRACHEOSTOMY N/A 12/31/2012   Procedure: PERCUTANEOUS TRACHEOSTOMY (BEDSIDE);  Surgeon: Zenovia Jarred, MD;  Location: [place];  Service: General;  Laterality: N/A;   WRIST SURGERY Right 2008   Past Medical History:  Diagnosis Date   Headache(784.0)    Rotator cuff injury    right   Seizures ([place])    12/28/2012   Stroke ([place])    some aphasia , weakness in the R arm , Out pt. rehab currently  Swallowing problem    still relearning controlled swallowing - rec'ing speech therapy in rehab   Traumatic brain injury ([place])    BP 130/81   Pulse 64   Ht 6' (1.829 m)   Wt 211 lb (95.7 kg)   SpO2 96%   BMI 28.62 kg/m   Opioid Risk Score:   Fall Risk Score:  `1  Depression screen [facility] 2/9     09/27/2022    3:17 PM 07/19/2022    8:35 AM 01/11/2022    8:43 AM 07/13/2021    9:37 AM 01/05/2021   10:03 AM 09/01/2020    9:55 AM 01/07/2020    9:22 AM  Depression screen PHQ 2/9  Decreased Interest 0 0 0 0 0 0 0  Down, Depressed, Hopeless 0 0 0 0 0 0 0  PHQ - 2 Score 0 0 0 0 0 0 0    Review of Systems  Neurological:  Positive for numbness and headaches.  All other systems reviewed and are negative.       Objective:   Physical Exam  General: No acute distress HEENT: NCAT, EOMI, oral membranes moist Cards: reg rate  Chest: normal effort Abdomen: Soft, NT, ND Skin: dry, intact Extremities: no edema Psych: pleasant and appropriate  Neuro: focused attention. functional insight and awareness. Better functional memory.  Right HH stable  Musculoskeletal:  normal gait.          Assessment:  1. 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 Status post craniotomy evacuation of hematoma. Persistent ongoing cognitive deficits as well as right visual field loss---compensates fairly nice 2. Old right shoulder injury, likely RTC tendonitis- generally resolved  3. Seizure post- traumatic  4. Scrotal swelling, pain----resolved 5. Post-traumatic headaches---resolved       Plan:  1. Maintain dose propranolol for headache and mood---this has been effective 2. Can stop exelon  4.'5mg'$  BID and see if it has any adverse affects on memory.  3. Concerta for attention and memory.   second rx for next mos             We will continue the controlled substance monitoring program, this consists of regular clinic visits, examinations, routine drug screening, pill counts as well as use of [place] Controlled Substance Reporting System. [place] was [place] today.    -second RF provided              -may call for RF in about 2 months 4. Continue keppra '500mg'$  bid 5. sleep hygiene improved 6. 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      15 minutes of face to face patient care time were spent during this visit. All questions were encouraged and answered.  Follow up with me in 6 mos .

## 2022-09-29 ENCOUNTER — Other Ambulatory Visit (HOSPITAL_BASED_OUTPATIENT_CLINIC_OR_DEPARTMENT_OTHER): Payer: Self-pay

## 2022-09-30 LAB — TOXASSURE SELECT,+ANTIDEPR,UR

## 2022-10-03 ENCOUNTER — Encounter (HOSPITAL_BASED_OUTPATIENT_CLINIC_OR_DEPARTMENT_OTHER): Payer: No Typology Code available for payment source | Admitting: Psychology

## 2022-10-03 DIAGNOSIS — Z79899 Other long term (current) drug therapy: Secondary | ICD-10-CM | POA: Diagnosis not present

## 2022-10-03 DIAGNOSIS — Z5181 Encounter for therapeutic drug level monitoring: Secondary | ICD-10-CM | POA: Diagnosis not present

## 2022-10-03 DIAGNOSIS — F068 Other specified mental disorders due to known physiological condition: Secondary | ICD-10-CM | POA: Diagnosis not present

## 2022-10-03 DIAGNOSIS — S069X0S Unspecified intracranial injury without loss of consciousness, sequela: Secondary | ICD-10-CM | POA: Diagnosis not present

## 2022-10-03 DIAGNOSIS — S065X9S Traumatic subdural hemorrhage with loss of consciousness of unspecified duration, sequela: Secondary | ICD-10-CM

## 2022-10-03 DIAGNOSIS — S069XAS Unspecified intracranial injury with loss of consciousness status unknown, sequela: Secondary | ICD-10-CM

## 2022-10-03 DIAGNOSIS — S066X1S Traumatic subarachnoid hemorrhage with loss of consciousness of 30 minutes or less, sequela: Secondary | ICD-10-CM | POA: Diagnosis not present

## 2022-10-03 DIAGNOSIS — F0630 Mood disorder due to known physiological condition, unspecified: Secondary | ICD-10-CM

## 2022-10-03 DIAGNOSIS — G894 Chronic pain syndrome: Secondary | ICD-10-CM | POA: Diagnosis not present

## 2022-10-05 ENCOUNTER — Encounter: Payer: Self-pay | Admitting: Physical Medicine & Rehabilitation

## 2022-10-10 ENCOUNTER — Other Ambulatory Visit: Payer: Self-pay | Admitting: Physical Medicine & Rehabilitation

## 2022-10-26 ENCOUNTER — Telehealth: Payer: Self-pay | Admitting: Physical Medicine & Rehabilitation

## 2022-10-26 NOTE — Telephone Encounter (Signed)
Requesting medication refill for methylphenidate.  Please send to med center [facility]

## 2022-10-30 ENCOUNTER — Other Ambulatory Visit (HOSPITAL_BASED_OUTPATIENT_CLINIC_OR_DEPARTMENT_OTHER): Payer: Self-pay

## 2022-10-30 NOTE — Telephone Encounter (Signed)
noted 

## 2022-10-31 ENCOUNTER — Other Ambulatory Visit (HOSPITAL_BASED_OUTPATIENT_CLINIC_OR_DEPARTMENT_OTHER): Payer: Self-pay

## 2022-10-31 ENCOUNTER — Encounter
Payer: No Typology Code available for payment source | Attending: Physical Medicine & Rehabilitation | Admitting: Psychology

## 2022-10-31 DIAGNOSIS — F068 Other specified mental disorders due to known physiological condition: Secondary | ICD-10-CM | POA: Diagnosis not present

## 2022-10-31 DIAGNOSIS — S069XAS Unspecified intracranial injury with loss of consciousness status unknown, sequela: Secondary | ICD-10-CM | POA: Diagnosis not present

## 2022-10-31 DIAGNOSIS — F0630 Mood disorder due to known physiological condition, unspecified: Secondary | ICD-10-CM | POA: Insufficient documentation

## 2022-10-31 DIAGNOSIS — S066X1S Traumatic subarachnoid hemorrhage with loss of consciousness of 30 minutes or less, sequela: Secondary | ICD-10-CM | POA: Diagnosis not present

## 2022-10-31 DIAGNOSIS — S069X0S Unspecified intracranial injury without loss of consciousness, sequela: Secondary | ICD-10-CM | POA: Insufficient documentation

## 2022-10-31 DIAGNOSIS — G894 Chronic pain syndrome: Secondary | ICD-10-CM | POA: Diagnosis not present

## 2022-11-01 ENCOUNTER — Encounter: Payer: Self-pay | Admitting: Psychology
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
## 2022-11-22 ENCOUNTER — Other Ambulatory Visit: Payer: Self-pay | Admitting: Physical Medicine & Rehabilitation

## 2022-11-22 DIAGNOSIS — S066X1S Traumatic subarachnoid hemorrhage with loss of consciousness of 30 minutes or less, sequela: Secondary | ICD-10-CM

## 2022-11-27 ENCOUNTER — Other Ambulatory Visit: Payer: Self-pay

## 2022-11-27 ENCOUNTER — Other Ambulatory Visit (HOSPITAL_BASED_OUTPATIENT_CLINIC_OR_DEPARTMENT_OTHER): Payer: Self-pay

## 2022-11-27 MED ORDER — METHYLPHENIDATE HCL ER (OSM) 36 MG PO TBCR
36.0000 mg | EXTENDED_RELEASE_TABLET | Freq: Every day | ORAL | 0 refills | Status: DC
  Filled 2022-11-27: qty 8, 8d supply, fill #0
  Filled 2022-11-27: qty 22, 22d supply, fill #0

## 2022-11-27 MED ORDER — METHYLPHENIDATE HCL ER (OSM) 36 MG PO TBCR
36.0000 mg | EXTENDED_RELEASE_TABLET | Freq: Every day | ORAL | 0 refills | Status: DC
  Filled 2022-11-27 – 2022-12-28 (×2): qty 30, 30d supply, fill #0

## 2022-11-27 NOTE — Telephone Encounter (Signed)
RF's were sent for March AND April to [place] [facility]

## 2022-11-28 ENCOUNTER — Encounter
Payer: No Typology Code available for payment source | Attending: Physical Medicine & Rehabilitation | Admitting: Psychology

## 2022-11-28 DIAGNOSIS — G894 Chronic pain syndrome: Secondary | ICD-10-CM | POA: Diagnosis not present

## 2022-11-28 DIAGNOSIS — F068 Other specified mental disorders due to known physiological condition: Secondary | ICD-10-CM | POA: Diagnosis not present

## 2022-11-28 DIAGNOSIS — S066X1S Traumatic subarachnoid hemorrhage with loss of consciousness of 30 minutes or less, sequela: Secondary | ICD-10-CM | POA: Diagnosis not present

## 2022-11-28 DIAGNOSIS — F0630 Mood disorder due to known physiological condition, unspecified: Secondary | ICD-10-CM | POA: Diagnosis not present

## 2022-11-28 DIAGNOSIS — S069X0S Unspecified intracranial injury without loss of consciousness, sequela: Secondary | ICD-10-CM | POA: Insufficient documentation

## 2022-11-28 DIAGNOSIS — S069XAS Unspecified intracranial injury with loss of consciousness status unknown, sequela: Secondary | ICD-10-CM | POA: Diagnosis not present

## 2022-12-05 ENCOUNTER — Encounter: Payer: Self-pay | Admitting: Psychology

## 2022-12-05 NOTE — Progress Notes (Signed)
10/03/2022 11 AM-12 PM  Today's visit was an in person visit conducted in my outpatient clinic office.  The patient myself were both present for this visit.  The patient reports that he was able to get through the Thanksgiving holiday effectively and his family has been decorating his house and getting ready for Christmas which is a big time of the year for both his immediate family and extended family.  The patient spent time with his mother and larger family over the holidays and this went well.  The patient reports that there have been some improvements with his son's behavior and his son is now sleeping in his own bedroom almost all nights now.  This has been an improvement.  The sinus had difficulty with maturational issues but he is doing much better now.  The patient reports that stressors between he and his wife have improved and he is working on more coping strategies and that area.  We continue to work on coping skills and strategies for managing his efforts where he is working now in [employer] and reports that he continues to do well there and has gotten a recent pay raise.  The patient reports that this work is very helpful to reinforce and try to improve his memory functions.  The patient reports that his mood has been generally good lately.

## 2022-12-07 ENCOUNTER — Encounter: Payer: Self-pay | Admitting: Psychology
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
## 2022-12-09 ENCOUNTER — Other Ambulatory Visit: Payer: Self-pay | Admitting: Physical Medicine & Rehabilitation

## 2022-12-09 DIAGNOSIS — S069XAS Unspecified intracranial injury with loss of consciousness status unknown, sequela: Secondary | ICD-10-CM

## 2022-12-09 DIAGNOSIS — S066X1S Traumatic subarachnoid hemorrhage with loss of consciousness of 30 minutes or less, sequela: Secondary | ICD-10-CM

## 2022-12-28 ENCOUNTER — Other Ambulatory Visit: Payer: Self-pay

## 2022-12-28 ENCOUNTER — Other Ambulatory Visit (HOSPITAL_BASED_OUTPATIENT_CLINIC_OR_DEPARTMENT_OTHER): Payer: Self-pay

## 2022-12-29 ENCOUNTER — Other Ambulatory Visit: Payer: Self-pay

## 2022-12-29 ENCOUNTER — Other Ambulatory Visit (HOSPITAL_BASED_OUTPATIENT_CLINIC_OR_DEPARTMENT_OTHER): Payer: Self-pay

## 2023-01-04 DIAGNOSIS — E782 Mixed hyperlipidemia: Secondary | ICD-10-CM | POA: Diagnosis not present

## 2023-01-04 DIAGNOSIS — R197 Diarrhea, unspecified: Secondary | ICD-10-CM | POA: Diagnosis not present

## 2023-01-04 DIAGNOSIS — Z1211 Encounter for screening for malignant neoplasm of colon: Secondary | ICD-10-CM | POA: Diagnosis not present

## 2023-01-04 DIAGNOSIS — I10 Essential (primary) hypertension: Secondary | ICD-10-CM | POA: Diagnosis not present

## 2023-01-16 DIAGNOSIS — G4700 Insomnia, unspecified: Secondary | ICD-10-CM | POA: Diagnosis not present

## 2023-01-16 DIAGNOSIS — F331 Major depressive disorder, recurrent, moderate: Secondary | ICD-10-CM | POA: Diagnosis not present

## 2023-01-16 DIAGNOSIS — F411 Generalized anxiety disorder: Secondary | ICD-10-CM | POA: Diagnosis not present

## 2023-01-17 ENCOUNTER — Encounter: Payer: No Typology Code available for payment source | Admitting: Physical Medicine & Rehabilitation

## 2023-01-25 ENCOUNTER — Other Ambulatory Visit: Payer: Self-pay | Admitting: Physical Medicine & Rehabilitation

## 2023-01-25 ENCOUNTER — Other Ambulatory Visit (HOSPITAL_BASED_OUTPATIENT_CLINIC_OR_DEPARTMENT_OTHER): Payer: Self-pay

## 2023-01-25 DIAGNOSIS — S066X1S Traumatic subarachnoid hemorrhage with loss of consciousness of 30 minutes or less, sequela: Secondary | ICD-10-CM

## 2023-01-25 MED ORDER — METHYLPHENIDATE HCL ER (OSM) 36 MG PO TBCR
36.0000 mg | EXTENDED_RELEASE_TABLET | Freq: Every day | ORAL | 0 refills | Status: DC
  Filled 2023-01-25 – 2023-01-26 (×2): qty 30, 30d supply, fill #0

## 2023-01-25 NOTE — Telephone Encounter (Signed)
PMP was Reviewed.  Dr Riley Kill note was reviewed.  Concerta e-scribed to pharmacy  Albert [name] was called regarding the above, she verbalizes understanding.

## 2023-01-26 ENCOUNTER — Other Ambulatory Visit (HOSPITAL_BASED_OUTPATIENT_CLINIC_OR_DEPARTMENT_OTHER): Payer: Self-pay

## 2023-01-26 ENCOUNTER — Other Ambulatory Visit: Payer: Self-pay

## 2023-01-30 ENCOUNTER — Encounter
Payer: No Typology Code available for payment source | Attending: Physical Medicine & Rehabilitation | Admitting: Psychology

## 2023-01-30 DIAGNOSIS — S069XAS Unspecified intracranial injury with loss of consciousness status unknown, sequela: Secondary | ICD-10-CM | POA: Diagnosis not present

## 2023-01-30 DIAGNOSIS — S069X0S Unspecified intracranial injury without loss of consciousness, sequela: Secondary | ICD-10-CM | POA: Insufficient documentation

## 2023-01-30 DIAGNOSIS — S066X1S Traumatic subarachnoid hemorrhage with loss of consciousness of 30 minutes or less, sequela: Secondary | ICD-10-CM | POA: Diagnosis not present

## 2023-01-30 DIAGNOSIS — F0630 Mood disorder due to known physiological condition, unspecified: Secondary | ICD-10-CM

## 2023-01-30 DIAGNOSIS — G894 Chronic pain syndrome: Secondary | ICD-10-CM | POA: Diagnosis not present

## 2023-01-30 DIAGNOSIS — F068 Other specified mental disorders due to known physiological condition: Secondary | ICD-10-CM

## 2023-01-31 ENCOUNTER — Encounter: Payer: Self-pay | Admitting: Psychology
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
## 2023-02-21 ENCOUNTER — Encounter
Payer: No Typology Code available for payment source | Attending: Physical Medicine & Rehabilitation | Admitting: Psychology

## 2023-02-21 ENCOUNTER — Other Ambulatory Visit (HOSPITAL_BASED_OUTPATIENT_CLINIC_OR_DEPARTMENT_OTHER): Payer: Self-pay

## 2023-02-21 ENCOUNTER — Telehealth: Payer: Self-pay

## 2023-02-21 DIAGNOSIS — F068 Other specified mental disorders due to known physiological condition: Secondary | ICD-10-CM

## 2023-02-21 DIAGNOSIS — S069X0S Unspecified intracranial injury without loss of consciousness, sequela: Secondary | ICD-10-CM | POA: Diagnosis not present

## 2023-02-21 DIAGNOSIS — S066X1S Traumatic subarachnoid hemorrhage with loss of consciousness of 30 minutes or less, sequela: Secondary | ICD-10-CM

## 2023-02-21 DIAGNOSIS — F0630 Mood disorder due to known physiological condition, unspecified: Secondary | ICD-10-CM | POA: Diagnosis not present

## 2023-02-21 DIAGNOSIS — S069XAS Unspecified intracranial injury with loss of consciousness status unknown, sequela: Secondary | ICD-10-CM | POA: Diagnosis not present

## 2023-02-21 DIAGNOSIS — G894 Chronic pain syndrome: Secondary | ICD-10-CM | POA: Diagnosis not present

## 2023-02-21 MED ORDER — METHYLPHENIDATE HCL ER (OSM) 36 MG PO TBCR
36.0000 mg | EXTENDED_RELEASE_TABLET | Freq: Every day | ORAL | 0 refills | Status: DC
  Filled 2023-02-21 – 2023-02-27 (×2): qty 30, 30d supply, fill #0

## 2023-02-21 NOTE — Telephone Encounter (Signed)
Patient aware.

## 2023-02-21 NOTE — Addendum Note (Signed)
Addended by: Faith Rogue T on: 02/21/2023 04:34 PM   Modules accepted: Orders

## 2023-02-21 NOTE — Telephone Encounter (Signed)
Mr. Elena has requested an early refill of Methylphenidate ER 36 mg. He is going out of town and when he returns he will have one tablet on hand. Will you send in a refill to be released on 6/182024. So he will be covered when he returns.    Total: 26  Private Pay: 0 Showing 1-15 of 26 Items View 15 Items   1  of 2  Filled  Written  ID  Drug  QTY  Days  Prescriber  RX #  Dispenser  Refill  Daily Dose*  Pymt Type  PMP  01/26/2023 01/25/2023 3  Methylphenidate Er 36 Mg Tab 30.00 30 Eu Tho [id number] Con (7708) 0/0  Other [place] 12/29/2022 11/27/2022 3  Methylphenidate Er 36 Mg Tab 30.00 30 Za Swa [id number] Con (7708) 0/0  Other [place] 11/27/2022 11/27/2022 3  Methylphenidate Er 36 Mg Tab 8.00 8 Za Swa [id number] Con (7708) 0/0  Other [place]

## 2023-02-21 NOTE — Telephone Encounter (Signed)
RX written for 6/18  thanks!

## 2023-02-26 ENCOUNTER — Other Ambulatory Visit (HOSPITAL_BASED_OUTPATIENT_CLINIC_OR_DEPARTMENT_OTHER): Payer: Self-pay

## 2023-02-27 ENCOUNTER — Telehealth: Payer: Self-pay | Admitting: *Deleted

## 2023-02-27 ENCOUNTER — Other Ambulatory Visit (HOSPITAL_BASED_OUTPATIENT_CLINIC_OR_DEPARTMENT_OTHER): Payer: Self-pay

## 2023-02-27 DIAGNOSIS — R058 Other specified cough: Secondary | ICD-10-CM | POA: Diagnosis not present

## 2023-02-27 DIAGNOSIS — F411 Generalized anxiety disorder: Secondary | ICD-10-CM | POA: Diagnosis not present

## 2023-02-27 DIAGNOSIS — F331 Major depressive disorder, recurrent, moderate: Secondary | ICD-10-CM | POA: Diagnosis not present

## 2023-02-27 DIAGNOSIS — I10 Essential (primary) hypertension: Secondary | ICD-10-CM | POA: Diagnosis not present

## 2023-02-27 DIAGNOSIS — G4700 Insomnia, unspecified: Secondary | ICD-10-CM | POA: Diagnosis not present

## 2023-02-27 DIAGNOSIS — S066X1S Traumatic subarachnoid hemorrhage with loss of consciousness of 30 minutes or less, sequela: Secondary | ICD-10-CM

## 2023-02-27 DIAGNOSIS — Z1159 Encounter for screening for other viral diseases: Secondary | ICD-10-CM | POA: Diagnosis not present

## 2023-02-27 MED ORDER — METHYLPHENIDATE HCL ER (OSM) 36 MG PO TBCR: 36.0000 mg | EXTENDED_RELEASE_TABLET | Freq: Every day | ORAL | 0 refills | Status: DC

## 2023-02-27 NOTE — Telephone Encounter (Signed)
Sent by Maisie Fus Honor  : Can you fill my generic Concerta at the [facility] on [address], where all my other meds are filled? I called [facility] and they out of stock but [facility] has it. [facility] is getting more meds today but the lady couldn't tell me if the Medi need was included on that order. Can someone please call me [phone] to let me know if y'all have filled it and where it was filled? I am out of town tomorrow - Saturday and my last pill will be on Sunday

## 2023-02-27 NOTE — Telephone Encounter (Signed)
Rx written and sent to the pharmacy. Thanks!  

## 2023-02-27 NOTE — Addendum Note (Signed)
Addended by: Faith Rogue T on: 02/27/2023 03:26 PM   Modules accepted: Orders
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
## 2023-03-20 DIAGNOSIS — J309 Allergic rhinitis, unspecified: Secondary | ICD-10-CM | POA: Diagnosis not present

## 2023-03-20 DIAGNOSIS — G4700 Insomnia, unspecified: Secondary | ICD-10-CM | POA: Diagnosis not present

## 2023-03-20 DIAGNOSIS — I10 Essential (primary) hypertension: Secondary | ICD-10-CM | POA: Diagnosis not present

## 2023-03-28 ENCOUNTER — Encounter: Payer: Self-pay | Admitting: Physical Medicine & Rehabilitation

## 2023-03-28 ENCOUNTER — Encounter
Payer: No Typology Code available for payment source | Attending: Physical Medicine & Rehabilitation | Admitting: Physical Medicine & Rehabilitation

## 2023-03-28 ENCOUNTER — Telehealth: Payer: Self-pay

## 2023-03-28 VITALS — BP 138/88 | HR 64 | Ht 72.0 in | Wt 204.8 lb

## 2023-03-28 DIAGNOSIS — S066X1S Traumatic subarachnoid hemorrhage with loss of consciousness of 30 minutes or less, sequela: Secondary | ICD-10-CM | POA: Diagnosis not present

## 2023-03-28 DIAGNOSIS — F068 Other specified mental disorders due to known physiological condition: Secondary | ICD-10-CM | POA: Diagnosis not present

## 2023-03-28 DIAGNOSIS — S069X0S Unspecified intracranial injury without loss of consciousness, sequela: Secondary | ICD-10-CM | POA: Insufficient documentation

## 2023-03-28 DIAGNOSIS — H53461 Homonymous bilateral field defects, right side: Secondary | ICD-10-CM | POA: Diagnosis not present

## 2023-03-28 DIAGNOSIS — S065X9S Traumatic subdural hemorrhage with loss of consciousness of unspecified duration, sequela: Secondary | ICD-10-CM | POA: Insufficient documentation

## 2023-03-28 MED ORDER — METHYLPHENIDATE HCL ER (OSM) 36 MG PO TBCR: 36.0000 mg | EXTENDED_RELEASE_TABLET | Freq: Every day | ORAL | 0 refills | Status: DC

## 2023-03-28 NOTE — Telephone Encounter (Signed)
Patient called requesting for his Methylphenidate to sent to [facility] on the corner of [address]. I called Karin Golden and cancelled his prescription

## 2023-03-28 NOTE — Progress Notes (Signed)
Subjective:    Patient ID: Albert [name], male    DOB: 10-Nov-1975, 47 y.o.   MRN: [id number]  HPI  Albert [name] is here in follow up of his TBI.  He states that he is doing well.  He has been an uneventful summer.  He remains on his Concerta as prescribed.  Does help with his concentration and arousal during the day.  He feels quite good until about 7 PM and then begins to lose some energy.  Interestingly he picks up some energy again around 830 for a period of time before he goes to bed.  He is sleeping about 6 hours per night.  He does fairly well with kids and family at home.  He remains on Exelon as well for memory.  I asked him if he felt it was still helping him and he states that he is not sure at this point.  Pain Inventory Average Pain 5 Pain Right Now 0 My pain is sharp  In the last 24 hours, has pain interfered with the following? General activity 0 Relation with others 0 Enjoyment of life 0 What TIME of day is your pain at its worst? evening Sleep (in general) NA  Pain is worse with: standing Pain improves with:  nothing Relief from Meds: 0  Family History  Problem Relation Age of Onset   Heart disease Father    Social History   Socioeconomic History   Marital status: Married    Spouse name: Not on file   Number of children: Not on file   Years of education: Not on file   Highest education level: Not on file  Occupational History   Not on file  Tobacco Use   Smoking status: Former    Current packs/day: 0.00    Types: Cigarettes    Quit date: 12/17/1999    Years since quitting: 23.2   Smokeless tobacco: Never  Vaping Use   Vaping status: Never Used  Substance and Sexual Activity   Alcohol use: Yes   Drug use: No   Sexual activity: Not on file  Other Topics Concern   Not on file  Social History Narrative   Not on file   Social Determinants of Health   Financial Resource Strain: Not on file  Food Insecurity: Not on file  Transportation Needs: Not on file   Physical Activity: Not on file  Stress: Not on file  Social Connections: Not on file   Past Surgical History:  Procedure Laterality Date   CRANIOPLASTY N/A 06/10/2013   Procedure: CRANIOPLASTY;  Surgeon: Karn Cassis, MD;  Location: [facility];  Service: Neurosurgery;  Laterality: N/A;  CRANIOPLASTY   CRANIOTOMY Left 12/15/2012   Procedure: CRANIECTOMY HEMATOMA EVACUATION SUBDURAL WITH PLACEMENT OF BONE FLAP IN ABDOMINAL WALL;  Surgeon: Karn Cassis, MD;  Location: [facility];  Service: Neurosurgery;  Laterality: Left;   CRANIOTOMY Left 12/28/2012   Procedure: CRANIOTOMY HEMATOMA EVACUATION SUBDURAL;  Surgeon: Maeola Harman, MD;  Location: [facility];  Service: Neurosurgery;  Laterality: Left;   PEG PLACEMENT N/A 12/31/2012   Procedure: PERCUTANEOUS ENDOSCOPIC GASTROSTOMY (PEG) PLACEMENT;  Surgeon: Liz Malady, MD;  Location: [facility] ENDOSCOPY;  Service: General;  Laterality: N/A;  bedside  peg   PERCUTANEOUS TRACHEOSTOMY N/A 12/31/2012   Procedure: PERCUTANEOUS TRACHEOSTOMY (BEDSIDE);  Surgeon: Liz Malady, MD;  [facility];  Service: General;  Laterality: N/A;   WRIST SURGERY Right 2008   Past Surgical History:  Procedure Laterality Date  CRANIOPLASTY N/A 06/10/2013   Procedure: CRANIOPLASTY;  Surgeon: Karn Cassis, MD;  Location: [facility];  Service: Neurosurgery;  Laterality: N/A;  CRANIOPLASTY   CRANIOTOMY Left 12/15/2012   Procedure: CRANIECTOMY HEMATOMA EVACUATION SUBDURAL WITH PLACEMENT OF BONE FLAP IN ABDOMINAL WALL;  Surgeon: Karn Cassis, MD;  Location: [facility];  Service: Neurosurgery;  Laterality: Left;   CRANIOTOMY Left 12/28/2012   Procedure: CRANIOTOMY HEMATOMA EVACUATION SUBDURAL;  Surgeon: Maeola Harman, MD;  Location: [facility];  Service: Neurosurgery;  Laterality: Left;   PEG PLACEMENT N/A 12/31/2012   Procedure: PERCUTANEOUS ENDOSCOPIC GASTROSTOMY (PEG) PLACEMENT;  Surgeon: Liz Malady, MD;  Location: [facility] ENDOSCOPY;  Service: General;   Laterality: N/A;  bedside  peg   PERCUTANEOUS TRACHEOSTOMY N/A 12/31/2012   Procedure: PERCUTANEOUS TRACHEOSTOMY (BEDSIDE);  Surgeon: Liz Malady, MD;  Location: [facility] OR;  Service: General;  Laterality: N/A;   WRIST SURGERY Right 2008   Past Medical History:  Diagnosis Date   Headache(784.0)    Rotator cuff injury    right   Seizures (HCC)    12/28/2012   Stroke (HCC)    some aphasia , weakness in the R arm , Out pt. rehab currently    Swallowing problem    still relearning controlled swallowing - rec'ing speech therapy in rehab   Traumatic brain injury (HCC)    BP 138/88   Pulse 64   Ht 6' (1.829 m)   Wt 204 lb 12.8 oz (92.9 kg)   SpO2 97%   BMI 27.78 kg/m   Opioid Risk Score:   Fall Risk Score:  `1  Depression screen [facility] 2/9     09/27/2022    3:17 PM 07/19/2022    8:35 AM 01/11/2022    8:43 AM 07/13/2021    9:37 AM 01/05/2021   10:03 AM 09/01/2020    9:55 AM 01/07/2020    9:22 AM  Depression screen PHQ 2/9  Decreased Interest 0 0 0 0 0 0 0  Down, Depressed, Hopeless 0 0 0 0 0 0 0  PHQ - 2 Score 0 0 0 0 0 0 0     Review of Systems  Constitutional: Negative.   HENT: Negative.    Eyes: Negative.   Respiratory: Negative.    Cardiovascular: Negative.   Gastrointestinal: Negative.   Endocrine: Negative.   Genitourinary: Negative.   Musculoskeletal:        Bilateral feet  Skin: Negative.   Allergic/Immunologic: Positive for environmental allergies.  Neurological:  Positive for headaches.  Hematological: Negative.   Psychiatric/Behavioral: Negative.    All other systems reviewed and are negative.      Objective:   Physical Exam  General: No acute distress HEENT: NCAT, EOMI, oral membranes moist Cards: reg rate  Chest: normal effort Abdomen: Soft, NT, ND Skin: dry, intact Extremities: no edema Psych: pleasant and appropriate   Neuro: fairly focused attention. functional insight and awareness. Better functional memory.  Right HH stable   Musculoskeletal:  normal gait.          Assessment:  1. 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 Status post craniotomy evacuation of hematoma. Persistent ongoing cognitive deficits as well as right visual field loss---compensates fairly nice 2. Old right shoulder injury, likely RTC tendonitis- generally resolved  3. Seizure post- traumatic  4. Scrotal swelling, pain----resolved 5. Post-traumatic headaches---resolved       Plan:  1. Maintain dose propranolol for headache and mood---this has been effective  2. wrote wean for exelon. I'm not sure he needs this any more. We discussed ways to potentiate his memory.  3. Concerta for attention and memory.   second rx for next mos             We will continue the controlled substance monitoring program, this consists of regular clinic visits, examinations, routine drug screening, pill counts as well as use of [place] Controlled Substance Reporting System. [place] was [place] today.                -second RF provided              -may call for RF in about 2 months 4. Continue keppra 500mg  bid 5. sleep hygiene improved 6. unable to drive secondary to right homonymous hemianopsia.      15 minutes of face to face patient care time were spent during this visit. All questions were encouraged and answered.  Follow up with me in 6 mos .

## 2023-03-28 NOTE — Patient Instructions (Addendum)
ALWAYS FEEL FREE TO CALL OUR OFFICE WITH ANY PROBLEMS OR QUESTIONS [phone])  **PLEASE NOTE** ALL MEDICATION REFILL REQUESTS (INCLUDING CONTROLLED SUBSTANCES) NEED TO BE MADE AT LEAST 7 DAYS PRIOR TO REFILL BEING DUE. ANY REFILL REQUESTS INSIDE THAT TIME FRAME MAY RESULT IN DELAYS IN RECEIVING YOUR PRESCRIPTION.    EXELON (MEDICINE FOR MEMORY): TRY TAKING ONCE PER DAY FOR ONE WEEK. IF NO PROBLEMS STOP THE MORNING DOSE AND SEE HOW THINGS GO.

## 2023-03-29 MED ORDER — METHYLPHENIDATE HCL ER (OSM) 36 MG PO TBCR: 36.0000 mg | EXTENDED_RELEASE_TABLET | Freq: Every day | ORAL | 0 refills | Status: DC

## 2023-03-29 NOTE — Telephone Encounter (Signed)
I re-sent to [facility]  There are probably 5 pharmacies in his list. This needs to be cleaned up, as I doubt he is using all of these. Also not sure why the concerta was set to HT.

## 2023-04-03 ENCOUNTER — Telehealth: Payer: Self-pay | Admitting: *Deleted

## 2023-04-03 NOTE — Telephone Encounter (Signed)
  Methylphenidate ER 36 mg had recall infor scanned into chart.

## 2023-04-16 ENCOUNTER — Encounter: Payer: Self-pay | Admitting: Physical Medicine & Rehabilitation

## 2023-05-24 ENCOUNTER — Other Ambulatory Visit: Payer: Self-pay | Admitting: Physical Medicine & Rehabilitation

## 2023-05-24 DIAGNOSIS — S066X1S Traumatic subarachnoid hemorrhage with loss of consciousness of 30 minutes or less, sequela: Secondary | ICD-10-CM

## 2023-05-24 MED ORDER — METHYLPHENIDATE HCL ER (OSM) 36 MG PO TBCR
36.0000 mg | EXTENDED_RELEASE_TABLET | Freq: Every day | ORAL | 0 refills | Status: DC
Start: 2023-05-24 — End: 2023-07-31

## 2023-05-24 MED ORDER — METHYLPHENIDATE HCL ER (OSM) 36 MG PO TBCR: 36.0000 mg | EXTENDED_RELEASE_TABLET | Freq: Every day | ORAL | 0 refills | Status: DC

## 2023-05-24 NOTE — Telephone Encounter (Signed)
Rxs sent

## 2023-05-24 NOTE — Telephone Encounter (Signed)
Mr. Albert [name] will be attending a Brain Injury Retreat this week. He would like the Methylphenidate refill sent in, so he will not run out. Because he will be leaving town soon after he returns.   Thank you

## 2023-06-06 ENCOUNTER — Encounter: Payer: Self-pay | Admitting: Psychology

## 2023-06-06 ENCOUNTER — Encounter
Payer: No Typology Code available for payment source | Attending: Physical Medicine & Rehabilitation | Admitting: Psychology

## 2023-06-06 DIAGNOSIS — H53461 Homonymous bilateral field defects, right side: Secondary | ICD-10-CM | POA: Diagnosis not present

## 2023-06-06 DIAGNOSIS — S069X0S Unspecified intracranial injury without loss of consciousness, sequela: Secondary | ICD-10-CM | POA: Diagnosis not present

## 2023-06-06 DIAGNOSIS — F068 Other specified mental disorders due to known physiological condition: Secondary | ICD-10-CM | POA: Diagnosis not present

## 2023-06-06 DIAGNOSIS — S066X1S Traumatic subarachnoid hemorrhage with loss of consciousness of 30 minutes or less, sequela: Secondary | ICD-10-CM | POA: Diagnosis not present

## 2023-06-06 DIAGNOSIS — F0630 Mood disorder due to known physiological condition, unspecified: Secondary | ICD-10-CM | POA: Insufficient documentation

## 2023-06-06 DIAGNOSIS — S069XAS Unspecified intracranial injury with loss of consciousness status unknown, sequela: Secondary | ICD-10-CM | POA: Insufficient documentation
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
## 2023-06-21 ENCOUNTER — Other Ambulatory Visit: Payer: Self-pay | Admitting: Physical Medicine & Rehabilitation

## 2023-07-04 ENCOUNTER — Encounter
Payer: No Typology Code available for payment source | Attending: Physical Medicine & Rehabilitation | Admitting: Psychology

## 2023-07-04 DIAGNOSIS — S069X0S Unspecified intracranial injury without loss of consciousness, sequela: Secondary | ICD-10-CM | POA: Diagnosis not present

## 2023-07-04 DIAGNOSIS — R4189 Other symptoms and signs involving cognitive functions and awareness: Secondary | ICD-10-CM | POA: Diagnosis not present

## 2023-07-04 DIAGNOSIS — S069XAS Unspecified intracranial injury with loss of consciousness status unknown, sequela: Secondary | ICD-10-CM | POA: Insufficient documentation

## 2023-07-04 DIAGNOSIS — H53461 Homonymous bilateral field defects, right side: Secondary | ICD-10-CM | POA: Insufficient documentation

## 2023-07-04 DIAGNOSIS — S066X1S Traumatic subarachnoid hemorrhage with loss of consciousness of 30 minutes or less, sequela: Secondary | ICD-10-CM | POA: Insufficient documentation

## 2023-07-04 DIAGNOSIS — F0630 Mood disorder due to known physiological condition, unspecified: Secondary | ICD-10-CM | POA: Diagnosis not present

## 2023-07-04 DIAGNOSIS — F068 Other specified mental disorders due to known physiological condition: Secondary | ICD-10-CM | POA: Insufficient documentation

## 2023-07-15 NOTE — Progress Notes (Signed)
Pt completed appt w/PCP about 3 weeks ago BP is being managed by meds  -Informational handouts given about Hypertension and ways to manage BP

## 2023-07-30 ENCOUNTER — Other Ambulatory Visit: Payer: Self-pay | Admitting: Physical Medicine & Rehabilitation

## 2023-07-30 ENCOUNTER — Telehealth: Payer: Self-pay | Admitting: *Deleted

## 2023-07-30 DIAGNOSIS — S066X1S Traumatic subarachnoid hemorrhage with loss of consciousness of 30 minutes or less, sequela: Secondary | ICD-10-CM

## 2023-07-30 NOTE — Telephone Encounter (Signed)
Albert [name] is calling for refill on his methylphenidate.

## 2023-07-31 MED ORDER — METHYLPHENIDATE HCL ER (OSM) 36 MG PO TBCR
36.0000 mg | EXTENDED_RELEASE_TABLET | Freq: Every day | ORAL | 0 refills | Status: DC
Start: 2023-07-31 — End: 2024-02-21

## 2023-07-31 MED ORDER — METHYLPHENIDATE HCL ER (OSM) 36 MG PO TBCR
36.0000 mg | EXTENDED_RELEASE_TABLET | Freq: Every day | ORAL | 0 refills | Status: DC
Start: 2023-07-31 — End: 2023-09-20

## 2023-07-31 NOTE — Telephone Encounter (Signed)
I filled both at [facility] where we've filled before. The pharmacy was set to [employer]

## 2023-08-01 ENCOUNTER — Encounter
Payer: No Typology Code available for payment source | Attending: Physical Medicine & Rehabilitation | Admitting: Psychology

## 2023-08-01 ENCOUNTER — Encounter: Payer: Self-pay | Admitting: Psychology

## 2023-08-01 DIAGNOSIS — S069XAS Unspecified intracranial injury with loss of consciousness status unknown, sequela: Secondary | ICD-10-CM | POA: Diagnosis not present

## 2023-08-01 DIAGNOSIS — F0630 Mood disorder due to known physiological condition, unspecified: Secondary | ICD-10-CM | POA: Diagnosis not present

## 2023-08-01 DIAGNOSIS — R4189 Other symptoms and signs involving cognitive functions and awareness: Secondary | ICD-10-CM | POA: Insufficient documentation

## 2023-08-01 DIAGNOSIS — S066X1S Traumatic subarachnoid hemorrhage with loss of consciousness of 30 minutes or less, sequela: Secondary | ICD-10-CM | POA: Insufficient documentation
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
## 2023-08-10 ENCOUNTER — Encounter: Payer: Self-pay | Admitting: Psychology
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
## 2023-08-15 ENCOUNTER — Encounter: Payer: Self-pay | Admitting: *Deleted

## 2023-08-15 NOTE — Progress Notes (Addendum)
Pt attended 07/15/23 screening event where his lowest b/p was 142/97. At the event, the pt shared that his dr is Dr. Riley Kill and he did not identify any SDOH insecurities. During the initial event f/u, pt shared that some of he medicine he is taking for his TBI, per Dr. Riley Kill, can make his b/p go up "and it really changes depending on when I take the medicine but I also check with my own cuff at home and it has been running between 125-130/ 70/80 most of the time." Pt states he cannot remember when he last saw Dr. Duanne Guess but it was "just a couple of months ago- it is on my calendar,.and she keeps in touch with Dr. Riley Kill"  Pt further clarified "I told Dr. Riley Kill about my b/p from the event and he just told me to keep checking at home and let him know, since he knows what medicines affect what and he and Dr. Duanne Guess are keeping in touch and taking care of me." - Chart review indicates pt last saw Dr. Riley Kill for an office visit on 03/28/23. Pt keeps in touch with him via MyChart messages also. In addition, pt sees Dr. Kieth Brightly each month for counseling. Dr Chauncy Passy encounters/notes  are not visible in [facility] to confirm dates. No additional health equity support indicated at this time. 08/16/23 Dr. Chauncy Passy office called back and was given HTN info from event and pt f/u info. Office states pt seen 07/06/23 and has upcoming appt 1/25 and is being monitored for HTN.

## 2023-08-19 ENCOUNTER — Other Ambulatory Visit: Payer: Self-pay | Admitting: Physical Medicine & Rehabilitation

## 2023-08-29 ENCOUNTER — Encounter
Payer: No Typology Code available for payment source | Attending: Physical Medicine & Rehabilitation | Admitting: Psychology

## 2023-08-29 DIAGNOSIS — S069XAS Unspecified intracranial injury with loss of consciousness status unknown, sequela: Secondary | ICD-10-CM | POA: Diagnosis not present

## 2023-08-29 DIAGNOSIS — F0630 Mood disorder due to known physiological condition, unspecified: Secondary | ICD-10-CM | POA: Insufficient documentation

## 2023-08-29 DIAGNOSIS — S066X1S Traumatic subarachnoid hemorrhage with loss of consciousness of 30 minutes or less, sequela: Secondary | ICD-10-CM | POA: Insufficient documentation

## 2023-08-29 DIAGNOSIS — H53461 Homonymous bilateral field defects, right side: Secondary | ICD-10-CM | POA: Insufficient documentation

## 2023-08-29 DIAGNOSIS — R4189 Other symptoms and signs involving cognitive functions and awareness: Secondary | ICD-10-CM | POA: Insufficient documentation

## 2023-09-06 ENCOUNTER — Encounter: Payer: Self-pay | Admitting: Psychology
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
## 2023-09-20 ENCOUNTER — Other Ambulatory Visit: Payer: Self-pay

## 2023-09-20 DIAGNOSIS — S066X1S Traumatic subarachnoid hemorrhage with loss of consciousness of 30 minutes or less, sequela: Secondary | ICD-10-CM

## 2023-09-20 NOTE — Telephone Encounter (Signed)
 Please send Rx for Methylphenidate  to [facility] on [address]. Patient will be traveling out [place] next [place]. Per patient it will be no problem for early pick up.   PMP: Filled  Written  ID  Drug  QTY  Days  Prescriber  RX #  Dispenser  Refill  Daily Dose*  Pymt Type  PMP  08/28/2023 07/31/2023 2  Methylphenidate  Er 36 Mg Tab 30.00 30 Za Swa [id number] [facility] [phone]) 0/0  [employer] 07/31/2023 07/31/2023 2  Methylphenidate  Er 36 Mg Tab 30.00 30 Za Swa [id number] [facility] [phone]) 0/0

## 2023-09-21 MED ORDER — METHYLPHENIDATE HCL ER (OSM) 36 MG PO TBCR
36.0000 mg | EXTENDED_RELEASE_TABLET | Freq: Every day | ORAL | 0 refills | Status: DC
Start: 2023-09-21 — End: 2024-09-20

## 2023-09-22 ENCOUNTER — Other Ambulatory Visit: Payer: Self-pay | Admitting: Physical Medicine & Rehabilitation

## 2023-09-22 DIAGNOSIS — S066X1S Traumatic subarachnoid hemorrhage with loss of consciousness of 30 minutes or less, sequela: Secondary | ICD-10-CM

## 2023-09-24 MED ORDER — METHYLPHENIDATE HCL ER (OSM) 36 MG PO TBCR
36.0000 mg | EXTENDED_RELEASE_TABLET | Freq: Every day | ORAL | 0 refills | Status: DC
Start: 2023-09-24 — End: 2023-10-31

## 2023-09-26 ENCOUNTER — Ambulatory Visit: Payer: Medicare Other | Admitting: Physical Medicine & Rehabilitation

## 2023-09-30 ENCOUNTER — Other Ambulatory Visit: Payer: Self-pay | Admitting: Medical Genetics

## 2023-10-25 ENCOUNTER — Encounter
Payer: No Typology Code available for payment source | Attending: Physical Medicine & Rehabilitation | Admitting: Psychology

## 2023-10-25 DIAGNOSIS — S065X9S Traumatic subdural hemorrhage with loss of consciousness of unspecified duration, sequela: Secondary | ICD-10-CM | POA: Diagnosis present

## 2023-10-25 DIAGNOSIS — F0630 Mood disorder due to known physiological condition, unspecified: Secondary | ICD-10-CM

## 2023-10-25 DIAGNOSIS — R4189 Other symptoms and signs involving cognitive functions and awareness: Secondary | ICD-10-CM | POA: Diagnosis present

## 2023-10-25 DIAGNOSIS — S066X1S Traumatic subarachnoid hemorrhage with loss of consciousness of 30 minutes or less, sequela: Secondary | ICD-10-CM | POA: Insufficient documentation

## 2023-10-25 DIAGNOSIS — S069XAS Unspecified intracranial injury with loss of consciousness status unknown, sequela: Secondary | ICD-10-CM | POA: Insufficient documentation

## 2023-10-25 DIAGNOSIS — H53461 Homonymous bilateral field defects, right side: Secondary | ICD-10-CM

## 2023-10-30 ENCOUNTER — Other Ambulatory Visit (HOSPITAL_COMMUNITY): Payer: Self-pay

## 2023-10-31 ENCOUNTER — Encounter (HOSPITAL_BASED_OUTPATIENT_CLINIC_OR_DEPARTMENT_OTHER): Payer: No Typology Code available for payment source | Admitting: Physical Medicine & Rehabilitation

## 2023-10-31 ENCOUNTER — Encounter: Payer: Self-pay | Admitting: Physical Medicine & Rehabilitation

## 2023-10-31 VITALS — BP 137/83 | HR 66 | Ht 72.0 in | Wt 216.0 lb

## 2023-10-31 DIAGNOSIS — S066X1S Traumatic subarachnoid hemorrhage with loss of consciousness of 30 minutes or less, sequela: Secondary | ICD-10-CM | POA: Diagnosis not present

## 2023-10-31 DIAGNOSIS — R4189 Other symptoms and signs involving cognitive functions and awareness: Secondary | ICD-10-CM | POA: Diagnosis not present

## 2023-10-31 DIAGNOSIS — S065X9S Traumatic subdural hemorrhage with loss of consciousness of unspecified duration, sequela: Secondary | ICD-10-CM

## 2023-10-31 DIAGNOSIS — S069XAS Unspecified intracranial injury with loss of consciousness status unknown, sequela: Secondary | ICD-10-CM | POA: Diagnosis not present

## 2023-10-31 MED ORDER — METHYLPHENIDATE HCL ER (OSM) 36 MG PO TBCR: 36.0000 mg | EXTENDED_RELEASE_TABLET | Freq: Every day | ORAL | 0 refills | Status: DC

## 2023-10-31 NOTE — Patient Instructions (Addendum)
 ALWAYS FEEL FREE TO CALL OUR OFFICE WITH ANY PROBLEMS OR QUESTIONS [phone])  **PLEASE NOTE** ALL MEDICATION REFILL REQUESTS (INCLUDING CONTROLLED SUBSTANCES) NEED TO BE MADE AT LEAST 7 DAYS PRIOR TO REFILL BEING DUE. ANY REFILL REQUESTS INSIDE THAT TIME FRAME MAY RESULT IN DELAYS IN RECEIVING YOUR PRESCRIPTION.     ?HAVE YOU STOPPED EXELON (RIVASTIGMINE) ?  MEMORY MEDICINE   IF YOU WAKE UP AT NIGHT, PUT ON SOME MUSIC OR RELAXING SOUNDS ON YOUR EAR BUDS. MAYBE AN OLD VIDEO OR BOOK.

## 2023-10-31 NOTE — Progress Notes (Signed)
 Subjective:    Patient ID: Albert [name], male    DOB: [birth year], 48 y.o.   MRN: [id number]  HPI Albert [name] is here in follow up of his TBI. 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 He likes working there.  He likes being around people in the work helps with his memory.  He is able to function in his job by taking his time and taking notes.  Things to be going fairly well at home.  He is getting along with his kids and wife.  He asked me about bring in his oldest son to the injury support group meeting next month as he had some questions about his injury.  He is sleeping well for the most part. He wakes up sometimes and has difficulties falling back asleep. He does use background noise to help.   He hasn't stopped the exelon yet. He forgot to taper.  He remains on Concerta 36 mg in the morning.  He always tries to take it by 7 AM each day.  He does not feel that it affects his falling asleep at night most of the time.  Pain Inventory Average Pain  rare headaches usually a 9 Pain Right Now 0 My pain is intermittent and sharp  LOCATION OF PAIN  headache  BOWEL Number of stools per week: 10  BLADDER Normal    Mobility walk without assistance ability to climb steps?  yes do you drive?  no Do you have any goals in this area?  yes  Function disabled: date disabled 2014 Do you have any goals in this area?  yes  Neuro/Psych weakness numbness tingling  Prior Studies Any changes since last visit?  no  Physicians involved in your care Any changes since last visit?  no   Family History  Problem Relation Age of Onset   Heart disease Father    Social History   Socioeconomic History   Marital status: Married    Spouse name: Not on file   Number of children: Not on file   Years of education: Not on file   Highest education level: Not on file  Occupational History   Not on file  Tobacco Use   Smoking status: Former    Current packs/day: 0.00    Types:  Cigarettes    Quit date: 12/17/1999    Years since quitting: 23.8    Passive exposure: Past   Smokeless tobacco: Never  Vaping Use   Vaping status: Never Used  Substance and Sexual Activity   Alcohol use: Yes   Drug use: No   Sexual activity: Not on file  Other Topics Concern   Not on file  Social History Narrative   Not on file   Social Drivers of Health   Financial Resource Strain: Not on file  Food Insecurity: No Food Insecurity (07/15/2023)   Hunger Vital Sign    Worried About Running Out of Food in the Last Year: Never true    Ran Out of Food in the Last Year: Never true  Transportation Needs: No Transportation Needs (07/15/2023)   PRAPARE - Administrator, Civil Service (Medical): No    Lack of Transportation (Non-Medical): No  Physical Activity: Not on file  Stress: Not on file  Social Connections: Not on file   Past Surgical History:  Procedure Laterality Date   CRANIOPLASTY N/A 06/10/2013   Procedure: CRANIOPLASTY;  Surgeon: Karn Cassis, MD;  Location: [facility];  Service: Neurosurgery;  Laterality: N/A;  CRANIOPLASTY   CRANIOTOMY Left 12/15/2012   Procedure: CRANIECTOMY HEMATOMA EVACUATION SUBDURAL WITH PLACEMENT OF BONE FLAP IN ABDOMINAL WALL;  Surgeon: Karn Cassis, MD;  Location: [facility];  Service: Neurosurgery;  Laterality: Left;   CRANIOTOMY Left 12/28/2012   Procedure: CRANIOTOMY HEMATOMA EVACUATION SUBDURAL;  Surgeon: Maeola Harman, MD;  Location: [facility];  Service: Neurosurgery;  Laterality: Left;   PEG PLACEMENT N/A 12/31/2012   Procedure: PERCUTANEOUS ENDOSCOPIC GASTROSTOMY (PEG) PLACEMENT;  Surgeon: Liz Malady, MD;  Location: [facility] ENDOSCOPY;  Service: General;  Laterality: N/A;  bedside  peg   PERCUTANEOUS TRACHEOSTOMY N/A 12/31/2012   Procedure: PERCUTANEOUS TRACHEOSTOMY (BEDSIDE);  Surgeon: Liz Malady, MD;  [facility] OR;  Service: General;  Laterality: N/A;   WRIST SURGERY Right 2008   Past Medical History:   Diagnosis Date   Headache(784.0)    Rotator cuff injury    right   Seizures (HCC)    12/28/2012   Stroke (HCC)    some aphasia , weakness in the R arm , Out pt. rehab currently    Swallowing problem    still relearning controlled swallowing - rec'ing speech therapy in rehab   Traumatic brain injury (HCC)    BP 137/83   Pulse 66   Ht 6' (1.829 m)   Wt 216 lb (98 kg)   SpO2 97%   BMI 29.29 kg/m   Opioid Risk Score:   Fall Risk Score:  `1  Depression screen [facility] 2/9     10/31/2023    9:51 AM 03/28/2023   10:21 AM 09/27/2022    3:17 PM 07/19/2022    8:35 AM 01/11/2022    8:43 AM 07/13/2021    9:37 AM 01/05/2021   10:03 AM  Depression screen PHQ 2/9  Decreased Interest 0 0 0 0 0 0 0  Down, Depressed, Hopeless 0 0 0 0 0 0 0  PHQ - 2 Score 0 0 0 0 0 0 0    Review of Systems  Musculoskeletal:        Right foot - tingling, numbness & weakness  All other systems reviewed and are negative.      Objective:   Physical Exam General: No acute distress HEENT: NCAT, EOMI, oral membranes moist Cards: reg rate  Chest: normal effort Abdomen: Soft, NT, ND Skin: dry, intact Extremities: no edema Psych: pleasant and appropriate   Neuro: fairly focused attention. Still with significant memory deficits as a result of his atteniton problems.  functional insight and awareness. Better functional memory.  Right HH stable  Musculoskeletal:  normal gait.          Assessment:  1. 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 Status post craniotomy evacuation of hematoma. Persistent ongoing cognitive deficits as well as right visual field loss---compensates fairly nice 2. Old right shoulder injury, likely RTC tendonitis- generally resolved  3. Seizure post- traumatic  4. Scrotal swelling, pain----resolved 5. Post-traumatic headaches---resolved       Plan:  1. Maintain dose propranolol for headache and mood---this has been effective 2. we had  discussed a wean of exelon.  3. Concerta for attention and memory.               We will continue the controlled substance monitoring program, this consists of regular clinic visits, examinations, routine drug screening, pill counts as well as use of [place] Controlled Substance Reporting System. [place] today.  Marland Kitchen                -  second RF not needed today              -may call for RF in about 2 months 4. Continue keppra 500mg  bid 5. sleep hygiene--discussed background noise, music, read a book, relaxation apps 6. unable to drive secondary to right homonymous hemianopsia.      20 minutes of face to face patient care time were spent during this visit. All questions were encouraged and answered.  Follow up with me in 6 mos .

## 2023-12-04 ENCOUNTER — Ambulatory Visit: Payer: Medicare Other | Admitting: Psychology

## 2023-12-17 ENCOUNTER — Other Ambulatory Visit: Payer: Self-pay | Admitting: Physical Medicine & Rehabilitation

## 2023-12-17 DIAGNOSIS — S066X1S Traumatic subarachnoid hemorrhage with loss of consciousness of 30 minutes or less, sequela: Secondary | ICD-10-CM

## 2023-12-18 MED ORDER — METHYLPHENIDATE HCL ER (OSM) 36 MG PO TBCR: 36.0000 mg | EXTENDED_RELEASE_TABLET | Freq: Every day | ORAL | 0 refills | Status: DC

## 2023-12-18 NOTE — Telephone Encounter (Signed)
 Please fill this week as patient will be out of town next week. Thanks!

## 2023-12-26 ENCOUNTER — Encounter: Payer: Self-pay | Admitting: Psychology
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
## 2024-01-02 ENCOUNTER — Encounter: Payer: Medicare Other | Attending: Physical Medicine & Rehabilitation | Admitting: Psychology

## 2024-01-02 DIAGNOSIS — R4189 Other symptoms and signs involving cognitive functions and awareness: Secondary | ICD-10-CM | POA: Diagnosis present

## 2024-01-02 DIAGNOSIS — S065X9S Traumatic subdural hemorrhage with loss of consciousness of unspecified duration, sequela: Secondary | ICD-10-CM | POA: Diagnosis present

## 2024-01-02 DIAGNOSIS — S069XAS Unspecified intracranial injury with loss of consciousness status unknown, sequela: Secondary | ICD-10-CM | POA: Insufficient documentation

## 2024-01-02 DIAGNOSIS — F0630 Mood disorder due to known physiological condition, unspecified: Secondary | ICD-10-CM | POA: Diagnosis not present

## 2024-01-02 DIAGNOSIS — H53461 Homonymous bilateral field defects, right side: Secondary | ICD-10-CM | POA: Diagnosis not present

## 2024-01-02 DIAGNOSIS — S066X1S Traumatic subarachnoid hemorrhage with loss of consciousness of 30 minutes or less, sequela: Secondary | ICD-10-CM | POA: Diagnosis present

## 2024-01-16 ENCOUNTER — Encounter: Payer: Self-pay | Admitting: Psychology
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
## 2024-01-17 ENCOUNTER — Other Ambulatory Visit: Payer: Self-pay | Admitting: Physical Medicine & Rehabilitation

## 2024-01-17 DIAGNOSIS — S066X1S Traumatic subarachnoid hemorrhage with loss of consciousness of 30 minutes or less, sequela: Secondary | ICD-10-CM

## 2024-01-18 MED ORDER — METHYLPHENIDATE HCL ER (OSM) 36 MG PO TBCR: 36.0000 mg | EXTENDED_RELEASE_TABLET | Freq: Every day | ORAL | 0 refills | Status: DC

## 2024-01-29 ENCOUNTER — Encounter: Payer: Medicare Other | Attending: Physical Medicine & Rehabilitation | Admitting: Psychology

## 2024-01-29 DIAGNOSIS — H53461 Homonymous bilateral field defects, right side: Secondary | ICD-10-CM | POA: Diagnosis not present

## 2024-01-29 DIAGNOSIS — S066X1D Traumatic subarachnoid hemorrhage with loss of consciousness of 30 minutes or less, subsequent encounter: Secondary | ICD-10-CM | POA: Diagnosis not present

## 2024-01-29 DIAGNOSIS — F0630 Mood disorder due to known physiological condition, unspecified: Secondary | ICD-10-CM | POA: Insufficient documentation

## 2024-01-29 DIAGNOSIS — S066X1S Traumatic subarachnoid hemorrhage with loss of consciousness of 30 minutes or less, sequela: Secondary | ICD-10-CM | POA: Diagnosis not present

## 2024-01-29 DIAGNOSIS — R4189 Other symptoms and signs involving cognitive functions and awareness: Secondary | ICD-10-CM | POA: Insufficient documentation

## 2024-01-29 DIAGNOSIS — S069XAS Unspecified intracranial injury with loss of consciousness status unknown, sequela: Secondary | ICD-10-CM

## 2024-01-29 DIAGNOSIS — S069X0D Unspecified intracranial injury without loss of consciousness, subsequent encounter: Secondary | ICD-10-CM | POA: Insufficient documentation

## 2024-01-31 ENCOUNTER — Encounter: Payer: Self-pay | Admitting: Psychology
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
## 2024-02-14 ENCOUNTER — Encounter: Payer: Self-pay | Admitting: Physical Medicine & Rehabilitation

## 2024-02-14 ENCOUNTER — Other Ambulatory Visit: Payer: Self-pay | Admitting: Physical Medicine & Rehabilitation

## 2024-02-14 DIAGNOSIS — F0630 Mood disorder due to known physiological condition, unspecified: Secondary | ICD-10-CM

## 2024-02-14 DIAGNOSIS — S066X1S Traumatic subarachnoid hemorrhage with loss of consciousness of 30 minutes or less, sequela: Secondary | ICD-10-CM

## 2024-02-21 ENCOUNTER — Telehealth: Payer: Self-pay

## 2024-02-21 DIAGNOSIS — S066X1S Traumatic subarachnoid hemorrhage with loss of consciousness of 30 minutes or less, sequela: Secondary | ICD-10-CM

## 2024-02-21 MED ORDER — METHYLPHENIDATE HCL ER (OSM) 36 MG PO TBCR: 36.0000 mg | EXTENDED_RELEASE_TABLET | Freq: Every day | ORAL | 0 refills | Status: DC

## 2024-02-21 MED ORDER — METHYLPHENIDATE HCL ER (OSM) 36 MG PO TBCR
36.0000 mg | EXTENDED_RELEASE_TABLET | Freq: Every day | ORAL | 0 refills | Status: DC
Start: 2024-02-21 — End: 2024-03-19

## 2024-02-21 NOTE — Telephone Encounter (Signed)
 Pt needs a refill on methylphenidate  36 mg. He runs out in a week. That same week his spouse will be out of town. He wants a refill early, Please advise.

## 2024-02-21 NOTE — Telephone Encounter (Signed)
 Requested pharmacy to allow pick up on 02/22/24. Rx also written for next month

## 2024-02-21 NOTE — Telephone Encounter (Signed)
 Jolena Nay the Pharmacist at [facility] called &  stated:    Tito Ausmus may pick up his Methylphenidate  tomorrow. Jolena Nay said Wilmer Hash has a one day only, early release for controlled medications.

## 2024-03-17 ENCOUNTER — Telehealth: Payer: Self-pay | Admitting: Physical Medicine & Rehabilitation

## 2024-03-17 DIAGNOSIS — S066X1S Traumatic subarachnoid hemorrhage with loss of consciousness of 30 minutes or less, sequela: Secondary | ICD-10-CM

## 2024-03-17 NOTE — Telephone Encounter (Signed)
 Pt called requesting med refill on methylphenidate  (CONCERTA ) 36 MG PO CR tablet  states he will need an early refill because he's going out of town

## 2024-03-18 NOTE — Telephone Encounter (Signed)
 He has an rx on file with DNF before date of 7/10.  That won't work ?

## 2024-03-19 MED ORDER — METHYLPHENIDATE HCL ER (OSM) 36 MG PO TBCR: 36.0000 mg | EXTENDED_RELEASE_TABLET | Freq: Every day | ORAL | 0 refills | Status: DC

## 2024-03-19 NOTE — Telephone Encounter (Signed)
 New rx sent

## 2024-03-19 NOTE — Telephone Encounter (Signed)
 I'm unclear as to why this was necessary as I already had an rx with a DNFB date of 7/10 sitting in pharmacy. Either way it's done.

## 2024-03-19 NOTE — Telephone Encounter (Signed)
 Patient calling to follow up on rx refill req for methylphenidate  (Concerta ) 36mg  tablet. The pharmacy told him that [facility] needs to put a note on the rx specifying that he can refill the med early before he goes out of town. He says he is leaving on Saturday so he needs to refill the prescription as soon as possible.

## 2024-03-19 NOTE — Telephone Encounter (Signed)
 Pt called back he called the pharmacy and they do not have an Rx on file

## 2024-03-20 NOTE — Telephone Encounter (Signed)
 Spoke to [place] and he just wanted to know when his wife can pick up the medications, she is not going out of town with him so she can pick up the prescription on time.  Spoke to [place], Manufacturing systems engineer and he can have the prescription ready for pick up on 7/15. [name] is aware.

## 2024-03-21 ENCOUNTER — Telehealth: Payer: Self-pay | Admitting: Physical Medicine & Rehabilitation

## 2024-03-21 NOTE — Telephone Encounter (Signed)
 P will be out of town Tuesday when his refill is due

## 2024-03-21 NOTE — Telephone Encounter (Signed)
 P called again and said he will be out of town when refill is due Tuesday he would like it sent to [facility] [place]  phone number [phone]

## 2024-03-26 ENCOUNTER — Encounter: Payer: Medicare Other | Admitting: Psychology

## 2024-04-23 ENCOUNTER — Encounter: Payer: Self-pay | Admitting: Physical Medicine & Rehabilitation

## 2024-04-23 ENCOUNTER — Encounter: Payer: Medicare Other | Attending: Physical Medicine & Rehabilitation | Admitting: Physical Medicine & Rehabilitation

## 2024-04-23 VITALS — BP 127/82 | HR 70 | Ht 72.0 in | Wt 217.4 lb

## 2024-04-23 DIAGNOSIS — S069XAS Unspecified intracranial injury with loss of consciousness status unknown, sequela: Secondary | ICD-10-CM | POA: Diagnosis present

## 2024-04-23 DIAGNOSIS — R4189 Other symptoms and signs involving cognitive functions and awareness: Secondary | ICD-10-CM | POA: Diagnosis present

## 2024-04-23 DIAGNOSIS — F0630 Mood disorder due to known physiological condition, unspecified: Secondary | ICD-10-CM | POA: Insufficient documentation

## 2024-04-23 DIAGNOSIS — S066X1S Traumatic subarachnoid hemorrhage with loss of consciousness of 30 minutes or less, sequela: Secondary | ICD-10-CM | POA: Insufficient documentation

## 2024-04-23 DIAGNOSIS — H53461 Homonymous bilateral field defects, right side: Secondary | ICD-10-CM | POA: Insufficient documentation

## 2024-04-23 DIAGNOSIS — S066X1D Traumatic subarachnoid hemorrhage with loss of consciousness of 30 minutes or less, subsequent encounter: Secondary | ICD-10-CM | POA: Insufficient documentation

## 2024-04-23 MED ORDER — METHYLPHENIDATE HCL ER (OSM) 36 MG PO TBCR: 36.0000 mg | EXTENDED_RELEASE_TABLET | Freq: Every day | ORAL | 0 refills | Status: DC
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
## 2024-04-23 NOTE — Progress Notes (Signed)
   Subjective:    Patient ID: Albert [name], male    DOB: Feb 15, 1976, 48 y.o.   MRN: [id number]  HPI    Review of Systems     Objective:   Physical Exam        Assessment & Plan:

## 2024-04-23 NOTE — Patient Instructions (Signed)
 ALWAYS FEEL FREE TO CALL OUR OFFICE WITH ANY PROBLEMS OR QUESTIONS [phone])  **PLEASE NOTE** ALL MEDICATION REFILL REQUESTS (INCLUDING CONTROLLED SUBSTANCES) NEED TO BE MADE AT LEAST 7 DAYS PRIOR TO REFILL BEING DUE. ANY REFILL REQUESTS INSIDE THAT TIME FRAME MAY RESULT IN DELAYS IN RECEIVING YOUR PRESCRIPTION.

## 2024-04-30 ENCOUNTER — Encounter (HOSPITAL_BASED_OUTPATIENT_CLINIC_OR_DEPARTMENT_OTHER): Payer: Medicare Other | Admitting: Psychology

## 2024-04-30 ENCOUNTER — Encounter: Payer: Self-pay | Admitting: Psychology

## 2024-04-30 DIAGNOSIS — R4189 Other symptoms and signs involving cognitive functions and awareness: Secondary | ICD-10-CM

## 2024-04-30 DIAGNOSIS — H53461 Homonymous bilateral field defects, right side: Secondary | ICD-10-CM | POA: Diagnosis not present

## 2024-04-30 DIAGNOSIS — F0630 Mood disorder due to known physiological condition, unspecified: Secondary | ICD-10-CM

## 2024-04-30 DIAGNOSIS — S066X1D Traumatic subarachnoid hemorrhage with loss of consciousness of 30 minutes or less, subsequent encounter: Secondary | ICD-10-CM | POA: Diagnosis not present

## 2024-04-30 DIAGNOSIS — S066X1S Traumatic subarachnoid hemorrhage with loss of consciousness of 30 minutes or less, sequela: Secondary | ICD-10-CM
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
## 2024-05-09 ENCOUNTER — Encounter: Payer: Self-pay | Admitting: Physical Medicine & Rehabilitation

## 2024-06-04 ENCOUNTER — Encounter: Payer: Medicare Other | Attending: Physical Medicine & Rehabilitation | Admitting: Psychology

## 2024-06-04 ENCOUNTER — Encounter: Payer: Self-pay | Admitting: Psychology

## 2024-06-04 DIAGNOSIS — S069XAS Unspecified intracranial injury with loss of consciousness status unknown, sequela: Secondary | ICD-10-CM | POA: Insufficient documentation

## 2024-06-04 DIAGNOSIS — H53461 Homonymous bilateral field defects, right side: Secondary | ICD-10-CM | POA: Diagnosis not present

## 2024-06-04 DIAGNOSIS — S066X1S Traumatic subarachnoid hemorrhage with loss of consciousness of 30 minutes or less, sequela: Secondary | ICD-10-CM | POA: Insufficient documentation

## 2024-06-04 DIAGNOSIS — R4189 Other symptoms and signs involving cognitive functions and awareness: Secondary | ICD-10-CM | POA: Diagnosis present

## 2024-06-04 DIAGNOSIS — F0630 Mood disorder due to known physiological condition, unspecified: Secondary | ICD-10-CM | POA: Diagnosis present
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
## 2024-06-24 ENCOUNTER — Other Ambulatory Visit: Payer: Self-pay

## 2024-06-24 ENCOUNTER — Other Ambulatory Visit (HOSPITAL_BASED_OUTPATIENT_CLINIC_OR_DEPARTMENT_OTHER): Payer: Self-pay

## 2024-06-24 ENCOUNTER — Telehealth: Payer: Self-pay | Admitting: Physical Medicine & Rehabilitation

## 2024-06-24 DIAGNOSIS — S066X1S Traumatic subarachnoid hemorrhage with loss of consciousness of 30 minutes or less, sequela: Secondary | ICD-10-CM

## 2024-06-24 MED ORDER — METHYLPHENIDATE HCL ER (OSM) 36 MG PO TBCR
36.0000 mg | EXTENDED_RELEASE_TABLET | Freq: Every day | ORAL | 0 refills | Status: DC
  Filled 2024-06-24: qty 30, 30d supply, fill #0

## 2024-06-24 MED ORDER — METHYLPHENIDATE HCL ER (OSM) 36 MG PO TBCR
36.0000 mg | EXTENDED_RELEASE_TABLET | Freq: Every day | ORAL | 0 refills | Status: DC
  Filled 2024-06-24 – 2024-07-22 (×2): qty 30, 30d supply, fill #0

## 2024-06-24 NOTE — Telephone Encounter (Signed)
 Patient needs refill on methylphenidate  and he would like to have it filled at [facility] on [address].  He will be there today for an appointment.  Please call patient when ready.

## 2024-06-24 NOTE — Telephone Encounter (Signed)
 Filled for October and November.

## 2024-07-02 ENCOUNTER — Encounter: Payer: Medicare Other | Attending: Physical Medicine & Rehabilitation | Admitting: Psychology

## 2024-07-02 DIAGNOSIS — S066X1S Traumatic subarachnoid hemorrhage with loss of consciousness of 30 minutes or less, sequela: Secondary | ICD-10-CM | POA: Insufficient documentation

## 2024-07-02 DIAGNOSIS — S069XAS Unspecified intracranial injury with loss of consciousness status unknown, sequela: Secondary | ICD-10-CM | POA: Diagnosis present

## 2024-07-02 DIAGNOSIS — F0630 Mood disorder due to known physiological condition, unspecified: Secondary | ICD-10-CM | POA: Insufficient documentation

## 2024-07-02 DIAGNOSIS — R4189 Other symptoms and signs involving cognitive functions and awareness: Secondary | ICD-10-CM | POA: Insufficient documentation

## 2024-07-02 DIAGNOSIS — H53461 Homonymous bilateral field defects, right side: Secondary | ICD-10-CM | POA: Insufficient documentation
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
## 2024-07-13 ENCOUNTER — Other Ambulatory Visit: Payer: Self-pay | Admitting: Medical Genetics

## 2024-07-13 DIAGNOSIS — Z006 Encounter for examination for normal comparison and control in clinical research program: Secondary | ICD-10-CM

## 2024-07-16 ENCOUNTER — Other Ambulatory Visit: Payer: Self-pay | Admitting: Physical Medicine & Rehabilitation

## 2024-07-16 DIAGNOSIS — S066X1S Traumatic subarachnoid hemorrhage with loss of consciousness of 30 minutes or less, sequela: Secondary | ICD-10-CM

## 2024-07-17 ENCOUNTER — Other Ambulatory Visit (HOSPITAL_BASED_OUTPATIENT_CLINIC_OR_DEPARTMENT_OTHER): Payer: Self-pay

## 2024-07-21 ENCOUNTER — Other Ambulatory Visit (HOSPITAL_BASED_OUTPATIENT_CLINIC_OR_DEPARTMENT_OTHER): Payer: Self-pay

## 2024-07-21 MED ORDER — METHYLPHENIDATE HCL ER (OSM) 36 MG PO TBCR
36.0000 mg | EXTENDED_RELEASE_TABLET | Freq: Every day | ORAL | 0 refills | Status: DC
  Filled 2024-07-21 – 2024-08-19 (×2): qty 30, 30d supply, fill #0

## 2024-07-21 NOTE — Telephone Encounter (Signed)
 I requested an early fill

## 2024-07-22 ENCOUNTER — Other Ambulatory Visit (HOSPITAL_BASED_OUTPATIENT_CLINIC_OR_DEPARTMENT_OTHER): Payer: Self-pay

## 2024-08-04 LAB — GENECONNECT MOLECULAR SCREEN: Genetic Analysis Overall Interpretation: NEGATIVE

## 2024-08-06 ENCOUNTER — Encounter: Payer: Self-pay | Admitting: Psychology

## 2024-08-06 ENCOUNTER — Encounter: Payer: Medicare Other | Attending: Physical Medicine & Rehabilitation | Admitting: Psychology

## 2024-08-06 DIAGNOSIS — S069XAS Unspecified intracranial injury with loss of consciousness status unknown, sequela: Secondary | ICD-10-CM | POA: Insufficient documentation

## 2024-08-06 DIAGNOSIS — S066X1S Traumatic subarachnoid hemorrhage with loss of consciousness of 30 minutes or less, sequela: Secondary | ICD-10-CM | POA: Diagnosis present

## 2024-08-06 DIAGNOSIS — R4189 Other symptoms and signs involving cognitive functions and awareness: Secondary | ICD-10-CM | POA: Diagnosis present

## 2024-08-06 DIAGNOSIS — F0630 Mood disorder due to known physiological condition, unspecified: Secondary | ICD-10-CM | POA: Diagnosis present
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
## 2024-08-11 ENCOUNTER — Other Ambulatory Visit (HOSPITAL_BASED_OUTPATIENT_CLINIC_OR_DEPARTMENT_OTHER): Payer: Self-pay

## 2024-08-12 ENCOUNTER — Other Ambulatory Visit (HOSPITAL_BASED_OUTPATIENT_CLINIC_OR_DEPARTMENT_OTHER): Payer: Self-pay

## 2024-08-15 ENCOUNTER — Other Ambulatory Visit (HOSPITAL_COMMUNITY): Payer: Self-pay

## 2024-08-15 ENCOUNTER — Other Ambulatory Visit: Payer: Self-pay | Admitting: Physical Medicine & Rehabilitation

## 2024-08-19 ENCOUNTER — Other Ambulatory Visit (HOSPITAL_BASED_OUTPATIENT_CLINIC_OR_DEPARTMENT_OTHER): Payer: Self-pay

## 2024-08-20 ENCOUNTER — Other Ambulatory Visit (HOSPITAL_BASED_OUTPATIENT_CLINIC_OR_DEPARTMENT_OTHER): Payer: Self-pay

## 2024-08-28 ENCOUNTER — Encounter: Payer: Medicare Other | Attending: Physical Medicine & Rehabilitation | Admitting: Psychology

## 2024-09-05 ENCOUNTER — Other Ambulatory Visit: Payer: Self-pay | Admitting: Physical Medicine & Rehabilitation

## 2024-09-05 ENCOUNTER — Other Ambulatory Visit (HOSPITAL_BASED_OUTPATIENT_CLINIC_OR_DEPARTMENT_OTHER): Payer: Self-pay

## 2024-09-05 ENCOUNTER — Encounter: Payer: Self-pay | Admitting: Physical Medicine & Rehabilitation

## 2024-09-05 DIAGNOSIS — S066X1S Traumatic subarachnoid hemorrhage with loss of consciousness of 30 minutes or less, sequela: Secondary | ICD-10-CM

## 2024-09-15 DIAGNOSIS — S066X1S Traumatic subarachnoid hemorrhage with loss of consciousness of 30 minutes or less, sequela: Secondary | ICD-10-CM

## 2024-09-16 ENCOUNTER — Other Ambulatory Visit (HOSPITAL_BASED_OUTPATIENT_CLINIC_OR_DEPARTMENT_OTHER): Payer: Self-pay

## 2024-09-16 MED ORDER — METHYLPHENIDATE HCL ER (OSM) 36 MG PO TBCR
36.0000 mg | EXTENDED_RELEASE_TABLET | Freq: Every day | ORAL | 0 refills | Status: AC
  Filled 2024-09-16: qty 30, 30d supply, fill #0

## 2024-09-23 ENCOUNTER — Other Ambulatory Visit (HOSPITAL_BASED_OUTPATIENT_CLINIC_OR_DEPARTMENT_OTHER): Payer: Self-pay

## 2024-09-30 ENCOUNTER — Encounter: Attending: Physical Medicine & Rehabilitation | Admitting: Psychology

## 2024-09-30 DIAGNOSIS — H53461 Homonymous bilateral field defects, right side: Secondary | ICD-10-CM | POA: Diagnosis present

## 2024-09-30 DIAGNOSIS — S069XAS Unspecified intracranial injury with loss of consciousness status unknown, sequela: Secondary | ICD-10-CM | POA: Insufficient documentation

## 2024-09-30 DIAGNOSIS — F0630 Mood disorder due to known physiological condition, unspecified: Secondary | ICD-10-CM | POA: Insufficient documentation

## 2024-09-30 DIAGNOSIS — S066X1S Traumatic subarachnoid hemorrhage with loss of consciousness of 30 minutes or less, sequela: Secondary | ICD-10-CM | POA: Insufficient documentation

## 2024-09-30 DIAGNOSIS — R4189 Other symptoms and signs involving cognitive functions and awareness: Secondary | ICD-10-CM | POA: Diagnosis present

## 2024-10-07 ENCOUNTER — Encounter: Payer: Self-pay | Admitting: Psychology
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
## 2024-10-12 ENCOUNTER — Other Ambulatory Visit: Payer: Self-pay | Admitting: Physical Medicine & Rehabilitation

## 2024-10-12 DIAGNOSIS — S066X1S Traumatic subarachnoid hemorrhage with loss of consciousness of 30 minutes or less, sequela: Secondary | ICD-10-CM

## 2024-10-13 ENCOUNTER — Other Ambulatory Visit (HOSPITAL_BASED_OUTPATIENT_CLINIC_OR_DEPARTMENT_OTHER): Payer: Self-pay

## 2024-10-13 MED ORDER — METHYLPHENIDATE HCL ER (OSM) 36 MG PO TBCR
36.0000 mg | EXTENDED_RELEASE_TABLET | Freq: Every day | ORAL | 0 refills | Status: AC
  Filled 2024-10-13: qty 30, 30d supply, fill #0
  Filled ????-??-??: fill #0

## 2024-10-14 ENCOUNTER — Other Ambulatory Visit: Payer: Self-pay

## 2024-10-14 ENCOUNTER — Other Ambulatory Visit (HOSPITAL_BASED_OUTPATIENT_CLINIC_OR_DEPARTMENT_OTHER): Payer: Self-pay

## 2024-10-22 ENCOUNTER — Ambulatory Visit: Admitting: Physical Medicine & Rehabilitation

## 2024-10-28 ENCOUNTER — Ambulatory Visit: Admitting: Psychology

## 2024-12-02 ENCOUNTER — Ambulatory Visit: Admitting: Psychology

## 2024-12-09 ENCOUNTER — Encounter: Admitting: Psychology

## 2024-12-30 ENCOUNTER — Ambulatory Visit: Admitting: Psychology

## 2025-01-28 ENCOUNTER — Encounter: Admitting: Psychology

## 2025-02-25 ENCOUNTER — Encounter: Admitting: Psychology
